# Patient Record
Sex: Female | Born: 1940 | Race: White | Hispanic: No | Smoking: Former smoker
Health system: Southern US, Community
[De-identification: ages and names within clinical notes are randomized; demographics above are authoritative.]

## PROBLEM LIST (undated history)

## (undated) DIAGNOSIS — E785 Hyperlipidemia, unspecified: Secondary | ICD-10-CM

## (undated) DIAGNOSIS — I219 Acute myocardial infarction, unspecified: Secondary | ICD-10-CM

## (undated) DIAGNOSIS — E119 Type 2 diabetes mellitus without complications: Secondary | ICD-10-CM

## (undated) DIAGNOSIS — I10 Essential (primary) hypertension: Secondary | ICD-10-CM

## (undated) HISTORY — DX: Type 2 diabetes mellitus without complications: E11.9

## (undated) HISTORY — DX: Acute myocardial infarction, unspecified: I21.9

## (undated) HISTORY — DX: Essential (primary) hypertension: I10

## (undated) HISTORY — PX: CARDIAC SURGERY: SHX584

## (undated) HISTORY — PX: EYE SURGERY: SHX253

## (undated) HISTORY — DX: Hyperlipidemia, unspecified: E78.5

---

## 2014-08-27 DIAGNOSIS — E785 Hyperlipidemia, unspecified: Secondary | ICD-10-CM | POA: Insufficient documentation

## 2014-08-27 DIAGNOSIS — E1169 Type 2 diabetes mellitus with other specified complication: Secondary | ICD-10-CM | POA: Insufficient documentation

## 2014-08-27 DIAGNOSIS — E782 Mixed hyperlipidemia: Secondary | ICD-10-CM | POA: Insufficient documentation

## 2014-08-27 DIAGNOSIS — E1122 Type 2 diabetes mellitus with diabetic chronic kidney disease: Secondary | ICD-10-CM | POA: Insufficient documentation

## 2014-09-09 DIAGNOSIS — G2581 Restless legs syndrome: Secondary | ICD-10-CM | POA: Insufficient documentation

## 2014-10-05 DIAGNOSIS — H9193 Unspecified hearing loss, bilateral: Secondary | ICD-10-CM | POA: Insufficient documentation

## 2014-10-05 DIAGNOSIS — I2510 Atherosclerotic heart disease of native coronary artery without angina pectoris: Secondary | ICD-10-CM | POA: Insufficient documentation

## 2014-10-05 DIAGNOSIS — K219 Gastro-esophageal reflux disease without esophagitis: Secondary | ICD-10-CM | POA: Insufficient documentation

## 2014-11-10 DIAGNOSIS — Z961 Presence of intraocular lens: Secondary | ICD-10-CM | POA: Insufficient documentation

## 2017-06-14 DIAGNOSIS — I739 Peripheral vascular disease, unspecified: Secondary | ICD-10-CM | POA: Insufficient documentation

## 2017-06-17 DIAGNOSIS — Z951 Presence of aortocoronary bypass graft: Secondary | ICD-10-CM | POA: Insufficient documentation

## 2017-06-19 DIAGNOSIS — I255 Ischemic cardiomyopathy: Secondary | ICD-10-CM | POA: Insufficient documentation

## 2017-06-19 DIAGNOSIS — I700 Atherosclerosis of aorta: Secondary | ICD-10-CM | POA: Insufficient documentation

## 2018-08-12 DIAGNOSIS — Z794 Long term (current) use of insulin: Secondary | ICD-10-CM | POA: Diagnosis not present

## 2018-08-12 DIAGNOSIS — I10 Essential (primary) hypertension: Secondary | ICD-10-CM | POA: Diagnosis not present

## 2018-08-12 DIAGNOSIS — H9193 Unspecified hearing loss, bilateral: Secondary | ICD-10-CM | POA: Diagnosis not present

## 2018-08-12 DIAGNOSIS — E119 Type 2 diabetes mellitus without complications: Secondary | ICD-10-CM | POA: Diagnosis not present

## 2018-10-06 DIAGNOSIS — E119 Type 2 diabetes mellitus without complications: Secondary | ICD-10-CM | POA: Diagnosis not present

## 2018-10-06 DIAGNOSIS — I739 Peripheral vascular disease, unspecified: Secondary | ICD-10-CM | POA: Diagnosis not present

## 2018-10-06 DIAGNOSIS — I2510 Atherosclerotic heart disease of native coronary artery without angina pectoris: Secondary | ICD-10-CM | POA: Diagnosis not present

## 2018-10-06 DIAGNOSIS — Z0000 Encounter for general adult medical examination without abnormal findings: Secondary | ICD-10-CM | POA: Diagnosis not present

## 2018-10-06 DIAGNOSIS — E1159 Type 2 diabetes mellitus with other circulatory complications: Secondary | ICD-10-CM | POA: Diagnosis not present

## 2018-12-30 DIAGNOSIS — Z794 Long term (current) use of insulin: Secondary | ICD-10-CM | POA: Diagnosis not present

## 2018-12-30 DIAGNOSIS — E119 Type 2 diabetes mellitus without complications: Secondary | ICD-10-CM | POA: Diagnosis not present

## 2019-01-06 DIAGNOSIS — I700 Atherosclerosis of aorta: Secondary | ICD-10-CM | POA: Diagnosis not present

## 2019-01-06 DIAGNOSIS — Z0000 Encounter for general adult medical examination without abnormal findings: Secondary | ICD-10-CM | POA: Insufficient documentation

## 2019-01-06 DIAGNOSIS — E1122 Type 2 diabetes mellitus with diabetic chronic kidney disease: Secondary | ICD-10-CM | POA: Diagnosis not present

## 2019-01-06 DIAGNOSIS — I10 Essential (primary) hypertension: Secondary | ICD-10-CM | POA: Diagnosis not present

## 2019-01-06 DIAGNOSIS — I2510 Atherosclerotic heart disease of native coronary artery without angina pectoris: Secondary | ICD-10-CM | POA: Diagnosis not present

## 2019-02-13 DIAGNOSIS — Z961 Presence of intraocular lens: Secondary | ICD-10-CM | POA: Diagnosis not present

## 2019-02-13 DIAGNOSIS — E119 Type 2 diabetes mellitus without complications: Secondary | ICD-10-CM | POA: Diagnosis not present

## 2019-04-15 DIAGNOSIS — H905 Unspecified sensorineural hearing loss: Secondary | ICD-10-CM | POA: Diagnosis not present

## 2019-07-07 ENCOUNTER — Encounter: Payer: Medicare Other | Attending: Physician Assistant | Admitting: Physician Assistant

## 2019-07-07 ENCOUNTER — Other Ambulatory Visit: Payer: Self-pay

## 2019-07-07 DIAGNOSIS — Z8249 Family history of ischemic heart disease and other diseases of the circulatory system: Secondary | ICD-10-CM | POA: Insufficient documentation

## 2019-07-07 DIAGNOSIS — Z833 Family history of diabetes mellitus: Secondary | ICD-10-CM | POA: Diagnosis not present

## 2019-07-07 DIAGNOSIS — Z887 Allergy status to serum and vaccine status: Secondary | ICD-10-CM | POA: Diagnosis not present

## 2019-07-07 DIAGNOSIS — Z87891 Personal history of nicotine dependence: Secondary | ICD-10-CM | POA: Insufficient documentation

## 2019-07-07 DIAGNOSIS — Z8349 Family history of other endocrine, nutritional and metabolic diseases: Secondary | ICD-10-CM | POA: Diagnosis not present

## 2019-07-07 DIAGNOSIS — L89312 Pressure ulcer of right buttock, stage 2: Secondary | ICD-10-CM | POA: Insufficient documentation

## 2019-07-07 DIAGNOSIS — I252 Old myocardial infarction: Secondary | ICD-10-CM | POA: Insufficient documentation

## 2019-07-07 DIAGNOSIS — Z880 Allergy status to penicillin: Secondary | ICD-10-CM | POA: Insufficient documentation

## 2019-07-07 DIAGNOSIS — Z823 Family history of stroke: Secondary | ICD-10-CM | POA: Diagnosis not present

## 2019-07-07 DIAGNOSIS — E11622 Type 2 diabetes mellitus with other skin ulcer: Secondary | ICD-10-CM | POA: Insufficient documentation

## 2019-07-07 DIAGNOSIS — I10 Essential (primary) hypertension: Secondary | ICD-10-CM | POA: Diagnosis not present

## 2019-07-07 DIAGNOSIS — M1990 Unspecified osteoarthritis, unspecified site: Secondary | ICD-10-CM | POA: Insufficient documentation

## 2019-07-07 DIAGNOSIS — E1151 Type 2 diabetes mellitus with diabetic peripheral angiopathy without gangrene: Secondary | ICD-10-CM | POA: Insufficient documentation

## 2019-07-07 DIAGNOSIS — I2510 Atherosclerotic heart disease of native coronary artery without angina pectoris: Secondary | ICD-10-CM | POA: Diagnosis not present

## 2019-07-08 NOTE — Progress Notes (Signed)
MARNAE, MADANI ([id number]) Visit Report for 07/07/2019 Allergy List Details Patient Name: Lindsey Pollard, Lindsey Pollard Date of Service: 07/07/2019 1:15 PM Medical Record Number: [id number] Patient Account Number: [id number] Date of Birth/Sex: 1940/09/15 (79 y.o. F) Treating RN: [name] Primary Care Milon Dethloff: [name] Other Clinician: Referring Maguadalupe Lata: Benjamine Sprague Treating Dandrea Medders/Extender: [name], HOYT Weeks in Treatment: 0 Allergies Active Allergies penicillin diphtheria, pertussis, tetanus vaccine Allergy Notes Electronic Signature(s) Signed: 07/08/2019 4:52:26 PM By: Gretta Cool, BSN, RN, CWS, Kim RN, BSN Entered By: Gretta Cool, BSN, RN, CWS, Kim on 07/07/2019 13:18:27 Lindsey Pollard ([id number]) -------------------------------------------------------------------------------- Arrival Information Details Patient Name: Lindsey Pollard Date of Service: 07/07/2019 1:15 PM Medical Record Number: [id number] Patient Account Number: [id number] Date of Birth/Sex: September 26, 1940 (79 y.o. F) Treating RN: Montey Hora Primary Care Jimi Giza: [name] Other Clinician: Referring Rogelio Waynick: Benjamine Sprague Treating Griffon Herberg/Extender: [name], HOYT Weeks in Treatment: 0 Visit Information Patient Arrived: Ambulatory Arrival Time: 13:08 Accompanied By: self Transfer Assistance: None Patient Identification Verified: Yes Secondary Verification Process Completed: Yes Electronic Signature(s) Signed: 07/07/2019 4:10:34 PM By: Lorine Bears RCP, RRT, CHT Entered By: Lorine Bears on 07/07/2019 13:08:25 Lindsey Pollard ([id number]) -------------------------------------------------------------------------------- Clinic Level of Care Assessment Details Patient Name: Lindsey Pollard Date of Service: 07/07/2019 1:15 PM Medical Record Number: [id number] Patient Account Number: [id number] Date of Birth/Sex: 21-Dec-1940 (79 y.o. F) Treating RN: Montey Hora Primary Care  Paislyn Domenico: [name] Other Clinician: Referring Nikolaj Geraghty: Benjamine Sprague Treating Garwood Wentzell/Extender: [name], HOYT Weeks in Treatment: 0 Clinic Level of Care Assessment Items TOOL 2 Quantity Score []  - Use when only an EandM is performed on the INITIAL visit 0 ASSESSMENTS - Nursing Assessment / Reassessment X - General Physical Exam (combine w/ comprehensive assessment (listed just below) when 1 20 performed on new pt. evals) X- 1 25 Comprehensive Assessment (HX, ROS, Risk Assessments, Wounds Hx, etc.) ASSESSMENTS - Wound and Skin Assessment / Reassessment X - Simple Wound Assessment / Reassessment - one wound 1 5 []  - 0 Complex Wound Assessment / Reassessment - multiple wounds []  - 0 Dermatologic / Skin Assessment (not related to wound area) ASSESSMENTS - Ostomy and/or Continence Assessment and Care []  - Incontinence Assessment and Management 0 []  - 0 Ostomy Care Assessment and Management (repouching, etc.) PROCESS - Coordination of Care X - Simple Patient / Family Education for ongoing care 1 15 []  - 0 Complex (extensive) Patient / Family Education for ongoing care X- 1 10 Staff obtains Programmer, systems, Records, Test Results / Process Orders []  - 0 Staff telephones HHA, Nursing Homes / Clarify orders / etc []  - 0 Routine Transfer to another Facility (non-emergent condition) []  - 0 Routine Hospital Admission (non-emergent condition) X- 1 15 New Admissions / Biomedical engineer / Ordering NPWT, Apligraf, etc. []  - 0 Emergency Hospital Admission (emergent condition) X- 1 10 Simple Discharge Coordination []  - 0 Complex (extensive) Discharge Coordination PROCESS - Special Needs []  - Pediatric / Minor Patient Management 0 []  - 0 Isolation Patient Management CICI, RODRIGES. ([id number]) []  - 0 Hearing / Language / Visual special needs []  - 0 Assessment of Community assistance (transportation, D/C planning, etc.) []  - 0 Additional assistance / Altered mentation []   - 0 Support Surface(s) Assessment (bed, cushion, seat, etc.) INTERVENTIONS - Wound Cleansing / Measurement X - Wound Imaging (photographs - any number of wounds) 1 5 []  - 0 Wound Tracing (instead of photographs) X- 1 5 Simple Wound Measurement - one wound []  - 0 Complex Wound Measurement - multiple  wounds X- 1 5 Simple Wound Cleansing - one wound []  - 0 Complex Wound Cleansing - multiple wounds INTERVENTIONS - Wound Dressings X - Small Wound Dressing one or multiple wounds 1 10 []  - 0 Medium Wound Dressing one or multiple wounds []  - 0 Large Wound Dressing one or multiple wounds []  - 0 Application of Medications - injection INTERVENTIONS - Miscellaneous []  - External ear exam 0 []  - 0 Specimen Collection (cultures, biopsies, blood, body fluids, etc.) []  - 0 Specimen(s) / Culture(s) sent or taken to Lab for analysis []  - 0 Patient Transfer (multiple staff / / Similar devices) []  - 0 Simple Staple / Suture removal (25 or less) []  - 0 Complex Staple / Suture removal (26 or more) []  - 0 Hypo / Hyperglycemic Management (close monitor of Blood Glucose) []  - 0 Ankle / Brachial Index (ABI) - do not check if billed separately Has the patient been seen at the hospital within the last three years: Yes Total Score: 125 Level Of Care: New/Established - Level 4 Electronic Signature(s) Signed: 07/07/2019 4:09:10 PM By: Entered By: on 07/07/2019 13:37:01 ( ) -------------------------------------------------------------------------------- Encounter Discharge Information Details Patient Name: Date of Service: 07/07/2019 1:15 PM Medical Record Number: Patient Account Number: Date of Birth/Sex: 1941/05/08 (79 y.o. F) Treating RN: 09/04/2019 Primary Care Zia Najera: Curtis Sites Other Clinician: Referring Miasia Crabtree: Curtis Sites Treating Whitleigh Garramone/Extender: 09/04/2019, HOYT Weeks  in Treatment: 0 Encounter Discharge Information Items Post Procedure Vitals Discharge Condition: Stable Temperature (F): 98.4 Ambulatory Status: Ambulatory Pulse (bpm): 58 Discharge Destination: Home Respiratory Rate (breaths/min): 16 Transportation: Private Auto Blood Pressure (mmHg): 180/68 Accompanied By: self Schedule Follow-up Appointment: Yes Clinical Summary of Care: Electronic Signature(s) Signed: 07/07/2019 4:09:10 PM By: [id number] Entered By: Lindsey Pollard on 07/07/2019 13:38:22 [id number] ([id number]) -------------------------------------------------------------------------------- Lower Extremity Assessment Details Patient Name: Lindsey Pollard Date of Service: 07/07/2019 1:15 PM Medical Record Number: Curtis Sites Patient Account Number: Einar Crow Date of Birth/Sex: December 23, 1940 (79 y.o. F) Treating RN: 09/04/2019 Primary Care Jodie Cavey: Curtis Sites Other Clinician: Referring Eaven Schwager: Curtis Sites Treating Carle Dargan/Extender: 09/04/2019, HOYT Weeks in Treatment: 0 Electronic Signature(s) Signed: 07/08/2019 4:52:26 PM By: [id number], BSN, RN, CWS, Kim RN, BSN Entered By: Lindsey Pollard, BSN, RN, CWS, Kim on 07/07/2019 13:17:24 [id number] ([id number]) -------------------------------------------------------------------------------- Multi Wound Chart Details Patient Name: Lindsey Pollard Date of Service: 07/07/2019 1:15 PM Medical Record Number: Huel Coventry Patient Account Number: Einar Crow Date of Birth/Sex: [birth year] (79 y.o. F) Treating RN: 07/10/2019 Primary Care Cloyd Ragas: Elliot Gurney Other Clinician: Referring Ariday Brinker: Elliot Gurney Treating Cochise Dinneen/Extender: 09/04/2019, HOYT Weeks in Treatment: 0 Vital Signs Height(in): 59 Pulse(bpm): 58 Weight(lbs): 165 Blood Pressure(mmHg): 180/68 Body Mass Index(BMI): 33 Temperature(F): 98.4 Respiratory Rate 16 (breaths/min): Photos: [N/A:N/A] Wound Location: Right Gluteal fold N/A N/A Wounding Event:  Gradually Appeared N/A N/A Primary Etiology: Pressure Ulcer N/A N/A Date Acquired: 06/05/2019 N/A N/A Weeks of Treatment: 0 N/A N/A Wound Status: Open N/A N/A Measurements L x W x D 0.4x0.3x0.1 N/A N/A (cm) Area (cm) : 0.094 N/A N/A Volume (cm) : 0.009 N/A N/A % Reduction in Area: 0.00% N/A N/A % Reduction in Volume: 0.00% N/A N/A Classification: Category/Stage II N/A N/A Exudate Amount: Medium N/A N/A Exudate Type: Serous N/A N/A Exudate Color: amber N/A N/A Wound Margin: Flat and Intact N/A N/A Granulation Amount: Large (67-100%) N/A N/A Necrotic Amount: None Present (0%) N/A N/A Exposed Structures: Fat Layer (Subcutaneous N/A  N/A Tissue) Exposed: Yes Fascia: No Tendon: No Muscle: No Joint: No Bone: No Epithelialization: None N/A N/A Treatment Notes EUSEBIA, GRULKE ([id number]) Electronic Signature(s) Signed: 07/07/2019 4:09:10 PM By: Curtis Sites Entered By: Curtis Sites on 07/07/2019 13:34:06 Lindsey Pollard ([id number]) -------------------------------------------------------------------------------- Multi-Disciplinary Care Plan Details Patient Name: Lindsey Pollard Date of Service: 07/07/2019 1:15 PM Medical Record Number: [id number] Patient Account Number: [id number] Date of Birth/Sex: 01/08/1941 (79 y.o. F) Treating RN: Curtis Sites Primary Care Katrinka Herbison: Einar Crow Other Clinician: Referring Petrice Beedy: Sung Amabile Treating Tasheika Kitzmiller/Extender: Linwood Dibbles, HOYT Weeks in Treatment: 0 Active Inactive Abuse / Safety / Falls / Self Care Management Nursing Diagnoses: Potential for falls Goals: Patient will remain injury free related to falls Date Initiated: 07/07/2019 Target Resolution Date: 10/03/2019 Goal Status: Active Interventions: Assess fall risk on admission and as needed Notes: Orientation to the Wound Care Program Nursing Diagnoses: Knowledge deficit related to the wound healing center program Goals: Patient/caregiver will verbalize  understanding of the Wound Healing Center Program Date Initiated: 07/07/2019 Target Resolution Date: 10/03/2019 Goal Status: Active Interventions: Provide education on orientation to the wound center Notes: Pressure Nursing Diagnoses: Knowledge deficit related to causes and risk factors for pressure ulcer development Goals: Patient will remain free from development of additional pressure ulcers Date Initiated: 07/07/2019 Target Resolution Date: 10/03/2019 Goal Status: Active Interventions: Assess potential for pressure ulcer upon admission and as needed ADILYNN, BESSEY. ([id number]) Provide education on pressure ulcers Notes: Wound/Skin Impairment Nursing Diagnoses: Impaired tissue integrity Goals: Ulcer/skin breakdown will heal within 14 weeks Date Initiated: 07/07/2019 Target Resolution Date: 10/03/2019 Goal Status: Active Interventions: Assess patient/caregiver ability to obtain necessary supplies Assess patient/caregiver ability to perform ulcer/skin care regimen upon admission and as needed Assess ulceration(s) every visit Notes: Electronic Signature(s) Signed: 07/07/2019 4:09:10 PM By: Curtis Sites Entered By: Curtis Sites on 07/07/2019 13:33:55 Lindsey Pollard ([id number]) -------------------------------------------------------------------------------- Pain Assessment Details Patient Name: Lindsey Pollard Date of Service: 07/07/2019 1:15 PM Medical Record Number: [id number] Patient Account Number: [id number] Date of Birth/Sex: 1941/01/18 (79 y.o. F) Treating RN: Curtis Sites Primary Care Adyan Palau: Einar Crow Other Clinician: Referring Joss Friedel: Sung Amabile Treating Taiquan Campanaro/Extender: Linwood Dibbles, HOYT Weeks in Treatment: 0 Active Problems Location of Pain Severity and Description of Pain Patient Has Paino No Site Locations Pain Management and Medication Current Pain Management: Electronic Signature(s) Signed: 07/07/2019 4:09:10 PM By: Curtis Sites Signed: 07/07/2019 4:10:34 PM By: Dayton Martes RCP, RRT, CHT Entered By: Dayton Martes on 07/07/2019 13:08:37 Lindsey Pollard ([id number]) -------------------------------------------------------------------------------- Patient/Caregiver Education Details Patient Name: Lindsey Pollard Date of Service: 07/07/2019 1:15 PM Medical Record Number: [id number] Patient Account Number: [id number] Date of Birth/Gender: Nov 09, 1940 (79 y.o. F) Treating RN: Curtis Sites Primary Care Physician: Einar Crow Other Clinician: Referring Physician: Sung Amabile Treating Physician/Extender: Skeet Simmer in Treatment: 0 Education Assessment Education Provided To: Patient Education Topics Provided Wound/Skin Impairment: Handouts: Other: wound care as ordered Methods: Demonstration, Explain/Verbal Responses: State content correctly Electronic Signature(s) Signed: 07/07/2019 4:09:10 PM By: Curtis Sites Entered By: Curtis Sites on 07/07/2019 13:37:26 Lindsey Pollard ([id number]) -------------------------------------------------------------------------------- Wound Assessment Details Patient Name: Lindsey Pollard Date of Service: 07/07/2019 1:15 PM Medical Record Number: [id number] Patient Account Number: [id number] Date of Birth/Sex: 1940-12-13 (79 y.o. F) Treating RN: Huel Coventry Primary Care Olga Seyler: Einar Crow Other Clinician: Referring Chanel Mcadams: Sung Amabile Treating Aquiles Ruffini/Extender: Linwood Dibbles, HOYT Weeks in Treatment: 0 Wound Status Wound Number: 1 Primary Etiology: Pressure Ulcer Wound Location: Right Gluteal fold Wound Status: Open  Wounding Event: Gradually Appeared Date Acquired: 06/05/2019 Weeks Of Treatment: 0 Clustered Wound: No Photos Wound Measurements Length: (cm) 0.4 % Reduction in Width: (cm) 0.3 % Reduction in Depth: (cm) 0.1 Epithelializati Area: (cm) 0.094 Tunneling: Volume: (cm) 0.009 Undermining: Area:  0% Volume: 0% on: None No No Wound Description Classification: Category/Stage II Foul Odor Afte Wound Margin: Flat and Intact Slough/Fibrino Exudate Amount: Medium Exudate Type: Serous Exudate Color: amber r Cleansing: No No Wound Bed Granulation Amount: Large (67-100%) Exposed Structure Necrotic Amount: None Present (0%) Fascia Exposed: No Fat Layer (Subcutaneous Tissue) Exposed: Yes Tendon Exposed: No Muscle Exposed: No Joint Exposed: No Bone Exposed: No Treatment Notes ALEINA, BURGIO ([id number]) Wound #1 (Right Gluteal fold) Notes prisma, BFD Electronic Signature(s) Signed: 07/08/2019 4:52:26 PM By: Elliot Gurney, BSN, RN, CWS, Kim RN, BSN Entered By: Elliot Gurney, BSN, RN, CWS, Kim on 07/07/2019 13:17:08 Lindsey Pollard ([id number]) -------------------------------------------------------------------------------- Vitals Details Patient Name: Lindsey Pollard Date of Service: 07/07/2019 1:15 PM Medical Record Number: [id number] Patient Account Number: [id number] Date of Birth/Sex: [birth year] (79 y.o. F) Treating RN: Curtis Sites Primary Care Naylea Wigington: Einar Crow Other Clinician: Referring Shasha Buchbinder: Sung Amabile Treating Amitai Delaughter/Extender: Linwood Dibbles, HOYT Weeks in Treatment: 0 Vital Signs Time Taken: 13:08 Temperature (F): 98.4 Height (in): 59 Pulse (bpm): 58 Source: Stated Respiratory Rate (breaths/min): 16 Weight (lbs): 165 Blood Pressure (mmHg): 180/68 Source: Measured Reference Range: 80 - 120 mg / dl Body Mass Index (BMI): 33.3 Electronic Signature(s) Signed: 07/07/2019 4:10:34 PM By: Dayton Martes RCP, RRT, CHT Entered By: Dayton Martes on 07/07/2019 13:12:31

## 2019-07-08 NOTE — Progress Notes (Signed)
KENEDIE, DIROCCO ([id number]) Visit Report for 07/07/2019 Abuse/Suicide Risk Screen Details Patient Name: Lindsey Pollard, Lindsey Pollard Date of Service: 07/07/2019 1:15 PM Medical Record Number: [id number] Patient Account Number: [id number] Date of Birth/Sex: 07-11-1940 (78 y.o. F) Treating RN: Huel Coventry Primary Care Kaimen Peine: Einar Crow Other Clinician: Referring Tandrea Kommer: Sung Amabile Treating Martise Waddell/Extender: Linwood Dibbles, HOYT Weeks in Treatment: 0 Abuse/Suicide Risk Screen Items Answer ABUSE RISK SCREEN: Has anyone close to you tried to hurt or harm you recentlyo No Do you feel uncomfortable with anyone in your familyo No Has anyone forced you do things that you didnot want to doo No Electronic Signature(s) Signed: 07/08/2019 4:52:26 PM By: Elliot Gurney, BSN, RN, CWS, Kim RN, BSN Entered By: Elliot Gurney, BSN, RN, CWS, Kim on 07/07/2019 13:22:32 Lindsey Pollard ([id number]) -------------------------------------------------------------------------------- Activities of Daily Living Details Patient Name: Lindsey Pollard Date of Service: 07/07/2019 1:15 PM Medical Record Number: [id number] Patient Account Number: [id number] Date of Birth/Sex: [birth year] (78 y.o. F) Treating RN: Huel Coventry Primary Care Elmond Poehlman: Einar Crow Other Clinician: Referring Malasia Torain: Sung Amabile Treating Torrance Frech/Extender: Linwood Dibbles, HOYT Weeks in Treatment: 0 Activities of Daily Living Items Answer Activities of Daily Living (Please select one for each item) Drive Automobile Not Able Take Medications Completely Able Use Telephone Completely Able Care for Appearance Completely Able Use Toilet Completely Able Bath / Shower Completely Able Dress Self Completely Able Feed Self Completely Able Walk Completely Able Get In / Out Bed Completely Able Housework Need Assistance Prepare Meals Completely Able Handle Money Completely Able Shop for Self Completely Able Electronic Signature(s) Signed: 07/08/2019 4:52:26 PM By:  Elliot Gurney, BSN, RN, CWS, Kim RN, BSN Entered By: Elliot Gurney, BSN, RN, CWS, Kim on 07/07/2019 13:22:44 Lindsey Pollard ([id number]) -------------------------------------------------------------------------------- Education Screening Details Patient Name: Lindsey Pollard Date of Service: 07/07/2019 1:15 PM Medical Record Number: [id number] Patient Account Number: [id number] Date of Birth/Sex: December 29, 1940 (78 y.o. F) Treating RN: Huel Coventry Primary Care Keysha Damewood: Einar Crow Other Clinician: Referring Selah Klang: Sung Amabile Treating Davionne Mastrangelo/Extender: Linwood Dibbles, HOYT Weeks in Treatment: 0 Primary Learner Assessed: Patient Learning Preferences/Education Level/Primary Language Learning Preference: Explanation Highest Education Level: Grade School Preferred Language: English Cognitive Barrier Language Barrier: No Translator Needed: No Memory Deficit: No Emotional Barrier: No Cultural/Religious Beliefs Affecting Medical Care: No Physical Barrier Impaired Vision: No Impaired Hearing: No Decreased Hand dexterity: No Knowledge/Comprehension Knowledge Level: High Comprehension Level: High Ability to understand written High instructions: Ability to understand verbal High instructions: Motivation Anxiety Level: Calm Cooperation: Cooperative Education Importance: Acknowledges Need Interest in Health Problems: Asks Questions Perception: Coherent Willingness to Engage in Self- High Management Activities: Readiness to Engage in Self- High Management Activities: Electronic Signature(s) Signed: 07/08/2019 4:52:26 PM By: Elliot Gurney, BSN, RN, CWS, Kim RN, BSN Entered By: Elliot Gurney, BSN, RN, CWS, Kim on 07/07/2019 13:23:01 Lindsey Pollard ([id number]) -------------------------------------------------------------------------------- Fall Risk Assessment Details Patient Name: Lindsey Pollard Date of Service: 07/07/2019 1:15 PM Medical Record Number: [id number] Patient Account Number: [id number] Date of  Birth/Sex: 27-Sep-1940 (78 y.o. F) Treating RN: Huel Coventry Primary Care Veronika Heard: Einar Crow Other Clinician: Referring Celestino Ackerman: Sung Amabile Treating Shaye Elling/Extender: Linwood Dibbles, HOYT Weeks in Treatment: 0 Fall Risk Assessment Items Have you had 2 or more falls in the last 12 monthso 0 No Have you had any fall that resulted in injury in the last 12 monthso 0 No FALLS RISK SCREEN History of falling - immediate or within 3 months 0 No Secondary diagnosis (Do you have 2 or more medical diagnoseso) 0 No Ambulatory aid None/bed  rest/wheelchair/nurse 0 No Crutches/cane/walker 0 No Furniture 0 No Intravenous therapy Access/Saline/Heparin Lock 0 No Gait/Transferring Normal/ bed rest/ wheelchair 0 No Weak (short steps with or without shuffle, stooped but able to lift head while 0 No walking, may seek support from furniture) Impaired (short steps with shuffle, may have difficulty arising from chair, head 0 No down, impaired balance) Mental Status Oriented to own ability 0 No Electronic Signature(s) Signed: 07/08/2019 4:52:26 PM By: Gretta Cool, BSN, RN, CWS, Kim RN, BSN Entered By: Gretta Cool, BSN, RN, CWS, Kim on 07/07/2019 13:23:08 Lindsey Pollard ([id number]) -------------------------------------------------------------------------------- Nutrition Risk Screening Details Patient Name: Lindsey Pollard Date of Service: 07/07/2019 1:15 PM Medical Record Number: [id number] Patient Account Number: [id number] Date of Birth/Sex: 1941-05-17 (78 y.o. F) Treating RN: [name] Primary Care Keylani Perlstein: [name] Other Clinician: Referring Caison Hearn: Benjamine Sprague Treating Talayia Hjort/Extender: [name], HOYT Weeks in Treatment: 0 Height (in): 59 Weight (lbs): 165 Body Mass Index (BMI): 33.3 Nutrition Risk Screening Items Score Screening NUTRITION RISK SCREEN: I have an illness or condition that made me change the kind and/or amount of 0 No food I eat I eat fewer than two meals per day 0  No I eat few fruits and vegetables, or milk products 0 No I have three or more drinks of beer, liquor or wine almost every day 0 No I have tooth or mouth problems that make it hard for me to eat 0 No I don't always have enough money to buy the food I need 0 No I eat alone most of the time 0 No I take three or more different prescribed or over-the-counter drugs a day 0 No Without wanting to, I have lost or gained 10 pounds in the last six months 0 No I am not always physically able to shop, cook and/or feed myself 0 No Nutrition Protocols Good Risk Protocol 0 No interventions needed Moderate Risk Protocol High Risk Proctocol Risk Level: Good Risk Score: 0 Electronic Signature(s) Signed: 07/08/2019 4:52:26 PM By: Gretta Cool, BSN, RN, CWS, Kim RN, BSN Entered By: Gretta Cool, BSN, RN, CWS, Kim on 07/07/2019 13:23:13

## 2019-07-08 NOTE — Progress Notes (Signed)
Lindsey Pollard, Lindsey M. ([id number]) Visit Report for 07/07/2019 Chief Complaint Document Details Patient Name: Lindsey Pollard, Lindsey M. Date of Service: 07/07/2019 1:15 PM Medical Record Number: [id number] Patient Account Number: [id number] Date of Birth/Sex: December 22, 1940 (78 y.o. F) Treating RN: Curtis Sitesorthy, Joanna Primary Care Provider: Einar CrowAnderson, Marshall Other Clinician: Referring Provider: Sung AmabileSAKAI, ISAMI Treating Provider/Extender: Linwood DibblesSTONE III, Kingsley Herandez Weeks in Treatment: 0 Information Obtained from: Patient Chief Complaint Right gluteal fold pressure ulcer Electronic Signature(s) Signed: 07/07/2019 1:39:34 PM By: Lenda KelpStone III, Dessiree Sze PA-C Previous Signature: 07/07/2019 1:25:55 PM Version By: Lenda KelpStone III, Lateya Dauria PA-C Entered By: Lenda KelpStone III, Acacia Latorre on 07/07/2019 13:39:34 Lindsey Pollard, Lindsey M. ([id number]) -------------------------------------------------------------------------------- Debridement Details Patient Name: Lindsey Pollard, Lindsey M. Date of Service: 07/07/2019 1:15 PM Medical Record Number: [id number] Patient Account Number: [id number] Date of Birth/Sex: December 22, 1940 (78 y.o. F) Treating RN: Curtis Sitesorthy, Joanna Primary Care Provider: Einar CrowAnderson, Marshall Other Clinician: Referring Provider: Sung AmabileSAKAI, ISAMI Treating Provider/Extender: Linwood DibblesSTONE III, Jupiter Kabir Weeks in Treatment: 0 Debridement Performed for Wound #1 Right Gluteal fold Assessment: Performed By: Physician [name], Daleigh Pollinger E., PA-C Debridement Type: Chemical/Enzymatic/Mechanical Agent Used: saline and gauze Level of Consciousness (Pre- Awake and Alert procedure): Pre-procedure Verification/Time Yes - 13:35 Out Taken: Start Time: 13:35 Pain Control: Lidocaine 4% Topical Solution Instrument: Other : saline and gauze Bleeding: None End Time: 13:36 Procedural Pain: 0 Post Procedural Pain: 0 Response to Treatment: Procedure was tolerated well Level of Consciousness Awake and Alert (Post-procedure): Post Debridement Measurements of Total Wound Length: (cm) 0.4 Stage:  Category/Stage II Width: (cm) 0.3 Depth: (cm) 0.1 Volume: (cm) 0.009 Character of Wound/Ulcer Post Improved Debridement: Post Procedure Diagnosis Same as Pre-procedure Electronic Signature(s) Signed: 07/07/2019 4:01:12 PM By: Lenda KelpStone III, Lelynd Poer PA-C Signed: 07/07/2019 4:09:10 PM By: Curtis Sitesorthy, Joanna Entered By: Curtis Sitesorthy, Joanna on 07/07/2019 13:35:36 Lindsey Pollard, Lindsey M. ([id number]) -------------------------------------------------------------------------------- HPI Details Patient Name: Lindsey Pollard, Lindsey M. Date of Service: 07/07/2019 1:15 PM Medical Record Number: [id number] Patient Account Number: [id number] Date of Birth/Sex: December 22, 1940 (78 y.o. F) Treating RN: Curtis Sitesorthy, Joanna Primary Care Provider: Einar CrowAnderson, Marshall Other Clinician: Referring Provider: Sung AmabileSAKAI, ISAMI Treating Provider/Extender: Linwood DibblesSTONE III, Geneal Huebert Weeks in Treatment: 0 History of Present Illness HPI Description: 05/06/2020 on evaluation today patient presents for initial inspection here in our clinic concerning an issue that she is having with a wound in the right gluteal fold region. She has been tolerating some dressing changes she has been performing at home mainly she been putting a little bit of ointment on this and keeping it covered. She notes that this has been present for about 2 months and she is noted some improvement compared to where it started but unfortunately not a significant amount to where she is pleased with how things have gone. Nonetheless she did come in to be seen in order to see if there is anything we can do to aid in helping the area to heal more effectively and quickly. Fortunately there is no signs of active infection at this time. No fevers, chills, nausea, vomiting, or diarrhea. She has diabetic and on 05/04/19 her most recent reading of 7.9. She also has a history of heart disease of note. She also has hypertension. Her blood pressure is elevated today as well. She follows up with her primary care provider  on a regular basis in regard to this. Electronic Signature(s) Signed: 07/07/2019 2:41:22 PM By: Lenda KelpStone III, Honesty Menta PA-C Previous Signature: 07/07/2019 1:40:48 PM Version By: Lenda KelpStone III, Bassam Dresch PA-C Entered By: Lenda KelpStone III, Parisha Beaulac on 07/07/2019 14:41:22 Lindsey Pollard, Lindsey M. ([id number]) -------------------------------------------------------------------------------- Physical Exam Details Patient Name: Lindsey Pollard, Lindsey  M. Date of Service: 07/07/2019 1:15 PM Medical Record Number: [id number] Patient Account Number: [id number] Date of Birth/Sex: 1940/12/08 (78 y.o. F) Treating RN: Curtis Sites Primary Care Provider: Einar Crow Other Clinician: Referring Provider: Sung Amabile Treating Provider/Extender: Linwood Dibbles, Lajoya Dombek Weeks in Treatment: 0 Constitutional patient is hypertensive.. pulse regular and within target range for patient.Marland Kitchen respirations regular, non-labored and within target range for patient.Marland Kitchen temperature within target range for patient.. Well-nourished and well-hydrated in no acute distress. Eyes conjunctiva clear no eyelid edema noted. pupils equal round and reactive to light and accommodation. Ears, Nose, Mouth, and Throat no gross abnormality of ear auricles or external auditory canals. normal hearing noted during conversation. mucus membranes moist. Respiratory normal breathing without difficulty. Cardiovascular 2+ dorsalis pedis/posterior tibialis pulses. no clubbing, cyanosis, significant edema, <3 sec cap refill. Gastrointestinal (GI) soft, non-tender, non-distended, +BS. no ventral hernia noted. Musculoskeletal normal gait and posture. no significant deformity or arthritic changes, no loss or range of motion, no clubbing. Psychiatric this patient is able to make decisions and demonstrates good insight into disease process. Alert and Oriented x 3. pleasant and cooperative. Notes Upon inspection today patient's wound bed actually showed signs of fairly good granulation. There does  not appear to be any evidence of active infection at this time which is good news. Unfortunately the patient's blood pressure was quite elevated at 180/68. Nonetheless I do believe that this is something that she needs to continue to follow with her primary care provider they have been working she tells me to get this under control. Nonetheless obviously they need to definitely make this a priority and focus. I do believe that is the case already but nonetheless that was reiterated to the patient as well today. Electronic Signature(s) Signed: 07/07/2019 1:41:32 PM By: Lenda Kelp PA-C Entered By: Lenda Kelp on 07/07/2019 13:41:32 Lindsey Pollard ([id number]) -------------------------------------------------------------------------------- Physician Orders Details Patient Name: Lindsey Pollard Date of Service: 07/07/2019 1:15 PM Medical Record Number: [id number] Patient Account Number: [id number] Date of Birth/Sex: 09-30-1940 (78 y.o. F) Treating RN: Curtis Sites Primary Care Provider: Einar Crow Other Clinician: Referring Provider: Sung Amabile Treating Provider/Extender: Linwood Dibbles, Phuc Kluttz Weeks in Treatment: 0 Verbal / Phone Orders: No Diagnosis Coding ICD-10 Coding Code Description L89.312 Pressure ulcer of right buttock, stage 2 E11.622 Type 2 diabetes mellitus with other skin ulcer I10 Essential (primary) hypertension I25.10 Atherosclerotic heart disease of native coronary artery without angina pectoris Wound Cleansing Wound #1 Right Gluteal fold o Clean wound with Normal Saline. o Dial antibacterial soap, wash wounds, rinse and pat dry prior to dressing wounds o May Shower, gently pat wound dry prior to applying new dressing. Skin Barriers/Peri-Wound Care Wound #1 Right Gluteal fold o Skin Prep Primary Wound Dressing Wound #1 Right Gluteal fold o Silver Collagen Secondary Dressing Wound #1 Right Gluteal fold o Boardered Foam Dressing Dressing  Change Frequency Wound #1 Right Gluteal fold o Change dressing every other day. Follow-up Appointments o Return Appointment in 1 week. Off-Loading Wound #1 Right Gluteal fold o Turn and reposition every 2 hours Electronic Signature(s) Signed: 07/07/2019 4:01:12 PM By: Lenda Kelp PA-C Signed: 07/07/2019 4:09:10 PM By: Leontine Locket ([id number]) Entered By: Curtis Sites on 07/07/2019 13:36:37 Lindsey Pollard ([id number]) -------------------------------------------------------------------------------- Problem List Details Patient Name: Lindsey Pollard Date of Service: 07/07/2019 1:15 PM Medical Record Number: [id number] Patient Account Number: [id number] Date of Birth/Sex: 14-Jul-1940 (78 y.o. F) Treating RN: Curtis Sites Primary Care Provider: Einar Crow Other Clinician: Referring  Provider: Sung AmabileSAKAI, ISAMI Treating Provider/Extender: Linwood DibblesSTONE III, Vencil Basnett Weeks in Treatment: 0 Active Problems ICD-10 Evaluated Encounter Code Description Active Date Today Diagnosis L89.312 Pressure ulcer of right buttock, stage 2 07/07/2019 No Yes E11.622 Type 2 diabetes mellitus with other skin ulcer 07/07/2019 No Yes I10 Essential (primary) hypertension 07/07/2019 No Yes I25.10 Atherosclerotic heart disease of native coronary artery 07/07/2019 No Yes without angina pectoris Inactive Problems Resolved Problems Electronic Signature(s) Signed: 07/07/2019 1:25:20 PM By: Lenda KelpStone III, Channah Godeaux PA-C Entered By: Lenda KelpStone III, Shaneta Cervenka on 07/07/2019 13:25:20 Lindsey Pollard, Libbie M. ([id number]) -------------------------------------------------------------------------------- Progress Note Details Patient Name: Lindsey Pollard, Shelbi M. Date of Service: 07/07/2019 1:15 PM Medical Record Number: [id number] Patient Account Number: [id number] Date of Birth/Sex: 1940-12-01 (78 y.o. F) Treating RN: Curtis Sitesorthy, Joanna Primary Care Provider: Einar CrowAnderson, Marshall Other Clinician: Referring Provider: Sung AmabileSAKAI, ISAMI Treating  Provider/Extender: Linwood DibblesSTONE III, Antonea Gaut Weeks in Treatment: 0 Subjective Chief Complaint Information obtained from Patient Right gluteal fold pressure ulcer History of Present Illness (HPI) 05/06/2020 on evaluation today patient presents for initial inspection here in our clinic concerning an issue that she is having with a wound in the right gluteal fold region. She has been tolerating some dressing changes she has been performing at home mainly she been putting a little bit of ointment on this and keeping it covered. She notes that this has been present for about 2 months and she is noted some improvement compared to where it started but unfortunately not a significant amount to where she is pleased with how things have gone. Nonetheless she did come in to be seen in order to see if there is anything we can do to aid in helping the area to heal more effectively and quickly. Fortunately there is no signs of active infection at this time. No fevers, chills, nausea, vomiting, or diarrhea. She has diabetic and on 05/04/19 her most recent reading of 7.9. She also has a history of heart disease of note. She also has hypertension. Her blood pressure is elevated today as well. She follows up with her primary care provider on a regular basis in regard to this. Patient History Information obtained from Patient. Allergies penicillin, diphtheria, pertussis, tetanus vaccine Family History Diabetes - Father, Heart Disease - Father,Mother, Hypertension - Father,Mother, Stroke - Father,Siblings, Thyroid Problems - Mother, No family history of Cancer, Hereditary Spherocytosis, Kidney Disease, Lung Disease, Seizures, Tuberculosis. Social History Former smoker - 20 years, Marital Status - Divorced, Alcohol Use - Never, Drug Use - No History, Caffeine Use - Never. Medical History Eyes Patient has history of Cataracts - removed Denies history of Glaucoma, Optic Neuritis Cardiovascular Patient has history of  Hypertension, Myocardial Infarction Denies history of Angina, Arrhythmia, Congestive Heart Failure, Coronary Artery Disease, Deep Vein Thrombosis, Hypotension, Peripheral Arterial Disease, Peripheral Venous Disease, Phlebitis, Vasculitis Endocrine Patient has history of Type II Diabetes Denies history of Type I Diabetes Integumentary (Skin) Denies history of History of Burn, History of pressure wounds Musculoskeletal Patient has history of Osteoarthritis Lindsey Pollard, Timya M. ([id number]) Denies history of Gout, Rheumatoid Arthritis, Osteomyelitis Patient is treated with Insulin. Review of Systems (ROS) Constitutional Symptoms (General Health) Denies complaints or symptoms of Fatigue, Fever, Chills, Marked Weight Change. Eyes Denies complaints or symptoms of Dry Eyes, Vision Changes, Glasses / Contacts. Ear/Nose/Mouth/Throat Denies complaints or symptoms of Difficult clearing ears, Sinusitis. Hematologic/Lymphatic Denies complaints or symptoms of Bleeding / Clotting Disorders, Human Immunodeficiency Virus. Respiratory Denies complaints or symptoms of Chronic or frequent coughs, Shortness of Breath. Cardiovascular Denies complaints or symptoms of Chest pain,  LE edema. Gastrointestinal Denies complaints or symptoms of Frequent diarrhea, Nausea, Vomiting. Genitourinary Denies complaints or symptoms of Kidney failure/ Dialysis, Incontinence/dribbling. Immunological Denies complaints or symptoms of Hives, Itching. Integumentary (Skin) Complains or has symptoms of Wounds. Denies complaints or symptoms of Bleeding or bruising tendency, Breakdown, Swelling. Musculoskeletal Denies complaints or symptoms of Muscle Pain, Muscle Weakness. Neurologic Denies complaints or symptoms of Numbness/parasthesias, Focal/Weakness. Psychiatric Denies complaints or symptoms of Anxiety, Claustrophobia. Objective Constitutional patient is hypertensive.. pulse regular and within target range for patient.Marland Kitchen  respirations regular, non-labored and within target range for patient.Marland Kitchen temperature within target range for patient.. Well-nourished and well-hydrated in no acute distress. Vitals Time Taken: 1:08 PM, Height: 59 in, Source: Stated, Weight: 165 lbs, Source: Measured, BMI: 33.3, Temperature: 98.4 F, Pulse: 58 bpm, Respiratory Rate: 16 breaths/min, Blood Pressure: 180/68 mmHg. Eyes conjunctiva clear no eyelid edema noted. pupils equal round and reactive to light and accommodation. Ears, Nose, Mouth, and Throat no gross abnormality of ear auricles or external auditory canals. normal hearing noted during conversation. mucus membranes moist. Respiratory Lindsey Pollard, Jazzmen M. ([id number]) normal breathing without difficulty. Cardiovascular 2+ dorsalis pedis/posterior tibialis pulses. no clubbing, cyanosis, significant edema, Gastrointestinal (GI) soft, non-tender, non-distended, +BS. no ventral hernia noted. Musculoskeletal normal gait and posture. no significant deformity or arthritic changes, no loss or range of motion, no clubbing. Psychiatric this patient is able to make decisions and demonstrates good insight into disease process. Alert and Oriented x 3. pleasant and cooperative. General Notes: Upon inspection today patient's wound bed actually showed signs of fairly good granulation. There does not appear to be any evidence of active infection at this time which is good news. Unfortunately the patient's blood pressure was quite elevated at 180/68. Nonetheless I do believe that this is something that she needs to continue to follow with her primary care provider they have been working she tells me to get this under control. Nonetheless obviously they need to definitely make this a priority and focus. I do believe that is the case already but nonetheless that was reiterated to the patient as well today. Integumentary (Hair, Skin) Wound #1 status is Open. Original cause of wound was Gradually  Appeared. The wound is located on the Right Gluteal fold. The wound measures 0.4cm length x 0.3cm width x 0.1cm depth; 0.094cm^2 area and 0.009cm^3 volume. There is Fat Layer (Subcutaneous Tissue) Exposed exposed. There is no tunneling or undermining noted. There is a medium amount of serous drainage noted. The wound margin is flat and intact. There is large (67-100%) granulation within the wound bed. There is no necrotic tissue within the wound bed. Assessment Active Problems ICD-10 Pressure ulcer of right buttock, stage 2 Type 2 diabetes mellitus with other skin ulcer Essential (primary) hypertension Atherosclerotic heart disease of native coronary artery without angina pectoris Procedures Wound #1 Pre-procedure diagnosis of Wound #1 is a Pressure Ulcer located on the Right Gluteal fold . There was a Chemical/Enzymatic/Mechanical debridement performed by [name], Alawna Graybeal E., PA-C. With the following instrument(s): saline and gauze after achieving pain control using Lidocaine 4% Topical Solution. Other agent used was saline and gauze. A time out was conducted at 13:35, prior to the start of the procedure. There was no bleeding. The procedure was tolerated well with a pain level of 0 throughout and a pain level of 0 following the procedure. Post Debridement Measurements: 0.4cm length x 0.3cm width x 0.1cm depth; 0.009cm^3 volume. Post debridement Stage noted as Category/Stage II. Character of Wound/Ulcer Post Debridement is  improved. Post procedure Diagnosis Wound #1: Same as Pre-Procedure JEYMI, HEPP. ([id number]) Plan Wound Cleansing: Wound #1 Right Gluteal fold: Clean wound with Normal Saline. Dial antibacterial soap, wash wounds, rinse and pat dry prior to dressing wounds May Shower, gently pat wound dry prior to applying new dressing. Skin Barriers/Peri-Wound Care: Wound #1 Right Gluteal fold: Skin Prep Primary Wound Dressing: Wound #1 Right Gluteal fold: Silver  Collagen Secondary Dressing: Wound #1 Right Gluteal fold: Boardered Foam Dressing Dressing Change Frequency: Wound #1 Right Gluteal fold: Change dressing every other day. Follow-up Appointments: Return Appointment in 1 week. Off-Loading: Wound #1 Right Gluteal fold: Turn and reposition every 2 hours 1. My suggestion at this time is can be that we go ahead and initiate treatment with a silver collagen dressing I think this is probably good to be the best option for her at this point and the patient is in agreement with plan. 2. I would recommend as well that we use a border foam dressing over top I think that this will help aid in padding and relieving some of the pressure and friction to the region. 3. I do recommend appropriate offloading she needs to not sit in any 1 position for too long she tells me she has not been especially in light of the fact that this causes pain. We will see patient back for reevaluation in 1 week here in the clinic. If anything worsens or changes patient will contact our office for additional recommendations. Electronic Signature(s) Signed: 07/07/2019 2:41:37 PM By: Worthy Keeler PA-C Previous Signature: 07/07/2019 1:42:08 PM Version By: Worthy Keeler PA-C Entered By: Worthy Keeler on 07/07/2019 14:41:36 Lindsey Pollard ([id number]) -------------------------------------------------------------------------------- ROS/PFSH Details Patient Name: Lindsey Pollard Date of Service: 07/07/2019 1:15 PM Medical Record Number: [id number] Patient Account Number: [id number] Date of Birth/Sex: Jun 06, 1941 (78 y.o. F) Treating RN: [name] Primary Care Provider: [name] Other Clinician: Referring Provider: Benjamine Sprague Treating Provider/Extender: [name], Aldin Drees Weeks in Treatment: 0 Information Obtained From Patient Constitutional Symptoms (General Health) Complaints and Symptoms: Negative for: Fatigue; Fever; Chills; Marked Weight  Change Eyes Complaints and Symptoms: Negative for: Dry Eyes; Vision Changes; Glasses / Contacts Medical History: Positive for: Cataracts - removed Negative for: Glaucoma; Optic Neuritis Ear/Nose/Mouth/Throat Complaints and Symptoms: Negative for: Difficult clearing ears; Sinusitis Hematologic/Lymphatic Complaints and Symptoms: Negative for: Bleeding / Clotting Disorders; Human Immunodeficiency Virus Respiratory Complaints and Symptoms: Negative for: Chronic or frequent coughs; Shortness of Breath Cardiovascular Complaints and Symptoms: Negative for: Chest pain; LE edema Medical History: Positive for: Hypertension; Myocardial Infarction Negative for: Angina; Arrhythmia; Congestive Heart Failure; Coronary Artery Disease; Deep Vein Thrombosis; Hypotension; Peripheral Arterial Disease; Peripheral Venous Disease; Phlebitis; Vasculitis Gastrointestinal Complaints and Symptoms: Negative for: Frequent diarrhea; Nausea; Vomiting Genitourinary DEYSHA, CARTIER. ([id number]) Complaints and Symptoms: Negative for: Kidney failure/ Dialysis; Incontinence/dribbling Immunological Complaints and Symptoms: Negative for: Hives; Itching Integumentary (Skin) Complaints and Symptoms: Positive for: Wounds Negative for: Bleeding or bruising tendency; Breakdown; Swelling Medical History: Negative for: History of Burn; History of pressure wounds Musculoskeletal Complaints and Symptoms: Negative for: Muscle Pain; Muscle Weakness Medical History: Positive for: Osteoarthritis Negative for: Gout; Rheumatoid Arthritis; Osteomyelitis Neurologic Complaints and Symptoms: Negative for: Numbness/parasthesias; Focal/Weakness Psychiatric Complaints and Symptoms: Negative for: Anxiety; Claustrophobia Endocrine Medical History: Positive for: Type II Diabetes Negative for: Type I Diabetes Time with diabetes: 10 Treated with: Insulin Oncologic HBO Extended History  Items Eyes: Cataracts Immunizations Pneumococcal Vaccine: Received Pneumococcal Vaccination: Yes Implantable Devices None Family and Social History  JENNILEE, DEMARCO ([id number]) Cancer: No; Diabetes: Yes - Father; Heart Disease: Yes - Father,Mother; Hereditary Spherocytosis: No; Hypertension: Yes - Father,Mother; Kidney Disease: No; Lung Disease: No; Seizures: No; Stroke: Yes - Father,Siblings; Thyroid Problems: Yes - Mother; Tuberculosis: No; Former smoker - 20 years; Marital Status - Divorced; Alcohol Use: Never; Drug Use: No History; Caffeine Use: Never; Financial Concerns: No; Food, Clothing or Shelter Needs: No; Support System Lacking: No; Transportation Concerns: No Electronic Signature(s) Signed: 07/07/2019 4:01:12 PM By: Lenda Kelp PA-C Signed: 07/08/2019 4:52:26 PM By: Elliot Gurney, BSN, RN, CWS, Kim RN, BSN Entered By: Elliot Gurney, BSN, RN, CWS, Kim on 07/07/2019 13:22:22 Lindsey Pollard ([id number]) -------------------------------------------------------------------------------- SuperBill Details Patient Name: Lindsey Pollard Date of Service: 07/07/2019 Medical Record Number: [id number] Patient Account Number: [id number] Date of Birth/Sex: 1941-01-13 (78 y.o. F) Treating RN: Curtis Sites Primary Care Provider: Einar Crow Other Clinician: Referring Provider: Sung Amabile Treating Provider/Extender: Linwood Dibbles, Yuji Walth Weeks in Treatment: 0 Diagnosis Coding ICD-10 Codes Code Description [phone] Pressure ulcer of right buttock, stage 2 E11.622 Type 2 diabetes mellitus with other skin ulcer I10 Essential (primary) hypertension I25.10 Atherosclerotic heart disease of native coronary artery without angina pectoris Facility Procedures CPT4 Code: [id number] Description: 99214 - WOUND CARE VISIT-LEV 4 EST PT Modifier: Quantity: 1 Physician Procedures CPT4 Code Description: [id number] WC PHYS LEVEL 3 o NEW PT ICD-10 Diagnosis Description L89.312 Pressure ulcer of right buttock, stage 2  E11.622 Type 2 diabetes mellitus with other skin ulcer I10 Essential (primary) hypertension I25.10 Atherosclerotic heart  disease of native coronary artery wit Modifier: hout angina pectori Quantity: 1 s Electronic Signature(s) Signed: 07/07/2019 1:42:39 PM By: Lenda Kelp PA-C Entered By: Lenda Kelp on 07/07/2019 13:42:39

## 2019-07-14 ENCOUNTER — Ambulatory Visit: Payer: Medicare Other | Admitting: Physician Assistant

## 2019-07-17 ENCOUNTER — Other Ambulatory Visit: Payer: Self-pay

## 2019-07-17 ENCOUNTER — Encounter: Payer: Medicare Other | Admitting: Physician Assistant

## 2019-07-17 DIAGNOSIS — E11622 Type 2 diabetes mellitus with other skin ulcer: Secondary | ICD-10-CM | POA: Diagnosis not present

## 2019-07-17 NOTE — Progress Notes (Addendum)
Lindsey Pollard, Lindsey Pollard ([id number]) Visit Report for 07/17/2019 Chief Complaint Document Details Patient Name: Lindsey, Pollard Date of Service: 07/17/2019 12:45 PM Medical Record Number: [id number] Patient Account Number: [id number] Date of Birth/Sex: 03-08-1941 (78 y.o. F) Treating RN: [name] Primary Care Provider: [name] Other Clinician: Referring Provider: [name] Treating Provider/Extender: [name], [name] Weeks in Treatment: 1 Information Obtained from: Patient Chief Complaint Right gluteal fold pressure ulcer Electronic Signature(s) Signed: 07/17/2019 12:59:57 PM By: Worthy Keeler PA-C Entered By: Worthy Keeler on 07/17/2019 12:59:57 Lindsey Pollard ([id number]) -------------------------------------------------------------------------------- HPI Details Patient Name: Lindsey Pollard Date of Service: 07/17/2019 12:45 PM Medical Record Number: [id number] Patient Account Number: [id number] Date of Birth/Sex: [birth year] (78 y.o. F) Treating RN: [name] Primary Care Provider: [name] Other Clinician: Referring Provider: [name] Treating Provider/Extender: [name], [name] Weeks in Treatment: 1 History of Present Illness HPI Description: 05/06/2020 on evaluation today patient presents for initial inspection here in our clinic concerning an issue that she is having with a wound in the right gluteal fold region. She has been tolerating some dressing changes she has been performing at home mainly she been putting a little bit of ointment on this and keeping it covered. She notes that this has been present for about 2 months and she is noted some improvement compared to where it started but unfortunately not a significant amount to where she is pleased with how things have gone. Nonetheless she did come in to be seen in order to see if there is anything we can do to aid in helping the area to heal more effectively and quickly. Fortunately there is  no signs of active infection at this time. No fevers, chills, nausea, vomiting, or diarrhea. She has diabetic and on 05/04/19 her most recent reading of 7.9. She also has a history of heart disease of note. She also has hypertension. Her blood pressure is elevated today as well. She follows up with her primary care provider on a regular basis in regard to this. 07/17/2019 on evaluation today patient appears to be doing well with regard to her wound at this time. She has been tolerating the dressing changes without complication. Fortunately she is able to do this herself and seems to be doing quite well. There does not appear to be any signs of active infection at this time which is good news. No fevers, chills, nausea, vomiting, or diarrhea. Electronic Signature(s) Signed: 07/17/2019 1:14:47 PM By: Worthy Keeler PA-C Entered By: Worthy Keeler on 07/17/2019 13:14:47 Lindsey Pollard ([id number]) -------------------------------------------------------------------------------- Physical Exam Details Patient Name: Lindsey Pollard Date of Service: 07/17/2019 12:45 PM Medical Record Number: [id number] Patient Account Number: [id number] Date of Birth/Sex: [birth year] (78 y.o. F) Treating RN: [name] Primary Care Provider: [name] Other Clinician: Referring Provider: [name] Treating Provider/Extender: [name], [name] Constitutional Well-nourished and well-hydrated in no acute distress. Respiratory normal breathing without difficulty. Psychiatric this patient is able to make decisions and demonstrates good insight into disease process. Alert and Oriented x 3. pleasant and cooperative. Notes Patient's wound bed showed signs of good epithelization and granulation she seems to be measuring much smaller than previous I am very pleased in this regard. Overall I think she is headed in the correct direction which is great news. Electronic  Signature(s) Signed: 07/17/2019 1:15:03 PM By: Worthy Keeler PA-C Entered By: Worthy Keeler on 07/17/2019 13:15:02 Lindsey Pollard ([id number]) -------------------------------------------------------------------------------- Physician Orders Details Patient  Name: Lindsey Pollard, Lindsey Pollard Date of Service: 07/17/2019 12:45 PM Medical Record Number: [id number] Patient Account Number: [id number] Date of Birth/Sex: 1941-01-18 (78 y.o. 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 o Dial antibacterial soap, wash wounds, rinse and pat dry prior to dressing wounds o May Shower, gently pat wound dry prior to applying new dressing. Skin Barriers/Peri-Wound Care Wound #1 Right Gluteal fold o Skin Prep Primary Wound Dressing Wound #1 Right Gluteal fold o Silver Collagen Secondary Dressing Wound #1 Right Gluteal fold o Boardered Foam Dressing Dressing Change Frequency Wound #1 Right Gluteal fold o Change dressing every other day. Follow-up Appointments o Return Appointment in 1 week. Off-Loading Wound #1 Right Gluteal fold o Turn and reposition every 2 hours Electronic Signature(s) Signed: 07/17/2019 1:21:59 PM By: Lenda Kelp PA-C Signed: 07/20/2019 12:59:59 PM By: Althia Forts ([id number]) Entered By: Rodell Perna on 07/17/2019 13:03:34 Lindsey Pollard  ([id number]) -------------------------------------------------------------------------------- Problem List Details Patient Name: Lindsey Pollard Date of Service: 07/17/2019 12:45 PM Medical Record Number: [id number] Patient Account Number: [id number] Date of Birth/Sex: Feb 22, 1941 (78 y.o. F) Treating RN: Huel Coventry Primary Care Provider: Einar Crow Other Clinician: Referring Provider: Einar Crow Treating Provider/Extender: Linwood Dibbles, Alka Falwell Weeks in Treatment: 1 Active Problems ICD-10 Evaluated Encounter Code Description Active Date Today Diagnosis L89.312 Pressure ulcer of right buttock, stage 2 07/07/2019 No Yes E11.622 Type 2 diabetes mellitus with other skin ulcer 07/07/2019 No Yes I10 Essential (primary) hypertension 07/07/2019 No Yes I25.10 Atherosclerotic heart disease of native coronary artery 07/07/2019 No Yes without angina pectoris Inactive Problems Resolved Problems Electronic Signature(s) Signed: 07/17/2019 12:59:50 PM By: Lenda Kelp PA-C Entered By: Lenda Kelp on 07/17/2019 12:59:50 Lindsey Pollard ([id number]) -------------------------------------------------------------------------------- Progress Note Details Patient Name: Lindsey Pollard Date of Service: 07/17/2019 12:45 PM Medical Record Number: [id number] Patient Account Number: [id number] Date of Birth/Sex: January 06, 1941 (78 y.o. F) Treating RN: Huel Coventry Primary Care Provider: Einar Crow Other Clinician: Referring Provider: Einar Crow Treating Provider/Extender: Linwood Dibbles, Daphnee Preiss Weeks in Treatment: 1 Subjective Chief Complaint Information obtained from Patient Right gluteal fold pressure ulcer History of Present Illness (HPI) 05/06/2020 on evaluation today patient presents for initial inspection here in our clinic concerning an issue that she is having with a wound in the right gluteal fold region. She has been tolerating some dressing changes she has been performing at home  mainly she been putting a little bit of ointment on this and keeping it covered. She notes that this has been present for about 2 months and she is noted some improvement compared to where it started but unfortunately not a significant amount to where she is pleased with how things have gone. Nonetheless she did come in to be seen in order to see if there is anything we can do to aid in helping the area to heal more effectively and quickly. Fortunately there is no signs of active infection at this time. No fevers, chills, nausea, vomiting, or diarrhea. She has diabetic and on 05/04/19 her most recent reading of 7.9. She also has  a history of heart disease of note. She also has hypertension. Her blood pressure is elevated today as well. She follows up with her primary care provider on a regular basis in regard to this. 07/17/2019 on evaluation today patient appears to be doing well with regard to her wound at this time. She has been tolerating the dressing changes without complication. Fortunately she is able to do this herself and seems to be doing quite well. There does not appear to be any signs of active infection at this time which is good news. No fevers, chills, nausea, vomiting, or diarrhea. Objective Constitutional Well-nourished and well-hydrated in no acute distress. Vitals Time Taken: 12:45 PM, Height: 59 in, Weight: 165 lbs, BMI: 33.3, Temperature: 98.3 F, Pulse: 61 bpm, Respiratory Rate: 16 breaths/min, Blood Pressure: 140/58 mmHg. Respiratory normal breathing without difficulty. Psychiatric this patient is able to make decisions and demonstrates good insight into disease process. Alert and Oriented x 3. pleasant and cooperative. General Notes: Patient's wound bed showed signs of good epithelization and granulation she seems to be measuring much Pollard, Lindsey M. ([id number]) smaller than previous I am very pleased in this regard. Overall I think she is headed in the correct  direction which is great news. Integumentary (Hair, Skin) Wound #1 status is Open. Original cause of wound was Gradually Appeared. The wound is located on the Right Gluteal fold. The wound measures 0.2cm length x 0.2cm width x 0.1cm depth; 0.031cm^2 area and 0.003cm^3 volume. There is Fat Layer (Subcutaneous Tissue) Exposed exposed. There is no tunneling or undermining noted. There is a medium amount of sanguinous drainage noted. The wound margin is flat and intact. There is no granulation within the wound bed. There is no necrotic tissue within the wound bed. Assessment Active Problems ICD-10 Pressure ulcer of right buttock, stage 2 Type 2 diabetes mellitus with other skin ulcer Essential (primary) hypertension Atherosclerotic heart disease of native coronary artery without angina pectoris Plan Wound Cleansing: Wound #1 Right Gluteal fold: Clean wound with Normal Saline. Dial antibacterial soap, wash wounds, rinse and pat dry prior to dressing wounds May Shower, gently pat wound dry prior to applying new dressing. Skin Barriers/Peri-Wound Care: Wound #1 Right Gluteal fold: Skin Prep Primary Wound Dressing: Wound #1 Right Gluteal fold: Silver Collagen Secondary Dressing: Wound #1 Right Gluteal fold: Boardered Foam Dressing Dressing Change Frequency: Wound #1 Right Gluteal fold: Change dressing every other day. Follow-up Appointments: Return Appointment in 1 week. Off-Loading: Wound #1 Right Gluteal fold: Turn and reposition every 2 hours 1. My suggestion at this time is good to be that we go ahead and continue with the silver collagen dressing followed by the border foam dressing she has been performing the dressing changes herself and seems to be doing a great job. Lindsey Pollard, Lindsey Pollard. ([id number]) 2. I recommend as well she continue with appropriate offloading to prevent pressure to the area I think that still of utmost importance. 3. I also recommend that the patient continue  with cleaning the area just with mild soap and water I think that is perfectly appropriate. We will see patient back for reevaluation in 1 week here in the clinic. If anything worsens or changes patient will contact our office for additional recommendations. Electronic Signature(s) Signed: 07/17/2019 1:15:37 PM By: Lenda Kelp PA-C Entered By: Lenda Kelp on 07/17/2019 13:15:36 Lindsey Pollard ([id number]) -------------------------------------------------------------------------------- SuperBill Details Patient Name: Lindsey Pollard Date of Service: 07/17/2019 Medical Record Number: [id number] Patient Account Number: [id number] Date of Birth/Sex:  [birth year] (79 y.o. F) Treating RN: Huel Coventry Primary Care Provider: Einar Crow Other Clinician: Referring Provider: Einar Crow Treating Provider/Extender: Linwood Dibbles, Azahel Belcastro Weeks in Treatment: 1 Diagnosis Coding ICD-10 Codes Code Description [phone] Pressure ulcer of right buttock, stage 2 E11.622 Type 2 diabetes mellitus with other skin ulcer I10 Essential (primary) hypertension I25.10 Atherosclerotic heart disease of native coronary artery without angina pectoris Facility Procedures CPT4 Code: [id number] Description: 99213 - WOUND CARE VISIT-LEV 3 EST PT Modifier: Quantity: 1 Physician Procedures CPT4 Code Description: [id number] 99213 - WC PHYS LEVEL 3 - EST PT ICD-10 Diagnosis Description L89.312 Pressure ulcer of right buttock, stage 2 E11.622 Type 2 diabetes mellitus with other skin ulcer I10 Essential (primary) hypertension I25.10  Atherosclerotic heart disease of native coronary artery witho Modifier: ut angina pectori Quantity: 1 s Electronic Signature(s) Signed: 07/17/2019 1:15:48 PM By: Lenda Kelp PA-C Entered By: Lenda Kelp on 07/17/2019 13:15:48

## 2019-07-20 NOTE — Progress Notes (Signed)
CHARNICE, ZWILLING ([id number]) Visit Report for 07/17/2019 Arrival Information Details Patient Name: Lindsey Pollard, Lindsey Pollard Date of Service: 07/17/2019 12:45 PM Medical Record Number: [id number] Patient Account Number: [id number] Date of Birth/Sex: 10/13/1940 (78 y.o. F) Treating RN: [name] Primary Care Greogry Goodwyn: [name] Other Clinician: Referring Brighten Buzzelli: [name] Treating Mahmud Keithly/Extender: [name], HOYT Weeks in Treatment: 1 Visit Information History Since Last Visit Added or deleted any medications: No Patient Arrived: Ambulatory Any new allergies or adverse reactions: No Arrival Time: 12:46 Had a fall or experienced change in No Accompanied By: self activities of daily living that may affect Transfer Assistance: None risk of falls: Patient Identification Verified: Yes Signs or symptoms of abuse/neglect since last visito No Secondary Verification Process Completed: Yes Hospitalized since last visit: No Implantable device outside of the clinic excluding No cellular tissue based products placed in the center since last visit: Has Dressing in Place as Prescribed: Yes Pain Present Now: No Electronic Signature(s) Signed: 07/17/2019 4:22:15 PM By: Lorine Bears RCP, RRT, CHT Entered By: Lorine Bears on 07/17/2019 12:47:38 [name] ([id number]) -------------------------------------------------------------------------------- Clinic Level of Care Assessment Details Patient Name: [name] Date of Service: 07/17/2019 12:45 PM Medical Record Number: [id number] Patient Account Number: [id number] Date of Birth/Sex: October 12, 1940 (78 y.o. F) Treating RN: Army Melia Primary Care Exa Bomba: [name] Other Clinician: Referring Damara Klunder: [name] Treating Ellinor Test/Extender: [name], HOYT Weeks in Treatment: 1 Clinic Level of Care Assessment Items TOOL 4 Quantity Score []  - Use when only an EandM is performed on  FOLLOW-UP visit 0 ASSESSMENTS - Nursing Assessment / Reassessment X - Reassessment of Co-morbidities (includes updates in patient status) 1 10 X- 1 5 Reassessment of Adherence to Treatment Plan ASSESSMENTS - Wound and Skin Assessment / Reassessment X - Simple Wound Assessment / Reassessment - one wound 1 5 []  - 0 Complex Wound Assessment / Reassessment - multiple wounds []  - 0 Dermatologic / Skin Assessment (not related to wound area) ASSESSMENTS - Focused Assessment []  - Circumferential Edema Measurements - multi extremities 0 []  - 0 Nutritional Assessment / Counseling / Intervention []  - 0 Lower Extremity Assessment (monofilament, tuning fork, pulses) []  - 0 Peripheral Arterial Disease Assessment (using hand held doppler) ASSESSMENTS - Ostomy and/or Continence Assessment and Care []  - Incontinence Assessment and Management 0 []  - 0 Ostomy Care Assessment and Management (repouching, etc.) PROCESS - Coordination of Care X - Simple Patient / Family Education for ongoing care 1 15 []  - 0 Complex (extensive) Patient / Family Education for ongoing care []  - 0 Staff obtains Programmer, systems, Records, Test Results / Process Orders []  - 0 Staff telephones HHA, Nursing Homes / Clarify orders / etc []  - 0 Routine Transfer to another Facility (non-emergent condition) []  - 0 Routine Hospital Admission (non-emergent condition) []  - 0 New Admissions / Biomedical engineer / Ordering NPWT, Apligraf, etc. []  - 0 Emergency Hospital Admission (emergent condition) X- 1 10 Simple Discharge Coordination RIKKI, TROSPER. ([id number]) []  - 0 Complex (extensive) Discharge Coordination PROCESS - Special Needs []  - Pediatric / Minor Patient Management 0 []  - 0 Isolation Patient Management []  - 0 Hearing / Language / Visual special needs []  - 0 Assessment of Community assistance (transportation, D/C planning, etc.) []  - 0 Additional assistance / Altered mentation []  - 0 Support Surface(s)  Assessment (bed, cushion, seat, etc.) INTERVENTIONS - Wound Cleansing / Measurement X - Simple Wound Cleansing - one wound 1 5 []  - 0 Complex Wound Cleansing - multiple wounds  X- 1 5 Wound Imaging (photographs - any number of wounds) []  - 0 Wound Tracing (instead of photographs) X- 1 5 Simple Wound Measurement - one wound []  - 0 Complex Wound Measurement - multiple wounds INTERVENTIONS - Wound Dressings []  - Small Wound Dressing one or multiple wounds 0 X- 1 15 Medium Wound Dressing one or multiple wounds []  - 0 Large Wound Dressing one or multiple wounds []  - 0 Application of Medications - topical []  - 0 Application of Medications - injection INTERVENTIONS - Miscellaneous []  - External ear exam 0 []  - 0 Specimen Collection (cultures, biopsies, blood, body fluids, etc.) []  - 0 Specimen(s) / Culture(s) sent or taken to Lab for analysis []  - 0 Patient Transfer (multiple staff / / Similar devices) []  - 0 Simple Staple / Suture removal (25 or less) []  - 0 Complex Staple / Suture removal (26 or more) []  - 0 Hypo / Hyperglycemic Management (close monitor of Blood Glucose) []  - 0 Ankle / Brachial Index (ABI) - do not check if billed separately X- 1 5 Vital Signs Dyar, Mixtli M. ( ) Has the patient been seen at the hospital within the last three years: Yes Total Score: 80 Level Of Care: New/Established - Level 3 Electronic Signature(s) Signed: 07/20/2019 12:59:59 PM By: Entered By: on 07/17/2019 13:03:54 ( ) -------------------------------------------------------------------------------- Encounter Discharge Information Details Patient Name: Date of Service: 07/17/2019 12:45 PM Medical Record Number: Nurse, adult Patient Account Number: Date of Birth/Sex: 1941/02/08 (78 y.o. F) Treating RN: Primary Care Reizel Calzada: [id number] Other Clinician: Referring Christpoher Sievers:  07/22/2019 Treating Savior Himebaugh/Extender: Rodell Perna, HOYT Weeks in Treatment: 1 Encounter Discharge Information Items Discharge Condition: Stable Ambulatory Status: Ambulatory Discharge Destination: Home Transportation: Private Auto Accompanied By: self Schedule Follow-up Appointment: Yes Clinical Summary of Care: Electronic Signature(s) Signed: 07/20/2019 12:59:59 PM By: 07/19/2019 Entered By: [name] on 07/17/2019 13:04:27 [name] (07/19/2019) -------------------------------------------------------------------------------- Lower Extremity Assessment Details Patient Name: [id number] Date of Service: 07/17/2019 12:45 PM Medical Record Number: 11/09/1940 Patient Account Number: [id number] Date of Birth/Sex: 1941-05-11 (78 y.o. F) Treating RN: Einar Crow Primary Care Iwalani Templeton: Linwood Dibbles Other Clinician: Referring Crosley Stejskal: 07/22/2019 Treating Matilyn Fehrman/Extender: Rodell Perna, HOYT Weeks in Treatment: 1 Electronic Signature(s) Signed: 07/17/2019 5:05:43 PM By: 07/19/2019, BSN, RN, CWS, Kim RN, BSN Entered By: [name], BSN, RN, CWS, Kim on 07/17/2019 12:56:08 [name] (07/19/2019) -------------------------------------------------------------------------------- Multi Wound Chart Details Patient Name: [id number] Date of Service: 07/17/2019 12:45 PM Medical Record Number: 11/09/1940 Patient Account Number: [id number] Date of Birth/Sex: 1941/06/09 (78 y.o. F) Treating RN: Einar Crow Primary Care Castulo Scarpelli: Linwood Dibbles Other Clinician: Referring Stephanie Mcglone: 07/19/2019 Treating Rajat Staver/Extender: Elliot Gurney, HOYT Weeks in Treatment: 1 Vital Signs Height(in): 59 Pulse(bpm): 61 Weight(lbs): 165 Blood Pressure(mmHg): 140/58 Body Mass Index(BMI): 33 Temperature(F): 98.3 Respiratory Rate 16 (breaths/min): Photos: [N/A:N/A] Wound Location: Right Gluteal fold N/A N/A Wounding Event: Gradually Appeared N/A N/A Primary Etiology:  Pressure Ulcer N/A N/A Comorbid History: Cataracts, Hypertension, N/A N/A Myocardial Infarction, Type II Diabetes, Osteoarthritis Date Acquired: 06/05/2019 N/A N/A Weeks of Treatment: 1 N/A N/A Wound Status: Open N/A N/A Measurements L x W x D 0.2x0.2x0.1 N/A N/A (cm) Area (cm) : 0.031 N/A N/A Volume (cm) : 0.003 N/A N/A % Reduction in Area: 67.00% N/A N/A % Reduction in Volume: 66.70% N/A N/A Classification: Category/Stage II N/A N/A Exudate Amount: Medium N/A N/A Exudate Type: Sanguinous N/A N/A Exudate Color:  red N/A N/A Wound Margin: Flat and Intact N/A N/A Granulation Amount: None Present (0%) N/A N/A Necrotic Amount: None Present (0%) N/A N/A Exposed Structures: Fat Layer (Subcutaneous N/A N/A Tissue) Exposed: Yes Fascia: No Tendon: No Muscle: No Joint: No Bone: No Whittle, Keryl M. ([id number]) Epithelialization: Large (67-100%) N/A N/A Treatment Notes Electronic Signature(s) Signed: 07/20/2019 12:59:59 PM By: Rodell Perna Entered By: Rodell Perna on 07/17/2019 13:02:29 [name] ([id number]) -------------------------------------------------------------------------------- Multi-Disciplinary Care Plan Details Patient Name: [name] Date of Service: 07/17/2019 12:45 PM Medical Record Number: [id number] Patient Account Number: [id number] Date of Birth/Sex: [birth year] (78 y.o. F) Treating RN: Rodell Perna Primary Care Virginia Curl: Einar Crow Other Clinician: Referring Bayard More: Einar Crow Treating Manjit Bufano/Extender: Linwood Dibbles, HOYT Weeks in Treatment: 1 Active Inactive Abuse / Safety / Falls / Self Care Management Nursing Diagnoses: Potential for falls Goals: Patient will remain injury free related to falls Date Initiated: 07/07/2019 Target Resolution Date: 10/03/2019 Goal Status: Active Interventions: Assess fall risk on admission and as needed Notes: Orientation to the Wound Care Program Nursing Diagnoses: Knowledge deficit related to  the wound healing center program Goals: Patient/caregiver will verbalize understanding of the Wound Healing Center Program Date Initiated: 07/07/2019 Target Resolution Date: 10/03/2019 Goal Status: Active Interventions: Provide education on orientation to the wound center Notes: Pressure Nursing Diagnoses: Knowledge deficit related to causes and risk factors for pressure ulcer development Goals: Patient will remain free from development of additional pressure ulcers Date Initiated: 07/07/2019 Target Resolution Date: 10/03/2019 Goal Status: Active Interventions: Assess potential for pressure ulcer upon admission and as needed LARISA, LANIUS. ([id number]) Provide education on pressure ulcers Notes: Wound/Skin Impairment Nursing Diagnoses: Impaired tissue integrity Goals: Ulcer/skin breakdown will heal within 14 weeks Date Initiated: 07/07/2019 Target Resolution Date: 10/03/2019 Goal Status: Active Interventions: Assess patient/caregiver ability to obtain necessary supplies Assess patient/caregiver ability to perform ulcer/skin care regimen upon admission and as needed Assess ulceration(s) every visit Notes: Electronic Signature(s) Signed: 07/20/2019 12:59:59 PM By: Rodell Perna Entered By: Rodell Perna on 07/17/2019 13:02:20 [name] ([id number]) -------------------------------------------------------------------------------- Pain Assessment Details Patient Name: [name] Date of Service: 07/17/2019 12:45 PM Medical Record Number: [id number] Patient Account Number: [id number] Date of Birth/Sex: April 12, 1941 (78 y.o. F) Treating RN: Huel Coventry Primary Care Sakari Raisanen: Einar Crow Other Clinician: Referring Kayli Beal: Einar Crow Treating Denzil Bristol/Extender: Linwood Dibbles, HOYT Weeks in Treatment: 1 Active Problems Location of Pain Severity and Description of Pain Patient Has Paino No Site Locations Pain Management and Medication Current Pain  Management: Electronic Signature(s) Signed: 07/17/2019 4:22:15 PM By: Dayton Martes RCP, RRT, CHT Signed: 07/17/2019 5:05:43 PM By: Elliot Gurney, BSN, RN, CWS, Kim RN, BSN Entered By: Dayton Martes on 07/17/2019 12:47:45 [name] ([id number]) -------------------------------------------------------------------------------- Patient/Caregiver Education Details Patient Name: [name] Date of Service: 07/17/2019 12:45 PM Medical Record Number: [id number] Patient Account Number: [id number] Date of Birth/Gender: 17-Dec-1940 (78 y.o. F) Treating RN: Rodell Perna Primary Care Physician: Einar Crow Other Clinician: Referring Physician: Einar Crow Treating Physician/Extender: Linwood Dibbles, HOYT Weeks in Treatment: 1 Education Assessment Education Provided To: Patient Education Topics Provided Wound/Skin Impairment: Handouts: Caring for Your Ulcer Methods: Demonstration, Explain/Verbal Responses: State content correctly Electronic Signature(s) Signed: 07/20/2019 12:59:59 PM By: Rodell Perna Entered By: Rodell Perna on 07/17/2019 13:04:05 [name] ([id number]) -------------------------------------------------------------------------------- Wound Assessment Details Patient Name: [name] Date of Service: 07/17/2019 12:45 PM Medical Record Number: [id number] Patient Account Number: [id number] Date of Birth/Sex: 04-23-1941 (78 y.o. F) Treating RN: Huel Coventry Primary Care  Dmetrius Ambs: Einar Crow Other Clinician: Referring Seydou Hearns: Einar Crow Treating Tommi Crepeau/Extender: Linwood Dibbles, HOYT Weeks in Treatment: 1 Wound Status Wound Number: 1 Primary Pressure Ulcer Etiology: Wound Location: Right Gluteal fold Wound Open Wounding Event: Gradually Appeared Status: Date Acquired: 06/05/2019 Comorbid Cataracts, Hypertension, Myocardial Infarction, Weeks Of Treatment: 1 History: Type II Diabetes, Osteoarthritis Clustered Wound:  No Photos Wound Measurements Length: (cm) 0.2 % Reduction in Width: (cm) 0.2 % Reduction in Depth: (cm) 0.1 Epithelializat Area: (cm) 0.031 Tunneling: Volume: (cm) 0.003 Undermining: Area: 67% Volume: 66.7% ion: Large (67-100%) No No Wound Description Classification: Category/Stage II Foul Odor Aft Wound Margin: Flat and Intact Slough/Fibrin Exudate Amount: Medium Exudate Type: Sanguinous Exudate Color: red er Cleansing: No o No Wound Bed Granulation Amount: None Present (0%) Exposed Structure Necrotic Amount: None Present (0%) Fascia Exposed: No Fat Layer (Subcutaneous Tissue) Exposed: Yes Tendon Exposed: No Muscle Exposed: No Joint Exposed: No Bone Exposed: No Treatment Notes RAYSA, BOSAK ([id number]) Wound #1 (Right Gluteal fold) Notes prisma, BFD Electronic Signature(s) Signed: 07/17/2019 5:05:43 PM By: Elliot Gurney, BSN, RN, CWS, Kim RN, BSN Entered By: Elliot Gurney, BSN, RN, CWS, Kim on 07/17/2019 12:55:55 [name] ([id number]) -------------------------------------------------------------------------------- Vitals Details Patient Name: [name] Date of Service: 07/17/2019 12:45 PM Medical Record Number: [id number] Patient Account Number: [id number] Date of Birth/Sex: January 15, 1941 (78 y.o. F) Treating RN: Huel Coventry Primary Care Kristena Wilhelmi: Einar Crow Other Clinician: Referring Normagene Harvie: Einar Crow Treating Jeriel Vivanco/Extender: Linwood Dibbles, HOYT Weeks in Treatment: 1 Vital Signs Time Taken: 12:45 Temperature (F): 98.3 Height (in): 59 Pulse (bpm): 61 Weight (lbs): 165 Respiratory Rate (breaths/min): 16 Body Mass Index (BMI): 33.3 Blood Pressure (mmHg): 140/58 Reference Range: 80 - 120 mg / dl Electronic Signature(s) Signed: 07/17/2019 5:05:43 PM By: Elliot Gurney, BSN, RN, CWS, Kim RN, BSN Entered By: Elliot Gurney, BSN, RN, CWS, Kim on 07/17/2019 12:51:20

## 2019-07-21 ENCOUNTER — Ambulatory Visit: Payer: Medicare Other | Admitting: Physician Assistant

## 2019-07-28 ENCOUNTER — Encounter: Payer: Medicare Other | Attending: Physician Assistant | Admitting: Physician Assistant

## 2019-07-28 ENCOUNTER — Other Ambulatory Visit: Payer: Self-pay

## 2019-07-28 DIAGNOSIS — E1151 Type 2 diabetes mellitus with diabetic peripheral angiopathy without gangrene: Secondary | ICD-10-CM | POA: Diagnosis not present

## 2019-07-28 DIAGNOSIS — I10 Essential (primary) hypertension: Secondary | ICD-10-CM | POA: Diagnosis not present

## 2019-07-28 DIAGNOSIS — E669 Obesity, unspecified: Secondary | ICD-10-CM | POA: Insufficient documentation

## 2019-07-28 DIAGNOSIS — L89312 Pressure ulcer of right buttock, stage 2: Secondary | ICD-10-CM | POA: Diagnosis not present

## 2019-07-28 DIAGNOSIS — E11622 Type 2 diabetes mellitus with other skin ulcer: Secondary | ICD-10-CM | POA: Insufficient documentation

## 2019-07-28 DIAGNOSIS — I252 Old myocardial infarction: Secondary | ICD-10-CM | POA: Diagnosis not present

## 2019-07-28 DIAGNOSIS — Z6833 Body mass index (BMI) 33.0-33.9, adult: Secondary | ICD-10-CM | POA: Diagnosis not present

## 2019-07-28 DIAGNOSIS — M1990 Unspecified osteoarthritis, unspecified site: Secondary | ICD-10-CM | POA: Insufficient documentation

## 2019-07-28 DIAGNOSIS — I2510 Atherosclerotic heart disease of native coronary artery without angina pectoris: Secondary | ICD-10-CM | POA: Insufficient documentation

## 2019-07-28 NOTE — Progress Notes (Addendum)
[name], [name] ([id number]) Visit Report for 07/28/2019 Chief Complaint Document Details Patient Name: Lindsey Pollard, Lindsey Pollard. Date of Service: 07/28/2019 2:00 PM Medical Record Number: [id number] Patient Account Number: [id number] Date of Birth/Sex: 1941/04/19 (79 y.o. F) Treating RN: Curtis Sites Primary Care Provider: Einar Crow Other Clinician: Referring Provider: Einar Crow Treating Provider/Extender: Linwood Dibbles, Daylen Hack Weeks in Treatment: 3 Information Obtained from: Patient Chief Complaint Right gluteal fold pressure ulcer Electronic Signature(s) Signed: 07/28/2019 2:18:32 PM By: Lenda Kelp PA-C Entered By: Lenda Kelp on 07/28/2019 14:18:32 Lindsey Pollard ([id number]) -------------------------------------------------------------------------------- HPI Details Patient Name: Lindsey Pollard Date of Service: 07/28/2019 2:00 PM Medical Record Number: [id number] Patient Account Number: [id number] Date of Birth/Sex: 01/02/1941 (79 y.o. F) Treating RN: Curtis Sites Primary Care Provider: Einar Crow Other Clinician: Referring Provider: Einar Crow Treating Provider/Extender: Linwood Dibbles, Aubriella Perezgarcia Weeks in Treatment: 3 History of Present Illness HPI Description: 05/06/2020 on evaluation today patient presents for initial inspection here in our clinic concerning an issue that she is having with a wound in the right gluteal fold region. She has been tolerating some dressing changes she has been performing at home mainly she been putting a little bit of ointment on this and keeping it covered. She notes that this has been present for about 2 months and she is noted some improvement compared to where it started but unfortunately not a significant amount to where she is pleased with how things have gone. Nonetheless she did come in to be seen in order to see if there is anything we can do to aid in helping the area to heal more effectively and quickly. Fortunately there  is no signs of active infection at this time. No fevers, chills, nausea, vomiting, or diarrhea. She has diabetic and on 05/04/19 her most recent reading of 7.9. She also has a history of heart disease of note. She also has hypertension. Her blood pressure is elevated today as well. She follows up with her primary care provider on a regular basis in regard to this. 07/17/2019 on evaluation today patient appears to be doing well with regard to her wound at this time. She has been tolerating the dressing changes without complication. Fortunately she is able to do this herself and seems to be doing quite well. There does not appear to be any signs of active infection at this time which is good news. No fevers, chills, nausea, vomiting, or diarrhea. 07/28/2019 on evaluation today patient appears to be doing a little bit worse today compared to previously noted in regard to her wound. Unfortunately she seems to be having slight hypergranulation I am not exactly sure why this is. Fortunately there is no signs of active infection at this time. No fevers, chills, nausea, vomiting, or diarrhea. With that being said it may be that this is more of a moisture issue this is somewhat in a crease in the gluteal fold region unfortunately that is making things a little bit more severe in my opinion. Electronic Signature(s) Signed: 07/28/2019 2:27:31 PM By: Lenda Kelp PA-C Entered By: Lenda Kelp on 07/28/2019 14:27:30 Lindsey Pollard ([id number]) -------------------------------------------------------------------------------- Physical Exam Details Patient Name: Lindsey Pollard Date of Service: 07/28/2019 2:00 PM Medical Record Number: [id number] Patient Account Number: [id number] Date of Birth/Sex: [birth year] (79 y.o. F) Treating RN: Curtis Sites Primary Care Provider: Einar Crow Other Clinician: Referring Provider: Einar Crow Treating Provider/Extender: Linwood Dibbles, Baltasar Twilley Weeks in Treatment:  3 Constitutional Obese and well-hydrated in no acute distress. Respiratory  normal breathing without difficulty. Psychiatric this patient is able to make decisions and demonstrates good insight into disease process. Alert and Oriented x 3. pleasant and cooperative. Notes Patient's wound bed currently showed signs of fairly good granulation although there was some hyper granular tissue. I think that we may want to switch to [facility] and see how things do over the next week. The patient is in agreement with that plan. With that being said I still think this is something she will be able to change her self as she has been doing. Electronic Signature(s) Signed: 07/28/2019 2:27:50 PM By: Lenda Kelp PA-C Entered By: Lenda Kelp on 07/28/2019 14:27:50 Lindsey Pollard ([id number]) -------------------------------------------------------------------------------- Physician Orders Details Patient Name: Lindsey Pollard Date of Service: 07/28/2019 2:00 PM Medical Record Number: [id number] Patient Account Number: [id number] Date of Birth/Sex: 02-Jun-1941 (79 y.o. F) Treating RN: Curtis Sites Primary Care Provider: Einar Crow Other Clinician: Referring Provider: Einar Crow Treating Provider/Extender: Linwood Dibbles, Peniel Biel Weeks in Treatment: 3 Verbal / Phone Orders: No Diagnosis Coding ICD-10 Coding Code Description L89.312 Pressure ulcer of right buttock, stage 2 E11.622 Type 2 diabetes mellitus with other skin ulcer I10 Essential (primary) hypertension I25.10 Atherosclerotic heart disease of native coronary artery without angina pectoris Wound Cleansing Wound #1 Right Gluteal fold o Clean wound with Normal Saline. o Dial antibacterial soap, wash wounds, rinse and pat dry prior to dressing wounds o May Shower, gently pat wound dry prior to applying new dressing. Skin Barriers/Peri-Wound Care Wound #1 Right Gluteal fold o Skin Prep Primary Wound Dressing Wound #1  Right Gluteal fold o Hydrafera Blue Ready Transfer Secondary Dressing Wound #1 Right Gluteal fold o Boardered Foam Dressing Dressing Change Frequency Wound #1 Right Gluteal fold o Change dressing every other day. Follow-up Appointments o Return Appointment in 1 week. Off-Loading Wound #1 Right Gluteal fold o Turn and reposition every 2 hours Electronic Signature(s) Signed: 07/28/2019 4:55:53 PM By: Curtis Sites Signed: 07/29/2019 1:07:50 AM By: Parthenia Ames, Capron. ([id number]) Entered By: Curtis Sites on 07/28/2019 14:26:40 Lindsey Pollard ([id number]) -------------------------------------------------------------------------------- Problem List Details Patient Name: Lindsey Pollard Date of Service: 07/28/2019 2:00 PM Medical Record Number: [id number] Patient Account Number: [id number] Date of Birth/Sex: 08/13/1940 (79 y.o. F) Treating RN: Curtis Sites Primary Care Provider: Einar Crow Other Clinician: Referring Provider: Einar Crow Treating Provider/Extender: Linwood Dibbles, Pegeen Stiger Weeks in Treatment: 3 Active Problems ICD-10 Evaluated Encounter Code Description Active Date Today Diagnosis L89.312 Pressure ulcer of right buttock, stage 2 07/07/2019 No Yes E11.622 Type 2 diabetes mellitus with other skin ulcer 07/07/2019 No Yes I10 Essential (primary) hypertension 07/07/2019 No Yes I25.10 Atherosclerotic heart disease of native coronary artery 07/07/2019 No Yes without angina pectoris Inactive Problems Resolved Problems Electronic Signature(s) Signed: 07/28/2019 2:17:54 PM By: Lenda Kelp PA-C Entered By: Lenda Kelp on 07/28/2019 14:17:53 Lindsey Pollard ([id number]) -------------------------------------------------------------------------------- Progress Note Details Patient Name: Lindsey Pollard Date of Service: 07/28/2019 2:00 PM Medical Record Number: [id number] Patient Account Number: [id number] Date of Birth/Sex: April 30, 1941 (78  y.o. F) Treating RN: Curtis Sites Primary Care Provider: Einar Crow Other Clinician: Referring Provider: Einar Crow Treating Provider/Extender: Linwood Dibbles, Terra Aveni Weeks in Treatment: 3 Subjective Chief Complaint Information obtained from Patient Right gluteal fold pressure ulcer History of Present Illness (HPI) 05/06/2020 on evaluation today patient presents for initial inspection here in our clinic concerning an issue that she is having with a wound in the right gluteal fold region. She has been tolerating  some dressing changes she has been performing at home mainly she been putting a little bit of ointment on this and keeping it covered. She notes that this has been present for about 2 months and she is noted some improvement compared to where it started but unfortunately not a significant amount to where she is pleased with how things have gone. Nonetheless she did come in to be seen in order to see if there is anything we can do to aid in helping the area to heal more effectively and quickly. Fortunately there is no signs of active infection at this time. No fevers, chills, nausea, vomiting, or diarrhea. She has diabetic and on 05/04/19 her most recent reading of 7.9. She also has a history of heart disease of note. She also has hypertension. Her blood pressure is elevated today as well. She follows up with her primary care provider on a regular basis in regard to this. 07/17/2019 on evaluation today patient appears to be doing well with regard to her wound at this time. She has been tolerating the dressing changes without complication. Fortunately she is able to do this herself and seems to be doing quite well. There does not appear to be any signs of active infection at this time which is good news. No fevers, chills, nausea, vomiting, or diarrhea. 07/28/2019 on evaluation today patient appears to be doing a little bit worse today compared to previously noted in regard to  her wound. Unfortunately she seems to be having slight hypergranulation I am not exactly sure why this is. Fortunately there is no signs of active infection at this time. No fevers, chills, nausea, vomiting, or diarrhea. With that being said it may be that this is more of a moisture issue this is somewhat in a crease in the gluteal fold region unfortunately that is making things a little bit more severe in my opinion. Objective Constitutional Obese and well-hydrated in no acute distress. Vitals Time Taken: 2:05 PM, Height: 59 in, Weight: 165 lbs, BMI: 33.3, Temperature: 98.0 F, Pulse: 90 bpm, Respiratory Rate: 16 breaths/min, Blood Pressure: 152/58 mmHg. Respiratory normal breathing without difficulty. SARIT, SPARANO ([id number]) Psychiatric this patient is able to make decisions and demonstrates good insight into disease process. Alert and Oriented x 3. pleasant and cooperative. General Notes: Patient's wound bed currently showed signs of fairly good granulation although there was some hyper granular tissue. I think that we may want to switch to [facility] and see how things do over the next week. The patient is in agreement with that plan. With that being said I still think this is something she will be able to change her self as she has been doing. Integumentary (Hair, Skin) Wound #1 status is Open. Original cause of wound was Gradually Appeared. The wound is located on the Right Gluteal fold. The wound measures 0.5cm length x 1cm width x 0.1cm depth; 0.393cm^2 area and 0.039cm^3 volume. There is Fat Layer (Subcutaneous Tissue) Exposed exposed. There is no tunneling or undermining noted. There is a medium amount of sanguinous drainage noted. The wound margin is flat and intact. There is no granulation within the wound bed. There is no necrotic tissue within the wound bed. Assessment Active Problems ICD-10 Pressure ulcer of right buttock, stage 2 Type 2 diabetes mellitus with  other skin ulcer Essential (primary) hypertension Atherosclerotic heart disease of native coronary artery without angina pectoris Plan Wound Cleansing: Wound #1 Right Gluteal fold: Clean wound with Normal Saline. Dial antibacterial soap,  wash wounds, rinse and pat dry prior to dressing wounds May Shower, gently pat wound dry prior to applying new dressing. Skin Barriers/Peri-Wound Care: Wound #1 Right Gluteal fold: Skin Prep Primary Wound Dressing: Wound #1 Right Gluteal fold: Hydrafera Blue Ready Transfer Secondary Dressing: Wound #1 Right Gluteal fold: Boardered Foam Dressing Dressing Change Frequency: Wound #1 Right Gluteal fold: Change dressing every other day. Follow-up Appointments: Return Appointment in 1 week. Off-Loading: CHARMAN, BLASCO M. ([id number]) Wound #1 Right Gluteal fold: Turn and reposition every 2 hours 1. My suggestion is good to be that we switch to the [facility] dressing and discontinue the silver collagen at this point. 2. I am also going to suggest she continue with the bordered foam dressing I think that is probably still her best option as far as coverage dressings are concerned with the foam from the [facility] that should aid in some additional protection as far as the cushioning is concerned. 3. I am also going to suggest that she continue with appropriate offloading I would not recommend that she sit for too long a period of time. She needs to be changing positions at least every 2 hours getting up and moving around. We will see patient back for reevaluation in 1 week here in the clinic. If anything worsens or changes patient will contact our office for additional recommendations. Electronic Signature(s) Signed: 07/28/2019 2:28:34 PM By: Lenda Kelp PA-C Entered By: Lenda Kelp on 07/28/2019 14:28:33 Lindsey Pollard ([id number]) -------------------------------------------------------------------------------- SuperBill Details Patient  Name: Lindsey Pollard Date of Service: 07/28/2019 Medical Record Number: [id number] Patient Account Number: [id number] Date of Birth/Sex: 11-05-1940 (79 y.o. F) Treating RN: Curtis Sites Primary Care Provider: Einar Crow Other Clinician: Referring Provider: Einar Crow Treating Provider/Extender: Linwood Dibbles, Sho Salguero Weeks in Treatment: 3 Diagnosis Coding ICD-10 Codes Code Description [phone] Pressure ulcer of right buttock, stage 2 E11.622 Type 2 diabetes mellitus with other skin ulcer I10 Essential (primary) hypertension I25.10 Atherosclerotic heart disease of native coronary artery without angina pectoris Facility Procedures CPT4 Code: [id number] Description: 99213 - WOUND CARE VISIT-LEV 3 EST PT Modifier: Quantity: 1 Physician Procedures CPT4 Code Description: [id number] 99213 - WC PHYS LEVEL 3 - EST PT ICD-10 Diagnosis Description L89.312 Pressure ulcer of right buttock, stage 2 E11.622 Type 2 diabetes mellitus with other skin ulcer I10 Essential (primary) hypertension I25.10  Atherosclerotic heart disease of native coronary artery witho Modifier: ut angina pectori Quantity: 1 s Electronic Signature(s) Signed: 07/28/2019 2:29:11 PM By: Lenda Kelp PA-C Entered By: Lenda Kelp on 07/28/2019 14:29:11

## 2019-07-29 NOTE — Progress Notes (Signed)
Lindsey Pollard ([id number]) Visit Report for 07/28/2019 Arrival Information Details Patient Name: Lindsey Pollard, Lindsey Pollard Date of Service: 07/28/2019 2:00 PM Medical Record Number: [id number] Patient Account Number: [id number] Date of Birth/Sex: 1940/07/30 (79 y.o. F) Treating RN: Curtis Sites Primary Care Khalani Novoa: Einar Crow Other Clinician: Referring Quirino Kakos: Einar Crow Treating Unnamed Hino/Extender: Linwood Dibbles, HOYT Weeks in Treatment: 3 Visit Information History Since Last Visit Added or deleted any medications: No Patient Arrived: Ambulatory Any new allergies or adverse reactions: No Arrival Time: 14:05 Had a fall or experienced change in No Accompanied By: self activities of daily living that may affect Transfer Assistance: None risk of falls: Patient Identification Verified: Yes Signs or symptoms of abuse/neglect since last visito No Secondary Verification Process Completed: Yes Hospitalized since last visit: No Implantable device outside of the clinic excluding No cellular tissue based products placed in the center since last visit: Has Dressing in Place as Prescribed: Yes Pain Present Now: No Electronic Signature(s) Signed: 07/28/2019 4:23:53 PM By: Dayton Martes RCP, RRT, CHT Entered By: Dayton Martes on 07/28/2019 14:09:01 Lindsey Pollard ([id number]) -------------------------------------------------------------------------------- Clinic Level of Care Assessment Details Patient Name: Lindsey Pollard Date of Service: 07/28/2019 2:00 PM Medical Record Number: [id number] Patient Account Number: [id number] Date of Birth/Sex: [birth year] (79 y.o. F) Treating RN: Curtis Sites Primary Care Romi Rathel: Einar Crow Other Clinician: Referring [place] Latulippe: Einar Crow Treating Sherill Mangen/Extender: Linwood Dibbles, HOYT Weeks in Treatment: 3 Clinic Level of Care Assessment Items TOOL 4 Quantity Score []  - Use when only an EandM is performed on  FOLLOW-UP visit 0 ASSESSMENTS - Nursing Assessment / Reassessment X - Reassessment of Co-morbidities (includes updates in patient status) 1 10 X- 1 5 Reassessment of Adherence to Treatment Plan ASSESSMENTS - Wound and Skin Assessment / Reassessment X - Simple Wound Assessment / Reassessment - one wound 1 5 []  - 0 Complex Wound Assessment / Reassessment - multiple wounds []  - 0 Dermatologic / Skin Assessment (not related to wound area) ASSESSMENTS - Focused Assessment []  - Circumferential Edema Measurements - multi extremities 0 []  - 0 Nutritional Assessment / Counseling / Intervention []  - 0 Lower Extremity Assessment (monofilament, tuning fork, pulses) []  - 0 Peripheral Arterial Disease Assessment (using hand held doppler) ASSESSMENTS - Ostomy and/or Continence Assessment and Care []  - Incontinence Assessment and Management 0 []  - 0 Ostomy Care Assessment and Management (repouching, etc.) PROCESS - Coordination of Care X - Simple Patient / Family Education for ongoing care 1 15 []  - 0 Complex (extensive) Patient / Family Education for ongoing care X- 1 10 Staff obtains , Records, Test Results / Process Orders []  - 0 Staff telephones HHA, Nursing Homes / Clarify orders / etc []  - 0 Routine Transfer to another Facility (non-emergent condition) []  - 0 Routine Hospital Admission (non-emergent condition) []  - 0 New Admissions / / Ordering NPWT, Apligraf, etc. []  - 0 Emergency Hospital Admission (emergent condition) X- 1 10 Simple Discharge Coordination ABIGAL, CHOUNG. ( ) []  - 0 Complex (extensive) Discharge Coordination PROCESS - Special Needs []  - Pediatric / Minor Patient Management 0 []  - 0 Isolation Patient Management []  - 0 Hearing / Language / Visual special needs []  - 0 Assessment of Community assistance (transportation, D/C planning, etc.) []  - 0 Additional assistance / Altered mentation []  - 0 Support Surface(s)  Assessment (bed, cushion, seat, etc.) INTERVENTIONS - Wound Cleansing / Measurement X - Simple Wound Cleansing - one wound 1 5 []  - 0 Complex Wound Cleansing - multiple wounds  X- 1 5 Wound Imaging (photographs - any number of wounds) []  - 0 Wound Tracing (instead of photographs) X- 1 5 Simple Wound Measurement - one wound []  - 0 Complex Wound Measurement - multiple wounds INTERVENTIONS - Wound Dressings X - Small Wound Dressing one or multiple wounds 1 10 []  - 0 Medium Wound Dressing one or multiple wounds []  - 0 Large Wound Dressing one or multiple wounds []  - 0 Application of Medications - topical []  - 0 Application of Medications - injection INTERVENTIONS - Miscellaneous []  - External ear exam 0 []  - 0 Specimen Collection (cultures, biopsies, blood, body fluids, etc.) []  - 0 Specimen(s) / Culture(s) sent or taken to Lab for analysis []  - 0 Patient Transfer (multiple staff / / Similar devices) []  - 0 Simple Staple / Suture removal (25 or less) []  - 0 Complex Staple / Suture removal (26 or more) []  - 0 Hypo / Hyperglycemic Management (close monitor of Blood Glucose) []  - 0 Ankle / Brachial Index (ABI) - do not check if billed separately X- 1 5 Vital Signs Armato, Kippy M. ( ) Has the patient been seen at the hospital within the last three years: Yes Total Score: 85 Level Of Care: New/Established - Level 3 Electronic Signature(s) Signed: 07/28/2019 4:55:53 PM By: Entered By: on 07/28/2019 14:26:59 ( ) -------------------------------------------------------------------------------- Encounter Discharge Information Details Patient Name: Date of Service: 07/28/2019 2:00 PM Medical Record Number: Nurse, adult Patient Account Number: Date of Birth/Sex: 25-Apr-1941 (79 y.o. F) Treating RN: Primary Care Lenox Bink: [phone] Other Clinician: Referring  Dannia Snook: 09/25/2019 Treating Augusto Deckman/Extender: Curtis Sites, HOYT Weeks in Treatment: 3 Encounter Discharge Information Items Discharge Condition: Stable Ambulatory Status: Ambulatory Discharge Destination: Home Transportation: Private Auto Accompanied By: self Schedule Follow-up Appointment: Yes Clinical Summary of Care: Electronic Signature(s) Signed: 07/28/2019 4:55:53 PM By: 09/25/2019 Entered By: Lindsey Pollard on 07/28/2019 14:27:50 Lindsey Pollard (09/25/2019) -------------------------------------------------------------------------------- Lower Extremity Assessment Details Patient Name: [id number] Date of Service: 07/28/2019 2:00 PM Medical Record Number: 11/09/1940 Patient Account Number: ([id number] Date of Birth/Sex: June 15, 1941 (79 y.o. F) Treating RN: Einar Crow Primary Care Adryan Druckenmiller: Linwood Dibbles Other Clinician: Referring Helena Sardo: 09/25/2019 Treating Kenosha Doster/Extender: Curtis Sites, HOYT Weeks in Treatment: 3 Electronic Signature(s) Signed: 07/29/2019 9:28:55 AM By: 09/25/2019 Entered By: Lindsey Pollard on 07/28/2019 14:16:09 Lindsey Pollard (09/25/2019) -------------------------------------------------------------------------------- Multi Wound Chart Details Patient Name: [id number] Date of Service: 07/28/2019 2:00 PM Medical Record Number: 11/09/1940 Patient Account Number: [id number] Date of Birth/Sex: May 10, 1941 (79 y.o. F) Treating RN: Einar Crow Primary Care Pavneet Markwood: Linwood Dibbles Other Clinician: Referring Tyrece Vanterpool: 09/26/2019 Treating Everlynn Sagun/Extender: Rodell Perna, HOYT Weeks in Treatment: 3 Vital Signs Height(in): 59 Pulse(bpm): 90 Weight(lbs): 165 Blood Pressure(mmHg): 152/58 Body Mass Index(BMI): 33 Temperature(F): 98.0 Respiratory Rate 16 (breaths/min): Photos: [N/A:N/A] Wound Location: Right Gluteal fold N/A N/A Wounding Event: Gradually Appeared N/A N/A Primary Etiology: Pressure Ulcer N/A  N/A Comorbid History: Cataracts, Hypertension, N/A N/A Myocardial Infarction, Type II Diabetes, Osteoarthritis Date Acquired: 06/05/2019 N/A N/A Weeks of Treatment: 3 N/A N/A Wound Status: Open N/A N/A Measurements L x W x D 0.5x1x0.1 N/A N/A (cm) Area (cm) : 0.393 N/A N/A Volume (cm) : 0.039 N/A N/A % Reduction in Area: -318.10% N/A N/A % Reduction in Volume: -333.30% N/A N/A Classification: Category/Stage II N/A N/A Exudate Amount: Medium N/A N/A Exudate Type: Sanguinous N/A N/A Exudate Color: red N/A N/A Wound Margin: Flat and  Intact N/A N/A Granulation Amount: None Present (0%) N/A N/A Necrotic Amount: None Present (0%) N/A N/A Exposed Structures: Fat Layer (Subcutaneous N/A N/A Tissue) Exposed: Yes Fascia: No Tendon: No Muscle: No Joint: No Bone: No Bamba, Brayden M. ([id number]) Epithelialization: Large (67-100%) N/A N/A Treatment Notes Electronic Signature(s) Signed: 07/28/2019 4:55:53 PM By: Curtis Sites Entered By: Curtis Sites on 07/28/2019 14:26:01 Lindsey Pollard ([id number]) -------------------------------------------------------------------------------- Multi-Disciplinary Care Plan Details Patient Name: Lindsey Pollard Date of Service: 07/28/2019 2:00 PM Medical Record Number: [id number] Patient Account Number: [id number] Date of Birth/Sex: 28-Dec-1940 (79 y.o. F) Treating RN: Curtis Sites Primary Care Albertia Carvin: Einar Crow Other Clinician: Referring Ziyad Dyar: Einar Crow Treating Rahul Malinak/Extender: Linwood Dibbles, HOYT Weeks in Treatment: 3 Active Inactive Abuse / Safety / Falls / Self Care Management Nursing Diagnoses: Potential for falls Goals: Patient will remain injury free related to falls Date Initiated: 07/07/2019 Target Resolution Date: 10/03/2019 Goal Status: Active Interventions: Assess fall risk on admission and as needed Notes: Orientation to the Wound Care Program Nursing Diagnoses: Knowledge deficit related to the wound  healing center program Goals: Patient/caregiver will verbalize understanding of the Wound Healing Center Program Date Initiated: 07/07/2019 Target Resolution Date: 10/03/2019 Goal Status: Active Interventions: Provide education on orientation to the wound center Notes: Pressure Nursing Diagnoses: Knowledge deficit related to causes and risk factors for pressure ulcer development Goals: Patient will remain free from development of additional pressure ulcers Date Initiated: 07/07/2019 Target Resolution Date: 10/03/2019 Goal Status: Active Interventions: Assess potential for pressure ulcer upon admission and as needed ANNALIESE, SAEZ. ([id number]) Provide education on pressure ulcers Notes: Wound/Skin Impairment Nursing Diagnoses: Impaired tissue integrity Goals: Ulcer/skin breakdown will heal within 14 weeks Date Initiated: 07/07/2019 Target Resolution Date: 10/03/2019 Goal Status: Active Interventions: Assess patient/caregiver ability to obtain necessary supplies Assess patient/caregiver ability to perform ulcer/skin care regimen upon admission and as needed Assess ulceration(s) every visit Notes: Electronic Signature(s) Signed: 07/28/2019 4:55:53 PM By: Curtis Sites Entered By: Curtis Sites on 07/28/2019 14:25:11 Lindsey Pollard ([id number]) -------------------------------------------------------------------------------- Pain Assessment Details Patient Name: Lindsey Pollard Date of Service: 07/28/2019 2:00 PM Medical Record Number: [id number] Patient Account Number: [id number] Date of Birth/Sex: 1941-05-02 (79 y.o. F) Treating RN: Curtis Sites Primary Care Joas Motton: Einar Crow Other Clinician: Referring Jireh Elmore: Einar Crow Treating Siboney Requejo/Extender: Linwood Dibbles, HOYT Weeks in Treatment: 3 Active Problems Location of Pain Severity and Description of Pain Patient Has Paino No Site Locations Pain Management and Medication Current Pain Management: Electronic  Signature(s) Signed: 07/28/2019 4:23:53 PM By: Sallee Provencal, RRT, CHT Signed: 07/28/2019 4:55:53 PM By: Curtis Sites Entered By: Dayton Martes on 07/28/2019 14:09:07 Lindsey Pollard ([id number]) -------------------------------------------------------------------------------- Patient/Caregiver Education Details Patient Name: Lindsey Pollard Date of Service: 07/28/2019 2:00 PM Medical Record Number: [id number] Patient Account Number: [id number] Date of Birth/Gender: 01/28/1941 (79 y.o. F) Treating RN: Curtis Sites Primary Care Physician: Einar Crow Other Clinician: Referring Physician: Einar Crow Treating Physician/Extender: Linwood Dibbles, HOYT Weeks in Treatment: 3 Education Assessment Education Provided To: Patient Education Topics Provided Wound/Skin Impairment: Handouts: Other: wound care as ordered Methods: Demonstration, Explain/Verbal Responses: State content correctly Electronic Signature(s) Signed: 07/28/2019 4:55:53 PM By: Curtis Sites Entered By: Curtis Sites on 07/28/2019 14:27:19 Lindsey Pollard ([id number]) -------------------------------------------------------------------------------- Wound Assessment Details Patient Name: Lindsey Pollard Date of Service: 07/28/2019 2:00 PM Medical Record Number: [id number] Patient Account Number: [id number] Date of Birth/Sex: 12-18-1940 (79 y.o. F) Treating RN: Rodell Perna Primary Care Sebrena Engh: Einar Crow Other Clinician: Referring Joniqua Sidle: Einar Crow Treating Wanetta Funderburke/Extender:  [name], HOYT Weeks in Treatment: 3 Wound Status Wound Number: 1 Primary Pressure Ulcer Etiology: Wound Location: Right Gluteal fold Wound Open Wounding Event: Gradually Appeared Status: Date Acquired: 06/05/2019 Comorbid Cataracts, Hypertension, Myocardial Infarction, Weeks Of Treatment: 3 History: Type II Diabetes, Osteoarthritis Clustered Wound: No Photos Wound  Measurements Length: (cm) 0.5 % Reduction in Width: (cm) 1 % Reduction in Depth: (cm) 0.1 Epithelializati Area: (cm) 0.393 Tunneling: Volume: (cm) 0.039 Undermining: Area: -318.1% Volume: -333.3% on: Large (67-100%) No No Wound Description Classification: Category/Stage II Foul Odor After Wound Margin: Flat and Intact Slough/Fibrino Exudate Amount: Medium Exudate Type: Sanguinous Exudate Color: red Cleansing: No No Wound Bed Granulation Amount: None Present (0%) Exposed Structure Necrotic Amount: None Present (0%) Fascia Exposed: No Fat Layer (Subcutaneous Tissue) Exposed: Yes Tendon Exposed: No Muscle Exposed: No Joint Exposed: No Bone Exposed: No Treatment Notes Riches, Avree M. ([id number]) Wound #1 (Right Gluteal fold) Notes hydrofera blue, BFD Electronic Signature(s) Signed: 07/29/2019 9:28:55 AM By: Army Melia Entered By: Army Melia on 07/28/2019 14:15:56 [name] ([id number]) -------------------------------------------------------------------------------- Vitals Details Patient Name: [name] Date of Service: 07/28/2019 2:00 PM Medical Record Number: [id number] Patient Account Number: [id number] Date of Birth/Sex: 04-06-1941 (79 y.o. F) Treating RN: Montey Hora Primary Care Lorayne Getchell: [name] Other Clinician: Referring Bexleigh Theriault: [name] Treating Vale Peraza/Extender: [name], HOYT Weeks in Treatment: 3 Vital Signs Time Taken: 14:05 Temperature (F): 98.0 Height (in): 59 Pulse (bpm): 90 Weight (lbs): 165 Respiratory Rate (breaths/min): 16 Body Mass Index (BMI): 33.3 Blood Pressure (mmHg): 152/58 Reference Range: 80 - 120 mg / dl Electronic Signature(s) Signed: 07/28/2019 4:23:53 PM By: Lorine Bears RCP, RRT, CHT Entered By: Lorine Bears on 07/28/2019 14:09:37

## 2019-08-04 ENCOUNTER — Encounter: Payer: Medicare Other | Admitting: Physician Assistant

## 2019-08-04 ENCOUNTER — Other Ambulatory Visit: Payer: Self-pay

## 2019-08-04 DIAGNOSIS — E11622 Type 2 diabetes mellitus with other skin ulcer: Secondary | ICD-10-CM | POA: Diagnosis not present

## 2019-08-04 NOTE — Progress Notes (Addendum)
SINEAD, HOCKMAN ([id number]) Visit Report for 08/04/2019 Chief Complaint Document Details Patient Name: Lindsey Pollard, Lindsey Pollard Date of Service: 08/04/2019 12:30 PM Medical Record Number: [id number] Patient Account Number: [id number] Date of Birth/Sex: 22-Jun-1941 (78 y.o. F) Treating RN: Curtis Sites Primary Care Provider: Einar Crow Other Clinician: Referring Provider: Einar Crow Treating Provider/Extender: Linwood Dibbles, Haeleigh Streiff Weeks in Treatment: 4 Information Obtained from: Patient Chief Complaint Right gluteal fold pressure ulcer Electronic Signature(s) Signed: 08/04/2019 12:49:30 PM By: Lenda Kelp PA-C Entered By: Lenda Kelp on 08/04/2019 12:49:29 Lindsey Pollard ([id number]) -------------------------------------------------------------------------------- HPI Details Patient Name: Lindsey Pollard Date of Service: 08/04/2019 12:30 PM Medical Record Number: [id number] Patient Account Number: [id number] Date of Birth/Sex: February 24, 1941 (78 y.o. F) Treating RN: Curtis Sites Primary Care Provider: Einar Crow Other Clinician: Referring Provider: Einar Crow Treating Provider/Extender: Linwood Dibbles, Nashly Olsson Weeks in Treatment: 4 History of Present Illness HPI Description: 05/06/2020 on evaluation today patient presents for initial inspection here in our clinic concerning an issue that she is having with a wound in the right gluteal fold region. She has been tolerating some dressing changes she has been performing at home mainly she been putting a little bit of ointment on this and keeping it covered. She notes that this has been present for about 2 months and she is noted some improvement compared to where it started but unfortunately not a significant amount to where she is pleased with how things have gone. Nonetheless she did come in to be seen in order to see if there is anything we can do to aid in helping the area to heal more effectively and quickly. Fortunately  there is no signs of active infection at this time. No fevers, chills, nausea, vomiting, or diarrhea. She has diabetic and on 05/04/19 her most recent reading of 7.9. She also has a history of heart disease of note. She also has hypertension. Her blood pressure is elevated today as well. She follows up with her primary care provider on a regular basis in regard to this. 07/17/2019 on evaluation today patient appears to be doing well with regard to her wound at this time. She has been tolerating the dressing changes without complication. Fortunately she is able to do this herself and seems to be doing quite well. There does not appear to be any signs of active infection at this time which is good news. No fevers, chills, nausea, vomiting, or diarrhea. 07/28/2019 on evaluation today patient appears to be doing a little bit worse today compared to previously noted in regard to her wound. Unfortunately she seems to be having slight hypergranulation I am not exactly sure why this is. Fortunately there is no signs of active infection at this time. No fevers, chills, nausea, vomiting, or diarrhea. With that being said it may be that this is more of a moisture issue this is somewhat in a crease in the gluteal fold region unfortunately that is making things a little bit more severe in my opinion. 08/04/2019 upon evaluation today patient appears to be doing excellent in regard to her wound. In fact this is almost completely healed and seems to have made great progress since last week. Fortunately there is no signs of active infection at this time. Electronic Signature(s) Signed: 08/04/2019 12:55:25 PM By: Lenda Kelp PA-C Entered By: Lenda Kelp on 08/04/2019 12:55:25 Lindsey Pollard ([id number]) -------------------------------------------------------------------------------- Physical Exam Details Patient Name: Lindsey Pollard Date of Service: 08/04/2019 12:30 PM Medical Record Number: [id number] Patient  Account Number: [id number] Date of Birth/Sex: [birth year] (79 y.o. F) Treating RN: Curtis Sites Primary Care Provider: Einar Crow Other Clinician: Referring Provider: Einar Crow Treating Provider/Extender: [name], Shirleen Mcfaul Weeks in Treatment: 4 Constitutional Well-nourished and well-hydrated in no acute distress. Respiratory normal breathing without difficulty. Psychiatric this patient is able to make decisions and demonstrates good insight into disease process. Alert and Oriented x 3. pleasant and cooperative. Notes Patient's wound bed currently again has made excellent progress since last week's evaluation and seems to be doing quite well. I am very pleased with how things appear at this point. Fortunately there is no evidence of active infection. Electronic Signature(s) Signed: 08/04/2019 12:55:40 PM By: Lenda Kelp PA-C Entered By: Lenda Kelp on 08/04/2019 12:55:39 Lindsey Pollard ([id number]) -------------------------------------------------------------------------------- Physician Orders Details Patient Name: Lindsey Pollard Date of Service: 08/04/2019 12:30 PM Medical Record Number: [id number] Patient Account Number: [id number] Date of Birth/Sex: [birth year] (78 y.o. F) Treating RN: Curtis Sites Primary Care Provider: Einar Crow Other Clinician: Referring Provider: Einar Crow Treating Provider/Extender: Linwood Dibbles, Kashaun Bebo Weeks in Treatment: 4 Verbal / Phone Orders: No Diagnosis Coding ICD-10 Coding Code Description L89.312 Pressure ulcer of right buttock, stage 2 E11.622 Type 2 diabetes mellitus with other skin ulcer I10 Essential (primary) hypertension I25.10 Atherosclerotic heart disease of native coronary artery without angina pectoris Wound Cleansing Wound #1 Right Gluteal fold o Clean wound with Normal Saline. o Dial antibacterial soap, wash wounds, rinse and pat dry prior to dressing wounds o May Shower, gently pat wound  dry prior to applying new dressing. Skin Barriers/Peri-Wound Care Wound #1 Right Gluteal fold o Skin Prep Primary Wound Dressing Wound #1 Right Gluteal fold o Hydrafera Blue Ready Transfer Secondary Dressing Wound #1 Right Gluteal fold o Boardered Foam Dressing Dressing Change Frequency Wound #1 Right Gluteal fold o Change dressing every other day. Follow-up Appointments o Return Appointment in 1 week. Off-Loading Wound #1 Right Gluteal fold o Turn and reposition every 2 hours Electronic Signature(s) Signed: 08/04/2019 4:04:09 PM By: Lenda Kelp PA-C Signed: 08/04/2019 4:13:47 PM By: Leontine Locket ([id number]) Entered By: Curtis Sites on 08/04/2019 12:52:40 Lindsey Pollard ([id number]) -------------------------------------------------------------------------------- Problem List Details Patient Name: Lindsey Pollard Date of Service: 08/04/2019 12:30 PM Medical Record Number: [id number] Patient Account Number: [id number] Date of Birth/Sex: [birth year] (78 y.o. F) Treating RN: Curtis Sites Primary Care Provider: Einar Crow Other Clinician: Referring Provider: Einar Crow Treating Provider/Extender: Linwood Dibbles, Adryan Druckenmiller Weeks in Treatment: 4 Active Problems ICD-10 Evaluated Encounter Code Description Active Date Today Diagnosis L89.312 Pressure ulcer of right buttock, stage 2 07/07/2019 No Yes E11.622 Type 2 diabetes mellitus with other skin ulcer 07/07/2019 No Yes I10 Essential (primary) hypertension 07/07/2019 No Yes I25.10 Atherosclerotic heart disease of native coronary artery 07/07/2019 No Yes without angina pectoris Inactive Problems Resolved Problems Electronic Signature(s) Signed: 08/04/2019 12:49:22 PM By: Lenda Kelp PA-C Entered By: Lenda Kelp on 08/04/2019 12:49:21 Lindsey Pollard ([id number]) -------------------------------------------------------------------------------- Progress Note Details Patient Name: Lindsey Pollard Date of Service: 08/04/2019 12:30 PM Medical Record Number: [id number] Patient Account Number: [id number] Date of Birth/Sex: 21-May-1941 (78 y.o. F) Treating RN: Curtis Sites Primary Care Provider: Einar Crow Other Clinician: Referring Provider: Einar Crow Treating Provider/Extender: Linwood Dibbles, Qusay Villada Weeks in Treatment: 4 Subjective Chief Complaint Information obtained from Patient Right gluteal fold pressure ulcer History of Present Illness (HPI) 05/06/2020 on evaluation today patient presents for initial inspection here in our clinic concerning an issue that she is  having with a wound in the right gluteal fold region. She has been tolerating some dressing changes she has been performing at home mainly she been putting a little bit of ointment on this and keeping it covered. She notes that this has been present for about 2 months and she is noted some improvement compared to where it started but unfortunately not a significant amount to where she is pleased with how things have gone. Nonetheless she did come in to be seen in order to see if there is anything we can do to aid in helping the area to heal more effectively and quickly. Fortunately there is no signs of active infection at this time. No fevers, chills, nausea, vomiting, or diarrhea. She has diabetic and on 05/04/19 her most recent reading of 7.9. She also has a history of heart disease of note. She also has hypertension. Her blood pressure is elevated today as well. She follows up with her primary care provider on a regular basis in regard to this. 07/17/2019 on evaluation today patient appears to be doing well with regard to her wound at this time. She has been tolerating the dressing changes without complication. Fortunately she is able to do this herself and seems to be doing quite well. There does not appear to be any signs of active infection at this time which is good news. No fevers, chills, nausea,  vomiting, or diarrhea. 07/28/2019 on evaluation today patient appears to be doing a little bit worse today compared to previously noted in regard to her wound. Unfortunately she seems to be having slight hypergranulation I am not exactly sure why this is. Fortunately there is no signs of active infection at this time. No fevers, chills, nausea, vomiting, or diarrhea. With that being said it may be that this is more of a moisture issue this is somewhat in a crease in the gluteal fold region unfortunately that is making things a little bit more severe in my opinion. 08/04/2019 upon evaluation today patient appears to be doing excellent in regard to her wound. In fact this is almost completely healed and seems to have made great progress since last week. Fortunately there is no signs of active infection at this time. Objective Constitutional Well-nourished and well-hydrated in no acute distress. Vitals Time Taken: 12:41 PM, Height: 59 in, Weight: 165 lbs, BMI: 33.3, Temperature: 97.6 F, Pulse: 72 bpm, Respiratory Rate: 16 breaths/min, Blood Pressure: 168/42 mmHg. TURNER, KUNZMAN M. ([id number]) Respiratory normal breathing without difficulty. Psychiatric this patient is able to make decisions and demonstrates good insight into disease process. Alert and Oriented x 3. pleasant and cooperative. General Notes: Patient's wound bed currently again has made excellent progress since last week's evaluation and seems to be doing quite well. I am very pleased with how things appear at this point. Fortunately there is no evidence of active infection. Integumentary (Hair, Skin) Wound #1 status is Open. Original cause of wound was Gradually Appeared. The wound is located on the Right Gluteal fold. The wound measures 0.2cm length x 0.3cm width x 0.1cm depth; 0.047cm^2 area and 0.005cm^3 volume. There is Fat Layer (Subcutaneous Tissue) Exposed exposed. There is a medium amount of sanguinous drainage noted. The  wound margin is flat and intact. There is no granulation within the wound bed. There is no necrotic tissue within the wound bed. Assessment Active Problems ICD-10 Pressure ulcer of right buttock, stage 2 Type 2 diabetes mellitus with other skin ulcer Essential (primary) hypertension Atherosclerotic heart disease of native  coronary artery without angina pectoris Plan Wound Cleansing: Wound #1 Right Gluteal fold: Clean wound with Normal Saline. Dial antibacterial soap, wash wounds, rinse and pat dry prior to dressing wounds May Shower, gently pat wound dry prior to applying new dressing. Skin Barriers/Peri-Wound Care: Wound #1 Right Gluteal fold: Skin Prep Primary Wound Dressing: Wound #1 Right Gluteal fold: Hydrafera Blue Ready Transfer Secondary Dressing: Wound #1 Right Gluteal fold: Boardered Foam Dressing Dressing Change Frequency: Wound #1 Right Gluteal fold: Change dressing every other day. Follow-up Appointments: Return Appointment in 1 week. Off-Loading: WELTHA, CATHY M. ([id number]) Wound #1 Right Gluteal fold: Turn and reposition every 2 hours 1. I would recommend we continue with the [facility] dressing I think this is done well and she has done excellent with this. 2. I am also going to suggest we continue with the cover border foam dressing which does seem to have been very good for I think between the 2 this is providing some extra cushioning which has helped the area to heal quite nicely. 3. I am also going to suggest that she continue to offload and reposition stating no 1 position for longer than 2 hours. We will see patient back for reevaluation in 1 week here in the clinic. If anything worsens or changes patient will contact our office for additional recommendations. Electronic Signature(s) Signed: 08/04/2019 12:56:16 PM By: Worthy Keeler PA-C Entered By: Worthy Keeler on 08/04/2019 12:56:16 [name]  ([id number]) -------------------------------------------------------------------------------- SuperBill Details Patient Name: [name] Date of Service: 08/04/2019 Medical Record Number: [id number] Patient Account Number: [id number] Date of Birth/Sex: 1940/09/13 (78 y.o. F) Treating RN: Montey Hora Primary Care Provider: [name] Other Clinician: Referring Provider: [name] Treating Provider/Extender: [name], Analynn Daum Weeks in Treatment: 4 Diagnosis Coding ICD-10 Codes Code Description [phone] Pressure ulcer of right buttock, stage 2 E11.622 Type 2 diabetes mellitus with other skin ulcer I10 Essential (primary) hypertension I25.10 Atherosclerotic heart disease of native coronary artery without angina pectoris Facility Procedures CPT4 Code: [id number] Description: 99213 - WOUND CARE VISIT-LEV 3 EST PT Modifier: Quantity: 1 Physician Procedures CPT4 Code Description: [id number] - WC PHYS LEVEL 3 - EST PT ICD-10 Diagnosis Description L89.312 Pressure ulcer of right buttock, stage 2 E11.622 Type 2 diabetes mellitus with other skin ulcer I10 Essential (primary) hypertension I25.10  Atherosclerotic heart disease of native coronary artery witho Modifier: ut angina pectori Quantity: 1 s Electronic Signature(s) Signed: 08/04/2019 12:56:29 PM By: Worthy Keeler PA-C Entered By: Worthy Keeler on 08/04/2019 12:56:29

## 2019-08-05 NOTE — Progress Notes (Signed)
TAMAYA, PUN ([id number]) Visit Report for 08/04/2019 Arrival Information Details Patient Name: Lindsey Pollard, Lindsey Pollard Date of Service: 08/04/2019 12:30 PM Medical Record Number: [id number] Patient Account Number: [id number] Date of Birth/Sex: [birth year] (79 y.o. F) Treating RN: Army Melia Primary Care Lenah Messenger: [name] Other Clinician: Referring Cayetano Mikita: [name] Treating Kellsie Grindle/Extender: [name], HOYT Weeks in Treatment: 4 Visit Information History Since Last Visit Added or deleted any medications: No Patient Arrived: Ambulatory Any new allergies or adverse reactions: No Arrival Time: 12:41 Had a fall or experienced change in No Accompanied By: self activities of daily living that may affect Transfer Assistance: None risk of falls: Patient Identification Verified: Yes Signs or symptoms of abuse/neglect since last visito No Hospitalized since last visit: No Has Dressing in Place as Prescribed: Yes Pain Present Now: No Electronic Signature(s) Signed: 08/05/2019 11:18:27 AM By: Army Melia Entered By: Army Melia on 08/04/2019 12:41:32 Lindsey Pollard ([id number]) -------------------------------------------------------------------------------- Clinic Level of Care Assessment Details Patient Name: Lindsey Pollard Date of Service: 08/04/2019 12:30 PM Medical Record Number: [id number] Patient Account Number: [id number] Date of Birth/Sex: 1941-05-10 (79 y.o. F) Treating RN: Montey Hora Primary Care Orest Dygert: [name] Other Clinician: Referring Sanyah Molnar: [name] Treating Takima Encina/Extender: [name], HOYT Weeks in Treatment: 4 Clinic Level of Care Assessment Items TOOL 4 Quantity Score []  - Use when only an EandM is performed on FOLLOW-UP visit 0 ASSESSMENTS - Nursing Assessment / Reassessment X - Reassessment of Co-morbidities (includes updates in patient status) 1 10 X- 1 5 Reassessment of Adherence to Treatment Plan ASSESSMENTS -  Wound and Skin Assessment / Reassessment X - Simple Wound Assessment / Reassessment - one wound 1 5 []  - 0 Complex Wound Assessment / Reassessment - multiple wounds []  - 0 Dermatologic / Skin Assessment (not related to wound area) ASSESSMENTS - Focused Assessment []  - Circumferential Edema Measurements - multi extremities 0 []  - 0 Nutritional Assessment / Counseling / Intervention []  - 0 Lower Extremity Assessment (monofilament, tuning fork, pulses) []  - 0 Peripheral Arterial Disease Assessment (using hand held doppler) ASSESSMENTS - Ostomy and/or Continence Assessment and Care []  - Incontinence Assessment and Management 0 []  - 0 Ostomy Care Assessment and Management (repouching, etc.) PROCESS - Coordination of Care X - Simple Patient / Family Education for ongoing care 1 15 []  - 0 Complex (extensive) Patient / Family Education for ongoing care X- 1 10 Staff obtains Programmer, systems, Records, Test Results / Process Orders []  - 0 Staff telephones HHA, Nursing Homes / Clarify orders / etc []  - 0 Routine Transfer to another Facility (non-emergent condition) []  - 0 Routine Hospital Admission (non-emergent condition) []  - 0 New Admissions / Biomedical engineer / Ordering NPWT, Apligraf, etc. []  - 0 Emergency Hospital Admission (emergent condition) X- 1 10 Simple Discharge Coordination Lindsey Pollard, ZHAO. ([id number]) []  - 0 Complex (extensive) Discharge Coordination PROCESS - Special Needs []  - Pediatric / Minor Patient Management 0 []  - 0 Isolation Patient Management []  - 0 Hearing / Language / Visual special needs []  - 0 Assessment of Community assistance (transportation, D/C planning, etc.) []  - 0 Additional assistance / Altered mentation []  - 0 Support Surface(s) Assessment (bed, cushion, seat, etc.) INTERVENTIONS - Wound Cleansing / Measurement X - Simple Wound Cleansing - one wound 1 5 []  - 0 Complex Wound Cleansing - multiple wounds X- 1 5 Wound Imaging (photographs  - any number of wounds) []  - 0 Wound Tracing (instead of photographs) X- 1 5 Simple Wound Measurement - one wound []  -  0 Complex Wound Measurement - multiple wounds INTERVENTIONS - Wound Dressings X - Small Wound Dressing one or multiple wounds 1 10 []  - 0 Medium Wound Dressing one or multiple wounds []  - 0 Large Wound Dressing one or multiple wounds []  - 0 Application of Medications - topical []  - 0 Application of Medications - injection INTERVENTIONS - Miscellaneous []  - External ear exam 0 []  - 0 Specimen Collection (cultures, biopsies, blood, body fluids, etc.) []  - 0 Specimen(s) / Culture(s) sent or taken to Lab for analysis []  - 0 Patient Transfer (multiple staff / / Similar devices) []  - 0 Simple Staple / Suture removal (25 or less) []  - 0 Complex Staple / Suture removal (26 or more) []  - 0 Hypo / Hyperglycemic Management (close monitor of Blood Glucose) []  - 0 Ankle / Brachial Index (ABI) - do not check if billed separately X- 1 5 Vital Signs Lindsey Pollard, Lindsey M. ( ) Has the patient been seen at the hospital within the last three years: Yes Total Score: 85 Level Of Care: New/Established - Level 3 Electronic Signature(s) Signed: 08/04/2019 4:13:47 PM By: Entered By: on 08/04/2019 12:56:04 ( ) -------------------------------------------------------------------------------- Encounter Discharge Information Details Patient Name: Nurse, adult Date of Service: 08/04/2019 12:30 PM Medical Record Number: Patient Account Number: Date of Birth/Sex: Nov 16, 1940 (79 y.o. F) Treating RN: 10/02/2019 Primary Care Kaedyn Belardo: Curtis Sites Other Clinician: Referring Elisha Mcgruder: Curtis Sites Treating Naoki Migliaccio/Extender: 10/02/2019, HOYT Weeks in Treatment: 4 Encounter Discharge Information Items Discharge Condition: Stable Ambulatory Status: Ambulatory Discharge Destination:  Home Transportation: Private Auto Accompanied By: self Schedule Follow-up Appointment: Yes Clinical Summary of Care: Electronic Signature(s) Signed: 08/04/2019 4:13:47 PM By: [id number] Entered By: Lindsey Pollard on 08/04/2019 12:57:01 [id number] ([id number]) -------------------------------------------------------------------------------- Lower Extremity Assessment Details Patient Name: Lindsey Pollard Date of Service: 08/04/2019 12:30 PM Medical Record Number: Curtis Sites Patient Account Number: Einar Crow Date of Birth/Sex: 10/17/1940 (78 y.o. F) Treating RN: 10/02/2019 Primary Care Barton Want: Curtis Sites Other Clinician: Referring Arron Tetrault: Curtis Sites Treating Lesha Jager/Extender: 10/02/2019, HOYT Weeks in Treatment: 4 Electronic Signature(s) Signed: 08/05/2019 11:18:27 AM By: [id number] Entered By: Lindsey Pollard on 08/04/2019 12:45:23 [id number] ([id number]) -------------------------------------------------------------------------------- Multi Wound Chart Details Patient Name: Lindsey Pollard Date of Service: 08/04/2019 12:30 PM Medical Record Number: Lindsey Pollard Patient Account Number: Einar Crow Date of Birth/Sex: Jan 31, 1941 (78 y.o. F) Treating RN: 10/03/2019 Primary Care Jarl Sellitto: Lindsey Pollard Other Clinician: Referring Kalinda Romaniello: Lindsey Pollard Treating Rufus Beske/Extender: 10/02/2019, HOYT Weeks in Treatment: 4 Vital Signs Height(in): 59 Pulse(bpm): 72 Weight(lbs): 165 Blood Pressure(mmHg): 168/42 Body Mass Index(BMI): 33 Temperature(F): 97.6 Respiratory Rate 16 (breaths/min): Photos: [N/A:N/A] Wound Location: Right Gluteal fold N/A N/A Wounding Event: Gradually Appeared N/A N/A Primary Etiology: Pressure Ulcer N/A N/A Comorbid History: Cataracts, Hypertension, N/A N/A Myocardial Infarction, Type II Diabetes, Osteoarthritis Date Acquired: 06/05/2019 N/A N/A Weeks of Treatment: 4 N/A N/A Wound Status: Open N/A N/A Measurements L  x W x D 0.2x0.3x0.1 N/A N/A (cm) Area (cm) : 0.047 N/A N/A Volume (cm) : 0.005 N/A N/A % Reduction in Area: 50.00% N/A N/A % Reduction in Volume: 44.40% N/A N/A Classification: Category/Stage II N/A N/A Exudate Amount: Medium N/A N/A Exudate Type: Sanguinous N/A N/A Exudate Color: red N/A N/A Wound Margin: Flat and Intact N/A N/A Granulation Amount: None Present (0%) N/A N/A Necrotic Amount: None Present (0%) N/A N/A Exposed Structures: Fat Layer (Subcutaneous N/A N/A Tissue) Exposed: Yes Fascia: No Tendon:  No Muscle: No Joint: No Bone: No Lindsey Pollard, KARGES. ([id number]) Epithelialization: Large (67-100%) N/A N/A Treatment Notes Electronic Signature(s) Signed: 08/04/2019 4:13:47 PM By: Curtis Sites Entered By: Curtis Sites on 08/04/2019 12:52:21 Lindsey Pollard ([id number]) -------------------------------------------------------------------------------- Multi-Disciplinary Care Plan Details Patient Name: Lindsey Pollard Date of Service: 08/04/2019 12:30 PM Medical Record Number: [id number] Patient Account Number: [id number] Date of Birth/Sex: 1940/11/14 (78 y.o. F) Treating RN: Curtis Sites Primary Care Morganne Haile: Einar Crow Other Clinician: Referring Johnesha Acheampong: Einar Crow Treating [place] Mckeough/Extender: Linwood Dibbles, HOYT Weeks in Treatment: 4 Active Inactive Abuse / Safety / Falls / Self Care Management Nursing Diagnoses: Potential for falls Goals: Patient will remain injury free related to falls Date Initiated: 07/07/2019 Target Resolution Date: 10/03/2019 Goal Status: Active Interventions: Assess fall risk on admission and as needed Notes: Orientation to the Wound Care Program Nursing Diagnoses: Knowledge deficit related to the wound healing center program Goals: Patient/caregiver will verbalize understanding of the Wound Healing Center Program Date Initiated: 07/07/2019 Target Resolution Date: 10/03/2019 Goal Status: Active Interventions: Provide  education on orientation to the wound center Notes: Pressure Nursing Diagnoses: Knowledge deficit related to causes and risk factors for pressure ulcer development Goals: Patient will remain free from development of additional pressure ulcers Date Initiated: 07/07/2019 Target Resolution Date: 10/03/2019 Goal Status: Active Interventions: Assess potential for pressure ulcer upon admission and as needed Lindsey Pollard, FLEAGLE. ([id number]) Provide education on pressure ulcers Notes: Wound/Skin Impairment Nursing Diagnoses: Impaired tissue integrity Goals: Ulcer/skin breakdown will heal within 14 weeks Date Initiated: 07/07/2019 Target Resolution Date: 10/03/2019 Goal Status: Active Interventions: Assess patient/caregiver ability to obtain necessary supplies Assess patient/caregiver ability to perform ulcer/skin care regimen upon admission and as needed Assess ulceration(s) every visit Notes: Electronic Signature(s) Signed: 08/04/2019 4:13:47 PM By: Curtis Sites Entered By: Curtis Sites on 08/04/2019 12:52:13 Lindsey Pollard ([id number]) -------------------------------------------------------------------------------- Pain Assessment Details Patient Name: Lindsey Pollard Date of Service: 08/04/2019 12:30 PM Medical Record Number: [id number] Patient Account Number: [id number] Date of Birth/Sex: Feb 24, 1941 (78 y.o. F) Treating RN: Lindsey Pollard Primary Care Kashlynn Kundert: Einar Crow Other Clinician: Referring Kari Montero: Einar Crow Treating Rui Wordell/Extender: Linwood Dibbles, HOYT Weeks in Treatment: 4 Active Problems Location of Pain Severity and Description of Pain Patient Has Paino No Site Locations Pain Management and Medication Current Pain Management: Electronic Signature(s) Signed: 08/05/2019 11:18:27 AM By: Lindsey Pollard Entered By: Lindsey Pollard on 08/04/2019 12:41:38 Lindsey Pollard  ([id number]) -------------------------------------------------------------------------------- Patient/Caregiver Education Details Patient Name: Lindsey Pollard Date of Service: 08/04/2019 12:30 PM Medical Record Number: [id number] Patient Account Number: [id number] Date of Birth/Gender: [birth year] (78 y.o. F) Treating RN: Curtis Sites Primary Care Physician: Einar Crow Other Clinician: Referring Physician: Einar Crow Treating Physician/Extender: Linwood Dibbles, HOYT Weeks in Treatment: 4 Education Assessment Education Provided To: Patient Education Topics Provided Wound/Skin Impairment: Handouts: Other: wound care as ordered Methods: Demonstration, Explain/Verbal Responses: State content correctly Electronic Signature(s) Signed: 08/04/2019 4:13:47 PM By: Curtis Sites Entered By: Curtis Sites on 08/04/2019 12:56:34 Lindsey Pollard ([id number]) -------------------------------------------------------------------------------- Wound Assessment Details Patient Name: Lindsey Pollard Date of Service: 08/04/2019 12:30 PM Medical Record Number: [id number] Patient Account Number: [id number] Date of Birth/Sex: 1941-04-17 (78 y.o. F) Treating RN: Lindsey Pollard Primary Care Keondrick Dilks: Einar Crow Other Clinician: Referring Lynwood Kubisiak: Einar Crow Treating Ingram Onnen/Extender: Linwood Dibbles, HOYT Weeks in Treatment: 4 Wound Status Wound Number: 1 Primary Pressure Ulcer Etiology: Wound Location: Right Gluteal fold Wound Open Wounding Event: Gradually Appeared Status: Date Acquired: 06/05/2019 Comorbid Cataracts, Hypertension, Myocardial Infarction, Weeks Of Treatment: 4 History: Type  II Diabetes, Osteoarthritis Clustered Wound: No Photos Wound Measurements Length: (cm) 0.2 % Reduction in Width: (cm) 0.3 % Reduction in Depth: (cm) 0.1 Epithelializati Area: (cm) 0.047 Volume: (cm) 0.005 Area: 50% Volume: 44.4% on: Large (67-100%) Wound Description Classification:  Category/Stage II Foul Odor Afte Wound Margin: Flat and Intact Slough/Fibrino Exudate Amount: Medium Exudate Type: Sanguinous Exudate Color: red r Cleansing: No No Wound Bed Granulation Amount: None Present (0%) Exposed Structure Necrotic Amount: None Present (0%) Fascia Exposed: No Fat Layer (Subcutaneous Tissue) Exposed: Yes Tendon Exposed: No Muscle Exposed: No Joint Exposed: No Bone Exposed: No Treatment Notes Lindsey Pollard, YETTER. ([id number]) Wound #1 (Right Gluteal fold) Notes hydrofera blue, BFD Electronic Signature(s) Signed: 08/05/2019 11:18:27 AM By: Lindsey Pollard Entered By: Lindsey Pollard on 08/04/2019 12:45:08 Lindsey Pollard ([id number]) -------------------------------------------------------------------------------- Vitals Details Patient Name: Lindsey Pollard Date of Service: 08/04/2019 12:30 PM Medical Record Number: [id number] Patient Account Number: [id number] Date of Birth/Sex: [birth year] (79 y.o. F) Treating RN: Lindsey Pollard Primary Care Tiahna Cure: Einar Crow Other Clinician: Referring Kadiatou Oplinger: Einar Crow Treating Tyrik Stetzer/Extender: Linwood Dibbles, HOYT Weeks in Treatment: 4 Vital Signs Time Taken: 12:41 Temperature (F): 97.6 Height (in): 59 Pulse (bpm): 72 Weight (lbs): 165 Respiratory Rate (breaths/min): 16 Body Mass Index (BMI): 33.3 Blood Pressure (mmHg): 168/42 Reference Range: 80 - 120 mg / dl Electronic Signature(s) Signed: 08/05/2019 11:18:27 AM By: Lindsey Pollard Entered By: Lindsey Pollard on 08/04/2019 12:42:45

## 2019-08-11 ENCOUNTER — Encounter: Payer: Medicare Other | Admitting: Physician Assistant

## 2019-08-11 ENCOUNTER — Other Ambulatory Visit: Payer: Self-pay

## 2019-08-11 DIAGNOSIS — E11622 Type 2 diabetes mellitus with other skin ulcer: Secondary | ICD-10-CM | POA: Diagnosis not present

## 2019-08-11 NOTE — Progress Notes (Addendum)
Lindsey Pollard ([id number]) Visit Report for 08/11/2019 Chief Complaint Document Details Patient Name: Lindsey Pollard Date of Service: 08/11/2019 3:15 PM Medical Record Number: [id number] Patient Account Number: [id number] Date of Birth/Sex: 1940-07-01 (78 y.o. F) Treating RN: Curtis Sites Primary Care Provider: Einar Crow Other Clinician: Referring Provider: Einar Crow Treating Provider/Extender: Linwood Dibbles, Doreatha Offer Weeks in Treatment: 5 Information Obtained from: Patient Chief Complaint Right gluteal fold pressure ulcer Electronic Signature(s) Signed: 08/11/2019 3:15:49 PM By: Lenda Kelp PA-C Entered By: Lenda Kelp on 08/11/2019 15:15:48 Lindsey Pollard ([id number]) -------------------------------------------------------------------------------- HPI Details Patient Name: Lindsey Pollard Date of Service: 08/11/2019 3:15 PM Medical Record Number: [id number] Patient Account Number: [id number] Date of Birth/Sex: Nov 17, 1940 (78 y.o. F) Treating RN: Curtis Sites Primary Care Provider: Einar Crow Other Clinician: Referring Provider: Einar Crow Treating Provider/Extender: Linwood Dibbles, Linzy Laury Weeks in Treatment: 5 History of Present Illness HPI Description: 05/06/2020 on evaluation today patient presents for initial inspection here in our clinic concerning an issue that she is having with a wound in the right gluteal fold region. She has been tolerating some dressing changes she has been performing at home mainly she been putting a little bit of ointment on this and keeping it covered. She notes that this has been present for about 2 months and she is noted some improvement compared to where it started but unfortunately not a significant amount to where she is pleased with how things have gone. Nonetheless she did come in to be seen in order to see if there is anything we can do to aid in helping the area to heal more effectively and quickly. Fortunately  there is no signs of active infection at this time. No fevers, chills, nausea, vomiting, or diarrhea. She has diabetic and on 05/04/19 her most recent reading of 7.9. She also has a history of heart disease of note. She also has hypertension. Her blood pressure is elevated today as well. She follows up with her primary care provider on a regular basis in regard to this. 07/17/2019 on evaluation today patient appears to be doing well with regard to her wound at this time. She has been tolerating the dressing changes without complication. Fortunately she is able to do this herself and seems to be doing quite well. There does not appear to be any signs of active infection at this time which is good news. No fevers, chills, nausea, vomiting, or diarrhea. 07/28/2019 on evaluation today patient appears to be doing a little bit worse today compared to previously noted in regard to her wound. Unfortunately she seems to be having slight hypergranulation I am not exactly sure why this is. Fortunately there is no signs of active infection at this time. No fevers, chills, nausea, vomiting, or diarrhea. With that being said it may be that this is more of a moisture issue this is somewhat in a crease in the gluteal fold region unfortunately that is making things a little bit more severe in my opinion. 08/04/2019 upon evaluation today patient appears to be doing excellent in regard to her wound. In fact this is almost completely healed and seems to have made great progress since last week. Fortunately there is no signs of active infection at this time. 08/11/2019 upon evaluation today patient actually appears to be completely healed she is doing excellent at this point. Fortunately there is no signs of active infection which is also great news. I am very pleased with how things are progressing. Electronic Signature(s) Signed: 08/11/2019  3:19:54 PM By: Lenda Kelp PA-C Entered By: Lenda Kelp on 08/11/2019  15:19:54 Lindsey Pollard ([id number]) -------------------------------------------------------------------------------- Physical Exam Details Patient Name: Lindsey Pollard Date of Service: 08/11/2019 3:15 PM Medical Record Number: [id number] Patient Account Number: [id number] Date of Birth/Sex: [birth year] (78 y.o. F) Treating RN: Curtis Sites Primary Care Provider: Einar Crow Other Clinician: Referring Provider: Einar Crow Treating Provider/Extender: [name], Kanita Delage Weeks in Treatment: 5 Constitutional Well-nourished and well-hydrated in no acute distress. Respiratory normal breathing without difficulty. Psychiatric this patient is able to make decisions and demonstrates good insight into disease process. Alert and Oriented x 3. pleasant and cooperative. Notes Patient's wound area actually appears to show signs of complete epithelialization and closure there is no evidence of anything draining or open we will likely use a protective dressing just to keep anything from rubbing for at least the next week or so but other than that she is doing quite well Electronic Signature(s) Signed: 08/11/2019 3:20:11 PM By: Lenda Kelp PA-C Entered By: Lenda Kelp on 08/11/2019 15:20:10 Lindsey Pollard ([id number]) -------------------------------------------------------------------------------- Physician Orders Details Patient Name: Lindsey Pollard Date of Service: 08/11/2019 3:15 PM Medical Record Number: [id number] Patient Account Number: [id number] Date of Birth/Sex: 1940-09-13 (78 y.o. F) Treating RN: Curtis Sites Primary Care Provider: Einar Crow Other Clinician: Referring Provider: Einar Crow Treating Provider/Extender: Linwood Dibbles, Gerrianne Aydelott Weeks in Treatment: 5 Verbal / Phone Orders: No Diagnosis Coding ICD-10 Coding Code Description L89.312 Pressure ulcer of right buttock, stage 2 E11.622 Type 2 diabetes mellitus with other skin ulcer I10 Essential  (primary) hypertension I25.10 Atherosclerotic heart disease of native coronary artery without angina pectoris Discharge From [facility] Services o Discharge from Wound Care Center Electronic Signature(s) Signed: 08/11/2019 4:37:42 PM By: Curtis Sites Signed: 08/11/2019 5:42:48 PM By: Lenda Kelp PA-C Entered By: Curtis Sites on 08/11/2019 15:18:34 Lindsey Pollard ([id number]) -------------------------------------------------------------------------------- Problem List Details Patient Name: Lindsey Pollard Date of Service: 08/11/2019 3:15 PM Medical Record Number: [id number] Patient Account Number: [id number] Date of Birth/Sex: 1941/03/06 (78 y.o. F) Treating RN: Curtis Sites Primary Care Provider: Einar Crow Other Clinician: Referring Provider: Einar Crow Treating Provider/Extender: Linwood Dibbles, Valor [place] Weeks in Treatment: 5 Active Problems ICD-10 Evaluated Encounter Code Description Active Date Today Diagnosis L89.312 Pressure ulcer of right buttock, stage 2 07/07/2019 No Yes E11.622 Type 2 diabetes mellitus with other skin ulcer 07/07/2019 No Yes I10 Essential (primary) hypertension 07/07/2019 No Yes I25.10 Atherosclerotic heart disease of native coronary artery 07/07/2019 No Yes without angina pectoris Inactive Problems Resolved Problems Electronic Signature(s) Signed: 08/11/2019 3:15:43 PM By: Lenda Kelp PA-C Entered By: Lenda Kelp on 08/11/2019 15:15:42 Lindsey Pollard ([id number]) -------------------------------------------------------------------------------- Progress Note Details Patient Name: Lindsey Pollard Date of Service: 08/11/2019 3:15 PM Medical Record Number: [id number] Patient Account Number: [id number] Date of Birth/Sex: 22-Aug-1940 (78 y.o. F) Treating RN: Curtis Sites Primary Care Provider: Einar Crow Other Clinician: Referring Provider: Einar Crow Treating Provider/Extender: Linwood Dibbles, Adyn Serna Weeks in Treatment:  5 Subjective Chief Complaint Information obtained from Patient Right gluteal fold pressure ulcer History of Present Illness (HPI) 05/06/2020 on evaluation today patient presents for initial inspection here in our clinic concerning an issue that she is having with a wound in the right gluteal fold region. She has been tolerating some dressing changes she has been performing at home mainly she been putting a little bit of ointment on this and keeping it covered. She notes that this has been present for about 2 months and  she is noted some improvement compared to where it started but unfortunately not a significant amount to where she is pleased with how things have gone. Nonetheless she did come in to be seen in order to see if there is anything we can do to aid in helping the area to heal more effectively and quickly. Fortunately there is no signs of active infection at this time. No fevers, chills, nausea, vomiting, or diarrhea. She has diabetic and on 05/04/19 her most recent reading of 7.9. She also has a history of heart disease of note. She also has hypertension. Her blood pressure is elevated today as well. She follows up with her primary care provider on a regular basis in regard to this. 07/17/2019 on evaluation today patient appears to be doing well with regard to her wound at this time. She has been tolerating the dressing changes without complication. Fortunately she is able to do this herself and seems to be doing quite well. There does not appear to be any signs of active infection at this time which is good news. No fevers, chills, nausea, vomiting, or diarrhea. 07/28/2019 on evaluation today patient appears to be doing a little bit worse today compared to previously noted in regard to her wound. Unfortunately she seems to be having slight hypergranulation I am not exactly sure why this is. Fortunately there is no signs of active infection at this time. No fevers, chills, nausea,  vomiting, or diarrhea. With that being said it may be that this is more of a moisture issue this is somewhat in a crease in the gluteal fold region unfortunately that is making things a little bit more severe in my opinion. 08/04/2019 upon evaluation today patient appears to be doing excellent in regard to her wound. In fact this is almost completely healed and seems to have made great progress since last week. Fortunately there is no signs of active infection at this time. 08/11/2019 upon evaluation today patient actually appears to be completely healed she is doing excellent at this point. Fortunately there is no signs of active infection which is also great news. I am very pleased with how things are progressing. Objective Constitutional Well-nourished and well-hydrated in no acute distress. Vitals Time Taken: 3:05 PM, Height: 59 in, Weight: 165 lbs, BMI: 33.3, Temperature: 97.9 F, Pulse: 59 bpm, Respiratory Deskin, Aileana M. ([id number]) Rate: 16 breaths/min, Blood Pressure: 185/47 mmHg. Respiratory normal breathing without difficulty. Psychiatric this patient is able to make decisions and demonstrates good insight into disease process. Alert and Oriented x 3. pleasant and cooperative. General Notes: Patient's wound area actually appears to show signs of complete epithelialization and closure there is no evidence of anything draining or open we will likely use a protective dressing just to keep anything from rubbing for at least the next week or so but other than that she is doing quite well Integumentary (Hair, Skin) Wound #1 status is Healed - Epithelialized. Original cause of wound was Gradually Appeared. The wound is located on the Right Gluteal fold. The wound measures 0cm length x 0cm width x 0cm depth; 0cm^2 area and 0cm^3 volume. There is a none present amount of drainage noted. The wound margin is flat and intact. There is no granulation within the wound bed. There is no necrotic  tissue within the wound bed. Assessment Active Problems ICD-10 Pressure ulcer of right buttock, stage 2 Type 2 diabetes mellitus with other skin ulcer Essential (primary) hypertension Atherosclerotic heart disease of native coronary artery without  angina pectoris Plan Discharge From [facility] Services: Discharge from Wound Care Center 1. I would recommend that we discontinue wound care services as the patient appears to be completely healed she is in agreement with that plan. 2. I am also going to suggest that we continue with appropriate offloading measures to prevent any additional breakdown to the wound location and the patient is in agreement in that regard as well. We will see her back for follow-up visit as needed. Electronic Signature(s) Signed: 08/11/2019 3:20:34 PM By: Parthenia Ames, [place] ([id number]) Entered By: Lenda Kelp on 08/11/2019 15:20:34 Lindsey Pollard ([id number]) -------------------------------------------------------------------------------- SuperBill Details Patient Name: Lindsey Pollard Date of Service: 08/11/2019 Medical Record Number: [id number] Patient Account Number: [id number] Date of Birth/Sex: Jul 29, 1940 (78 y.o. F) Treating RN: Curtis Sites Primary Care Provider: Einar Crow Other Clinician: Referring Provider: Einar Crow Treating Provider/Extender: Linwood Dibbles, Josslynn Mentzer Weeks in Treatment: 5 Diagnosis Coding ICD-10 Codes Code Description [phone] Pressure ulcer of right buttock, stage 2 E11.622 Type 2 diabetes mellitus with other skin ulcer I10 Essential (primary) hypertension I25.10 Atherosclerotic heart disease of native coronary artery without angina pectoris Facility Procedures CPT4 Code: [id number] Description: [phone] - WOUND CARE VISIT-LEV 2 EST PT Modifier: Quantity: 1 Physician Procedures CPT4 Code: [id number] Description: 99213 - WC PHYS LEVEL 3 - EST PT ICD-10 Diagnosis Description L89.312 Pressure ulcer of right  buttock, stage 2 E11.622 Type 2 diabetes mellitus with other skin ulcer I10 Essential (primary) hypertension Modifier: Quantity: 1 Electronic Signature(s) Signed: 08/11/2019 4:37:42 PM By: Curtis Sites Signed: 08/11/2019 5:42:48 PM By: Lenda Kelp PA-C Previous Signature: 08/11/2019 3:20:44 PM Version By: Lenda Kelp PA-C Entered By: Curtis Sites on 08/11/2019 15:22:44

## 2019-08-12 NOTE — Progress Notes (Signed)
Lindsey, Pollard ([id number]) Visit Report for 08/11/2019 Arrival Information Details Patient Name: Lindsey Pollard, Lindsey Pollard Date of Service: 08/11/2019 3:15 PM Medical Record Number: [id number] Patient Account Number: [id number] Date of Birth/Sex: 01-Feb-1941 (78 y.o. F) Treating RN: Huel Coventry Primary Care Lindsey Pollard: Lindsey Pollard Other Clinician: Referring Laquashia Mergenthaler: Lindsey Pollard Treating Nevada Kirchner/Extender: Linwood Dibbles, HOYT Weeks in Treatment: 5 Visit Information History Since Last Visit Added or deleted any medications: No Patient Arrived: Ambulatory Has Dressing in Place as Prescribed: Yes Arrival Time: 15:04 Pain Present Now: No Accompanied By: self Transfer Assistance: None Patient Identification Verified: Yes Secondary Verification Process Completed: Yes Electronic Signature(s) Signed: 08/12/2019 5:27:48 PM By: Elliot Gurney, BSN, RN, CWS, Kim RN, BSN Entered By: Elliot Gurney, BSN, RN, CWS, Kim on 08/11/2019 15:05:16 Lindsey Pollard ([id number]) -------------------------------------------------------------------------------- Clinic Level of Care Assessment Details Patient Name: Lindsey Pollard Date of Service: 08/11/2019 3:15 PM Medical Record Number: [id number] Patient Account Number: [id number] Date of Birth/Sex: Feb 07, 1941 (78 y.o. F) Treating RN: Curtis Sites Primary Care Lindsey Pollard: Lindsey Pollard Other Clinician: Referring Lindsey Pollard: Lindsey Pollard Treating Lindsey Pollard/Extender: Linwood Dibbles, HOYT Weeks in Treatment: 5 Clinic Level of Care Assessment Items TOOL 4 Quantity Score []  - Use when only an EandM is performed on FOLLOW-UP visit 0 ASSESSMENTS - Nursing Assessment / Reassessment X - Reassessment of Co-morbidities (includes updates in patient status) 1 10 X- 1 5 Reassessment of Adherence to Treatment Plan ASSESSMENTS - Wound and Skin Assessment / Reassessment X - Simple Wound Assessment / Reassessment - one wound 1 5 []  - 0 Complex Wound Assessment / Reassessment - multiple  wounds []  - 0 Dermatologic / Skin Assessment (not related to wound area) ASSESSMENTS - Focused Assessment []  - Circumferential Edema Measurements - multi extremities 0 []  - 0 Nutritional Assessment / Counseling / Intervention []  - 0 Lower Extremity Assessment (monofilament, tuning fork, pulses) []  - 0 Peripheral Arterial Disease Assessment (using hand held doppler) ASSESSMENTS - Ostomy and/or Continence Assessment and Care []  - Incontinence Assessment and Management 0 []  - 0 Ostomy Care Assessment and Management (repouching, etc.) PROCESS - Coordination of Care X - Simple Patient / Family Education for ongoing care 1 15 []  - 0 Complex (extensive) Patient / Family Education for ongoing care X- 1 10 Staff obtains , Records, Test Results / Process Orders []  - 0 Staff telephones HHA, Nursing Homes / Clarify orders / etc []  - 0 Routine Transfer to another Facility (non-emergent condition) []  - 0 Routine Hospital Admission (non-emergent condition) []  - 0 New Admissions / / Ordering NPWT, Apligraf, etc. []  - 0 Emergency Hospital Admission (emergent condition) X- 1 10 Simple Discharge Coordination ISA, HITZ. ( ) []  - 0 Complex (extensive) Discharge Coordination PROCESS - Special Needs []  - Pediatric / Minor Patient Management 0 []  - 0 Isolation Patient Management []  - 0 Hearing / Language / Visual special needs []  - 0 Assessment of Community assistance (transportation, D/C planning, etc.) []  - 0 Additional assistance / Altered mentation []  - 0 Support Surface(s) Assessment (bed, cushion, seat, etc.) INTERVENTIONS - Wound Cleansing / Measurement X - Simple Wound Cleansing - one wound 1 5 []  - 0 Complex Wound Cleansing - multiple wounds X- 1 5 Wound Imaging (photographs - any number of wounds) []  - 0 Wound Tracing (instead of photographs) X- 1 5 Simple Wound Measurement - one wound []  - 0 Complex Wound Measurement -  multiple wounds INTERVENTIONS - Wound Dressings []  - Small Wound Dressing one or multiple wounds 0 []  - 0 Medium  Wound Dressing one or multiple wounds []  - 0 Large Wound Dressing one or multiple wounds []  - 0 Application of Medications - topical []  - 0 Application of Medications - injection INTERVENTIONS - Miscellaneous []  - External ear exam 0 []  - 0 Specimen Collection (cultures, biopsies, blood, body fluids, etc.) []  - 0 Specimen(s) / Culture(s) sent or taken to Lab for analysis []  - 0 Patient Transfer (multiple staff / / Similar devices) []  - 0 Simple Staple / Suture removal (25 or less) []  - 0 Complex Staple / Suture removal (26 or more) []  - 0 Hypo / Hyperglycemic Management (close monitor of Blood Glucose) []  - 0 Ankle / Brachial Index (ABI) - do not check if billed separately X- 1 5 Vital Signs Apollo, Dorinne M. ( ) Has the patient been seen at the hospital within the last three years: Yes Total Score: 75 Level Of Care: New/Established - Level 2 Electronic Signature(s) Signed: 08/11/2019 4:37:42 PM By: Entered By: on 08/11/2019 15:22:31 (Nurse, adult) -------------------------------------------------------------------------------- Encounter Discharge Information Details Patient Name: Date of Service: 08/11/2019 3:15 PM Medical Record Number: Patient Account Number: Date of Birth/Sex: Aug 05, 1940 (78 y.o. F) Treating RN: Curtis Sites Primary Care Lindsey Pollard: Curtis Sites Other Clinician: Referring Lindsey Pollard: 08/13/2019 Treating Lindsey Pollard/Extender: Lindsey Pollard, HOYT Weeks in Treatment: 5 Encounter Discharge Information Items Discharge Condition: Stable Ambulatory Status: Ambulatory Discharge Destination: Home Transportation: Private Auto Accompanied By: self Schedule Follow-up Appointment: No Clinical Summary of Care: Electronic Signature(s) Signed:  08/11/2019 3:23:43 PM By: Lindsey Pollard Entered By: 08/13/2019 on 08/11/2019 15:23:43 [id number] (11/09/1940) -------------------------------------------------------------------------------- Lower Extremity Assessment Details Patient Name: 03-02-1992 Date of Service: 08/11/2019 3:15 PM Medical Record Number: Lindsey Pollard Patient Account Number: Lindsey Pollard Date of Birth/Sex: 1941/04/12 (78 y.o. F) Treating RN: Curtis Sites Primary Care Jaber Dunlow: Curtis Sites Other Clinician: Referring Gamal Todisco: 08/13/2019 Treating Maliha Outten/Extender: Lindsey Pollard, HOYT Weeks in Treatment: 5 Electronic Signature(s) Signed: 08/12/2019 5:27:48 PM By: Lindsey Pollard, BSN, RN, CWS, Kim RN, BSN Entered By: 08/13/2019, BSN, RN, CWS, Kim on 08/11/2019 15:09:55 [id number] (11/09/1940) -------------------------------------------------------------------------------- Multi Wound Chart Details Patient Name: 03-02-1992 Date of Service: 08/11/2019 3:15 PM Medical Record Number: Lindsey Pollard Patient Account Number: Lindsey Pollard Date of Birth/Sex: 1941-05-05 (78 y.o. F) Treating RN: Elliot Gurney Primary Care Jess Toney: Elliot Gurney Other Clinician: Referring Keiaira Donlan: 08/13/2019 Treating Ebubechukwu Jedlicka/Extender: Lindsey Pollard, HOYT Weeks in Treatment: 5 Vital Signs Height(in): 59 Pulse(bpm): 59 Weight(lbs): 165 Blood Pressure(mmHg): 185/47 Body Mass Index(BMI): 33 Temperature(F): 97.9 Respiratory Rate 16 (breaths/min): Photos: [N/A:N/A] Wound Location: Right Gluteal fold N/A N/A Wounding Event: Gradually Appeared N/A N/A Primary Etiology: Pressure Ulcer N/A N/A Comorbid History: Cataracts, Hypertension, N/A N/A Myocardial Infarction, Type II Diabetes, Osteoarthritis Date Acquired: 06/05/2019 N/A N/A Weeks of Treatment: 5 N/A N/A Wound Status: Healed - Epithelialized N/A N/A Measurements L x W x D 0x0x0 N/A N/A (cm) Area (cm) : 0 N/A N/A Volume (cm) : 0 N/A N/A % Reduction in Area:  100.00% N/A N/A % Reduction in Volume: 100.00% N/A N/A Classification: Category/Stage II N/A N/A Exudate Amount: None Present N/A N/A Wound Margin: Flat and Intact N/A N/A Granulation Amount: None Present (0%) N/A N/A Necrotic Amount: None Present (0%) N/A N/A Exposed Structures: Fascia: No N/A N/A Fat Layer (Subcutaneous Tissue) Exposed: No Tendon: No Muscle: No Joint: No Bone: No Epithelialization: Large (67-100%) N/A N/A SHAENA, PARKERSON (08/13/2019) Treatment Notes Electronic Signature(s) Signed: 08/11/2019 4:37:42 PM By:  Dorthy, Mardene Celeste Entered By: Curtis Sites on 08/11/2019 15:18:22 Lindsey Pollard ([id number]) -------------------------------------------------------------------------------- Multi-Disciplinary Care Plan Details Patient Name: Lindsey Pollard Date of Service: 08/11/2019 3:15 PM Medical Record Number: [id number] Patient Account Number: [id number] Date of Birth/Sex: 01-Feb-1941 (78 y.o. F) Treating RN: Curtis Sites Primary Care Selah Klang: Lindsey Pollard Other Clinician: Referring Etherine Mackowiak: Lindsey Pollard Treating Mayda Shippee/Extender: Linwood Dibbles, HOYT Weeks in Treatment: 5 Active Inactive Electronic Signature(s) Signed: 08/11/2019 4:37:42 PM By: Curtis Sites Entered By: Curtis Sites on 08/11/2019 15:18:14 Lindsey Pollard ([id number]) -------------------------------------------------------------------------------- Pain Assessment Details Patient Name: Lindsey Pollard Date of Service: 08/11/2019 3:15 PM Medical Record Number: [id number] Patient Account Number: [id number] Date of Birth/Sex: 10/27/1940 (78 y.o. F) Treating RN: Huel Coventry Primary Care Franchot Pollitt: Lindsey Pollard Other Clinician: Referring Kenny Stern: Lindsey Pollard Treating Zivah Mayr/Extender: Linwood Dibbles, HOYT Weeks in Treatment: 5 Active Problems Location of Pain Severity and Description of Pain Patient Has Paino No Site Locations Pain Management and Medication Current Pain  Management: Notes Patient denies pain at this time. Electronic Signature(s) Signed: 08/12/2019 5:27:48 PM By: Elliot Gurney, BSN, RN, CWS, Kim RN, BSN Entered By: Elliot Gurney, BSN, RN, CWS, Kim on 08/11/2019 15:05:33 Lindsey Pollard ([id number]) -------------------------------------------------------------------------------- Patient/Caregiver Education Details Patient Name: Lindsey Pollard Date of Service: 08/11/2019 3:15 PM Medical Record Number: [id number] Patient Account Number: [id number] Date of Birth/Gender: 25-Sep-1940 (78 y.o. F) Treating RN: Curtis Sites Primary Care Physician: Lindsey Pollard Other Clinician: Referring Physician: Einar Pollard Treating Physician/Extender: Linwood Dibbles, HOYT Weeks in Treatment: 5 Education Assessment Education Provided To: Patient Education Topics Provided Basic Hygiene: Handouts: Other: care of newly healed wound site Methods: Explain/Verbal Responses: State content correctly Electronic Signature(s) Signed: 08/11/2019 4:37:42 PM By: Curtis Sites Entered By: Curtis Sites on 08/11/2019 15:23:00 Lindsey Pollard ([id number]) -------------------------------------------------------------------------------- Wound Assessment Details Patient Name: Lindsey Pollard Date of Service: 08/11/2019 3:15 PM Medical Record Number: [id number] Patient Account Number: [id number] Date of Birth/Sex: 09/24/1940 (78 y.o. F) Treating RN: Curtis Sites Primary Care Faith Patricelli: Lindsey Pollard Other Clinician: Referring Wynetta Seith: Lindsey Pollard Treating Batina Dougan/Extender: Linwood Dibbles, HOYT Weeks in Treatment: 5 Wound Status Wound Number: 1 Primary Pressure Ulcer Etiology: Wound Location: Right Gluteal fold Wound Healed - Epithelialized Wounding Event: Gradually Appeared Status: Date Acquired: 06/05/2019 Comorbid Cataracts, Hypertension, Myocardial Infarction, Weeks Of Treatment: 5 History: Type II Diabetes, Osteoarthritis Clustered Wound: No Photos Wound  Measurements Length: (cm) 0 Width: (cm) 0 Depth: (cm) 0 Area: (cm) 0 Volume: (cm) 0 % Reduction in Area: 100% % Reduction in Volume: 100% Epithelialization: Large (67-100%) Wound Description Classification: Category/Stage II Foul O Wound Margin: Flat and Intact Slough Exudate Amount: None Present dor After Cleansing: No /Fibrino No Wound Bed Granulation Amount: None Present (0%) Exposed Structure Necrotic Amount: None Present (0%) Fascia Exposed: No Fat Layer (Subcutaneous Tissue) Exposed: No Tendon Exposed: No Muscle Exposed: No Joint Exposed: No Bone Exposed: No Electronic Signature(s) Signed: 08/11/2019 4:37:42 PM By: Curtis Sites Entered By: Curtis Sites on 08/11/2019 15:18:01 Lindsey Pollard ([id number] Muster, Durene Romans ([id number]) -------------------------------------------------------------------------------- Vitals Details Patient Name: Lindsey Pollard Date of Service: 08/11/2019 3:15 PM Medical Record Number: [id number] Patient Account Number: [id number] Date of Birth/Sex: [birth year] (79 y.o. F) Treating RN: Huel Coventry Primary Care Zakkary Thibault: Lindsey Pollard Other Clinician: Referring Trista Ciocca: Lindsey Pollard Treating Sirron Francesconi/Extender: Linwood Dibbles, HOYT Weeks in Treatment: 5 Vital Signs Time Taken: 15:05 Temperature (F): 97.9 Height (in): 59 Pulse (bpm): 59 Weight (lbs): 165 Respiratory Rate (breaths/min): 16 Body Mass Index (BMI): 33.3 Blood Pressure (mmHg): 185/47 Reference Range: 80 -  120 mg / dl Electronic Signature(s) Signed: 08/12/2019 5:27:48 PM By: Gretta Cool, BSN, RN, CWS, Kim RN, BSN Entered By: Gretta Cool, BSN, RN, CWS, Kim on 08/11/2019 15:09:14

## 2020-01-11 ENCOUNTER — Other Ambulatory Visit: Payer: Self-pay | Admitting: Internal Medicine

## 2020-01-11 DIAGNOSIS — Z1231 Encounter for screening mammogram for malignant neoplasm of breast: Secondary | ICD-10-CM

## 2020-02-03 ENCOUNTER — Other Ambulatory Visit: Payer: Self-pay

## 2020-02-03 ENCOUNTER — Ambulatory Visit
Admission: RE | Admit: 2020-02-03 | Discharge: 2020-02-03 | Disposition: A | Discharge status: Home / Self Care | Payer: Medicare Other | Source: Ambulatory Visit | Attending: Internal Medicine | Admitting: Internal Medicine

## 2020-02-03 DIAGNOSIS — Z1231 Encounter for screening mammogram for malignant neoplasm of breast: Secondary | ICD-10-CM | POA: Insufficient documentation

## 2020-02-03 IMAGING — MG DIGITAL SCREENING BILAT W/ TOMO W/ CAD
8 series · 8 of 24 positions shown · non-contrast
Comparison: None.

CLINICAL DATA: Screening.

EXAM:
DIGITAL SCREENING BILATERAL MAMMOGRAM WITH TOMO AND CAD

[L CC synth-2D]
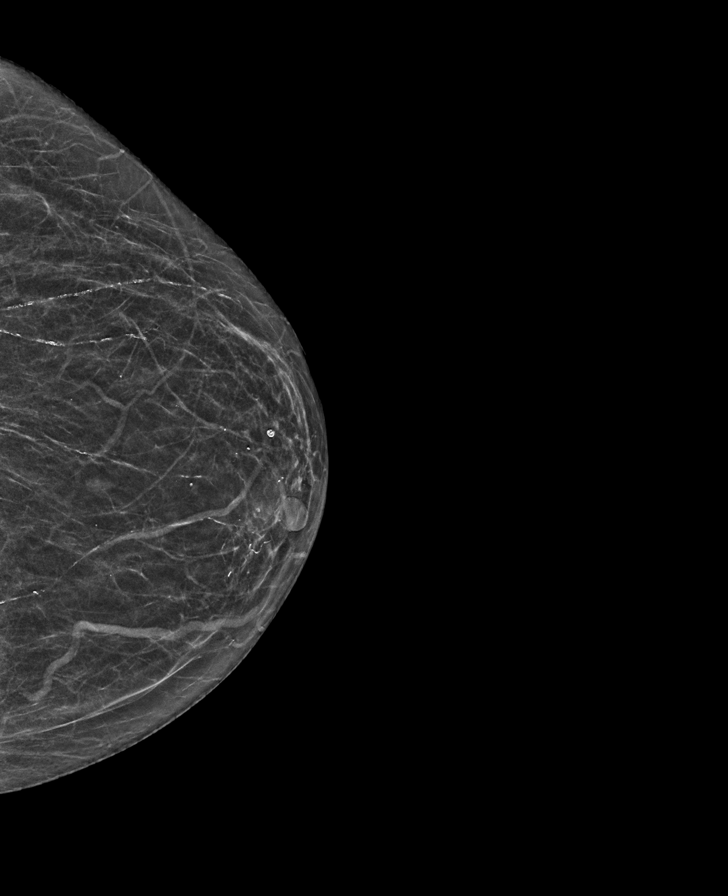

[R CC synth-2D]
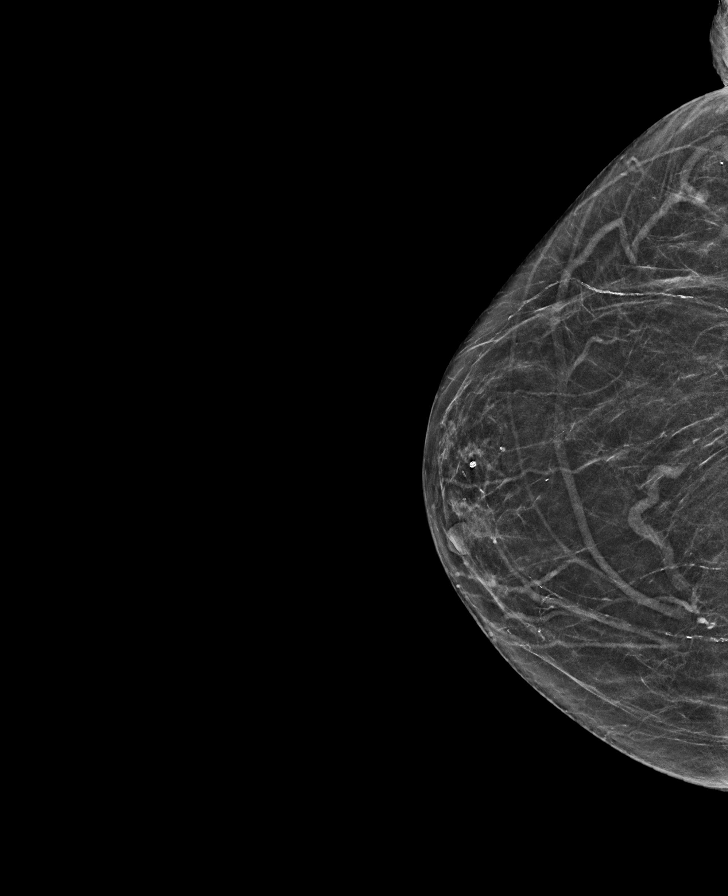

[L MLO synth-2D]
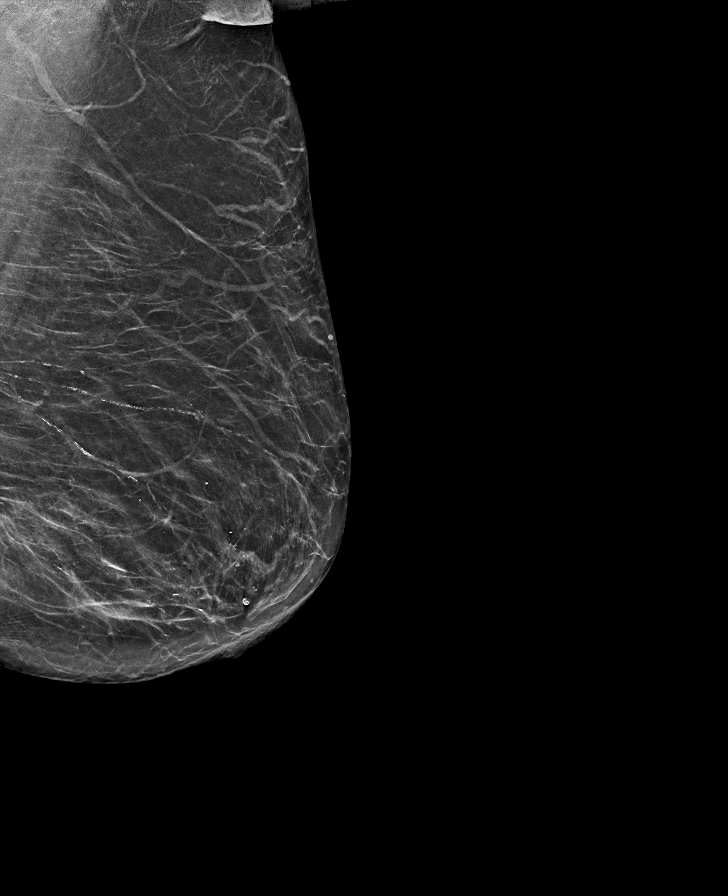

[R MLO synth-2D]
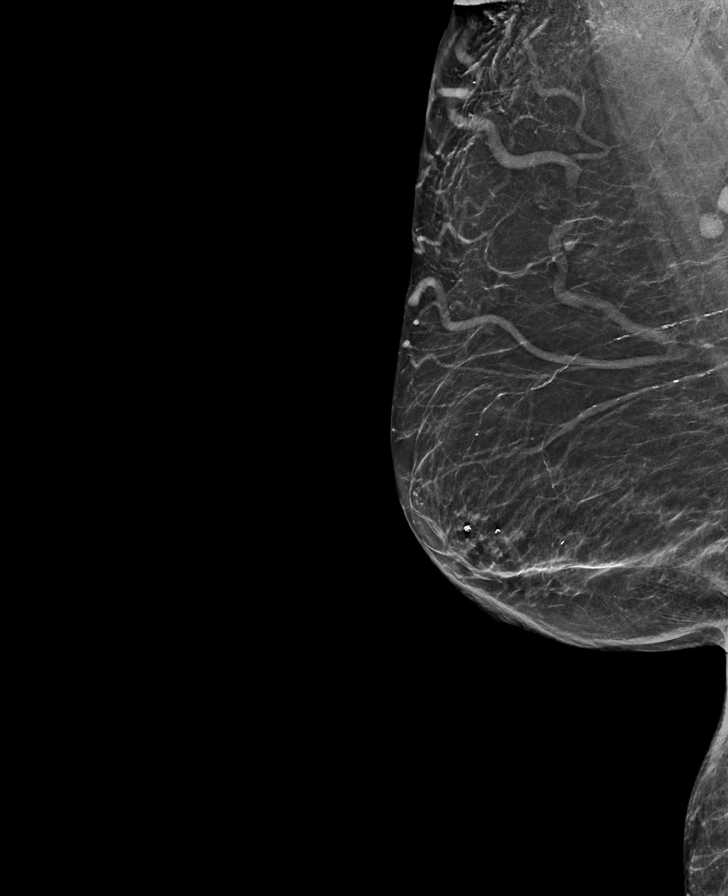

[R MLO tomo · tomo slice 27/53.0]
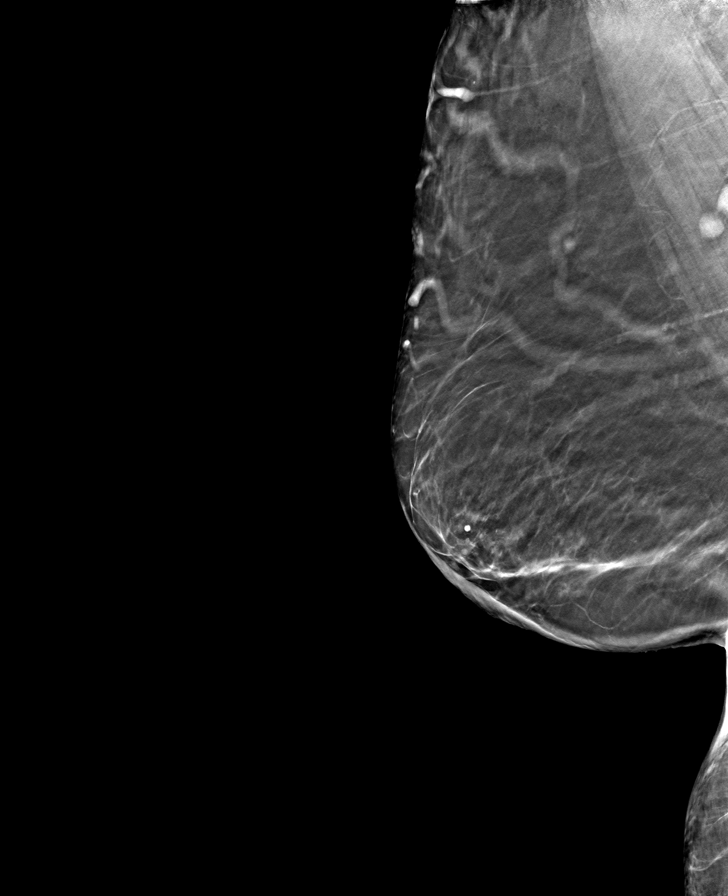

[R CC tomo · tomo slice 27/52.0]
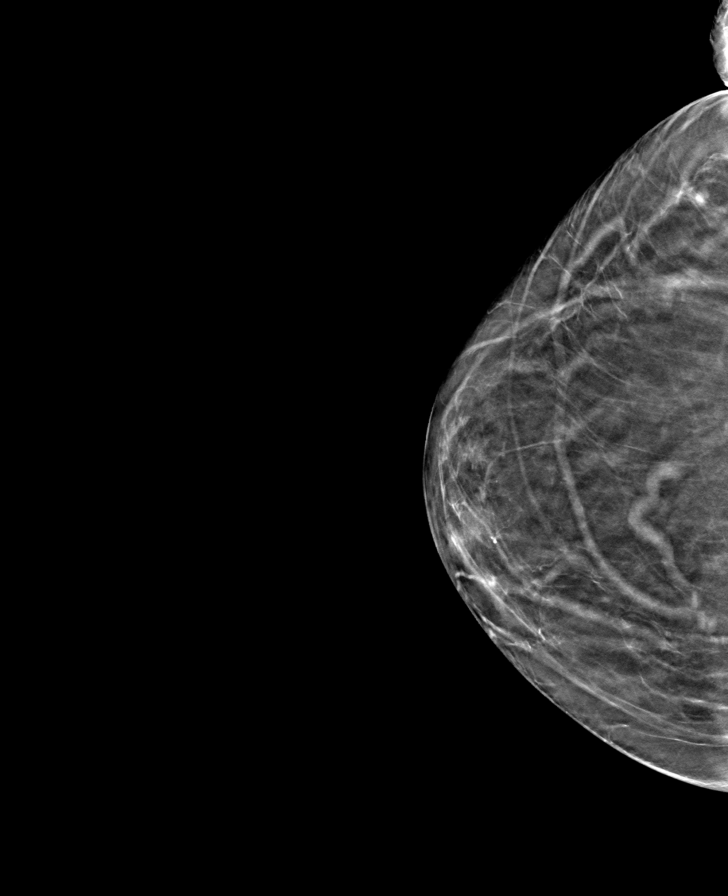

[L MLO tomo · tomo slice 31/62.0]
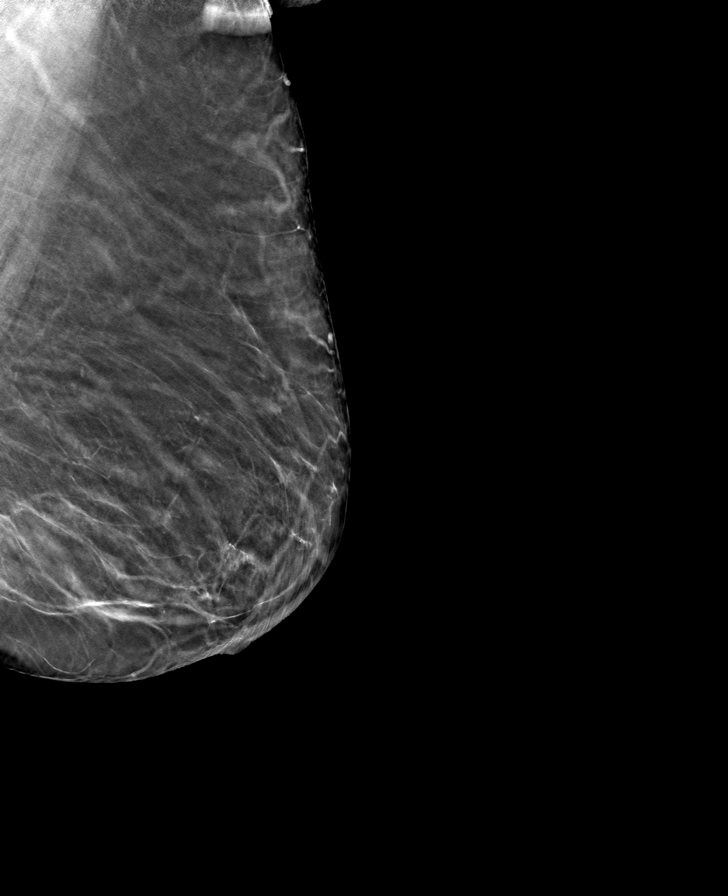

[L CC tomo · tomo slice 24/47.0]
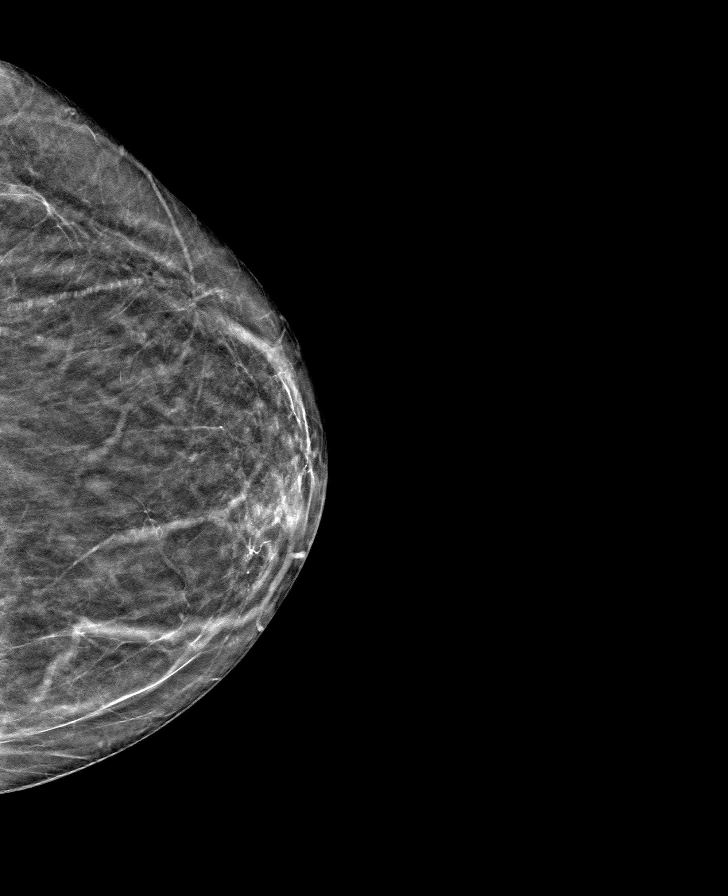

[8 of 24 positions shown; findings below may reference images not displayed]

ACR Breast Density Category b: There are scattered areas of
fibroglandular density.
FINDINGS: There are no findings suspicious for malignancy. Images were
processed with CAD.
IMPRESSION: No mammographic evidence of malignancy. A result letter of this
screening mammogram will be mailed directly to the patient.

RECOMMENDATION:
Screening mammogram in one year. (Code:[1T])

BI-RADS CATEGORY  1: Negative.

## 2020-06-10 ENCOUNTER — Ambulatory Visit: Payer: Medicare Other | Admitting: Physician Assistant

## 2020-06-28 ENCOUNTER — Other Ambulatory Visit (INDEPENDENT_AMBULATORY_CARE_PROVIDER_SITE_OTHER): Payer: Self-pay | Admitting: Vascular Surgery

## 2020-06-28 DIAGNOSIS — I739 Peripheral vascular disease, unspecified: Secondary | ICD-10-CM

## 2020-06-30 ENCOUNTER — Other Ambulatory Visit: Payer: Self-pay

## 2020-06-30 ENCOUNTER — Encounter: Payer: Medicare Other | Attending: Physician Assistant | Admitting: Physician Assistant

## 2020-06-30 DIAGNOSIS — I2510 Atherosclerotic heart disease of native coronary artery without angina pectoris: Secondary | ICD-10-CM | POA: Insufficient documentation

## 2020-06-30 DIAGNOSIS — L98492 Non-pressure chronic ulcer of skin of other sites with fat layer exposed: Secondary | ICD-10-CM | POA: Insufficient documentation

## 2020-06-30 DIAGNOSIS — E11622 Type 2 diabetes mellitus with other skin ulcer: Secondary | ICD-10-CM | POA: Insufficient documentation

## 2020-06-30 DIAGNOSIS — I10 Essential (primary) hypertension: Secondary | ICD-10-CM | POA: Insufficient documentation

## 2020-06-30 DIAGNOSIS — L98412 Non-pressure chronic ulcer of buttock with fat layer exposed: Secondary | ICD-10-CM | POA: Insufficient documentation

## 2020-07-01 NOTE — Progress Notes (Signed)
Lindsey Pollard, Lindsey Pollard ([id number]) Visit Report for 06/30/2020 Allergy List Details Patient Name: Lindsey Pollard, Lindsey Pollard. Date of Service: 06/30/2020 1:00 PM Medical Record Number: [id number] Patient Account Number: [id number] Date of Birth/Sex: 08-28-1940 (80 y.o. F) Treating RN: Huel Coventry Primary Care Lacreasha Hinds: Einar Crow Other Clinician: Referring Debroh Sieloff: Einar Crow Treating Annsley Akkerman/Extender: Rowan Blase in Treatment: 0 Allergies Active Allergies penicillin Type: Food diphtheria, pertussis, tetanus vaccine Type: Food Allergy Notes Electronic Signature(s) Signed: 07/01/2020 5:26:41 PM By: Elliot Gurney, BSN, RN, CWS, Kim RN, BSN Entered By: Elliot Gurney, BSN, RN, CWS, Kim on 06/30/2020 13:43:56 Lindsey Pollard ([id number]) -------------------------------------------------------------------------------- Arrival Information Details Patient Name: Lindsey Pollard Date of Service: 06/30/2020 1:00 PM Medical Record Number: [id number] Patient Account Number: [id number] Date of Birth/Sex: 12-18-1940 (80 y.o. F) Treating RN: Huel Coventry Primary Care Zamir Staples: Einar Crow Other Clinician: Referring Usama Harkless: Einar Crow Treating Demaurion Dicioccio/Extender: Rowan Blase in Treatment: 0 Visit Information Patient Arrived: Ambulatory Arrival Time: 13:17 Accompanied By: self Transfer Assistance: None Patient Identification Verified: Yes Secondary Verification Process Completed: Yes History Since Last Visit Added or deleted any medications: No Any new allergies or adverse reactions: No Had a fall or experienced change in activities of daily living that may affect risk of falls: No Signs or symptoms of abuse/neglect since last visito No Hospitalized since last visit: No Implantable device outside of the clinic excluding cellular tissue based products placed in the center since last visit: No Electronic Signature(s) Signed: 06/30/2020 4:51:21 PM By: Dayton Martes RCP, RRT,  CHT Entered By: Dayton Martes on 06/30/2020 13:18:13 Lindsey Pollard ([id number]) -------------------------------------------------------------------------------- Clinic Level of Care Assessment Details Patient Name: Lindsey Pollard Date of Service: 06/30/2020 1:00 PM Medical Record Number: [id number] Patient Account Number: [id number] Date of Birth/Sex: 1941-03-08 (80 y.o. F) Treating RN: Huel Coventry Primary Care Elizabeht Suto: Einar Crow Other Clinician: Referring Najeh Credit: Einar Crow Treating Jandy Brackens/Extender: Rowan Blase in Treatment: 0 Clinic Level of Care Assessment Items TOOL 4 Quantity Score []  - Use when only an EandM is performed on FOLLOW-UP visit 0 ASSESSMENTS - Nursing Assessment / Reassessment X - Reassessment of Co-morbidities (includes updates in patient status) 1 10 X- 1 5 Reassessment of Adherence to Treatment Plan ASSESSMENTS - Wound and Skin Assessment / Reassessment []  - Simple Wound Assessment / Reassessment - one wound 0 X- 1 5 Complex Wound Assessment / Reassessment - multiple wounds []  - 0 Dermatologic / Skin Assessment (not related to wound area) ASSESSMENTS - Focused Assessment []  - Circumferential Edema Measurements - multi extremities 0 []  - 0 Nutritional Assessment / Counseling / Intervention []  - 0 Lower Extremity Assessment (monofilament, tuning fork, pulses) []  - 0 Peripheral Arterial Disease Assessment (using hand held doppler) ASSESSMENTS - Ostomy and/or Continence Assessment and Care []  - Incontinence Assessment and Management 0 []  - 0 Ostomy Care Assessment and Management (repouching, etc.) PROCESS - Coordination of Care X - Simple Patient / Family Education for ongoing care 1 15 []  - 0 Complex (extensive) Patient / Family Education for ongoing care X- 1 10 Staff obtains , Records, Test Results / Process Orders []  - 0 Staff telephones HHA, Nursing Homes / Clarify orders / etc []  - 0 Routine  Transfer to another Facility (non-emergent condition) []  - 0 Routine Hospital Admission (non-emergent condition) X- 1 15 New Admissions / / Ordering NPWT, Apligraf, etc. []  - 0 Emergency Hospital Admission (emergent condition) X- 1 10 Simple Discharge Coordination []  - 0 Complex (extensive) Discharge Coordination PROCESS - Special Needs []  -  Pediatric / Minor Patient Management 0 []  - 0 Isolation Patient Management []  - 0 Hearing / Language / Visual special needs []  - 0 Assessment of Community assistance (transportation, D/C planning, etc.) []  - 0 Additional assistance / Altered mentation []  - 0 Support Surface(s) Assessment (bed, cushion, seat, etc.) INTERVENTIONS - Wound Cleansing / Measurement Lindsey Pollard, Lindsey Pollard. ([id number]) []  - 0 Simple Wound Cleansing - one wound X- 2 5 Complex Wound Cleansing - multiple wounds X- 1 5 Wound Imaging (photographs - any number of wounds) []  - 0 Wound Tracing (instead of photographs) []  - 0 Simple Wound Measurement - one wound X- 2 5 Complex Wound Measurement - multiple wounds INTERVENTIONS - Wound Dressings []  - Small Wound Dressing one or multiple wounds 0 X- 2 15 Medium Wound Dressing one or multiple wounds []  - 0 Large Wound Dressing one or multiple wounds []  - 0 Application of Medications - topical []  - 0 Application of Medications - injection INTERVENTIONS - Miscellaneous []  - External ear exam 0 []  - 0 Specimen Collection (cultures, biopsies, blood, body fluids, etc.) []  - 0 Specimen(s) / Culture(s) sent or taken to Lab for analysis []  - 0 Patient Transfer (multiple staff / Civil Service fast streamer / Similar devices) []  - 0 Simple Staple / Suture removal (25 or less) []  - 0 Complex Staple / Suture removal (26 or more) []  - 0 Hypo / Hyperglycemic Management (close monitor of Blood Glucose) []  - 0 Ankle / Brachial Index (ABI) - do not check if billed separately X- 1 5 Vital Signs Has the patient been seen  at the hospital within the last three years: Yes Total Score: 130 Level Of Care: New/Established - Level 4 Electronic Signature(s) Signed: 07/01/2020 5:26:41 PM By: Gretta Cool, BSN, RN, CWS, Kim RN, BSN Entered By: Gretta Cool, BSN, RN, CWS, Kim on 06/30/2020 17:34:55 Lindsey Pollard ([id number]) -------------------------------------------------------------------------------- Encounter Discharge Information Details Patient Name: Lindsey Pollard Date of Service: 06/30/2020 1:00 PM Medical Record Number: [id number] Patient Account Number: [id number] Date of Birth/Sex: 1940/08/21 (80 y.o. F) Treating RN: [name] Primary Care Eiko Mcgowen: [name] Other Clinician: Referring Fredi Hurtado: [name] Treating Maisie Hauser/Extender: Skipper Cliche in Treatment: 0 Encounter Discharge Information Items Discharge Condition: Stable Ambulatory Status: Ambulatory Discharge Destination: Home Transportation: Private Auto Accompanied By: self Schedule Follow-up Appointment: Yes Clinical Summary of Care: Electronic Signature(s) Signed: 06/30/2020 5:37:04 PM By: Gretta Cool, BSN, RN, CWS, Kim RN, BSN Entered By: Gretta Cool, BSN, RN, CWS, Kim on 06/30/2020 17:37:04 Lindsey Pollard ([id number]) -------------------------------------------------------------------------------- Lower Extremity Assessment Details Patient Name: Lindsey Pollard Date of Service: 06/30/2020 1:00 PM Medical Record Number: [id number] Patient Account Number: [id number] Date of Birth/Sex: November 29, 1940 (80 y.o. F) Treating RN: [name] Primary Care Lucretia Pendley: [name] Other Clinician: Referring Aubryana Vittorio: [name] Treating Jamaul Heist/Extender: Skipper Cliche in Treatment: 0 Electronic Signature(s) Signed: 07/01/2020 5:26:41 PM By: Gretta Cool, BSN, RN, CWS, Kim RN, BSN Entered By: Gretta Cool, BSN, RN, CWS, Kim on 06/30/2020 13:47:30 Lindsey Pollard  ([id number]) -------------------------------------------------------------------------------- Multi Wound Chart Details Patient Name: Lindsey Pollard Date of Service: 06/30/2020 1:00 PM Medical Record Number: [id number] Patient Account Number: [id number] Date of Birth/Sex: Feb 18, 1941 (80 y.o. F) Treating RN: [name] Primary Care Azarias Chiou: [name] Other Clinician: Referring Jerricka Carvey: [name] Treating Sofiah Lyne/Extender: Skipper Cliche in Treatment: 0 Vital Signs Height(in): 59 Pulse(bpm): 69 Weight(lbs): 153 Blood Pressure(mmHg): 165/66 Body Mass Index(BMI): 31 Temperature(F): 97.8 Respiratory Rate(breaths/min): 16 Photos: [N/A:N/A] Wound Location: Right Gluteal fold Left Labia N/A Wounding Event: Gradually Appeared Gradually  Appeared N/A Primary Etiology: To be determined To be determined N/A Comorbid History: Cataracts, Hypertension, Myocardial Cataracts, Hypertension, Myocardial N/A Infarction, Type II Diabetes, History Infarction, Type II Diabetes, History of pressure wounds, Osteoarthritis of pressure wounds, Osteoarthritis Date Acquired: 05/30/2020 05/30/2020 N/A Weeks of Treatment: 0 0 N/A Wound Status: Open Open N/A Measurements L x W x D (cm) 1x1.5x0.2 1.5x1.2x0.2 N/A Area (cm) : 1.178 1.414 N/A Volume (cm) : 0.236 0.283 N/A % Reduction in Area: 0.00% N/A N/A % Reduction in Volume: 0.00% N/A N/A Classification: Full Thickness Without Exposed Full Thickness Without Exposed N/A Support Structures Support Structures Exudate Amount: Medium Medium N/A Exudate Type: Serous Serous N/A Exudate Color: amber amber N/A Wound Margin: Flat and Intact Flat and Intact N/A Granulation Amount: Medium (34-66%) Medium (34-66%) N/A Granulation Quality: Pink N/A N/A Necrotic Amount: Medium (34-66%) Medium (34-66%) N/A Exposed Structures: Fat Layer (Subcutaneous Tissue): Fat Layer (Subcutaneous Tissue): N/A Yes Yes Fascia: No Fascia: No Tendon: No Tendon:  No Muscle: No Muscle: No Joint: No Joint: No Bone: No Bone: No Epithelialization: None None N/A Treatment Notes Electronic Signature(s) Signed: 06/30/2020 5:31:31 PM By: Elliot Gurney, BSN, RN, CWS, Kim RN, BSN Entered By: Elliot Gurney, BSN, RN, CWS, Kim on 06/30/2020 17:31:31 Lindsey Pollard ([id number]) -------------------------------------------------------------------------------- Multi-Disciplinary Care Plan Details Patient Name: Lindsey Pollard Date of Service: 06/30/2020 1:00 PM Medical Record Number: [id number] Patient Account Number: [id number] Date of Birth/Sex: [birth year] (80 y.o. F) Treating RN: Huel Coventry Primary Care Demari Kropp: Einar Crow Other Clinician: Referring Ellagrace Yoshida: Einar Crow Treating Margretta Zamorano/Extender: Rowan Blase in Treatment: 0 Active Inactive Orientation to the Wound Care Program Nursing Diagnoses: Knowledge deficit related to the wound healing center program Goals: Patient/caregiver will verbalize understanding of the Wound Healing Center Program Date Initiated: 06/30/2020 Target Resolution Date: 06/30/2020 Goal Status: Active Interventions: Provide education on orientation to the wound center Notes: Wound/Skin Impairment Nursing Diagnoses: Impaired tissue integrity Goals: Patient/caregiver will verbalize understanding of skin care regimen Date Initiated: 06/30/2020 Target Resolution Date: 06/30/2020 Goal Status: Active Ulcer/skin breakdown will have a volume reduction of 30% by week 4 Date Initiated: 06/30/2020 Target Resolution Date: 07/29/2020 Goal Status: Active Ulcer/skin breakdown will have a volume reduction of 50% by week 8 Date Initiated: 06/30/2020 Target Resolution Date: 08/26/2020 Goal Status: Active Ulcer/skin breakdown will have a volume reduction of 80% by week 12 Date Initiated: 06/30/2020 Target Resolution Date: 09/26/2020 Goal Status: Active Ulcer/skin breakdown will heal within 14 weeks Date Initiated: 06/30/2020 Target Resolution  Date: 10/10/2020 Goal Status: Active Interventions: Assess ulceration(s) every visit Treatment Activities: Skin care regimen initiated : 06/30/2020 Notes: Electronic Signature(s) Signed: 06/30/2020 5:31:04 PM By: Elliot Gurney, BSN, RN, CWS, Kim RN, BSN Entered By: Elliot Gurney, BSN, RN, CWS, Kim on 06/30/2020 17:31:03 Lindsey Pollard ([id number]) -------------------------------------------------------------------------------- Pain Assessment Details Patient Name: Lindsey Pollard Date of Service: 06/30/2020 1:00 PM Medical Record Number: [id number] Patient Account Number: [id number] Date of Birth/Sex: 1940/10/02 (80 y.o. F) Treating RN: Huel Coventry Primary Care Teralyn Mullins: Einar Crow Other Clinician: Referring Danicia Terhaar: Einar Crow Treating Julie Paolini/Extender: Rowan Blase in Treatment: 0 Active Problems Location of Pain Severity and Description of Pain Patient Has Paino No Site Locations Pain Management and Medication Current Pain Management: Electronic Signature(s) Signed: 06/30/2020 4:51:21 PM By: Dayton Martes RCP, RRT, CHT Signed: 07/01/2020 5:26:41 PM By: Elliot Gurney, BSN, RN, CWS, Kim RN, BSN Entered By: Dayton Martes on 06/30/2020 13:18:22 Lindsey Pollard ([id number]) -------------------------------------------------------------------------------- Patient/Caregiver Education Details Patient Name: Lindsey Pollard Date of Service: 06/30/2020 1:00 PM Medical  Record Number: [id number] Patient Account Number: [id number] Date of Birth/Gender: 05/02/1941 (80 y.o. F) Treating RN: Huel Coventry Primary Care Physician: Einar Crow Other Clinician: Referring Physician: Einar Crow Treating Physician/Extender: Rowan Blase in Treatment: 0 Education Assessment Education Provided To: Patient Education Topics Provided Wound/Skin Impairment: Handouts: Caring for Your Ulcer, Other: wound care as prescribed Methods: Demonstration,  Explain/Verbal Responses: State content correctly Electronic Signature(s) Signed: 07/01/2020 5:26:41 PM By: Elliot Gurney, BSN, RN, CWS, Kim RN, BSN Entered By: Elliot Gurney, BSN, RN, CWS, Kim on 06/30/2020 17:35:50 Lindsey Pollard ([id number]) -------------------------------------------------------------------------------- Wound Assessment Details Patient Name: Lindsey Pollard Date of Service: 06/30/2020 1:00 PM Medical Record Number: [id number] Patient Account Number: [id number] Date of Birth/Sex: 1940/08/14 (80 y.o. F) Treating RN: Huel Coventry Primary Care Zacaria Pousson: Einar Crow Other Clinician: Referring Kenichi Cassada: Einar Crow Treating Lundy Cozart/Extender: Rowan Blase in Treatment: 0 Wound Status Wound Number: 2 Primary To be determined Etiology: Wound Location: Right Gluteal fold Wound Open Wounding Event: Gradually Appeared Status: Date Acquired: 05/30/2020 Comorbid Cataracts, Hypertension, Myocardial Infarction, Type II Weeks Of Treatment: 0 History: Diabetes, History of pressure wounds, Osteoarthritis Clustered Wound: No Photos Photo Uploaded By: Elliot Gurney, BSN, RN, CWS, Kim on 06/30/2020 13:58:02 Wound Measurements Length: (cm) 1 Width: (cm) 1.5 Depth: (cm) 0.2 Area: (cm) 1.178 Volume: (cm) 0.236 % Reduction in Area: 0% % Reduction in Volume: 0% Epithelialization: None Tunneling: No Undermining: No Wound Description Classification: Full Thickness Without Exposed Support Structu Wound Margin: Flat and Intact Exudate Amount: Medium Exudate Type: Serous Exudate Color: amber res Foul Odor After Cleansing: No Slough/Fibrino Yes Wound Bed Granulation Amount: Medium (34-66%) Exposed Structure Granulation Quality: Pink Fascia Exposed: No Necrotic Amount: Medium (34-66%) Fat Layer (Subcutaneous Tissue) Exposed: Yes Necrotic Quality: Adherent Slough Tendon Exposed: No Muscle Exposed: No Joint Exposed: No Bone Exposed: No Electronic Signature(s) Signed: 07/01/2020  5:26:41 PM By: Elliot Gurney, BSN, RN, CWS, Kim RN, BSN Entered By: Elliot Gurney, BSN, RN, CWS, Kim on 06/30/2020 13:55:30 Lindsey Pollard ([id number]) -------------------------------------------------------------------------------- Wound Assessment Details Patient Name: Lindsey Pollard Date of Service: 06/30/2020 1:00 PM Medical Record Number: [id number] Patient Account Number: [id number] Date of Birth/Sex: 1941/03/06 (80 y.o. F) Treating RN: Huel Coventry Primary Care Zayda Angell: Einar Crow Other Clinician: Referring Mashell Sieben: Einar Crow Treating Taeshaun Rames/Extender: Rowan Blase in Treatment: 0 Wound Status Wound Number: 3 Primary To be determined Etiology: Wound Location: Left Labia Wound Open Wounding Event: Gradually Appeared Status: Date Acquired: 05/30/2020 Comorbid Cataracts, Hypertension, Myocardial Infarction, Type II Weeks Of Treatment: 0 History: Diabetes, History of pressure wounds, Osteoarthritis Clustered Wound: No Photos Photo Uploaded By: Elliot Gurney, BSN, RN, CWS, Kim on 06/30/2020 13:58:04 Wound Measurements Length: (cm) 1.5 Width: (cm) 1.2 Depth: (cm) 0.2 Area: (cm) 1.414 Volume: (cm) 0.283 % Reduction in Area: % Reduction in Volume: Epithelialization: None Tunneling: No Undermining: No Wound Description Classification: Full Thickness Without Exposed Support Structu Wound Margin: Flat and Intact Exudate Amount: Medium Exudate Type: Serous Exudate Color: amber res Foul Odor After Cleansing: No Slough/Fibrino Yes Wound Bed Granulation Amount: Medium (34-66%) Exposed Structure Necrotic Amount: Medium (34-66%) Fascia Exposed: No Necrotic Quality: Adherent Slough Fat Layer (Subcutaneous Tissue) Exposed: Yes Tendon Exposed: No Muscle Exposed: No Joint Exposed: No Bone Exposed: No Treatment Notes Wound #3 (Labia) Wound Laterality: Left Cleanser Peri-Wound Care Topical Stern, Evani M. ([id number]) Primary Dressing Prisma 4.34 (in) Discharge  Instruction: Moisten w/normal saline or sterile water; Cover wound as directed. Do not remove from wound bed. Secondary Dressing Secured With Compression Wrap Compression Stockings Add-Ons Coverlet Latex-Free Fabric Adhesive  Dressings Discharge Instruction: Elastic Knuckle Bandage Electronic Signature(s) Signed: 07/01/2020 5:26:41 PM By: Elliot Gurney, BSN, RN, CWS, Kim RN, BSN Entered By: Elliot Gurney, BSN, RN, CWS, Kim on 06/30/2020 13:56:56 Lindsey Pollard ([id number]) -------------------------------------------------------------------------------- Vitals Details Patient Name: Lindsey Pollard Date of Service: 06/30/2020 1:00 PM Medical Record Number: [id number] Patient Account Number: [id number] Date of Birth/Sex: May 30, 1941 (80 y.o. F) Treating RN: Huel Coventry Primary Care Moua Rasmusson: Einar Crow Other Clinician: Referring Ellijah Leffel: Einar Crow Treating Koran Seabrook/Extender: Rowan Blase in Treatment: 0 Vital Signs Time Taken: 13:18 Temperature (F): 97.8 Height (in): 59 Pulse (bpm): 59 Source: Stated Respiratory Rate (breaths/min): 16 Weight (lbs): 153 Blood Pressure (mmHg): 165/66 Source: Measured Reference Range: 80 - 120 mg / dl Body Mass Index (BMI): 30.9 Electronic Signature(s) Signed: 06/30/2020 4:51:21 PM By: Dayton Martes RCP, RRT, CHT Entered By: Dayton Martes on 06/30/2020 13:21:46

## 2020-07-01 NOTE — Progress Notes (Signed)
RAYOLA, EVERHART ([id number]) Visit Report for 06/30/2020 Chief Complaint Document Details Patient Name: Lindsey Pollard, Lindsey Pollard. Date of Service: 06/30/2020 1:00 PM Medical Record Number: [id number] Patient Account Number: [id number] Date of Birth/Sex: 03-17-1941 (80 y.o. F) Treating RN: [name] Primary Care Provider: [name] Other Clinician: Referring Provider: [name] Treating Provider/Extender: Skipper Cliche in Treatment: 0 Information Obtained from: Patient Chief Complaint Right gluteal fold pressure ulcer Electronic Signature(s) Signed: 06/30/2020 2:10:28 PM By: Worthy Keeler PA-C Entered By: Worthy Keeler on 06/30/2020 14:10:27 Lindsey Pollard ([id number]) -------------------------------------------------------------------------------- HPI Details Patient Name: Lindsey Pollard Date of Service: 06/30/2020 1:00 PM Medical Record Number: [id number] Patient Account Number: [id number] Date of Birth/Sex: May 02, 1941 (80 y.o. F) Treating RN: [name] Primary Care Provider: [name] Other Clinician: Referring Provider: [name] Treating Provider/Extender: Skipper Cliche in Treatment: 0 History of Present Illness HPI Description: 05/06/2020 on evaluation today patient presents for initial inspection here in our clinic concerning an issue that she is having with a wound in the right gluteal fold region. She has been tolerating some dressing changes she has been performing at home mainly she been putting a little bit of ointment on this and keeping it covered. She notes that this has been present for about 2 months and she is noted some improvement compared to where it started but unfortunately not a significant amount to where she is pleased with how things have gone. Nonetheless she did come in to be seen in order to see if there is anything we can do to aid in helping the area to heal more effectively and quickly. Fortunately there is no signs of  active infection at this time. No fevers, chills, nausea, vomiting, or diarrhea. She has diabetic and on 05/04/19 her most recent reading of 7.9. She also has a history of heart disease of note. She also has hypertension. Her blood pressure is elevated today as well. She follows up with her primary care provider on a regular basis in regard to this. 07/17/2019 on evaluation today patient appears to be doing well with regard to her wound at this time. She has been tolerating the dressing changes without complication. Fortunately she is able to do this herself and seems to be doing quite well. There does not appear to be any signs of active infection at this time which is good news. No fevers, chills, nausea, vomiting, or diarrhea. 07/28/2019 on evaluation today patient appears to be doing a little bit worse today compared to previously noted in regard to her wound. Unfortunately she seems to be having slight hypergranulation I am not exactly sure why this is. Fortunately there is no signs of active infection at this time. No fevers, chills, nausea, vomiting, or diarrhea. With that being said it may be that this is more of a moisture issue this is somewhat in a crease in the gluteal fold region unfortunately that is making things a little bit more severe in my opinion. 08/04/2019 upon evaluation today patient appears to be doing excellent in regard to her wound. In fact this is almost completely healed and seems to have made great progress since last week. Fortunately there is no signs of active infection at this time. 08/11/2019 upon evaluation today patient actually appears to be completely healed she is doing excellent at this point. Fortunately there is no signs of active infection which is also great news. I am very pleased with how things are progressing. 06/30/2020 upon evalu somewhat poorly in  regard to the 2 wounds that apparently appeared about a month ago. These appear to likely be abscesses in nature  although to be honest I cannot be for sure where this came from. It definitely does not appear to be pressure to me and other than abscess I really do not see an obvious other etiology at this point. She does have a rash in the inguinal area I think this is probably fungal in nature and I think some nystatin powder could be beneficial for with that being said I do not think that this is the cause of the wounds themselves. Overall today patient appears to be doing Quite well. Electronic Signature(s) Signed: 06/30/2020 3:27:55 PM By: Lenda Kelp PA-C Entered By: Lenda Kelp on 06/30/2020 15:27:53 Lindsey Pollard ([id number]) -------------------------------------------------------------------------------- Physical Exam Details Patient Name: Lindsey Pollard Date of Service: 06/30/2020 1:00 PM Medical Record Number: [id number] Patient Account Number: [id number] Date of Birth/Sex: [birth year] (80 y.o. F) Treating RN: Huel Coventry Primary Care Provider: Einar Crow Other Clinician: Referring Provider: Einar Crow Treating Provider/Extender: Rowan Blase in Treatment: 0 Constitutional patient is hypertensive.. pulse regular and within target range for patient.Marland Kitchen respirations regular, non-labored and within target range for patient.Marland Kitchen temperature within target range for patient.. Well-nourished and well-hydrated in no acute distress. Eyes conjunctiva clear no eyelid edema noted. pupils equal round and reactive to light and accommodation. Ears, Nose, Mouth, and Throat no gross abnormality of ear auricles or external auditory canals. normal hearing noted during conversation. mucus membranes moist. Respiratory normal breathing without difficulty. Musculoskeletal normal gait and posture. no significant deformity or arthritic changes, no loss or range of motion, no clubbing. Psychiatric this patient is able to make decisions and demonstrates good insight into disease process. Alert  and Oriented x 3. pleasant and cooperative. Notes Patient's wounds currently showed signs of fairly good granulation which is at least good news. With that being said she unfortunately is having issues with wounds over the labia area as well as the gluteal region and this is somewhat unusual as she does not sit in extravagant amount of time she ambulate well and to be honest she is not really sure how these came out. Electronic Signature(s) Signed: 06/30/2020 3:29:41 PM By: Lenda Kelp PA-C Entered By: Lenda Kelp on 06/30/2020 15:29:40 Lindsey Pollard ([id number]) -------------------------------------------------------------------------------- Physician Orders Details Patient Name: Lindsey Pollard Date of Service: 06/30/2020 1:00 PM Medical Record Number: [id number] Patient Account Number: [id number] Date of Birth/Sex: 07-Dec-1940 (79 y.o. 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 Off-Loading Wound #2 Right Gluteal fold o Turn and reposition every 2 hours o Other: - Keep pressure off of wounded areas. Wound #3 Left Labia o Turn and reposition every 2 hours o Other: - Keep pressure off of wounded areas. Wound Treatment Wound #2 - Gluteal fold Wound Laterality: Right Primary Dressing: Prisma 4.34 (in) 3 x Per Week/15 Days Discharge Instructions: Moisten w/normal saline or  sterile water; Cover wound as directed. Do not remove from wound  bed. Add-Ons: Coverlet Latex-Free Fabric Adhesive Dressings 3 x Per Week/15 Days Discharge Instructions: Elastic Knuckle Bandage Wound #3 - Labia Wound Laterality: Left Primary Dressing: Prisma 4.34 (in) 3 x Per Week/15 Days Discharge Instructions: Moisten w/normal saline or sterile water; Cover wound as directed. Do not remove from wound bed. Add-Ons: Coverlet Latex-Free Fabric Adhesive Dressings 3 x Per Week/15 Days Discharge Instructions: Elastic Knuckle Bandage Patient Medications Allergies: penicillin, diphtheria, pertussis, tetanus vaccine Notifications Medication Indication Start End nystatin 07/01/2020 DOSE topical 100,000 unit/gram powder - powder topical apply in the groin region in the area of rash 2 times per day x 1 month or until healed Electronic Signature(s) Signed: 07/01/2020 9:24:01 AM By: Lenda KelpStone III, Hoyt PA-C Previous Signature: 06/30/2020 5:34:06 PM Version By: Elliot GurneyWoody, BSN, RN, CWS, Kim RN, BSN Entered By: Lenda KelpStone III, Hoyt on 07/01/2020 09:24:00 Lindsey Pollard, Lindsey M. ([id number] Pollard) Civil, Kynzley M. ([id number]) -------------------------------------------------------------------------------- Problem List Details Patient Name: Lindsey Pollard, Lindsey M. Date of Service: 06/30/2020 1:00 PM Medical Record Number: [id number] Patient Account Number: [id number] Date of Birth/Sex: 1940/08/01 (79 y.o. F) Treating RN: Huel CoventryWoody, Kim Primary Care Provider: Einar CrowAnderson, Marshall Other Clinician: Referring Provider: Einar CrowAnderson, Marshall Treating Provider/Extender: Allen DerryStone, Hoyt Weeks in Treatment: 0 Active Problems ICD-10 Encounter Code Description Active Date MDM Diagnosis [phone] Non-pressure chronic ulcer of buttock with fat layer exposed 06/30/2020 No Yes L98.492 Non-pressure chronic ulcer of skin of other sites with fat layer exposed 06/30/2020 No Yes E11.622 Type 2 diabetes mellitus with other skin ulcer 06/30/2020 No Yes I10 Essential (primary)  hypertension 06/30/2020 No Yes I25.10 Atherosclerotic heart disease of native coronary artery without angina 06/30/2020 No Yes pectoris Inactive Problems Resolved Problems Electronic Signature(s) Signed: 06/30/2020 2:10:12 PM By: Lenda KelpStone III, Hoyt PA-C Entered By: Lenda KelpStone III, Hoyt on 06/30/2020 14:10:11 Lindsey Pollard, Lindsey M. ([id number]) -------------------------------------------------------------------------------- Progress Note Details Patient Name: Lindsey Pollard, Trinitey M. Date of Service: 06/30/2020 1:00 PM Medical Record Number: [id number] Patient Account Number: [id number] Date of Birth/Sex: 1940/08/01 (79 y.o. F) Treating RN: Huel CoventryWoody, Kim Primary Care Provider: Einar CrowAnderson, Marshall Other Clinician: Referring Provider: Einar CrowAnderson, Marshall Treating Provider/Extender: Rowan BlaseStone, Hoyt Weeks in Treatment: 0 Subjective Chief Complaint Information obtained from Patient Right gluteal fold pressure ulcer History of Present Illness (HPI) 05/06/2020 on evaluation today patient presents for initial inspection here in our clinic concerning an issue that she is having with a wound in the right gluteal fold region. She has been tolerating some dressing changes she has been performing at home mainly she been putting a little bit of ointment on this and keeping it covered. She notes that this has been present for about 2 months and she is noted some improvement compared to where it started but unfortunately not a significant amount to where she is pleased with how things have gone. Nonetheless she did come in to be seen in order to see if there is anything we can do to aid in helping the area to heal more effectively and quickly. Fortunately there is no signs of active infection at this time. No fevers, chills, nausea, vomiting, or diarrhea. She has diabetic and on 05/04/19 her most recent reading of 7.9. She also has a history of heart disease of note. She also has hypertension. Her blood pressure is elevated today as well. She  follows up with her primary care provider on a regular basis in regard to this. 07/17/2019 on evaluation today patient appears to be doing well with regard to her wound at this time. She has been tolerating the dressing changes without complication. Fortunately she is  able to do this herself and seems to be doing quite well. There does not appear to be any signs of active infection at this time which is good news. No fevers, chills, nausea, vomiting, or diarrhea. 07/28/2019 on evaluation today patient appears to be doing a little bit worse today compared to previously noted in regard to her wound. Unfortunately she seems to be having slight hypergranulation I am not exactly sure why this is. Fortunately there is no signs of active infection at this time. No fevers, chills, nausea, vomiting, or diarrhea. With that being said it may be that this is more of a moisture issue this is somewhat in a crease in the gluteal fold region unfortunately that is making things a little bit more severe in my opinion. 08/04/2019 upon evaluation today patient appears to be doing excellent in regard to her wound. In fact this is almost completely healed and seems to have made great progress since last week. Fortunately there is no signs of active infection at this time. 08/11/2019 upon evaluation today patient actually appears to be completely healed she is doing excellent at this point. Fortunately there is no signs of active infection which is also great news. I am very pleased with how things are progressing. 06/30/2020 upon evalu somewhat poorly in regard to the 2 wounds that apparently appeared about a month ago. These appear to likely be abscesses in nature although to be honest I cannot be for sure where this came from. It definitely does not appear to be pressure to me and other than abscess I really do not see an obvious other etiology at this point. She does have a rash in the inguinal area I think this is probably  fungal in nature and I think some nystatin powder could be beneficial for with that being said I do not think that this is the cause of the wounds themselves. Overall today patient appears to be doing Quite well. Patient History Information obtained from Patient. Allergies penicillin, diphtheria, pertussis, tetanus vaccine Family History Diabetes - Father, Heart Disease - Father,Mother, Hypertension - Father,Mother, Stroke - Father,Siblings, Thyroid Problems - Mother, No family history of Cancer, Hereditary Spherocytosis, Kidney Disease, Lung Disease, Seizures, Tuberculosis. Social History Former smoker - 20 years, Marital Status - Divorced, Alcohol Use - Never, Drug Use - No History, Caffeine Use - Never. Medical History Eyes Patient has history of Cataracts - removed Denies history of Glaucoma, Optic Neuritis Cardiovascular Patient has history of Hypertension, Myocardial Infarction Denies history of Angina, Arrhythmia, Congestive Heart Failure, Coronary Artery Disease, Deep Vein Thrombosis, Hypotension, Peripheral Arterial Disease, Peripheral Venous Disease, Phlebitis, Vasculitis Endocrine Patient has history of Type II Diabetes Denies history of Type I Diabetes Integumentary (Skin) Patient has history of History of pressure wounds Denies history of History of Burn Musculoskeletal Lindsey Pollard, Lindsey Pollard ([id number]) Patient has history of Osteoarthritis Denies history of Gout, Rheumatoid Arthritis, Osteomyelitis Review of Systems (ROS) Integumentary (Skin) Complains or has symptoms of Wounds. Objective Constitutional patient is hypertensive.. pulse regular and within target range for patient.Marland Kitchen respirations regular, non-labored and within target range for patient.Marland Kitchen temperature within target range for patient.. Well-nourished and well-hydrated in no acute distress. Vitals Time Taken: 1:18 PM, Height: 59 in, Source: Stated, Weight: 153 lbs, Source: Measured, BMI: 30.9, Temperature: 97.8  F, Pulse: 59 bpm, Respiratory Rate: 16 breaths/min, Blood Pressure: 165/66 mmHg. Eyes conjunctiva clear no eyelid edema noted. pupils equal round and reactive to light and accommodation. Ears, Nose, Mouth, and Throat no gross abnormality  of ear auricles or external auditory canals. normal hearing noted during conversation. mucus membranes moist. Respiratory normal breathing without difficulty. Musculoskeletal normal gait and posture. no significant deformity or arthritic changes, no loss or range of motion, no clubbing. Psychiatric this patient is able to make decisions and demonstrates good insight into disease process. Alert and Oriented x 3. pleasant and cooperative. General Notes: Patient's wounds currently showed signs of fairly good granulation which is at least good news. With that being said she unfortunately is having issues with wounds over the labia area as well as the gluteal region and this is somewhat unusual as she does not sit in extravagant amount of time she ambulate well and to be honest she is not really sure how these came out. Integumentary (Hair, Skin) Wound #2 status is Open. Original cause of wound was Gradually Appeared. The wound is located on the Right Gluteal fold. The wound measures 1cm length x 1.5cm width x 0.2cm depth; 1.178cm^2 area and 0.236cm^3 volume. There is Fat Layer (Subcutaneous Tissue) exposed. There is no tunneling or undermining noted. There is a medium amount of serous drainage noted. The wound margin is flat and intact. There is medium (34-66%) pink granulation within the wound bed. There is a medium (34-66%) amount of necrotic tissue within the wound bed including Adherent Slough. Wound #3 status is Open. Original cause of wound was Gradually Appeared. The wound is located on the Left Labia. The wound measures 1.5cm length x 1.2cm width x 0.2cm depth; 1.414cm^2 area and 0.283cm^3 volume. There is Fat Layer (Subcutaneous Tissue) exposed. There is  no tunneling or undermining noted. There is a medium amount of serous drainage noted. The wound margin is flat and intact. There is medium (34- 66%) granulation within the wound bed. There is a medium (34-66%) amount of necrotic tissue within the wound bed including Adherent Slough. 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 ([id number]) Plan Follow-up Appointments: Return Appointment in 1 week. Off-Loading: Wound #2 Right Gluteal fold: Turn and reposition every 2 hours Other: - Keep pressure off of wounded areas. Wound #3 Left Labia: Turn and reposition every 2 hours Other: - Keep pressure off of wounded areas. The following medication(s) was prescribed: nystatin topical 100,000 unit/gram powder powder topical apply in the groin region in the area of rash 2 times per day x 1 month or until healed starting 07/01/2020 WOUND #2: - Gluteal fold Wound Laterality: Right Primary Dressing: Prisma 4.34 (in) 3 x Per Week/15 Days Discharge Instructions: Moisten w/normal saline or sterile water; Cover wound as directed. Do not remove from wound bed. Add-Ons: Coverlet Latex-Free Fabric Adhesive Dressings 3 x Per Week/15 Days Discharge Instructions: Elastic Knuckle Bandage WOUND #3: - Labia Wound Laterality: Left Primary Dressing: Prisma 4.34 (in) 3 x Per Week/15 Days Discharge Instructions: Moisten w/normal saline or sterile water; Cover wound as directed. Do not remove from wound bed. Add-Ons: Coverlet Latex-Free Fabric Adhesive Dressings 3 x Per Week/15 Days Discharge Instructions: Elastic Knuckle Bandage 1. Would recommend currently that we go ahead and initiate treatment with collagen for the  wounds I think this will probably be the best way to go. I think she can put this on considering she has been able to put a Band-Aid on I think  you are probably be one of the better things to use. She does tell me that she was even able to get a Band-Aid on the labia region. 2. I am also can recommend that the patient continue to as much as possible keep pressure off the area obvious at all think pressure is the main issue but nonetheless that can still aid in helping it to heal. 3. I would also recommend we continue to monitor for infection I do not see any evidence of that right now but again that could change. 4. I am also can recommend nystatin powder to the groin area I will send this into the pharmacy for her as well. She is in agreement with that plan. We will see patient back for reevaluation in 1 week here in the clinic. If anything worsens or changes patient will contact our office for additional recommendations. Electronic Signature(s) Signed: 07/01/2020 9:24:26 AM By: Lenda Kelp PA-C Previous Signature: 06/30/2020 3:30:30 PM Version By: Lenda Kelp PA-C Entered By: Lenda Kelp on 07/01/2020 09:24:26 Lindsey Pollard ([id number]) -------------------------------------------------------------------------------- ROS/PFSH Details Patient Name: Lindsey Pollard Date of Service: 06/30/2020 1:00 PM Medical Record Number: [id number] Patient Account Number: [id number] Date of Birth/Sex: 06/07/1941 (79 y.o. F) Treating RN: Huel Coventry Primary Care Provider: Einar Crow Other Clinician: Referring Provider: Einar Crow Treating Provider/Extender: Rowan Blase in Treatment: 0 Information Obtained From Patient Integumentary (Skin) Complaints and Symptoms: Positive for: Wounds Medical History: Positive for: History of pressure wounds Negative for: History of Burn Eyes Medical History: Positive for: Cataracts - removed Negative for: Glaucoma; Optic  Neuritis Cardiovascular Medical History: Positive for: Hypertension; Myocardial Infarction Negative for: Angina; Arrhythmia; Congestive Heart Failure; Coronary Artery Disease; Deep Vein Thrombosis; Hypotension; Peripheral Arterial Disease; Peripheral Venous Disease; Phlebitis; Vasculitis Endocrine Medical History: Positive for: Type II Diabetes Negative for: Type I Diabetes Time with diabetes: 10 Treated with: Insulin Musculoskeletal Medical History: Positive for: Osteoarthritis Negative for: Gout; Rheumatoid Arthritis; Osteomyelitis HBO Extended History Items Eyes: Cataracts Immunizations Pneumococcal Vaccine: Received Pneumococcal Vaccination: Yes Implantable Devices None Family and Social History Cancer: No; Diabetes: Yes - Father; Heart Disease: Yes - Father,Mother; Hereditary Spherocytosis: No; Hypertension: Yes - Father,Mother; Kidney Disease: No; Lung Disease: No; Seizures: No; Stroke: Yes - Father,Siblings; Thyroid Problems: Yes - Mother; Tuberculosis: No; Former smoker - 20 years; Marital Status - Divorced; Alcohol Use: Never; Drug Use: No History; Caffeine Use: Never; Financial Concerns: No; Food, Clothing or Shelter Needs: No; Support System Lacking: No; Transportation Concerns: No NISHA, DHAMI ([id number]) Electronic Signature(s) Signed: 07/01/2020 4:17:09 PM By: Lenda Kelp PA-C Signed: 07/01/2020 5:26:41 PM By: Elliot Gurney, BSN, RN, CWS, Kim RN, BSN Entered By: Elliot Gurney, BSN, RN, CWS, Kim on 06/30/2020 13:45:02 Lindsey Pollard ([id number]) -------------------------------------------------------------------------------- SuperBill Details Patient Name: Lindsey Pollard Date of Service: 06/30/2020 Medical Record Number: [id number] Patient Account Number: [id number] Date of Birth/Sex: Nov 19, 1940 (79 y.o. 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 E11.622 Type 2 diabetes mellitus with other skin ulcer I10 Essential (primary) hypertension I25.10 Atherosclerotic heart disease of native coronary artery without angina pectoris Physician Procedures CPT4 Code: [id number] Description: 99214 - WC PHYS LEVEL 4 - EST PT Modifier: Quantity: 1 CPT4 Code: Description: ICD-10 Diagnosis Description L98.412  Non-pressure chronic ulcer of buttock with fat layer exposed 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

## 2020-07-01 NOTE — Progress Notes (Signed)
OLLA, DELANCEY ([id number]) Visit Report for 06/30/2020 Abuse/Suicide Risk Screen Details [name]: Lindsey Pollard, Lindsey Pollard Date of Service: 06/30/2020 1:00 PM Medical Record Number: [id number] Patient Account Number: [id number] Date of Birth/Sex: 03/05/1941 (79 y.o. F) Treating RN: Huel Coventry Primary Care Emmylou Bieker: Einar Crow Other Clinician: Referring Neymar Dowe: Einar Crow Treating Elwanda Moger/Extender: Rowan Blase in Treatment: 0 Abuse/Suicide Risk Screen Items Answer ABUSE RISK SCREEN: Has anyone close to you tried to hurt or harm you recentlyo No Do you feel uncomfortable with anyone in your familyo No Has anyone forced you do things that you didnot want to doo No Electronic Signature(s) Signed: 07/01/2020 5:26:41 PM By: Elliot Gurney, BSN, RN, CWS, Kim RN, BSN Entered By: Elliot Gurney, BSN, RN, CWS, Kim on 06/30/2020 13:45:30 Lindsey Pollard ([id number]) -------------------------------------------------------------------------------- Activities of Daily Living Details [name]: Lindsey Pollard Date of Service: 06/30/2020 1:00 PM Medical Record Number: [id number] Patient Account Number: [id number] Date of Birth/Sex: 11-Mar-1941 (79 y.o. F) Treating RN: Huel Coventry Primary Care Gabbriella Presswood: Einar Crow Other Clinician: Referring Heyward Douthit: Einar Crow Treating Doris Gruhn/Extender: Rowan Blase in Treatment: 0 Activities of Daily Living Items Answer Activities of Daily Living (Please select one for each item) Drive Automobile Not Able Take Medications Completely Able Use Telephone Completely Able Care for Appearance Completely Able Use Toilet Completely Able Bath / Shower Completely Able Dress Self Completely Able Feed Self Completely Able Walk Completely Able Get In / Out Bed Completely Able Housework Completely Able Prepare Meals Completely Able Handle Money Completely Able Shop for Self Completely Able Electronic Signature(s) Signed: 07/01/2020 5:26:41 PM By:  Elliot Gurney, BSN, RN, CWS, Kim RN, BSN Entered By: Elliot Gurney, BSN, RN, CWS, Kim on 06/30/2020 13:45:54 Lindsey Pollard ([id number]) -------------------------------------------------------------------------------- Education Screening Details [name]: Lindsey Pollard Date of Service: 06/30/2020 1:00 PM Medical Record Number: [id number] Patient Account Number: [id number] Date of Birth/Sex: 1940-06-26 (79 y.o. F) Treating RN: Huel Coventry Primary Care Aubrey Blackard: Einar Crow Other Clinician: Referring Calem Cocozza: Einar Crow Treating Jaquita Bessire/Extender: Rowan Blase in Treatment: 0 Learning Preferences/Education Level/Primary Language Learning Preference: Explanation, Demonstration Highest Education Level: High School Preferred Language: English Cognitive Barrier Language Barrier: No Translator Needed: No Memory Deficit: No Emotional Barrier: No Physical Barrier Impaired Vision: No Impaired Hearing: Yes Decreased Hand dexterity: No Knowledge/Comprehension Knowledge Level: Medium Comprehension Level: Medium Ability to understand written instructions: Medium Ability to understand verbal instructions: Medium Motivation Anxiety Level: Calm Cooperation: Cooperative Education Importance: Acknowledges Need Interest in Health Problems: Asks Questions Perception: Coherent Willingness to Engage in Self-Management High Activities: Readiness to Engage in Self-Management High Activities: Psychologist, prison and probation services) Signed: 07/01/2020 5:26:41 PM By: Elliot Gurney, BSN, RN, CWS, Kim RN, BSN Entered By: Elliot Gurney, BSN, RN, CWS, Kim on 06/30/2020 13:46:30 Lindsey Pollard ([id number]) -------------------------------------------------------------------------------- Fall Risk Assessment Details [name]: Lindsey Pollard Date of Service: 06/30/2020 1:00 PM Medical Record Number: [id number] Patient Account Number: [id number] Date of Birth/Sex: Jan 28, 1941 (79 y.o. F) Treating RN: Huel Coventry Primary Care  Rashanda Magloire: Einar Crow Other Clinician: Referring Dessiree Sze: Einar Crow Treating Kerra Guilfoil/Extender: Rowan Blase in Treatment: 0 Fall Risk Assessment Items Have you had 2 or more falls in the last 12 monthso 0 No Have you had any fall that resulted in injury in the last 12 monthso 0 No FALLS RISK SCREEN History of falling - immediate or within 3 months 25 Yes Secondary diagnosis (Do you have 2 or more medical diagnoseso) 0 No Ambulatory aid None/bed rest/wheelchair/nurse 0 No Crutches/cane/walker 0 No Furniture 0 No Intravenous therapy Access/Saline/Heparin Lock 0 No  Gait/Transferring Normal/ bed rest/ wheelchair 0 No Weak (short steps with or without shuffle, stooped but able to lift head while walking, may 0 No seek support from furniture) Impaired (short steps with shuffle, may have difficulty arising from chair, head down, impaired 0 No balance) Mental Status Oriented to own ability 0 No Electronic Signature(s) Signed: 07/01/2020 5:26:41 PM By: Elliot Gurney, BSN, RN, CWS, Kim RN, BSN Entered By: Elliot Gurney, BSN, RN, CWS, Kim on 06/30/2020 13:46:55 Lindsey Pollard ([id number]) -------------------------------------------------------------------------------- Foot Assessment Details [name]: Lindsey Pollard Date of Service: 06/30/2020 1:00 PM Medical Record Number: [id number] Patient Account Number: [id number] Date of Birth/Sex: April 20, 1941 (79 y.o. F) Treating RN: Huel Coventry Primary Care Leonilda Cozby: Einar Crow Other Clinician: Referring Mckala Pantaleon: Einar Crow Treating Kataleena Holsapple/Extender: Rowan Blase in Treatment: 0 Foot Assessment Items Site Locations + = Sensation present, - = Sensation absent, C = Callus, U = Ulcer R = Redness, W = Warmth, M = Maceration, PU = Pre-ulcerative lesion F = Fissure, S = Swelling, D = Dryness Assessment Right: Left: Other Deformity: No No Prior Foot Ulcer: No No Prior Amputation: No No Charcot Joint: No  No Ambulatory Status: Ambulatory Without Help Gait: Steady Electronic Signature(s) Signed: 07/01/2020 5:26:41 PM By: Elliot Gurney, BSN, RN, CWS, Kim RN, BSN Entered By: Elliot Gurney, BSN, RN, CWS, Kim on 06/30/2020 13:47:17 Lindsey Pollard ([id number]) -------------------------------------------------------------------------------- Nutrition Risk Screening Details [name]: Lindsey Pollard Date of Service: 06/30/2020 1:00 PM Medical Record Number: [id number] Patient Account Number: [id number] Date of Birth/Sex: [birth year] (79 y.o. F) Treating RN: Huel Coventry Primary Care Kalani Sthilaire: Einar Crow Other Clinician: Referring Zailee Vallely: Einar Crow Treating Castor Gittleman/Extender: Rowan Blase in Treatment: 0 Height (in): 59 Weight (lbs): 153 Body Mass Index (BMI): 30.9 Nutrition Risk Screening Items Score Screening NUTRITION RISK SCREEN: I have an illness or condition that made me change the kind and/or amount of food I eat 0 No I eat fewer than two meals per day 0 No I eat few fruits and vegetables, or milk products 0 No I have three or more drinks of beer, liquor or wine almost every day 0 No I have tooth or mouth problems that make it hard for me to eat 0 No I don't always have enough money to buy the food I need 0 No I eat alone most of the time 0 No I take three or more different prescribed or over-the-counter drugs a day 0 No Without wanting to, I have lost or gained 10 pounds in the last six months 0 No I am not always physically able to shop, cook and/or feed myself 0 No Nutrition Protocols Good Risk Protocol 0 No interventions needed Moderate Risk Protocol High Risk Proctocol Risk Level: Good Risk Score: 0 Electronic Signature(s) Signed: 07/01/2020 5:26:41 PM By: Elliot Gurney, BSN, RN, CWS, Kim RN, BSN Entered By: Elliot Gurney, BSN, RN, CWS, Kim on 06/30/2020 13:47:07

## 2020-07-04 ENCOUNTER — Encounter (INDEPENDENT_AMBULATORY_CARE_PROVIDER_SITE_OTHER): Payer: Medicare Other | Admitting: Vascular Surgery

## 2020-07-04 ENCOUNTER — Encounter (INDEPENDENT_AMBULATORY_CARE_PROVIDER_SITE_OTHER): Payer: Medicare Other

## 2020-07-04 ENCOUNTER — Encounter (INDEPENDENT_AMBULATORY_CARE_PROVIDER_SITE_OTHER): Payer: Self-pay

## 2020-07-07 ENCOUNTER — Ambulatory Visit: Payer: Medicare Other | Admitting: Physician Assistant

## 2020-07-14 ENCOUNTER — Ambulatory Visit: Payer: Medicare Other | Admitting: Physician Assistant

## 2020-07-18 ENCOUNTER — Other Ambulatory Visit: Payer: Self-pay

## 2020-07-18 ENCOUNTER — Encounter: Payer: Medicare Other | Admitting: Physician Assistant

## 2020-07-18 DIAGNOSIS — L98492 Non-pressure chronic ulcer of skin of other sites with fat layer exposed: Secondary | ICD-10-CM | POA: Diagnosis not present

## 2020-07-18 DIAGNOSIS — E11622 Type 2 diabetes mellitus with other skin ulcer: Secondary | ICD-10-CM | POA: Diagnosis present

## 2020-07-18 DIAGNOSIS — I10 Essential (primary) hypertension: Secondary | ICD-10-CM | POA: Diagnosis not present

## 2020-07-18 DIAGNOSIS — L98412 Non-pressure chronic ulcer of buttock with fat layer exposed: Secondary | ICD-10-CM | POA: Diagnosis not present

## 2020-07-18 DIAGNOSIS — I2510 Atherosclerotic heart disease of native coronary artery without angina pectoris: Secondary | ICD-10-CM | POA: Diagnosis not present

## 2020-07-18 NOTE — Progress Notes (Signed)
Lindsey Pollard, Lindsey Pollard ([id number]) Visit Report for 07/18/2020 Arrival Information Details Patient Name: Lindsey Pollard, Lindsey Pollard Date of Service: 07/18/2020 1:45 PM Medical Record Number: [id number] Patient Account Number: [id number] Date of Birth/Sex: 1940-10-10 (79 y.o. F) Treating RN: Rogers Blocker Primary Care Christia Domke: Einar Crow Other Clinician: Referring Abby Tucholski: Einar Crow Treating Dallis Czaja/Extender: Rowan Blase in Treatment: 2 Visit Information History Since Last Visit Had a fall or experienced change in Yes Patient Arrived: Lindsey Pollard activities of daily living that may affect Arrival Time: 14:15 risk of falls: Accompanied By: self Hospitalized since last visit: No Transfer Assistance: None Pain Present Now: No Electronic Signature(s) Signed: 07/18/2020 4:33:18 PM By: Phillis Haggis, Dondra Prader RN Entered By: Phillis Haggis, Kenia on 07/18/2020 14:16:08 Lindsey Pollard ([id number]) -------------------------------------------------------------------------------- Clinic Level of Care Assessment Details Patient Name: Lindsey Pollard Date of Service: 07/18/2020 1:45 PM Medical Record Number: [id number] Patient Account Number: [id number] Date of Birth/Sex: 08/22/1940 (79 y.o. F) Treating RN: Rogers Blocker Primary Care Nazeer Romney: Einar Crow Other Clinician: Referring Nelson Noone: Einar Crow Treating Delray Reza/Extender: Rowan Blase in Treatment: 2 Clinic Level of Care Assessment Items TOOL 4 Quantity Score X - Use when only an EandM is performed on FOLLOW-UP visit 1 0 ASSESSMENTS - Nursing Assessment / Reassessment X - Reassessment of Co-morbidities (includes updates in patient status) 1 10 X- 1 5 Reassessment of Adherence to Treatment Plan ASSESSMENTS - Wound and Skin Assessment / Reassessment []  - Simple Wound Assessment / Reassessment - one wound 0 X- 1 5 Complex Wound Assessment / Reassessment - multiple wounds []  - 0 Dermatologic / Skin Assessment  (not related to wound area) ASSESSMENTS - Focused Assessment []  - Circumferential Edema Measurements - multi extremities 0 []  - 0 Nutritional Assessment / Counseling / Intervention []  - 0 Lower Extremity Assessment (monofilament, tuning fork, pulses) []  - 0 Peripheral Arterial Disease Assessment (using hand held doppler) ASSESSMENTS - Ostomy and/or Continence Assessment and Care []  - Incontinence Assessment and Management 0 []  - 0 Ostomy Care Assessment and Management (repouching, etc.) PROCESS - Coordination of Care X - Simple Patient / Family Education for ongoing care 1 15 []  - 0 Complex (extensive) Patient / Family Education for ongoing care []  - 0 Staff obtains , Records, Test Results / Process Orders []  - 0 Staff telephones HHA, Nursing Homes / Clarify orders / etc []  - 0 Routine Transfer to another Facility (non-emergent condition) []  - 0 Routine Hospital Admission (non-emergent condition) []  - 0 New Admissions / / Ordering NPWT, Apligraf, etc. []  - 0 Emergency Hospital Admission (emergent condition) X- 1 10 Simple Discharge Coordination []  - 0 Complex (extensive) Discharge Coordination PROCESS - Special Needs []  - Pediatric / Minor Patient Management 0 []  - 0 Isolation Patient Management []  - 0 Hearing / Language / Visual special needs []  - 0 Assessment of Community assistance (transportation, D/C planning, etc.) []  - 0 Additional assistance / Altered mentation []  - 0 Support Surface(s) Assessment (bed, cushion, seat, etc.) INTERVENTIONS - Wound Cleansing / Measurement Lindsey Pollard, Lindsey Pollard. ( ) []  - 0 Simple Wound Cleansing - one wound X- 1 5 Complex Wound Cleansing - multiple wounds X- 1 5 Wound Imaging (photographs - any number of wounds) []  - 0 Wound Tracing (instead of photographs) []  - 0 Simple Wound Measurement - one wound X- 1 5 Complex Wound Measurement - multiple wounds INTERVENTIONS - Wound Dressings []  -  Small Wound Dressing one or multiple wounds 0 X- 1 15 Medium Wound Dressing one or multiple wounds []  -  0 Large Wound Dressing one or multiple wounds []  - 0 Application of Medications - topical []  - 0 Application of Medications - injection INTERVENTIONS - Miscellaneous []  - External ear exam 0 []  - 0 Specimen Collection (cultures, biopsies, blood, body fluids, etc.) []  - 0 Specimen(s) / Culture(s) sent or taken to Lab for analysis []  - 0 Patient Transfer (multiple staff / / Similar devices) []  - 0 Simple Staple / Suture removal (25 or less) []  - 0 Complex Staple / Suture removal (26 or more) []  - 0 Hypo / Hyperglycemic Management (close monitor of Blood Glucose) []  - 0 Ankle / Brachial Index (ABI) - do not check if billed separately X- 1 5 Vital Signs Has the patient been seen at the hospital within the last three years: Yes Total Score: 80 Level Of Care: New/Established - Level 3 Electronic Signature(s) Signed: 07/18/2020 4:33:18 PM By: , RN Entered By: , Kenia on 07/18/2020 14:46:56 Nurse, adult ( ) -------------------------------------------------------------------------------- Encounter Discharge Information Details Patient Name: Date of Service: 07/18/2020 1:45 PM Medical Record Number: Patient Account Number: 07/20/2020 Date of Birth/Sex: 10/13/1940 (79 y.o. F) Treating RN: Phillis Haggis Primary Care Jerilee Space: 07/20/2020 Other Clinician: Referring Ardella Chhim: Lindsey Pollard Treating Elianys Conry/Extender: [id number] in Treatment: 2 Encounter Discharge Information Items Discharge Condition: Stable Ambulatory Status: Cane Discharge Destination: Home Transportation: Private Auto Accompanied By: self Schedule Follow-up Appointment: Yes Clinical Summary of Care: Electronic Signature(s) Signed: 07/18/2020 4:33:18 PM By: 07/20/2020, [id number] RN Entered By: [id number], 11/09/1940 on 07/18/2020 14:47:42 Rogers Blocker (Einar Crow) -------------------------------------------------------------------------------- Lower Extremity Assessment Details Patient Name: Einar Crow Date of Service: 07/18/2020 1:45 PM Medical Record Number: 07/20/2020 Patient Account Number: Phillis Haggis Date of Birth/Sex: 1941/01/20 (79 y.o. F) Treating RN: Dondra Prader Primary Care Hansini Clodfelter: 07/20/2020 Other Clinician: Referring Cliffie Gingras: Lindsey Pollard Treating Rhiley Solem/Extender: [id number] Weeks in Treatment: 2 Electronic Signature(s) Signed: 07/18/2020 4:33:18 PM By: 07/20/2020, [id number] RN Entered By: [id number], 11/09/1940 on 07/18/2020 14:27:56 Rogers Blocker (Einar Crow) -------------------------------------------------------------------------------- Multi Wound Chart Details Patient Name: Einar Crow Date of Service: 07/18/2020 1:45 PM Medical Record Number: 07/20/2020 Patient Account Number: Phillis Haggis Date of Birth/Sex: 1940-07-21 (79 y.o. F) Treating RN: Dondra Prader Primary Care Chiquita Heckert: 07/20/2020 Other Clinician: Referring Braylyn Kalter: Lindsey Pollard Treating Clorine Swing/Extender: [id number] in Treatment: 2 Vital Signs Height(in): 59 Pulse(bpm): 67 Weight(lbs): 153 Blood Pressure(mmHg): 160/65 Body Mass Index(BMI): 31 Temperature(F): 98.5 Respiratory Rate(breaths/min): 18 Photos: [N/A:N/A] Wound Location: Right Gluteal fold Left Labia N/A Wounding Event: Gradually Appeared Gradually Appeared N/A Primary Etiology: Abscess To be determined N/A Comorbid History: Cataracts, Hypertension, Myocardial Cataracts, Hypertension, Myocardial N/A Infarction, Type II Diabetes, History Infarction, Type II Diabetes, History of pressure wounds, Osteoarthritis of pressure wounds, Osteoarthritis Date Acquired: 05/30/2020 05/30/2020 N/A Weeks of Treatment: 2 2 N/A Wound Status: Open Open N/A Measurements L x W x D (cm) 0.6x0.6x0.1  0.3x0.4x0.1 N/A Area (cm) : 0.283 0.094 N/A Volume (cm) : 0.028 0.009 N/A % Reduction in Area: 76.00% 93.40% N/A % Reduction in Volume: 88.10% 96.80% N/A Classification: Full Thickness Without Exposed Full Thickness Without Exposed N/A Support Structures Support Structures Exudate Amount: Medium Medium N/A Exudate Type: Serous Serous N/A Exudate Color: amber amber N/A Wound Margin: Flat and Intact Flat and Intact N/A Granulation Amount: Large (67-100%) Large (67-100%) N/A Granulation Quality: Red Pink, Pale N/A Necrotic Amount: None Present (0%) None Present (0%) N/A Exposed Structures: Fat Layer (Subcutaneous Tissue): Fat Layer (Subcutaneous  Tissue): N/A Yes Yes Fascia: No Fascia: No Tendon: No Tendon: No Muscle: No Muscle: No Joint: No Joint: No Bone: No Bone: No Epithelialization: None None N/A Treatment Notes Electronic Signature(s) Signed: 07/18/2020 4:33:18 PM By: Phillis Haggis, Dondra Prader RN Entered By: Phillis Haggis, Dondra Prader on 07/18/2020 14:44:38 Lindsey Pollard ([id number]) -------------------------------------------------------------------------------- Multi-Disciplinary Care Plan Details Patient Name: Lindsey Pollard Date of Service: 07/18/2020 1:45 PM Medical Record Number: [id number] Patient Account Number: [id number] Date of Birth/Sex: 10/08/1940 (79 y.o. F) Treating RN: Rogers Blocker Primary Care Rumeal Cullipher: Einar Crow Other Clinician: Referring Oshen Wlodarczyk: Einar Crow Treating Christinamarie Tall/Extender: Rowan Blase in Treatment: 2 Active Inactive Abuse / Safety / Falls / Self Care Management Nursing Diagnoses: History of Falls Goals: Patient will remain injury free related to falls Date Initiated: 07/18/2020 Target Resolution Date: 08/08/2020 Goal Status: Active Interventions: Provide education on fall prevention Provide education on personal and home safety Notes: Orientation to the Wound Care Program Nursing Diagnoses: Knowledge deficit  related to the wound healing center program Goals: Patient/caregiver will verbalize understanding of the Wound Healing Center Program Date Initiated: 06/30/2020 Target Resolution Date: 06/30/2020 Goal Status: Active Interventions: Provide education on orientation to the wound center Notes: Wound/Skin Impairment Nursing Diagnoses: Impaired tissue integrity Goals: Patient/caregiver will verbalize understanding of skin care regimen Date Initiated: 06/30/2020 Target Resolution Date: 06/30/2020 Goal Status: Active Ulcer/skin breakdown will have a volume reduction of 30% by week 4 Date Initiated: 06/30/2020 Target Resolution Date: 07/29/2020 Goal Status: Active Ulcer/skin breakdown will have a volume reduction of 50% by week 8 Date Initiated: 06/30/2020 Target Resolution Date: 08/26/2020 Goal Status: Active Ulcer/skin breakdown will have a volume reduction of 80% by week 12 Date Initiated: 06/30/2020 Target Resolution Date: 09/26/2020 Goal Status: Active Ulcer/skin breakdown will heal within 14 weeks Date Initiated: 06/30/2020 Target Resolution Date: 10/10/2020 Goal Status: Active Interventions: Lindsey Pollard, Lindsey Pollard ([id number]) Assess ulceration(s) every visit Treatment Activities: Skin care regimen initiated : 06/30/2020 Notes: Electronic Signature(s) Signed: 07/18/2020 4:33:18 PM By: Phillis Haggis, Dondra Prader RN Entered By: Phillis Haggis, Kenia on 07/18/2020 14:44:29 Lindsey Pollard ([id number]) -------------------------------------------------------------------------------- Pain Assessment Details Patient Name: Lindsey Pollard Date of Service: 07/18/2020 1:45 PM Medical Record Number: [id number] Patient Account Number: [id number] Date of Birth/Sex: [birth year] (79 y.o. F) Treating RN: Rogers Blocker Primary Care Kaelin Bonelli: Einar Crow Other Clinician: Referring Hani Campusano: Einar Crow Treating Crystalee Ventress/Extender: Rowan Blase in Treatment: 2 Active Problems Location of Pain Severity and  Description of Pain Patient Has Paino No Site Locations Rate the pain. Current Pain Level: 0 Pain Management and Medication Current Pain Management: Electronic Signature(s) Signed: 07/18/2020 4:33:18 PM By: Phillis Haggis, Dondra Prader RN Entered By: Phillis Haggis, Kenia on 07/18/2020 14:18:30 Lindsey Pollard ([id number]) -------------------------------------------------------------------------------- Patient/Caregiver Education Details Patient Name: Lindsey Pollard Date of Service: 07/18/2020 1:45 PM Medical Record Number: [id number] Patient Account Number: [id number] Date of Birth/Gender: 1940/12/16 (79 y.o. F) Treating RN: Rogers Blocker Primary Care Physician: Einar Crow Other Clinician: Referring Physician: Einar Crow Treating Physician/Extender: Rowan Blase in Treatment: 2 Education Assessment Education Provided To: Patient Education Topics Provided Pressure: Methods: Explain/Verbal Responses: State content correctly Electronic Signature(s) Signed: 07/18/2020 4:33:18 PM By: Phillis Haggis, Dondra Prader RN Entered By: Phillis Haggis, Kenia on 07/18/2020 14:47:12 Lindsey Pollard ([id number]) -------------------------------------------------------------------------------- Wound Assessment Details Patient Name: Lindsey Pollard Date of Service: 07/18/2020 1:45 PM Medical Record Number: [id number] Patient Account Number: [id number] Date of Birth/Sex: [birth year] (79 y.o. F) Treating RN: Rogers Blocker Primary Care Fionn Stracke: Einar Crow Other Clinician: Referring Adrionna Delcid: Einar Crow Treating  Aesha Agrawal/Extender: Allen DerryStone, Hoyt Weeks in Treatment: 2 Wound Status Wound Number: 2 Primary Abscess Etiology: Wound Location: Right Gluteal fold Wound Open Wounding Event: Gradually Appeared Status: Date Acquired: 05/30/2020 Comorbid Cataracts, Hypertension, Myocardial Infarction, Type II Weeks Of Treatment: 2 History: Diabetes, History of pressure wounds,  Osteoarthritis Clustered Wound: No Photos Wound Measurements Length: (cm) 0.6 Width: (cm) 0.6 Depth: (cm) 0.1 Area: (cm) 0.283 Volume: (cm) 0.028 % Reduction in Area: 76% % Reduction in Volume: 88.1% Epithelialization: None Tunneling: No Undermining: No Wound Description Classification: Full Thickness Without Exposed Support Structures Wound Margin: Flat and Intact Exudate Amount: Medium Exudate Type: Serous Exudate Color: amber Foul Odor After Cleansing: No Slough/Fibrino No Wound Bed Granulation Amount: Large (67-100%) Exposed Structure Granulation Quality: Red Fascia Exposed: No Necrotic Amount: None Present (0%) Fat Layer (Subcutaneous Tissue) Exposed: Yes Tendon Exposed: No Muscle Exposed: No Joint Exposed: No Bone Exposed: No Treatment Notes Wound #2 (Gluteal fold) Wound Laterality: Right Cleanser Normal Saline Discharge Instruction: Wash your hands with soap and water. Remove old dressing, discard into plastic bag and place into trash. Cleanse the wound with Normal Saline prior to applying a clean dressing using gauze sponges, not tissues or cotton balls. Do not scrub or use excessive force. Pat dry using gauze sponges, not tissue or cotton balls. Lindsey Pollard, Lindsey Pollard. ([id number]) Peri-Wound Care Topical Primary Dressing Prisma 4.34 (in) Discharge Instruction: Moisten w/normal saline or sterile water; Cover wound as directed. Do not remove from wound bed. Secondary Dressing Secured With Compression Wrap Compression Stockings Add-Ons Coverlet Latex-Free Fabric Adhesive Dressings Discharge Instruction: Nurse, learning disabilitylastic Knuckle Bandage Electronic Signature(s) Signed: 07/18/2020 4:33:18 PM By: Phillis HaggisSanchez Pereyda, Dondra PraderKenia RN Entered By: Phillis HaggisSanchez Pereyda, Kenia on 07/18/2020 14:26:27 Lindsey Pollard, Lindsey Pollard. ([id number]) -------------------------------------------------------------------------------- Wound Assessment Details Patient Name: Lindsey Pollard, Lindsey Pollard. Date of Service: 07/18/2020 1:45  PM Medical Record Number: [id number] Patient Account Number: [id number] Date of Birth/Sex: [birth year] (79 y.o. F) Treating RN: Rogers BlockerSanchez, Kenia Primary Care Alicea Wente: Einar CrowAnderson, Marshall Other Clinician: Referring Quinci Gavidia: Einar CrowAnderson, Marshall Treating Joslyn Ramos/Extender: Rowan BlaseStone, Hoyt Weeks in Treatment: 2 Wound Status Wound Number: 3 Primary To be determined Etiology: Wound Location: Left Labia Wound Open Wounding Event: Gradually Appeared Status: Date Acquired: 05/30/2020 Comorbid Cataracts, Hypertension, Myocardial Infarction, Type II Weeks Of Treatment: 2 History: Diabetes, History of pressure wounds, Osteoarthritis Clustered Wound: No Photos Wound Measurements Length: (cm) 0.3 Width: (cm) 0.4 Depth: (cm) 0.1 Area: (cm) 0.094 Volume: (cm) 0.009 % Reduction in Area: 93.4% % Reduction in Volume: 96.8% Epithelialization: None Tunneling: No Undermining: No Wound Description Classification: Full Thickness Without Exposed Support Structu Wound Margin: Flat and Intact Exudate Amount: Medium Exudate Type: Serous Exudate Color: amber res Foul Odor After Cleansing: No Slough/Fibrino Yes Wound Bed Granulation Amount: Large (67-100%) Exposed Structure Granulation Quality: Pink, Pale Fascia Exposed: No Necrotic Amount: None Present (0%) Fat Layer (Subcutaneous Tissue) Exposed: Yes Tendon Exposed: No Muscle Exposed: No Joint Exposed: No Bone Exposed: No Treatment Notes Wound #3 (Labia) Wound Laterality: Left Cleanser Peri-Wound Care Topical Lindsey Pollard, Lindsey Pollard. ([id number]) Primary Dressing Prisma 4.34 (in) Discharge Instruction: Moisten w/normal saline or sterile water; Cover wound as directed. Do not remove from wound bed. Secondary Dressing Secured With Compression Wrap Compression Stockings Add-Ons Coverlet Latex-Free Fabric Adhesive Dressings Discharge Instruction: Nurse, learning disabilitylastic Knuckle Bandage Electronic Signature(s) Signed: 07/18/2020 4:33:18 PM By: Phillis HaggisSanchez Pereyda, Dondra PraderKenia  RN Entered By: Phillis HaggisSanchez Pereyda, Kenia on 07/18/2020 14:27:02 Lindsey Pollard, Lindsey Pollard. ([id number]) -------------------------------------------------------------------------------- Vitals Details Patient Name: Lindsey Pollard, [place] Pollard. Date of Service: 07/18/2020 1:45 PM Medical Record Number: [id number] Patient Account Number: [id number]  Date of Birth/Sex: Jan 11, 1941 [age] y.o. F) Treating RN: Rogers BlockerSanchez, Kenia Primary Care Maleiyah Releford: Einar CrowAnderson, Marshall Other Clinician: Referring Irby Fails: Einar CrowAnderson, Marshall Treating Zahirah Cheslock/Extender: Rowan BlaseStone, Hoyt Weeks in Treatment: 2 Vital Signs Time Taken: 14:16 Temperature (F): 98.5 Height (in): 59 Pulse (bpm): 67 Weight (lbs): 153 Respiratory Rate (breaths/min): 18 Body Mass Index (BMI): 30.9 Blood Pressure (mmHg): 160/65 Reference Range: 80 - 120 mg / dl Electronic Signature(s) Signed: 07/18/2020 4:33:18 PM By: Phillis HaggisSanchez Pereyda, Dondra PraderKenia RN Entered By: Phillis HaggisSanchez Pereyda, Dondra PraderKenia on 07/18/2020 14:18:03

## 2020-07-18 NOTE — Progress Notes (Signed)
[name], [name] ([id number]) Visit Report for 07/18/2020 Fall Risk Assessment Details Patient Name: Lindsey Pollard, Lindsey Pollard Date of Service: 07/18/2020 1:45 PM Medical Record Number: [id number] Patient Account Number: [id number] Date of Birth/Sex: Dec 06, 1940 (79 y.o. F) Treating RN: Rogers Blocker Primary Care Cadon Raczka: Einar Crow Other Clinician: Referring Hiral Lukasiewicz: Einar Crow Treating Darcel Zick/Extender: Rowan Blase in Treatment: 2 Fall Risk Assessment Items Have you had 2 or more falls in the last 12 monthso 0 Yes Have you had any fall that resulted in injury in the last 12 monthso 0 No FALLS RISK SCREEN History of falling - immediate or within 3 months 25 Yes Secondary diagnosis (Do you have 2 or more medical diagnoseso) 15 Yes Ambulatory aid None/bed rest/wheelchair/nurse 0 No Crutches/cane/walker 15 Yes Furniture 0 No Intravenous therapy Access/Saline/Heparin Lock 0 No Gait/Transferring Normal/ bed rest/ wheelchair 0 Yes Weak (short steps with or without shuffle, stooped but able to lift head while walking, may 0 No seek support from furniture) Impaired (short steps with shuffle, may have difficulty arising from chair, head down, impaired 0 No balance) Mental Status Oriented to own ability 0 Yes Electronic Signature(s) Signed: 07/18/2020 4:33:18 PM By: Phillis Haggis, Dondra Prader RN Entered By: Phillis Haggis, Dondra Prader on 07/18/2020 14:29:07

## 2020-07-18 NOTE — Progress Notes (Addendum)
GENA, LASKI ([id number]) Visit Report for 07/18/2020 Chief Complaint Document Details Patient Name: Lindsey Pollard, Lindsey Pollard Date of Service: 07/18/2020 1:45 PM Medical Record Number: [id number] Patient Account Number: [id number] Date of Birth/Sex: 05/19/1941 (80 y.o. F) Treating RN: Huel Coventry Primary Care Provider: Einar Crow Other Clinician: Referring Provider: Einar Crow Treating Provider/Extender: Rowan Blase in Treatment: 2 Information Obtained from: Patient Chief Complaint Right gluteal fold pressure ulcer Electronic Signature(s) Signed: 07/18/2020 2:00:51 PM By: Lenda Kelp PA-C Entered By: Lenda Kelp on 07/18/2020 14:00:50 Lindsey Pollard ([id number]) -------------------------------------------------------------------------------- HPI Details Patient Name: Lindsey Pollard Date of Service: 07/18/2020 1:45 PM Medical Record Number: [id number] Patient Account Number: [id number] Date of Birth/Sex: March 26, 1941 (80 y.o. F) Treating RN: Huel Coventry Primary Care Provider: Einar Crow Other Clinician: Referring Provider: Einar Crow Treating Provider/Extender: Rowan Blase in Treatment: 2 History of Present Illness HPI Description: 05/06/2020 on evaluation today patient presents for initial inspection here in our clinic concerning an issue that she is having with a wound in the right gluteal fold region. She has been tolerating some dressing changes she has been performing at home mainly she been putting a little bit of ointment on this and keeping it covered. She notes that this has been present for about 2 months and she is noted some improvement compared to where it started but unfortunately not a significant amount to where she is pleased with how things have gone. Nonetheless she did come in to be seen in order to see if there is anything we can do to aid in helping the area to heal more effectively and quickly. Fortunately there is no signs  of active infection at this time. No fevers, chills, nausea, vomiting, or diarrhea. She has diabetic and on 05/04/19 her most recent reading of 7.9. She also has a history of heart disease of note. She also has hypertension. Her blood pressure is elevated today as well. She follows up with her primary care provider on a regular basis in regard to this. 07/17/2019 on evaluation today patient appears to be doing well with regard to her wound at this time. She has been tolerating the dressing changes without complication. Fortunately she is able to do this herself and seems to be doing quite well. There does not appear to be any signs of active infection at this time which is good news. No fevers, chills, nausea, vomiting, or diarrhea. 07/28/2019 on evaluation today patient appears to be doing a little bit worse today compared to previously noted in regard to her wound. Unfortunately she seems to be having slight hypergranulation I am not exactly sure why this is. Fortunately there is no signs of active infection at this time. No fevers, chills, nausea, vomiting, or diarrhea. With that being said it may be that this is more of a moisture issue this is somewhat in a crease in the gluteal fold region unfortunately that is making things a little bit more severe in my opinion. 08/04/2019 upon evaluation today patient appears to be doing excellent in regard to her wound. In fact this is almost completely healed and seems to have made great progress since last week. Fortunately there is no signs of active infection at this time. 08/11/2019 upon evaluation today patient actually appears to be completely healed she is doing excellent at this point. Fortunately there is no signs of active infection which is also great news. I am very pleased with how things are progressing. 06/30/2020 upon evalu somewhat poorly in  regard to the 2 wounds that apparently appeared about a month ago. These appear to likely be abscesses in  nature although to be honest I cannot be for sure where this came from. It definitely does not appear to be pressure to me and other than abscess I really do not see an obvious other etiology at this point. She does have a rash in the inguinal area I think this is probably fungal in nature and I think some nystatin powder could be beneficial for with that being said I do not think that this is the cause of the wounds themselves. Overall today patient appears to be doing Quite well. 07/18/2020 upon evaluation today patient actually appears to be doing excellent in regard to her wounds. In fact both are showing signs of excellent improvement and seem to be doing quite well. I am extremely pleased with where we stand today. Electronic Signature(s) Signed: 07/18/2020 3:47:59 PM By: Lenda KelpStone III, Muranda Coye PA-C Entered By: Lenda KelpStone III, Dacia Capers on 07/18/2020 15:47:59 Lindsey Pollard, Pascha M. ([id number]) -------------------------------------------------------------------------------- Physical Exam Details Patient Name: Lindsey Pollard, [name] Date of Service: 07/18/2020 1:45 PM Medical Record Number: [id number] Patient Account Number: [id number] Date of Birth/Sex: 10/31/1940 (80 y.o. F) Treating RN: Huel CoventryWoody, Kim Primary Care Provider: Einar CrowAnderson, Marshall Other Clinician: Referring Provider: Einar CrowAnderson, Marshall Treating Provider/Extender: Rowan BlaseStone, Jaselyn Nahm Weeks in Treatment: 2 Constitutional Well-nourished and well-hydrated in no acute distress. Respiratory normal breathing without difficulty. Psychiatric this patient is able to make decisions and demonstrates good insight into disease process. Alert and Oriented x 3. pleasant and cooperative. Notes On inspection patient's wound bed again showed signs of excellent granulation and epithelization she is measuring smaller at both wound locations and I do think she is taking great care of this. Electronic Signature(s) Signed: 07/18/2020 3:48:24 PM By: Lenda KelpStone III, Farron Lafond PA-C Entered By: Lenda KelpStone  III, Jeyren Danowski on 07/18/2020 15:48:24 Lindsey Pollard, Noreene M. ([id number]) -------------------------------------------------------------------------------- Physician Orders Details Patient Name: Lindsey Pollard, Kewanna M. Date of Service: 07/18/2020 1:45 PM Medical Record Number: [id number] Patient Account Number: [id number] Date of Birth/Sex: 10/31/1940 (80 y.o. 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 Bathing/ Shower/ Hygiene o May shower; gently cleanse wound with antibacterial soap, rinse and pat dry prior to dressing wounds Off-Loading Wound #2 Right Gluteal fold o Turn and reposition every 2 hours o Other: - Keep pressure off of wounded areas. Wound #3 Left Labia o Turn and reposition every 2 hours o Other: - Keep pressure off of wounded areas. Wound Treatment Wound #2 - Gluteal fold Wound Laterality: Right Cleanser: Normal Saline (Generic) 3 x Per Week/15 Days Discharge Instructions: Wash your hands with soap and water. Remove old dressing, discard into plastic bag and place into trash. Cleanse the wound with Normal Saline prior to applying a clean dressing using gauze sponges, not tissues or cotton balls. Do not scrub or use excessive force. Pat dry using gauze sponges, not tissue or cotton balls. Primary Dressing: Prisma 4.34 (in) 3 x Per Week/15 Days Discharge Instructions: Moisten  w/normal saline or sterile water; Cover  wound as directed. Do not remove from wound bed. Add-Ons: Coverlet Latex-Free Fabric Adhesive Dressings 3 x Per Week/15 Days Discharge Instructions: Elastic Knuckle Bandage Wound #3 - Labia Wound Laterality: Left Primary Dressing: Prisma 4.34 (in) 3 x Per Week/15 Days Discharge Instructions: Moisten w/normal saline or sterile water; Cover wound as directed. Do not remove from wound bed. Add-Ons: Coverlet Latex-Free Fabric Adhesive Dressings 3 x Per Week/15 Days Discharge Instructions: Elastic Knuckle Bandage Electronic Signature(s) Signed: 07/18/2020 4:33:18 PM By: Phillis Haggis, Dondra Prader RN Signed: 07/18/2020 5:09:34 PM By: Lenda Kelp PA-C Entered By: Phillis Haggis, Dondra Prader on 07/18/2020 14:46:25 Lindsey Pollard ([id number]) -------------------------------------------------------------------------------- Problem List Details Patient Name: Lindsey Pollard Date of Service: 07/18/2020 1:45 PM Medical Record Number: [id number] Patient Account Number: [id number] Date of Birth/Sex: 1940/12/18 (80 y.o. F) Treating RN: Huel Coventry Primary Care Provider: Einar Crow Other Clinician: Referring Provider: Einar Crow Treating Provider/Extender: Rowan Blase in Treatment: 2 Active Problems ICD-10 Encounter Code Description Active Date MDM Diagnosis [phone] Non-pressure chronic ulcer of buttock with fat layer exposed 06/30/2020 No Yes L98.492 Non-pressure chronic ulcer of skin of other sites with fat layer exposed 06/30/2020 No Yes E11.622 Type 2 diabetes mellitus with other skin ulcer 06/30/2020 No Yes I10 Essential (primary) hypertension 06/30/2020 No Yes I25.10 Atherosclerotic heart disease of native coronary artery without angina 06/30/2020 No Yes pectoris Inactive Problems Resolved Problems Electronic Signature(s) Signed: 07/18/2020 2:00:43 PM By: Lenda Kelp PA-C Entered By: Lenda Kelp on 07/18/2020 14:00:42 Lindsey Pollard  ([id number]) -------------------------------------------------------------------------------- Progress Note Details Patient Name: Lindsey Pollard Date of Service: 07/18/2020 1:45 PM Medical Record Number: [id number] Patient Account Number: [id number] Date of Birth/Sex: September 19, 1940 (80 y.o. F) Treating RN: Huel Coventry Primary Care Provider: Einar Crow Other Clinician: Referring Provider: Einar Crow Treating Provider/Extender: Rowan Blase in Treatment: 2 Subjective Chief Complaint Information obtained from Patient Right gluteal fold pressure ulcer History of Present Illness (HPI) 05/06/2020 on evaluation today patient presents for initial inspection here in our clinic concerning an issue that she is having with a wound in the right gluteal fold region. She has been tolerating some dressing changes she has been performing at home mainly she been putting a little bit of ointment on this and keeping it covered. She notes that this has been present for about 2 months and she is noted some improvement compared to where it started but unfortunately not a significant amount to where she is pleased with how things have gone. Nonetheless she did come in to be seen in order to see if there is anything we can do to aid in helping the area to heal more effectively and quickly. Fortunately there is no signs of active infection at this time. No fevers, chills, nausea, vomiting, or diarrhea. She has diabetic and on 05/04/19 her most recent reading of 7.9. She also has a history of heart disease of note. She also has hypertension. Her blood pressure is elevated today as well. She follows up with her primary care provider on a regular basis in regard to this. 07/17/2019 on evaluation today patient appears to be doing well with regard to her wound at this time. She has been tolerating the dressing changes without complication. Fortunately she is able to do this herself and seems to be doing quite  well. There does not appear to be any signs of active infection at this time which is good news. No fevers, chills, nausea, vomiting, or diarrhea. 07/28/2019 on evaluation today patient appears to  be doing a little bit worse today compared to previously noted in regard to her wound. Unfortunately she seems to be having slight hypergranulation I am not exactly sure why this is. Fortunately there is no signs of active infection at this time. No fevers, chills, nausea, vomiting, or diarrhea. With that being said it may be that this is more of a moisture issue this is somewhat in a crease in the gluteal fold region unfortunately that is making things a little bit more severe in my opinion. 08/04/2019 upon evaluation today patient appears to be doing excellent in regard to her wound. In fact this is almost completely healed and seems to have made great progress since last week. Fortunately there is no signs of active infection at this time. 08/11/2019 upon evaluation today patient actually appears to be completely healed she is doing excellent at this point. Fortunately there is no signs of active infection which is also great news. I am very pleased with how things are progressing. 06/30/2020 upon evalu somewhat poorly in regard to the 2 wounds that apparently appeared about a month ago. These appear to likely be abscesses in nature although to be honest I cannot be for sure where this came from. It definitely does not appear to be pressure to me and other than abscess I really do not see an obvious other etiology at this point. She does have a rash in the inguinal area I think this is probably fungal in nature and I think some nystatin powder could be beneficial for with that being said I do not think that this is the cause of the wounds themselves. Overall today patient appears to be doing Quite well. 07/18/2020 upon evaluation today patient actually appears to be doing excellent in regard to her wounds. In  fact both are showing signs of excellent improvement and seem to be doing quite well. I am extremely pleased with where we stand today. Objective Constitutional Well-nourished and well-hydrated in no acute distress. Vitals Time Taken: 2:16 PM, Height: 59 in, Weight: 153 lbs, BMI: 30.9, Temperature: 98.5 F, Pulse: 67 bpm, Respiratory Rate: 18 breaths/min, Blood Pressure: 160/65 mmHg. Respiratory normal breathing without difficulty. Psychiatric this patient is able to make decisions and demonstrates good insight into disease process. Alert and Oriented x 3. pleasant and cooperative. General Notes: On inspection patient's wound bed again showed signs of excellent granulation and epithelization she is measuring smaller at both [facility]. ([id number]) wound locations and I do think she is taking great care of this. Integumentary (Hair, Skin) Wound #2 status is Open. Original cause of wound was Gradually Appeared. The wound is located on the Right Gluteal fold. The wound measures 0.6cm length x 0.6cm width x 0.1cm depth; 0.283cm^2 area and 0.028cm^3 volume. There is Fat Layer (Subcutaneous Tissue) exposed. There is no tunneling or undermining noted. There is a medium amount of serous drainage noted. The wound margin is flat and intact. There is large (67-100%) red granulation within the wound bed. There is no necrotic tissue within the wound bed. Wound #3 status is Open. Original cause of wound was Gradually Appeared. The wound is located on the Left Labia. The wound measures 0.3cm length x 0.4cm width x 0.1cm depth; 0.094cm^2 area and 0.009cm^3 volume. There is Fat Layer (Subcutaneous Tissue) exposed. There is no tunneling or undermining noted. There is a medium amount of serous drainage noted. The wound margin is flat and intact. There is large (67- 100%) pink, pale granulation within the wound  bed. There is no necrotic tissue within the wound bed. 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 Bathing/ Shower/ Hygiene: May shower; gently cleanse wound with antibacterial soap, rinse and pat dry prior to dressing wounds Off-Loading: Wound #2 Right Gluteal fold: Turn and reposition every 2 hours Other: - Keep pressure off of wounded areas. Wound #3 Left Labia: Turn and reposition every 2 hours Other: - Keep pressure off of wounded areas. WOUND #2: - Gluteal fold Wound Laterality: Right Cleanser: Normal Saline (Generic) 3 x Per Week/15 Days Discharge Instructions: Wash your hands with soap and water. Remove old dressing, discard into plastic bag and place into trash. Cleanse the wound with Normal Saline prior to applying a clean dressing using gauze sponges, not tissues or cotton balls. Do not scrub or use excessive force. Pat dry using gauze sponges, not tissue or cotton balls. Primary Dressing: Prisma 4.34 (in) 3 x Per Week/15 Days Discharge Instructions: Moisten w/normal saline or sterile water; Cover wound as directed. Do not remove from wound bed. Add-Ons: Coverlet Latex-Free Fabric Adhesive Dressings 3 x Per Week/15 Days Discharge Instructions: Elastic Knuckle Bandage WOUND #3: - Labia Wound Laterality: Left Primary Dressing: Prisma 4.34 (in) 3 x Per Week/15 Days Discharge Instructions: Moisten w/normal saline or sterile water; Cover wound as directed. Do not remove from wound bed. Add-Ons: Coverlet Latex-Free Fabric Adhesive Dressings 3 x Per Week/15 Days Discharge Instructions: Elastic Knuckle Bandage 1. Would recommend currently that we go ahead and continue with the silver collagen I think that is done  well for her. 2. I am also can recommend at this time that we have the patient go ahead and continue to monitor for any signs of infection. Obviously right now nothing appears to be infected if that changes she will let me know. 3. I am also going to suggest that she continue to offload and prevent any pressure excessively to the regions obviously I think that is a good way to handle things here. We will see patient back for reevaluation in 1 week here in the clinic. If anything worsens or changes patient will contact our office for additional recommendations. Electronic Signature(s) Signed: 07/18/2020 3:48:51 PM By: Parthenia Ames, [place] ([id number]) Entered By: Lenda Kelp on 07/18/2020 15:48:51 Lindsey Pollard ([id number]) -------------------------------------------------------------------------------- SuperBill Details Patient Name: Lindsey Pollard Date of Service: 07/18/2020 Medical Record Number: [id number] Patient Account Number: [id number] Date of Birth/Sex: 01-03-1941 (80 y.o. 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 E11.622 Type 2 diabetes mellitus with other skin ulcer I10 Essential (primary) hypertension I25.10 Atherosclerotic heart disease of native coronary artery without angina pectoris Facility Procedures CPT4 Code: [id number] Description: 99213 - WOUND CARE VISIT-LEV 3 EST PT Modifier: Quantity: 1 Physician Procedures CPT4 Code: 9381017 Description: 99213 - WC PHYS LEVEL 3 - EST PT Modifier: Quantity: 1 CPT4 Code: Description: ICD-10 Diagnosis Description L98.492 Non-pressure chronic ulcer of skin of other  sites with fat layer expos L98.412  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

## 2020-07-25 ENCOUNTER — Other Ambulatory Visit: Payer: Self-pay

## 2020-07-25 ENCOUNTER — Encounter: Payer: Medicare Other | Admitting: Physician Assistant

## 2020-07-25 DIAGNOSIS — E11622 Type 2 diabetes mellitus with other skin ulcer: Secondary | ICD-10-CM | POA: Diagnosis not present

## 2020-07-25 NOTE — Progress Notes (Addendum)
Lindsey Pollard, Videl M. ([id number]) Visit Report for 07/25/2020 Chief Complaint Document Details Patient Name: Lindsey Pollard, Lindsey M. Date of Service: 07/25/2020 1:45 PM Medical Record Number: [id number] Patient Account Number: [id number] Date of Birth/Sex: [birth year] (80 y.o. F) Treating RN: Yevonne PaxEpps, Carrie Primary Care Provider: Einar CrowAnderson, Marshall Other Clinician: Referring Provider: Einar CrowAnderson, Marshall Treating Provider/Extender: Rowan BlaseStone, Hennie Gosa Weeks in Treatment: 3 Information Obtained from: Patient Chief Complaint Right gluteal fold pressure ulcer Electronic Signature(s) Signed: 07/25/2020 1:53:54 PM By: Lenda KelpStone III, Tyqwan Pink PA-C Entered By: Lenda KelpStone III, Santos Hardwick on 07/25/2020 13:53:54 Lindsey Pollard, Elverna M. ([id number]) -------------------------------------------------------------------------------- HPI Details Patient Name: Lindsey Pollard, Lindsey M. Date of Service: 07/25/2020 1:45 PM Medical Record Number: [id number] Patient Account Number: [id number] Date of Birth/Sex: [birth year] (80 y.o. F) Treating RN: Yevonne PaxEpps, Carrie Primary Care Provider: Einar CrowAnderson, Marshall Other Clinician: Referring Provider: Einar CrowAnderson, Marshall Treating Provider/Extender: Rowan BlaseStone, Rigby Swamy Weeks in Treatment: 3 History of Present Illness HPI Description: 05/06/2020 on evaluation today patient presents for initial inspection here in our clinic concerning an issue that she is having with a wound in the right gluteal fold region. She has been tolerating some dressing changes she has been performing at home mainly she been putting a little bit of ointment on this and keeping it covered. She notes that this has been present for about 2 months and she is noted some improvement compared to where it started but unfortunately not a significant amount to where she is pleased with how things have gone. Nonetheless she did come in to be seen in order to see if there is anything we can do to aid in helping the area to heal more effectively and quickly. Fortunately there is no  signs of active infection at this time. No fevers, chills, nausea, vomiting, or diarrhea. She has diabetic and on 05/04/19 her most recent reading of 7.9. She also has a history of heart disease of note. She also has hypertension. Her blood pressure is elevated today as well. She follows up with her primary care provider on a regular basis in regard to this. 07/17/2019 on evaluation today patient appears to be doing well with regard to her wound at this time. She has been tolerating the dressing changes without complication. Fortunately she is able to do this herself and seems to be doing quite well. There does not appear to be any signs of active infection at this time which is good news. No fevers, chills, nausea, vomiting, or diarrhea. 07/28/2019 on evaluation today patient appears to be doing a little bit worse today compared to previously noted in regard to her wound. Unfortunately she seems to be having slight hypergranulation I am not exactly sure why this is. Fortunately there is no signs of active infection at this time. No fevers, chills, nausea, vomiting, or diarrhea. With that being said it may be that this is more of a moisture issue this is somewhat in a crease in the gluteal fold region unfortunately that is making things a little bit more severe in my opinion. 08/04/2019 upon evaluation today patient appears to be doing excellent in regard to her wound. In fact this is almost completely healed and seems to have made great progress since last week. Fortunately there is no signs of active infection at this time. 08/11/2019 upon evaluation today patient actually appears to be completely healed she is doing excellent at this point. Fortunately there is no signs of active infection which is also great news. I am very pleased with how things are progressing. 06/30/2020 upon evalu somewhat poorly in  regard to the 2 wounds that apparently appeared about a month ago. These appear to likely be abscesses  in nature although to be honest I cannot be for sure where this came from. It definitely does not appear to be pressure to me and other than abscess I really do not see an obvious other etiology at this point. She does have a rash in the inguinal area I think this is probably fungal in nature and I think some nystatin powder could be beneficial for with that being said I do not think that this is the cause of the wounds themselves. Overall today patient appears to be doing Quite well. 07/18/2020 upon evaluation today patient actually appears to be doing excellent in regard to her wounds. In fact both are showing signs of excellent improvement and seem to be doing quite well. I am extremely pleased with where we stand today. 07/25/2020 upon evaluation today patient appears to be doing well with regard to her wounds. In fact one is healed and the other is very small. I do believe that she is making good progress. She is actually going be leaving to go out of town for 2 weeks this coming Saturday. Electronic Signature(s) Signed: 07/25/2020 2:39:05 PM By: Lenda Kelp PA-C Entered By: Lenda Kelp on 07/25/2020 14:39:05 Lindsey Pollard ([id number]) -------------------------------------------------------------------------------- Physical Exam Details Patient Name: Lindsey Pollard Date of Service: 07/25/2020 1:45 PM Medical Record Number: [id number] Patient Account Number: [id number] Date of Birth/Sex: 1940/06/28 (80 y.o. F) Treating RN: Yevonne Pax Primary Care Provider: Einar Crow Other Clinician: Referring Provider: Einar Crow Treating Provider/Extender: Rowan Blase in Treatment: 3 Constitutional Well-nourished and well-hydrated in no acute distress. Respiratory normal breathing without difficulty. Psychiatric this patient is able to make decisions and demonstrates good insight into disease process. Alert and Oriented x 3. pleasant and cooperative. Notes Upon  inspection patient appears to be doing quite well with regard to her wounds I feel like the collagen is doing a great job and other than some of the irritation from the Band-Aid everything else seems to be going quite well. She is leaving to go out of town on Saturday she will be gone for 15 days therefore we can see her roughly in about 3 weeks. Electronic Signature(s) Signed: 07/25/2020 2:39:25 PM By: Lenda Kelp PA-C Entered By: Lenda Kelp on 07/25/2020 14:39:25 Lindsey Pollard ([id number]) -------------------------------------------------------------------------------- Physician Orders Details Patient Name: Lindsey Pollard Date of Service: 07/25/2020 1:45 PM Medical Record Number: [id number] Patient Account Number: [id number] Date of Birth/Sex: 10-12-1940 (80 y.o. 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 Bathing/ Shower/ Hygiene o May shower; gently cleanse wound with antibacterial soap, rinse and pat dry prior to dressing wounds Off-Loading Wound #2 Right Gluteal fold o Turn and reposition every 2 hours o Other: - Keep pressure off of wounded areas. Wound Treatment Wound #2 - Gluteal fold Wound Laterality: Right Cleanser: Normal Saline (Generic) 3 x Per Week/15 Days Discharge Instructions: Wash your hands with soap and  water. Remove old dressing, discard into plastic  bag and place into trash. Cleanse the wound with Normal Saline prior to applying a clean dressing using gauze sponges, not tissues or cotton balls. Do not scrub or use excessive force. Pat dry using gauze sponges, not tissue or cotton balls. Primary Dressing: Prisma 4.34 (in) 3 x Per Week/15 Days Discharge Instructions: Moisten w/normal saline or sterile water; Cover wound as directed. Do not remove from wound bed. Add-Ons: Coverlet Latex-Free Fabric Adhesive Dressings 3 x Per Week/15 Days Discharge Instructions: Elastic Knuckle Bandage Electronic Signature(s) Signed: 07/25/2020 4:15:06 PM By: Lenda Kelp PA-C Signed: 07/27/2020 9:59:20 AM By: Yevonne Pax RN Entered By: Yevonne Pax on 07/25/2020 14:11:04 Lindsey Pollard ([id number]) -------------------------------------------------------------------------------- Problem List Details Patient Name: Lindsey Pollard Date of Service: 07/25/2020 1:45 PM Medical Record Number: [id number] Patient Account Number: [id number] Date of Birth/Sex: 1941-04-19 (80 y.o. F) Treating RN: Yevonne Pax Primary Care Provider: Einar Crow Other Clinician: Referring Provider: Einar Crow Treating Provider/Extender: Rowan Blase in Treatment: 3 Active Problems ICD-10 Encounter Code Description Active Date MDM Diagnosis [phone] Non-pressure chronic ulcer of buttock with fat layer exposed 06/30/2020 No Yes L98.492 Non-pressure chronic ulcer of skin of other sites with fat layer exposed 06/30/2020 No Yes E11.622 Type 2 diabetes mellitus with other skin ulcer 06/30/2020 No Yes I10 Essential (primary) hypertension 06/30/2020 No Yes I25.10 Atherosclerotic heart disease of native coronary artery without angina 06/30/2020 No Yes pectoris Inactive Problems Resolved Problems Electronic Signature(s) Signed: 07/25/2020 1:53:47 PM By: Lenda Kelp PA-C Entered By: Lenda Kelp on 07/25/2020 13:53:47 Lindsey Pollard  ([id number]) -------------------------------------------------------------------------------- Progress Note Details Patient Name: Lindsey Pollard Date of Service: 07/25/2020 1:45 PM Medical Record Number: [id number] Patient Account Number: [id number] Date of Birth/Sex: 10/21/1940 (80 y.o. F) Treating RN: Yevonne Pax Primary Care Provider: Einar Crow Other Clinician: Referring Provider: Einar Crow Treating Provider/Extender: Rowan Blase in Treatment: 3 Subjective Chief Complaint Information obtained from Patient Right gluteal fold pressure ulcer History of Present Illness (HPI) 05/06/2020 on evaluation today patient presents for initial inspection here in our clinic concerning an issue that she is having with a wound in the right gluteal fold region. She has been tolerating some dressing changes she has been performing at home mainly she been putting a little bit of ointment on this and keeping it covered. She notes that this has been present for about 2 months and she is noted some improvement compared to where it started but unfortunately not a significant amount to where she is pleased with how things have gone. Nonetheless she did come in to be seen in order to see if there is anything we can do to aid in helping the area to heal more effectively and quickly. Fortunately there is no signs of active infection at this time. No fevers, chills, nausea, vomiting, or diarrhea. She has diabetic and on 05/04/19 her most recent reading of 7.9. She also has a history of heart disease of note. She also has hypertension. Her blood pressure is elevated today as well. She follows up with her primary care provider on a regular basis in regard to this. 07/17/2019 on evaluation today patient appears to be doing well with regard to her wound at this time. She has been tolerating the dressing changes without complication. Fortunately she is able to do this herself and seems to be doing  quite well. There does not appear to be any signs of active infection at this time which is  good news. No fevers, chills, nausea, vomiting, or diarrhea. 07/28/2019 on evaluation today patient appears to be doing a little bit worse today compared to previously noted in regard to her wound. Unfortunately she seems to be having slight hypergranulation I am not exactly sure why this is. Fortunately there is no signs of active infection at this time. No fevers, chills, nausea, vomiting, or diarrhea. With that being said it may be that this is more of a moisture issue this is somewhat in a crease in the gluteal fold region unfortunately that is making things a little bit more severe in my opinion. 08/04/2019 upon evaluation today patient appears to be doing excellent in regard to her wound. In fact this is almost completely healed and seems to have made great progress since last week. Fortunately there is no signs of active infection at this time. 08/11/2019 upon evaluation today patient actually appears to be completely healed she is doing excellent at this point. Fortunately there is no signs of active infection which is also great news. I am very pleased with how things are progressing. 06/30/2020 upon evalu somewhat poorly in regard to the 2 wounds that apparently appeared about a month ago. These appear to likely be abscesses in nature although to be honest I cannot be for sure where this came from. It definitely does not appear to be pressure to me and other than abscess I really do not see an obvious other etiology at this point. She does have a rash in the inguinal area I think this is probably fungal in nature and I think some nystatin powder could be beneficial for with that being said I do not think that this is the cause of the wounds themselves. Overall today patient appears to be doing Quite well. 07/18/2020 upon evaluation today patient actually appears to be doing excellent in regard to her wounds.  In fact both are showing signs of excellent improvement and seem to be doing quite well. I am extremely pleased with where we stand today. 07/25/2020 upon evaluation today patient appears to be doing well with regard to her wounds. In fact one is healed and the other is very small. I do believe that she is making good progress. She is actually going be leaving to go out of town for 2 weeks this coming Saturday. Objective Constitutional Well-nourished and well-hydrated in no acute distress. Vitals Time Taken: 1:45 PM, Height: 59 in, Weight: 153 lbs, BMI: 30.9, Temperature: 98.0 F, Pulse: 57 bpm, Respiratory Rate: 18 breaths/min, Blood Pressure: 151/62 mmHg. Respiratory normal breathing without difficulty. Psychiatric this patient is able to make decisions and demonstrates good insight into disease process. Alert and Oriented x 3. pleasant and cooperative. LINDORA, ALVIAR ([id number]) General Notes: Upon inspection patient appears to be doing quite well with regard to her wounds I feel like the collagen is doing a great job and other than some of the irritation from the Band-Aid everything else seems to be going quite well. She is leaving to go out of town on Saturday she will be gone for 15 days therefore we can see her roughly in about 3 weeks. Integumentary (Hair, Skin) Wound #2 status is Open. Original cause of wound was Gradually Appeared. The wound is located on the Right Gluteal fold. The wound measures 0.5cm length x 0.4cm width x 0.1cm depth; 0.157cm^2 area and 0.016cm^3 volume. There is Fat Layer (Subcutaneous Tissue) exposed. There is a medium amount of serous drainage noted. The wound margin is flat  and intact. There is large (67-100%) red granulation within the wound bed. There is no necrotic tissue within the wound bed. Wound #3 status is Healed - Epithelialized. Original cause of wound was Gradually Appeared. The wound is located on the Left Labia. The wound measures 0cm length x  0cm width x 0cm depth; 0cm^2 area and 0cm^3 volume. 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 Bathing/ Shower/ Hygiene: May shower; gently cleanse wound with antibacterial soap, rinse and pat dry prior to dressing wounds Off-Loading: Wound #2 Right Gluteal fold: Turn and reposition every 2 hours Other: - Keep pressure off of wounded areas. WOUND #2: - Gluteal fold Wound Laterality: Right Cleanser: Normal Saline (Generic) 3 x Per Week/15 Days Discharge Instructions: Wash your hands with soap and water. Remove old dressing, discard into plastic bag and place into trash. Cleanse the wound with Normal Saline prior to applying a clean dressing using gauze sponges, not tissues or cotton balls. Do not scrub or use excessive force. Pat dry using gauze sponges, not tissue or cotton balls. Primary Dressing: Prisma 4.34 (in) 3 x Per Week/15 Days Discharge Instructions: Moisten w/normal saline or sterile water; Cover wound as directed. Do not remove from wound bed. Add-Ons: Coverlet Latex-Free Fabric Adhesive Dressings 3 x Per Week/15 Days Discharge Instructions: Elastic Knuckle Bandage 1. Would recommend that we continue with the wound care measures as before using silver collagen I think that still the best way to go. 2. I am also can recommend currently that we have the patient go ahead and turn and reposition on a regular basis to offload I think that still of utmost importance. 3. She will use a cover Band-Aid to hold everything in place other than the irritation this is causing she seems to be doing okay hopefully we can get her out of this soon so  she does not have to continue using the Band-Aids which will resolve any of the irritation/inflammation. We will see patient back for reevaluation in 3 weeks here in the clinic. If anything worsens or changes patient will contact our office for additional recommendations. Electronic Signature(s) Signed: 07/25/2020 2:40:00 PM By: Lenda Kelp PA-C Entered By: Lenda Kelp on 07/25/2020 14:40:00 Lindsey Pollard ([id number]) -------------------------------------------------------------------------------- SuperBill Details Patient Name: Lindsey Pollard Date of Service: 07/25/2020 Medical Record Number: [id number] Patient Account Number: [id number] Date of Birth/Sex: [birth year] (80 y.o. 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 E11.622 Type 2 diabetes mellitus with other skin ulcer I10 Essential (primary) hypertension I25.10 Atherosclerotic heart disease of native coronary artery without angina pectoris Facility Procedures CPT4 Code: [id number] Description: [phone] - WOUND CARE VISIT-LEV 2 EST PT Modifier: Quantity: 1 Physician Procedures CPT4 Code: 4492010 Description: 99213 - WC PHYS LEVEL 3 - EST PT Modifier: Quantity: 1 CPT4 Code: Description: ICD-10 Diagnosis Description L98.412 Non-pressure chronic ulcer of buttock with fat layer exposed L98.492 Non-pressure chronic ulcer of skin of other sites with fat layer expos E11.622 Type 2 diabetes  mellitus with other skin ulcer I10  Essential (primary) hypertension Modifier: ed Quantity: Electronic Signature(s) Signed: 07/25/2020 2:40:13 PM By: Lenda Kelp PA-C Entered By: Lenda Kelp on  07/25/2020 14:40:13

## 2020-07-27 NOTE — Progress Notes (Signed)
RILEI, KRAVITZ ([id number]) Visit Report for 07/25/2020 Arrival Information Details Patient Name: Lindsey, Pollard Date of Service: 07/25/2020 1:45 PM Medical Record Number: [id number] Patient Account Number: [id number] Date of Birth/Sex: 08/10/1940 (79 y.o. F) Treating RN: Yevonne Pax Primary Care Lindsey Pollard: Einar Crow Other Clinician: Referring Hondo Nanda: Einar Crow Treating Candia Kingsbury/Extender: Rowan Blase in Treatment: 3 Visit Information History Since Last Visit Added or deleted any medications: No Patient Arrived: Ambulatory Any new allergies or adverse reactions: No Arrival Time: 13:40 Had a fall or experienced change in No Accompanied By: self activities of daily living that may affect Transfer Assistance: None risk of falls: Patient Identification Verified: Yes Signs or symptoms of abuse/neglect since last visito No Secondary Verification Process Completed: Yes Hospitalized since last visit: No Implantable device outside of the clinic excluding No cellular tissue based products placed in the center since last visit: Has Dressing in Place as Prescribed: Yes Pain Present Now: No Electronic Signature(s) Signed: 07/25/2020 4:19:36 PM By: Dayton Martes RCP, RRT, CHT Entered By: Dayton Martes on 07/25/2020 13:43:33 Lindsey Pollard ([id number]) -------------------------------------------------------------------------------- Clinic Level of Care Assessment Details Patient Name: Lindsey Pollard Date of Service: 07/25/2020 1:45 PM Medical Record Number: [id number] Patient Account Number: [id number] Date of Birth/Sex: 10-29-1940 (79 y.o. F) Treating RN: Yevonne Pax Primary Care Queena Monrreal: Einar Crow Other Clinician: Referring Jonathan Corpus: Einar Crow Treating Alzina Golda/Extender: Rowan Blase in Treatment: 3 Clinic Level of Care Assessment Items TOOL 4 Quantity Score X - Use when only an EandM is performed on  FOLLOW-UP visit 1 0 ASSESSMENTS - Nursing Assessment / Reassessment X - Reassessment of Co-morbidities (includes updates in patient status) 1 10 X- 1 5 Reassessment of Adherence to Treatment Plan ASSESSMENTS - Wound and Skin Assessment / Reassessment X - Simple Wound Assessment / Reassessment - one wound 1 5 []  - 0 Complex Wound Assessment / Reassessment - multiple wounds []  - 0 Dermatologic / Skin Assessment (not related to wound area) ASSESSMENTS - Focused Assessment []  - Circumferential Edema Measurements - multi extremities 0 []  - 0 Nutritional Assessment / Counseling / Intervention []  - 0 Lower Extremity Assessment (monofilament, tuning fork, pulses) []  - 0 Peripheral Arterial Disease Assessment (using hand held doppler) ASSESSMENTS - Ostomy and/or Continence Assessment and Care []  - Incontinence Assessment and Management 0 []  - 0 Ostomy Care Assessment and Management (repouching, etc.) PROCESS - Coordination of Care X - Simple Patient / Family Education for ongoing care 1 15 []  - 0 Complex (extensive) Patient / Family Education for ongoing care []  - 0 Staff obtains , Records, Test Results / Process Orders []  - 0 Staff telephones HHA, Nursing Homes / Clarify orders / etc []  - 0 Routine Transfer to another Facility (non-emergent condition) []  - 0 Routine Hospital Admission (non-emergent condition) []  - 0 New Admissions / / Ordering NPWT, Apligraf, etc. []  - 0 Emergency Hospital Admission (emergent condition) []  - 0 Simple Discharge Coordination []  - 0 Complex (extensive) Discharge Coordination PROCESS - Special Needs []  - Pediatric / Minor Patient Management 0 []  - 0 Isolation Patient Management []  - 0 Hearing / Language / Visual special needs []  - 0 Assessment of Community assistance (transportation, D/C planning, etc.) []  - 0 Additional assistance / Altered mentation []  - 0 Support Surface(s) Assessment (bed, cushion, seat,  etc.) INTERVENTIONS - Wound Cleansing / Measurement [name], [name] ( ) X- 1 5 Simple Wound Cleansing - one wound []  - 0 Complex Wound Cleansing - multiple wounds X- 1  5 Wound Imaging (photographs - any number of wounds) []  - 0 Wound Tracing (instead of photographs) X- 1 5 Simple Wound Measurement - one wound []  - 0 Complex Wound Measurement - multiple wounds INTERVENTIONS - Wound Dressings X - Small Wound Dressing one or multiple wounds 1 10 []  - 0 Medium Wound Dressing one or multiple wounds []  - 0 Large Wound Dressing one or multiple wounds X- 1 5 Application of Medications - topical []  - 0 Application of Medications - injection INTERVENTIONS - Miscellaneous []  - External ear exam 0 []  - 0 Specimen Collection (cultures, biopsies, blood, body fluids, etc.) []  - 0 Specimen(s) / Culture(s) sent or taken to Lab for analysis []  - 0 Patient Transfer (multiple staff / / Similar devices) []  - 0 Simple Staple / Suture removal (25 or less) []  - 0 Complex Staple / Suture removal (26 or more) []  - 0 Hypo / Hyperglycemic Management (close monitor of Blood Glucose) []  - 0 Ankle / Brachial Index (ABI) - do not check if billed separately X- 1 5 Vital Signs Has the patient been seen at the hospital within the last three years: Yes Total Score: 70 Level Of Care: New/Established - Level 2 Electronic Signature(s) Signed: 07/27/2020 9:59:20 AM By: RN Entered By: on 07/25/2020 14:11:39 ( ) -------------------------------------------------------------------------------- Encounter Discharge Information Details Patient Name: Date of Service: 07/25/2020 1:45 PM Medical Record Number: Nurse, adult Patient Account Number: Date of Birth/Sex: 29-Jan-1941 (79 y.o. F) Treating RN: Primary Care Cricket Goodlin: 09/24/2020 Other Clinician: Referring Rebbecca Osuna: Yevonne Pax Treating  Arath Kaigler/Extender: Yevonne Pax in Treatment: 3 Encounter Discharge Information Items Discharge Condition: Stable Ambulatory Status: Cane Discharge Destination: Home Transportation: Private Auto Accompanied By: self Schedule Follow-up Appointment: Yes Clinical Summary of Care: Electronic Signature(s) Signed: 07/26/2020 6:03:28 PM By: Lindsey Pollard, BSN, RN, CWS, Kim RN, BSN Entered By: [id number], BSN, RN, CWS, Kim on 07/25/2020 14:42:56 07/27/2020 ([id number]) -------------------------------------------------------------------------------- Lower Extremity Assessment Details Patient Name: [id number] Date of Service: 07/25/2020 1:45 PM Medical Record Number: [id number] Patient Account Number: Huel Coventry Date of Birth/Sex: 28-Aug-1940 (79 y.o. F) Treating RN: Rowan Blase Primary Care Griselda Bramblett: 09/23/2020 Other Clinician: Referring Erynne Kealey: Elliot Gurney Treating Zayveon Raschke/Extender: Elliot Gurney in Treatment: 3 Electronic Signature(s) Signed: 07/26/2020 6:03:28 PM By: Lindsey Pollard, BSN, RN, CWS, Kim RN, BSN Entered By: [id number], BSN, RN, CWS, Kim on 07/25/2020 14:01:14 07/27/2020 ([id number]) -------------------------------------------------------------------------------- Multi Wound Chart Details Patient Name: [id number] Date of Service: 07/25/2020 1:45 PM Medical Record Number: [id number] Patient Account Number: Huel Coventry Date of Birth/Sex: 1940/09/29 (79 y.o. F) Treating RN: Rowan Blase Primary Care Yerlin Gasparyan: 09/23/2020 Other Clinician: Referring Randen Kauth: Elliot Gurney Treating Vendela Troung/Extender: Elliot Gurney in Treatment: 3 Vital Signs Height(in): 59 Pulse(bpm): 57 Weight(lbs): 153 Blood Pressure(mmHg): 151/62 Body Mass Index(BMI): 31 Temperature(F): 98.0 Respiratory Rate(breaths/min): 18 Photos: [N/A:N/A] Wound Location: Right Gluteal fold Left Labia N/A Wounding Event: Gradually Appeared Gradually Appeared N/A Primary Etiology:  Abscess MASD N/A Comorbid History: Cataracts, Hypertension, Myocardial N/A N/A Infarction, Type II Diabetes, History of pressure wounds, Osteoarthritis Date Acquired: 05/30/2020 05/30/2020 N/A Pollard of Treatment: 3 3 N/A Wound Status: Open Healed - Epithelialized N/A Measurements L x W x D (cm) 0.5x0.4x0.1 0x0x0 N/A Area (cm) : 0.157 0 N/A Volume (cm) : 0.016 0 N/A % Reduction in Area: 86.70% 100.00% N/A % Reduction in Volume: 93.20% 100.00% N/A Classification: Full Thickness Without Exposed Full Thickness Without Exposed N/A  Support Structures Support Structures Exudate Amount: Medium N/A N/A Exudate Type: Serous N/A N/A Exudate Color: amber N/A N/A Wound Margin: Flat and Intact N/A N/A Granulation Amount: Large (67-100%) N/A N/A Granulation Quality: Red N/A N/A Necrotic Amount: None Present (0%) N/A N/A Exposed Structures: Fat Layer (Subcutaneous Tissue): N/A N/A Yes Fascia: No Tendon: No Muscle: No Joint: No Bone: No Epithelialization: None N/A N/A Treatment Notes Electronic Signature(s) Signed: 07/27/2020 9:59:20 AM By: Yevonne Pax RN Entered By: Yevonne Pax on 07/25/2020 14:10:21 Lindsey Pollard ([id number]) -------------------------------------------------------------------------------- Multi-Disciplinary Care Plan Details Patient Name: Lindsey Pollard Date of Service: 07/25/2020 1:45 PM Medical Record Number: [id number] Patient Account Number: [id number] Date of Birth/Sex: 09-19-1940 (79 y.o. F) Treating RN: Yevonne Pax Primary Care Crystalina Stodghill: Einar Crow Other Clinician: Referring Marcell Pfeifer: Einar Crow Treating Kiosha Buchan/Extender: Rowan Blase in Treatment: 3 Active Inactive Abuse / Safety / Falls / Self Care Management Nursing Diagnoses: History of Falls Goals: Patient will remain injury free related to falls Date Initiated: 07/18/2020 Target Resolution Date: 08/08/2020 Goal Status: Active Interventions: Provide education on fall  prevention Provide education on personal and home safety Notes: Orientation to the Wound Care Program Nursing Diagnoses: Knowledge deficit related to the wound healing center program Goals: Patient/caregiver will verbalize understanding of the Wound Healing Center Program Date Initiated: 06/30/2020 Target Resolution Date: 06/30/2020 Goal Status: Active Interventions: Provide education on orientation to the wound center Notes: Wound/Skin Impairment Nursing Diagnoses: Impaired tissue integrity Goals: Patient/caregiver will verbalize understanding of skin care regimen Date Initiated: 06/30/2020 Target Resolution Date: 06/30/2020 Goal Status: Active Ulcer/skin breakdown will have a volume reduction of 30% by week 4 Date Initiated: 06/30/2020 Target Resolution Date: 07/29/2020 Goal Status: Active Ulcer/skin breakdown will have a volume reduction of 50% by week 8 Date Initiated: 06/30/2020 Target Resolution Date: 08/26/2020 Goal Status: Active Ulcer/skin breakdown will have a volume reduction of 80% by week 12 Date Initiated: 06/30/2020 Target Resolution Date: 09/26/2020 Goal Status: Active Ulcer/skin breakdown will heal within 14 Pollard Date Initiated: 06/30/2020 Target Resolution Date: 10/10/2020 Goal Status: Active Interventions: Lindsey, Pollard ([id number]) Assess ulceration(s) every visit Treatment Activities: Skin care regimen initiated : 06/30/2020 Notes: Electronic Signature(s) Signed: 07/27/2020 9:59:20 AM By: Yevonne Pax RN Entered By: Yevonne Pax on 07/25/2020 14:10:07 Lindsey Pollard ([id number]) -------------------------------------------------------------------------------- Pain Assessment Details Patient Name: Lindsey Pollard Date of Service: 07/25/2020 1:45 PM Medical Record Number: [id number] Patient Account Number: [id number] Date of Birth/Sex: 11-17-1940 (79 y.o. F) Treating RN: Huel Coventry Primary Care Jamont Mellin: Einar Crow Other Clinician: Referring Samit Sylve: Einar Crow Treating Jamie Hafford/Extender: Rowan Blase in Treatment: 3 Active Problems Location of Pain Severity and Description of Pain Patient Has Paino No Site Locations Pain Management and Medication Current Pain Management: Notes PAtient denies pain at this time. Electronic Signature(s) Signed: 07/26/2020 6:03:28 PM By: Elliot Gurney, BSN, RN, CWS, Kim RN, BSN Entered By: Elliot Gurney, BSN, RN, CWS, Kim on 07/25/2020 13:54:38 Lindsey Pollard ([id number]) -------------------------------------------------------------------------------- Patient/Caregiver Education Details Patient Name: Lindsey Pollard Date of Service: 07/25/2020 1:45 PM Medical Record Number: [id number] Patient Account Number: [id number] Date of Birth/Gender: [birth year] (79 y.o. F) Treating RN: Yevonne Pax Primary Care Physician: Einar Crow Other Clinician: Referring Physician: Einar Crow Treating Physician/Extender: Rowan Blase in Treatment: 3 Education Assessment Education Provided To: Patient Education Topics Provided Safety: Methods: Explain/Verbal Responses: State content correctly Electronic Signature(s) Signed: 07/27/2020 9:59:20 AM By: Yevonne Pax RN Entered By: Yevonne Pax on 07/25/2020 14:11:58 Lindsey Pollard ([id number]) -------------------------------------------------------------------------------- Wound Assessment Details Patient Name: Lindsey Kiel  M. Date of Service: 07/25/2020 1:45 PM Medical Record Number: [id number] Patient Account Number: [id number] Date of Birth/Sex: 11/08/1940 (79 y.o. F) Treating RN: Huel CoventryWoody, Kim Primary Care Tyreanna Bisesi: Einar CrowAnderson, Marshall Other Clinician: Referring Cathyrn Deas: Einar CrowAnderson, Marshall Treating Cyris Maalouf/Extender: Rowan BlaseStone, Lindsey Pollard in Treatment: 3 Wound Status Wound Number: 2 Primary Abscess Etiology: Wound Location: Right Gluteal fold Wound Open Wounding Event: Gradually Appeared Status: Date Acquired: 05/30/2020 Comorbid Cataracts, Hypertension,  Myocardial Infarction, Type II Pollard Of Treatment: 3 History: Diabetes, History of pressure wounds, Osteoarthritis Clustered Wound: No Photos Wound Measurements Length: (cm) 0.5 Width: (cm) 0.4 Depth: (cm) 0.1 Area: (cm) 0.157 Volume: (cm) 0.016 % Reduction in Area: 86.7% % Reduction in Volume: 93.2% Epithelialization: None Wound Description Classification: Full Thickness Without Exposed Support Structures Wound Margin: Flat and Intact Exudate Amount: Medium Exudate Type: Serous Exudate Color: amber Foul Odor After Cleansing: No Slough/Fibrino No Wound Bed Granulation Amount: Large (67-100%) Exposed Structure Granulation Quality: Red Fascia Exposed: No Necrotic Amount: None Present (0%) Fat Layer (Subcutaneous Tissue) Exposed: Yes Tendon Exposed: No Muscle Exposed: No Joint Exposed: No Bone Exposed: No Treatment Notes Wound #2 (Gluteal fold) Wound Laterality: Right Cleanser Normal Saline Discharge Instruction: Wash your hands with soap and water. Remove old dressing, discard into plastic bag and place into trash. Cleanse the wound with Normal Saline prior to applying a clean dressing using gauze sponges, not tissues or cotton balls. Do not scrub or use excessive force. Pat dry using gauze sponges, not tissue or cotton balls. Lindsey GrieveFLYNN, Lindsey M. ([id number]) Peri-Wound Care Topical Primary Dressing Prisma 4.34 (in) Discharge Instruction: Moisten w/normal saline or sterile water; Cover wound as directed. Do not remove from wound bed. Secondary Dressing Secured With Compression Wrap Compression Stockings Add-Ons Coverlet Latex-Free Fabric Adhesive Dressings Discharge Instruction: Nurse, learning disabilitylastic Knuckle Bandage Electronic Signature(s) Signed: 07/26/2020 6:03:28 PM By: Elliot GurneyWoody, BSN, RN, CWS, Kim RN, BSN Entered By: Elliot GurneyWoody, BSN, RN, CWS, Kim on 07/25/2020 14:00:30 Lindsey GrieveFLYNN, Lindsey M. ([id number]) -------------------------------------------------------------------------------- Wound  Assessment Details Patient Name: Lindsey GrieveFLYNN, Lindsey M. Date of Service: 07/25/2020 1:45 PM Medical Record Number: [id number] Patient Account Number: [id number] Date of Birth/Sex: 11/08/1940 (79 y.o. F) Treating RN: Huel CoventryWoody, Kim Primary Care Henery Betzold: Einar CrowAnderson, Marshall Other Clinician: Referring Makaylynn Bonillas: Einar CrowAnderson, Marshall Treating Dannah Ryles/Extender: Allen DerryStone, Lindsey Pollard in Treatment: 3 Wound Status Wound Number: 3 Primary Etiology: MASD Wound Location: Left Labia Wound Status: Healed - Epithelialized Wounding Event: Gradually Appeared Date Acquired: 05/30/2020 Pollard Of Treatment: 3 Clustered Wound: No Photos Photo Uploaded By: Elliot GurneyWoody, BSN, RN, CWS, Kim on 07/25/2020 14:02:45 Wound Measurements Length: (cm) Width: (cm) Depth: (cm) Area: (cm) Volume: (cm) 0 % Reduction in Area: 100% 0 % Reduction in Volume: 100% 0 0 0 Wound Description Classification: Full Thickness Without Exposed Support Structu res Treatment Notes Wound #3 (Labia) Wound Laterality: Left Cleanser Peri-Wound Care Topical Primary Dressing Secondary Dressing Secured With Compression Wrap Compression Stockings Add-Ons Electronic Signature(s) Signed: 07/26/2020 6:03:28 PM By: Elliot GurneyWoody, BSN, RN, CWS, Kim RN, BSN Rampersad, Lindsey CrucisNANCY M. ([id number]) Entered By: Elliot GurneyWoody, BSN, RN, CWS, Kim on 07/25/2020 14:01:05 Lindsey GrieveFLYNN, Lindsey M. ([id number]) -------------------------------------------------------------------------------- Vitals Details Patient Name: Lindsey GrieveFLYNN, Lindsey M. Date of Service: 07/25/2020 1:45 PM Medical Record Number: [id number] Patient Account Number: [id number] Date of Birth/Sex: 11/08/1940 (79 y.o. F) Treating RN: Yevonne PaxEpps, Carrie Primary Care Marshawn Normoyle: Einar CrowAnderson, Marshall Other Clinician: Referring Prachi Oftedahl: Einar CrowAnderson, Marshall Treating Janeth Terry/Extender: Rowan BlaseStone, Lindsey Pollard in Treatment: 3 Vital Signs Time Taken: 13:45 Temperature (F): 98.0 Height (in): 59 Pulse (bpm): 57 Weight (lbs): 153 Respiratory Rate  (breaths/min): 18 Body Mass Index (BMI):  30.9 Blood Pressure (mmHg): 151/62 Reference Range: 80 - 120 mg / dl Electronic Signature(s) Signed: 07/25/2020 4:19:36 PM By: Dayton Martes RCP, RRT, CHT Entered By: Dayton Martes on 07/25/2020 13:46:26

## 2020-08-15 ENCOUNTER — Encounter (INDEPENDENT_AMBULATORY_CARE_PROVIDER_SITE_OTHER): Payer: Medicare Other | Admitting: Vascular Surgery

## 2020-08-15 ENCOUNTER — Encounter (INDEPENDENT_AMBULATORY_CARE_PROVIDER_SITE_OTHER): Payer: Self-pay

## 2020-08-15 ENCOUNTER — Encounter (INDEPENDENT_AMBULATORY_CARE_PROVIDER_SITE_OTHER): Payer: Medicare Other

## 2020-08-16 ENCOUNTER — Encounter: Payer: Medicare Other | Attending: Physician Assistant | Admitting: Physician Assistant

## 2020-08-16 ENCOUNTER — Other Ambulatory Visit: Payer: Self-pay

## 2020-08-16 DIAGNOSIS — L98492 Non-pressure chronic ulcer of skin of other sites with fat layer exposed: Secondary | ICD-10-CM | POA: Diagnosis not present

## 2020-08-16 DIAGNOSIS — L98412 Non-pressure chronic ulcer of buttock with fat layer exposed: Secondary | ICD-10-CM | POA: Diagnosis not present

## 2020-08-16 DIAGNOSIS — E11622 Type 2 diabetes mellitus with other skin ulcer: Secondary | ICD-10-CM | POA: Diagnosis not present

## 2020-08-16 DIAGNOSIS — E1151 Type 2 diabetes mellitus with diabetic peripheral angiopathy without gangrene: Secondary | ICD-10-CM | POA: Insufficient documentation

## 2020-08-16 NOTE — Progress Notes (Addendum)
Lindsey Pollard ([id number]) Visit Report for 08/16/2020 Chief Complaint Document Details Patient Name: Lindsey Pollard, Lindsey Pollard Date of Service: 08/16/2020 12:45 PM Medical Record Number: [id number] Patient Account Number: [id number] Date of Birth/Sex: [birth year] (80 y.o. F) Treating RN: Rogers Blocker Primary Care Provider: Einar Crow Other Clinician: Referring Provider: Einar Crow Treating Provider/Extender: Rowan Blase in Treatment: 6 Information Obtained from: Patient Chief Complaint Right gluteal fold pressure ulcer Electronic Signature(s) Signed: 08/16/2020 12:58:22 PM By: Lenda Kelp PA-C Entered By: Lenda Kelp on 08/16/2020 12:58:22 Lindsey Pollard ([id number]) -------------------------------------------------------------------------------- HPI Details Patient Name: Lindsey Pollard Date of Service: 08/16/2020 12:45 PM Medical Record Number: [id number] Patient Account Number: [id number] Date of Birth/Sex: 24-Apr-1941 (79 y.o. F) Treating RN: Rogers Blocker Primary Care Provider: Einar Crow Other Clinician: Referring Provider: Einar Crow Treating Provider/Extender: Rowan Blase in Treatment: 6 History of Present Illness HPI Description: 05/06/2020 on evaluation today patient presents for initial inspection here in our clinic concerning an issue that she is having with a wound in the right gluteal fold region. She has been tolerating some dressing changes she has been performing at home mainly she been putting a little bit of ointment on this and keeping it covered. She notes that this has been present for about 2 months and she is noted some improvement compared to where it started but unfortunately not a significant amount to where she is pleased with how things have gone. Nonetheless she did come in to be seen in order to see if there is anything we can do to aid in helping the area to heal more effectively and quickly. Fortunately there is  no signs of active infection at this time. No fevers, chills, nausea, vomiting, or diarrhea. She has diabetic and on 05/04/19 her most recent reading of 7.9. She also has a history of heart disease of note. She also has hypertension. Her blood pressure is elevated today as well. She follows up with her primary care provider on a regular basis in regard to this. 07/17/2019 on evaluation today patient appears to be doing well with regard to her wound at this time. She has been tolerating the dressing changes without complication. Fortunately she is able to do this herself and seems to be doing quite well. There does not appear to be any signs of active infection at this time which is good news. No fevers, chills, nausea, vomiting, or diarrhea. 07/28/2019 on evaluation today patient appears to be doing a little bit worse today compared to previously noted in regard to her wound. Unfortunately she seems to be having slight hypergranulation I am not exactly sure why this is. Fortunately there is no signs of active infection at this time. No fevers, chills, nausea, vomiting, or diarrhea. With that being said it may be that this is more of a moisture issue this is somewhat in a crease in the gluteal fold region unfortunately that is making things a little bit more severe in my opinion. 08/04/2019 upon evaluation today patient appears to be doing excellent in regard to her wound. In fact this is almost completely healed and seems to have made great progress since last week. Fortunately there is no signs of active infection at this time. 08/11/2019 upon evaluation today patient actually appears to be completely healed she is doing excellent at this point. Fortunately there is no signs of active infection which is also great news. I am very pleased with how things are progressing. 06/30/2020 upon evalu somewhat poorly in  regard to the 2 wounds that apparently appeared about a month ago. These appear to likely  be abscesses in nature although to be honest I cannot be for sure where this came from. It definitely does not appear to be pressure to me and other than abscess I really do not see an obvious other etiology at this point. She does have a rash in the inguinal area I think this is probably fungal in nature and I think some nystatin powder could be beneficial for with that being said I do not think that this is the cause of the wounds themselves. Overall today patient appears to be doing Quite well. 07/18/2020 upon evaluation today patient actually appears to be doing excellent in regard to her wounds. In fact both are showing signs of excellent improvement and seem to be doing quite well. I am extremely pleased with where we stand today. 07/25/2020 upon evaluation today patient appears to be doing well with regard to her wounds. In fact one is healed and the other is very small. I do believe that she is making good progress. She is actually going be leaving to go out of town for 2 weeks this coming Saturday. 08/16/2020 upon evaluation today patient appears to be doing well with regard to her wound currently. She has been tolerating the dressing changes and in fact this appears to be completely healed the only thing I see is some irritation from the bandaging other than that overall very pleased. Electronic Signature(s) Signed: 08/16/2020 1:02:21 PM By: Lenda Kelp PA-C Entered By: Lenda Kelp on 08/16/2020 13:02:21 Lindsey Pollard, Lindsey Pollard ([id number]) -------------------------------------------------------------------------------- Physical Exam Details Patient Name: Lindsey Pollard Date of Service: 08/16/2020 12:45 PM Medical Record Number: [id number] Patient Account Number: [id number] Date of Birth/Sex: 1940-09-19 (79 y.o. F) Treating RN: Rogers Blocker Primary Care Provider: Einar Crow Other Clinician: Referring Provider: Einar Crow Treating Provider/Extender: Rowan Blase in  Treatment: 6 Constitutional Well-nourished and well-hydrated in no acute distress. Respiratory normal breathing without difficulty. Psychiatric this patient is able to make decisions and demonstrates good insight into disease process. Alert and Oriented x 3. pleasant and cooperative. Notes Patient's wound bed actually showed signs of good granulation epithelization at this point. There does not appear to be any evidence of active infection which is great news and overall very pleased with where things stand today. Electronic Signature(s) Signed: 08/16/2020 1:02:36 PM By: Lenda Kelp PA-C Entered By: Lenda Kelp on 08/16/2020 13:02:35 Lindsey Pollard ([id number]) -------------------------------------------------------------------------------- Physician Orders Details Patient Name: Lindsey Pollard Date of Service: 08/16/2020 12:45 PM Medical Record Number: [id number] Patient Account Number: [id number] Date of Birth/Sex: [birth year] (79 y.o. 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 E11.622 Type 2 diabetes mellitus with other skin ulcer I10 Essential (primary) hypertension I25.10 Atherosclerotic heart disease of native coronary artery without angina pectoris Discharge From [facility] Services o Discharge from Wound Care Center Treatment Complete Electronic Signature(s) Signed: 08/16/2020 5:02:46 PM By: Lajean Manes RN Signed: 08/18/2020 5:47:45 PM By: Lenda Kelp PA-C Entered By: Phillis Haggis, Dondra Prader on 08/16/2020 13:02:25 Lindsey Pollard  ([id number]) -------------------------------------------------------------------------------- Problem List Details Patient Name: Lindsey Pollard Date of Service: 08/16/2020 12:45 PM  Medical Record Number: [id number] Patient Account Number: [id number] Date of Birth/Sex: 1941-04-07 14(79 y.o. F) Treating RN: Rogers BlockerSanchez, Kenia Primary Care Provider: Einar CrowAnderson, Marshall Other Clinician: Referring Provider: Einar CrowAnderson, Marshall Treating Provider/Extender: Rowan BlaseStone, Brinley Treanor Weeks in Treatment: 6 Active Problems ICD-10 Encounter Code Description Active Date MDM Diagnosis [id number] Non-pressure chronic ulcer of buttock with fat layer exposed 06/30/2020 No Yes L98.492 Non-pressure chronic ulcer of skin of other sites with fat layer exposed 06/30/2020 No Yes E11.622 Type 2 diabetes mellitus with other skin ulcer 06/30/2020 No Yes I10 Essential (primary) hypertension 06/30/2020 No Yes I25.10 Atherosclerotic heart disease of native coronary artery without angina 06/30/2020 No Yes pectoris Inactive Problems Resolved Problems Electronic Signature(s) Signed: 08/16/2020 12:58:13 PM By: Lenda KelpStone III, Maebell Lyvers PA-C Entered By: Lenda KelpStone III, Nakyla Bracco on 08/16/2020 12:58:13 Lindsey Pollard, Lindsey M. ([id number]) -------------------------------------------------------------------------------- Progress Note Details Patient Name: Lindsey Pollard, Cai M. Date of Service: 08/16/2020 12:45 PM Medical Record Number: [id number] Patient Account Number: [id number] Date of Birth/Sex: 1941-04-07 (79 y.o. F) Treating RN: Rogers BlockerSanchez, Kenia Primary Care Provider: Einar CrowAnderson, Marshall Other Clinician: Referring Provider: Einar CrowAnderson, Marshall Treating Provider/Extender: Rowan BlaseStone, Mal Asher Weeks in Treatment: 6 Subjective Chief Complaint Information obtained from Patient Right gluteal fold pressure ulcer History of Present Illness (HPI) 05/06/2020 on evaluation today patient presents for initial inspection here in our clinic concerning an issue that she is having with a wound in  the right gluteal fold region. She has been tolerating some dressing changes she has been performing at home mainly she been putting a little bit of ointment on this and keeping it covered. She notes that this has been present for about 2 months and she is noted some improvement compared to where it started but unfortunately not a significant amount to where she is pleased with how things have gone. Nonetheless she did come in to be seen in order to see if there is anything we can do to aid in helping the area to heal more effectively and quickly. Fortunately there is no signs of active infection at this time. No fevers, chills, nausea, vomiting, or diarrhea. She has diabetic and on 05/04/19 her most recent reading of 7.9. She also has a history of heart disease of note. She also has hypertension. Her blood pressure is elevated today as well. She follows up with her primary care provider on a regular basis in regard to this. 07/17/2019 on evaluation today patient appears to be doing well with regard to her wound at this time. She has been tolerating the dressing changes without complication. Fortunately she is able to do this herself and seems to be doing quite well. There does not appear to be any signs of active infection at this time which is good news. No fevers, chills, nausea, vomiting, or diarrhea. 07/28/2019 on evaluation today patient appears to be doing a little bit worse today compared to previously noted in regard to her wound. Unfortunately she seems to be having slight hypergranulation I am not exactly sure why this is. Fortunately there is no signs of active infection at this time. No fevers, chills, nausea, vomiting, or diarrhea. With that being said it may be that this is more of a moisture issue this is somewhat in a crease in the gluteal fold region unfortunately that is making things a little bit more severe in my opinion. 08/04/2019 upon evaluation today patient appears to be doing  excellent in regard to her wound. In fact this is almost completely healed and seems to have made great progress since last week. Fortunately there  is no signs of active infection at this time. 08/11/2019 upon evaluation today patient actually appears to be completely healed she is doing excellent at this point. Fortunately there is no signs of active infection which is also great news. I am very pleased with how things are progressing. 06/30/2020 upon evalu somewhat poorly in regard to the 2 wounds that apparently appeared about a month ago. These appear to likely be abscesses in nature although to be honest I cannot be for sure where this came from. It definitely does not appear to be pressure to me and other than abscess I really do not see an obvious other etiology at this point. She does have a rash in the inguinal area I think this is probably fungal in nature and I think some nystatin powder could be beneficial for with that being said I do not think that this is the cause of the wounds themselves. Overall today patient appears to be doing Quite well. 07/18/2020 upon evaluation today patient actually appears to be doing excellent in regard to her wounds. In fact both are showing signs of excellent improvement and seem to be doing quite well. I am extremely pleased with where we stand today. 07/25/2020 upon evaluation today patient appears to be doing well with regard to her wounds. In fact one is healed and the other is very small. I do believe that she is making good progress. She is actually going be leaving to go out of town for 2 weeks this coming Saturday. 08/16/2020 upon evaluation today patient appears to be doing well with regard to her wound currently. She has been tolerating the dressing changes and in fact this appears to be completely healed the only thing I see is some irritation from the bandaging other than that overall very pleased. Objective Constitutional Well-nourished and  well-hydrated in no acute distress. Vitals Time Taken: 12:56 PM, Height: 59 in, Weight: 153 lbs, BMI: 30.9, Temperature: 97.6 F, Pulse: 52 bpm, Respiratory Rate: 18 breaths/min, Blood Pressure: 167/65 mmHg. Respiratory Lindsey Pollard, Lindsey Pollard. ([id number]) normal breathing without difficulty. Psychiatric this patient is able to make decisions and demonstrates good insight into disease process. Alert and Oriented x 3. pleasant and cooperative. General Notes: Patient's wound bed actually showed signs of good granulation epithelization at this point. There does not appear to be any evidence of active infection which is great news and overall very pleased with where things stand today. Integumentary (Hair, Skin) Wound #2 status is Open. Original cause of wound was Gradually Appeared. The date acquired was: 05/30/2020. The wound has been in treatment 6 weeks. The wound is located on the Right Gluteal fold. The wound measures 0cm length x 0cm width x 0cm depth; 0cm^2 area and 0cm^3 volume. There is no tunneling or undermining noted. There is a none present amount of drainage noted. The wound margin is flat and intact. There is no granulation within the wound bed. There is no necrotic tissue within the wound bed. 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 Would recommend currently that the patient go ahead and continue to monitor for any signs of opening or worsening but right now everything appears to be doing quite well. I think  she is ready for discharge. 2. I would recommend some AandE ointment just to protect the skin and again help  with some of the irritation from the bandaging and she can apply this 1-2 times a day. We will see her back for follow-up visit as needed if anything changes or worsens. Electronic Signature(s) Signed: 08/16/2020 1:03:05 PM By: Lenda Kelp PA-C Entered By: Lenda Kelp on 08/16/2020 13:03:05 Lindsey Pollard ([id number]) -------------------------------------------------------------------------------- SuperBill Details Patient Name: Lindsey Pollard Date of Service: 08/16/2020 Medical Record Number: [id number] Patient Account Number: [id number] Date of Birth/Sex: [birth year] (79 y.o. 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 E11.622 Type 2 diabetes mellitus with other skin ulcer I10 Essential (primary) hypertension I25.10 Atherosclerotic heart disease of native coronary artery without angina pectoris Facility Procedures CPT4 Code: [id number] Description: [phone] - WOUND CARE VISIT-LEV 2 EST PT Modifier: Quantity: 1 Physician Procedures CPT4 Code: [id number] Description: 99213 - WC PHYS LEVEL 3 - EST PT Modifier: Quantity: 1 CPT4 Code: Description: ICD-10 Diagnosis Description L98.412 Non-pressure chronic ulcer of buttock with fat layer exposed L98.492 Non-pressure chronic ulcer of skin of other sites with fat layer expos E11.622 Type 2 diabetes mellitus with other skin ulcer I10  Essential (primary) hypertension Modifier: ed Quantity: Electronic Signature(s) Signed: 08/16/2020 1:03:31 PM By: Lenda Kelp PA-C Entered By: Lenda Kelp on 08/16/2020 13:03:30

## 2020-08-18 NOTE — Progress Notes (Signed)
DOLOROS, KWOLEK ([id number]) Visit Report for 08/16/2020 Arrival Information Details Patient Name: Lindsey Pollard, Lindsey Pollard Date of Service: 08/16/2020 12:45 PM Medical Record Number: [id number] Patient Account Number: [id number] Date of Birth/Sex: December 24, 1940 (80 y.o. F) Treating RN: Yevonne Pax Primary Care Provider: Einar Crow Other Clinician: Referring Provider: Einar Crow Treating Provider/Extender: Rowan Blase in Treatment: 6 Visit Information History Since Last Visit All ordered tests and consults were completed: No Patient Arrived: Ambulatory Added or deleted any medications: No Arrival Time: 12:47 Any new allergies or adverse reactions: No Accompanied By: self Had a fall or experienced change in No Transfer Assistance: None activities of daily living that may affect Patient Identification Verified: Yes risk of falls: Secondary Verification Process Completed: Yes Signs or symptoms of abuse/neglect since last visito No Patient Requires Transmission-Based Precautions: No Hospitalized since last visit: No Patient Has Alerts: No Implantable device outside of the clinic excluding No cellular tissue based products placed in the center since last visit: Has Dressing in Place as Prescribed: Yes Pain Present Now: No Electronic Signature(s) Signed: 08/18/2020 11:27:08 AM By: Yevonne Pax RN Entered By: Yevonne Pax on 08/16/2020 12:48:07 Lindsey Pollard ([id number]) -------------------------------------------------------------------------------- Clinic Level of Care Assessment Details Patient Name: Lindsey Pollard Date of Service: 08/16/2020 12:45 PM Medical Record Number: [id number] Patient Account Number: [id number] Date of Birth/Sex: Oct 10, 1940 (80 y.o. F) Treating RN: Rogers Blocker Primary Care Provider: Einar Crow Other Clinician: Referring Provider: Einar Crow Treating Provider/Extender: Rowan Blase in Treatment: 6 Clinic Level of Care  Assessment Items TOOL 4 Quantity Score X - Use when only an EandM is performed on FOLLOW-UP visit 1 0 ASSESSMENTS - Nursing Assessment / Reassessment X - Reassessment of Co-morbidities (includes updates in patient status) 1 10 X- 1 5 Reassessment of Adherence to Treatment Plan ASSESSMENTS - Wound and Skin Assessment / Reassessment []  - Simple Wound Assessment / Reassessment - one wound 0 []  - 0 Complex Wound Assessment / Reassessment - multiple wounds []  - 0 Dermatologic / Skin Assessment (not related to wound area) ASSESSMENTS - Focused Assessment []  - Circumferential Edema Measurements - multi extremities 0 []  - 0 Nutritional Assessment / Counseling / Intervention []  - 0 Lower Extremity Assessment (monofilament, tuning fork, pulses) []  - 0 Peripheral Arterial Disease Assessment (using hand held doppler) ASSESSMENTS - Ostomy and/or Continence Assessment and Care []  - Incontinence Assessment and Management 0 []  - 0 Ostomy Care Assessment and Management (repouching, etc.) PROCESS - Coordination of Care X - Simple Patient / Family Education for ongoing care 1 15 []  - 0 Complex (extensive) Patient / Family Education for ongoing care []  - 0 Staff obtains , Records, Test Results / Process Orders []  - 0 Staff telephones HHA, Nursing Homes / Clarify orders / etc []  - 0 Routine Transfer to another Facility (non-emergent condition) []  - 0 Routine Hospital Admission (non-emergent condition) []  - 0 New Admissions / / Ordering NPWT, Apligraf, etc. []  - 0 Emergency Hospital Admission (emergent condition) X- 1 10 Simple Discharge Coordination []  - 0 Complex (extensive) Discharge Coordination PROCESS - Special Needs []  - Pediatric / Minor Patient Management 0 []  - 0 Isolation Patient Management []  - 0 Hearing / Language / Visual special needs []  - 0 Assessment of Community assistance (transportation, D/C planning, etc.) []  - 0 Additional  assistance / Altered mentation []  - 0 Support Surface(s) Assessment (bed, cushion, seat, etc.) INTERVENTIONS - Wound Cleansing / Measurement Mergenthaler, Brylei M. ( ) []  - 0 Simple Wound Cleansing - one  wound []  - 0 Complex Wound Cleansing - multiple wounds []  - 0 Wound Imaging (photographs - any number of wounds) []  - 0 Wound Tracing (instead of photographs) []  - 0 Simple Wound Measurement - one wound []  - 0 Complex Wound Measurement - multiple wounds INTERVENTIONS - Wound Dressings []  - Small Wound Dressing one or multiple wounds 0 []  - 0 Medium Wound Dressing one or multiple wounds []  - 0 Large Wound Dressing one or multiple wounds []  - 0 Application of Medications - topical []  - 0 Application of Medications - injection INTERVENTIONS - Miscellaneous []  - External ear exam 0 []  - 0 Specimen Collection (cultures, biopsies, blood, body fluids, etc.) []  - 0 Specimen(s) / Culture(s) sent or taken to Lab for analysis []  - 0 Patient Transfer (multiple staff / / Similar devices) []  - 0 Simple Staple / Suture removal (25 or less) []  - 0 Complex Staple / Suture removal (26 or more) []  - 0 Hypo / Hyperglycemic Management (close monitor of Blood Glucose) []  - 0 Ankle / Brachial Index (ABI) - do not check if billed separately X- 1 5 Vital Signs Has the patient been seen at the hospital within the last three years: Yes Total Score: 45 Level Of Care: New/Established - Level 2 Electronic Signature(s) Signed: 08/16/2020 5:02:46 PM By: , RN Entered By: , Kenia on 08/16/2020 13:03:03 ( ) -------------------------------------------------------------------------------- Encounter Discharge Information Details Patient Name: Date of Service: 08/16/2020 12:45 PM Medical Record Number: Patient Account Number: Date of Birth/Sex: 01-Mar-1941 (80 y.o. F) Treating RN: Nurse, adult Primary Care Provider: Other Clinician: Referring Provider: Treating Provider/Extender: in Treatment: 6 Encounter Discharge Information Items Discharge Condition: Stable Ambulatory Status: Cane Discharge Destination: Home Transportation: Private Auto Accompanied By: self Schedule Follow-up Appointment: No Clinical Summary of Care: Electronic Signature(s) Signed: 08/16/2020 5:02:46 PM By: 08/18/2020, Phillis Haggis RN Entered By: Dondra Prader, Phillis Haggis on 08/16/2020 13:04:09 Lindsey Pollard ([id number]) -------------------------------------------------------------------------------- Lower Extremity Assessment Details Patient Name: Lindsey Pollard Date of Service: 08/16/2020 12:45 PM Medical Record Number: [id number] Patient Account Number: [id number] Date of Birth/Sex: July 19, 1940 (79 y.o. F) Treating RN: Rogers Blocker Primary Care Provider: Einar Crow Other Clinician: Referring Provider: Einar Crow Treating Provider/Extender: Rowan Blase Weeks in Treatment: 6 Electronic Signature(s) Signed: 08/18/2020 11:27:08 AM By: Phillis Haggis RN Entered By: Dondra Prader on 08/16/2020 12:53:20 Dondra Prader (08/18/2020) -------------------------------------------------------------------------------- Multi Wound Chart Details Patient Name: Lindsey Pollard Date of Service: 08/16/2020 12:45 PM Medical Record Number: Lindsey Pollard Patient Account Number: 08/18/2020 Date of Birth/Sex: 07/03/1940 (79 y.o. F) Treating RN: 11/09/1940 Primary Care Provider: 06-01-1986 Other Clinician: Referring Provider: Yevonne Pax Treating Provider/Extender: Einar Crow in Treatment: 6 Vital Signs Height(in): 59 Pulse(bpm): 52 Weight(lbs): 153 Blood Pressure(mmHg): 167/65 Body Mass Index(BMI): 31 Temperature(F): 97.6 Respiratory Rate(breaths/min): 18 Photos: [N/A:N/A] Wound Location: Right Gluteal fold N/A  N/A Wounding Event: Gradually Appeared N/A N/A Primary Etiology: Abscess N/A N/A Comorbid History: Cataracts, Hypertension, Myocardial N/A N/A Infarction, Type II Diabetes, History of pressure wounds, Osteoarthritis Date Acquired: 05/30/2020 N/A N/A Weeks of Treatment: 6 N/A N/A Wound Status: Open N/A N/A Measurements L x W x D (cm) 0x0x0 N/A N/A Area (cm) : 0 N/A N/A Volume (cm) : 0 N/A N/A % Reduction in Area: 100.00% N/A N/A % Reduction in Volume: 100.00% N/A N/A Classification: Full Thickness Without Exposed N/A N/A Support Structures Exudate Amount: None Present  N/A N/A Wound Margin: Flat and Intact N/A N/A Granulation Amount: None Present (0%) N/A N/A Necrotic Amount: None Present (0%) N/A N/A Exposed Structures: Fascia: No N/A N/A Fat Layer (Subcutaneous Tissue): No Tendon: No Muscle: No Joint: No Bone: No Epithelialization: None N/A N/A Treatment Notes Electronic Signature(s) Signed: 08/16/2020 5:02:46 PM By: Phillis Haggis, Dondra Prader RN Entered By: Phillis Haggis, Dondra Prader on 08/16/2020 13:00:34 Lindsey Pollard ([id number]) -------------------------------------------------------------------------------- Multi-Disciplinary Care Plan Details Patient Name: Lindsey Pollard Date of Service: 08/16/2020 12:45 PM Medical Record Number: [id number] Patient Account Number: [id number] Date of Birth/Sex: 09/25/1940 (79 y.o. F) Treating RN: Rogers Blocker Primary Care Provider: Einar Crow Other Clinician: Referring Provider: Einar Crow Treating Provider/Extender: Rowan Blase in Treatment: 6 Active Inactive Electronic Signature(s) Signed: 08/16/2020 5:02:46 PM By: Phillis Haggis, Dondra Prader RN Entered By: Phillis Haggis, Dondra Prader on 08/16/2020 13:00:26 Lindsey Pollard ([id number]) -------------------------------------------------------------------------------- Pain Assessment Details Patient Name: Lindsey Pollard Date of Service: 08/16/2020 12:45 PM Medical Record  Number: [id number] Patient Account Number: [id number] Date of Birth/Sex: 10/16/1940 (79 y.o. F) Treating RN: Yevonne Pax Primary Care Provider: Einar Crow Other Clinician: Referring Provider: Einar Crow Treating Provider/Extender: Rowan Blase in Treatment: 6 Active Problems Location of Pain Severity and Description of Pain Patient Has Paino No Site Locations Pain Management and Medication Current Pain Management: Electronic Signature(s) Signed: 08/18/2020 11:27:08 AM By: Yevonne Pax RN Entered By: Yevonne Pax on 08/16/2020 12:48:25 Lindsey Pollard ([id number]) -------------------------------------------------------------------------------- Patient/Caregiver Education Details Patient Name: Lindsey Pollard Date of Service: 08/16/2020 12:45 PM Medical Record Number: [id number] Patient Account Number: [id number] Date of Birth/Gender: 10-16-1940 (79 y.o. F) Treating RN: Rogers Blocker Primary Care Physician: Einar Crow Other Clinician: Referring Physician: Einar Crow Treating Physician/Extender: Rowan Blase in Treatment: 6 Education Assessment Education Provided To: Patient Education Topics Provided Notes apply aandd ointment to irritated area Electronic Signature(s) Signed: 08/16/2020 5:02:46 PM By: Phillis Haggis, Dondra Prader RN Entered By: Phillis Haggis, Kenia on 08/16/2020 13:03:30 Lindsey Pollard ([id number]) -------------------------------------------------------------------------------- Wound Assessment Details Patient Name: Lindsey Pollard Date of Service: 08/16/2020 12:45 PM Medical Record Number: [id number] Patient Account Number: [id number] Date of Birth/Sex: 1940-12-21 (79 y.o. F) Treating RN: Yevonne Pax Primary Care Provider: Einar Crow Other Clinician: Referring Provider: Einar Crow Treating Provider/Extender: Rowan Blase in Treatment: 6 Wound Status Wound Number: 2 Primary Abscess Etiology: Wound  Location: Right Gluteal fold Wound Open Wounding Event: Gradually Appeared Status: Date Acquired: 05/30/2020 Comorbid Cataracts, Hypertension, Myocardial Infarction, Type II Weeks Of Treatment: 6 History: Diabetes, History of pressure wounds, Osteoarthritis Clustered Wound: No Photos Wound Measurements Length: (cm) 0 Width: (cm) 0 Depth: (cm) 0 Area: (cm) 0 Volume: (cm) 0 % Reduction in Area: 100% % Reduction in Volume: 100% Epithelialization: None Tunneling: No Undermining: No Wound Description Classification: Full Thickness Without Exposed Support Structures Wound Margin: Flat and Intact Exudate Amount: None Present Foul Odor After Cleansing: No Slough/Fibrino No Wound Bed Granulation Amount: None Present (0%) Exposed Structure Necrotic Amount: None Present (0%) Fascia Exposed: No Fat Layer (Subcutaneous Tissue) Exposed: No Tendon Exposed: No Muscle Exposed: No Joint Exposed: No Bone Exposed: No Electronic Signature(s) Signed: 08/18/2020 11:27:08 AM By: Yevonne Pax RN Entered By: Yevonne Pax on 08/16/2020 12:53:06 Lindsey Pollard ([id number]) -------------------------------------------------------------------------------- Vitals Details Patient Name: Lindsey Pollard Date of Service: 08/16/2020 12:45 PM Medical Record Number: [id number] Patient Account Number: [id number] Date of Birth/Sex: 1941/03/19 (79 y.o. F) Treating RN: Yevonne Pax Primary Care Provider: Einar Crow Other Clinician: Referring Provider: Einar Crow Treating Provider/Extender: Larina Bras,  Hoyt Weeks in Treatment: 6 Vital Signs Time Taken: 12:56 Temperature (F): 97.6 Height (in): 59 Pulse (bpm): 52 Weight (lbs): 153 Respiratory Rate (breaths/min): 18 Body Mass Index (BMI): 30.9 Blood Pressure (mmHg): 167/65 Reference Range: 80 - 120 mg / dl Electronic Signature(s) Signed: 08/18/2020 11:27:08 AM By: Yevonne PaxEpps, Carrie RN Entered By: Yevonne PaxEpps, Carrie on 08/16/2020 12:57:05

## 2020-08-18 NOTE — Progress Notes (Signed)
Lindsey Pollard, Lindsey Pollard ([id number]) Visit Report for 08/16/2020 Fall Risk Assessment Details Patient Name: Lindsey Pollard, Lindsey Pollard Date of Service: 08/16/2020 12:45 PM Medical Record Number: [id number] Patient Account Number: [id number] Date of Birth/Sex: [birth year] (79 y.o. F) Treating RN: Yevonne Pax Primary Care Provider: Einar Crow Other Clinician: Referring Provider: Einar Crow Treating Provider/Extender: Rowan Blase in Treatment: 6 Fall Risk Assessment Items Have you had 2 or more falls in the last 12 monthso 0 No Have you had any fall that resulted in injury in the last 12 monthso 0 No FALLS RISK SCREEN History of falling - immediate or within 3 months 25 Yes Secondary diagnosis (Do you have 2 or more medical diagnoseso) 0 No Ambulatory aid None/bed rest/wheelchair/nurse 0 No Crutches/cane/walker 0 No Furniture 0 No Intravenous therapy Access/Saline/Heparin Lock 0 No Gait/Transferring Normal/ bed rest/ wheelchair 0 No Weak (short steps with or without shuffle, stooped but able to lift head while walking, may 0 No seek support from furniture) Impaired (short steps with shuffle, may have difficulty arising from chair, head down, impaired 0 No balance) Mental Status Oriented to own ability 0 No Electronic Signature(s) Signed: 08/18/2020 11:27:08 AM By: Yevonne Pax RN Entered By: Yevonne Pax on 08/16/2020 12:47:44

## 2020-09-12 ENCOUNTER — Ambulatory Visit (INDEPENDENT_AMBULATORY_CARE_PROVIDER_SITE_OTHER): Payer: Medicare Other | Admitting: Vascular Surgery

## 2020-09-12 ENCOUNTER — Encounter (INDEPENDENT_AMBULATORY_CARE_PROVIDER_SITE_OTHER): Payer: Medicare Other | Admitting: Vascular Surgery

## 2020-09-12 ENCOUNTER — Other Ambulatory Visit: Payer: Self-pay

## 2020-09-12 ENCOUNTER — Ambulatory Visit (INDEPENDENT_AMBULATORY_CARE_PROVIDER_SITE_OTHER): Payer: Medicare Other

## 2020-09-12 ENCOUNTER — Encounter (INDEPENDENT_AMBULATORY_CARE_PROVIDER_SITE_OTHER): Payer: Medicare Other

## 2020-09-12 ENCOUNTER — Encounter (INDEPENDENT_AMBULATORY_CARE_PROVIDER_SITE_OTHER): Payer: Self-pay | Admitting: Vascular Surgery

## 2020-09-12 VITALS — BP 172/56 | HR 68 | Resp 16 | Ht 59.0 in | Wt <= 1120 oz

## 2020-09-12 DIAGNOSIS — I70213 Atherosclerosis of native arteries of extremities with intermittent claudication, bilateral legs: Secondary | ICD-10-CM

## 2020-09-12 DIAGNOSIS — I739 Peripheral vascular disease, unspecified: Secondary | ICD-10-CM

## 2020-09-12 DIAGNOSIS — N183 Chronic kidney disease, stage 3 unspecified: Secondary | ICD-10-CM

## 2020-09-12 DIAGNOSIS — R0989 Other specified symptoms and signs involving the circulatory and respiratory systems: Secondary | ICD-10-CM | POA: Diagnosis not present

## 2020-09-12 DIAGNOSIS — I25118 Atherosclerotic heart disease of native coronary artery with other forms of angina pectoris: Secondary | ICD-10-CM

## 2020-09-12 DIAGNOSIS — E1122 Type 2 diabetes mellitus with diabetic chronic kidney disease: Secondary | ICD-10-CM | POA: Diagnosis not present

## 2020-09-12 DIAGNOSIS — E782 Mixed hyperlipidemia: Secondary | ICD-10-CM

## 2020-09-12 NOTE — Progress Notes (Signed)
MRN : [id number]  Lindsey Pollard is a 80 y.o. (01-18-1941) female who presents with chief complaint of  Chief Complaint  Patient presents with   New Patient (Initial Visit)    Ref Excell Seltzer PVD  .  History of Present Illness:    The patient is seen for evaluation of painful lower extremities and diminished pulses. Patient notes the pain is always associated with activity and is very consistent day today. Typically, the pain occurs at less than one block, progress is as activity continues to the point that the patient must stop walking. Resting including standing still for several minutes allowed resumption of the activity and the ability to walk a similar distance before stopping again. Uneven terrain and inclined shorten the distance. The pain has been progressive over the past several years. The patient states the inability to walk is now having a profound negative impact on quality of life and daily activities.  The patient denies rest pain or dangling of an extremity off the side of the bed during the night for relief. No open wounds or sores at this time. No prior interventions or surgeries.  No history of back problems or DJD of the lumbar sacral spine.   The patient denies changes in claudication symptoms or new rest pain symptoms.  No new ulcers or wounds of the foot.  The patient's blood pressure has been stable and relatively well controlled. The patient denies amaurosis fugax or recent TIA symptoms. There are no recent neurological changes noted. The patient denies history of DVT, PE or superficial thrombophlebitis. The patient denies recent episodes of angina or shortness of breath.   Current Meds  Medication Sig   aspirin 81 MG EC tablet Take by mouth.   atorvastatin (LIPITOR) 80 MG tablet TAKE ONE TABLET BY MOUTH ONCE NIGHTLY   ergocalciferol (VITAMIN D2) 1.25 MG (50000 UT) capsule Take by mouth.   furosemide (LASIX) 40 MG tablet Take 40 mg by mouth daily.   insulin  glargine (LANTUS) 100 UNIT/ML injection Inject into the skin.   insulin lispro (HUMALOG) 100 UNIT/ML KwikPen USE PER SLIDING SCALE UP TO 10 UNITS 3 TIMES DAILY (  MAXIMUM DAILY DOSE: 30  UNITS)   Insulin Pen Needle (B-D UF III MINI PEN NEEDLES) 31G X 5 MM MISC See admin instructions.   lisinopril-hydrochlorothiazide (ZESTORETIC) 20-12.5 MG tablet Take 1 tablet by mouth daily.   metoprolol succinate (TOPROL-XL) 100 MG 24 hr tablet Take 1 tablet by mouth daily.   nitroGLYCERIN (NITROSTAT) 0.4 MG SL tablet DISSOLVE 1 TABLET UNDER THE TONGUE EVERY 5 MINUTES AS  NEEDED FOR CHEST PAIN. MAX  OF 3 TABLETS IN 15 MINUTES. CALL 911 IF PAIN PERSISTS.   pramipexole (MIRAPEX) 0.25 MG tablet Take 1 tablet by mouth 3 (three) times daily.   senna (SENOKOT) 8.6 MG tablet Take 2 tablets by mouth at bedtime.    Past Medical History:  Diagnosis Date   Diabetes mellitus without complication (HCC)    Hyperlipidemia    Hypertension    Myocardial infarction [facility])     Past Surgical History:  Procedure Laterality Date   CARDIAC SURGERY     bypass   EYE SURGERY     cataract extraction    Social History Social History   Tobacco Use   Smoking status: Former Smoker    Types: Cigarettes   Smokeless tobacco: Never Used  Substance Use Topics   Alcohol use: Not Currently   Drug use: Not Currently  Family History Family History  Problem Relation Age of Onset   Congestive Heart Failure Mother    Diabetes Father    Breast cancer Neg Hx   No family history of bleeding/clotting disorders, porphyria or autoimmune disease   Allergies  Allergen Reactions   Other Swelling   Tetanus Toxoids Swelling    Tetanus and diphtheria toxids   Penicillin G Rash     REVIEW OF SYSTEMS (Negative unless checked)  Constitutional: [] Weight loss  [] Fever  [] Chills Cardiac: [] Chest pain   [] Chest pressure   [] Palpitations   [] Shortness of breath when laying flat   [] Shortness of breath with  exertion. Vascular:  [x] Pain in legs with walking   [] Pain in legs at rest  [] History of DVT   [] Phlebitis   [x] Swelling in legs   [] Varicose veins   [] Non-healing ulcers Pulmonary:   [] Uses home oxygen   [] Productive cough   [] Hemoptysis   [] Wheeze  [] COPD   [] Asthma Neurologic:  [] Dizziness   [] Seizures   [] History of stroke   [] History of TIA  [] Aphasia   [] Vissual changes   [] Weakness or numbness in arm   [] Weakness or numbness in leg Musculoskeletal:   [] Joint swelling   [x] Joint pain   [] Low back pain Hematologic:  [] Easy bruising  [] Easy bleeding   [] Hypercoagulable state   [] Anemic Gastrointestinal:  [] Diarrhea   [] Vomiting  [] Gastroesophageal reflux/heartburn   [] Difficulty swallowing. Genitourinary:  [x] Chronic kidney disease   [] Difficult urination  [] Frequent urination   [] Blood in urine Skin:  [] Rashes   [] Ulcers  Psychological:  [] History of anxiety   []  History of major depression.  Physical Examination  Vitals:   09/12/20 1550  BP: (!) 172/56  Pulse: 68  Resp: 16  Weight: 14 lb 9.6 oz (6.623 kg)  Height: 4\' 11"  (1.499 m)   Body mass index is 2.95 kg/m. Gen: WD/WN, NAD Head: [place]/AT, No temporalis wasting.  Ear/Nose/Throat: Hearing grossly intact, nares w/o erythema or drainage, poor dentition Eyes: PER, EOMI, sclera nonicteric.  Neck: Supple, no masses.  No bruit or JVD.  Pulmonary:  Good air movement, clear to auscultation bilaterally, no use of accessory muscles.  Cardiac: RRR, normal S1, S2, no Murmurs. Vascular:  Carotid bruits bilaterally; scattered varicosities present bilaterally.  Mild venous stasis changes to the legs bilaterally.  2+ soft pitting edema. Vessel Right Left  Radial Palpable Palpable  Brachial Palpable Palpable  Carotid Palpable Palpable  PT Not Palpable Not Palpable  DP Not Palpable Not Palpable  Gastrointestinal: soft, non-distended. No guarding/no peritoneal signs.  Musculoskeletal: M/S 5/5 throughout.  No deformity or atrophy.   Neurologic: CN 2-12 intact. Pain and light touch intact in extremities.  Symmetrical.  Speech is fluent. Motor exam as listed above. Psychiatric: Judgment intact, Mood & affect appropriate for pt's clinical situation. Dermatologic: Mild venous rashes No ulcers noted.  No changes consistent with cellulitis. Lymph : Mild lichenification or skin changes of chronic lymphedema.  CBC No results found for: WBC, HGB, HCT, MCV, PLT  BMET No results found for: NA, K, CL, CO2, GLUCOSE, BUN, CREATININE, CALCIUM, GFRNONAA, GFRAA CrCl cannot be calculated (No successful lab value found.).  COAG No results found for: INR, PROTIME  Radiology VAS ABI WITH/WO TBI  Result Date: 09/12/2020 LOWER EXTREMITY DOPPLER STUDY Indications: Peripheral artery disease.  Performing Technologist: RT, RDMS, RVT  Examination Guidelines: A complete evaluation includes at minimum, Doppler waveform signals and systolic blood pressure reading at the level of bilateral brachial, anterior tibial,  and posterior tibial arteries, when vessel segments are accessible. Bilateral testing is considered an integral part of a complete examination. Photoelectric Plethysmograph (PPG) waveforms and toe systolic pressure readings are included as required and additional duplex testing as needed. Limited examinations for reoccurring indications may be performed as noted.  ABI Findings: +---------+------------------+-----+-----------+------------------+  Right     Rt Pressure (mmHg) Index Waveform    Comment             +---------+------------------+-----+-----------+------------------+  Brachial  153                                                      +---------+------------------+-----+-----------+------------------+  ATA       80                 0.48  monophasic                      +---------+------------------+-----+-----------+------------------+  PTA                                no detected collateral present   +---------+------------------+-----+-----------+------------------+  PERO      78                 0.47  monophasic                      +---------+------------------+-----+-----------+------------------+  Great Toe 42                 0.25  Abnormal                        +---------+------------------+-----+-----------+------------------+ +---------+------------------+-----+------------+------------------+  Left      Lt Pressure (mmHg) Index Waveform     Comment             +---------+------------------+-----+------------+------------------+  Brachial  166                                                       +---------+------------------+-----+------------+------------------+  ATA       76                 0.46  monophasic                       +---------+------------------+-----+------------+------------------+  PTA                                not detected collateral present  +---------+------------------+-----+------------+------------------+  PERO      70                 0.42  monophasic                       +---------+------------------+-----+------------+------------------+  Great Toe 36                 0.22  Abnormal                         +---------+------------------+-----+------------+------------------+  Summary: Bilateral: Resting bilateral ankle-brachial index indicates severe lower extremity arterial disease. Bilateral toe-brachial indexes are abnormal.  *See table(s) above for measurements and observations.  Electronically signed by Levora Dredge MD on 09/12/2020 at 5:14:42 PM.   Final    VAS [place] LOWER EXTREMITY ARTERIAL DUPLEX  Result Date: 09/12/2020 LOWER EXTREMITY ARTERIAL DUPLEX STUDY Indications: Peripheral artery disease.  Current ABI: Right=0.48 & Left=0.46 Performing Technologist: Jamse Mead RT, RDMS, RVT  Examination Guidelines: A complete evaluation includes B-mode imaging, spectral Doppler, color Doppler, and power Doppler as needed of all accessible portions of each vessel.  Bilateral testing is considered an integral part of a complete examination. Limited examinations for reoccurring indications may be performed as noted.  +-----------+--------+-----+---------------+------------+--------+  RIGHT       PSV cm/s Ratio Stenosis        Waveform     Comments  +-----------+--------+-----+---------------+------------+--------+  EIA Distal  146                            monophasic             +-----------+--------+-----+---------------+------------+--------+  CFA Distal  327            50-74% stenosis monophasic             +-----------+--------+-----+---------------+------------+--------+  DFA         279                            monophasic             +-----------+--------+-----+---------------+------------+--------+  SFA Prox                                   not detected           +-----------+--------+-----+---------------+------------+--------+  SFA Mid                                    not detected           +-----------+--------+-----+---------------+------------+--------+  SFA Distal  19                             monophasic             +-----------+--------+-----+---------------+------------+--------+  POP Prox    200            50-74% stenosis monophasic             +-----------+--------+-----+---------------+------------+--------+  POP Distal  48                             monophasic             +-----------+--------+-----+---------------+------------+--------+  ATA Distal  40                             monophasic             +-----------+--------+-----+---------------+------------+--------+  PTA Distal                                 not detected           +-----------+--------+-----+---------------+------------+--------+  PERO Distal 35                             monophasic             +-----------+--------+-----+---------------+------------+--------+  +-----------+--------+-----+---------------+------------+--------+  LEFT        PSV cm/s Ratio Stenosis        Waveform      Comments  +-----------+--------+-----+---------------+------------+--------+  EIA Distal  148                            monophasic             +-----------+--------+-----+---------------+------------+--------+  CFA Distal  304            50-74% stenosis monophasic             +-----------+--------+-----+---------------+------------+--------+  DFA         185                            monophasic             +-----------+--------+-----+---------------+------------+--------+  SFA Prox    231                            monophasic             +-----------+--------+-----+---------------+------------+--------+  SFA Mid     50                             monophasic             +-----------+--------+-----+---------------+------------+--------+  SFA Distal  76                             monophasic             +-----------+--------+-----+---------------+------------+--------+  POP Prox    43                             monophasic             +-----------+--------+-----+---------------+------------+--------+  POP Distal  51                             monophasic             +-----------+--------+-----+---------------+------------+--------+  ATA Distal  42                             monophasic             +-----------+--------+-----+---------------+------------+--------+  PTA Distal                                 not detected           +-----------+--------+-----+---------------+------------+--------+  PERO Distal 34                             monophasic             +-----------+--------+-----+---------------+------------+--------+  Summary: Right: Doppler waveforms suggest significant aortoiliac level arterial disease. 50-74%  stenosis noted in the common femoral artery. Total occlusion noted in the superficial femoral artery. 50-74% stenosis noted in the popliteal artery. Left: Doppler waveforms suggest significant aortoiliac level arterial disease. 50-74% stenosis noted in the common femoral artery.  See table(s)  above for measurements and observations. Electronically signed by Levora DredgeGregory Poetry Cerro MD on 09/12/2020 at 5:14:45 PM.    Final      Assessment/Plan 1. Atherosclerosis of native artery of both lower extremities with intermittent claudication (HCC)  Recommend:  The patient has evidence of atherosclerosis of the lower extremities with claudication.  The patient does not voice lifestyle limiting changes at this point in time.  Noninvasive studies do not suggest clinically significant change.  No invasive studies, angiography or surgery at this time The patient should continue walking and begin a more formal exercise program.  The patient should continue antiplatelet therapy and aggressive treatment of the lipid abnormalities  No changes in the patient's medications at this time  The patient should continue wearing graduated compression socks 10-15 mmHg strength to control the mild edema.    2. Bilateral carotid bruits Recommend:  Given the patient's asymptomatic bruits no invasive testing or surgery at this time.  Duplex ultrasound will be ordered.  Continue antiplatelet therapy as prescribed Continue management of CAD, HTN and Hyperlipidemia.  Healthy heart diet,  encouraged exercise at least 4 times per week.  Follow up in 2 months with duplex ultrasound and physical exam   3. Coronary artery disease of native artery of native heart with stable angina pectoris (HCC) Continue cardiac and antihypertensive medications as already ordered and reviewed, no changes at this time.  Continue statin as ordered and reviewed, no changes at this time  Nitrates PRN for chest pain   4. Type 2 diabetes mellitus with stage 3 chronic kidney disease, unspecified whether long term insulin use, unspecified whether stage 3a or 3b CKD (HCC) Continue hypoglycemic medications as already ordered, these medications have been reviewed and there are no changes at this time.  Hgb A1C to be monitored as  already arranged by primary service   5. Mixed hyperlipidemia Continue statin as ordered and reviewed, no changes at this time     Levora DredgeGregory Crystalmarie Yasin, MD  09/12/2020 8:49 PM

## 2020-09-16 ENCOUNTER — Encounter (INDEPENDENT_AMBULATORY_CARE_PROVIDER_SITE_OTHER): Payer: Self-pay | Admitting: Vascular Surgery

## 2020-09-16 DIAGNOSIS — R0989 Other specified symptoms and signs involving the circulatory and respiratory systems: Secondary | ICD-10-CM | POA: Insufficient documentation

## 2020-09-16 DIAGNOSIS — I70219 Atherosclerosis of native arteries of extremities with intermittent claudication, unspecified extremity: Secondary | ICD-10-CM | POA: Insufficient documentation

## 2020-10-03 ENCOUNTER — Ambulatory Visit (INDEPENDENT_AMBULATORY_CARE_PROVIDER_SITE_OTHER): Payer: Medicare Other | Admitting: Vascular Surgery

## 2020-10-03 ENCOUNTER — Encounter (INDEPENDENT_AMBULATORY_CARE_PROVIDER_SITE_OTHER): Payer: Medicare Other

## 2020-10-19 ENCOUNTER — Other Ambulatory Visit (INDEPENDENT_AMBULATORY_CARE_PROVIDER_SITE_OTHER): Payer: Self-pay | Admitting: Vascular Surgery

## 2020-10-19 DIAGNOSIS — R0989 Other specified symptoms and signs involving the circulatory and respiratory systems: Secondary | ICD-10-CM

## 2020-10-20 ENCOUNTER — Ambulatory Visit (INDEPENDENT_AMBULATORY_CARE_PROVIDER_SITE_OTHER): Payer: Medicare Other

## 2020-10-20 ENCOUNTER — Ambulatory Visit (INDEPENDENT_AMBULATORY_CARE_PROVIDER_SITE_OTHER): Payer: Medicare Other | Admitting: Vascular Surgery

## 2020-10-20 ENCOUNTER — Other Ambulatory Visit: Payer: Self-pay

## 2020-10-20 ENCOUNTER — Encounter (INDEPENDENT_AMBULATORY_CARE_PROVIDER_SITE_OTHER): Payer: Self-pay | Admitting: Vascular Surgery

## 2020-10-20 VITALS — BP 148/90 | HR 57 | Resp 16 | Wt 148.0 lb

## 2020-10-20 DIAGNOSIS — R0989 Other specified symptoms and signs involving the circulatory and respiratory systems: Secondary | ICD-10-CM

## 2020-10-20 DIAGNOSIS — E782 Mixed hyperlipidemia: Secondary | ICD-10-CM

## 2020-10-20 DIAGNOSIS — N183 Chronic kidney disease, stage 3 unspecified: Secondary | ICD-10-CM

## 2020-10-20 DIAGNOSIS — I6523 Occlusion and stenosis of bilateral carotid arteries: Secondary | ICD-10-CM | POA: Diagnosis not present

## 2020-10-20 DIAGNOSIS — I25118 Atherosclerotic heart disease of native coronary artery with other forms of angina pectoris: Secondary | ICD-10-CM | POA: Diagnosis not present

## 2020-10-20 DIAGNOSIS — I70213 Atherosclerosis of native arteries of extremities with intermittent claudication, bilateral legs: Secondary | ICD-10-CM

## 2020-10-20 DIAGNOSIS — E1122 Type 2 diabetes mellitus with diabetic chronic kidney disease: Secondary | ICD-10-CM | POA: Diagnosis not present

## 2020-10-26 ENCOUNTER — Encounter (INDEPENDENT_AMBULATORY_CARE_PROVIDER_SITE_OTHER): Payer: Self-pay | Admitting: Vascular Surgery

## 2020-10-26 DIAGNOSIS — I6529 Occlusion and stenosis of unspecified carotid artery: Secondary | ICD-10-CM | POA: Insufficient documentation

## 2020-10-26 NOTE — Progress Notes (Signed)
MRN : [id number]  Lindsey Pollard is a 80 y.o. ([birth year]) female who presents with chief complaint of  Chief Complaint  Patient presents with  . Carotid    Ultrasound follow up  .  History of Present Illness:   The patient is seen for follow up evaluation of carotid stenosis. The carotid stenosis followed by ultrasound.   The patient denies amaurosis fugax. There is no recent history of TIA symptoms or focal motor deficits. There is no prior documented CVA.  The patient is taking enteric-coated aspirin 81 mg daily.  There is no history of migraine headaches. There is no history of seizures.  The patient has a history of coronary artery disease, no recent episodes of angina or shortness of breath. The patient denies PAD or claudication symptoms. There is a history of hyperlipidemia which is being treated with a statin.    Carotid Duplex done today shows [place] 40 to 59% stenosis and [place] 1 to 39% stenosis.    Current Meds  Medication Sig  . aspirin 81 MG EC tablet Take by mouth.  Marland Kitchen atorvastatin (LIPITOR) 80 MG tablet TAKE ONE TABLET BY MOUTH ONCE NIGHTLY  . baclofen (LIORESAL) 10 MG tablet Take 10 mg by mouth 3 (three) times daily.  . ergocalciferol (VITAMIN D2) 1.25 MG (50000 UT) capsule Take by mouth.  . insulin glargine (LANTUS) 100 UNIT/ML injection Inject 8 Units into the skin at bedtime. Per sliding scale  . insulin lispro (HUMALOG) 100 UNIT/ML KwikPen USE PER SLIDING SCALE UP TO 10 UNITS 3 TIMES DAILY (  MAXIMUM DAILY DOSE: 30  UNITS)  . Insulin Pen Needle (B-D UF III MINI PEN NEEDLES) 31G X 5 MM MISC See admin instructions.  Marland Kitchen lisinopril-hydrochlorothiazide (ZESTORETIC) 20-12.5 MG tablet Take 1 tablet by mouth daily.  . metoprolol succinate (TOPROL-XL) 100 MG 24 hr tablet Take 1 tablet by mouth daily.  . nitroGLYCERIN (NITROSTAT) 0.4 MG SL tablet DISSOLVE 1 TABLET UNDER THE TONGUE EVERY 5 MINUTES AS  NEEDED FOR CHEST PAIN. MAX  OF 3 TABLETS IN 15 MINUTES. CALL 911 IF PAIN  PERSISTS.  Marland Kitchen pramipexole (MIRAPEX) 0.25 MG tablet Take 1 tablet by mouth 3 (three) times daily.  Marland Kitchen senna (SENOKOT) 8.6 MG tablet Take 2 tablets by mouth at bedtime.  . torsemide (DEMADEX) 10 MG tablet Take 10 mg by mouth daily.    Past Medical History:  Diagnosis Date  . Diabetes mellitus without complication (HCC)   . Hyperlipidemia   . Hypertension   . Myocardial infarction [facility])     Past Surgical History:  Procedure Laterality Date  . CARDIAC SURGERY     bypass  . EYE SURGERY     cataract extraction    Social History Social History   Tobacco Use  . Smoking status: Former Smoker    Types: Cigarettes  . Smokeless tobacco: Never Used  Substance Use Topics  . Alcohol use: Not Currently  . Drug use: Not Currently    Family History Family History  Problem Relation Age of Onset  . Congestive Heart Failure Mother   . Diabetes Father   . Breast cancer Neg Hx     Allergies  Allergen Reactions  . Other Swelling  . Tetanus Toxoids Swelling    Tetanus and diphtheria toxids  . Penicillin G Rash     REVIEW OF SYSTEMS (Negative unless checked)  Constitutional: [] Weight loss  [] Fever  [] Chills Cardiac: [] Chest pain   [] Chest pressure   [] Palpitations   [] Shortness  of breath when laying flat   [] Shortness of breath with exertion. Vascular:  [] Pain in legs with walking   [] Pain in legs at rest  [] History of DVT   [] Phlebitis   [] Swelling in legs   [] Varicose veins   [] Non-healing ulcers Pulmonary:   [] Uses home oxygen   [] Productive cough   [] Hemoptysis   [] Wheeze  [] COPD   [] Asthma Neurologic:  [] Dizziness   [] Seizures   [] History of stroke   [] History of TIA  [] Aphasia   [] Vissual changes   [] Weakness or numbness in arm   [] Weakness or numbness in leg Musculoskeletal:   [] Joint swelling   [] Joint pain   [] Low back pain Hematologic:  [] Easy bruising  [] Easy bleeding   [] Hypercoagulable state   [] Anemic Gastrointestinal:  [] Diarrhea   [] Vomiting  [] Gastroesophageal  reflux/heartburn   [] Difficulty swallowing. Genitourinary:  [] Chronic kidney disease   [] Difficult urination  [] Frequent urination   [] Blood in urine Skin:  [] Rashes   [] Ulcers  Psychological:  [] History of anxiety   []  History of major depression.  Physical Examination  Vitals:   10/20/20 1550  BP: (!) 148/90  Pulse: (!) 57  Resp: 16  Weight: 148 lb (67.1 kg)   Body mass index is 29.89 kg/m. Gen: WD/WN, NAD Head: [place]/AT, No temporalis wasting.  Ear/Nose/Throat: Hearing grossly intact, nares w/o erythema or drainage Eyes: PER, EOMI, sclera nonicteric.  Neck: Supple, no large masses.   Pulmonary:  Good air movement, no audible wheezing bilaterally, no use of accessory muscles.  Cardiac: RRR, no JVD Vascular: Right carotid bruit Vessel Right Left  Radial Palpable Palpable  Brachial Palpable Palpable  Carotid Palpable Palpable  Gastrointestinal: Non-distended. No guarding/no peritoneal signs.  Musculoskeletal: M/S 5/5 throughout.  No deformity or atrophy.  Neurologic: CN 2-12 intact. Symmetrical.  Speech is fluent. Motor exam as listed above. Psychiatric: Judgment intact, Mood & affect appropriate for pt's clinical situation. Dermatologic: No rashes or ulcers noted.  No changes consistent with cellulitis.   CBC No results found for: WBC, HGB, HCT, MCV, PLT  BMET No results found for: NA, K, CL, CO2, GLUCOSE, BUN, CREATININE, CALCIUM, GFRNONAA, GFRAA CrCl cannot be calculated (No successful lab value found.).  COAG No results found for: INR, PROTIME  Radiology VAS CAROTID  Result Date: 10/20/2020 Carotid Arterial Duplex Study Patient Name:  Lindsey Pollard [facility]  Date of Exam:   10/20/2020 Medical Rec #:      Accession #:    Date of Birth: Dec 04, 1940      Patient Gender: F Patient Age:   71Y Exam Location:  [facility] Procedure:      VAS CAROTID Referring Phys:  Roxine Whittinghill  --------------------------------------------------------------------------------  Indications: Bilateral bruits and Carotid artery disease. Performing Technologist: RVT  Examination Guidelines: A complete evaluation includes B-mode imaging, spectral Doppler, color Doppler, and power Doppler as needed of all accessible portions of each vessel. Bilateral testing is considered an integral part of a complete examination. Limited examinations for reoccurring indications may be performed as noted.  Right Carotid Findings: +----------+--------+--------+--------+----------------------+--------+           PSV cm/sEDV cm/sStenosisPlaque Description    Comments +----------+--------+--------+--------+----------------------+--------+ CCA Prox  90      8                                              +----------+--------+--------+--------+----------------------+--------+  CCA Mid   67      9                                              +----------+--------+--------+--------+----------------------+--------+ CCA Distal74      11                                             +----------+--------+--------+--------+----------------------+--------+ ICA Prox  116     21                                             +----------+--------+--------+--------+----------------------+--------+ ICA Mid   160     29      40-59%  irregular and calcific         +----------+--------+--------+--------+----------------------+--------+ ICA Distal163     31                                             +----------+--------+--------+--------+----------------------+--------+ ECA       93      4                                              +----------+--------+--------+--------+----------------------+--------+ +----------+--------+-------+----------------+-------------------+           PSV cm/sEDV cmsDescribe        Arm Pressure (mmHG)  +----------+--------+-------+----------------+-------------------+ [id number]            Multiphasic, WNL                    +----------+--------+-------+----------------+-------------------+ +---------+--------+--+--------+---------+ VertebralPSV cm/s81EDV cm/sAntegrade +---------+--------+--+--------+---------+  Left Carotid Findings: +----------+--------+--------+--------+----------------------+--------+           PSV cm/sEDV cm/sStenosisPlaque Description    Comments +----------+--------+--------+--------+----------------------+--------+ CCA Prox  77      12                                             +----------+--------+--------+--------+----------------------+--------+ CCA Mid   65      11                                             +----------+--------+--------+--------+----------------------+--------+ CCA Distal64      14                                             +----------+--------+--------+--------+----------------------+--------+ ICA Prox  124     27                                             +----------+--------+--------+--------+----------------------+--------+  ICA Mid   128     17      1-39%   calcific and irregular         +----------+--------+--------+--------+----------------------+--------+ ICA Distal89      12                                             +----------+--------+--------+--------+----------------------+--------+ ECA       111     4                                              +----------+--------+--------+--------+----------------------+--------+ +----------+--------+--------+--------+-------------------+           PSV cm/sEDV cm/sDescribeArm Pressure (mmHG) +----------+--------+--------+--------+-------------------+ [id number]             Stenotic                    +----------+--------+--------+--------+-------------------+ +---------+--------+--+--------+ VertebralPSV cm/s65EDV cm/s  +---------+--------+--+--------+   Summary: Right Carotid: Velocities in the right ICA are consistent with a 40-59%                stenosis. Moderate calcified irregular plaque in the bulb/ICA. Left Carotid: Velocities in the left ICA are consistent with a 1-39% stenosis.               Moderate calcified irregular plaque in the bulb/ICA. Vertebrals:  Bilateral vertebral arteries demonstrate antegrade flow. Subclavians: Normal flow hemodynamics were seen in bilateral subclavian              arteries. *See table(s) above for measurements and observations.  Electronically signed by Levora DredgeGregory Jacier Gladu MD on 10/20/2020 at 5:49:52 PM.    Final      Assessment/Plan 1. Bilateral carotid artery stenosis Recommend:  Given the patient's asymptomatic subcritical stenosis no further invasive testing or surgery at this time.  Carotid Duplex done today shows [place] 40 to 59% stenosis and [place] 1 to 39% stenosis.    Continue antiplatelet therapy as prescribed Continue management of CAD, HTN and Hyperlipidemia Healthy heart diet,  encouraged exercise at least 4 times per week Follow up in 12 months with duplex ultrasound and physical exam  - VAS US CAROTID; Future  2. Atherosclerosis of native artery of both lower extremities with intermittent claudication (HCC)  Recommend:  The patient has evidence of atherosclerosis of the lower extremities with claudication.  The patient does not voice lifestyle limiting changes at this point in time.  Noninvasive studies do not suggest clinically significant change.  No invasive studies, angiography or surgery at this time The patient should continue walking and begin a more formal exercise program.  The patient should continue antiplatelet therapy and aggressive treatment of the lipid abnormalities  No changes in the patient's medications at this time  The patient should continue wearing graduated compression socks 10-15 mmHg strength to control the mild edema.    3.  Coronary artery disease of native artery of native heart with stable angina pectoris (HCC) Continue cardiac and antihypertensive medications as already ordered and reviewed, no changes at this time.  Continue statin as ordered and reviewed, no changes at this time  Nitrates PRN for chest pain   4. Type 2 diabetes mellitus with stage 3 chronic kidney disease, unspecified whether long term insulin use,  unspecified whether stage 3a or 3b CKD (HCC) Continue hypoglycemic medications as already ordered, these medications have been reviewed and there are no changes at this time.  Hgb A1C to be monitored as already arranged by primary service   5. Mixed hyperlipidemia Continue statin as ordered and reviewed, no changes at this time    Levora Dredge, MD  10/26/2020 4:54 PM

## 2021-01-11 ENCOUNTER — Emergency Department
Admission: EM | Admit: 2021-01-11 | Discharge: 2021-01-11 | Discharge status: Against Medical Advice | Payer: Medicare Other | Attending: Emergency Medicine | Admitting: Emergency Medicine

## 2021-01-11 ENCOUNTER — Other Ambulatory Visit: Payer: Self-pay

## 2021-01-11 DIAGNOSIS — E876 Hypokalemia: Secondary | ICD-10-CM | POA: Diagnosis not present

## 2021-01-11 DIAGNOSIS — Z951 Presence of aortocoronary bypass graft: Secondary | ICD-10-CM | POA: Insufficient documentation

## 2021-01-11 DIAGNOSIS — Z794 Long term (current) use of insulin: Secondary | ICD-10-CM | POA: Insufficient documentation

## 2021-01-11 DIAGNOSIS — I2510 Atherosclerotic heart disease of native coronary artery without angina pectoris: Secondary | ICD-10-CM | POA: Diagnosis not present

## 2021-01-11 DIAGNOSIS — E1122 Type 2 diabetes mellitus with diabetic chronic kidney disease: Secondary | ICD-10-CM | POA: Insufficient documentation

## 2021-01-11 DIAGNOSIS — R778 Other specified abnormalities of plasma proteins: Secondary | ICD-10-CM

## 2021-01-11 DIAGNOSIS — I10 Essential (primary) hypertension: Secondary | ICD-10-CM

## 2021-01-11 DIAGNOSIS — I129 Hypertensive chronic kidney disease with stage 1 through stage 4 chronic kidney disease, or unspecified chronic kidney disease: Secondary | ICD-10-CM | POA: Diagnosis present

## 2021-01-11 DIAGNOSIS — Z7982 Long term (current) use of aspirin: Secondary | ICD-10-CM | POA: Insufficient documentation

## 2021-01-11 DIAGNOSIS — N183 Chronic kidney disease, stage 3 unspecified: Secondary | ICD-10-CM | POA: Insufficient documentation

## 2021-01-11 DIAGNOSIS — Z79899 Other long term (current) drug therapy: Secondary | ICD-10-CM | POA: Insufficient documentation

## 2021-01-11 DIAGNOSIS — Z87891 Personal history of nicotine dependence: Secondary | ICD-10-CM | POA: Insufficient documentation

## 2021-01-11 LAB — COMPREHENSIVE METABOLIC PANEL
ALT: 17 U/L (ref 0–44)
AST: 21 U/L (ref 15–41)
Albumin: 3.9 g/dL (ref 3.5–5.0)
Alkaline Phosphatase: 142 U/L — ABNORMAL HIGH (ref 38–126)
Anion gap: 12 (ref 5–15)
BUN: 13 mg/dL (ref 8–23)
CO2: 26 mmol/L (ref 22–32)
Calcium: 8.9 mg/dL (ref 8.9–10.3)
Chloride: 98 mmol/L (ref 98–111)
Creatinine, Ser: 0.7 mg/dL (ref 0.44–1.00)
GFR, Estimated: >60 mL/min (ref >60)
Glucose, Bld: 91 mg/dL (ref 70–99)
Potassium: 2.8 mmol/L — ABNORMAL LOW (ref 3.5–5.1)
Sodium: 136 mmol/L (ref 135–145)
Total Bilirubin: 0.9 mg/dL (ref 0.3–1.2)
Total Protein: 6.6 g/dL (ref 6.5–8.1)

## 2021-01-11 LAB — CBC
HCT: 33.6 % — ABNORMAL LOW (ref 36.0–46.0)
Hemoglobin: 11.1 g/dL — ABNORMAL LOW (ref 12.0–15.0)
MCH: 30.3 pg (ref 26.0–34.0)
MCHC: 33 g/dL (ref 30.0–36.0)
MCV: 91.8 fL (ref 80.0–100.0)
Platelets: 267 10*3/uL (ref 150–400)
RBC: 3.66 MIL/uL — ABNORMAL LOW (ref 3.87–5.11)
RDW: 13.2 % (ref 11.5–15.5)
WBC: 7.1 10*3/uL (ref 4.0–10.5)
nRBC: 0 % (ref 0.0–0.2)

## 2021-01-11 LAB — TROPONIN I (HIGH SENSITIVITY)
Troponin I (High Sensitivity): 27 ng/L — ABNORMAL HIGH (ref <18)
Troponin I (High Sensitivity): 27 ng/L — ABNORMAL HIGH (ref <18)

## 2021-01-11 LAB — MAGNESIUM: Magnesium: 1.9 mg/dL (ref 1.7–2.4)

## 2021-01-11 MED ORDER — LISINOPRIL 5 MG PO TABS
5.0000 mg | ORAL_TABLET | Freq: Once | ORAL | Status: AC
  Administered 2021-01-11: 5 mg via ORAL
  Filled 2021-01-11: qty 1

## 2021-01-11 MED ORDER — LISINOPRIL-HYDROCHLOROTHIAZIDE 20-12.5 MG PO TABS: 1.0000 | ORAL_TABLET | Freq: Every day | ORAL | 0 refills | Status: DC

## 2021-01-11 MED ORDER — POTASSIUM CHLORIDE CRYS ER 20 MEQ PO TBCR
40.0000 meq | EXTENDED_RELEASE_TABLET | Freq: Once | ORAL | Status: AC
  Administered 2021-01-11: 40 meq via ORAL
  Filled 2021-01-11: qty 2

## 2021-01-11 NOTE — ED Notes (Signed)
255/83 right arm, 216/59 left arm

## 2021-01-11 NOTE — ED Provider Notes (Signed)
[facility] Emergency Department Provider Note  ____________________________________________   Event Date/Time   First MD Initiated Contact with Patient 01/11/21 1455     (approximate)  I have reviewed the triage vital signs and the nursing notes.   HISTORY  Chief Complaint Hypertension   HPI Lindsey Pollard is a 80 y.o. female patient presents with a history of DM, HTN, HDL, CAD, carotid stenosis, PVD, and CKD for assessment of EMS after referred to the ED by PCP due to persistent elevated blood pressures.  Patient states he was taken off her blood pressure medicines a couple weeks ago due to some dizziness.  She states she does not currently have any symptoms including headache, vertigo, vision changes, chest pain, cough, shortness of breath, back pain, dizziness, numbness, tingling, weakness or any recent fevers, nausea, vomiting, [place] dysuria, recent falls or injuries or any other complaints.  She denies any other changes to her medicines.  Denies any other acute concerns at this time.         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   PVD (peripheral vascular disease) (HCC) 06/14/2017   Morbid obesity due to excess calories (HCC) 06/07/2015   Pseudophakia of right eye 11/10/2014   Bilateral hearing loss 10/05/2014   CAD (coronary artery disease) 10/05/2014   GERD (gastroesophageal reflux disease) 10/05/2014   RLS (restless legs syndrome) 09/09/2014   Mixed hyperlipidemia 08/27/2014    Type 2 diabetes mellitus with stage 3 chronic kidney disease (HCC) 08/27/2014    Past Surgical History:  Procedure Laterality Date   CARDIAC SURGERY     bypass   EYE SURGERY     cataract extraction    Prior to Admission medications   Medication Sig Start Date End Date Taking? Authorizing Provider  aspirin 81 MG EC tablet Take by mouth.    [provider]  atorvastatin (LIPITOR) 80 MG tablet TAKE ONE TABLET BY MOUTH ONCE NIGHTLY 07/06/20   [provider]  baclofen (LIORESAL) 10 MG tablet Take 10 mg by mouth 3 (three) times daily. 10/01/20   [provider]  ergocalciferol (VITAMIN D2) 1.25 MG (50000 UT) capsule Take by mouth. 05/11/20   [provider]  furosemide (LASIX) 40 MG tablet Take 40 mg by mouth daily. Patient not taking: Reported on 10/20/2020 07/09/20   [provider]  insulin glargine (LANTUS) 100 UNIT/ML injection Inject 8 Units into the skin at bedtime. Per sliding scale 05/11/20   [provider]  insulin lispro (HUMALOG) 100 UNIT/ML KwikPen USE PER SLIDING SCALE UP TO 10 UNITS 3 TIMES DAILY (  MAXIMUM DAILY DOSE: 30  UNITS) 07/21/20   [provider]  Insulin Pen Needle (B-D UF III MINI PEN NEEDLES) 31G X 5 MM MISC See admin instructions. 05/11/20   [provider]  lisinopril-hydrochlorothiazide (ZESTORETIC) 20-12.5 MG tablet Take 1 tablet by mouth daily. 01/11/21 02/10/21  Gilles Chiquito, MD  metoprolol succinate (TOPROL-XL) 100 MG 24 hr tablet Take 1 tablet by mouth daily. 07/29/20  [provider]  nitroGLYCERIN (NITROSTAT) 0.4 MG SL tablet DISSOLVE 1 TABLET UNDER THE TONGUE EVERY 5 MINUTES AS  NEEDED FOR CHEST PAIN. MAX  OF 3 TABLETS IN 15 MINUTES. CALL 911 IF PAIN PERSISTS. 09/01/20   [provider]  pramipexole (MIRAPEX) 0.25 MG tablet Take 1 tablet by mouth 3 (three) times daily. 07/20/20   [provider]  senna (SENOKOT) 8.6 MG tablet Take 2 tablets by mouth at bedtime.     [provider]  torsemide (DEMADEX) 10 MG tablet Take 10 mg by mouth daily. 09/20/20   [provider]    Allergies Other, Tetanus toxoids, and Penicillin g  Family History  Problem Relation Age of Onset   Congestive Heart Failure Mother    Diabetes Father    Breast cancer Neg Hx     Social History Social History   Tobacco Use   Smoking status: Former    Types: Cigarettes   Smokeless tobacco: Never  Substance Use Topics   Alcohol use: Not Currently   Drug use: Not Currently    Review of Systems  Review of Systems  Constitutional:  Negative for chills and fever.  HENT:  Negative for sore throat.   Eyes:  Negative for pain.  Respiratory:  Negative for cough and stridor.   Cardiovascular:  Negative for chest pain.  Gastrointestinal:  Negative for vomiting.  Genitourinary:  Negative for dysuria.  Musculoskeletal:  Negative for myalgias.  Skin:  Negative for rash.  Neurological:  Negative for seizures, loss of consciousness and headaches.  Psychiatric/Behavioral:  Negative for suicidal ideas.   All other systems reviewed and are negative.    ____________________________________________   PHYSICAL EXAM:  VITAL SIGNS: ED Triage Vitals  Enc Vitals Group     BP 01/11/21 1211 (!) 255/83     Pulse Rate 01/11/21 1211 74     Resp 01/11/21 1211 18     Temp 01/11/21 1211 98.9 F (37.2 C)     Temp Source 01/11/21 1211 Oral     SpO2 01/11/21 1211 100 %     Weight 01/11/21 1212 130 lb (59 kg)     Height 01/11/21 1212 4\' 11"  (1.499 m)     Head Circumference --      Peak Flow --      Pain Score 01/11/21 1211 0     Pain Loc --      Pain Edu? --      Excl. in GC? --    Vitals:   01/11/21 1730 01/11/21 1800  BP: (!) 229/75 (!) 220/62  Pulse: 84 85  Resp:  16  Temp:    SpO2: 97% 98%   Physical Exam Vitals and nursing note reviewed.  Constitutional:      General: She is not in acute distress.    Appearance: She is well-developed.  HENT:      Head: Normocephalic and atraumatic.     Right Ear: External ear normal.     Left Ear: External ear normal.     Nose: Nose normal.  Eyes:     Conjunctiva/sclera: Conjunctivae normal.  Cardiovascular:     Rate and Rhythm: Normal rate and regular rhythm.     Heart sounds: No murmur heard. Pulmonary:     Effort: Pulmonary effort is normal. No respiratory distress.     Breath sounds: Normal breath sounds.  Abdominal:     Palpations: Abdomen is soft.     Tenderness: There is no abdominal tenderness.  Musculoskeletal:  Cervical back: Neck supple.  Skin:    General: Skin is warm and dry.  Neurological:     Mental Status: She is alert.    Cranial nerves II through XII grossly intact.  No pronator drift.  No finger dysmetria.  Symmetric 5/5 strength of all extremities.  Sensation intact to light touch in all extremities.  Unremarkable unassisted gait.  ____________________________________________   LABS (all labs ordered are listed, but only abnormal results are displayed)  Labs Reviewed  CBC - Abnormal; Notable for the following components:      Result Value   RBC 3.66 (*)    Hemoglobin 11.1 (*)    HCT 33.6 (*)    All other components within normal limits  COMPREHENSIVE METABOLIC PANEL - Abnormal; Notable for the following components:   Potassium 2.8 (*)    Alkaline Phosphatase 142 (*)    All other components within normal limits  TROPONIN I (HIGH SENSITIVITY) - Abnormal; Notable for the following components:   Troponin I (High Sensitivity) 27 (*)    All other components within normal limits  TROPONIN I (HIGH SENSITIVITY) - Abnormal; Notable for the following components:   Troponin I (High Sensitivity) 27 (*)    All other components within normal limits  MAGNESIUM   ____________________________________________  EKG  Sinus rhythm with incomplete right bundle branch block and left anterior fascicle block with evidence of Q waves in lead III and some nonspecific changes in  anterior leads. ____________________________________________  RADIOLOGY  ED MD interpretation:    Official radiology report(s): No results found.  ____________________________________________   PROCEDURES  Procedure(s) performed (including Critical Care):  .1-3 Lead EKG Interpretation  Date/Time: 01/11/2021 7:43 PM Performed by: Gilles Chiquito, MD Authorized by: Gilles Chiquito, MD     Interpretation: non-specific     ECG rate assessment: normal     Rhythm: sinus rhythm     Ectopy: none     Conduction: abnormal     ____________________________________________   INITIAL IMPRESSION / ASSESSMENT AND PLAN / ED COURSE        Patient presents with above to history exam for assessment of elevated blood pressures seen at routine home visit referred by PCP.  Patient denies any symptoms on arrival.  On arrival she is hypertensive with a BP of 155/83 with otherwise stable vital signs on room air.  She has no focal deficits or headache to suggest CVA or SAH.  She denies any chest pain or shortness of breath is symmetric upper extremity pulses and I have a low suspicion for dissection.  CBC shows no leukocytosis and hemoglobin is 11.1.  CMP remarkable for K of 2.8 without any other significant electrode or metabolic derangements.  Given nonspecific findings on ECG with initial troponin of 27 advised patient to recommend she stay for observation to at least trend repeat troponin.  However patient states she was to leave before this resulted as she did not have any symptoms and was to follow-up with her PCP.  Advised that I cannot rule out hypertensive emergency causing severe damage to her heart potentially requiring additional antihypertensives but she states she understood this and still wished to leave.  I think she had capacity make decision and was discharged against my advice prior to repeat troponin resulted.  I did give her a refill of her previously prescribed Restoril and 1 dose  of emergency room with repeat blood pressure of 220/62.        ____________________________________________   FINAL CLINICAL  IMPRESSION(S) / ED DIAGNOSES  Final diagnoses:  Hypertension, unspecified type  Hypokalemia    Medications  lisinopril (ZESTRIL) tablet 5 mg (5 mg Oral Given 01/11/21 1552)  potassium chloride SA (KLOR-CON) CR tablet 40 mEq (40 mEq Oral Given 01/11/21 1552)     ED Discharge Orders          Ordered    lisinopril-hydrochlorothiazide (ZESTORETIC) 20-12.5 MG tablet  Daily        01/11/21 1810             Note:  This document was prepared using Dragon voice recognition software and may include unintentional dictation errors.    Gilles Chiquito, MD 01/11/21 [phone]

## 2021-01-11 NOTE — ED Triage Notes (Signed)
First Nurse Note:  BP taken during a routine home visit -- 260/108.  Arrives via EMS, BP:  282/54 in right arm.  Patient has no complaint.

## 2021-01-11 NOTE — ED Triage Notes (Signed)
Pt comes EMS with HTN. Denies any symptoms. States taking meds like normal. Aox4. HOH.

## 2021-06-01 ENCOUNTER — Inpatient Hospital Stay
Admission: EM | Admit: 2021-06-01 | Discharge: 2021-06-03 | DRG: 280 | Disposition: A | Discharge status: Home / Self Care | Payer: Medicare Other | Attending: Internal Medicine | Admitting: Internal Medicine

## 2021-06-01 ENCOUNTER — Other Ambulatory Visit: Payer: Self-pay

## 2021-06-01 ENCOUNTER — Emergency Department: Payer: Medicare Other

## 2021-06-01 ENCOUNTER — Inpatient Hospital Stay
Admit: 2021-06-01 | Discharge: 2021-06-01 | Disposition: A | Discharge status: Home / Self Care | Payer: Medicare Other | Attending: Internal Medicine | Admitting: Internal Medicine

## 2021-06-01 ENCOUNTER — Encounter
Admission: EM | Disposition: A | Discharge status: Home / Self Care | Payer: Self-pay | Source: Home / Self Care | Attending: Internal Medicine

## 2021-06-01 DIAGNOSIS — N179 Acute kidney failure, unspecified: Secondary | ICD-10-CM

## 2021-06-01 DIAGNOSIS — Z20822 Contact with and (suspected) exposure to covid-19: Secondary | ICD-10-CM | POA: Diagnosis present

## 2021-06-01 DIAGNOSIS — I10 Essential (primary) hypertension: Secondary | ICD-10-CM | POA: Diagnosis present

## 2021-06-01 DIAGNOSIS — J9601 Acute respiratory failure with hypoxia: Secondary | ICD-10-CM | POA: Diagnosis present

## 2021-06-01 DIAGNOSIS — E669 Obesity, unspecified: Secondary | ICD-10-CM | POA: Diagnosis present

## 2021-06-01 DIAGNOSIS — E1169 Type 2 diabetes mellitus with other specified complication: Secondary | ICD-10-CM

## 2021-06-01 DIAGNOSIS — I214 Non-ST elevation (NSTEMI) myocardial infarction: Secondary | ICD-10-CM | POA: Diagnosis present

## 2021-06-01 DIAGNOSIS — E1151 Type 2 diabetes mellitus with diabetic peripheral angiopathy without gangrene: Secondary | ICD-10-CM | POA: Diagnosis present

## 2021-06-01 DIAGNOSIS — H9193 Unspecified hearing loss, bilateral: Secondary | ICD-10-CM | POA: Diagnosis present

## 2021-06-01 DIAGNOSIS — Z794 Long term (current) use of insulin: Secondary | ICD-10-CM

## 2021-06-01 DIAGNOSIS — I213 ST elevation (STEMI) myocardial infarction of unspecified site: Secondary | ICD-10-CM

## 2021-06-01 DIAGNOSIS — I130 Hypertensive heart and chronic kidney disease with heart failure and stage 1 through stage 4 chronic kidney disease, or unspecified chronic kidney disease: Secondary | ICD-10-CM | POA: Diagnosis present

## 2021-06-01 DIAGNOSIS — Z7982 Long term (current) use of aspirin: Secondary | ICD-10-CM

## 2021-06-01 DIAGNOSIS — D649 Anemia, unspecified: Secondary | ICD-10-CM

## 2021-06-01 DIAGNOSIS — Z87891 Personal history of nicotine dependence: Secondary | ICD-10-CM

## 2021-06-01 DIAGNOSIS — J810 Acute pulmonary edema: Secondary | ICD-10-CM

## 2021-06-01 DIAGNOSIS — B369 Superficial mycosis, unspecified: Secondary | ICD-10-CM

## 2021-06-01 DIAGNOSIS — E785 Hyperlipidemia, unspecified: Secondary | ICD-10-CM | POA: Diagnosis present

## 2021-06-01 DIAGNOSIS — I255 Ischemic cardiomyopathy: Secondary | ICD-10-CM | POA: Diagnosis present

## 2021-06-01 DIAGNOSIS — I252 Old myocardial infarction: Secondary | ICD-10-CM | POA: Diagnosis not present

## 2021-06-01 DIAGNOSIS — I2510 Atherosclerotic heart disease of native coronary artery without angina pectoris: Secondary | ICD-10-CM | POA: Diagnosis present

## 2021-06-01 DIAGNOSIS — Z66 Do not resuscitate: Secondary | ICD-10-CM | POA: Diagnosis present

## 2021-06-01 DIAGNOSIS — Z888 Allergy status to other drugs, medicaments and biological substances status: Secondary | ICD-10-CM

## 2021-06-01 DIAGNOSIS — B356 Tinea cruris: Secondary | ICD-10-CM

## 2021-06-01 DIAGNOSIS — J9621 Acute and chronic respiratory failure with hypoxia: Secondary | ICD-10-CM

## 2021-06-01 DIAGNOSIS — I5032 Chronic diastolic (congestive) heart failure: Secondary | ICD-10-CM

## 2021-06-01 DIAGNOSIS — N183 Chronic kidney disease, stage 3 unspecified: Secondary | ICD-10-CM | POA: Diagnosis present

## 2021-06-01 DIAGNOSIS — Z6829 Body mass index (BMI) 29.0-29.9, adult: Secondary | ICD-10-CM

## 2021-06-01 DIAGNOSIS — Z23 Encounter for immunization: Secondary | ICD-10-CM | POA: Diagnosis present

## 2021-06-01 DIAGNOSIS — E1122 Type 2 diabetes mellitus with diabetic chronic kidney disease: Secondary | ICD-10-CM | POA: Diagnosis present

## 2021-06-01 DIAGNOSIS — Z79899 Other long term (current) drug therapy: Secondary | ICD-10-CM

## 2021-06-01 DIAGNOSIS — Z880 Allergy status to penicillin: Secondary | ICD-10-CM

## 2021-06-01 DIAGNOSIS — I700 Atherosclerosis of aorta: Secondary | ICD-10-CM | POA: Diagnosis present

## 2021-06-01 DIAGNOSIS — I739 Peripheral vascular disease, unspecified: Secondary | ICD-10-CM | POA: Diagnosis present

## 2021-06-01 DIAGNOSIS — D631 Anemia in chronic kidney disease: Secondary | ICD-10-CM | POA: Diagnosis present

## 2021-06-01 DIAGNOSIS — K219 Gastro-esophageal reflux disease without esophagitis: Secondary | ICD-10-CM | POA: Diagnosis present

## 2021-06-01 DIAGNOSIS — Z8249 Family history of ischemic heart disease and other diseases of the circulatory system: Secondary | ICD-10-CM

## 2021-06-01 DIAGNOSIS — Z951 Presence of aortocoronary bypass graft: Secondary | ICD-10-CM

## 2021-06-01 HISTORY — PX: LEFT HEART CATH AND CORONARY ANGIOGRAPHY: CATH118249

## 2021-06-01 HISTORY — PX: CORONARY/GRAFT ACUTE MI REVASCULARIZATION: CATH118305

## 2021-06-01 LAB — PROTIME-INR
INR: 1 (ref 0.8–1.2)
Prothrombin Time: 12.8 seconds (ref 11.4–15.2)

## 2021-06-01 LAB — GLUCOSE, CAPILLARY
Glucose-Capillary: 180 mg/dL — ABNORMAL HIGH (ref 70–99)
Glucose-Capillary: 192 mg/dL — ABNORMAL HIGH (ref 70–99)
Glucose-Capillary: 83 mg/dL (ref 70–99)
Glucose-Capillary: 91 mg/dL (ref 70–99)

## 2021-06-01 LAB — COMPREHENSIVE METABOLIC PANEL
ALT: 13 U/L (ref 0–44)
AST: 18 U/L (ref 15–41)
Albumin: 4 g/dL (ref 3.5–5.0)
Alkaline Phosphatase: 135 U/L — ABNORMAL HIGH (ref 38–126)
Anion gap: 9 (ref 5–15)
BUN: 29 mg/dL — ABNORMAL HIGH (ref 8–23)
CO2: 22 mmol/L (ref 22–32)
Calcium: 9.2 mg/dL (ref 8.9–10.3)
Chloride: 105 mmol/L (ref 98–111)
Creatinine, Ser: 0.88 mg/dL (ref 0.44–1.00)
GFR, Estimated: >60 mL/min (ref >60)
Glucose, Bld: 173 mg/dL — ABNORMAL HIGH (ref 70–99)
Potassium: 5.2 mmol/L — ABNORMAL HIGH (ref 3.5–5.1)
Sodium: 136 mmol/L (ref 135–145)
Total Bilirubin: 1.1 mg/dL (ref 0.3–1.2)
Total Protein: 7.1 g/dL (ref 6.5–8.1)

## 2021-06-01 LAB — CBC WITH DIFFERENTIAL/PLATELET
Abs Immature Granulocytes: 0.02 10*3/uL (ref 0.00–0.07)
Basophils Absolute: 0.1 10*3/uL (ref 0.0–0.1)
Basophils Relative: 1 %
Eosinophils Absolute: 0.3 10*3/uL (ref 0.0–0.5)
Eosinophils Relative: 2 %
HCT: 36.3 % (ref 36.0–46.0)
Hemoglobin: 11.7 g/dL — ABNORMAL LOW (ref 12.0–15.0)
Immature Granulocytes: 0 %
Lymphocytes Relative: 33 %
Lymphs Abs: 3.7 10*3/uL (ref 0.7–4.0)
MCH: 31.5 pg (ref 26.0–34.0)
MCHC: 32.2 g/dL (ref 30.0–36.0)
MCV: 97.6 fL (ref 80.0–100.0)
Monocytes Absolute: 1.2 10*3/uL — ABNORMAL HIGH (ref 0.1–1.0)
Monocytes Relative: 10 %
Neutro Abs: 6 10*3/uL (ref 1.7–7.7)
Neutrophils Relative %: 54 %
Platelets: 262 10*3/uL (ref 150–400)
RBC: 3.72 MIL/uL — ABNORMAL LOW (ref 3.87–5.11)
RDW: 13.7 % (ref 11.5–15.5)
WBC: 11.2 10*3/uL — ABNORMAL HIGH (ref 4.0–10.5)
nRBC: 0 % (ref 0.0–0.2)

## 2021-06-01 LAB — ECHOCARDIOGRAM COMPLETE
AR max vel: 1.29 cm2
AV Area VTI: 1.21 cm2
AV Area mean vel: 1.05 cm2
AV Mean grad: 9.8 mmHg
AV Peak grad: 16.8 mmHg
Ao pk vel: 2.05 m/s
Area-P 1/2: 2.42 cm2
Height: 59 in
MV VTI: 2.23 cm2
P 1/2 time: 394 msec
S' Lateral: 2.9 cm
Weight: 2336 oz

## 2021-06-01 LAB — TROPONIN I (HIGH SENSITIVITY)
Troponin I (High Sensitivity): 15 ng/L (ref <18)
Troponin I (High Sensitivity): 2670 ng/L (ref <18)

## 2021-06-01 LAB — LIPID PANEL
Cholesterol: 127 mg/dL (ref 0–200)
HDL: 47 mg/dL (ref >40)
LDL Cholesterol: 49 mg/dL (ref 0–99)
Total CHOL/HDL Ratio: 2.7 RATIO
Triglycerides: 154 mg/dL — ABNORMAL HIGH (ref <150)
VLDL: 31 mg/dL (ref 0–40)

## 2021-06-01 LAB — MAGNESIUM: Magnesium: 2.8 mg/dL — ABNORMAL HIGH (ref 1.7–2.4)

## 2021-06-01 LAB — HEPARIN LEVEL (UNFRACTIONATED): Heparin Unfractionated: 0.27 IU/mL — ABNORMAL LOW (ref 0.30–0.70)

## 2021-06-01 LAB — RESP PANEL BY RT-PCR (FLU A&B, COVID) ARPGX2
Influenza A by PCR: NEGATIVE
Influenza B by PCR: NEGATIVE
SARS Coronavirus 2 by RT PCR: NEGATIVE

## 2021-06-01 LAB — MRSA NEXT GEN BY PCR, NASAL: MRSA by PCR Next Gen: NOT DETECTED

## 2021-06-01 LAB — APTT: aPTT: 32 seconds (ref 24–36)

## 2021-06-01 LAB — POCT ACTIVATED CLOTTING TIME: Activated Clotting Time: 197 seconds

## 2021-06-01 LAB — BRAIN NATRIURETIC PEPTIDE: B Natriuretic Peptide: 409.6 pg/mL — ABNORMAL HIGH (ref 0.0–100.0)

## 2021-06-01 IMAGING — DX DG CHEST 1V PORT
1 series · 1 of 1 positions shown · non-contrast
Comparison: No priors.

CLINICAL DATA: 80-year-old female with history of shortness of
breath. Severe chest pain.

EXAM:
PORTABLE CHEST 1 VIEW

[chest ap]
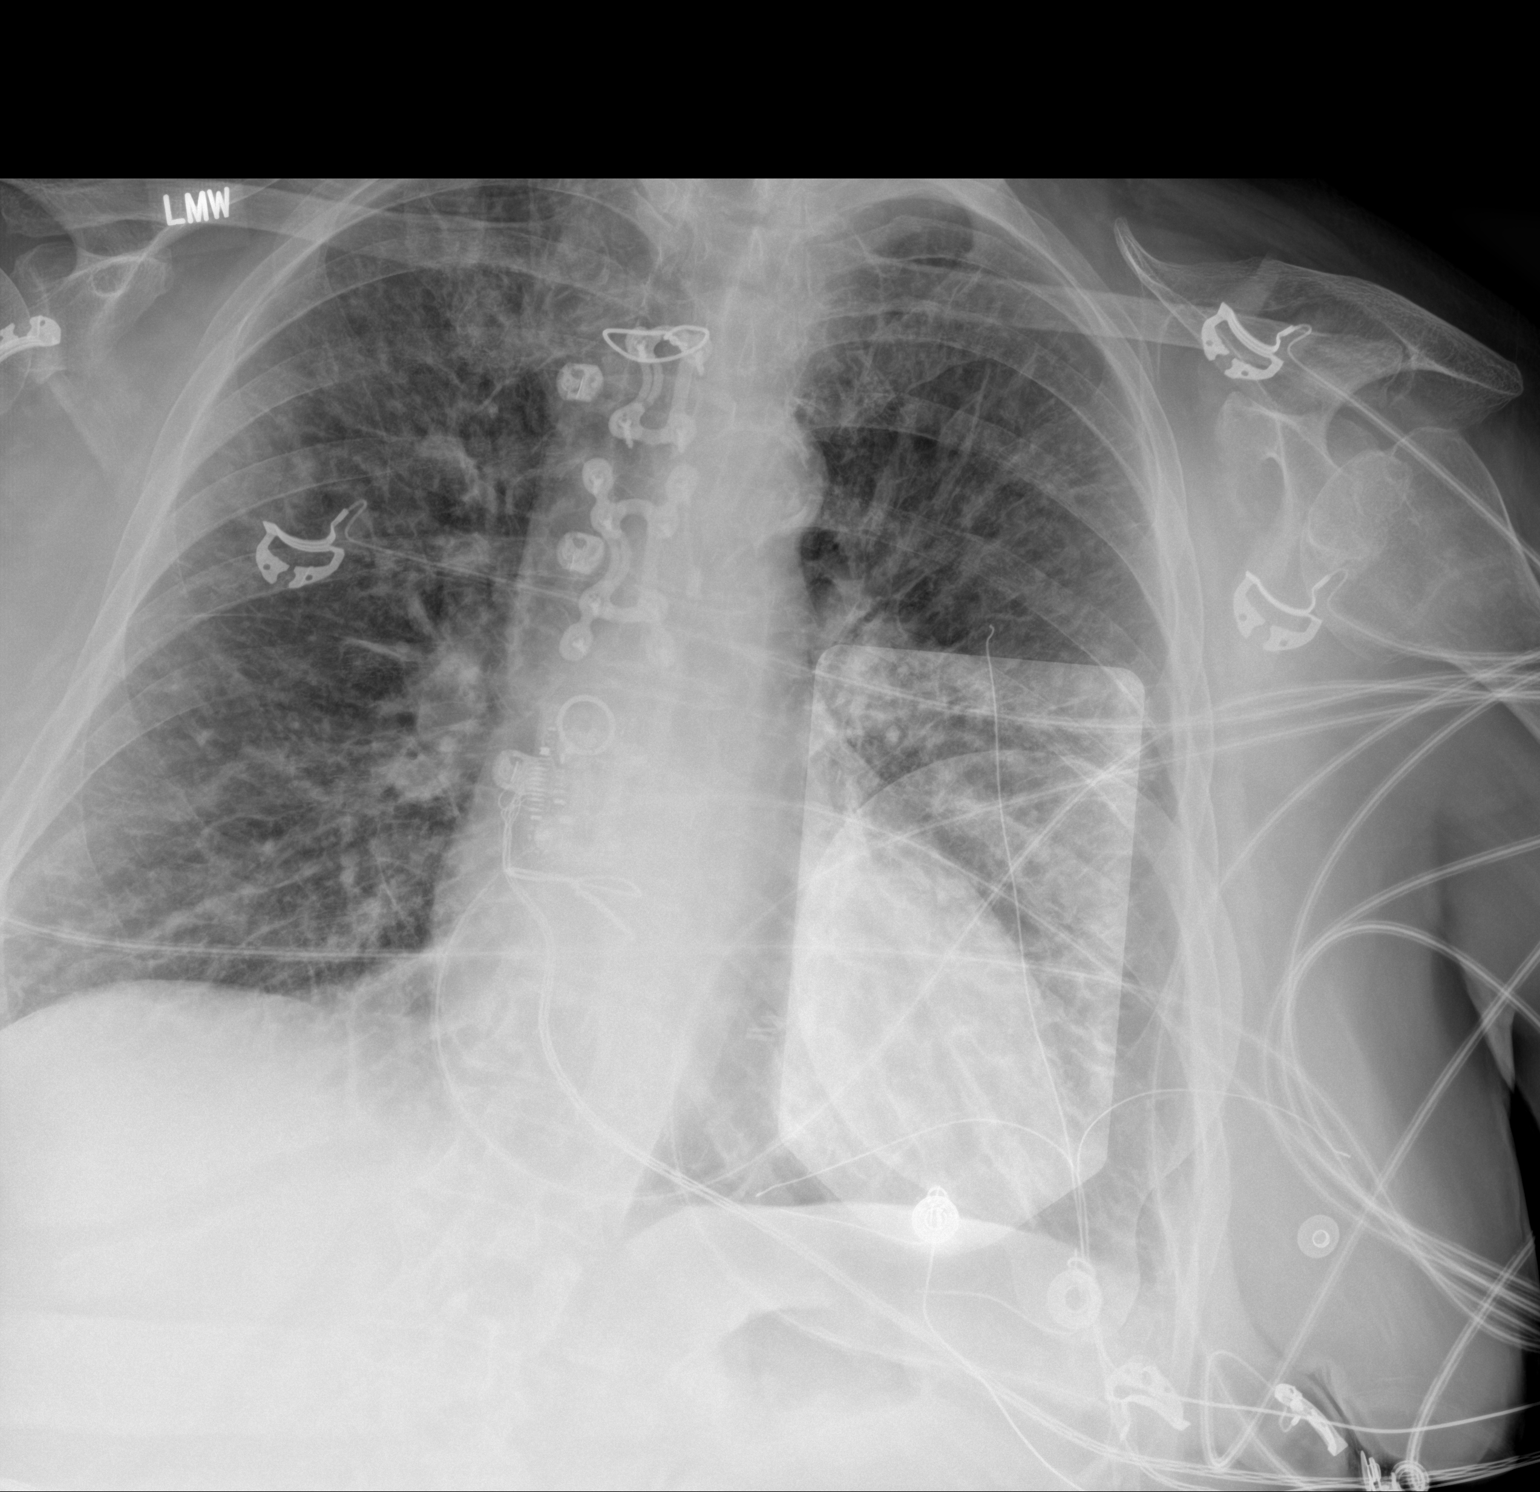

[1 of 1 positions shown; findings below may reference images not displayed]

FINDINGS: Transcutaneous defibrillator pads projecting over the left
hemithorax. There is cephalization of the pulmonary vasculature and
slight indistinctness of the interstitial markings suggestive of
mild pulmonary edema. No definite pleural effusions. No
pneumothorax. Heart size appears borderline enlarged. Upper
mediastinal contours are within normal limits. Atherosclerotic
calcifications are noted in the thoracic aorta. Orthopedic fixation
hardware projecting over the sternum. Upper mediastinal scratch the
upper median sternotomy wire. Vascular marker from prior CABG.
IMPRESSION: 1. The appearance of the chest may suggest developing congestive
heart failure, as above.
2. Aortic atherosclerosis.

## 2021-06-01 SURGERY — CORONARY/GRAFT ACUTE MI REVASCULARIZATION
Anesthesia: Moderate Sedation

## 2021-06-01 MED ORDER — HEPARIN SODIUM (PORCINE) 5000 UNIT/ML IJ SOLN: 60.0000 [IU]/kg | Freq: Once | INTRAMUSCULAR | Status: AC

## 2021-06-01 MED ORDER — IOHEXOL 350 MG/ML SOLN
INTRAVENOUS | Status: DC | PRN
  Administered 2021-06-01: 150 mL

## 2021-06-01 MED ORDER — HEPARIN (PORCINE) 25000 UT/250ML-% IV SOLN
INTRAVENOUS | Status: AC | PRN
  Administered 2021-06-01: 900 [IU]/h via INTRAVENOUS

## 2021-06-01 MED ORDER — LIDOCAINE HCL 1 % IJ SOLN
INTRAMUSCULAR | Status: AC
  Filled 2021-06-01: qty 20

## 2021-06-01 MED ORDER — HEPARIN (PORCINE) 25000 UT/250ML-% IV SOLN
950.0000 [IU]/h | INTRAVENOUS | Status: DC
  Administered 2021-06-02: 950 [IU]/h via INTRAVENOUS
  Filled 2021-06-01: qty 250

## 2021-06-01 MED ORDER — LIDOCAINE HCL (PF) 1 % IJ SOLN
INTRAMUSCULAR | Status: DC | PRN
  Administered 2021-06-01: 20 mL

## 2021-06-01 MED ORDER — HEPARIN (PORCINE) 25000 UT/250ML-% IV SOLN
700.0000 [IU]/h | INTRAVENOUS | Status: DC
Start: 2021-06-01 — End: 2021-06-01

## 2021-06-01 MED ORDER — METOPROLOL SUCCINATE ER 50 MG PO TB24
50.0000 mg | ORAL_TABLET | Freq: Every day | ORAL | Status: DC
  Administered 2021-06-02 – 2021-06-03 (×2): 50 mg via ORAL
  Filled 2021-06-01 (×2): qty 1

## 2021-06-01 MED ORDER — ONDANSETRON HCL 4 MG/2ML IJ SOLN: 4.0000 mg | Freq: Four times a day (QID) | INTRAMUSCULAR | Status: DC | PRN

## 2021-06-01 MED ORDER — FUROSEMIDE 10 MG/ML IJ SOLN
INTRAMUSCULAR | Status: DC | PRN
  Administered 2021-06-01: 40 mg via INTRAVENOUS

## 2021-06-01 MED ORDER — ASPIRIN EC 81 MG PO TBEC
81.0000 mg | DELAYED_RELEASE_TABLET | Freq: Every day | ORAL | Status: DC
  Administered 2021-06-02 – 2021-06-03 (×2): 81 mg via ORAL
  Filled 2021-06-01 (×2): qty 1

## 2021-06-01 MED ORDER — HYDRALAZINE HCL 20 MG/ML IJ SOLN: 10.0000 mg | INTRAMUSCULAR | Status: AC | PRN

## 2021-06-01 MED ORDER — CHLORHEXIDINE GLUCONATE CLOTH 2 % EX PADS
6.0000 | MEDICATED_PAD | Freq: Every day | CUTANEOUS | Status: DC
  Administered 2021-06-02 – 2021-06-03 (×2): 6 via TOPICAL

## 2021-06-01 MED ORDER — ACETAMINOPHEN 325 MG PO TABS: 650.0000 mg | ORAL_TABLET | ORAL | Status: DC | PRN

## 2021-06-01 MED ORDER — INSULIN ASPART 100 UNIT/ML IJ SOLN
0.0000 [IU] | Freq: Three times a day (TID) | INTRAMUSCULAR | Status: DC
  Administered 2021-06-01 – 2021-06-02 (×3): 3 [IU] via SUBCUTANEOUS
  Filled 2021-06-01: qty 1

## 2021-06-01 MED ORDER — HEPARIN (PORCINE) IN NACL 1000-0.9 UT/500ML-% IV SOLN
INTRAVENOUS | Status: DC | PRN
  Administered 2021-06-01 (×2): 500 mL

## 2021-06-01 MED ORDER — PNEUMOCOCCAL VAC POLYVALENT 25 MCG/0.5ML IJ INJ
0.5000 mL | INJECTION | INTRAMUSCULAR | Status: DC
  Filled 2021-06-01: qty 0.5

## 2021-06-01 MED ORDER — NITROGLYCERIN IN D5W 200-5 MCG/ML-% IV SOLN
INTRAVENOUS | Status: AC | PRN
  Administered 2021-06-01: 10 ug/min via INTRAVENOUS

## 2021-06-01 MED ORDER — NITROGLYCERIN IN D5W 200-5 MCG/ML-% IV SOLN: 0.0000 ug/min | INTRAVENOUS | Status: DC

## 2021-06-01 MED ORDER — INSULIN ASPART 100 UNIT/ML IJ SOLN: 0.0000 [IU] | Freq: Every day | INTRAMUSCULAR | Status: DC

## 2021-06-01 MED ORDER — ONDANSETRON HCL 4 MG/2ML IJ SOLN
4.0000 mg | Freq: Once | INTRAMUSCULAR | Status: AC
  Administered 2021-06-01: 4 mg via INTRAVENOUS
  Filled 2021-06-01: qty 2

## 2021-06-01 MED ORDER — NITROGLYCERIN IN D5W 200-5 MCG/ML-% IV SOLN
INTRAVENOUS | Status: AC
  Filled 2021-06-01: qty 250

## 2021-06-01 MED ORDER — ASPIRIN 81 MG PO CHEW: 81.0000 mg | CHEWABLE_TABLET | Freq: Every day | ORAL | Status: DC

## 2021-06-01 MED ORDER — MORPHINE SULFATE (PF) 4 MG/ML IV SOLN
4.0000 mg | Freq: Once | INTRAVENOUS | Status: AC
  Administered 2021-06-01: 4 mg via INTRAVENOUS
  Filled 2021-06-01: qty 1

## 2021-06-01 MED ORDER — SODIUM CHLORIDE 0.9% FLUSH
3.0000 mL | Freq: Two times a day (BID) | INTRAVENOUS | Status: DC
  Administered 2021-06-01 – 2021-06-03 (×4): 3 mL via INTRAVENOUS

## 2021-06-01 MED ORDER — METOPROLOL TARTRATE 5 MG/5ML IV SOLN
INTRAVENOUS | Status: AC
  Filled 2021-06-01: qty 5

## 2021-06-01 MED ORDER — LABETALOL HCL 5 MG/ML IV SOLN: 10.0000 mg | INTRAVENOUS | Status: AC | PRN

## 2021-06-01 MED ORDER — HEPARIN BOLUS VIA INFUSION
800.0000 [IU] | Freq: Once | INTRAVENOUS | Status: AC
  Administered 2021-06-01: 800 [IU] via INTRAVENOUS
  Filled 2021-06-01: qty 800

## 2021-06-01 MED ORDER — ATORVASTATIN CALCIUM 20 MG PO TABS
80.0000 mg | ORAL_TABLET | Freq: Every day | ORAL | Status: DC
  Administered 2021-06-01 – 2021-06-03 (×3): 80 mg via ORAL
  Filled 2021-06-01 (×3): qty 4

## 2021-06-01 MED ORDER — TORSEMIDE 20 MG PO TABS
20.0000 mg | ORAL_TABLET | Freq: Every day | ORAL | Status: DC
  Administered 2021-06-02: 20 mg via ORAL
  Filled 2021-06-01: qty 1

## 2021-06-01 MED ORDER — FUROSEMIDE 10 MG/ML IJ SOLN
INTRAMUSCULAR | Status: AC
  Filled 2021-06-01: qty 4

## 2021-06-01 MED ORDER — HEPARIN (PORCINE) 25000 UT/250ML-% IV SOLN
INTRAVENOUS | Status: AC
  Filled 2021-06-01: qty 250

## 2021-06-01 MED ORDER — SODIUM CHLORIDE 0.9 % IV SOLN: INTRAVENOUS | Status: AC

## 2021-06-01 MED ORDER — SODIUM CHLORIDE 0.9% FLUSH: 3.0000 mL | INTRAVENOUS | Status: DC | PRN

## 2021-06-01 MED ORDER — METOPROLOL TARTRATE 5 MG/5ML IV SOLN
INTRAVENOUS | Status: DC | PRN
  Administered 2021-06-01: 5 mg via INTRAVENOUS

## 2021-06-01 MED ORDER — FUROSEMIDE 10 MG/ML IJ SOLN
40.0000 mg | Freq: Once | INTRAMUSCULAR | Status: AC
  Administered 2021-06-01: 40 mg via INTRAVENOUS
  Filled 2021-06-01: qty 4

## 2021-06-01 MED ORDER — SODIUM CHLORIDE 0.9 % IV SOLN: 250.0000 mL | INTRAVENOUS | Status: DC | PRN

## 2021-06-01 SURGICAL SUPPLY — 13 items
CATH INFINITI 5 FR IM (CATHETERS) ×2 IMPLANT
CATH INFINITI 5FR MULTPACK ANG (CATHETERS) ×2 IMPLANT
DRAPE BRACHIAL (DRAPES) ×2 IMPLANT
KIT SYRINGE INJ CVI SPIKEX1 (MISCELLANEOUS) ×2 IMPLANT
NEEDLE PERC 18GX7CM (NEEDLE) ×2 IMPLANT
PACK CARDIAC CATH (CUSTOM PROCEDURE TRAY) ×2 IMPLANT
PANNUS RETENTION SYSTEM 2 PAD (MISCELLANEOUS) ×2 IMPLANT
PROTECTION STATION PRESSURIZED (MISCELLANEOUS) ×2
SET ATX SIMPLICITY (MISCELLANEOUS) ×2 IMPLANT
SHEATH AVANTI 6FR X 11CM (SHEATH) ×2 IMPLANT
STATION PROTECTION PRESSURIZED (MISCELLANEOUS) ×1 IMPLANT
TUBING CIL FLEX 10 FLL-RA (TUBING) ×2 IMPLANT
WIRE GUIDERIGHT .035X150 (WIRE) ×2 IMPLANT

## 2021-06-01 NOTE — Consult Note (Addendum)
ANTICOAGULATION CONSULT NOTE  Pharmacy Consult for IV Heparin Indication: chest pain/ACS  Patient Measurements: Height: 4\' 11"  (149.9 cm) Weight: 66.2 kg (146 lb) IBW/kg (Calculated) : 43.2 Heparin Dosing Weight: 57.7 kg  Labs: Recent Labs    06/01/21 0551  HGB 11.7*  HCT 36.3  PLT 262  APTT 32  LABPROT 12.8  INR 1.0  CREATININE 0.88  TROPONINIHS 15    Estimated Creatinine Clearance: 42.9 mL/min (by C-G formula based on SCr of 0.88 mg/dL).   Medications:  No anticoagulation prior to admission per my chart review  Assessment: Patient is a 80 y/o F with medical history including HTN, diabetes, CAD who presented to the ED 12/8 early AM with acute chest pain refractory to NTG that started around 2 AM. Patient taken emergently for cardiac catheterization. Pharmacy consulted to re-initiate heparin post cardiac catheterization.  Baseline CBC acceptable. Baseline aPTT and PT-INR within normal limits.  Goal of Therapy:  Heparin level 0.3-0.7 units/ml Monitor platelets by anticoagulation protocol: Yes   Plan:  --Start heparin at 700 units/hr with no bolus at 1200 today --Heparin level 8 hours after initiation of infusion --Daily CBC per protocol while on IV heparin  14/8 06/01/2021,8:21 AM

## 2021-06-01 NOTE — Consult Note (Signed)
ANTICOAGULATION CONSULT NOTE  Pharmacy Consult for IV Heparin Indication: chest pain/ACS  Patient Measurements: Height: 4\' 11"  (149.9 cm) Weight: 66.2 kg (145 lb 15.1 oz) IBW/kg (Calculated) : 43.2 Heparin Dosing Weight: 57.7 kg  Labs: Recent Labs    06/01/21 0551 06/01/21 0930 06/01/21 2037  HGB 11.7*  --   --   HCT 36.3  --   --   PLT 262  --   --   APTT 32  --   --   LABPROT 12.8  --   --   INR 1.0  --   --   HEPARINUNFRC  --   --  0.27*  CREATININE 0.88  --   --   TROPONINIHS 15 2,670*  --      Estimated Creatinine Clearance: 42.9 mL/min (by C-G formula based on SCr of 0.88 mg/dL).   Medications:  No anticoagulation prior to admission per my chart review  Assessment: Patient is a 80 y/o F with medical history including HTN, diabetes, CAD who presented to the ED 12/8 early AM with acute chest pain refractory to NTG that started around 2 AM. Patient taken emergently for cardiac catheterization. Pharmacy consulted to re-initiate heparin post cardiac catheterization.  Baseline CBC acceptable. Baseline aPTT and PT-INR within normal limits.  Goal of Therapy:  Heparin level 0.3-0.7 units/ml Monitor platelets by anticoagulation protocol: Yes   Plan:  --12/8@2055 : HL 0.27, subtherapeutic --Bolus 800 units and increase heparin infusion to 850 units/hr  --Recheck HL in 8 hours after rate change. --Daily CBC per protocol while on IV heparin  Cyree Chuong A Rhyker Silversmith 06/01/2021,9:41 PM

## 2021-06-01 NOTE — H&P (Addendum)
History and Physical    GENESHA Pollard [id number] DOB: 1940/06/26 DOA: 06/01/2021  PCP: [name], MD   Patient coming from: Home  I have personally briefly reviewed patient's old medical records in [place]  Chief Complaint: Chest pain  HPI: Lindsey Pollard is a 80 y.o. female with medical history significant for coronary artery disease status post CABG, diabetes mellitus, hypertension and dyslipidemia who presents to the ER by EMS as a code STEMI. Patient states that she woke up at about 2 AM with severe midsternal chest pain which she rated a 10 x 10 in intensity at its worst.  She states the pain felt similar to when she had her heart attack 2 years ago.  She took 2 sublingual nitroglycerin with some improvement in her pain and she was able to go back to sleep. At about 4 AM her pain recurred and she did not get any relief after she took 2 more sublingual nitroglycerin so she called EMS. When EMS arrived she was noted to have pulse oximetry of 86 to 87% on room air at rest and had increased work of breathing and so she was placed on a nonrebreather mask.  EMS gave 1 spray of nitroglycerin and transported her to the ER. She was placed on BiPAP transiently due to increased work of breathing but has been weaned down to nasal cannula. Code STEMI was activated and patient was taken emergently to the Cath Lab. ABG 7.3 2/41/103/21/90 7.4 Sodium 136, potassium 5.2, chloride 105, bicarb 22, glucose 173, BUN 29, creatinine 0.8, calcium 9.2, total protein 7.1, albumin 4.0, AST 18, ALT 13, alkaline phosphatase 135, total bilirubin 1.1, BNP 419.6, white count 11.2, hemoglobin 11.7, hematocrit 36.3, MCV 97.6, RDW 13.7 Respiratory viral panel is negative Chest x-ray reviewed by me shows increased pulmonary vascular markings.  Aortic atherosclerosis. Twelve-lead EKG reviewed by me shows sinus tachycardia with ST elevation in aVR and diffuse ST depression.   ED Course: Patient is a  80 year old female with a history of coronary artery disease status post CABG who presents to the ER for evaluation of chest pain that woke her up out of sleep associated with shortness of breath.  She had initial relief with sublingual nitroglycerin but the pain recurred and did not respond to nitroglycerin so she was brought into the ER by EMS as a code STEMI. She was taken emergently to the Cath Lab.    Review of Systems: As per HPI otherwise all other systems reviewed and negative.    Past Medical History:  Diagnosis Date   Diabetes mellitus without complication ([place])    Hyperlipidemia    Hypertension    Myocardial infarction [facility])     Past Surgical History:  Procedure Laterality Date   CARDIAC SURGERY     bypass   EYE SURGERY     cataract extraction     reports that she has quit smoking. Her smoking use included cigarettes. She has never used smokeless tobacco. She reports that she does not currently use alcohol. She reports that she does not currently use drugs.  Allergies  Allergen Reactions   Other Swelling   Tetanus Toxoids Swelling    Tetanus and diphtheria toxids   Penicillin G Rash    Family History  Problem Relation Age of Onset   Congestive Heart Failure Mother    Diabetes Father    Breast cancer Neg Hx       Prior to Admission medications   Medication Sig  Start Date End Date Taking? Authorizing Provider  amLODipine (NORVASC) 5 MG tablet Take 5 mg by mouth daily. 05/13/21   [provider]  aspirin 81 MG EC tablet Take by mouth.    [provider]  atorvastatin (LIPITOR) 80 MG tablet TAKE ONE TABLET BY MOUTH ONCE NIGHTLY 07/06/20   [provider]  baclofen (LIORESAL) 10 MG tablet Take 10 mg by mouth 3 (three) times daily. 10/01/20   [provider]  Cholecalciferol 25 MCG (1000 UT) capsule Take by mouth.    [provider]  insulin glargine (LANTUS) 100 UNIT/ML injection Inject 8 Units into the skin at bedtime.  Per sliding scale 05/11/20   [provider]  insulin lispro (HUMALOG) 100 UNIT/ML KwikPen USE PER SLIDING SCALE UP TO 10 UNITS 3 TIMES DAILY (  MAXIMUM DAILY DOSE: 30  UNITS) 07/21/20   [provider]  Insulin Pen Needle (B-D UF III MINI PEN NEEDLES) 31G X 5 MM MISC See admin instructions. 05/11/20   [provider]  lisinopril (ZESTRIL) 5 MG tablet Take 5 mg by mouth daily. 05/04/21   [provider]  metoprolol succinate (TOPROL-XL) 50 MG 24 hr tablet Take 50 mg by mouth daily. 05/13/21   [provider]  nitroGLYCERIN (NITROSTAT) 0.4 MG SL tablet DISSOLVE 1 TABLET UNDER THE TONGUE EVERY 5 MINUTES AS  NEEDED FOR CHEST PAIN. MAX  OF 3 TABLETS IN 15 MINUTES. CALL 911 IF PAIN PERSISTS. 09/01/20   [provider]  pramipexole (MIRAPEX) 0.25 MG tablet Take 1 tablet by mouth 3 (three) times daily. 07/20/20   [provider]  senna (SENOKOT) 8.6 MG tablet Take 2 tablets by mouth at bedtime.    [provider]  torsemide (DEMADEX) 20 MG tablet Take 20 mg by mouth daily. 04/14/21   [provider]    Physical Exam: Vitals:   06/01/21 0601 06/01/21 0643 06/01/21 0818 06/01/21 0830  BP:   (!) 138/54   Pulse:   84 88  Resp:   15 18  Temp:   (!) 97.5 F (36.4 C)   TempSrc:   Axillary   SpO2:  100% 100% 98%  Weight: 66.2 kg     Height: 4\' 11"  (1.499 m)        Vitals:   06/01/21 0601 06/01/21 0643 06/01/21 0818 06/01/21 0830  BP:   (!) 138/54   Pulse:   84 88  Resp:   15 18  Temp:   (!) 97.5 F (36.4 C)   TempSrc:   Axillary   SpO2:  100% 100% 98%  Weight: 66.2 kg     Height: 4\' 11"  (1.499 m)         Constitutional: Alert and oriented x 3 . Not in any apparent distress HEENT:      Head: Normocephalic and atraumatic.         Eyes: PERLA, EOMI, Conjunctivae are normal. Sclera is non-icteric.       Mouth/Throat: Mucous membranes are moist.       Neck: Supple with no signs of meningismus. Cardiovascular:  Regular rate and rhythm. No murmurs, gallops, or rubs. 2+ symmetrical distal pulses are present . No JVD. No LE edema Respiratory: Respiratory effort normal .Lungs sounds clear bilaterally. No wheezes, crackles, or rhonchi.  Gastrointestinal: Soft, non tender, and non distended with positive bowel sounds.  Genitourinary: No CVA tenderness. Musculoskeletal: Nontender with normal range of motion in all extremities. No cyanosis, or erythema of extremities. Neurologic:  Face  is symmetric. Moving all extremities. No gross focal neurologic deficits . Skin: Skin is warm, dry.  No rash or ulcers Psychiatric: Mood and affect are normal    Labs on Admission: I have personally reviewed following labs and imaging studies  CBC: Recent Labs  Lab 06/01/21 0551  WBC 11.2*  NEUTROABS 6.0  HGB 11.7*  HCT 36.3  MCV 97.6  PLT 99991111   Basic Metabolic Panel: Recent Labs  Lab 06/01/21 0551  NA 136  K 5.2*  CL 105  CO2 22  GLUCOSE 173*  BUN 29*  CREATININE 0.88  CALCIUM 9.2  MG 2.8*   GFR: Estimated Creatinine Clearance: 42.9 mL/min (by C-G formula based on SCr of 0.88 mg/dL). Liver Function Tests: Recent Labs  Lab 06/01/21 0551  AST 18  ALT 13  ALKPHOS 135*  BILITOT 1.1  PROT 7.1  ALBUMIN 4.0   No results for input(s): LIPASE, AMYLASE in the last 168 hours. No results for input(s): AMMONIA in the last 168 hours. Coagulation Profile: Recent Labs  Lab 06/01/21 0551  INR 1.0   Cardiac Enzymes: No results for input(s): CKTOTAL, CKMB, CKMBINDEX, TROPONINI in the last 168 hours. BNP (last 3 results) No results for input(s): PROBNP in the last 8760 hours. HbA1C: No results for input(s): HGBA1C in the last 72 hours. CBG: Recent Labs  Lab 06/01/21 0816  GLUCAP 180*   Lipid Profile: Recent Labs    06/01/21 0551  CHOL 127  HDL 47  LDLCALC 49  TRIG 154*  CHOLHDL 2.7   Thyroid Function Tests: No results for input(s): TSH, T4TOTAL, FREET4, T3FREE, THYROIDAB in the last 72  hours. Anemia Panel: No results for input(s): VITAMINB12, FOLATE, FERRITIN, TIBC, IRON, RETICCTPCT in the last 72 hours. Urine analysis: No results found for: COLORURINE, APPEARANCEUR, LABSPEC, PHURINE, GLUCOSEU, HGBUR, BILIRUBINUR, KETONESUR, PROTEINUR, UROBILINOGEN, NITRITE, LEUKOCYTESUR  Radiological Exams on Admission: DG Chest Port 1 View  Result Date: 06/01/2021 CLINICAL DATA:  79 year old female with history of shortness of breath. Severe chest pain. EXAM: PORTABLE CHEST 1 VIEW COMPARISON:  No priors. FINDINGS: Transcutaneous defibrillator pads projecting over the left hemithorax. There is cephalization of the pulmonary vasculature and slight indistinctness of the interstitial markings suggestive of mild pulmonary edema. No definite pleural effusions. No pneumothorax. Heart size appears borderline enlarged. Upper mediastinal contours are within normal limits. Atherosclerotic calcifications are noted in the thoracic aorta. Orthopedic fixation hardware projecting over the sternum. Upper mediastinal scratch the upper median sternotomy wire. Vascular marker from prior CABG. IMPRESSION: 1. The appearance of the chest may suggest developing congestive heart failure, as above. 2. Aortic atherosclerosis. Electronically Signed   By: Vinnie Langton M.D.   On: 06/01/2021 06:24     Assessment/Plan Principal Problem:   Non-STEMI (non-ST elevated myocardial infarction) ([place]) Active Problems:   Bilateral hearing loss   CAD (coronary artery disease)   PVD (peripheral vascular disease) ([facility])   GERD (gastroesophageal reflux disease)   Ischemic cardiomyopathy   Type 2 diabetes mellitus with stage 3 chronic kidney disease ([place])   Essential hypertension     Patient is a 80 year old female admitted to the hospital for non-ST elevation MI.   Acute non-ST elevation MI Patient with a history of coronary artery disease status post CABG who presents to the ER for evaluation of chest pain. Patient was  taken emergently to the Cath Lab and had a coronary angiogram done which showed occlusion of SVG to OM. Aggressive medical therapy has been recommended and intervention was deferred because of  complex left main disease with significant calcification in circumflex area was small to moderate in size. Continue heparin drip, aspirin, nitrates, beta-blockers, statin and ACE inhibitor.     Chronic diastolic dysfunction heart failure Continue Demadex, metoprolol and lisinopril    Type 2 diabetes mellitus with complications of stage III chronic kidney disease Maintain consistent carbohydrate diet Glycemic control with sliding scale insulin    DVT prophylaxis: Heparin  Code Status: DNR  Family Communication: Greater than 50% of time was spent discussing patient's condition and plan of care with her at the bedside.  All questions and concerns have been addressed.  She verbalizes understanding and agrees with the plan.  She lists her friend Erick Blinks as her healthcare power of attorney. Disposition Plan: Back to previous home environment Consults called: Cardiology  Status:At the time of admission, it appears that the appropriate admission status for this patient is inpatient. This is judged to be reasonable and necessary to provide the required intensity of service to ensure the patient's safety given the presenting symptoms, physical exam findings, and initial radiographic and laboratory data in the context of their comorbid conditions. Patient requires inpatient status due to high intensity of service, high risk for further deterioration and high frequency of surveillance required.     Lucile Shutters MD [facility]     06/01/2021, 9:25 AM

## 2021-06-01 NOTE — ED Triage Notes (Signed)
Pt presents to ER via ems from home as a code STEMI.  Pt states she woke up around 2am with severe chest pain.  Pt c/o associated sob with her chest pain.  Pt has hx of previous MI 2 years ago and hx CHF.  Pt A&O x4 at this time.

## 2021-06-01 NOTE — Progress Notes (Signed)
*  PRELIMINARY RESULTS* Echocardiogram 2D Echocardiogram has been performed.  Lindsey Pollard 06/01/2021, 11:15 AM

## 2021-06-01 NOTE — ED Notes (Signed)
Pt tp cath lab at this time.

## 2021-06-01 NOTE — ED Provider Notes (Signed)
[facility] Emergency Department Provider Note  ____________________________________________   Event Date/Time   First MD Initiated Contact with Patient 06/01/21 [phone]     (approximate)  I have reviewed the triage vital signs and the nursing notes.   HISTORY  Chief Complaint Chest Pain    HPI Lindsey Pollard is a 80 y.o. female hypertension, type 2 diabetes, coronary artery disease (RCA Promus DES 2012; RCA ISR with PCI, 12/2014, CABG x2, 05/2017) followed by [facility] Cardiology who presents to ED with chest pain that started at 2 AM while at rest.  Describes it as central, pressure-like pain without radiation.  States she took 2 nitroglycerin and a full dose aspirin at home without any relief.  She states this feels like her previous anginal equivalent when she has had a heart attack.  She states normally nitroglycerin will help with her chest pain but it did not today.  She called 911 and she states when the ambulance arrived she started to feel short of breath.  She was placed on a nonrebreather due to shortness of breath and increased work of breathing but was not hypoxic with EMS but has sats of 86 to 87% here on room air at rest.  She has increased work of breathing.  She denies any history of COPD, CHF, PE or DVT.  No lower extremity swelling or pain.  She states she has not on any diuretic.  States she previously lived in [place] but now lives here in [place].  EMS gave 1 spray of nitroglycerin.  She has not had any improvement of her pain.  Describes it as 10/10.        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   PVD (peripheral vascular disease) ([place]) 06/14/2017   Morbid obesity due to excess calories ([place]) 06/07/2015   Pseudophakia of right eye 11/10/2014   Bilateral hearing loss 10/05/2014   CAD (coronary artery disease) 10/05/2014   GERD (gastroesophageal reflux disease) 10/05/2014   RLS (restless legs syndrome) 09/09/2014   Mixed hyperlipidemia 08/27/2014   Type 2 diabetes mellitus with stage 3 chronic kidney disease ([place]) 08/27/2014    Past Surgical History:  Procedure Laterality Date   CARDIAC SURGERY     bypass   EYE SURGERY     cataract extraction    Prior to Admission medications   Medication Sig Start Date End Date Taking? Authorizing Provider  amLODipine (NORVASC) 5 MG tablet Take 5 mg by mouth daily. 05/13/21   [provider]  aspirin 81 MG EC tablet Take by mouth.    [provider]  atorvastatin (LIPITOR) 80 MG tablet TAKE ONE TABLET BY MOUTH ONCE NIGHTLY 07/06/20   [provider]  baclofen (LIORESAL) 10 MG tablet Take 10 mg by mouth 3 (three) times daily. 10/01/20   [provider]  Cholecalciferol 25 MCG (1000 UT) capsule Take by mouth.    [provider]  insulin glargine (LANTUS) 100 UNIT/ML injection Inject 8 Units into the skin at bedtime. Per sliding  scale 05/11/20   [provider]  insulin lispro (HUMALOG) 100 UNIT/ML KwikPen USE PER SLIDING SCALE UP TO 10 UNITS 3 TIMES DAILY (  MAXIMUM DAILY DOSE: 30  UNITS) 07/21/20   [provider]  Insulin Pen Needle (B-D UF III MINI PEN NEEDLES) 31G X 5 MM MISC See admin instructions. 05/11/20   [provider]  lisinopril (ZESTRIL) 5 MG tablet Take 5 mg by mouth daily. 05/04/21   [provider]  metoprolol succinate (TOPROL-XL) 50 MG 24 hr tablet Take 50 mg by mouth daily. 05/13/21   [provider]  nitroGLYCERIN  (NITROSTAT) 0.4 MG SL tablet DISSOLVE 1 TABLET UNDER THE TONGUE EVERY 5 MINUTES AS  NEEDED FOR CHEST PAIN. MAX  OF 3 TABLETS IN 15 MINUTES. CALL 911 IF PAIN PERSISTS. 09/01/20   [provider]  pramipexole (MIRAPEX) 0.25 MG tablet Take 1 tablet by mouth 3 (three) times daily. 07/20/20   [provider]  senna (SENOKOT) 8.6 MG tablet Take 2 tablets by mouth at bedtime.    [provider]  torsemide (DEMADEX) 20 MG tablet Take 20 mg by mouth daily. 04/14/21   [provider]    Allergies Other, Tetanus toxoids, and Penicillin g  Family History  Problem Relation Age of Onset   Congestive Heart Failure Mother    Diabetes Father    Breast cancer Neg Hx     Social History Social History   Tobacco Use   Smoking status: Former    Types: Cigarettes   Smokeless tobacco: Never  Substance Use Topics   Alcohol use: Not Currently   Drug use: Not Currently    Review of Systems Level 5 caveat secondary to acuity  ____________________________________________   PHYSICAL EXAM:  VITAL SIGNS: Today's Vitals   06/01/21 0558 06/01/21 0601 06/01/21 0642 06/01/21 0643  BP:      Pulse:      Resp:      Temp:      TempSrc:      SpO2:    100%  Weight:  66.2 kg    Height:  4\' 11"  (1.499 m)    PainSc: 10-Worst pain ever  3     Body mass index is 29.49 kg/m.  CONSTITUTIONAL: Alert and oriented, patient appears uncomfortable and has increased work of breathing, elderly, obese, extremely hard of hearing HEAD: Normocephalic EYES: Conjunctivae clear, pupils appear equal, EOM appear intact ENT: normal nose; moist mucous membranes NECK: Supple, normal ROM, no JVD appreciated CARD: Regular and tachycardic, S1 and S2 appreciated; no murmurs, no clicks, no rubs, no gallops RESP: Patient is tachypneic and hypoxic.  She has increased work of breathing and is speaking in truncated sentences.  Hypoxic to 86% on room air at rest.  She has bibasilar Rales and expiratory  wheezing.  No rhonchi. ABD/GI: Normal bowel sounds; non-distended; soft, non-tender, no rebound, no guarding, no peritoneal signs, no hepatosplenomegaly BACK: The back appears normal EXT: Normal ROM in all joints; no deformity noted, no edema; no cyanosis, no calf tenderness or calf swelling, extremities warm and well-perfused SKIN: Normal color for age and race; warm; no rash on exposed skin NEURO: Moves all extremities equally PSYCH: The patient's mood and manner are appropriate.  ____________________________________________   LABS (all labs ordered are listed, but only abnormal results are displayed)  Labs Reviewed  CBC WITH DIFFERENTIAL/PLATELET - Abnormal; Notable for the following components:      Result Value   WBC 11.2 (*)    RBC  3.72 (*)    Hemoglobin 11.7 (*)    Monocytes Absolute 1.2 (*)    All other components within normal limits  COMPREHENSIVE METABOLIC PANEL - Abnormal; Notable for the following components:   Potassium 5.2 (*)    Glucose, Bld 173 (*)    BUN 29 (*)    Alkaline Phosphatase 135 (*)    All other components within normal limits  LIPID PANEL - Abnormal; Notable for the following components:   Triglycerides 154 (*)    All other components within normal limits  MAGNESIUM - Abnormal; Notable for the following components:   Magnesium 2.8 (*)    All other components within normal limits  RESP PANEL BY RT-PCR (FLU A&B, COVID) ARPGX2  PROTIME-INR  APTT  HEMOGLOBIN A1C  BLOOD GAS, ARTERIAL  BRAIN NATRIURETIC PEPTIDE  TROPONIN I (HIGH SENSITIVITY)   ____________________________________________  EKG     EKG Interpretation  Date/Time:  Thursday June 01 2021 06:00:18 EST Ventricular Rate:  111 PR Interval:  216 QRS Duration: 98 QT Interval:  312 QTC Calculation: 424 R Axis:   -39 Text Interpretation: Sinus tachycardia with 1st degree A-V block Left axis deviation Septal infarct , age undetermined ST & T wave abnormality, consider anterolateral  ischemia Abnormal ECG Confirmed by Pryor Curia [phone]) on 06/01/2021 6:05:20 AM        ____________________________________________  RADIOLOGY Jessie Foot Makennah Omura, personally viewed and evaluated these images (plain radiographs) as part of my medical decision making, as well as reviewing the written report by the radiologist.  ED MD interpretation: Chest x-ray shows diffuse pulmonary edema.  Official radiology report(s): DG Chest Port 1 View  Result Date: 06/01/2021 CLINICAL DATA:  80 year old female with history of shortness of breath. Severe chest pain. EXAM: PORTABLE CHEST 1 VIEW COMPARISON:  No priors. FINDINGS: Transcutaneous defibrillator pads projecting over the left hemithorax. There is cephalization of the pulmonary vasculature and slight indistinctness of the interstitial markings suggestive of mild pulmonary edema. No definite pleural effusions. No pneumothorax. Heart size appears borderline enlarged. Upper mediastinal contours are within normal limits. Atherosclerotic calcifications are noted in the thoracic aorta. Orthopedic fixation hardware projecting over the sternum. Upper mediastinal scratch the upper median sternotomy wire. Vascular marker from prior CABG. IMPRESSION: 1. The appearance of the chest may suggest developing congestive heart failure, as above. 2. Aortic atherosclerosis. Electronically Signed   By: Vinnie Langton M.D.   On: 06/01/2021 06:24    ____________________________________________   PROCEDURES  Procedure(s) performed (including Critical Care):  Procedures  CRITICAL CARE Performed by: Cyril Mourning Allysson Rinehimer   Total critical care time: 60 minutes  Critical care time was exclusive of separately billable procedures and treating other patients.  Critical care was necessary to treat or prevent imminent or life-threatening deterioration.  Critical care was time spent personally by me on the following activities: development of treatment plan with patient  and/or surrogate as well as nursing, discussions with consultants, evaluation of patient's response to treatment, examination of patient, obtaining history from patient or surrogate, ordering and performing treatments and interventions, ordering and review of laboratory studies, ordering and review of radiographic studies, pulse oximetry and re-evaluation of patient's condition.  ____________________________________________   INITIAL IMPRESSION / ASSESSMENT AND PLAN / ED COURSE  As part of my medical decision making, I reviewed the following data within the [place] notes reviewed and incorporated, Labs reviewed , EKG interpreted , Old EKG reviewed, Old chart reviewed, Radiograph reviewed , A consult was requested and obtained from  this/these consultant(s) Cardiology, and Notes from prior ED visits         Patient here with chest pain that started at 2 AM that feels like her anginal equivalent.  Subsequently developed shortness of breath several hours later when EMS arrived.  Initial EKG with EMS shows ST elevation in aVR with diffuse ST depression in other leads.  Code STEMI activated in route.  Patient has already received full dose aspirin and 3 nitroglycerin without relief.  She does look like she is likely in a CHF exacerbation now.  Will obtain cardiac labs and placed on BiPAP for increased work of breathing.  Will obtain ABG.  Chest x-ray concerning for diffuse pulmonary edema.  Cardiology in route for evaluation.  ED PROGRESS  Patient continues to have chest pain despite 4 mg of morphine.  Her work of breathing is improving on BiPAP however she states she does not feel symptomatically she is getting any better.  Dr. Juliann Pares at bedside to take patient emergently to the Cath Lab.  She has received 4000 units of IV heparin.   I reviewed all nursing notes and pertinent previous records as available.  I have reviewed and interpreted any EKGs, lab and urine results,  imaging (as available).  ____________________________________________   FINAL CLINICAL IMPRESSION(S) / ED DIAGNOSES  Final diagnoses:  ST elevation myocardial infarction (STEMI), unspecified artery (HCC)  Acute respiratory failure with hypoxia (HCC)  Acute pulmonary edema [facility])     ED Discharge Orders     None       *Please note:  Lindsey Pollard was evaluated in Emergency Department on 06/01/2021 for the symptoms described in the history of present illness. She was evaluated in the context of the global COVID-19 pandemic, which necessitated consideration that the patient might be at risk for infection with the SARS-CoV-2 virus that causes COVID-19. Institutional protocols and algorithms that pertain to the evaluation of patients at risk for COVID-19 are in a state of rapid change based on information released by regulatory bodies including the CDC and federal and state organizations. These policies and algorithms were followed during the patient's care in the ED.  Some ED evaluations and interventions may be delayed as a result of limited staffing during and the pandemic.*   Note:  This document was prepared using Dragon voice recognition software and may include unintentional dictation errors.    Keath Matera, Layla Maw, DO 06/01/21 [phone]

## 2021-06-01 NOTE — Progress Notes (Signed)
Amber/red cloudy urine noted. MD notified and aware. No new orders at this time.

## 2021-06-01 NOTE — Consult Note (Signed)
CARDIOLOGY CONSULT NOTE               Patient ID: Lindsey Pollard MRN: [id number] DOB/AGE: 1941/05/12 80 y.o.  Admit date: 06/01/2021 Referring Physician Dr. Elesa Massed ER Primary Physician Dr. Einar Crow primary Primary Cardiologist  Reason for Consultation acute coronary syndrome non-STEMI congestive heart failure  HPI: Patient is a 80 year old female with known multivessel coronary disease status post coronary bypass in 2018 at [facility] with Dr. Kennon Rounds LIMA to the LAD SVG to OM patient had previous multiple stents to RCA.  Patient has history of diabetes hypertension hyperlipidemia reportedly been doing reasonably well with known peripheral vascular disease developed significant chest pain about 2 AM finally came to the emergency room via rescue and arrived at around 5:30 AM EKG had diffuse ST depression elevation in aVR but did not strictly meet criteria for STEMI the patient had persistent high level chest discomfort shortness of breath heart failure placed on BiPAP because of the diffuse nature presentation consistent with acute coronary syndrome and persistent anginal symptoms we decided to bring her to the cardiac Cath Lab for further evaluation.  Patient been treated with nitrates morphine heparin aspirin in the emergency room prior to coming to the Cath Lab.  Review of systems complete and found to be negative unless listed above     Past Medical History:  Diagnosis Date   Diabetes mellitus without complication (HCC)    Hyperlipidemia    Hypertension    Myocardial infarction [facility])     Past Surgical History:  Procedure Laterality Date   CARDIAC SURGERY     bypass   EYE SURGERY     cataract extraction    Medications Prior to Admission  Medication Sig Dispense Refill Last Dose   amLODipine (NORVASC) 5 MG tablet Take 5 mg by mouth daily.      aspirin 81 MG EC tablet Take by mouth.      atorvastatin (LIPITOR) 80 MG tablet TAKE ONE TABLET BY MOUTH ONCE NIGHTLY      baclofen  (LIORESAL) 10 MG tablet Take 10 mg by mouth 3 (three) times daily.      Cholecalciferol 25 MCG (1000 UT) capsule Take by mouth.      insulin glargine (LANTUS) 100 UNIT/ML injection Inject 8 Units into the skin at bedtime. Per sliding scale      insulin lispro (HUMALOG) 100 UNIT/ML KwikPen USE PER SLIDING SCALE UP TO 10 UNITS 3 TIMES DAILY (  MAXIMUM DAILY DOSE: 30  UNITS)      Insulin Pen Needle (B-D UF III MINI PEN NEEDLES) 31G X 5 MM MISC See admin instructions.      lisinopril (ZESTRIL) 5 MG tablet Take 5 mg by mouth daily.      metoprolol succinate (TOPROL-XL) 50 MG 24 hr tablet Take 50 mg by mouth daily.      nitroGLYCERIN (NITROSTAT) 0.4 MG SL tablet DISSOLVE 1 TABLET UNDER THE TONGUE EVERY 5 MINUTES AS  NEEDED FOR CHEST PAIN. MAX  OF 3 TABLETS IN 15 MINUTES. CALL 911 IF PAIN PERSISTS.      pramipexole (MIRAPEX) 0.25 MG tablet Take 1 tablet by mouth 3 (three) times daily.      senna (SENOKOT) 8.6 MG tablet Take 2 tablets by mouth at bedtime.      torsemide (DEMADEX) 20 MG tablet Take 20 mg by mouth daily.      Social History   Socioeconomic History   Marital status: Divorced    Spouse name: Not on  file   Number of children: Not on file   Years of education: Not on file   Highest education level: Not on file  Occupational History   Not on file  Tobacco Use   Smoking status: Former    Types: Cigarettes   Smokeless tobacco: Never  Substance and Sexual Activity   Alcohol use: Not Currently   Drug use: Not Currently   Sexual activity: Not on file  Other Topics Concern   Not on file  Social History Narrative   Not on file   Social Determinants of Health   Financial Resource Strain: Not on file  Food Insecurity: Not on file  Transportation Needs: Not on file  Physical Activity: Not on file  Stress: Not on file  Social Connections: Not on file  Intimate Partner Violence: Not on file    Family History  Problem Relation Age of Onset   Congestive Heart Failure Mother     Diabetes Father    Breast cancer Neg Hx       Review of systems complete and found to be negative unless listed above      PHYSICAL EXAM  General: Well developed, well nourished, in respiratory acute distress on BiPAP HEENT:  Normocephalic and atramatic Neck:  No JVD.  Lungs: Diffuse basilar rales bilaterally to auscultation and percussion. Heart: HRRR . Normal S1 and S2 without gallops or murmurs.  Abdomen: Bowel sounds are positive, abdomen soft and non-tender  Msk:  Back normal, normal gait. Normal strength and tone for age. Extremities: No clubbing, cyanosis or edema.   Neuro: Alert and oriented X 3. Psych:  Good affect, responds appropriately  Labs:   Lab Results  Component Value Date   WBC 11.2 (H) 06/01/2021   HGB 11.7 (L) 06/01/2021   HCT 36.3 06/01/2021   MCV 97.6 06/01/2021   PLT 262 06/01/2021    Recent Labs  Lab 06/01/21 0551  NA 136  K 5.2*  CL 105  CO2 22  BUN 29*  CREATININE 0.88  CALCIUM 9.2  PROT 7.1  BILITOT 1.1  ALKPHOS 135*  ALT 13  AST 18  GLUCOSE 173*   No results found for: CKTOTAL, CKMB, CKMBINDEX, TROPONINI  Lab Results  Component Value Date   CHOL 127 06/01/2021   Lab Results  Component Value Date   HDL 47 06/01/2021   Lab Results  Component Value Date   LDLCALC 49 06/01/2021   Lab Results  Component Value Date   TRIG 154 (H) 06/01/2021   Lab Results  Component Value Date   CHOLHDL 2.7 06/01/2021   No results found for: LDLDIRECT    Radiology: [facility] Chest Port 1 View  Result Date: 06/01/2021 CLINICAL DATA:  80 year old female with history of shortness of breath. Severe chest pain. EXAM: PORTABLE CHEST 1 VIEW COMPARISON:  No priors. FINDINGS: Transcutaneous defibrillator pads projecting over the left hemithorax. There is cephalization of the pulmonary vasculature and slight indistinctness of the interstitial markings suggestive of mild pulmonary edema. No definite pleural effusions. No pneumothorax. Heart size appears  borderline enlarged. Upper mediastinal contours are within normal limits. Atherosclerotic calcifications are noted in the thoracic aorta. Orthopedic fixation hardware projecting over the sternum. Upper mediastinal scratch the upper median sternotomy wire. Vascular marker from prior CABG. IMPRESSION: 1. The appearance of the chest may suggest developing congestive heart failure, as above. 2. Aortic atherosclerosis. Electronically Signed   By: Trudie Reed M.D.   On: 06/01/2021 06:24    EKG: Normal sinus rhythm  diffuse ST depression with ST elevation in aVR rate of 80  ASSESSMENT AND PLAN:  NSTEMI ACS CABG x 2 DM HTN CHF CAD Obesity Hyperlipidemia . Plan Agreed admit and take directly to cardiac Cath Lab for evaluation of coronary anatomy Recommend intention to treat for acute coronary syndrome Patient did not meet STEMI criteria but appeared to be non-STEMI Cardiac cath showed occlusion of SVG to OM would recommend aggressive medical therapy Intervention was deferred because of complex left main disease with significant calcification and circumflex area was small to moderate in size.  Patient had patient had patent LIMA and RCA also was widely patent with stent Recommend continued heparin aspirin beta-blockers ACE inhibitor statin Continue diabetes management and care Echocardiogram for evaluation of left ventricular function and valvular structures Transfer the patient to ICU for aggressive medical therapy Case reviewed with other interventionalists Dr. Darrold Junker and Dr End who concur with the plan Continue heart failure therapy with IV diuretic therapy Case discussed with ICU attending as well as hospitalist Cardiology management will be supervised by Dr. Darrold Junker who is on-call for cardiology   Signed: Alwyn Pea MD 06/01/2021, 7:56 AM

## 2021-06-02 ENCOUNTER — Encounter: Payer: Self-pay | Admitting: Internal Medicine

## 2021-06-02 DIAGNOSIS — E1169 Type 2 diabetes mellitus with other specified complication: Secondary | ICD-10-CM

## 2021-06-02 DIAGNOSIS — B369 Superficial mycosis, unspecified: Secondary | ICD-10-CM

## 2021-06-02 DIAGNOSIS — I5032 Chronic diastolic (congestive) heart failure: Secondary | ICD-10-CM

## 2021-06-02 DIAGNOSIS — B356 Tinea cruris: Secondary | ICD-10-CM

## 2021-06-02 DIAGNOSIS — E785 Hyperlipidemia, unspecified: Secondary | ICD-10-CM

## 2021-06-02 LAB — GLUCOSE, CAPILLARY
Glucose-Capillary: 102 mg/dL — ABNORMAL HIGH (ref 70–99)
Glucose-Capillary: 163 mg/dL — ABNORMAL HIGH (ref 70–99)
Glucose-Capillary: 165 mg/dL — ABNORMAL HIGH (ref 70–99)
Glucose-Capillary: 114 mg/dL — ABNORMAL HIGH (ref 70–99)

## 2021-06-02 LAB — BASIC METABOLIC PANEL
Anion gap: 6 (ref 5–15)
BUN: 24 mg/dL — ABNORMAL HIGH (ref 8–23)
CO2: 27 mmol/L (ref 22–32)
Calcium: 8.5 mg/dL — ABNORMAL LOW (ref 8.9–10.3)
Chloride: 100 mmol/L (ref 98–111)
Creatinine, Ser: 1.04 mg/dL — ABNORMAL HIGH (ref 0.44–1.00)
GFR, Estimated: 55 mL/min — ABNORMAL LOW (ref >60)
Glucose, Bld: 105 mg/dL — ABNORMAL HIGH (ref 70–99)
Potassium: 4.4 mmol/L (ref 3.5–5.1)
Sodium: 133 mmol/L — ABNORMAL LOW (ref 135–145)

## 2021-06-02 LAB — HEPARIN LEVEL (UNFRACTIONATED)
Heparin Unfractionated: 0.47 IU/mL (ref 0.30–0.70)
Heparin Unfractionated: 0.27 IU/mL — ABNORMAL LOW (ref 0.30–0.70)

## 2021-06-02 LAB — HEMOGLOBIN A1C
Hgb A1c MFr Bld: 6.7 % — ABNORMAL HIGH (ref 4.8–5.6)
Mean Plasma Glucose: 146 mg/dL

## 2021-06-02 LAB — CBC
HCT: 30.5 % — ABNORMAL LOW (ref 36.0–46.0)
Hemoglobin: 10 g/dL — ABNORMAL LOW (ref 12.0–15.0)
MCH: 31.1 pg (ref 26.0–34.0)
MCHC: 32.8 g/dL (ref 30.0–36.0)
MCV: 94.7 fL (ref 80.0–100.0)
Platelets: 199 10*3/uL (ref 150–400)
RBC: 3.22 MIL/uL — ABNORMAL LOW (ref 3.87–5.11)
RDW: 13.6 % (ref 11.5–15.5)
WBC: 6.7 10*3/uL (ref 4.0–10.5)
nRBC: 0 % (ref 0.0–0.2)

## 2021-06-02 MED ORDER — TERBINAFINE HCL 1 % EX CREA
TOPICAL_CREAM | Freq: Two times a day (BID) | CUTANEOUS | Status: DC
  Filled 2021-06-02: qty 12

## 2021-06-02 MED ORDER — CLOPIDOGREL BISULFATE 75 MG PO TABS
75.0000 mg | ORAL_TABLET | Freq: Every day | ORAL | Status: DC
  Administered 2021-06-02 – 2021-06-03 (×2): 75 mg via ORAL
  Filled 2021-06-02 (×2): qty 1

## 2021-06-02 MED ORDER — NYSTATIN 100000 UNIT/GM EX POWD
Freq: Three times a day (TID) | CUTANEOUS | Status: DC
  Filled 2021-06-02: qty 15

## 2021-06-02 MED ORDER — PNEUMOCOCCAL VAC POLYVALENT 25 MCG/0.5ML IJ INJ
0.5000 mL | INJECTION | INTRAMUSCULAR | Status: AC
  Administered 2021-06-03: 0.5 mL via INTRAMUSCULAR
  Filled 2021-06-02: qty 0.5

## 2021-06-02 MED ORDER — ISOSORBIDE MONONITRATE ER 30 MG PO TB24
60.0000 mg | ORAL_TABLET | Freq: Every day | ORAL | Status: DC
  Administered 2021-06-02 – 2021-06-03 (×2): 60 mg via ORAL
  Filled 2021-06-02 (×2): qty 2

## 2021-06-02 MED ORDER — HEPARIN BOLUS VIA INFUSION
850.0000 [IU] | Freq: Once | INTRAVENOUS | Status: AC
  Administered 2021-06-02: 850 [IU] via INTRAVENOUS
  Filled 2021-06-02: qty 850

## 2021-06-02 NOTE — Progress Notes (Signed)
[facility]  SUBJECTIVE: Patient laying flat in bed, reports doing well, denies chest pain or shortness of breath   Vitals:   06/02/21 0400 06/02/21 0500 06/02/21 0600 06/02/21 0700  BP: (!) 143/42 (!) 160/50 (!) 171/47 (!) 161/39  Pulse: (!) 56 65 70 65  Resp: 14 11 12 13   Temp:  98.8 F (37.1 C)    TempSrc:  Oral    SpO2: 100% 100% 99% 100%  Weight:      Height:         Intake/Output Summary (Last 24 hours) at 06/02/2021 [id number] Last data filed at 06/02/2021 0740 Gross per 24 hour  Intake 1045.71 ml  Output 3200 ml  Net -2154.29 ml      PHYSICAL EXAM  General: Well developed, well nourished, in no acute distress HEENT:  Normocephalic and atramatic Neck:  No JVD.  Lungs: Clear bilaterally to auscultation and percussion. Heart: HRRR . Normal S1 and S2 without gallops or murmurs.  Abdomen: Bowel sounds are positive, abdomen soft and non-tender  Msk:  Back normal, normal gait. Normal strength and tone for age. Extremities: No clubbing, cyanosis or edema.   Neuro: Alert and oriented X 3. Psych:  Good affect, responds appropriately   LABS: Basic Metabolic Panel: Recent Labs    06/01/21 0551 06/02/21 0547  NA 136 133*  K 5.2* 4.4  CL 105 100  CO2 22 27  GLUCOSE 173* 105*  BUN 29* 24*  CREATININE 0.88 1.04*  CALCIUM 9.2 8.5*  MG 2.8*  --    Liver Function Tests: Recent Labs    06/01/21 0551  AST 18  ALT 13  ALKPHOS 135*  BILITOT 1.1  PROT 7.1  ALBUMIN 4.0   No results for input(s): LIPASE, AMYLASE in the last 72 hours. CBC: Recent Labs    06/01/21 0551 06/02/21 0547  WBC 11.2* 6.7  NEUTROABS 6.0  --   HGB 11.7* 10.0*  HCT 36.3 30.5*  MCV 97.6 94.7  PLT 262 199   Cardiac Enzymes: No results for input(s): CKTOTAL, CKMB, CKMBINDEX, TROPONINI in the last 72 hours. BNP: Invalid input(s): POCBNP D-Dimer: No results for input(s): DDIMER in the last 72 hours. Hemoglobin A1C: Recent Labs    06/01/21 0930  HGBA1C 6.7*   Fasting Lipid  Panel: Recent Labs    06/01/21 0551  CHOL 127  HDL 47  LDLCALC 49  TRIG 154*  CHOLHDL 2.7   Thyroid Function Tests: No results for input(s): TSH, T4TOTAL, T3FREE, THYROIDAB in the last 72 hours.  Invalid input(s): FREET3 Anemia Panel: No results for input(s): VITAMINB12, FOLATE, FERRITIN, TIBC, IRON, RETICCTPCT in the last 72 hours.  CARDIAC CATHETERIZATION  Result Date: 06/01/2021   Mid RCA lesion is 50% stenosed.   Ost RCA lesion is 50% stenosed.   Ost LM to Ost LAD lesion is 95% stenosed.   Prox LAD to Mid LAD lesion is 100% stenosed.   Mid LAD lesion is 100% stenosed.   Origin lesion is 100% stenosed.   The left ventricular systolic function is normal.   LV end diastolic pressure is normal.   The left ventricular ejection fraction is 55-65% by visual estimate. Conclusion Non-STEMI presentation with unstable features diffuse ST depression persistent anginal chest pain.  EKG did not meet STEMI criteria but clinically the patient presented with what appeared to be an acute coronary syndrome and cardiac cath was recommended emergently. Left ventricular function appeared to be relatively normal greater than 55% with mild inferior hypo- Coronaries showed diffuse  left main disease 95% Mid LAD 100% CTO grafted Circumflex moderate size with minor irregularities RCA large very large long ostial to distal stent widely patent except for one focal mid area of 50% in-stent restenosis TIMI-3 flow. Patient found to have severe peripheral vascular disease and right femoral artery and tortuous aorta Catheters and sheath were removed Intervention was deferred Patient was treated aggressively medically in ICU   DG Chest Port 1 View  Result Date: 06/01/2021 CLINICAL DATA:  80 year old female with history of shortness of breath. Severe chest pain. EXAM: PORTABLE CHEST 1 VIEW COMPARISON:  No priors. FINDINGS: Transcutaneous defibrillator pads projecting over the left hemithorax. There is cephalization of the  pulmonary vasculature and slight indistinctness of the interstitial markings suggestive of mild pulmonary edema. No definite pleural effusions. No pneumothorax. Heart size appears borderline enlarged. Upper mediastinal contours are within normal limits. Atherosclerotic calcifications are noted in the thoracic aorta. Orthopedic fixation hardware projecting over the sternum. Upper mediastinal scratch the upper median sternotomy wire. Vascular marker from prior CABG. IMPRESSION: 1. The appearance of the chest may suggest developing congestive heart failure, as above. 2. Aortic atherosclerosis. Electronically Signed   By: Vinnie Langton M.D.   On: 06/01/2021 06:24   ECHOCARDIOGRAM COMPLETE  Result Date: 06/01/2021    ECHOCARDIOGRAM REPORT   Patient Name:   Lindsey Pollard [facility] Date of Exam: 06/01/2021 Medical Rec #:  [id number]     Height:       59.0 in Accession #:    [id number]    Weight:       146.0 lb Date of Birth:  18-Apr-1941     BSA:          1.613 m Patient Age:    80 years      BP:           138/54 mmHg Patient Gender: F             HR:           88 bpm. Exam Location:  [facility] Procedure: 2D Echo, Cardiac Doppler and Color Doppler Indications:     I21.9 Acute myocardial infarction I21.9  History:         Patient has no prior history of Echocardiogram examinations.                  Previous Myocardial Infarction; Risk Factors:Hypertension,                  Diabetes and Dyslipidemia.  Sonographer:     Sherrie Sport Referring Phys:  [id number] DWAYNE D CALLWOOD Diagnosing Phys: Isaias Cowman MD  Sonographer Comments: Suboptimal apical window. IMPRESSIONS  1. Left ventricular ejection fraction, by estimation, is 45 to 50%. The left ventricle has mildly decreased function. The left ventricle has no regional wall motion abnormalities. The left ventricular internal cavity size was mildly dilated. Left ventricular diastolic parameters are consistent with Grade I diastolic dysfunction (impaired relaxation).  2. Right  ventricular systolic function is normal. The right ventricular size is normal.  3. The mitral valve is normal in structure. Mild mitral valve regurgitation. No evidence of mitral stenosis.  4. The aortic valve is normal in structure. Aortic valve regurgitation is mild to moderate. Mild aortic valve stenosis.  5. The inferior vena cava is normal in size with greater than 50% respiratory variability, suggesting right atrial pressure of 3 mmHg. FINDINGS  Left Ventricle: Left ventricular ejection fraction, by estimation, is 45 to 50%. The left ventricle has mildly decreased  function. The left ventricle has no regional wall motion abnormalities. The left ventricular internal cavity size was mildly dilated. There is no left ventricular hypertrophy. Left ventricular diastolic parameters are consistent with Grade I diastolic dysfunction (impaired relaxation). Right Ventricle: The right ventricular size is normal. No increase in right ventricular wall thickness. Right ventricular systolic function is normal. Left Atrium: Left atrial size was normal in size. Right Atrium: Right atrial size was normal in size. Pericardium: There is no evidence of pericardial effusion. Mitral Valve: The mitral valve is normal in structure. Mild mitral valve regurgitation. No evidence of mitral valve stenosis. MV peak gradient, 4.4 mmHg. The mean mitral valve gradient is 2.0 mmHg. Tricuspid Valve: The tricuspid valve is normal in structure. Tricuspid valve regurgitation is not demonstrated. No evidence of tricuspid stenosis. Aortic Valve: The aortic valve is normal in structure. Aortic valve regurgitation is mild to moderate. Aortic regurgitation PHT measures 394 msec. Mild aortic stenosis is present. Aortic valve mean gradient measures 9.8 mmHg. Aortic valve peak gradient measures 16.8 mmHg. Aortic valve area, by VTI measures 1.21 cm. Pulmonic Valve: The pulmonic valve was normal in structure. Pulmonic valve regurgitation is not visualized. No  evidence of pulmonic stenosis. Aorta: The aortic root is normal in size and structure. Venous: The inferior vena cava is normal in size with greater than 50% respiratory variability, suggesting right atrial pressure of 3 mmHg. IAS/Shunts: No atrial level shunt detected by color flow Doppler.  LEFT VENTRICLE PLAX 2D LVIDd:         4.70 cm   Diastology LVIDs:         2.90 cm   LV e' medial:    4.90 cm/s LV PW:         1.20 cm   LV E/e' medial:  14.6 LV IVS:        0.90 cm   LV e' lateral:   5.98 cm/s LVOT diam:     2.00 cm   LV E/e' lateral: 12.0 LV SV:         60 LV SV Index:   37 LVOT Area:     3.14 cm  LEFT ATRIUM             Index        RIGHT ATRIUM           Index LA diam:        3.60 cm 2.23 cm/m   RA Area:     11.70 cm LA Vol (A2C):   70.7 ml 43.82 ml/m  RA Volume:   25.50 ml  15.80 ml/m LA Vol (A4C):   52.6 ml 32.60 ml/m LA Biplane Vol: 61.7 ml 38.24 ml/m  AORTIC VALVE                     PULMONIC VALVE AV Area (Vmax):    1.29 cm      PV Vmax:        0.88 m/s AV Area (Vmean):   1.05 cm      PV Vmean:       63.900 cm/s AV Area (VTI):     1.21 cm      PV VTI:         0.172 m AV Vmax:           204.75 cm/s   PV Peak grad:   3.1 mmHg AV Vmean:          145.250 cm/s  PV Mean grad:  2.0 mmHg AV VTI:            0.492 m       RVOT Peak grad: 4 mmHg AV Peak Grad:      16.8 mmHg AV Mean Grad:      9.8 mmHg LVOT Vmax:         84.20 cm/s LVOT Vmean:        48.700 cm/s LVOT VTI:          0.190 m LVOT/AV VTI ratio: 0.39 AI PHT:            394 msec  AORTA Ao Root diam: 2.70 cm MITRAL VALVE                TRICUSPID VALVE MV Area (PHT): 2.42 cm     TR Peak grad:   29.2 mmHg MV Area VTI:   2.23 cm     TR Vmax:        270.00 cm/s MV Peak grad:  4.4 mmHg MV Mean grad:  2.0 mmHg     SHUNTS MV Vmax:       1.05 m/s     Systemic VTI:  0.19 m MV Vmean:      59.8 cm/s    Systemic Diam: 2.00 cm MV Decel Time: 314 msec     Pulmonic VTI:  0.207 m MV E velocity: 71.60 cm/s MV A velocity: 102.00 cm/s MV E/A ratio:  0.70  Isaias Cowman MD Electronically signed by Isaias Cowman MD Signature Date/Time: 06/01/2021/3:54:17 PM    Final      Echo LVEF 45 to 50%  TELEMETRY: Sinus rhythm:  ASSESSMENT AND PLAN:  Principal Problem:   Non-STEMI (non-ST elevated myocardial infarction) (HCC) Active Problems:   Bilateral hearing loss   CAD (coronary artery disease)   PVD (peripheral vascular disease) (HCC)   GERD (gastroesophageal reflux disease)   Ischemic cardiomyopathy   Type 2 diabetes mellitus with stage 3 chronic kidney disease (HCC)   Essential hypertension    1.  Non-ST elevation myocardial infarction, emergent cardiac catheterization 06/01/2021 revealing 95% calcified gnosis left main, occluded mid LAD, patent stent RCA, patent LIMA to LAD, occluded SVG to OM1, best felt to be treated medically, observed overnight on heparin drip, without recurrent chest pain, high-sensitivity troponin 15 and 2670 2.  Chronic diastolic congestive heart failure, on torsemide 3.  Essential hypertension, systolic blood pressure mildly elevated 4.  Type 2 diabetes 5.  Chronic kidney disease, stage III  Recommendations  1.  Agree with current therapy 2.  Continue heparin drip, likely DC in a.m. 3.  Continue metoprolol succinate 4.  Continue torsemide 5.  Add isosorbide mononitrate 60 mg daily 6.  Add clopidogrel 75 mg daily 7.  May transfer to PCU   Isaias Cowman, MD, PhD, [facility] 06/02/2021 8:28 AM

## 2021-06-02 NOTE — Consult Note (Signed)
ANTICOAGULATION CONSULT NOTE  Pharmacy Consult for IV Heparin Indication: chest pain/ACS  Patient Measurements: Height: 4\' 11"  (149.9 cm) Weight: 66.2 kg (145 lb 15.1 oz) IBW/kg (Calculated) : 43.2 Heparin Dosing Weight: 57.7 kg  Labs: Recent Labs    06/01/21 0551 06/01/21 0930 06/01/21 2037 06/02/21 0547  HGB 11.7*  --   --  10.0*  HCT 36.3  --   --  30.5*  PLT 262  --   --  199  APTT 32  --   --   --   LABPROT 12.8  --   --   --   INR 1.0  --   --   --   HEPARINUNFRC  --   --  0.27* 0.47  CREATININE 0.88  --   --   --   TROPONINIHS 15 2,670*  --   --      Estimated Creatinine Clearance: 42.9 mL/min (by C-G formula based on SCr of 0.88 mg/dL).   Medications:  No anticoagulation prior to admission per my chart review  Assessment: Patient is a 80 y/o F with medical history including HTN, diabetes, CAD who presented to the ED 12/8 early AM with acute chest pain refractory to NTG that started around 2 AM. Patient taken emergently for cardiac catheterization. Pharmacy consulted to re-initiate heparin post cardiac catheterization.  Baseline CBC acceptable. Baseline aPTT and PT-INR within normal limits.  Goal of Therapy:  Heparin level 0.3-0.7 units/ml Monitor platelets by anticoagulation protocol: Yes   Plan:  --Heparin level is therapeutic x 1 --Maintain heparin infusion at 850 units/hr --Recheck confirmatory HL in 8 hours --Daily CBC per protocol while on IV heparin  14/8 06/02/2021,8:03 AM

## 2021-06-02 NOTE — Progress Notes (Signed)
Patient ID: Lindsey Pollard, female   DOB: [birth year], 80 y.o.   MRN: [id number] [facility] PROGRESS NOTE  Lindsey Pollard [id number] DOB: [birth year] DOA: 06/01/2021 PCP: Lauro Regulus, MD  HPI/Subjective: Patient seen this morning and offers no complaints.  Admitted with NSTEMI.  No complaints of chest pain or shortness of breath.  Objective: Vitals:   06/02/21 1300 06/02/21 1400  BP: (!) 116/36 (!) 99/40  Pulse: 91 81  Resp: 17 15  Temp:    SpO2: 94% 95%    Intake/Output Summary (Last 24 hours) at 06/02/2021 1522 Last data filed at 06/02/2021 1400 Gross per 24 hour  Intake 679.57 ml  Output 950 ml  Net -270.43 ml   Filed Weights   06/01/21 0601 06/01/21 0830  Weight: 66.2 kg 66.2 kg    ROS: Review of Systems  Respiratory:  Negative for shortness of breath.   Cardiovascular:  Negative for chest pain.  Gastrointestinal:  Negative for abdominal pain, nausea and vomiting.  Exam: Physical Exam HENT:     Head: Normocephalic.     Mouth/Throat:     Pharynx: No oropharyngeal exudate.  Eyes:     General: Lids are normal.     Conjunctiva/sclera: Conjunctivae normal.  Cardiovascular:     Rate and Rhythm: Normal rate and regular rhythm.     Heart sounds: Normal heart sounds, S1 normal and S2 normal.  Pulmonary:     Breath sounds: No decreased breath sounds, wheezing, rhonchi or rales.  Abdominal:     Palpations: Abdomen is soft.     Tenderness: There is no abdominal tenderness.  Musculoskeletal:     Right lower leg: No swelling.     Left lower leg: No swelling.  Skin:    General: Skin is warm.     Comments: Fungal rash bilateral groin right worse than left.  Fungal rash bilateral heels left worse than right.  Neurological:     Mental Status: She is alert and oriented to person, place, and time.      Scheduled Meds:  aspirin EC  81 mg Oral Daily   atorvastatin  80 mg Oral Daily   Chlorhexidine Gluconate Cloth  6 each Topical Daily   clopidogrel  75 mg  Oral Daily   insulin aspart  0-15 Units Subcutaneous TID WC   insulin aspart  0-5 Units Subcutaneous QHS   isosorbide mononitrate  60 mg Oral Daily   metoprolol succinate  50 mg Oral Daily   nystatin   Topical TID   [START ON 06/03/2021] pneumococcal 23 valent vaccine  0.5 mL Intramuscular Tomorrow-1000   sodium chloride flush  3 mL Intravenous Q12H   terbinafine   Topical BID   torsemide  20 mg Oral Daily   Continuous Infusions:  sodium chloride     heparin 850 Units/hr (06/02/21 0700)    Assessment/Plan:  NSTEMI.  Patient was brought to the cardiac Cath Lab.  Mid RCA 50% stenosis, ostial RCA 50% stenosis, ostial LM to ostial LAD 95% stenosis, proximal LAD to mid LAD 100% stenosis, mid LAD 100% stenosis.  EF normal.  Cardiology recommended medical management with heparin drip for 48 hours.  On aspirin, Plavix, atorvastatin and metoprolol.  LDL 49.  Watch creatinine again tomorrow morning. Chronic diastolic heart failure on Demadex, metoprolol and lisinopril Type 2 diabetes mellitus with hyperlipidemia.  Hemoglobin A1c 6.7.  Patient on sliding scale insulin. Fungal infection in groin.  Nystatin powder Fungal infection heels terbinafine cream. Acute hypoxic respiratory failure.  Pulse ox 87% on room air.  Asked nursing staff to take her off the oxygen        Code Status:     Code Status Orders  (From admission, onward)           Start     Ordered   06/01/21 0918  Do not attempt resuscitation (DNR)  Continuous       Question Answer Comment  In the event of cardiac or respiratory ARREST Do not call a "code blue"   In the event of cardiac or respiratory ARREST Do not perform Intubation, CPR, defibrillation or ACLS   In the event of cardiac or respiratory ARREST Use medication by any route, position, wound care, and other measures to relive pain and suffering. May use oxygen, suction and manual treatment of airway obstruction as needed for comfort.   Comments CODE STATUS was  discussed with patient and she is a DO NOT RESUSCITATE.      06/01/21 9528           Code Status History     Date Active Date Inactive Code Status Order ID Comments User Context   06/01/2021 0831 06/01/2021 0917 Full Code [id number]  Lindsey Pea, MD Inpatient      Family Communication: Left message for nephew Disposition Plan: Status is: Inpatient  Consultants: Cardiology  Procedures: Cardiac cath  Time spent: 28 minutes  Lindsey Pollard [employer]

## 2021-06-02 NOTE — Consult Note (Signed)
ANTICOAGULATION CONSULT NOTE  Pharmacy Consult for IV Heparin Indication: chest pain/ACS  Patient Measurements: Height: 4\' 11"  (149.9 cm) Weight: 66.2 kg (145 lb 15.1 oz) IBW/kg (Calculated) : 43.2 Heparin Dosing Weight: 57.7 kg  Labs: Recent Labs    06/01/21 0551 06/01/21 0930 06/01/21 2037 06/02/21 0547 06/02/21 1407  HGB 11.7*  --   --  10.0*  --   HCT 36.3  --   --  30.5*  --   PLT 262  --   --  199  --   APTT 32  --   --   --   --   LABPROT 12.8  --   --   --   --   INR 1.0  --   --   --   --   HEPARINUNFRC  --   --  0.27* 0.47 0.27*  CREATININE 0.88  --   --  1.04*  --   TROPONINIHS 15 2,670*  --   --   --      Estimated Creatinine Clearance: 36.3 mL/min (A) (by C-G formula based on SCr of 1.04 mg/dL (H)).   Medications:  No anticoagulation prior to admission per my chart review  Assessment: Patient is a 80 y/o F with medical history including HTN, diabetes, CAD who presented to the ED 12/8 early AM with acute chest pain refractory to NTG that started around 2 AM. Patient taken emergently for cardiac catheterization. Pharmacy consulted to re-initiate heparin post cardiac catheterization.  Baseline CBC acceptable. Baseline aPTT and PT-INR within normal limits.  Goal of Therapy:  Heparin level 0.3-0.7 units/ml Monitor platelets by anticoagulation protocol: Yes   Plan:  --06/02/21 @ 1407 HL 0.27 -- Heparin bolus 850 units x 1  --Increase heparin infusion to 950 units/hr --Recheck HL in 8 hours following rate change --Daily CBC per protocol while on IV heparin  14/9/22, PharmD, BCPS Clinical Pharmacist   06/02/2021,3:36 PM

## 2021-06-03 DIAGNOSIS — D649 Anemia, unspecified: Secondary | ICD-10-CM

## 2021-06-03 DIAGNOSIS — J9621 Acute and chronic respiratory failure with hypoxia: Secondary | ICD-10-CM

## 2021-06-03 DIAGNOSIS — J9601 Acute respiratory failure with hypoxia: Secondary | ICD-10-CM

## 2021-06-03 DIAGNOSIS — N179 Acute kidney failure, unspecified: Secondary | ICD-10-CM

## 2021-06-03 LAB — BASIC METABOLIC PANEL
Anion gap: 10 (ref 5–15)
Anion gap: 8 (ref 5–15)
BUN: 33 mg/dL — ABNORMAL HIGH (ref 8–23)
BUN: 33 mg/dL — ABNORMAL HIGH (ref 8–23)
CO2: 25 mmol/L (ref 22–32)
CO2: 24 mmol/L (ref 22–32)
Calcium: 8.6 mg/dL — ABNORMAL LOW (ref 8.9–10.3)
Calcium: 8.3 mg/dL — ABNORMAL LOW (ref 8.9–10.3)
Chloride: 101 mmol/L (ref 98–111)
Chloride: 98 mmol/L (ref 98–111)
Creatinine, Ser: 1.57 mg/dL — ABNORMAL HIGH (ref 0.44–1.00)
Creatinine, Ser: 1.37 mg/dL — ABNORMAL HIGH (ref 0.44–1.00)
GFR, Estimated: 33 mL/min — ABNORMAL LOW (ref >60)
GFR, Estimated: 39 mL/min — ABNORMAL LOW (ref >60)
Glucose, Bld: 99 mg/dL (ref 70–99)
Glucose, Bld: 112 mg/dL — ABNORMAL HIGH (ref 70–99)
Potassium: 4.1 mmol/L (ref 3.5–5.1)
Potassium: 4.3 mmol/L (ref 3.5–5.1)
Sodium: 136 mmol/L (ref 135–145)
Sodium: 130 mmol/L — ABNORMAL LOW (ref 135–145)

## 2021-06-03 LAB — CBC
HCT: 27.1 % — ABNORMAL LOW (ref 36.0–46.0)
Hemoglobin: 9 g/dL — ABNORMAL LOW (ref 12.0–15.0)
MCH: 31.3 pg (ref 26.0–34.0)
MCHC: 33.2 g/dL (ref 30.0–36.0)
MCV: 94.1 fL (ref 80.0–100.0)
Platelets: 188 10*3/uL (ref 150–400)
RBC: 2.88 MIL/uL — ABNORMAL LOW (ref 3.87–5.11)
RDW: 13.4 % (ref 11.5–15.5)
WBC: 6.6 10*3/uL (ref 4.0–10.5)
nRBC: 0 % (ref 0.0–0.2)

## 2021-06-03 LAB — HEPARIN LEVEL (UNFRACTIONATED)
Heparin Unfractionated: 0.49 IU/mL (ref 0.30–0.70)
Heparin Unfractionated: 0.49 IU/mL (ref 0.30–0.70)

## 2021-06-03 LAB — GLUCOSE, CAPILLARY
Glucose-Capillary: 91 mg/dL (ref 70–99)
Glucose-Capillary: 103 mg/dL — ABNORMAL HIGH (ref 70–99)

## 2021-06-03 LAB — TROPONIN I (HIGH SENSITIVITY): Troponin I (High Sensitivity): 3231 ng/L (ref <18)

## 2021-06-03 MED ORDER — ISOSORBIDE MONONITRATE ER 60 MG PO TB24: 60.0000 mg | ORAL_TABLET | Freq: Every day | ORAL | 0 refills | Status: DC

## 2021-06-03 MED ORDER — NYSTATIN 100000 UNIT/GM EX POWD: CUTANEOUS | 0 refills | Status: DC

## 2021-06-03 MED ORDER — CLOPIDOGREL BISULFATE 75 MG PO TABS: 75.0000 mg | ORAL_TABLET | Freq: Every day | ORAL | 0 refills | Status: AC

## 2021-06-03 MED ORDER — TERBINAFINE HCL 1 % EX CREA: TOPICAL_CREAM | CUTANEOUS | 0 refills | Status: DC

## 2021-06-03 MED ORDER — SODIUM CHLORIDE 0.9 % IV BOLUS
500.0000 mL | Freq: Once | INTRAVENOUS | Status: AC
Start: 2021-06-03 — End: 2021-06-03
  Administered 2021-06-03: 500 mL via INTRAVENOUS

## 2021-06-03 NOTE — Discharge Summary (Signed)
[facility] [place] [facility]   PATIENT NAME: Lindsey Pollard    MR#:  [id number]  DATE OF BIRTH:  12/10/1940  DATE OF ADMISSION:  06/01/2021 ADMITTING PHYSICIAN: Alwyn Pea, MD  DATE OF DISCHARGE: 06/03/2021  3:10 PM  PRIMARY CARE PHYSICIAN: Lauro Regulus, MD    ADMISSION DIAGNOSIS:  Acute pulmonary edema (HCC) [J81.0] Acute respiratory failure with hypoxia (HCC) [J96.01] ST elevation myocardial infarction (STEMI), unspecified artery (HCC) [I21.3] Non-STEMI (non-ST elevated myocardial infarction) (HCC) [I21.4]  DISCHARGE DIAGNOSIS:  Principal Problem:   Non-STEMI (non-ST elevated myocardial infarction) (HCC) Active Problems:   Bilateral hearing loss   CAD (coronary artery disease)   PVD (peripheral vascular disease) (HCC)   GERD (gastroesophageal reflux disease)   Ischemic cardiomyopathy   Type 2 diabetes mellitus with hyperlipidemia (HCC)   Essential hypertension   Chronic diastolic CHF (congestive heart failure) (HCC)   Fungal infection of the groin   Fungal infection of skin   SECONDARY DIAGNOSIS:   Past Medical History:  Diagnosis Date  . Diabetes mellitus without complication (HCC)   . Hyperlipidemia   . Hypertension   . Myocardial infarction [facility])     HOSPITAL COURSE:   NSTEMI.  On day of admission patient was brought to the cardiac Cath Lab.  Please see cath report by Dr. Juliann Pares.  Ejection fraction normal range.  Cardiology recommended medical management.  Heparin drip was on for 48 hours then discontinued.  Patient is on aspirin, atorvastatin and metoprolol.  Imdur and Plavix added to the patient's outpatient regimen and prescriptions provided.  Patient's LDL was 49.  Patient ambulated without any chest pain or shortness of breath and wanted to go home.  Inpatient cardiac rehab Acute kidney injury postcardiac cath.  Creatinine 0.8 on presentation and peaked at 1.57.  I gave a fluid bolus and held torsemide.  Repeat creatinine  1.37.  Patient did not want to stay in the hospital any further.  We will hold torsemide for 3 days upon going home.  Hold lisinopril.  Recommend following up with Dr. Dareen Piano for repeat BMP. Chronic diastolic heart failure.  Hold Demadex for a few days with acute kidney injury.  Continue metoprolol.  Hold lisinopril until repeat BMP. Type 2 diabetes mellitus with hyperlipidemia.  Hemoglobin A1c 6.7.  Patient was on sliding scale insulin here but all sugars have been in the low 100s.  Can hold off on insulin upon disposition. Fungal infection in groin.  Will prescribe nystatin powder Fungal infection heels.  Terbinafine cream Acute hypoxic respiratory failure on presentation with a pulse ox of 87% on room air.  Patient improved during the hospital course and is off oxygen completely. Anemia unspecified.  Recommend follow-up as outpatient.  Hemoglobin 9.0 upon discharge  DISCHARGE CONDITIONS:   Satisfactory  CONSULTS OBTAINED:  Treatment Team:  Marcina Millard, MD  DRUG ALLERGIES:   Allergies  Allergen Reactions  . Other Swelling  . Tetanus Toxoids Swelling    Tetanus and diphtheria toxids  . Penicillin G Rash    DISCHARGE MEDICATIONS:   Allergies as of 06/03/2021       Reactions   Other Swelling   Tetanus Toxoids Swelling   Tetanus and diphtheria toxids   Penicillin G Rash        Medication List     STOP taking these medications    amLODipine 5 MG tablet Commonly known as: NORVASC   B-D UF III MINI PEN NEEDLES 31G X 5 MM Misc Generic drug:  Insulin Pen Needle   insulin glargine 100 UNIT/ML injection Commonly known as: LANTUS   insulin lispro 100 UNIT/ML KwikPen Commonly known as: HUMALOG   lisinopril 5 MG tablet Commonly known as: ZESTRIL   torsemide 20 MG tablet Commonly known as: DEMADEX       TAKE these medications    aspirin 81 MG EC tablet Take by mouth.   atorvastatin 80 MG tablet Commonly known as: LIPITOR Take 80 mg by mouth every  evening.   baclofen 10 MG tablet Commonly known as: LIORESAL Take 10 mg by mouth 3 (three) times daily.   Cholecalciferol 25 MCG (1000 UT) capsule Take 1,000 Units by mouth 2 (two) times daily.   clopidogrel 75 MG tablet Commonly known as: PLAVIX Take 1 tablet (75 mg total) by mouth daily. Start taking on: June 04, 2021   isosorbide mononitrate 60 MG 24 hr tablet Commonly known as: IMDUR Take 1 tablet (60 mg total) by mouth daily. Start taking on: June 04, 2021   metoprolol succinate 50 MG 24 hr tablet Commonly known as: TOPROL-XL Take 50 mg by mouth daily.   nitroGLYCERIN 0.4 MG SL tablet Commonly known as: NITROSTAT DISSOLVE 1 TABLET UNDER THE TONGUE EVERY 5 MINUTES AS  NEEDED FOR CHEST PAIN. MAX  OF 3 TABLETS IN 15 MINUTES. CALL 911 IF PAIN PERSISTS.   nystatin powder Commonly known as: MYCOSTATIN/NYSTOP Apply three times a day under skin folds in groin   pramipexole 0.25 MG tablet Commonly known as: MIRAPEX Take 1 tablet by mouth 3 (three) times daily.   senna 8.6 MG tablet Commonly known as: SENOKOT Take 2 tablets by mouth at bedtime.   terbinafine 1 % cream Commonly known as: LAMISIL Apply twice a day to heels         DISCHARGE INSTRUCTIONS:   Follow-up PMD 5 days Follow-up cardiology 1 week  If you experience worsening of your admission symptoms, develop shortness of breath, life threatening emergency, suicidal or homicidal thoughts you must seek medical attention immediately by calling 911 or calling your MD immediately  if symptoms less severe.  You Must read complete instructions/literature along with all the possible adverse reactions/side effects for all the Medicines you take and that have been prescribed to you. Take any new Medicines after you have completely understood and accept all the possible adverse reactions/side effects.   Please note  You were cared for by a hospitalist during your hospital stay. If you have any questions  about your discharge medications or the care you received while you were in the hospital after you are discharged, you can call the unit and asked to speak with the hospitalist on call if the hospitalist that took care of you is not available. Once you are discharged, your primary care physician will handle any further medical issues. Please note that NO REFILLS for any discharge medications will be authorized once you are discharged, as it is imperative that you return to your primary care physician (or establish a relationship with a primary care physician if you do not have one) for your aftercare needs so that they can reassess your need for medications and monitor your lab values.    Today   CHIEF COMPLAINT:   Chief Complaint  Patient presents with  . Chest Pain    HISTORY OF PRESENT ILLNESS:  Lindsey Pollard  is a 80 y.o. female came in with chest pain and found to have NSTEMI   VITAL SIGNS:  Blood pressure (!) 133/45, pulse  60, temperature 98.1 F (36.7 C), temperature source Oral, resp. rate 13, height  (1.499 m), weight 66.2 kg, SpO2 96 %.  I/O:   Intake/Output Summary (Last 24 hours) at 06/03/2021 1739 Last data filed at 06/03/2021 1400 Gross per 24 hour  Intake 900.67 ml  Output 450 ml  Net 450.67 ml    PHYSICAL EXAMINATION:  GENERAL:  80 y.o.-year-old patient lying in the bed with no acute distress.  EYES: Pupils equal, round, reactive to light and accommodation. No scleral icterus.  HEENT: Head atraumatic, normocephalic. Oropharynx and nasopharynx clear.  LUNGS: Normal breath sounds bilaterally, no wheezing, rales,rhonchi or crepitation. No use of accessory muscles of respiration.  CARDIOVASCULAR: S1, S2 normal. No murmurs, rubs, or gallops.  ABDOMEN: Soft, non-tender, non-distended.  EXTREMITIES: No pedal edema.  NEUROLOGIC: Cranial nerves II through XII are intact. Muscle strength 5/5 in all extremities. Sensation intact. Gait not checked.  PSYCHIATRIC: The  patient is alert and oriented x 3.  SKIN: No obvious rash, lesion, or ulcer.   DATA REVIEW:   CBC Recent Labs  Lab 06/03/21 0527  WBC 6.6  HGB 9.0*  HCT 27.1*  PLT 188    Chemistries  Recent Labs  Lab 06/01/21 0551 06/02/21 0547 06/03/21 1210  NA 136   < > 130*  K 5.2*   < > 4.3  CL 105   < > 98  CO2 22   < > 24  GLUCOSE 173*   < > 112*  BUN 29*   < > 33*  CREATININE 0.88   < > 1.37*  CALCIUM 9.2   < > 8.3*  MG 2.8*  --   --   AST 18  --   --   ALT 13  --   --   ALKPHOS 135*  --   --   BILITOT 1.1  --   --    < > = values in this interval not displayed.     Microbiology Results  Results for orders placed or performed during the hospital encounter of 06/01/21  Resp Panel by RT-PCR (Flu A&B, Covid) Nasopharyngeal Swab     Status: None   Collection Time: 06/01/21  5:50 AM   Specimen: Nasopharyngeal Swab; Nasopharyngeal(NP) swabs in vial transport medium  Result Value Ref Range Status   SARS Coronavirus 2 by RT PCR NEGATIVE NEGATIVE Final    Comment: (NOTE) SARS-CoV-2 target nucleic acids are NOT DETECTED.  The SARS-CoV-2 RNA is generally detectable in upper respiratory specimens during the acute phase of infection. The lowest concentration of SARS-CoV-2 viral copies this assay can detect is 138 copies/mL. A negative result does not preclude SARS-Cov-2 infection and should not be used as the sole basis for treatment or other patient management decisions. A negative result may occur with  improper specimen collection/handling, submission of specimen other than nasopharyngeal swab, presence of viral mutation(s) within the areas targeted by this assay, and inadequate number of viral copies(<138 copies/mL). A negative result must be combined with clinical observations, patient history, and epidemiological information. The expected result is Negative.  Fact Sheet for Patients:  BloggerCourse.com  Fact Sheet for Healthcare Providers:   SeriousBroker.it  This test is no t yet approved or cleared by the [place] FDA and  has been authorized for detection and/or diagnosis of SARS-CoV-2 by FDA under an Emergency Use Authorization (EUA). This EUA will remain  in effect (meaning this test can be used) for the duration of the COVID-19 declaration under Section 564(b)(1)  of the Act, 21 U.S.C.section 360bbb-3(b)(1), unless the authorization is terminated  or revoked sooner.       Influenza A by PCR NEGATIVE NEGATIVE Final   Influenza B by PCR NEGATIVE NEGATIVE Final    Comment: (NOTE) The Xpert Xpress SARS-CoV-2/FLU/RSV plus assay is intended as an aid in the diagnosis of influenza from Nasopharyngeal swab specimens and should not be used as a sole basis for treatment. Nasal washings and aspirates are unacceptable for Xpert Xpress SARS-CoV-2/FLU/RSV testing.  Fact Sheet for Patients: BloggerCourse.com  Fact Sheet for Healthcare Providers: SeriousBroker.it  This test is not yet approved or cleared by the [place] FDA and has been authorized for detection and/or diagnosis of SARS-CoV-2 by FDA under an Emergency Use Authorization (EUA). This EUA will remain in effect (meaning this test can be used) for the duration of the COVID-19 declaration under Section 564(b)(1) of the Act, 21 U.S.C. section 360bbb-3(b)(1), unless the authorization is terminated or revoked.  Performed at [facility], [address] [place], [place] [address]   MRSA Next Gen by PCR, Nasal     Status: None   Collection Time: 06/01/21  8:40 AM   Specimen: Nasal Mucosa; Nasal Swab  Result Value Ref Range Status   MRSA by PCR Next Gen NOT DETECTED NOT DETECTED Final    Comment: (NOTE) The GeneXpert MRSA Assay (FDA approved for NASAL specimens only), is one component of a comprehensive MRSA colonization surveillance program. It is not intended to  diagnose MRSA infection nor to guide or monitor treatment for MRSA infections. Test performance is not FDA approved in patients less than 37 years old. Performed at [facility], [address], [place], [place] [address]       Management plans discussed with the patient, family and they are in agreement.  CODE STATUS:     Code Status Orders  (From admission, onward)           Start     Ordered   06/01/21 0918  Do not attempt resuscitation (DNR)  Continuous       Question Answer Comment  In the event of cardiac or respiratory ARREST Do not call a "code blue"   In the event of cardiac or respiratory ARREST Do not perform Intubation, CPR, defibrillation or ACLS   In the event of cardiac or respiratory ARREST Use medication by any route, position, wound care, and other measures to relive pain and suffering. May use oxygen, suction and manual treatment of airway obstruction as needed for comfort.   Comments CODE STATUS was discussed with patient and she is a DO NOT RESUSCITATE.      06/01/21 0917           Code Status History     Date Active Date Inactive Code Status Order ID Comments User Context   06/01/2021 0831 06/01/2021 0917 Full Code [id number]  Alwyn Pea, MD Inpatient       TOTAL TIME TAKING CARE OF THIS PATIENT: 35 minutes.    Alford Highland M.D on 06/03/2021 at 5:39 PM  [facility]  CC: Primary care physician; Lauro Regulus, MD

## 2021-06-03 NOTE — Discharge Instructions (Signed)
Hold torsemide for three days then can resume Hold lisinopril until repeat labs done  Rec checking BMP with follow up appointment

## 2021-06-03 NOTE — Consult Note (Signed)
ANTICOAGULATION CONSULT NOTE  Pharmacy Consult for IV Heparin Indication: chest pain/ACS  Patient Measurements: Height: 4\' 11"  (149.9 cm) Weight: 66.2 kg (145 lb 15.1 oz) IBW/kg (Calculated) : 43.2 Heparin Dosing Weight: 57.7 kg  Labs: Recent Labs    06/01/21 0551 06/01/21 0930 06/01/21 2037 06/02/21 0547 06/02/21 1407 06/03/21 0018  HGB 11.7*  --   --  10.0*  --   --   HCT 36.3  --   --  30.5*  --   --   PLT 262  --   --  199  --   --   APTT 32  --   --   --   --   --   LABPROT 12.8  --   --   --   --   --   INR 1.0  --   --   --   --   --   HEPARINUNFRC  --   --    < > 0.47 0.27* 0.49  CREATININE 0.88  --   --  1.04*  --   --   TROPONINIHS 15 2,670*  --   --   --   --    < > = values in this interval not displayed.     Estimated Creatinine Clearance: 36.3 mL/min (A) (by C-G formula based on SCr of 1.04 mg/dL (H)).   Medications:  No anticoagulation prior to admission per my chart review  Assessment: Patient is a 80 y/o F with medical history including HTN, diabetes, CAD who presented to the ED 12/8 early AM with acute chest pain refractory to NTG that started around 2 AM. Patient taken emergently for cardiac catheterization. Pharmacy consulted to re-initiate heparin post cardiac catheterization.  Baseline CBC acceptable. Baseline aPTT and PT-INR within normal limits.  Goal of Therapy:  Heparin level 0.3-0.7 units/ml Monitor platelets by anticoagulation protocol: Yes   Plan:  12/10:  HL @ 0018 = 0.49, therapeutic X 1  Will continue this pt on current rate and draw confirmation level in 8 hrs.  --Daily CBC per protocol while on IV heparin  Ishana Blades D, PharmD, BCPS Clinical Pharmacist   06/03/2021,1:08 AM

## 2021-06-03 NOTE — Progress Notes (Signed)
[facility]  SUBJECTIVE: Patient laying in bed, denies chest pain or shortness of breath   Vitals:   06/02/21 2000 06/02/21 2300 06/03/21 0300 06/03/21 0700  BP: (!) 124/40 (!) 137/42 (!) 135/40 (!) 129/41  Pulse: 62 65 65 70  Resp: 15 15 15 13   Temp: 98.9 F (37.2 C) 98.6 F (37 C)    TempSrc: Oral Oral    SpO2: 94% 93% 91% 94%  Weight:      Height:         Intake/Output Summary (Last 24 hours) at 06/03/2021 0943 Last data filed at 06/03/2021 [id number] Gross per 24 hour  Intake 481.11 ml  Output 100 ml  Net 381.11 ml      PHYSICAL EXAM  General: Well developed, well nourished, in no acute distress HEENT:  Normocephalic and atramatic Neck:  No JVD.  Lungs: Clear bilaterally to auscultation and percussion. Heart: HRRR . Normal S1 and S2 without gallops or murmurs.  Abdomen: Bowel sounds are positive, abdomen soft and non-tender  Msk:  Back normal, normal gait. Normal strength and tone for age. Extremities: No clubbing, cyanosis or edema.   Neuro: Alert and oriented X 3. Psych:  Good affect, responds appropriately   LABS: Basic Metabolic Panel: Recent Labs    06/01/21 0551 06/02/21 0547 06/03/21 0527  NA 136 133* 136  K 5.2* 4.4 4.1  CL 105 100 101  CO2 22 27 25   GLUCOSE 173* 105* 99  BUN 29* 24* 33*  CREATININE 0.88 1.04* 1.57*  CALCIUM 9.2 8.5* 8.6*  MG 2.8*  --   --    Liver Function Tests: Recent Labs    06/01/21 0551  AST 18  ALT 13  ALKPHOS 135*  BILITOT 1.1  PROT 7.1  ALBUMIN 4.0   No results for input(s): LIPASE, AMYLASE in the last 72 hours. CBC: Recent Labs    06/01/21 0551 06/02/21 0547 06/03/21 0527  WBC 11.2* 6.7 6.6  NEUTROABS 6.0  --   --   HGB 11.7* 10.0* 9.0*  HCT 36.3 30.5* 27.1*  MCV 97.6 94.7 94.1  PLT 262 199 188   Cardiac Enzymes: No results for input(s): CKTOTAL, CKMB, CKMBINDEX, TROPONINI in the last 72 hours. BNP: Invalid input(s): POCBNP D-Dimer: No results for input(s): DDIMER in the last 72  hours. Hemoglobin A1C: Recent Labs    06/01/21 0930  HGBA1C 6.7*   Fasting Lipid Panel: Recent Labs    06/01/21 0551  CHOL 127  HDL 47  LDLCALC 49  TRIG 154*  CHOLHDL 2.7   Thyroid Function Tests: No results for input(s): TSH, T4TOTAL, T3FREE, THYROIDAB in the last 72 hours.  Invalid input(s): FREET3 Anemia Panel: No results for input(s): VITAMINB12, FOLATE, FERRITIN, TIBC, IRON, RETICCTPCT in the last 72 hours.  ECHOCARDIOGRAM COMPLETE  Result Date: 06/01/2021    ECHOCARDIOGRAM REPORT   Patient Name:   Lindsey Pollard [facility] Date of Exam: 06/01/2021 Medical Rec #:  [id number]     Height:       59.0 in Accession #:    [id number]    Weight:       146.0 lb Date of Birth:  06/20/1941     BSA:          1.613 m Patient Age:    89 years      BP:           138/54 mmHg Patient Gender: F             HR:  88 bpm. Exam Location:  [facility] Procedure: 2D Echo, Cardiac Doppler and Color Doppler Indications:     I21.9 Acute myocardial infarction I21.9  History:         Patient has no prior history of Echocardiogram examinations.                  Previous Myocardial Infarction; Risk Factors:Hypertension,                  Diabetes and Dyslipidemia.  Sonographer:     Cristela Blue Referring Phys:  [id number] DWAYNE D CALLWOOD Diagnosing Phys: Marcina Millard MD  Sonographer Comments: Suboptimal apical window. IMPRESSIONS  1. Left ventricular ejection fraction, by estimation, is 45 to 50%. The left ventricle has mildly decreased function. The left ventricle has no regional wall motion abnormalities. The left ventricular internal cavity size was mildly dilated. Left ventricular diastolic parameters are consistent with Grade I diastolic dysfunction (impaired relaxation).  2. Right ventricular systolic function is normal. The right ventricular size is normal.  3. The mitral valve is normal in structure. Mild mitral valve regurgitation. No evidence of mitral stenosis.  4. The aortic valve is normal in structure. Aortic  valve regurgitation is mild to moderate. Mild aortic valve stenosis.  5. The inferior vena cava is normal in size with greater than 50% respiratory variability, suggesting right atrial pressure of 3 mmHg. FINDINGS  Left Ventricle: Left ventricular ejection fraction, by estimation, is 45 to 50%. The left ventricle has mildly decreased function. The left ventricle has no regional wall motion abnormalities. The left ventricular internal cavity size was mildly dilated. There is no left ventricular hypertrophy. Left ventricular diastolic parameters are consistent with Grade I diastolic dysfunction (impaired relaxation). Right Ventricle: The right ventricular size is normal. No increase in right ventricular wall thickness. Right ventricular systolic function is normal. Left Atrium: Left atrial size was normal in size. Right Atrium: Right atrial size was normal in size. Pericardium: There is no evidence of pericardial effusion. Mitral Valve: The mitral valve is normal in structure. Mild mitral valve regurgitation. No evidence of mitral valve stenosis. MV peak gradient, 4.4 mmHg. The mean mitral valve gradient is 2.0 mmHg. Tricuspid Valve: The tricuspid valve is normal in structure. Tricuspid valve regurgitation is not demonstrated. No evidence of tricuspid stenosis. Aortic Valve: The aortic valve is normal in structure. Aortic valve regurgitation is mild to moderate. Aortic regurgitation PHT measures 394 msec. Mild aortic stenosis is present. Aortic valve mean gradient measures 9.8 mmHg. Aortic valve peak gradient measures 16.8 mmHg. Aortic valve area, by VTI measures 1.21 cm. Pulmonic Valve: The pulmonic valve was normal in structure. Pulmonic valve regurgitation is not visualized. No evidence of pulmonic stenosis. Aorta: The aortic root is normal in size and structure. Venous: The inferior vena cava is normal in size with greater than 50% respiratory variability, suggesting right atrial pressure of 3 mmHg. IAS/Shunts:  No atrial level shunt detected by color flow Doppler.  LEFT VENTRICLE PLAX 2D LVIDd:         4.70 cm   Diastology LVIDs:         2.90 cm   LV e' medial:    4.90 cm/s LV PW:         1.20 cm   LV E/e' medial:  14.6 LV IVS:        0.90 cm   LV e' lateral:   5.98 cm/s LVOT diam:     2.00 cm   LV E/e' lateral: 12.0 LV  SV:         60 LV SV Index:   37 LVOT Area:     3.14 cm  LEFT ATRIUM             Index        RIGHT ATRIUM           Index LA diam:        3.60 cm 2.23 cm/m   RA Area:     11.70 cm LA Vol (A2C):   70.7 ml 43.82 ml/m  RA Volume:   25.50 ml  15.80 ml/m LA Vol (A4C):   52.6 ml 32.60 ml/m LA Biplane Vol: 61.7 ml 38.24 ml/m  AORTIC VALVE                     PULMONIC VALVE AV Area (Vmax):    1.29 cm      PV Vmax:        0.88 m/s AV Area (Vmean):   1.05 cm      PV Vmean:       63.900 cm/s AV Area (VTI):     1.21 cm      PV VTI:         0.172 m AV Vmax:           204.75 cm/s   PV Peak grad:   3.1 mmHg AV Vmean:          145.250 cm/s  PV Mean grad:   2.0 mmHg AV VTI:            0.492 m       RVOT Peak grad: 4 mmHg AV Peak Grad:      16.8 mmHg AV Mean Grad:      9.8 mmHg LVOT Vmax:         84.20 cm/s LVOT Vmean:        48.700 cm/s LVOT VTI:          0.190 m LVOT/AV VTI ratio: 0.39 AI PHT:            394 msec  AORTA Ao Root diam: 2.70 cm MITRAL VALVE                TRICUSPID VALVE MV Area (PHT): 2.42 cm     TR Peak grad:   29.2 mmHg MV Area VTI:   2.23 cm     TR Vmax:        270.00 cm/s MV Peak grad:  4.4 mmHg MV Mean grad:  2.0 mmHg     SHUNTS MV Vmax:       1.05 m/s     Systemic VTI:  0.19 m MV Vmean:      59.8 cm/s    Systemic Diam: 2.00 cm MV Decel Time: 314 msec     Pulmonic VTI:  0.207 m MV E velocity: 71.60 cm/s MV A velocity: 102.00 cm/s MV E/A ratio:  0.70 Isaias Cowman MD Electronically signed by Isaias Cowman MD Signature Date/Time: 06/01/2021/3:54:17 PM    Final      Echo LVEF 45 to 50%  TELEMETRY: Sinus rhythm:  ASSESSMENT AND PLAN:  Principal Problem:   Non-STEMI (non-ST  elevated myocardial infarction) (HCC) Active Problems:   Bilateral hearing loss   CAD (coronary artery disease)   PVD (peripheral vascular disease) (HCC)   GERD (gastroesophageal reflux disease)   Ischemic cardiomyopathy   Type 2 diabetes mellitus with hyperlipidemia (HCC)   Essential hypertension   Chronic diastolic CHF (  congestive heart failure) (HCC)   Fungal infection of the groin   Fungal infection of skin    1. Non-ST elevation myocardial infarction, emergent cardiac catheterization 06/01/2021 revealing 95% calcified stenosis left main, occluded mid LAD, patent stent RCA, patent LIMA to LAD, occluded SVG to OM1, best felt to be treated medically, observed 48-hour on heparin drip, without recurrent chest pain, high-sensitivity troponin 15, 2670, 3231 2.  Chronic diastolic congestive heart failure, on torsemide 3.  Essential hypertension, blood pressure well controlled on metoprolol succinate 4.  Type 2 diabetes 5.  Chronic kidney disease, stage III, BUN/creatinine 33 and 1.57, respectively   Recommendations   1.  Agree with current therapy 2.  DC heparin 3.  Continue metoprolol succinate 4.  Continue torsemide 5.  Continue isosorbide mononitrate 60 mg daily 6.  Continue clopidogrel 75 mg daily 7.  Ambulate patient today 8.  May consider discharge home later today or in a.m. 9.  Follow-up as outpatient in 1 to 2 weeks   Isaias Cowman, MD, PhD, [facility] 06/03/2021 9:43 AM

## 2021-06-03 NOTE — Plan of Care (Signed)

## 2021-06-03 NOTE — Consult Note (Signed)
ANTICOAGULATION CONSULT NOTE  Pharmacy Consult for IV Heparin Indication: chest pain/ACS  Patient Measurements: Height: 4\' 11"  (149.9 cm) Weight: 66.2 kg (145 lb 15.1 oz) IBW/kg (Calculated) : 43.2 Heparin Dosing Weight: 57.7 kg  Labs: Recent Labs    06/01/21 0551 06/01/21 0930 06/01/21 2037 06/02/21 0547 06/02/21 1407 06/03/21 0018 06/03/21 0527 06/03/21 0747  HGB 11.7*  --   --  10.0*  --   --  9.0*  --   HCT 36.3  --   --  30.5*  --   --  27.1*  --   PLT 262  --   --  199  --   --  188  --   APTT 32  --   --   --   --   --   --   --   LABPROT 12.8  --   --   --   --   --   --   --   INR 1.0  --   --   --   --   --   --   --   HEPARINUNFRC  --   --    < > 0.47 0.27* 0.49  --  0.49  CREATININE 0.88  --   --  1.04*  --   --  1.57*  --   TROPONINIHS 15 2,670*  --   --   --   --  3,231*  --    < > = values in this interval not displayed.     Estimated Creatinine Clearance: 24 mL/min (A) (by C-G formula based on SCr of 1.57 mg/dL (H)).   Medications:  No anticoagulation prior to admission per my chart review  Assessment: Patient is a 80 y/o F with medical history including HTN, diabetes, CAD who presented to the ED 12/8 early AM with acute chest pain refractory to NTG that started around 2 AM. Patient taken emergently for cardiac catheterization. Pharmacy consulted to re-initiate heparin post cardiac catheterization.  Baseline CBC acceptable. Baseline aPTT and PT-INR within normal limits.  Goal of Therapy:  Heparin level 0.3-0.7 units/ml Monitor platelets by anticoagulation protocol: Yes   Plan:  12/10:  HL @ 0747 = 0.49, therapeutic X 2  Will continue this pt on current rate Recheck HL with AM labs  --Daily CBC per protocol while on IV heparin  14/10, PharmD Clinical Pharmacist   06/03/2021,9:21 AM

## 2021-06-20 LAB — BLOOD GAS, ARTERIAL
Acid-base deficit: 4.7 mmol/L — ABNORMAL HIGH (ref 0.0–2.0)
Bicarbonate: 21.1 mmol/L (ref 20.0–28.0)
Delivery systems: POSITIVE
Expiratory PAP: 5
FIO2: 35
Inspiratory PAP: 10
O2 Saturation: 97.4 %
Patient temperature: 37
pCO2 arterial: 41 mmHg (ref 32.0–48.0)
pH, Arterial: 7.32 — ABNORMAL LOW (ref 7.350–7.450)
pO2, Arterial: 103 mmHg (ref 83.0–108.0)

## 2021-09-20 ENCOUNTER — Other Ambulatory Visit: Payer: Self-pay | Admitting: Orthopedic Surgery

## 2021-09-20 DIAGNOSIS — M5136 Other intervertebral disc degeneration, lumbar region: Secondary | ICD-10-CM

## 2021-09-20 DIAGNOSIS — S32040A Wedge compression fracture of fourth lumbar vertebra, initial encounter for closed fracture: Secondary | ICD-10-CM

## 2021-09-20 DIAGNOSIS — M47816 Spondylosis without myelopathy or radiculopathy, lumbar region: Secondary | ICD-10-CM

## 2021-09-25 ENCOUNTER — Ambulatory Visit: Payer: Medicare HMO

## 2021-10-03 ENCOUNTER — Other Ambulatory Visit: Payer: Self-pay

## 2021-10-03 ENCOUNTER — Emergency Department
Admission: EM | Admit: 2021-10-03 | Discharge: 2021-10-03 | Disposition: A | Discharge status: Home / Self Care | Payer: Medicare Other | Attending: Student in an Organized Health Care Education/Training Program | Admitting: Student in an Organized Health Care Education/Training Program

## 2021-10-03 ENCOUNTER — Emergency Department: Payer: Medicare Other

## 2021-10-03 ENCOUNTER — Ambulatory Visit: Admission: RE | Admit: 2021-10-03 | Payer: Medicare Other | Source: Ambulatory Visit

## 2021-10-03 DIAGNOSIS — E119 Type 2 diabetes mellitus without complications: Secondary | ICD-10-CM | POA: Diagnosis not present

## 2021-10-03 DIAGNOSIS — R778 Other specified abnormalities of plasma proteins: Secondary | ICD-10-CM | POA: Diagnosis not present

## 2021-10-03 DIAGNOSIS — I10 Essential (primary) hypertension: Secondary | ICD-10-CM | POA: Diagnosis not present

## 2021-10-03 DIAGNOSIS — R200 Anesthesia of skin: Secondary | ICD-10-CM | POA: Diagnosis present

## 2021-10-03 DIAGNOSIS — R202 Paresthesia of skin: Secondary | ICD-10-CM | POA: Insufficient documentation

## 2021-10-03 LAB — CBC
HCT: 31.1 % — ABNORMAL LOW (ref 36.0–46.0)
Hemoglobin: 10.3 g/dL — ABNORMAL LOW (ref 12.0–15.0)
MCH: 29.8 pg (ref 26.0–34.0)
MCHC: 33.1 g/dL (ref 30.0–36.0)
MCV: 89.9 fL (ref 80.0–100.0)
Platelets: 226 10*3/uL (ref 150–400)
RBC: 3.46 MIL/uL — ABNORMAL LOW (ref 3.87–5.11)
RDW: 13.5 % (ref 11.5–15.5)
WBC: 5.7 10*3/uL (ref 4.0–10.5)
nRBC: 0 % (ref 0.0–0.2)

## 2021-10-03 LAB — URINALYSIS, ROUTINE W REFLEX MICROSCOPIC
Bilirubin Urine: NEGATIVE
Glucose, UA: NEGATIVE mg/dL
Hgb urine dipstick: NEGATIVE
Ketones, ur: NEGATIVE mg/dL
Leukocytes,Ua: NEGATIVE
Nitrite: NEGATIVE
Protein, ur: NEGATIVE mg/dL
Specific Gravity, Urine: 1.006 (ref 1.005–1.030)
pH: 6 (ref 5.0–8.0)

## 2021-10-03 LAB — BASIC METABOLIC PANEL
Anion gap: 12 (ref 5–15)
BUN: 49 mg/dL — ABNORMAL HIGH (ref 8–23)
CO2: 27 mmol/L (ref 22–32)
Calcium: 9.1 mg/dL (ref 8.9–10.3)
Chloride: 96 mmol/L — ABNORMAL LOW (ref 98–111)
Creatinine, Ser: 1.42 mg/dL — ABNORMAL HIGH (ref 0.44–1.00)
GFR, Estimated: 38 mL/min — ABNORMAL LOW (ref >60)
Glucose, Bld: 114 mg/dL — ABNORMAL HIGH (ref 70–99)
Potassium: 3.7 mmol/L (ref 3.5–5.1)
Sodium: 135 mmol/L (ref 135–145)

## 2021-10-03 LAB — TROPONIN I (HIGH SENSITIVITY)
Troponin I (High Sensitivity): 16 ng/L (ref <18)
Troponin I (High Sensitivity): 26 ng/L — ABNORMAL HIGH (ref <18)
Troponin I (High Sensitivity): 28 ng/L — ABNORMAL HIGH (ref <18)

## 2021-10-03 IMAGING — MR MR HEAD W/O CM
12 series · 48 of 48 positions shown · non-contrast
Comparison: CT head [DATE]

CLINICAL DATA: Acute neuro deficit.  Right-sided numbness

EXAM:
MRI HEAD WITHOUT CONTRAST
TECHNIQUE: Multiplanar, multiecho pulse sequences of the brain and surrounding
structures were obtained without intravenous contrast.

[Series 5: ax dwi_tracew · axial · 3.0mm · 0.65mm/px · z∈[-131,+23]mm · 3 of 48 slices shown]
[im 1/48]
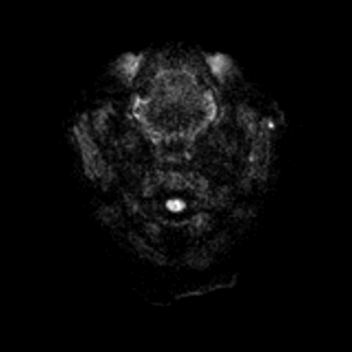
[im 24/48]
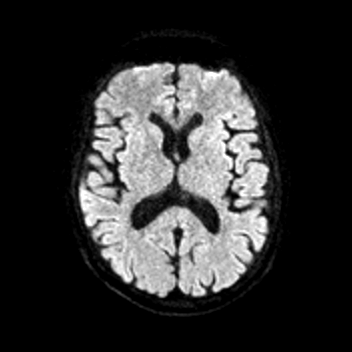
[im 48/48]
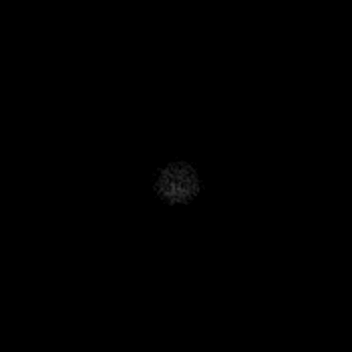

[Series 6: ax dwi_adc · axial · 3.0mm · 0.65mm/px · z∈[-131,+23]mm · 4 of 48 slices shown]
[im 1/48]
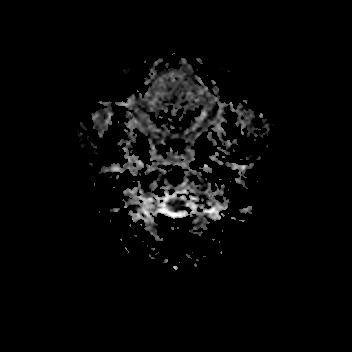
[im 16/48]
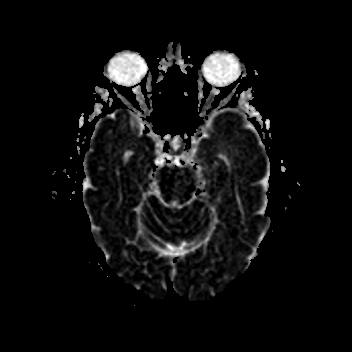
[im 32/48]
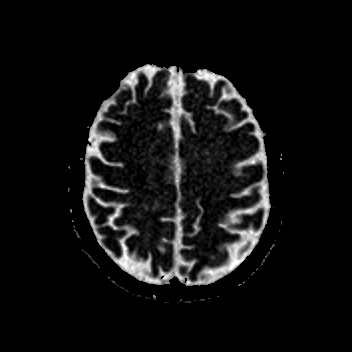
[im 48/48]
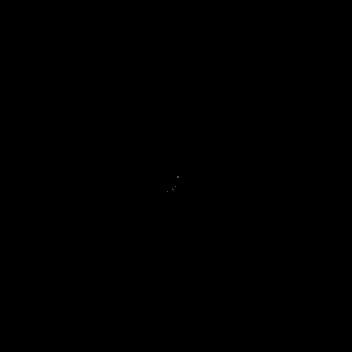

[Series 7: cor dwi_tracew · coronal · 5.0mm · 0.65mm/px · 3 of 38 slices shown]
[im 1/38]
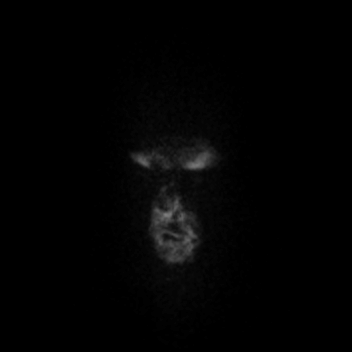
[im 19/38]
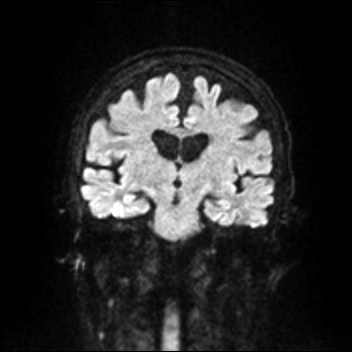
[im 38/38]
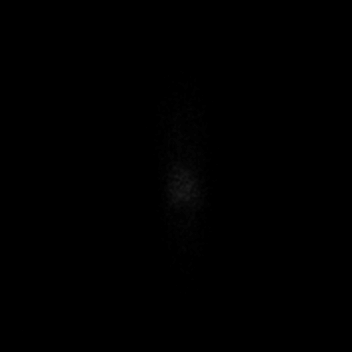

[Series 8: cor dwi_adc · coronal · 5.0mm · 0.65mm/px · 3 of 36 slices shown]
[im 1/36]
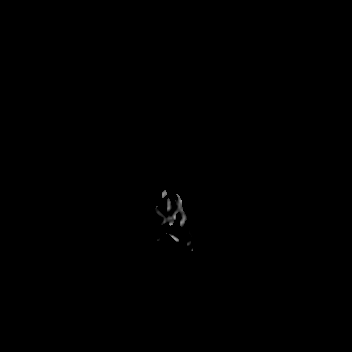
[im 18/36]
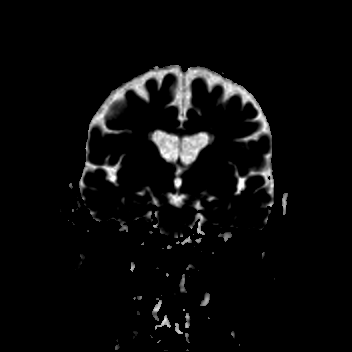
[im 36/36]
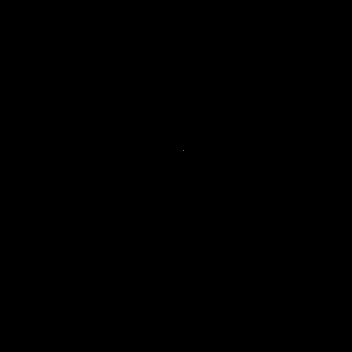

[Series 9: T1 · sagittal · 5.0mm · 0.62mm/px · 2 of 23 slices shown (1 of 2)]
[im 1/23]
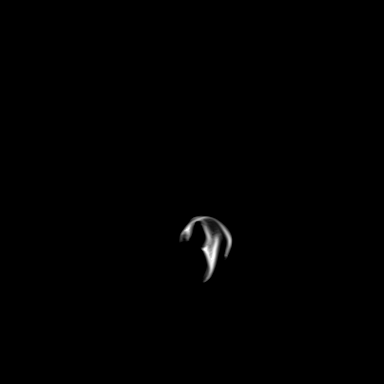
[im 23/23]
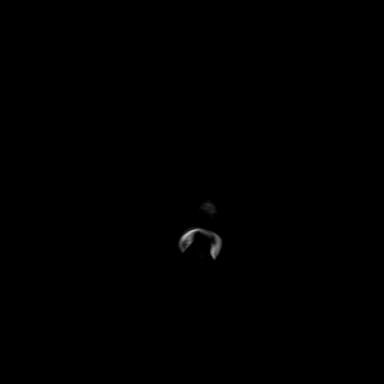

[Series 10: T2 · axial · 5.0mm · 0.53mm/px · z∈[-122,+21]mm · 2 of 25 slices shown (1 of 2)]
[im 1/25]
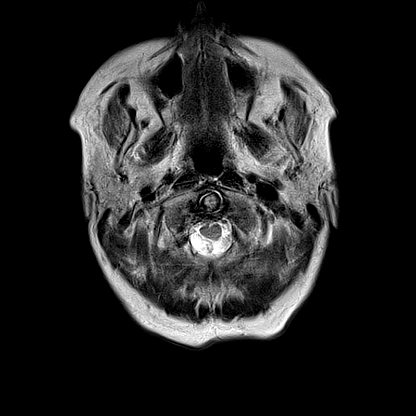
[im 25/25]
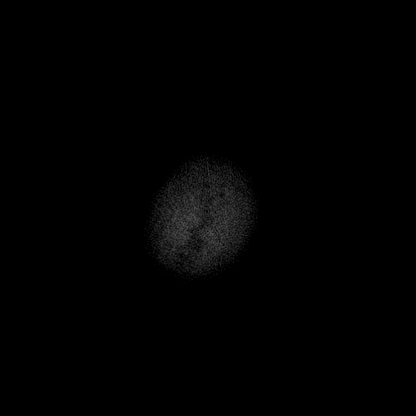

[Series 11: mag_images · axial · 3.0mm · 0.90mm/px · z∈[-139,+36]mm · 4 of 60 slices shown]
[im 1/60]
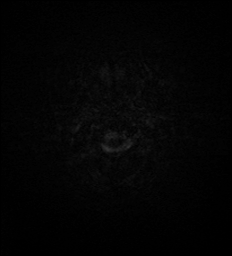
[im 20/60]
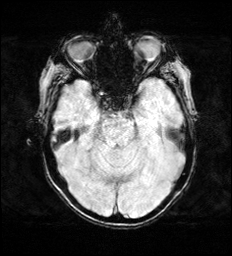
[im 40/60]
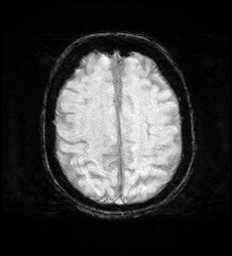
[im 60/60]
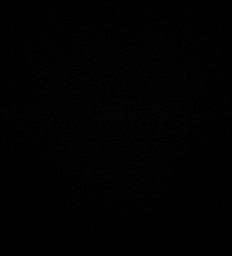

[Series 12: pha_images · axial · 3.0mm · 0.90mm/px · z∈[-139,+36]mm · 4 of 56 slices shown]
[im 1/56]
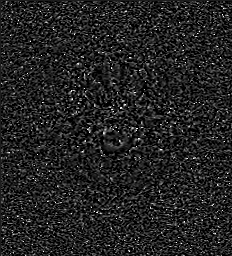
[im 19/56]
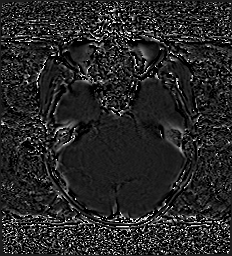
[im 37/56]
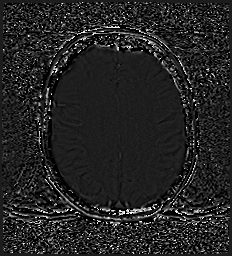
[im 56/56]
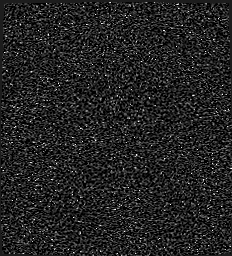

[Series 13: swi_images · axial · 3.0mm · 0.90mm/px · z∈[-139,+36]mm · 4 of 60 slices shown]
[im 1/60]
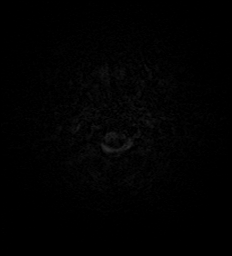
[im 20/60]
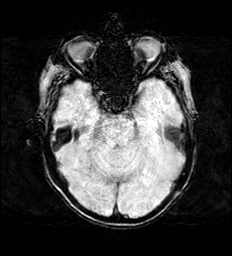
[im 40/60]
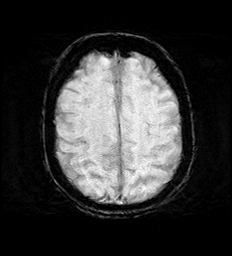
[im 60/60]
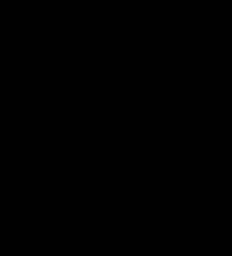

[Series 15: FLAIR · axial · 3.0mm · 0.53mm/px · z∈[-131,+30]mm · 4 of 55 slices shown]
[im 1/55]
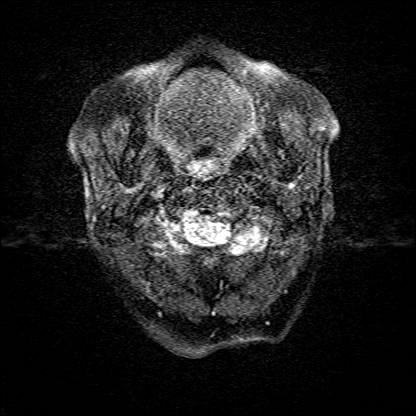
[im 19/55]
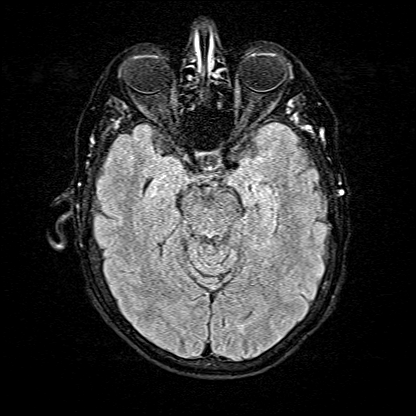
[im 37/55]
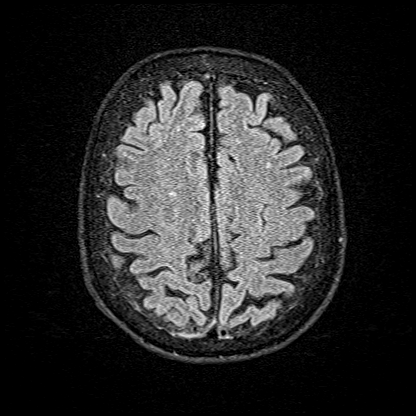
[im 55/55]
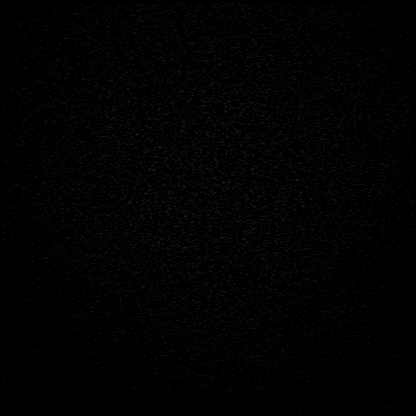

[Series 16: T1 · axial · 1.0mm · 0.98mm/px · z∈[-142,+32]mm · 13 of 176 slices shown (2 of 2)]
[im 1/176]
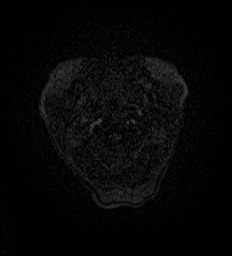
[im 15/176]
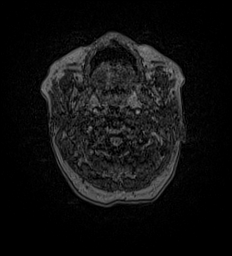
[im 30/176]
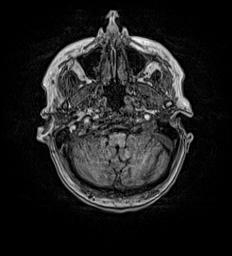
[im 44/176]
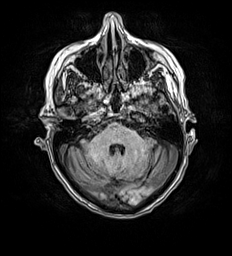
[im 59/176]
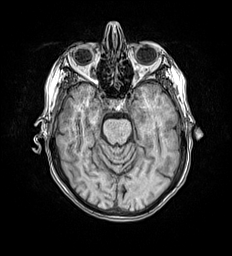
[im 73/176]
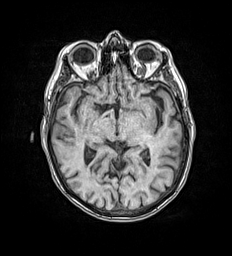
[im 88/176]
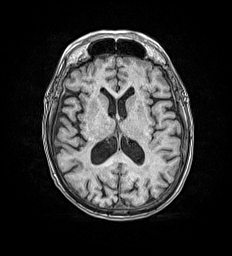
[im 103/176]
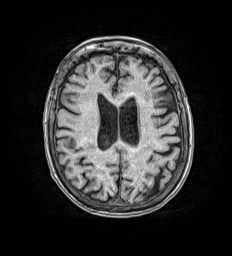
[im 117/176]
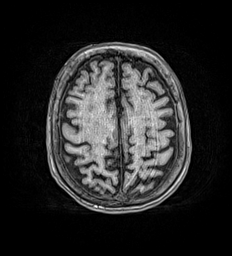
[im 132/176]
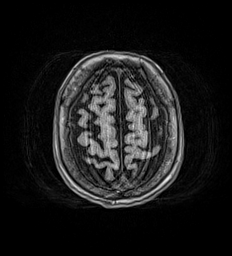
[im 146/176]
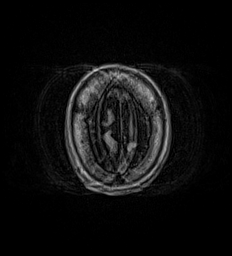
[im 161/176]
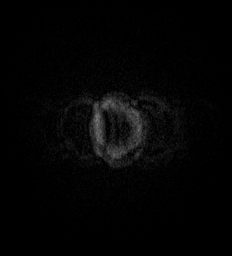
[im 176/176]
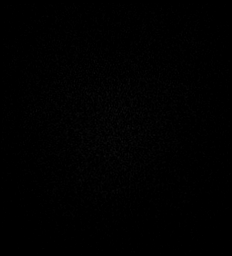

[Series 17: T2 · coronal · 5.0mm · 0.57mm/px · 2 of 29 slices shown (2 of 2)]
[im 1/29]
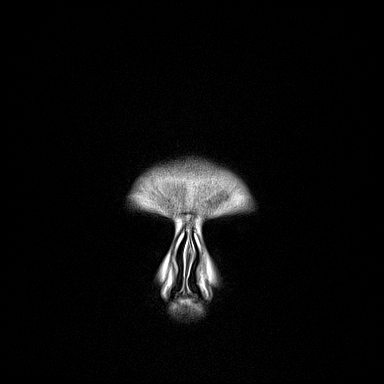
[im 29/29]
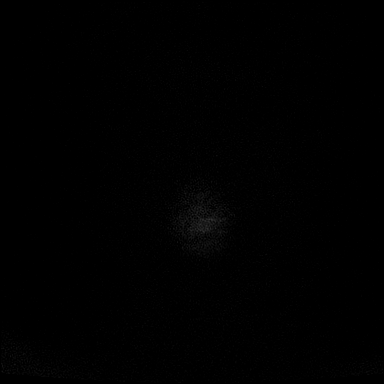

[48 of 48 positions shown; findings below may reference images not displayed]

FINDINGS: Brain: No acute infarction, hemorrhage, hydrocephalus, extra-axial
collection or mass lesion.

Cerebral volume normal for age. Mild atrophy. Mild white matter
changes with scattered small white matter hyperintensities
bilaterally. Mild hyperintensity in the pons bilaterally.

Mild motion degrades some of the sequences.

Vascular: Negative for hyperdense vessel

Skull and upper cervical spine: No focal lesion

Sinuses/Orbits: Mild mucosal edema paranasal sinuses. Bilateral
cataract extraction

Other: None
IMPRESSION: No acute abnormality

Mild chronic microvascular ischemic change in the white matter and
pons.

## 2021-10-03 IMAGING — CT CT HEAD W/O CM
4 series · 17 of 47 positions shown, 19 images · non-contrast
Comparison: None.

CLINICAL DATA: Numbness or tingling, paresthesia (Ped 0-17y) a



[Series 2: head wo · axial · 0.41mm/px · z∈[-200,-80]mm · 7 of 32 slices shown, 9 images]
[im 4/32  brain]
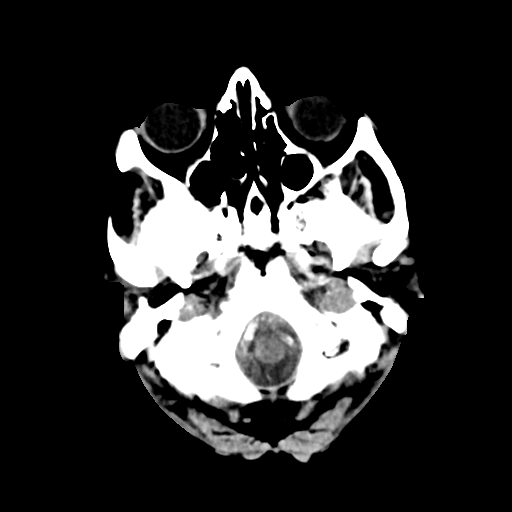
[im 4/32  bone]
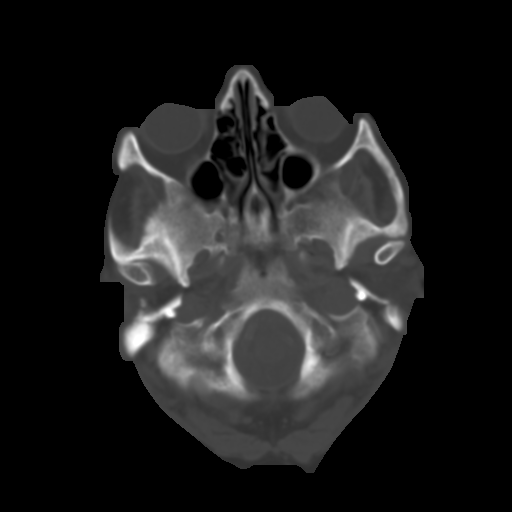
[im 8/32  brain]
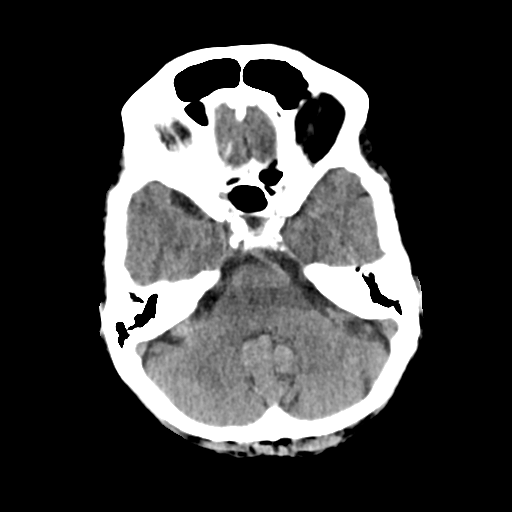
[im 12/32  brain]
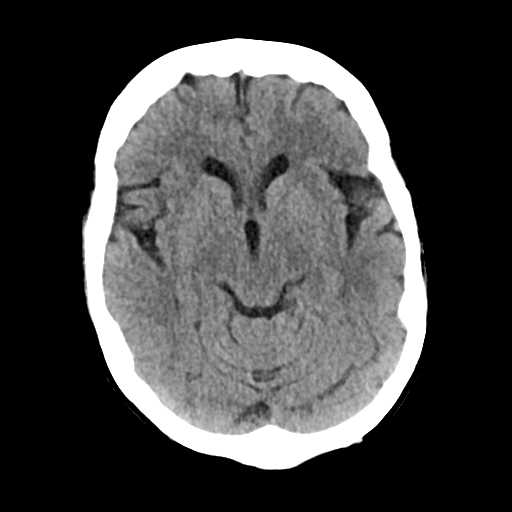
[im 16/32  brain]
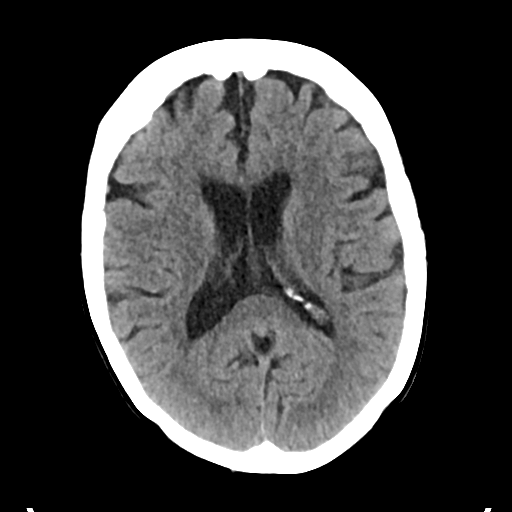
[im 20/32  brain]
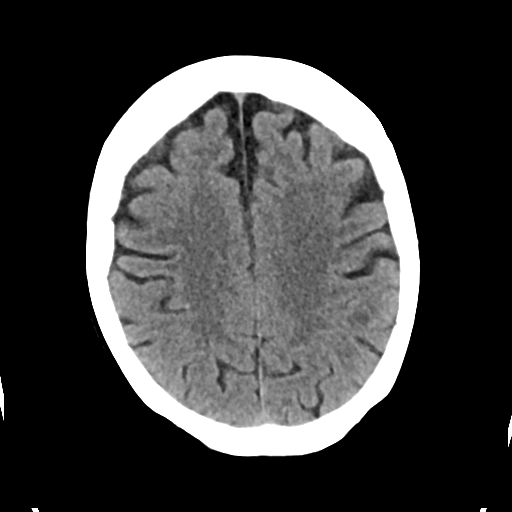
[im 20/32  bone]
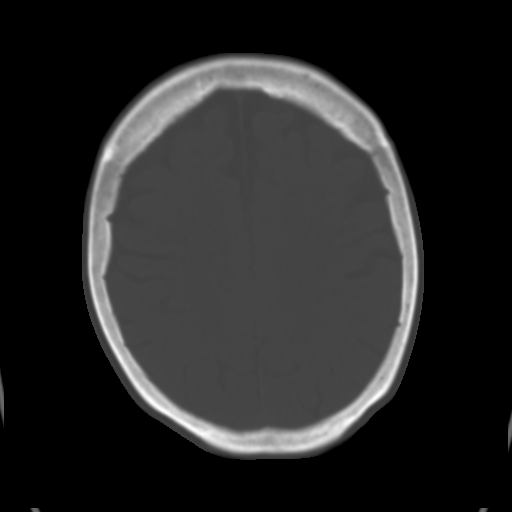
[im 24/32  brain]
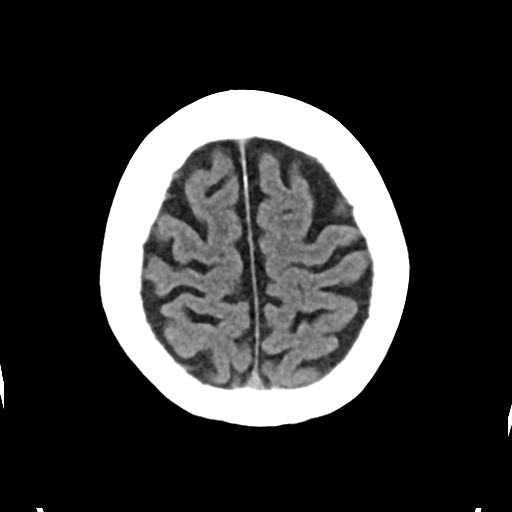
[im 28/32  brain]
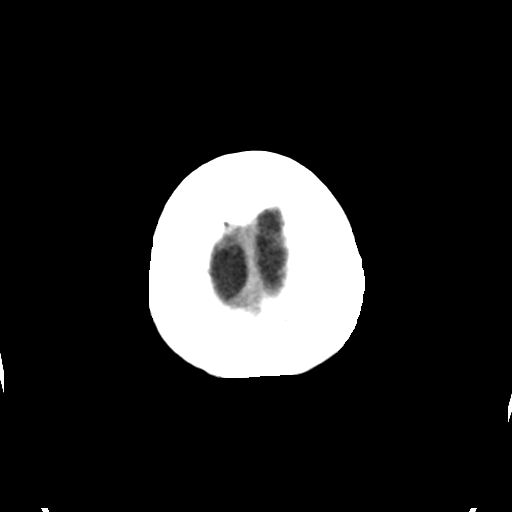

[Series 3: head bone · axial · 0.41mm/px · z∈[-201,-145]mm · 4 of 79 slices shown]
[im 8/79  bone]
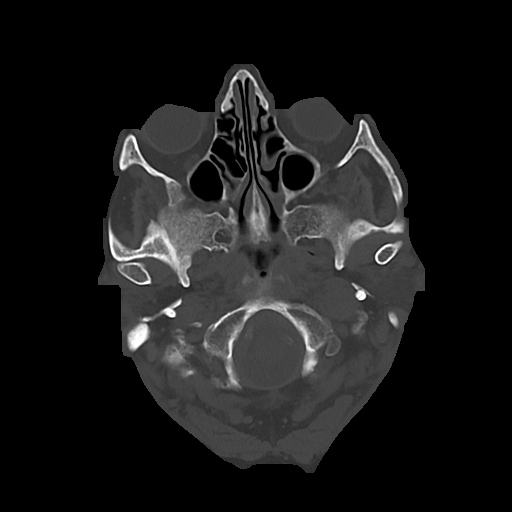
[im 16/79  bone]
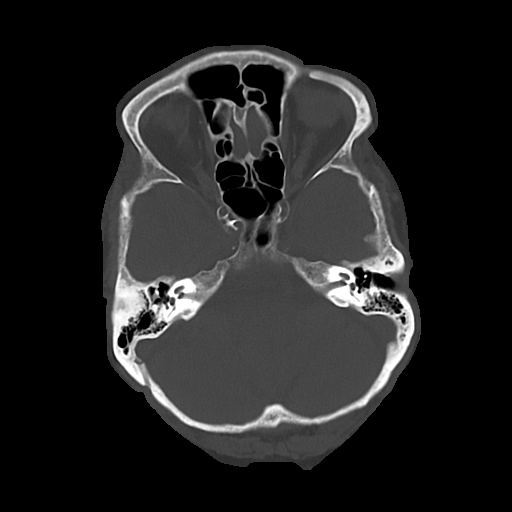
[im 24/79  bone]
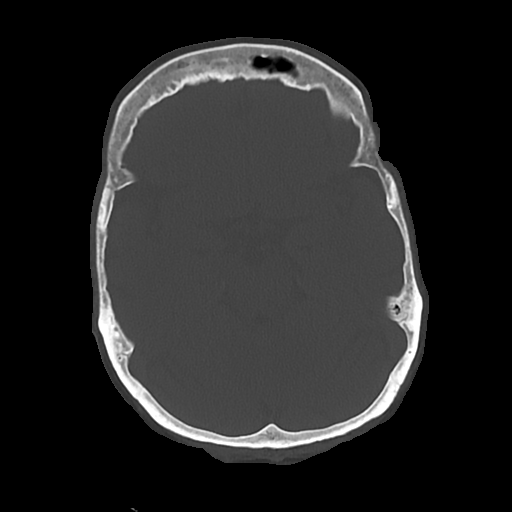
[im 36/79  bone]
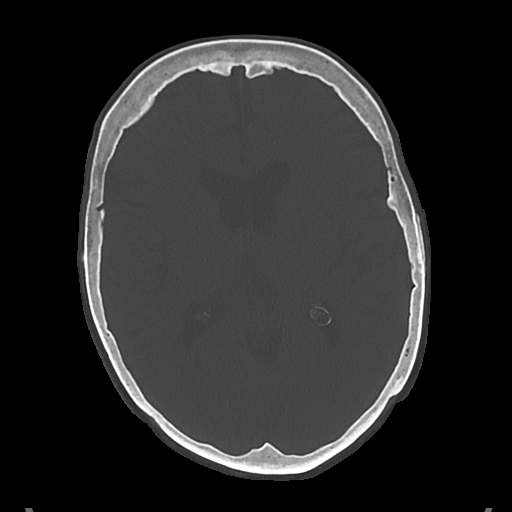

[Series 4: cor soft · coronal · 0.33mm/px · 3 of 64 slices shown]
[im 22/64  brain]
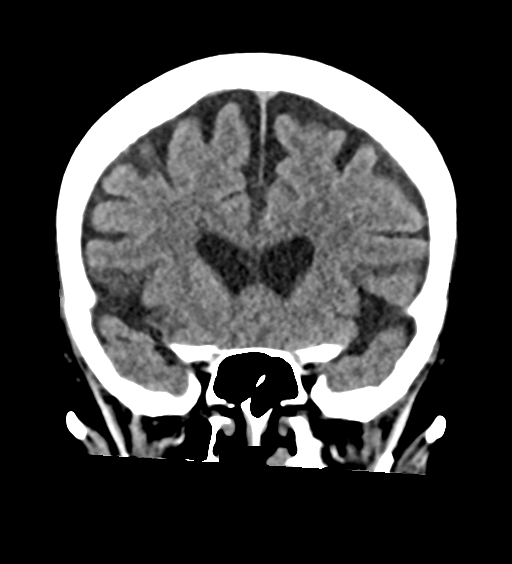
[im 29/64  brain]
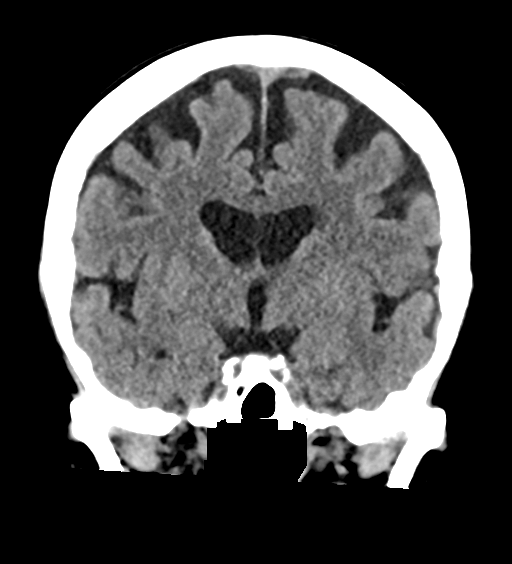
[im 36/64  brain]
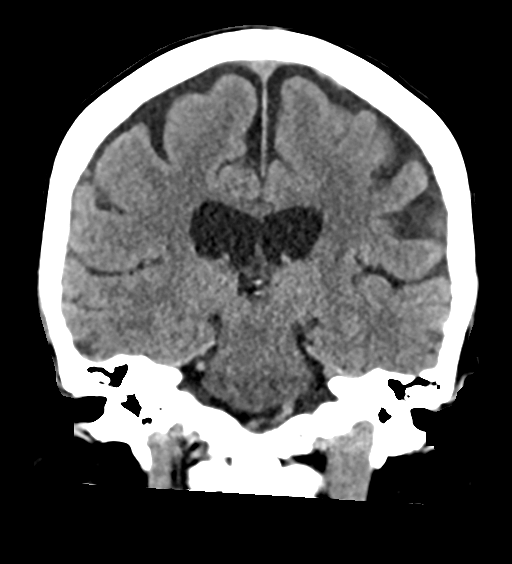

[Series 5: sag soft · sagittal · 0.35mm/px · 3 of 54 slices shown]
[im 18/54  brain]
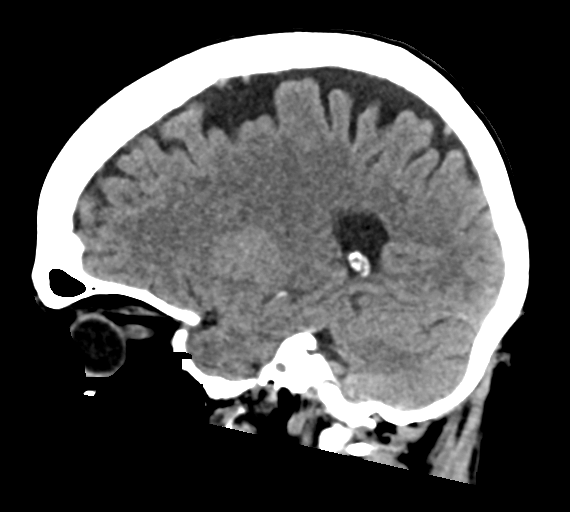
[im 27/54  brain]
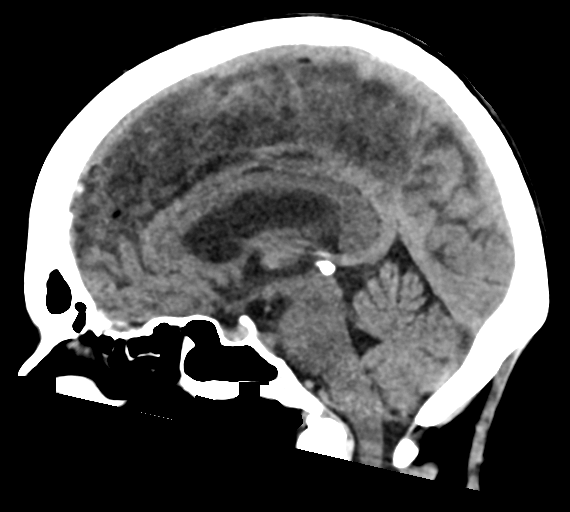
[im 36/54  brain]
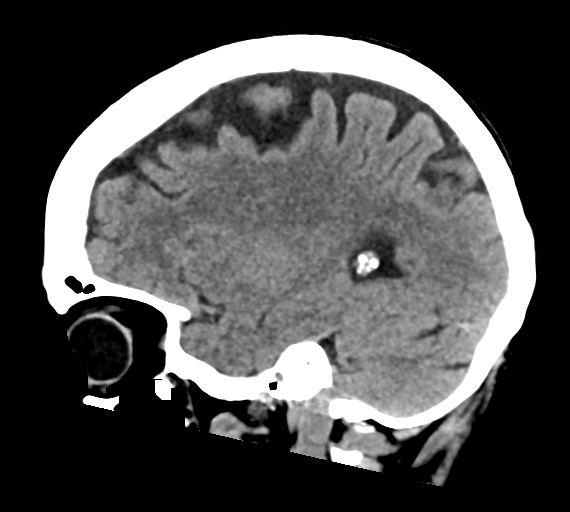

[17 of 47 positions shown; findings below may reference images not displayed]

FINDINGS: Brain: No evidence of acute large vascular territory infarction,
hemorrhage, hydrocephalus, extra-axial collection or mass
lesion/mass effect.

Vascular: No hyperdense vessel identified. Calcific intracranial
atherosclerosis.

Skull: No acute fracture.

Sinuses/Orbits: Clear sinuses.  No acute orbital findings.

Other: No mastoid effusions.
IMPRESSION: No evidence of acute intracranial abnormality.

## 2021-10-03 IMAGING — MR MR CERVICAL SPINE W/O CM
5 series · 40 of 48 positions shown · non-contrast
Comparison: None.

CLINICAL DATA: Ataxia, cervical pathology suspected. Right-sided
numbness

EXAM:
MRI CERVICAL SPINE WITHOUT CONTRAST
TECHNIQUE: Multiplanar, multisequence MR imaging of the cervical spine was
performed. No intravenous contrast was administered.

[Series 5: T2 · sagittal · 3.0mm · 0.62mm/px · 7 of 15 slices shown (1 of 2)]
[im 1/15]
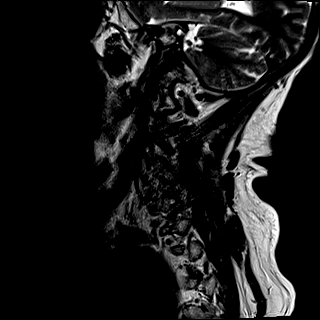
[im 3/15]
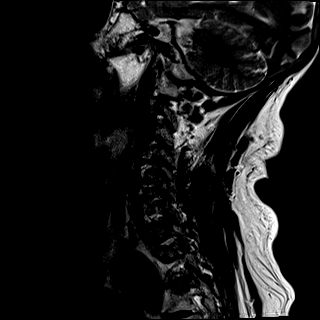
[im 5/15]
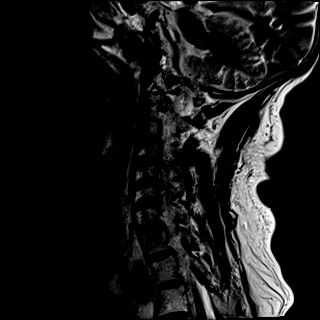
[im 8/15]
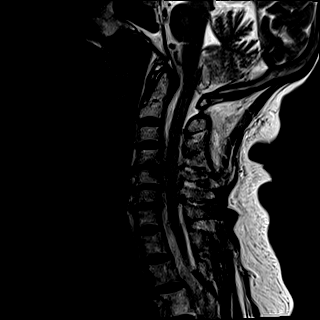
[im 10/15]
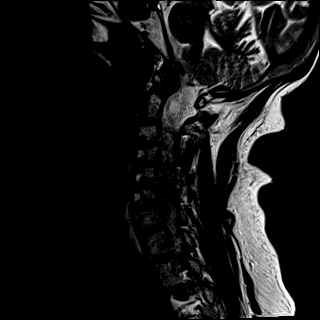
[im 12/15]
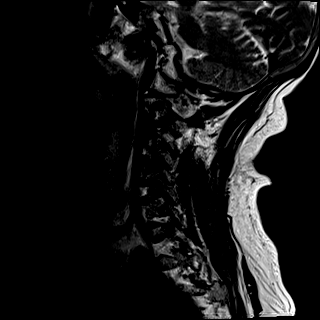
[im 15/15]
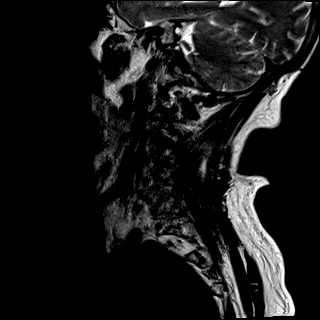

[Series 6: FLAIR · sagittal · 3.0mm · 0.78mm/px · 7 of 15 slices shown]
[im 1/15]
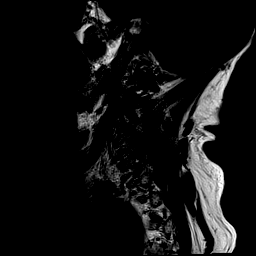
[im 3/15]
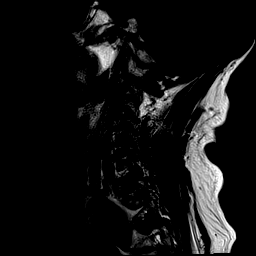
[im 5/15]
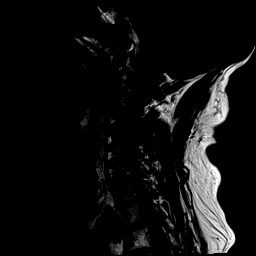
[im 8/15]
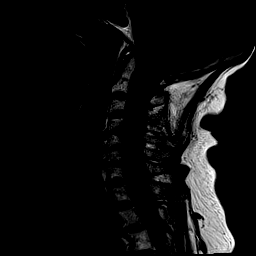
[im 10/15]
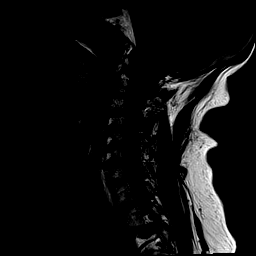
[im 12/15]
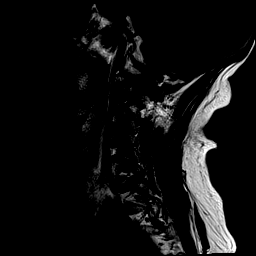
[im 15/15]
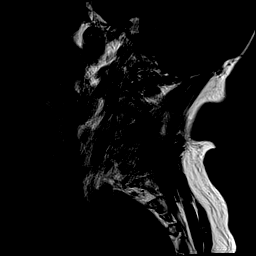

[Series 7: STIR · sagittal · 3.0mm · 0.62mm/px · 7 of 15 slices shown]
[im 1/15]
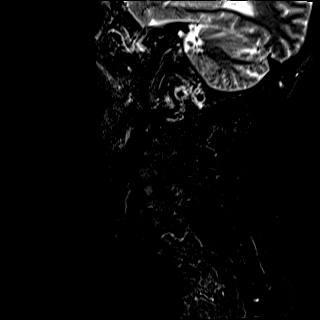
[im 3/15]
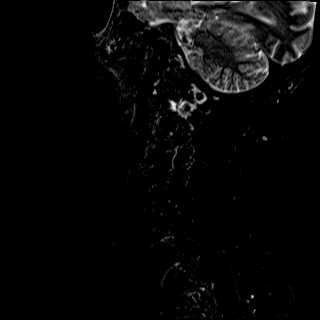
[im 5/15]
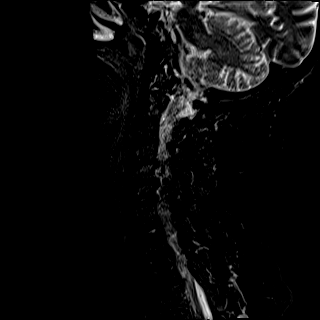
[im 8/15]
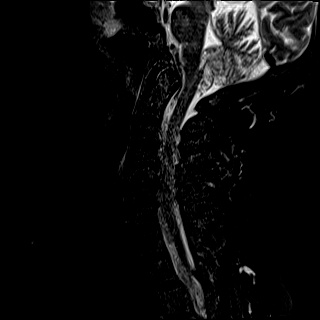
[im 10/15]
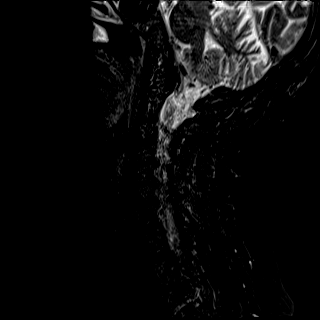
[im 12/15]
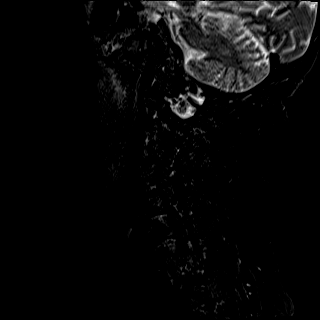
[im 15/15]
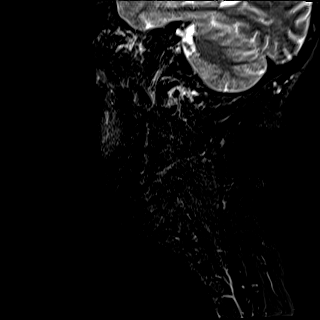

[Series 8: T2 · axial · 3.0mm · 0.70mm/px · z∈[-254,-162]mm · 11 of 28 slices shown (2 of 2)]
[im 1/28]
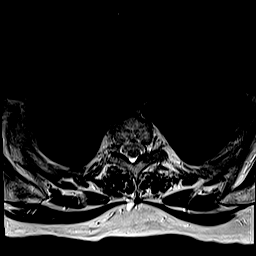
[im 3/28]
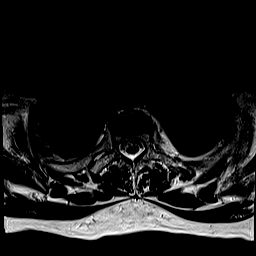
[im 5/28]
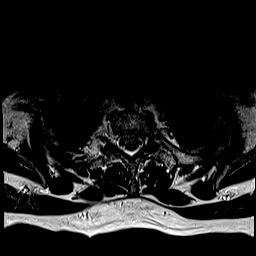
[im 7/28]
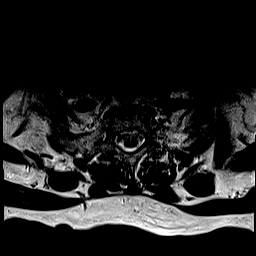
[im 10/28]
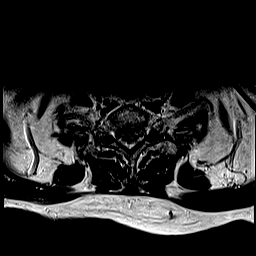
[im 12/28]
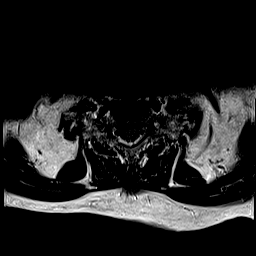
[im 14/28]
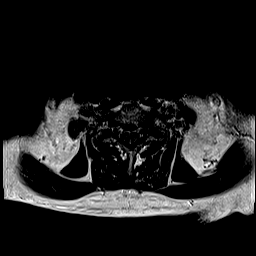
[im 16/28]
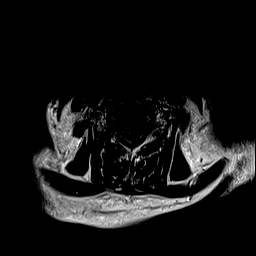
[im 19/28]
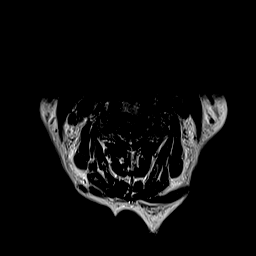
[im 23/28]
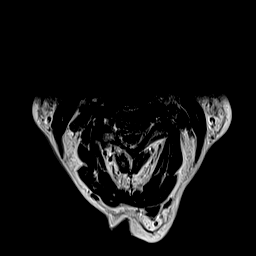
[im 28/28]
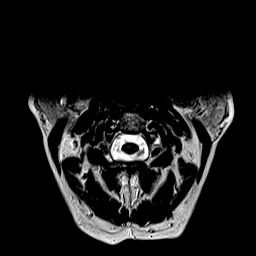

[Series 10: ax mpgr · axial · 3.0mm · 0.35mm/px · z∈[-254,-162]mm · 8 of 29 slices shown]
[im 1/29]
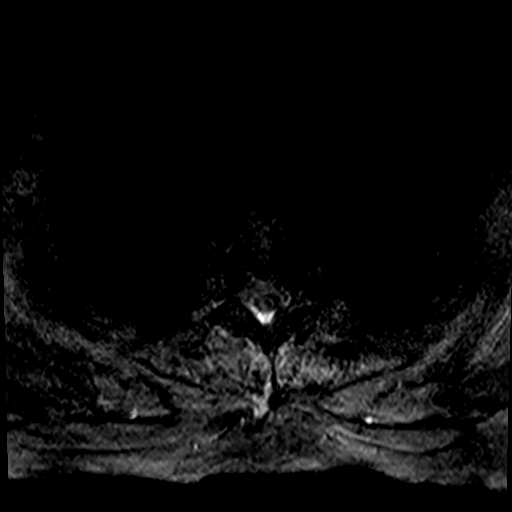
[im 5/29]
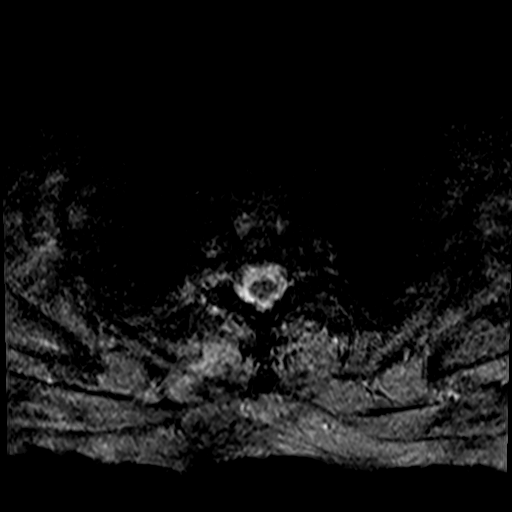
[im 9/29]
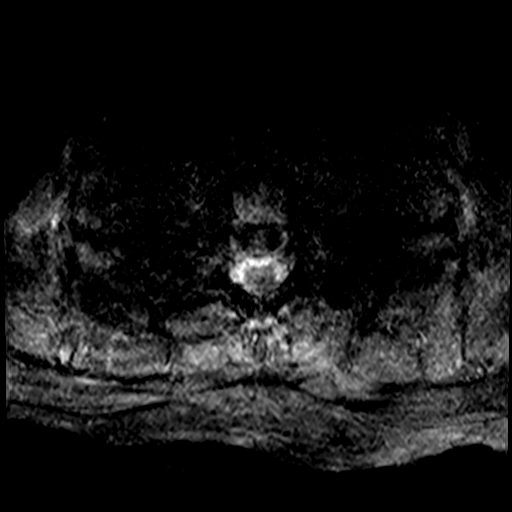
[im 13/29]
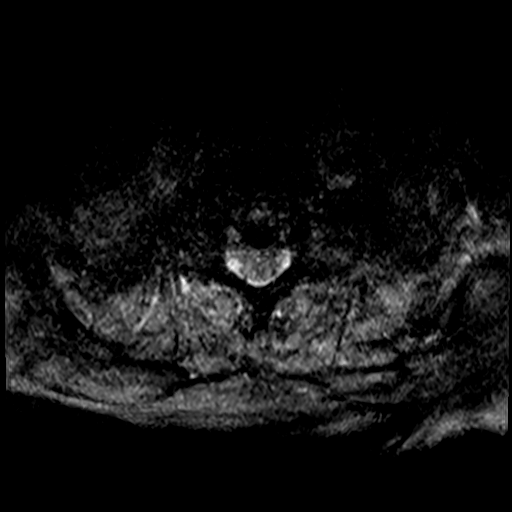
[im 16/29]
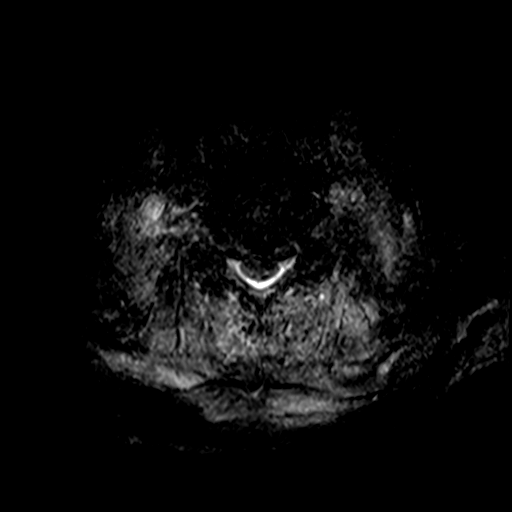
[im 20/29]
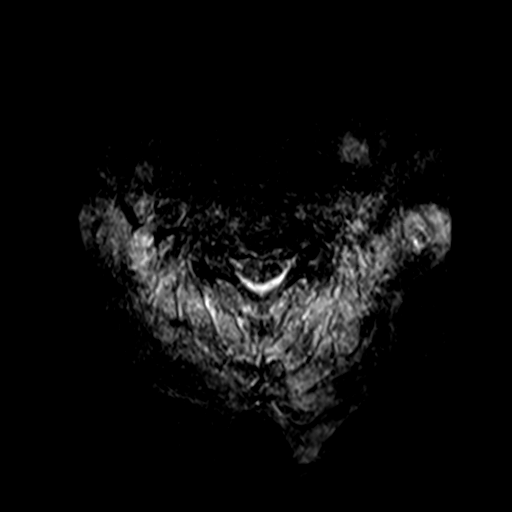
[im 24/29]
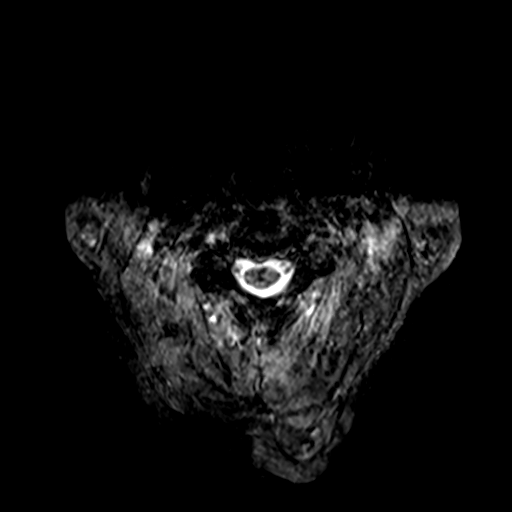
[im 29/29]
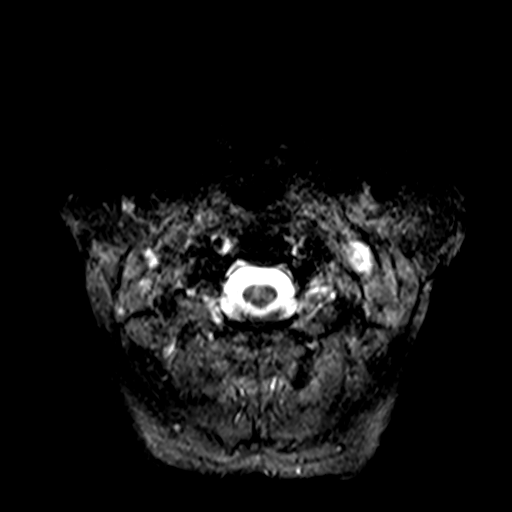

[40 of 48 positions shown; findings below may reference images not displayed]

FINDINGS: Alignment: Mild retrolisthesis C4-5 and C5-6

Vertebrae: Normal bone marrow.  Negative for fracture or mass.

Cord: Cord evaluation limited. Moderate motion degrades image
quality.

Posterior Fossa, vertebral arteries, paraspinal tissues: Negative

Disc levels:

C2-3: Mild right foraminal narrowing due to facet hypertrophy

C3-4: Mild disc and facet degeneration and spurring. No significant
stenosis

C4-5: Disc degeneration and diffuse uncinate spurring. Mild
foraminal narrowing bilaterally.

C5-6: Disc degeneration with diffuse uncinate spurring. Mild
foraminal narrowing bilaterally

C6-7: Disc degeneration and diffuse uncinate spurring. Mild
foraminal narrowing bilaterally

C7-T1: Negative for stenosis
IMPRESSION: Image quality degraded by moderate motion. Limited evaluation of the
cord without definite abnormality

Multilevel spondylosis with spurring and bilateral foraminal
encroachment.

## 2021-10-03 NOTE — ED Provider Notes (Signed)
Initial troponin negative. 2nd troponin minimally elevated. Given minimal elevation did discuss with the patient. 3rd troponin was sent and was essentially unchanged from 2nd. Per chart review she had been seen over the summer last year with roughly the same elevation. Patient continues to deny chest pain. At this time have low suspicion for ACS. Will plan on discharging. Discussed importance of primary care follow up. ?  ?Nance Pear, MD ?10/03/21 2033 ? ?

## 2021-10-03 NOTE — ED Notes (Signed)
Pt taken to MRI  

## 2021-10-03 NOTE — Discharge Instructions (Signed)
Please seek medical attention for any high fevers, chest pain, shortness of breath, change in behavior, persistent vomiting, bloody stool or any other new or concerning symptoms.  

## 2021-10-03 NOTE — ED Provider Notes (Signed)
? ?[facility] ?Provider Note ? ? ? Event Date/Time  ? First MD Initiated Contact with Patient 10/03/21 1210   ?  (approximate) ? ? ?History  ? ?Numbness ? ? ?HPI ? ?Lindsey Pollard is a 81 y.o. female   with a history of diabetes hyperlipidemia hypertension MI PVD presents to the ER due to feeling numbness and tingling on the right side of her body.  Not have any weakness.  States that just feels heavy.  States that she woke up this morning with the symptoms.  Went to sleep last night feeling normal.  Denies any blurry vision.  No headache.  No trouble finding words. ? ?  ? ? ?Physical Exam  ? ?Triage Vital Signs: ?ED Triage Vitals [10/03/21 1115]  ?Enc Vitals Group  ?   BP (!) 168/45  ?   Pulse Rate (!) 53  ?   Resp 16  ?   Temp 97.8 ?F (36.6 ?C)  ?   Temp Source Oral  ?   SpO2 100 %  ?   Weight 134 lb (60.8 kg)  ?   Height 4\' 11"  (1.499 m)  ?   Head Circumference   ?   Peak Flow   ?   Pain Score 0  ?   Pain Loc   ?   Pain Edu?   ?   Excl. in GC?   ? ? ?Most recent vital signs: ?Vitals:  ? 10/03/21 1347 10/03/21 1500  ?BP: (!) 179/47 (!) 176/51  ?Pulse: 63 60  ?Resp: 16 16  ?Temp:    ?SpO2: 98% 95%  ? ? ? ?Constitutional: Alert  ?Eyes: Conjunctivae are normal.  ?Head: Atraumatic. ?Nose: No congestion/rhinnorhea. ?Mouth/Throat: Mucous membranes are moist.   ?Neck: Painless ROM.  ?Cardiovascular:   Good peripheral circulation. ?Respiratory: Normal respiratory effort.  No retractions.  ?Gastrointestinal: Soft and nontender.  ?Musculoskeletal:  no deformity ?Neurologic:  MAE spontaneously. No gross focal neurologic deficits are appreciated.  ?Skin:  Skin is warm, dry and intact. No rash noted. ?Psychiatric: Mood and affect are normal. Speech and behavior are normal. ? ? ? ?ED Results / Procedures / Treatments  ? ?Labs ?(all labs ordered are listed, but only abnormal results are displayed) ?Labs Reviewed  ?BASIC METABOLIC PANEL - Abnormal; Notable for the following components:  ?    Result Value  ?  Chloride 96 (*)   ? Glucose, Bld 114 (*)   ? BUN 49 (*)   ? Creatinine, Ser 1.42 (*)   ? GFR, Estimated 38 (*)   ? All other components within normal limits  ?CBC - Abnormal; Notable for the following components:  ? RBC 3.46 (*)   ? Hemoglobin 10.3 (*)   ? HCT 31.1 (*)   ? All other components within normal limits  ?URINALYSIS, ROUTINE W REFLEX MICROSCOPIC - Abnormal; Notable for the following components:  ? Color, Urine STRAW (*)   ? APPearance CLEAR (*)   ? All other components within normal limits  ?TROPONIN I (HIGH SENSITIVITY)  ? ? ? ?EKG ? ?ED ECG REPORT ?I, 12/03/21, the attending physician, personally viewed and interpreted this ECG. ? ? Date: 10/03/2021 ? EKG Time: 11:21 ? Rate: 50 ? Rhythm: sinus ? Axis: normal ? Intervals: normal ? ST&T Change: nonspecific st abn, no stemi ? ? ? ?RADIOLOGY ?Please see ED Course for my review and interpretation. ? ?I personally reviewed all radiographic images ordered to evaluate for the above acute complaints and  reviewed radiology reports and findings.  These findings were personally discussed with the patient.  Please see medical record for radiology report. ? ? ? ?PROCEDURES: ? ?Critical Care performed: No ? ?Procedures ? ? ?MEDICATIONS ORDERED IN ED: ?Medications - No data to display ? ? ?IMPRESSION / MDM / ASSESSMENT AND PLAN / ED COURSE  ?I reviewed the triage vital signs and the nursing notes. ?             ?               ? ?Differential diagnosis includes, but is not limited to, cva, mass, sdh, acs, electrolyte abn, radiculopathy ? ?Patient p/w right sided tingling and heaviness in right hand.  Symptoms starting last night.  No focal deficits.  Does have history of MI but denying any chest pain or pressure at this time.  CT imaging ordered at triage is negative.  Does not meet criteria for code stroke given onset of time and no focal deficits at this time.  Given her age and risk factors however CT imaging my review of the patient it does not show any  evidence of acute abnormality will order MRI and cervical spine. ? ? ?Clinical Course as of 10/03/21 1519  ?Tue Oct 03, 2021  ?1514 Patient reassessed.  Remains with nonfocal neuro exam.  MRI negative. [PR]  ?  ?Clinical Course User Index ?[PR] Willy Eddy, MD  ? ?Given cardiac history troponins are pending.  Patient be signed out to oncoming physician pending follow-up troponin. ? ? ?FINAL CLINICAL IMPRESSION(S) / ED DIAGNOSES  ? ?Final diagnoses:  ?Numbness  ? ? ? ?Rx / DC Orders  ? ?ED Discharge Orders   ? ? None  ? ?  ? ? ? ?Note:  This document was prepared using Dragon voice recognition software and may include unintentional dictation errors. ? ?  ?Willy Eddy, MD ?10/03/21 1519 ? ?

## 2021-10-03 NOTE — ED Triage Notes (Signed)
Pt here via [facility] with numbness since 1 am this morning. Pt states her entire right side is numb. Pt denies pain but states nausea this morning.  ?

## 2021-10-03 NOTE — ED Notes (Signed)
Pt back from MRI 

## 2021-10-03 NOTE — ED Notes (Signed)
On phone with MRI for screening. ?

## 2021-10-03 NOTE — ED Triage Notes (Signed)
Pt comes ems from home with right sided numbness since about 1am. Pt still endorses the numbness but no weakness present per ems. CBG 143. BP 183/56, HR 62.  ?

## 2021-10-18 NOTE — Progress Notes (Signed)
? ? ? ? ?MRN : [id number] ? ?LUCILL ABRAHAMSEN is a 81 y.o. ([birth year]) female who presents with chief complaint of check carotid arteries. ? ?History of Present Illness:  ? ?The patient is seen for follow up evaluation of carotid stenosis. The carotid stenosis followed by ultrasound.  ?  ?The patient denies amaurosis fugax. There is no recent history of TIA symptoms or focal motor deficits. There is no prior documented CVA. ?  ?The patient is taking enteric-coated aspirin 81 mg daily. ?  ?There is no history of migraine headaches. There is no history of seizures. ?  ?The patient has a history of coronary artery disease, no recent episodes of angina or shortness of breath. ?The patient denies PAD or claudication symptoms. ?There is a history of hyperlipidemia which is being treated with a statin.   ?  ?Carotid Duplex done today shows [place] 123456 stenosis and LICA 123456 stenosis.   ? ?No outpatient medications have been marked as taking for the 10/19/21 encounter (Appointment) with Delana Meyer, Dolores Lory, MD.  ? ? ?Past Medical History:  ?Diagnosis Date  ? Diabetes mellitus without complication ([place])   ? Hyperlipidemia   ? Hypertension   ? Myocardial infarction [facility])   ? ? ?Past Surgical History:  ?Procedure Laterality Date  ? CARDIAC SURGERY    ? bypass  ? CORONARY/GRAFT ACUTE MI REVASCULARIZATION N/A 06/01/2021  ? Procedure: Coronary/Graft Acute MI Revascularization;  Surgeon: Yolonda Kida, MD;  Location: [place] [facility];  Service: Cardiovascular;  Laterality: N/A;  ? EYE SURGERY    ? cataract extraction  ? LEFT HEART CATH AND CORONARY ANGIOGRAPHY N/A 06/01/2021  ? Procedure: LEFT HEART CATH AND CORONARY ANGIOGRAPHY;  Surgeon: Yolonda Kida, MD;  Location: [place] [facility];  Service: Cardiovascular;  Laterality: N/A;  ? ? ?Social History ?Social History  ? ?Tobacco Use  ? Smoking status: Former  ?  Types: Cigarettes  ? Smokeless tobacco: Never  ?Substance Use Topics  ? Alcohol use: Not Currently  ? Drug  use: Not Currently  ? ? ?Family History ?Family History  ?Problem Relation Age of Onset  ? Congestive Heart Failure Mother   ? Diabetes Father   ? Breast cancer Neg Hx   ? ? ?Allergies  ?Allergen Reactions  ? Tetanus Toxoids Swelling  ?  Tetanus and diphtheria toxids  ? Penicillin G Rash  ? ? ? ?REVIEW OF SYSTEMS (Negative unless checked) ? ?Constitutional: [] Weight loss  [] Fever  [] Chills ?Cardiac: [] Chest pain   [] Chest pressure   [] Palpitations   [] Shortness of breath when laying flat   [] Shortness of breath with exertion. ?Vascular:  [x] Pain in legs with walking   [] Pain in legs at rest  [] History of DVT   [] Phlebitis   [] Swelling in legs   [] Varicose veins   [] Non-healing ulcers ?Pulmonary:   [] Uses home oxygen   [] Productive cough   [] Hemoptysis   [] Wheeze  [] COPD   [] Asthma ?Neurologic:  [] Dizziness   [] Seizures   [] History of stroke   [] History of TIA  [] Aphasia   [] Vissual changes   [] Weakness or numbness in arm   [] Weakness or numbness in leg ?Musculoskeletal:   [] Joint swelling   [] Joint pain   [] Low back pain ?Hematologic:  [] Easy bruising  [] Easy bleeding   [] Hypercoagulable state   [] Anemic ?Gastrointestinal:  [] Diarrhea   [] Vomiting  [x] Gastroesophageal reflux/heartburn   [] Difficulty swallowing. ?Genitourinary:  [] Chronic kidney disease   [] Difficult urination  [] Frequent urination   [] Blood in urine ?  Skin:  [] Rashes   [] Ulcers  ?Psychological:  [] History of anxiety   []  History of major depression. ? ?Physical Examination ? ?There were no vitals filed for this visit. ?There is no height or weight on file to calculate BMI. ?Gen: WD/WN, NAD ?Head: [place]/AT, No temporalis wasting.  ?Ear/Nose/Throat: Hearing grossly intact, nares w/o erythema or drainage ?Eyes: PER, EOMI, sclera nonicteric.  ?Neck: Supple, no masses.  No bruit or JVD.  ?Pulmonary:  Good air movement, no audible wheezing, no use of accessory muscles.  ?Cardiac: RRR, normal S1, S2, no Murmurs. ?Vascular:  carotid bruit noted ?Vessel Right  Left  ?Radial Palpable Palpable  ?Carotid  Palpable  Palpable  ?Gastrointestinal: soft, non-distended. No guarding/no peritoneal signs.  ?Musculoskeletal: M/S 5/5 throughout.  No visible deformity.  ?Neurologic: CN 2-12 intact. Pain and light touch intact in extremities.  Symmetrical.  Speech is fluent. Motor exam as listed above. ?Psychiatric: Judgment intact, Mood & affect appropriate for pt's clinical situation. ?Dermatologic: No rashes or ulcers noted.  No changes consistent with cellulitis. ? ? ?CBC ?Lab Results  ?Component Value Date  ? WBC 5.7 10/03/2021  ? HGB 10.3 (L) 10/03/2021  ? HCT 31.1 (L) 10/03/2021  ? MCV 89.9 10/03/2021  ? PLT 226 10/03/2021  ? ? ?BMET ?   ?Component Value Date/Time  ? NA 135 10/03/2021 1118  ? K 3.7 10/03/2021 1118  ? CL 96 (L) 10/03/2021 1118  ? CO2 27 10/03/2021 1118  ? GLUCOSE 114 (H) 10/03/2021 1118  ? BUN 49 (H) 10/03/2021 1118  ? CREATININE 1.42 (H) 10/03/2021 1118  ? CALCIUM 9.1 10/03/2021 1118  ? GFRNONAA 38 (L) 10/03/2021 1118  ? ?CrCl cannot be calculated (Unknown ideal weight.). ? ?COAG ?Lab Results  ?Component Value Date  ? INR 1.0 06/01/2021  ? ? ?Radiology ?CT HEAD WO CONTRAST ? ?Result Date: 10/03/2021 ?CLINICAL DATA:  Numbness or tingling, paresthesia (Ped 0-17y) a EXAM: CT HEAD WITHOUT CONTRAST TECHNIQUE: Contiguous axial images were obtained from the base of the skull through the vertex without intravenous contrast. RADIATION DOSE REDUCTION: This exam was performed according to the departmental dose-optimization program which includes automated exposure control, adjustment of the mA and/or kV according to patient size and/or use of iterative reconstruction technique. COMPARISON:  None. FINDINGS: Brain: No evidence of acute large vascular territory infarction, hemorrhage, hydrocephalus, extra-axial collection or mass lesion/mass effect. Vascular: No hyperdense vessel identified. Calcific intracranial atherosclerosis. Skull: No acute fracture. Sinuses/Orbits: Clear  sinuses.  No acute orbital findings. Other: No mastoid effusions. IMPRESSION: No evidence of acute intracranial abnormality. Electronically Signed   By: Margaretha Sheffield M.D.   On: 10/03/2021 11:44  ? ?MR BRAIN WO CONTRAST ? ?Result Date: 10/03/2021 ?CLINICAL DATA:  Acute neuro deficit.  Right-sided numbness EXAM: MRI HEAD WITHOUT CONTRAST TECHNIQUE: Multiplanar, multiecho pulse sequences of the brain and surrounding structures were obtained without intravenous contrast. COMPARISON:  CT head 10/03/2021 FINDINGS: Brain: No acute infarction, hemorrhage, hydrocephalus, extra-axial collection or mass lesion. Cerebral volume normal for age. Mild atrophy. Mild white matter changes with scattered small white matter hyperintensities bilaterally. Mild hyperintensity in the pons bilaterally. Mild motion degrades some of the sequences. Vascular: Negative for hyperdense vessel Skull and upper cervical spine: No focal lesion Sinuses/Orbits: Mild mucosal edema paranasal sinuses. Bilateral cataract extraction Other: None IMPRESSION: No acute abnormality Mild chronic microvascular ischemic change in the white matter and pons. Electronically Signed   By: Franchot Gallo M.D.   On: 10/03/2021 14:59  ? ?MR Cervical Spine Wo Contrast ? ?  Result Date: 10/03/2021 ?CLINICAL DATA:  Ataxia, cervical pathology suspected. Right-sided numbness EXAM: MRI CERVICAL SPINE WITHOUT CONTRAST TECHNIQUE: Multiplanar, multisequence MR imaging of the cervical spine was performed. No intravenous contrast was administered. COMPARISON:  None. FINDINGS: Alignment: Mild retrolisthesis C4-5 and C5-6 Vertebrae: Normal bone marrow.  Negative for fracture or mass. Cord: Cord evaluation limited. Moderate motion degrades image quality. Posterior Fossa, vertebral arteries, paraspinal tissues: Negative Disc levels: C2-3: Mild right foraminal narrowing due to facet hypertrophy C3-4: Mild disc and facet degeneration and spurring. No significant stenosis C4-5: Disc  degeneration and diffuse uncinate spurring. Mild foraminal narrowing bilaterally. C5-6: Disc degeneration with diffuse uncinate spurring. Mild foraminal narrowing bilaterally C6-7: Disc degeneration and diffuse

## 2021-10-19 ENCOUNTER — Encounter (INDEPENDENT_AMBULATORY_CARE_PROVIDER_SITE_OTHER): Payer: Self-pay | Admitting: Vascular Surgery

## 2021-10-19 ENCOUNTER — Ambulatory Visit (INDEPENDENT_AMBULATORY_CARE_PROVIDER_SITE_OTHER): Payer: Medicare Other

## 2021-10-19 ENCOUNTER — Ambulatory Visit (INDEPENDENT_AMBULATORY_CARE_PROVIDER_SITE_OTHER): Payer: Medicare Other | Admitting: Vascular Surgery

## 2021-10-19 VITALS — BP 136/43 | HR 56 | Resp 17 | Ht 59.0 in | Wt 128.0 lb

## 2021-10-19 DIAGNOSIS — I10 Essential (primary) hypertension: Secondary | ICD-10-CM | POA: Diagnosis not present

## 2021-10-19 DIAGNOSIS — E785 Hyperlipidemia, unspecified: Secondary | ICD-10-CM

## 2021-10-19 DIAGNOSIS — I6523 Occlusion and stenosis of bilateral carotid arteries: Secondary | ICD-10-CM

## 2021-10-19 DIAGNOSIS — I25118 Atherosclerotic heart disease of native coronary artery with other forms of angina pectoris: Secondary | ICD-10-CM | POA: Diagnosis not present

## 2021-10-19 DIAGNOSIS — I70213 Atherosclerosis of native arteries of extremities with intermittent claudication, bilateral legs: Secondary | ICD-10-CM

## 2021-10-19 DIAGNOSIS — E1169 Type 2 diabetes mellitus with other specified complication: Secondary | ICD-10-CM

## 2021-10-31 ENCOUNTER — Ambulatory Visit
Admission: RE | Admit: 2021-10-31 | Discharge: 2021-10-31 | Disposition: A | Discharge status: Home / Self Care | Payer: Medicare Other | Source: Ambulatory Visit | Attending: Orthopedic Surgery | Admitting: Orthopedic Surgery

## 2021-10-31 DIAGNOSIS — M5136 Other intervertebral disc degeneration, lumbar region: Secondary | ICD-10-CM | POA: Insufficient documentation

## 2021-10-31 DIAGNOSIS — M51369 Other intervertebral disc degeneration, lumbar region without mention of lumbar back pain or lower extremity pain: Secondary | ICD-10-CM

## 2021-10-31 DIAGNOSIS — S32040A Wedge compression fracture of fourth lumbar vertebra, initial encounter for closed fracture: Secondary | ICD-10-CM

## 2021-10-31 DIAGNOSIS — M47816 Spondylosis without myelopathy or radiculopathy, lumbar region: Secondary | ICD-10-CM

## 2021-10-31 IMAGING — MR MR LUMBAR SPINE W/O CM
5 series · 31 of 48 positions shown · non-contrast
Comparison: None Available.

CLINICAL DATA: Low back pain with bilateral leg numbness for 1-2
months

EXAM:
MRI LUMBAR SPINE WITHOUT CONTRAST
TECHNIQUE: Multiplanar, multisequence MR imaging of the lumbar spine was
performed. No intravenous contrast was administered.

[Series 5: T2 · sagittal · 4.0mm · 0.81mm/px · 6 of 18 slices shown (1 of 2)]
[im 1/18]
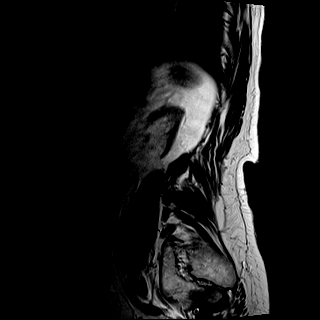
[im 4/18]
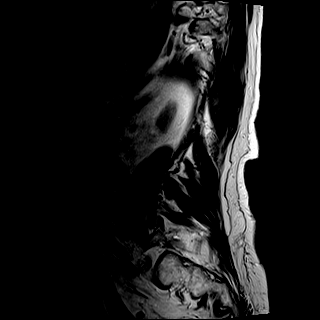
[im 7/18]
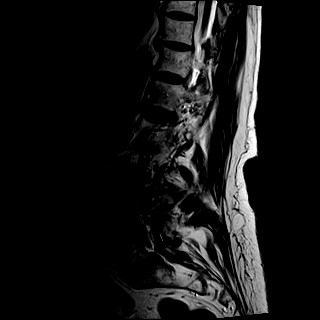
[im 11/18]
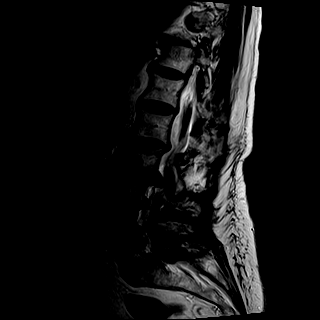
[im 14/18]
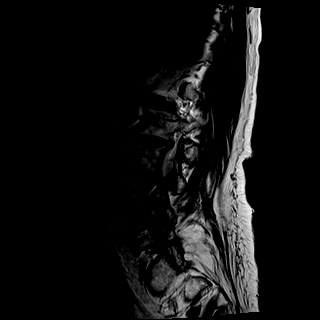
[im 18/18]
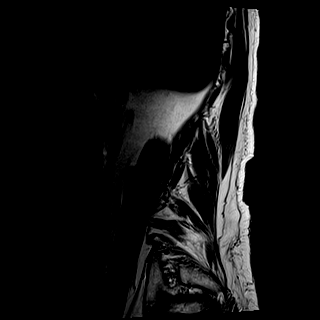

[Series 6: T1 · sagittal · 4.0mm · 0.81mm/px · 6 of 18 slices shown (1 of 2)]
[im 1/18]
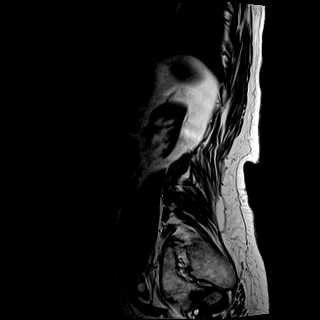
[im 4/18]
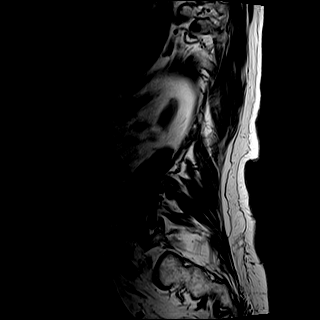
[im 7/18]
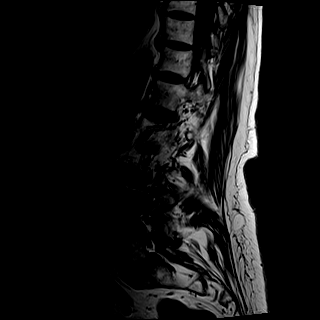
[im 11/18]
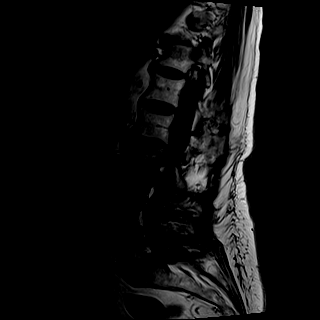
[im 14/18]
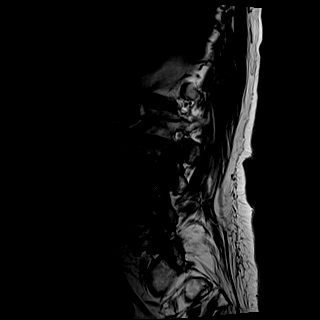
[im 18/18]
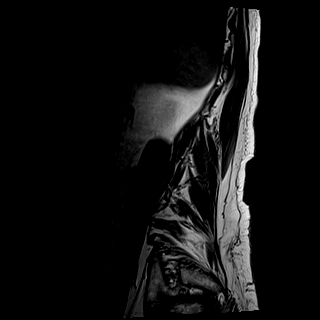

[Series 7: STIR · sagittal · 4.0mm · 0.41mm/px · 1 of 18 slices shown]
[im 1/18]
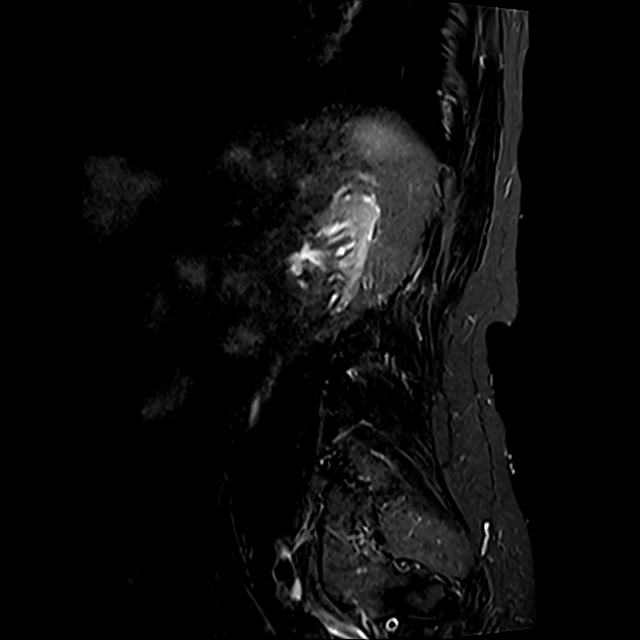

[Series 10: T2 · axial · 4.0mm · 0.78mm/px · z∈[-150,+103]mm · 9 of 42 slices shown (2 of 2)]
[im 1/42]
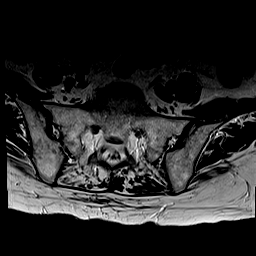
[im 6/42]
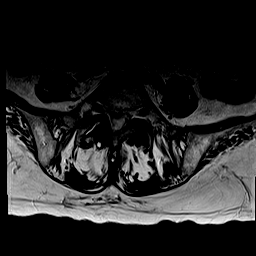
[im 12/42]
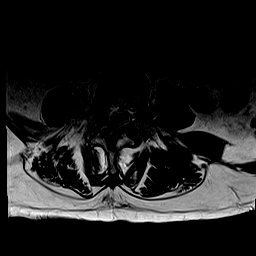
[im 18/42]
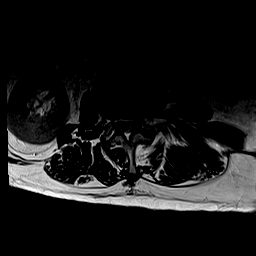
[im 21/42]
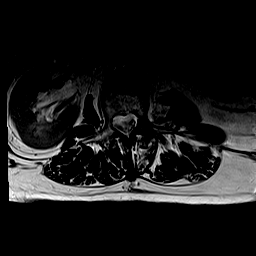
[im 24/42]
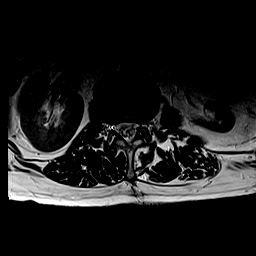
[im 30/42]
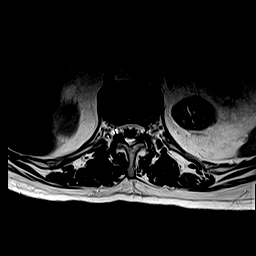
[im 36/42]
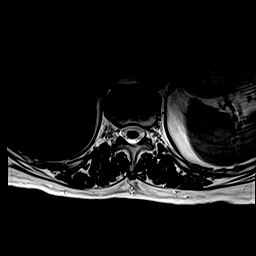
[im 42/42]
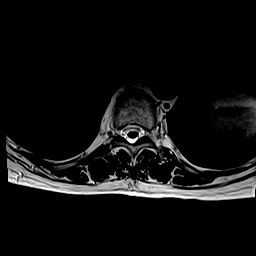

[Series 13: T1 · axial · 4.0mm · 0.39mm/px · z∈[-150,+103]mm · 9 of 42 slices shown (2 of 2)]
[im 1/42]
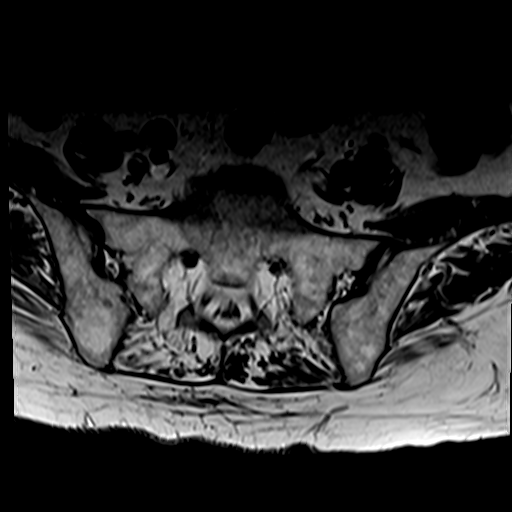
[im 6/42]
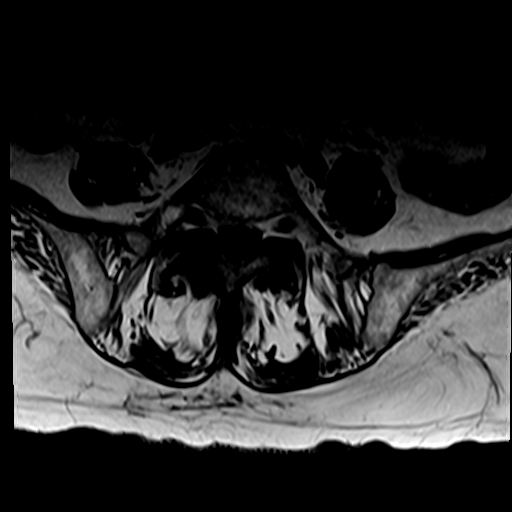
[im 12/42]
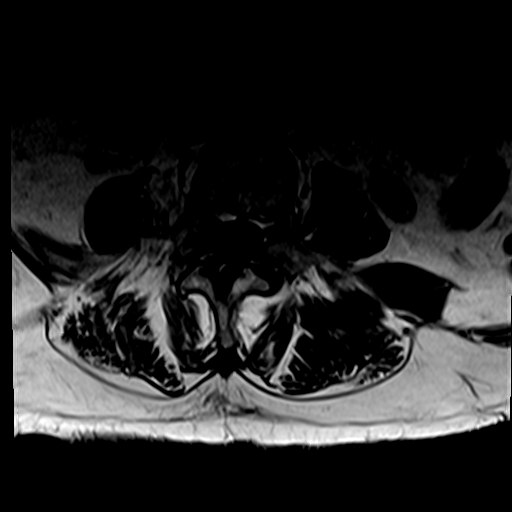
[im 18/42]
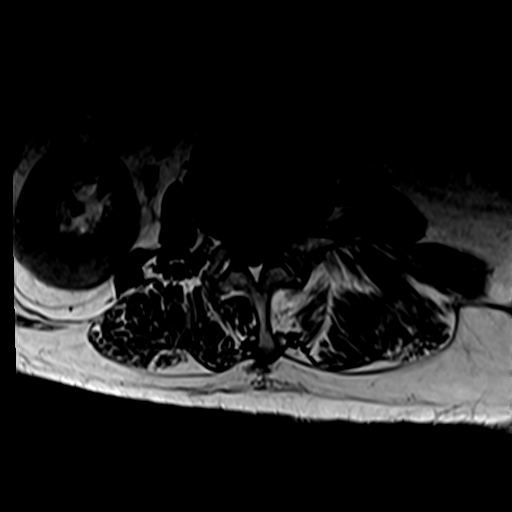
[im 21/42]
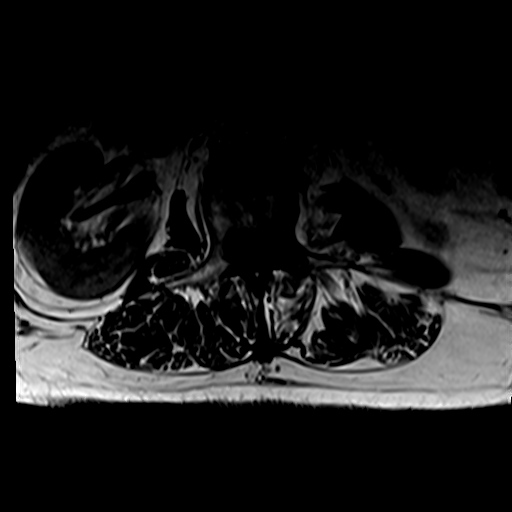
[im 24/42]
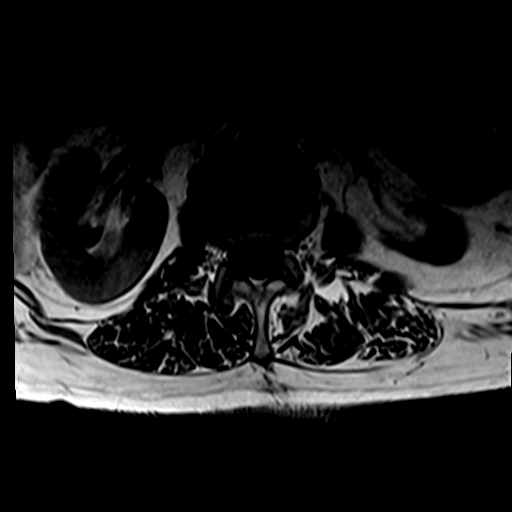
[im 30/42]
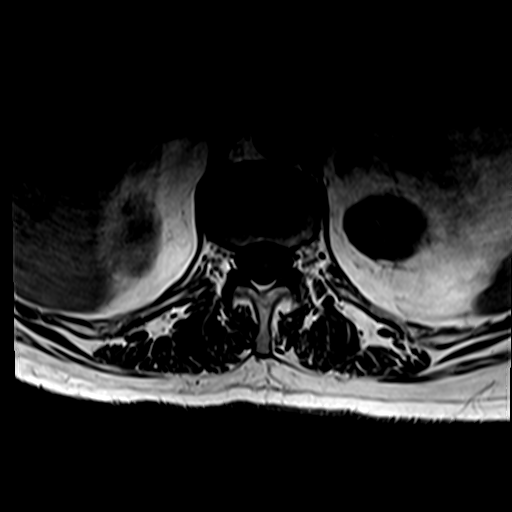
[im 36/42]
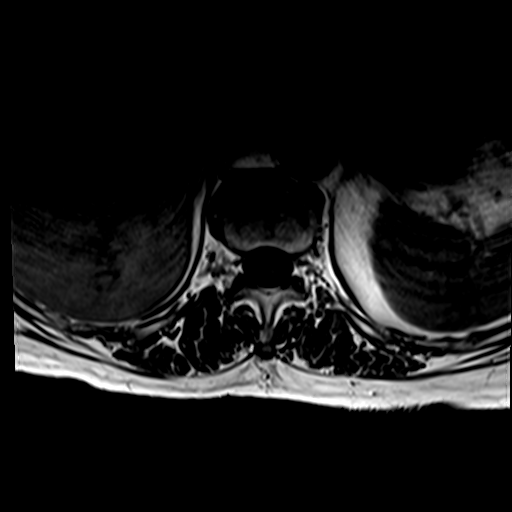
[im 42/42]
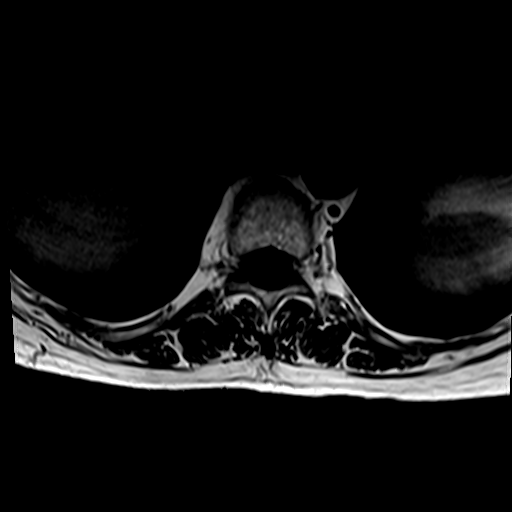

[31 of 48 positions shown; findings below may reference images not displayed]

FINDINGS: Segmentation: 5 lumbar type vertebrae based on the available
coverage

Alignment: Dextroscoliosis and L5-S1 anterolisthesis. Chronic pars
defect is seen on the right at L5-S1.

Vertebrae: Mild discogenic endplate edema at L2-3. No fracture or
aggressive bone lesion.

Conus medullaris and cauda equina: Conus extends to the L1-2 level.
Conus and cauda equina appear normal.

Paraspinal and other soft tissues: Cholelithiasis and left renal
atrophy.

Disc levels:

T12- L1: Unremarkable.

L1-L2: Disc narrowing and bulging.  Negative facets.

L2-L3: Disc collapse with bulging and endplate spurring eccentric to
the left. Asymmetric left facet spurring. Advanced spinal and left
foraminal stenosis

L3-L4: Disc collapse and endplate degeneration with endplate and
facet spurring. Advanced right and moderate left foraminal stenosis.
Advanced spinal stenosis

L4-L5: Advanced facet osteoarthritis with right more than left
spurring and mild anterolisthesis. Circumferential disc bulging with
noncompressive right foraminal stenosis. High-grade spinal stenosis
with cauda equina compression.

L5-S1:Advanced facet osteoarthritis with bilateral spurring. The
disc is narrowed and bulging. Mild bilateral subarticular recess
stenosis.
IMPRESSION: 1. Advanced lumbar spine degeneration with scoliosis and L5-S1
anterolisthesis. Chronic right L5 pars defect.
2. Compressive spinal stenosis at L2-3 to L4-5, worst at L2-3.
3. Right foraminal impingement at L3-4 and left foraminal
impingement at L2-3.
4. Cholelithiasis and left renal atrophy.

## 2021-12-06 ENCOUNTER — Emergency Department: Payer: Medicare Other

## 2021-12-06 ENCOUNTER — Other Ambulatory Visit: Payer: Self-pay

## 2021-12-06 ENCOUNTER — Inpatient Hospital Stay
Admission: EM | Admit: 2021-12-06 | Discharge: 2021-12-08 | DRG: 281 | Disposition: A | Discharge status: Home / Self Care | Payer: Medicare Other | Attending: Internal Medicine | Admitting: Internal Medicine

## 2021-12-06 DIAGNOSIS — I25110 Atherosclerotic heart disease of native coronary artery with unstable angina pectoris: Secondary | ICD-10-CM | POA: Diagnosis present

## 2021-12-06 DIAGNOSIS — R079 Chest pain, unspecified: Secondary | ICD-10-CM | POA: Diagnosis not present

## 2021-12-06 DIAGNOSIS — E1129 Type 2 diabetes mellitus with other diabetic kidney complication: Secondary | ICD-10-CM | POA: Diagnosis present

## 2021-12-06 DIAGNOSIS — I200 Unstable angina: Secondary | ICD-10-CM | POA: Diagnosis not present

## 2021-12-06 DIAGNOSIS — E119 Type 2 diabetes mellitus without complications: Secondary | ICD-10-CM | POA: Diagnosis present

## 2021-12-06 DIAGNOSIS — Z7982 Long term (current) use of aspirin: Secondary | ICD-10-CM

## 2021-12-06 DIAGNOSIS — Z880 Allergy status to penicillin: Secondary | ICD-10-CM

## 2021-12-06 DIAGNOSIS — K219 Gastro-esophageal reflux disease without esophagitis: Secondary | ICD-10-CM | POA: Diagnosis present

## 2021-12-06 DIAGNOSIS — Z87891 Personal history of nicotine dependence: Secondary | ICD-10-CM

## 2021-12-06 DIAGNOSIS — I255 Ischemic cardiomyopathy: Secondary | ICD-10-CM | POA: Diagnosis present

## 2021-12-06 DIAGNOSIS — I10 Essential (primary) hypertension: Secondary | ICD-10-CM | POA: Diagnosis present

## 2021-12-06 DIAGNOSIS — E1151 Type 2 diabetes mellitus with diabetic peripheral angiopathy without gangrene: Secondary | ICD-10-CM | POA: Diagnosis present

## 2021-12-06 DIAGNOSIS — Z833 Family history of diabetes mellitus: Secondary | ICD-10-CM

## 2021-12-06 DIAGNOSIS — Z951 Presence of aortocoronary bypass graft: Secondary | ICD-10-CM

## 2021-12-06 DIAGNOSIS — Z794 Long term (current) use of insulin: Secondary | ICD-10-CM

## 2021-12-06 DIAGNOSIS — I130 Hypertensive heart and chronic kidney disease with heart failure and stage 1 through stage 4 chronic kidney disease, or unspecified chronic kidney disease: Secondary | ICD-10-CM | POA: Diagnosis present

## 2021-12-06 DIAGNOSIS — I214 Non-ST elevation (NSTEMI) myocardial infarction: Principal | ICD-10-CM | POA: Diagnosis present

## 2021-12-06 DIAGNOSIS — I252 Old myocardial infarction: Secondary | ICD-10-CM

## 2021-12-06 DIAGNOSIS — N1831 Chronic kidney disease, stage 3a: Secondary | ICD-10-CM | POA: Diagnosis present

## 2021-12-06 DIAGNOSIS — R778 Other specified abnormalities of plasma proteins: Secondary | ICD-10-CM

## 2021-12-06 DIAGNOSIS — Z79899 Other long term (current) drug therapy: Secondary | ICD-10-CM

## 2021-12-06 DIAGNOSIS — R911 Solitary pulmonary nodule: Secondary | ICD-10-CM | POA: Diagnosis present

## 2021-12-06 DIAGNOSIS — Z888 Allergy status to other drugs, medicaments and biological substances status: Secondary | ICD-10-CM

## 2021-12-06 DIAGNOSIS — Z8249 Family history of ischemic heart disease and other diseases of the circulatory system: Secondary | ICD-10-CM

## 2021-12-06 DIAGNOSIS — E1122 Type 2 diabetes mellitus with diabetic chronic kidney disease: Secondary | ICD-10-CM | POA: Diagnosis present

## 2021-12-06 DIAGNOSIS — G2581 Restless legs syndrome: Secondary | ICD-10-CM | POA: Diagnosis present

## 2021-12-06 DIAGNOSIS — I5042 Chronic combined systolic (congestive) and diastolic (congestive) heart failure: Secondary | ICD-10-CM | POA: Diagnosis present

## 2021-12-06 DIAGNOSIS — E785 Hyperlipidemia, unspecified: Secondary | ICD-10-CM | POA: Diagnosis present

## 2021-12-06 DIAGNOSIS — I2510 Atherosclerotic heart disease of native coronary artery without angina pectoris: Secondary | ICD-10-CM | POA: Diagnosis present

## 2021-12-06 DIAGNOSIS — Z7902 Long term (current) use of antithrombotics/antiplatelets: Secondary | ICD-10-CM

## 2021-12-06 HISTORY — DX: Chest pain, unspecified: R07.9

## 2021-12-06 LAB — PROTIME-INR
INR: 1 (ref 0.8–1.2)
Prothrombin Time: 13.1 seconds (ref 11.4–15.2)

## 2021-12-06 LAB — TROPONIN I (HIGH SENSITIVITY)
Troponin I (High Sensitivity): 55 ng/L — ABNORMAL HIGH (ref <18)
Troponin I (High Sensitivity): 108 ng/L (ref <18)
Troponin I (High Sensitivity): 788 ng/L (ref <18)

## 2021-12-06 LAB — HEMOGLOBIN A1C
Hgb A1c MFr Bld: 6.8 % — ABNORMAL HIGH (ref 4.8–5.6)
Mean Plasma Glucose: 148.46 mg/dL

## 2021-12-06 LAB — CBC WITH DIFFERENTIAL/PLATELET
Abs Immature Granulocytes: 0.03 10*3/uL (ref 0.00–0.07)
Basophils Absolute: 0 10*3/uL (ref 0.0–0.1)
Basophils Relative: 1 %
Eosinophils Absolute: 0.2 10*3/uL (ref 0.0–0.5)
Eosinophils Relative: 2 %
HCT: 32.6 % — ABNORMAL LOW (ref 36.0–46.0)
Hemoglobin: 10.4 g/dL — ABNORMAL LOW (ref 12.0–15.0)
Immature Granulocytes: 0 %
Lymphocytes Relative: 20 %
Lymphs Abs: 1.7 10*3/uL (ref 0.7–4.0)
MCH: 30.8 pg (ref 26.0–34.0)
MCHC: 31.9 g/dL (ref 30.0–36.0)
MCV: 96.4 fL (ref 80.0–100.0)
Monocytes Absolute: 0.7 10*3/uL (ref 0.1–1.0)
Monocytes Relative: 8 %
Neutro Abs: 5.9 10*3/uL (ref 1.7–7.7)
Neutrophils Relative %: 69 %
Platelets: 237 10*3/uL (ref 150–400)
RBC: 3.38 MIL/uL — ABNORMAL LOW (ref 3.87–5.11)
RDW: 14.6 % (ref 11.5–15.5)
WBC: 8.6 10*3/uL (ref 4.0–10.5)
nRBC: 0 % (ref 0.0–0.2)

## 2021-12-06 LAB — BASIC METABOLIC PANEL
Anion gap: 8 (ref 5–15)
BUN: 37 mg/dL — ABNORMAL HIGH (ref 8–23)
CO2: 24 mmol/L (ref 22–32)
Calcium: 8.7 mg/dL — ABNORMAL LOW (ref 8.9–10.3)
Chloride: 103 mmol/L (ref 98–111)
Creatinine, Ser: 1.46 mg/dL — ABNORMAL HIGH (ref 0.44–1.00)
GFR, Estimated: 36 mL/min — ABNORMAL LOW (ref >60)
Glucose, Bld: 185 mg/dL — ABNORMAL HIGH (ref 70–99)
Potassium: 4.5 mmol/L (ref 3.5–5.1)
Sodium: 135 mmol/L (ref 135–145)

## 2021-12-06 LAB — CBG MONITORING, ED
Glucose-Capillary: 76 mg/dL (ref 70–99)
Glucose-Capillary: 92 mg/dL (ref 70–99)
Glucose-Capillary: 119 mg/dL — ABNORMAL HIGH (ref 70–99)

## 2021-12-06 LAB — APTT: aPTT: 22 seconds — ABNORMAL LOW (ref 24–36)

## 2021-12-06 LAB — BRAIN NATRIURETIC PEPTIDE: B Natriuretic Peptide: 384 pg/mL — ABNORMAL HIGH (ref 0.0–100.0)

## 2021-12-06 IMAGING — CT CT ANGIO CHEST
2 of 7 series · 18 of 46 positions shown · IV contrast (APPLIED)
Comparison: None Available.

CLINICAL DATA: Pulmonary embolism (PE) suspected, high prob

EXAM:
CT ANGIOGRAPHY CHEST WITH CONTRAST
TECHNIQUE: Multidetector CT imaging of the chest was performed using the
standard protocol during bolus administration of intravenous
contrast. Multiplanar CT image reconstructions and MIPs were
obtained to evaluate the vascular anatomy.

[Series 6: thins · axial · 0.71mm/px · z∈[+1463,+1653]mm · 15 of 306 slices shown]
[im 17/306  lung]
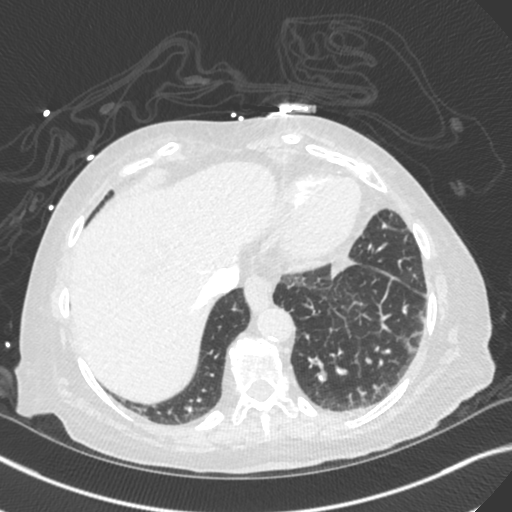
[im 34/306  soft-tissue]
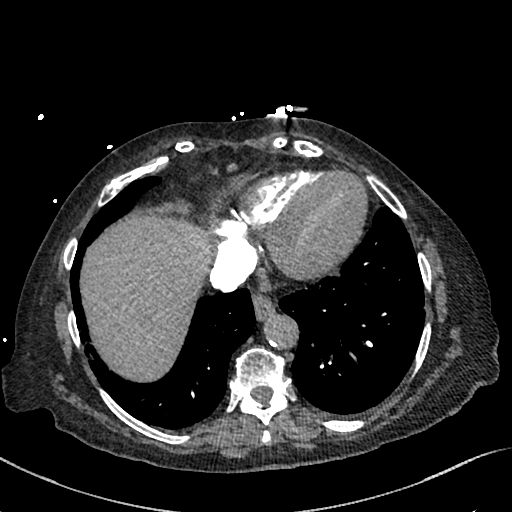
[im 51/306  lung]
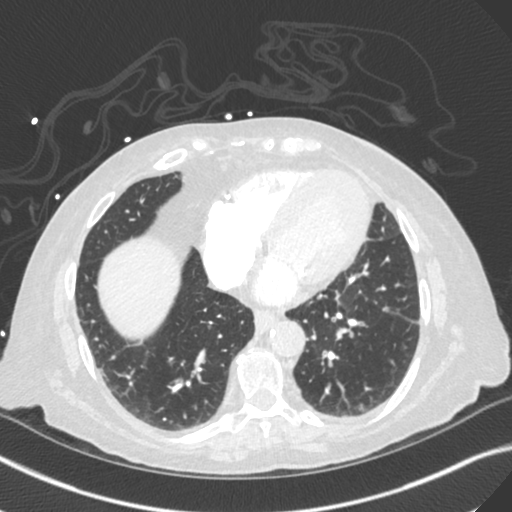
[im 68/306  soft-tissue]
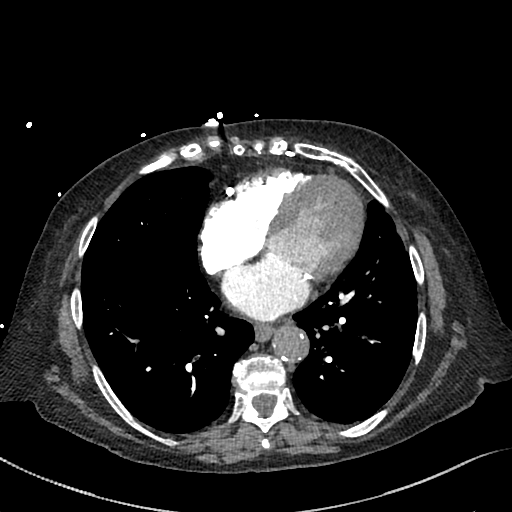
[im 102/306  lung]
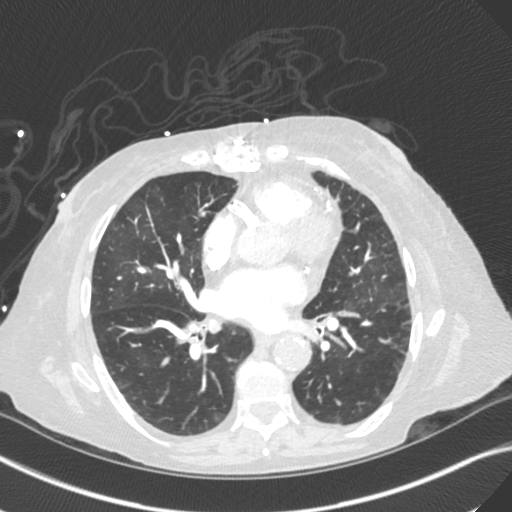
[im 119/306  soft-tissue]
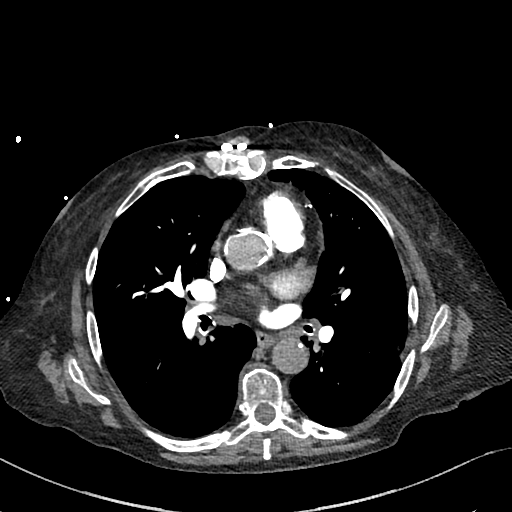
[im 136/306  lung]
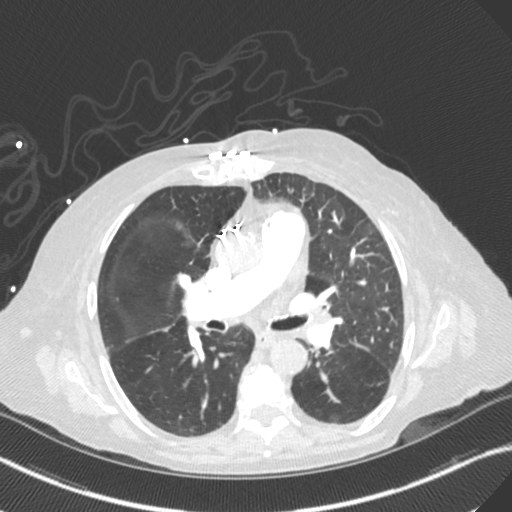
[im 153/306  soft-tissue]
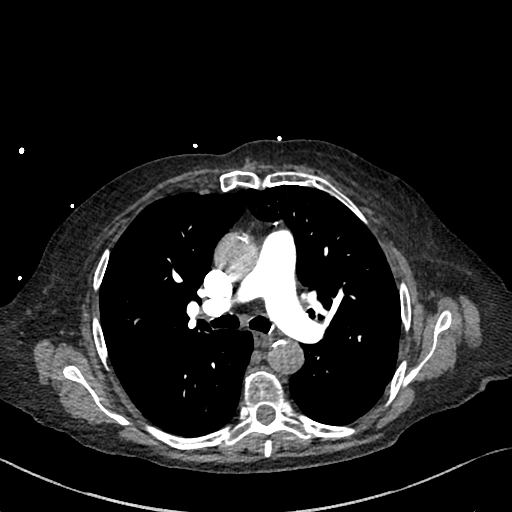
[im 170/306  lung]
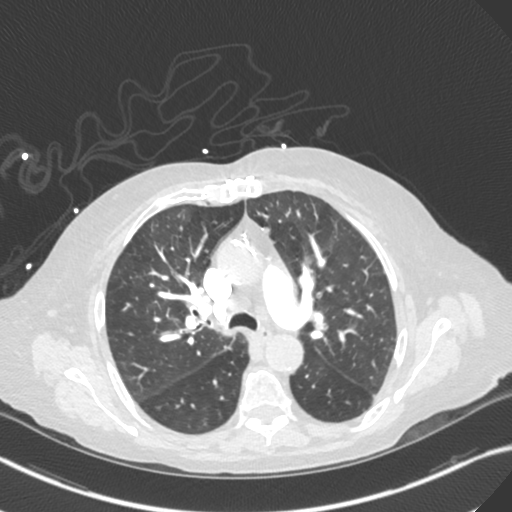
[im 187/306  soft-tissue]
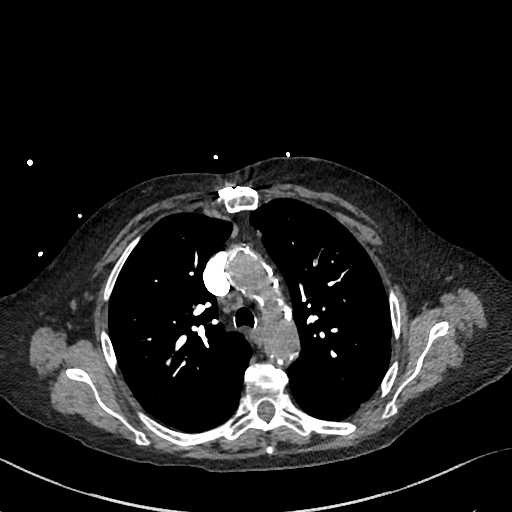
[im 204/306  lung]
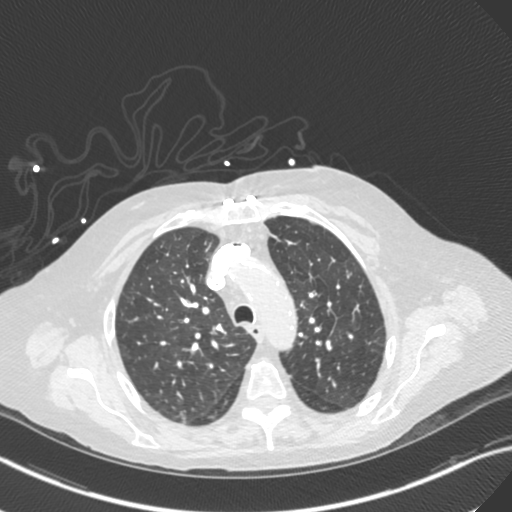
[im 238/306  soft-tissue]
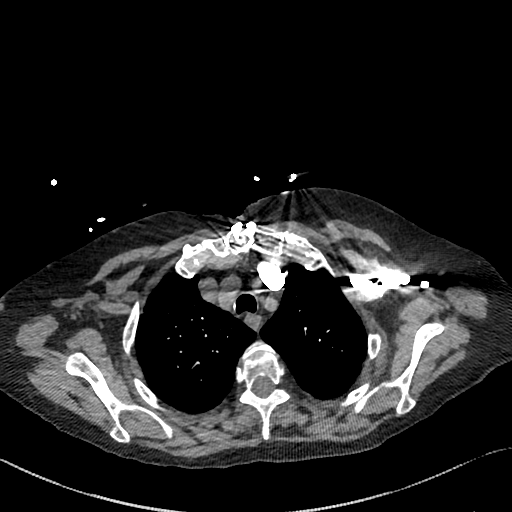
[im 255/306  lung]
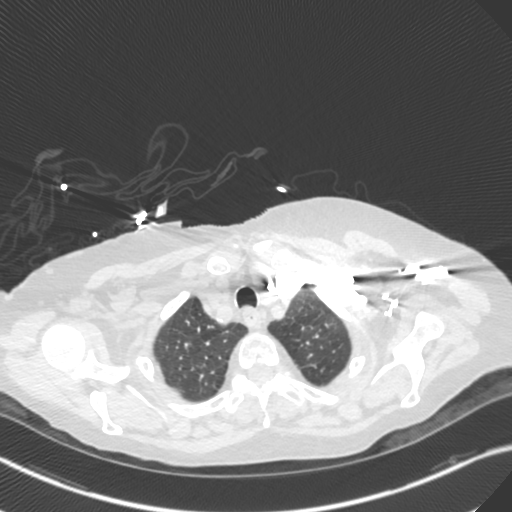
[im 272/306  soft-tissue]
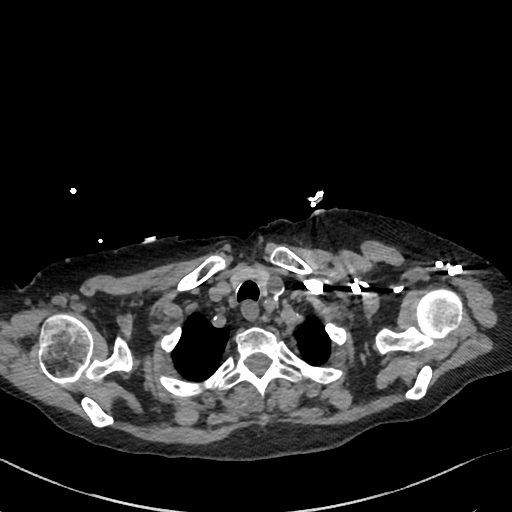
[im 289/306  lung]
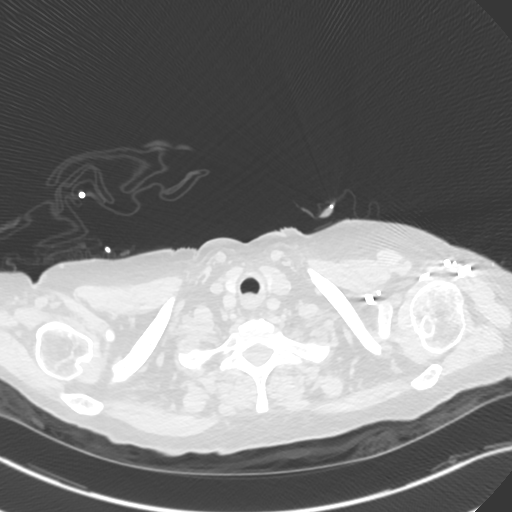

[Series 7: cor · coronal · 0.45mm/px · 3 of 129 slices shown]
[im 33/129  soft-tissue]
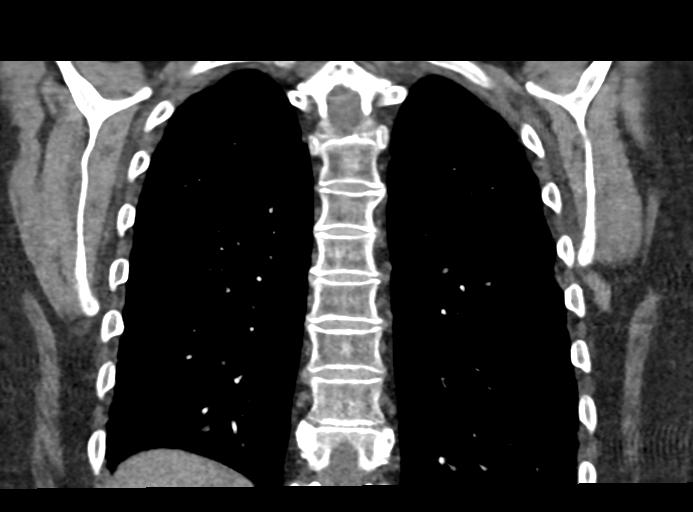
[im 65/129  soft-tissue]
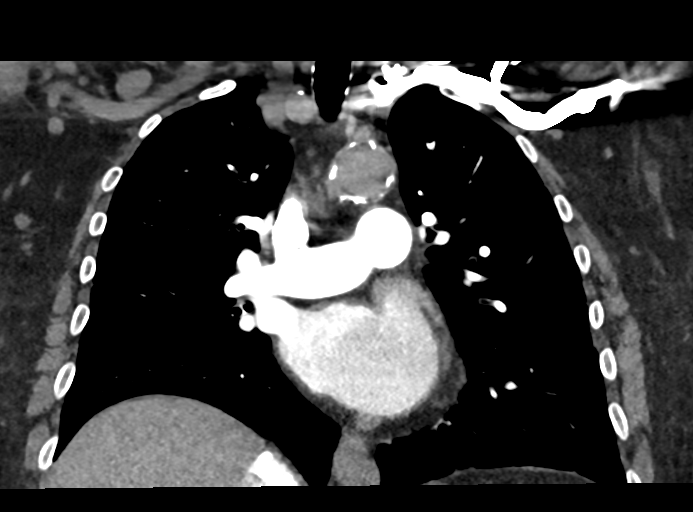
[im 97/129  soft-tissue]
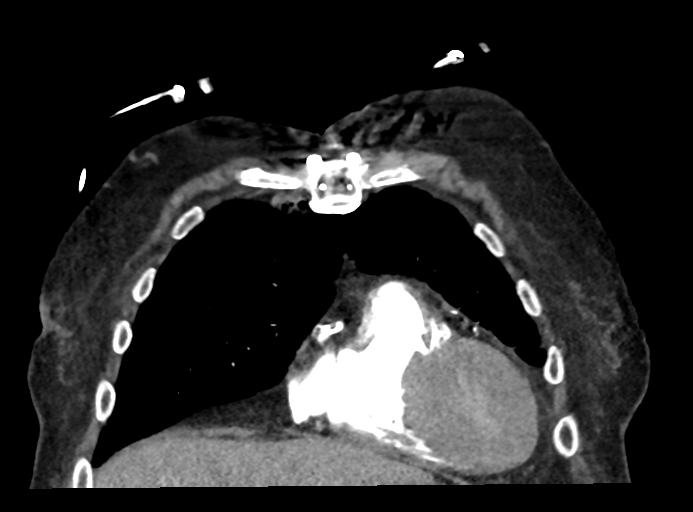

[18 of 46 positions shown; findings below may reference images not displayed]

RADIATION DOSE REDUCTION: This exam was performed according to the
departmental dose-optimization program which includes automated
exposure control, adjustment of the mA and/or kV according to
patient size and/or use of iterative reconstruction technique.

CONTRAST:  75mL OMNIPAQUE IOHEXOL 350 MG/ML SOLN
FINDINGS: Cardiovascular: Satisfactory opacification of the pulmonary arteries
to the segmental level. No evidence of pulmonary embolism. Mild
cardiomegaly. No pericardial disease. Prior CABG. Extensive native
coronary artery calcification. Reflux of contrast into the IVC and
hepatic veins. Moderate atherosclerosis of the thoracic aorta.

Mediastinum/Nodes: Mildly enlarged precarinal lymph node measuring
1.1 cm, likely reactive. No axillary adenopathy. No hilar
adenopathy. The thyroid is unremarkable. Small hiatal hernia.

Lungs/Pleura: There is interlobular septal thickening and
ground-glass opacities bilaterally. No airspace consolidation.

There is a 4 mm pulmonary nodule in the right lung base (series 5,
image 94).

Upper Abdomen: No acute abnormality.

Musculoskeletal: No chest wall abnormality. No acute or significant
osseous findings.

Review of the MIP images confirms the above findings.
IMPRESSION: No evidence of pulmonary embolism.  Mild pulmonary edema.

4 mm pulmonary nodule in the right lung base (series 5, image 94).
No routine follow-up imaging is recommended per Fleischner
guidelines. These guidelines do not apply to immunocompromised
patients and patients with cancer. Follow up in patients with
significant comorbidities as clinically warranted. Reference:

## 2021-12-06 IMAGING — CR DG CHEST 2V
1 series · 2 of 2 positions shown · non-contrast
Comparison: Chest x-ray [DATE].

CLINICAL DATA: 81-year-old female with history of chest pain.

EXAM:
CHEST - 2 VIEW

[Series 1: dg chest 2 view · 0.14mm/px · 2 of 2 slices shown]
[im 1/2]
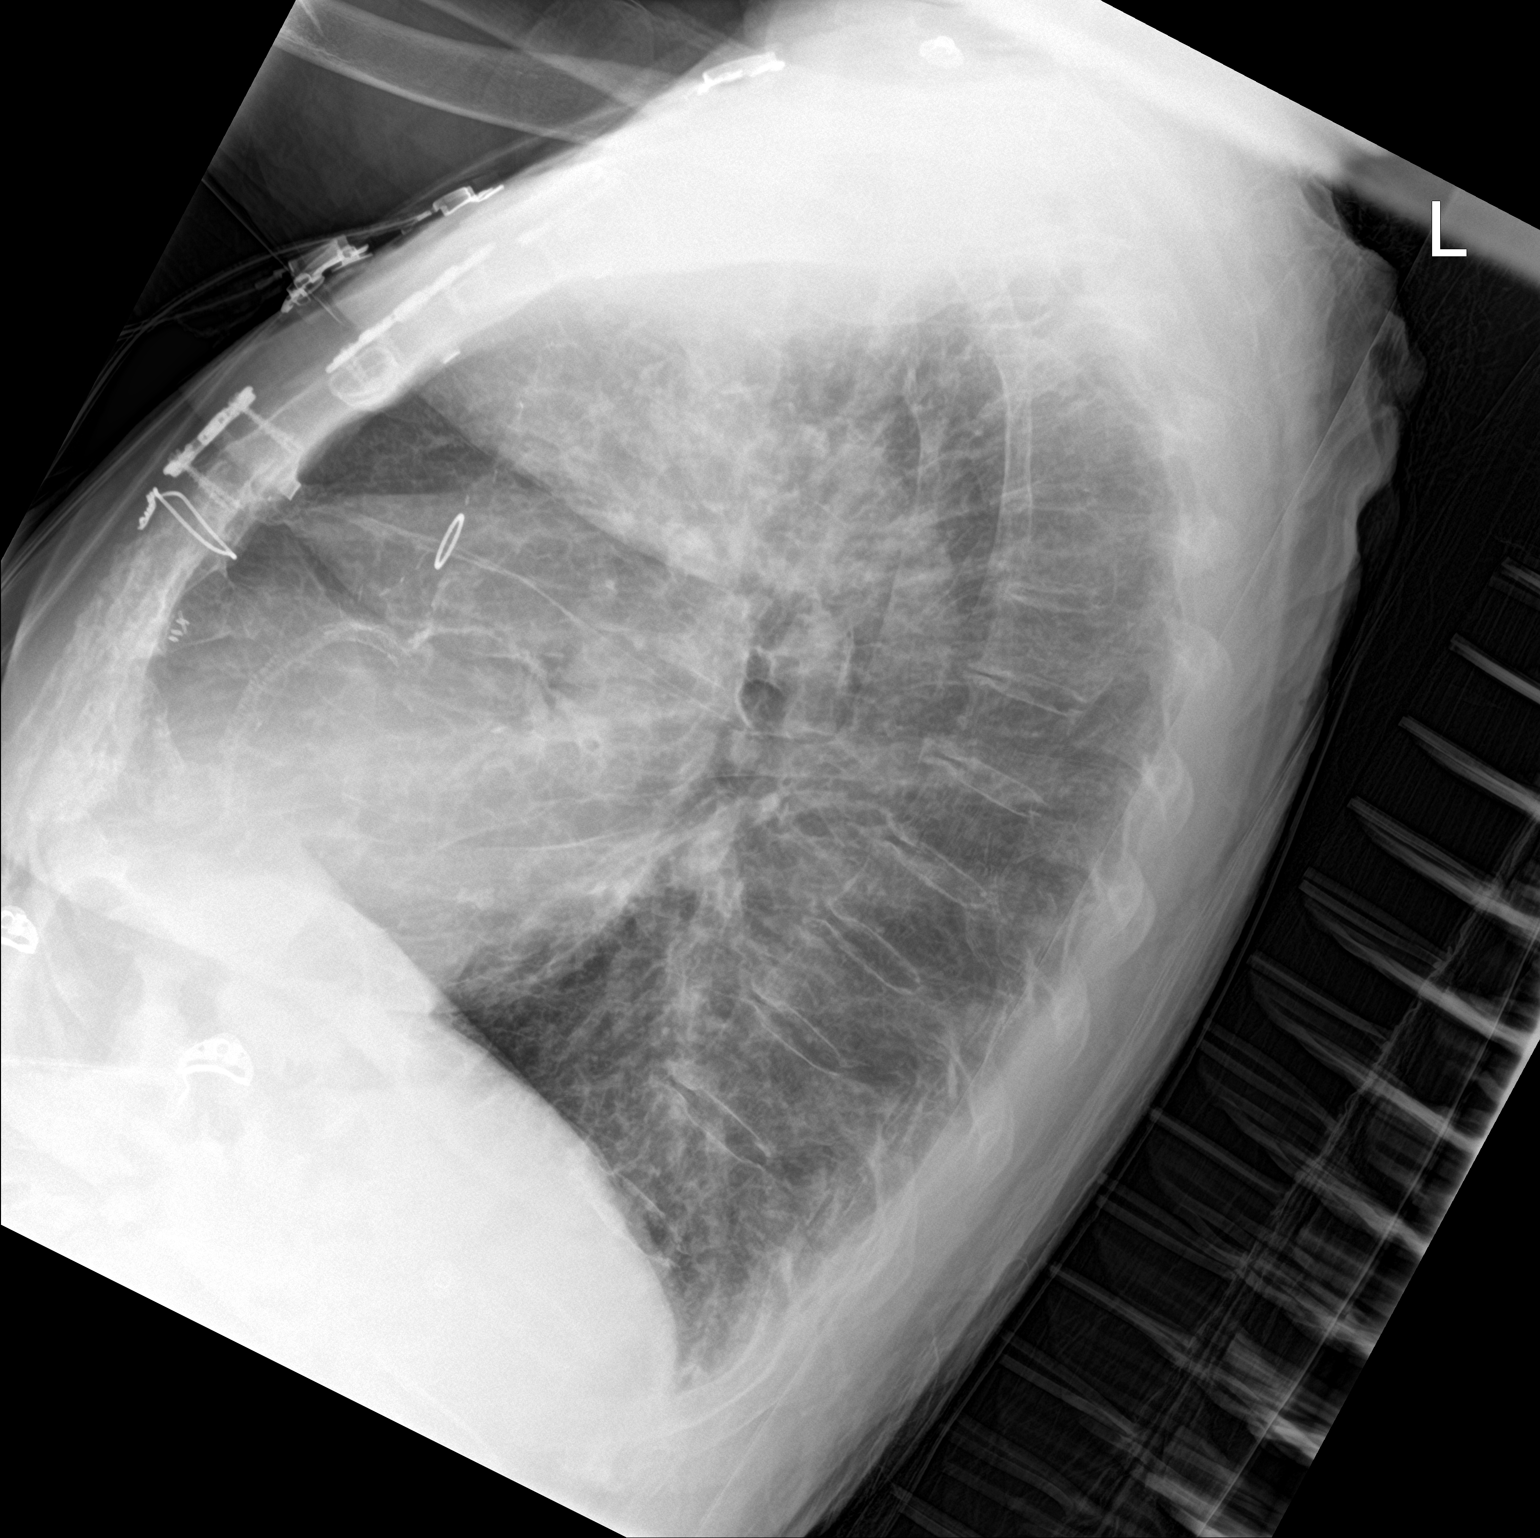
[im 2/2]
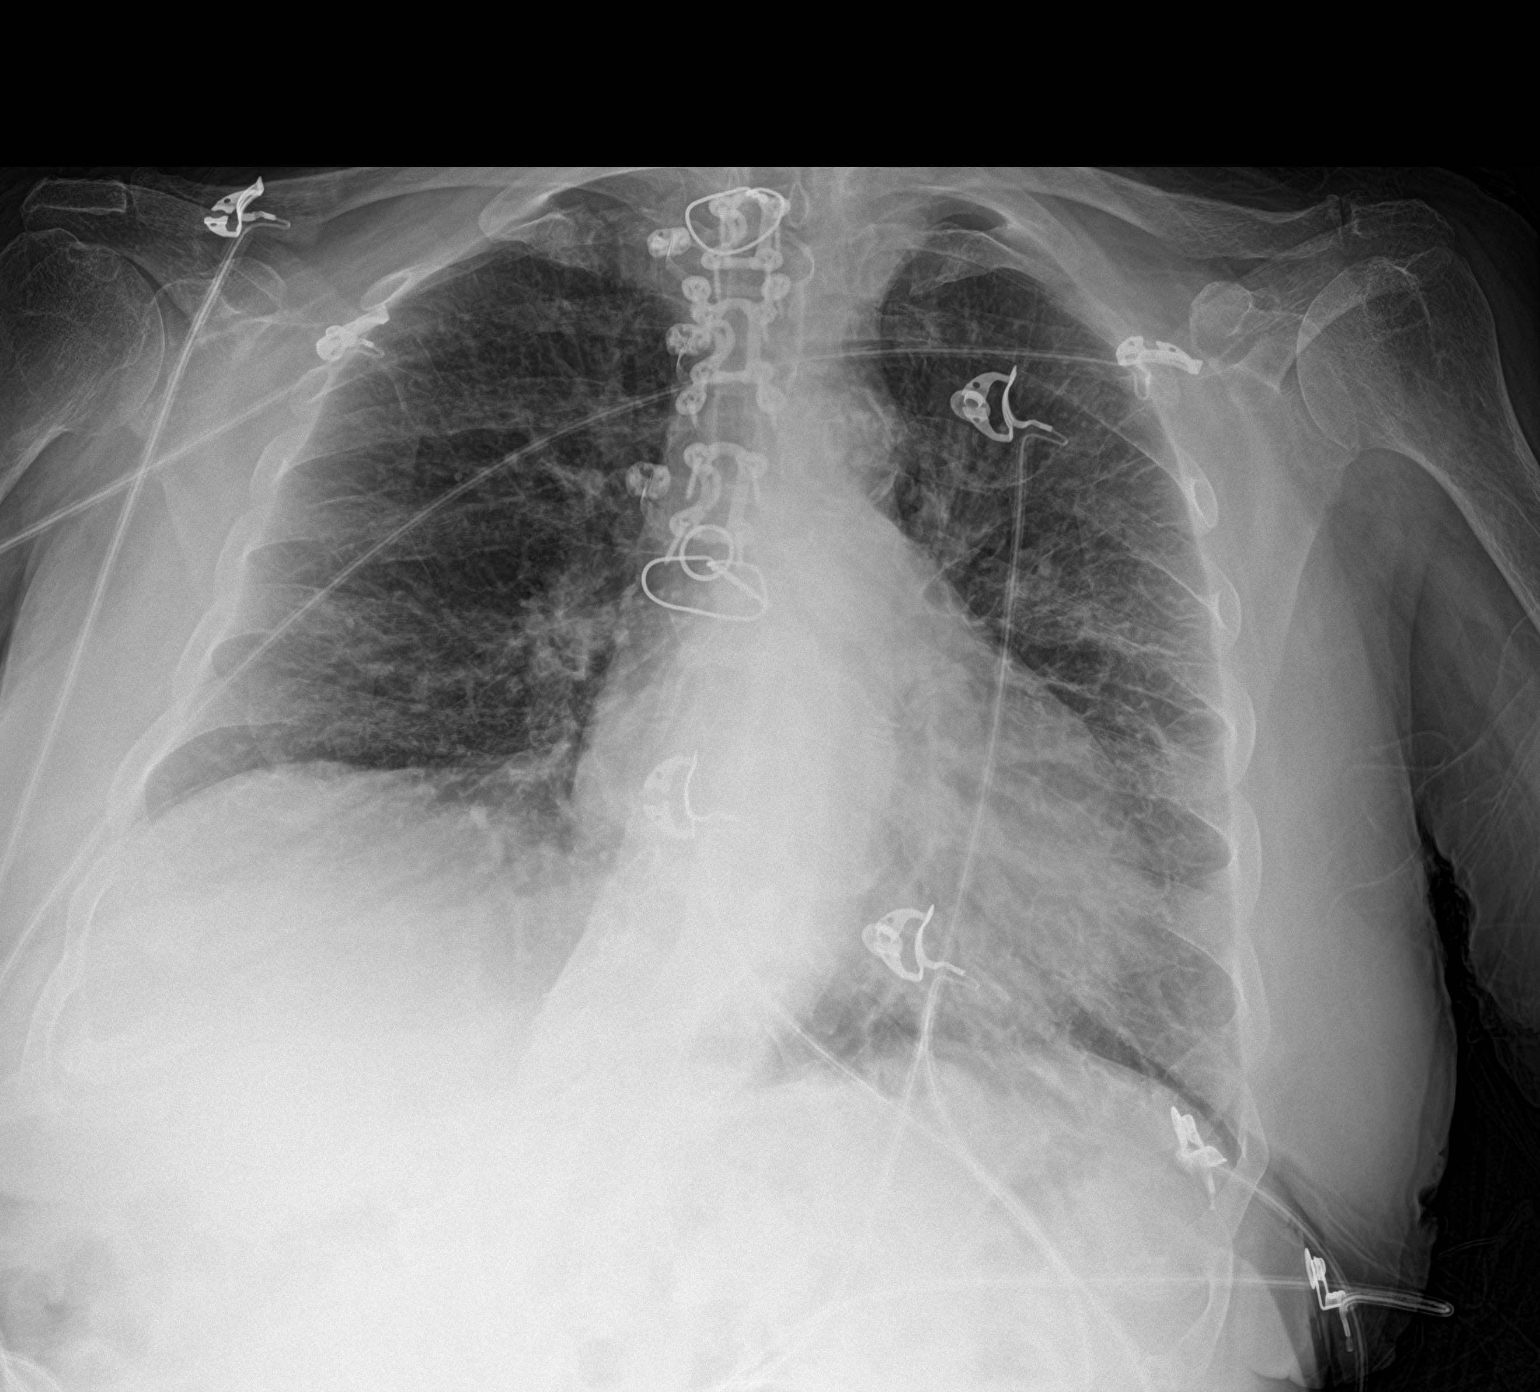

[2 of 2 positions shown; findings below may reference images not displayed]

FINDINGS: There is cephalization of the pulmonary vasculature and slight
indistinctness of the interstitial markings suggestive of mild
pulmonary edema. No pleural effusions. Mild cardiomegaly. Upper
mediastinal contours are within normal limits. Atherosclerotic
calcifications are noted in the thoracic aorta. Status post median
sternotomy.
IMPRESSION: 1. The appearance the chest suggests mild congestive heart failure,
as above.
2. Aortic atherosclerosis.

## 2021-12-06 MED ORDER — INSULIN GLARGINE-YFGN 100 UNIT/ML ~~LOC~~ SOLN
6.0000 [IU] | Freq: Every day | SUBCUTANEOUS | Status: DC
  Administered 2021-12-06 – 2021-12-07 (×2): 6 [IU] via SUBCUTANEOUS
  Filled 2021-12-06 (×3): qty 0.06

## 2021-12-06 MED ORDER — IOHEXOL 350 MG/ML SOLN
60.0000 mL | Freq: Once | INTRAVENOUS | Status: AC | PRN
  Administered 2021-12-06: 75 mL via INTRAVENOUS

## 2021-12-06 MED ORDER — ADULT MULTIVITAMIN W/MINERALS CH
1.0000 | ORAL_TABLET | Freq: Every day | ORAL | Status: DC
  Administered 2021-12-07 – 2021-12-08 (×2): 1 via ORAL
  Filled 2021-12-06 (×2): qty 1

## 2021-12-06 MED ORDER — LISINOPRIL-HYDROCHLOROTHIAZIDE 20-12.5 MG PO TABS: 1.0000 | ORAL_TABLET | Freq: Every day | ORAL | Status: DC

## 2021-12-06 MED ORDER — ISOSORBIDE MONONITRATE ER 30 MG PO TB24
30.0000 mg | ORAL_TABLET | Freq: Every day | ORAL | Status: DC
Start: 2021-12-06 — End: 2021-12-08
  Administered 2021-12-06 – 2021-12-08 (×2): 30 mg via ORAL
  Filled 2021-12-06 (×3): qty 1

## 2021-12-06 MED ORDER — HYDROCHLOROTHIAZIDE 12.5 MG PO TABS
12.5000 mg | ORAL_TABLET | Freq: Every day | ORAL | Status: DC
  Administered 2021-12-06: 12.5 mg via ORAL
  Filled 2021-12-06: qty 1

## 2021-12-06 MED ORDER — ONDANSETRON HCL 4 MG/2ML IJ SOLN: 4.0000 mg | Freq: Three times a day (TID) | INTRAMUSCULAR | Status: DC | PRN

## 2021-12-06 MED ORDER — TIOTROPIUM BROMIDE MONOHYDRATE 18 MCG IN CAPS
1.0000 | ORAL_CAPSULE | Freq: Every day | RESPIRATORY_TRACT | Status: DC
  Filled 2021-12-06: qty 5

## 2021-12-06 MED ORDER — ACETAMINOPHEN 325 MG PO TABS: 650.0000 mg | ORAL_TABLET | Freq: Four times a day (QID) | ORAL | Status: DC | PRN

## 2021-12-06 MED ORDER — METOPROLOL SUCCINATE ER 50 MG PO TB24
50.0000 mg | ORAL_TABLET | Freq: Every day | ORAL | Status: DC
  Administered 2021-12-06 – 2021-12-08 (×2): 50 mg via ORAL
  Filled 2021-12-06 (×2): qty 1

## 2021-12-06 MED ORDER — NYSTATIN 100000 UNIT/GM EX POWD
1.0000 | Freq: Three times a day (TID) | CUTANEOUS | Status: DC | PRN
Start: 2021-12-06 — End: 2021-12-08

## 2021-12-06 MED ORDER — BACLOFEN 10 MG PO TABS
5.0000 mg | ORAL_TABLET | Freq: Three times a day (TID) | ORAL | Status: DC
  Administered 2021-12-06 – 2021-12-08 (×5): 5 mg via ORAL
  Filled 2021-12-06 (×6): qty 0.5

## 2021-12-06 MED ORDER — INSULIN ASPART 100 UNIT/ML IJ SOLN: 0.0000 [IU] | Freq: Every day | INTRAMUSCULAR | Status: DC

## 2021-12-06 MED ORDER — SPIRONOLACTONE 25 MG PO TABS
12.5000 mg | ORAL_TABLET | Freq: Every day | ORAL | Status: DC
  Administered 2021-12-06 – 2021-12-08 (×2): 12.5 mg via ORAL
  Filled 2021-12-06: qty 1
  Filled 2021-12-06 (×2): qty 0.5
  Filled 2021-12-06: qty 1
  Filled 2021-12-06: qty 0.5

## 2021-12-06 MED ORDER — NITROGLYCERIN 0.4 MG SL SUBL
0.4000 mg | SUBLINGUAL_TABLET | SUBLINGUAL | Status: DC | PRN
  Administered 2021-12-08 (×2): 0.4 mg via SUBLINGUAL
  Filled 2021-12-06: qty 1

## 2021-12-06 MED ORDER — SENNA 8.6 MG PO TABS
2.0000 | ORAL_TABLET | Freq: Every day | ORAL | Status: DC
  Administered 2021-12-06 – 2021-12-07 (×2): 17.2 mg via ORAL
  Filled 2021-12-06 (×2): qty 2

## 2021-12-06 MED ORDER — VITAMIN D 25 MCG (1000 UNIT) PO TABS
1000.0000 [IU] | ORAL_TABLET | Freq: Two times a day (BID) | ORAL | Status: DC
  Administered 2021-12-06 – 2021-12-08 (×4): 1000 [IU] via ORAL
  Filled 2021-12-06 (×4): qty 1

## 2021-12-06 MED ORDER — TORSEMIDE 20 MG PO TABS
20.0000 mg | ORAL_TABLET | Freq: Every day | ORAL | Status: DC
  Administered 2021-12-06: 20 mg via ORAL
  Filled 2021-12-06: qty 1

## 2021-12-06 MED ORDER — ATORVASTATIN CALCIUM 80 MG PO TABS
80.0000 mg | ORAL_TABLET | Freq: Every evening | ORAL | Status: DC
  Administered 2021-12-06 – 2021-12-07 (×2): 80 mg via ORAL
  Filled 2021-12-06: qty 1
  Filled 2021-12-06: qty 4

## 2021-12-06 MED ORDER — MORPHINE SULFATE (PF) 2 MG/ML IV SOLN: 1.0000 mg | INTRAVENOUS | Status: DC | PRN

## 2021-12-06 MED ORDER — HYDRALAZINE HCL 20 MG/ML IJ SOLN: 5.0000 mg | INTRAMUSCULAR | Status: DC | PRN

## 2021-12-06 MED ORDER — LISINOPRIL 20 MG PO TABS
20.0000 mg | ORAL_TABLET | Freq: Every day | ORAL | Status: DC
  Administered 2021-12-06 – 2021-12-08 (×2): 20 mg via ORAL
  Filled 2021-12-06: qty 2
  Filled 2021-12-06: qty 1

## 2021-12-06 MED ORDER — MORPHINE SULFATE (PF) 4 MG/ML IV SOLN
4.0000 mg | Freq: Once | INTRAVENOUS | Status: AC
  Administered 2021-12-06: 4 mg via INTRAVENOUS
  Filled 2021-12-06: qty 1

## 2021-12-06 MED ORDER — INSULIN ASPART 100 UNIT/ML IJ SOLN
0.0000 [IU] | Freq: Three times a day (TID) | INTRAMUSCULAR | Status: DC
  Administered 2021-12-07 – 2021-12-08 (×2): 2 [IU] via SUBCUTANEOUS
  Administered 2021-12-08: 1 [IU] via SUBCUTANEOUS
  Filled 2021-12-06 (×3): qty 1

## 2021-12-06 MED ORDER — ALBUTEROL SULFATE HFA 108 (90 BASE) MCG/ACT IN AERS: 2.0000 | INHALATION_SPRAY | RESPIRATORY_TRACT | Status: DC | PRN

## 2021-12-06 MED ORDER — DM-GUAIFENESIN ER 30-600 MG PO TB12: 1.0000 | ORAL_TABLET | Freq: Two times a day (BID) | ORAL | Status: DC | PRN

## 2021-12-06 MED ORDER — ASPIRIN 81 MG PO TBEC
81.0000 mg | DELAYED_RELEASE_TABLET | Freq: Every day | ORAL | Status: DC
  Administered 2021-12-07 – 2021-12-08 (×2): 81 mg via ORAL
  Filled 2021-12-06 (×2): qty 1

## 2021-12-06 MED ORDER — HEPARIN BOLUS VIA INFUSION
3400.0000 [IU] | Freq: Once | INTRAVENOUS | Status: AC
Start: 2021-12-06 — End: 2021-12-06
  Administered 2021-12-06: 3400 [IU] via INTRAVENOUS
  Filled 2021-12-06: qty 3400

## 2021-12-06 MED ORDER — ENOXAPARIN SODIUM 30 MG/0.3ML IJ SOSY: 30.0000 mg | PREFILLED_SYRINGE | INTRAMUSCULAR | Status: DC

## 2021-12-06 MED ORDER — HEPARIN (PORCINE) 25000 UT/250ML-% IV SOLN
900.0000 [IU]/h | INTRAVENOUS | Status: DC
  Administered 2021-12-06: 700 [IU]/h via INTRAVENOUS
  Administered 2021-12-07: 900 [IU]/h via INTRAVENOUS
  Filled 2021-12-06 (×2): qty 250

## 2021-12-06 MED ORDER — IPRATROPIUM-ALBUTEROL 0.5-2.5 (3) MG/3ML IN SOLN
3.0000 mL | Freq: Four times a day (QID) | RESPIRATORY_TRACT | Status: DC | PRN
Start: 2021-12-06 — End: 2021-12-08

## 2021-12-06 NOTE — Assessment & Plan Note (Signed)
This is an incidental findings on CT angiogram -Follow-up with PCP

## 2021-12-06 NOTE — Assessment & Plan Note (Signed)
Stable -f/u with BMP 

## 2021-12-06 NOTE — ED Notes (Signed)
Next trop will be due at 1800.   3rd trop just drawn.

## 2021-12-06 NOTE — Assessment & Plan Note (Addendum)
2D echo on 06/01/2021 showed EF 45-50% with grade 1 diastolic dysfunction.  Patient does not have leg edema or JVD.  CHF is appears compensated.  BNP 384 -Continue home spironolactone, torsemide

## 2021-12-06 NOTE — Assessment & Plan Note (Signed)
Recent A1c 6.7, well controlled.  Patient is taking Humalog and Lantus 8 units daily -Sliding scale insulin -Glargine insulin 6 units daily

## 2021-12-06 NOTE — Progress Notes (Signed)
       CROSS COVER NOTE  NAME: Lindsey Pollard MRN: [id number] DOB : 1941/06/17    Date of Service   12/06/2021  HPI/Events of Note   Discussed code status with patient at bedside at her request. She states she would like to be coded in the event of a cardiac arrest but does not want to be intubated. She would like compressions, ACLS medications, shocking, and NIPPV if needed.  She also states she would like her Lindsey Pollard listed in the chart to be her surrogate decision maker in the event she is unable to make decisions for herself.  I called Lindsey Pollard and discussed my conversation with Lindsey Pollard with him. He told me that they had discussed code status today and that she'd told him she would like everything done except intubation. Lindsey Pollard states this is in line with an advanced directive they previously filled out.  Interventions   Plan: Partial code- NO INTUBATION       Bishop Limbo DNP, MHA, FNP-BC Nurse Practitioner [facility] Pager [phone]

## 2021-12-06 NOTE — ED Triage Notes (Signed)
Pt reports being woken up this morning with a sharp chest pain. Pt took 4 aspriin at home and was given 3nitro en route with EMS. PT was nauseated with EMS but not complaining of nausea now. Pt cbg was 227 with EMS. Pt PMH heart attack 2 years ago and stating she had a bypass surgery.

## 2021-12-06 NOTE — Assessment & Plan Note (Signed)
Lipitor 

## 2021-12-06 NOTE — Assessment & Plan Note (Addendum)
Chest pain due to Unstable Angina History of CAD: S/p of CABG.   Troponin level 55, 108, 788 >> peaked at 1593. --Mgmt per Cardiology --Heparin x 48 hrs as outlined --Mgmt as outlined

## 2021-12-06 NOTE — Progress Notes (Signed)
Admission profile updated. ?

## 2021-12-06 NOTE — Consult Note (Signed)
ANTICOAGULATION CONSULT NOTE - Initial Consult  Pharmacy Consult for heparin infusion Indication: chest pain/ACS  Allergies  Allergen Reactions   Tetanus Toxoids Swelling    Tetanus and diphtheria toxids   Penicillin G Rash    Patient Measurements: Weight: 57.4 kg (126 lb 8.7 oz) Heparin Dosing Weight: 57.4 kg   Vital Signs: Temp: 98.5 F (36.9 C) (06/14 0838) Temp Source: Oral (06/14 0838) BP: 154/45 (06/14 1600) Pulse Rate: 57 (06/14 1600)  Labs: Recent Labs    12/06/21 0837 12/06/21 1024 12/06/21 1142  HGB 10.4*  --   --   HCT 32.6*  --   --   PLT 237  --   --   APTT  --   --  22*  LABPROT  --   --  13.1  INR  --   --  1.0  CREATININE 1.46*  --   --   TROPONINIHS 55* 108*  --     Estimated Creatinine Clearance: 23.7 mL/min (A) (by C-G formula based on SCr of 1.46 mg/dL (H)).   Medical History: Past Medical History:  Diagnosis Date   Diabetes mellitus without complication ([place])    Hyperlipidemia    Hypertension    Myocardial infarction ([place])     Medications:  No prior AC noted   Assessment: 81yoF with a PMH of CAD s/p CABG x2 , presented to ED with chest pain. High-sensitivity troponin elevated and uptrending 55>108> 788. Pharmacy has been consulted for initiation and management of heparin infusion  Goal of Therapy:  Heparin level 0.3-0.7 units/ml Monitor platelets by anticoagulation protocol: Yes   Plan:  Give 3400 units bolus x 1 Start heparin infusion at 700 units/hr Check anti-Xa level in 8 hours and daily while on heparin Continue to monitor H&H and platelets  Dorothe Pea, PharmD, BCPS Clinical Pharmacist   12/06/2021,4:46 PM

## 2021-12-06 NOTE — Consult Note (Signed)
[place] NOTE       Patient ID: DYEMOND WORMAN MRN: [id number] DOB/AGE: Dec 07, 1940 81 y.o.  Admit date: 12/06/2021 Referring Physician Dr. Ivor Costa Primary Physician Dr. [name] Primary Cardiologist Dr. 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  Cardiology is consulted for further assistance.  Patient states she woke up out of her sleep this morning at 6 AM with right shoulder pain that radiated to her right chest, worse with walking to the bathroom, and unrelieved by her home nitroglycerin and associated with feeling sweaty and cold.  This is her typical anginal symptom, similar to when she presented in December 2022 at her heart cath.  She says she has had to take 2 nitroglycerin every day due to recurrent exertional angina, usually brought on by walking to the mailbox or doing house chores.  She reports compliance with her aspirin and Plavix and all of her other medications.  She denies any palpitations, shortness of breath.  She has occasional lower extremity edema that usually resolves with elevation of her lower legs.  She took 4 baby aspirin at home and was given nitroglycerin spray without relief, on presentation she rated the pain a 9 out of 10.  Her discomfort eventually resolved with morphine x1 , and at my time of evaluation she is comfortable, denying recurrence of her anginal symptoms.  Recent vitals are notable for a blood pressure of 154/45, heart rate in sinus bradycardia at 57 on telemetry, she is saturating well on room air.  Labs are notable for a potassium of 4.5, BUN/creatinine 37/1.46, GFR 36 which is around her  baseline.  BNP slightly elevated at 384, high-sensitivity troponin elevated and uptrending 55-1 08 with repeats pending.  This is notably higher than it was 2 months ago in April at 26-28 but much lower than December 2022 with her non-STEMI with peak at 3200.)  Chest x-ray with cephalization of pulmonary vasculature suggesting pulmonary edema.  CTA chest without evidence of pulmonary embolism.   Review of systems complete and found to be negative unless listed above     Past Medical History:  Diagnosis Date   Diabetes mellitus without complication ([place])    Hyperlipidemia    Hypertension    Myocardial infarction [facility])     Past Surgical History:  Procedure Laterality Date   CARDIAC SURGERY     bypass   CORONARY/GRAFT ACUTE MI REVASCULARIZATION N/A 06/01/2021   Procedure: Coronary/Graft Acute MI Revascularization;  Surgeon: Yolonda Kida, MD;  Location: [place] CV LAB;  Service: Cardiovascular;  Laterality: N/A;   EYE SURGERY     cataract extraction   LEFT HEART CATH AND CORONARY ANGIOGRAPHY N/A 06/01/2021   Procedure: LEFT HEART CATH AND CORONARY ANGIOGRAPHY;  Surgeon: Yolonda Kida, MD;  Location: [place] CV LAB;  Service: Cardiovascular;  Laterality: N/A;    (Not in a hospital admission)  Social History   Socioeconomic History   Marital status: Divorced    Spouse name: Not on file   Number of children: Not on file   Years of education: Not on file   Highest education  level: Not on file  Occupational History   Not on file  Tobacco Use   Smoking status: Former    Types: Cigarettes   Smokeless tobacco: Never  Substance and Sexual Activity   Alcohol use: Not Currently   Drug use: Not Currently   Sexual activity: Not on file  Other Topics Concern   Not on file  Social History Narrative   Not on file   Social Determinants of Health   Financial Resource Strain: Not on file  Food Insecurity: Not on file  Transportation Needs: Not on file  Physical  Activity: Not on file  Stress: Not on file  Social Connections: Not on file  Intimate Partner Violence: Not on file    Family History  Problem Relation Age of Onset   Congestive Heart Failure Mother    Diabetes Father    Breast cancer Neg Hx       PHYSICAL EXAM General: Elderly and frail Caucasian female, well nourished, in no acute distress.  Sitting upright in ED stretcher HEENT:  Normocephalic and atraumatic.  Somewhat hard of hearing but reads lips well. Neck:  No JVD.  Lungs: Normal respiratory effort on room air.  Trace crackles bilaterally  Heart: Bradycardic but regular . Normal S1 and S2 without gallops, 2/6 systolic murmur best heard at the RUSB. DP pulses 2+ bilaterally. Abdomen: Obese appearing.  Msk: Normal strength and tone for age. Extremities: Warm and well perfused. No clubbing, cyanosis.  No peripheral edema.  Neuro: Alert and oriented X 3. Psych:  Answers questions appropriately.   Labs:   Lab Results  Component Value Date   WBC 8.6 12/06/2021   HGB 10.4 (L) 12/06/2021   HCT 32.6 (L) 12/06/2021   MCV 96.4 12/06/2021   PLT 237 12/06/2021    Recent Labs  Lab 12/06/21 0837  NA 135  K 4.5  CL 103  CO2 24  BUN 37*  CREATININE 1.46*  CALCIUM 8.7*  GLUCOSE 185*   No results found for: "CKTOTAL", "CKMB", "CKMBINDEX", "TROPONINI"  Lab Results  Component Value Date   CHOL 127 06/01/2021   Lab Results  Component Value Date   HDL 47 06/01/2021   Lab Results  Component Value Date   LDLCALC 49 06/01/2021   Lab Results  Component Value Date   TRIG 154 (H) 06/01/2021   Lab Results  Component Value Date   CHOLHDL 2.7 06/01/2021   No results found for: "LDLDIRECT"    Radiology: CT Angio Chest PE W/Cm &/Or Wo Cm  Result Date: 12/06/2021 CLINICAL DATA:  Pulmonary embolism (PE) suspected, high prob EXAM: CT ANGIOGRAPHY CHEST WITH CONTRAST TECHNIQUE: Multidetector CT imaging of the chest was performed using the standard protocol during bolus  administration of intravenous contrast. Multiplanar CT image reconstructions and MIPs were obtained to evaluate the vascular anatomy. RADIATION DOSE REDUCTION: This exam was performed according to the departmental dose-optimization program which includes automated exposure control, adjustment of the mA and/or kV according to patient size and/or use of iterative reconstruction technique. CONTRAST:  33mL OMNIPAQUE IOHEXOL 350 MG/ML SOLN COMPARISON:  None Available. FINDINGS: Cardiovascular: Satisfactory opacification of the pulmonary arteries to the segmental level. No evidence of pulmonary embolism. Mild cardiomegaly. No pericardial disease. Prior CABG. Extensive native coronary artery calcification. Reflux of contrast into the IVC and hepatic veins. Moderate atherosclerosis of the thoracic aorta. Mediastinum/Nodes: Mildly enlarged precarinal lymph node measuring 1.1 cm, likely reactive. No axillary adenopathy. No hilar adenopathy. The thyroid is unremarkable. Small hiatal hernia. Lungs/Pleura: There  is interlobular septal thickening and ground-glass opacities bilaterally. No airspace consolidation. There is a 4 mm pulmonary nodule in the right lung base (series 5, image 94). Upper Abdomen: No acute abnormality. Musculoskeletal: No chest wall abnormality. No acute or significant osseous findings. Review of the MIP images confirms the above findings. IMPRESSION: No evidence of pulmonary embolism.  Mild pulmonary edema. 4 mm pulmonary nodule in the right lung base (series 5, image 94). No routine follow-up imaging is recommended per Fleischner guidelines. These guidelines do not apply to immunocompromised patients and patients with cancer. Follow up in patients with significant comorbidities as clinically warranted. Reference: Radiology. 2017; 284(1):228-43. Electronically Signed   By: Maurine Simmering M.D.   On: 12/06/2021 09:54   DG Chest 2 View  Result Date: 12/06/2021 CLINICAL DATA:  81 year old female with history  of chest pain. EXAM: CHEST - 2 VIEW COMPARISON:  Chest x-ray 06/01/2021. FINDINGS: There is cephalization of the pulmonary vasculature and slight indistinctness of the interstitial markings suggestive of mild pulmonary edema. No pleural effusions. Mild cardiomegaly. Upper mediastinal contours are within normal limits. Atherosclerotic calcifications are noted in the thoracic aorta. Status post median sternotomy. IMPRESSION: 1. The appearance the chest suggests mild congestive heart failure, as above. 2. Aortic atherosclerosis. Electronically Signed   By: Vinnie Langton M.D.   On: 12/06/2021 09:04    LHC and coronary angiography 06/01/2021    Mid RCA lesion is 50% stenosed.   Ost RCA lesion is 50% stenosed.   Ost LM to Ost LAD lesion is 95% stenosed.   Prox LAD to Mid LAD lesion is 100% stenosed.   Mid LAD lesion is 100% stenosed.   Origin lesion is 100% stenosed.   The left ventricular systolic function is normal.   LV end diastolic pressure is normal.   The left ventricular ejection fraction is 55-65% by visual estimate.   Conclusion Non-STEMI presentation with unstable features diffuse ST depression persistent anginal chest pain.  EKG did not meet STEMI criteria but clinically the patient presented with what appeared to be an acute coronary syndrome and cardiac cath was recommended emergently. Left ventricular function appeared to be relatively normal greater than 55% with mild inferior hypo- Coronaries showed diffuse left main disease 95% Mid LAD 100% CTO grafted Circumflex moderate size with minor irregularities RCA large very large long ostial to distal stent widely patent except for one focal mid area of 50% in-stent restenosis TIMI-3 flow. Patient found to have severe peripheral vascular disease and right femoral artery and tortuous aorta Catheters and sheath were removed Intervention was deferred Patient was treated aggressively medically in ICU  ECHO 06/01/2021  1. Left ventricular  ejection fraction, by estimation, is 45 to 50%. The  left ventricle has mildly decreased function. The left ventricle has no  regional wall motion abnormalities. The left ventricular internal cavity  size was mildly dilated. Left  ventricular diastolic parameters are consistent with Grade I diastolic  dysfunction (impaired relaxation).   2. Right ventricular systolic function is normal. The right ventricular  size is normal.   3. The mitral valve is normal in structure. Mild mitral valve  regurgitation. No evidence of mitral stenosis.   4. The aortic valve is normal in structure. Aortic valve regurgitation is  mild to moderate. Mild aortic valve stenosis.   5. The inferior vena cava is normal in size with greater than 50%  respiratory variability, suggesting right atrial pressure of 3 mmHg.   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  Cardiology is consulted for further assistance.  #Unstable angina #CAD s/p CABG x2 The patient reports her typical anginal symptoms right shoulder pain that radiates to the right side of her chest, associated with feeling sweaty and cold, and worse with exertion (walking to the bathroom or mailbox).  She reports taking 2 nitroglycerin every day until she is recently run out of the pill bottle due to this exertional angina.  She has extensive CAD with recent non-STEMI with peak troponin of 3200 in December where left heart cath revealed diffuse disease but patent LIMA to LAD and a small area of 50% ISR in  her RCA stent.  She was treated medically with aspirin and Plavix and has been compliant with them since.  Her EKG on presentation is without acute ischemic changes, but her troponin is uptrending 55-100 with repeats pending. 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 -Continue GDMT with metoprolol XL, lisinopril 20 mg, torsemide 20 mg daily -Strict I's/O, daily weights  #CKD 3 Renal function currently around baseline with creatinine of 1.46, GFR 36,   This patient's plan of care was discussed and created with Dr. Clayborn Bigness and he is in agreement.  Signed: Tristan Schroeder , PA-C 12/06/2021, 3:51 PM [facility]

## 2021-12-06 NOTE — Assessment & Plan Note (Addendum)
Presented with chest pain consistent with unstable angina. Pt has extensive CAD with recent non-STEMI with peak troponin of 3200 in December where left heart cath revealed diffuse disease but patent LIMA to LAD and a small area of 50% ISR in her RCA stent.  She was treated medically with aspirin and Plavix and has been compliant with them since. --See cardiology recommendations --Currently on heparin --Plavix on hold, resume after heparin --On ASA 81 mg, Imdur, Lipitor, Toprol-XL with hold parameters for BP and HR

## 2021-12-06 NOTE — ED Notes (Addendum)
Pts blood pressure appears to be soft with low diastolic readings. Pt asymptomatic at this time.  Katy Foust floor coverage notified.

## 2021-12-06 NOTE — Assessment & Plan Note (Signed)
See above

## 2021-12-06 NOTE — Assessment & Plan Note (Signed)
-   IV hydralazine as needed -Patient is on torsemide, spironolactone -Metoprolol and Zestoretic

## 2021-12-06 NOTE — H&P (Signed)
History and Physical    Lindsey Pollard [id number] DOB: 1940-12-09 DOA: 12/06/2021  Referring MD/NP/PA:   PCP: [name], MD   Patient coming from:  The patient is coming from home.    Chief Complaint: Chest pain  HPI: Lindsey Pollard is a 81 y.o. female with medical history significant of CAD, s/p of CABG, HTN, HLD, DM, PAD, CHF with EF 45-50%, CKD-3a, RLS, who presents with chest pain.   Patient states that her chest pain started in the early morning, which is located in the center chest, dull, initially 9 out of 10 in severity, currently chest pain-free, nonradiating.  Denies shortness breath, cough, fever or chills.  No nausea, vomiting, diarrhea or abdominal pain.  No symptoms of UTI. Pt took 324 mg of ASA and NTG. Her chest pain improved.   Data Reviewed and ED Course: pt was found to have troponin 55, 108, INR 1.0, PTT 22, BNP 384, WBC 8.6, stable renal function, temperature normal, blood pressure 133/40, heart rate 59, RR 14, oxygen saturation 96% on room air.  CT angiograms negative for PE, but showed 4 mm of right basilar pulmonary nodule.  Patient is placed on telemetry bed for patient, Dr. Clayborn Bigness of cardiology is consulted  EKG: I have personally reviewed.  Sinus rhythm, QTc 484, borderline LAD, poor IV progression   Review of Systems:   General: no fevers, chills, no body weight gain, has fatigue HEENT: no blurry vision, hearing changes or sore throat Respiratory: no dyspnea, coughing, wheezing CV: has chest pain, no palpitations GI: no nausea, vomiting, abdominal pain, diarrhea, constipation GU: no dysuria, burning on urination, increased urinary frequency, hematuria  Ext: no leg edema Neuro: no unilateral weakness, numbness, or tingling, no vision change or hearing loss Skin: no rash, no skin tear. MSK: No muscle spasm, no deformity, no limitation of range of movement in spin Heme: No easy bruising.  Travel history: No recent long distant  travel.   Allergy:  Allergies  Allergen Reactions   Tetanus Toxoids Swelling    Tetanus and diphtheria toxids   Penicillin G Rash    Past Medical History:  Diagnosis Date   Diabetes mellitus without complication ([place])    Hyperlipidemia    Hypertension    Myocardial infarction [facility])     Past Surgical History:  Procedure Laterality Date   CARDIAC SURGERY     bypass   CORONARY/GRAFT ACUTE MI REVASCULARIZATION N/A 06/01/2021   Procedure: Coronary/Graft Acute MI Revascularization;  Surgeon: Yolonda Kida, MD;  Location: [place] [facility];  Service: Cardiovascular;  Laterality: N/A;   EYE SURGERY     cataract extraction   LEFT HEART CATH AND CORONARY ANGIOGRAPHY N/A 06/01/2021   Procedure: LEFT HEART CATH AND CORONARY ANGIOGRAPHY;  Surgeon: Yolonda Kida, MD;  Location: [place] [facility];  Service: Cardiovascular;  Laterality: N/A;    Social History:  reports that she has quit smoking. Her smoking use included cigarettes. She has never used smokeless tobacco. She reports that she does not currently use alcohol. She reports that she does not currently use drugs.  Family History:  Family History  Problem Relation Age of Onset   Congestive Heart Failure Mother    Diabetes Father    Breast cancer Neg Hx      Prior to Admission medications   Medication Sig Start Date End Date Taking? Authorizing Provider  acetaminophen (TYLENOL) 325 MG tablet Take 650 mg by mouth at bedtime.    [provider]  albuterol (VENTOLIN HFA) 108 (90 Base) MCG/ACT inhaler Inhale 1-2 puffs into the lungs every 4 (four) hours as needed for shortness of breath. 08/09/21   [provider]  aspirin 81 MG EC tablet Take by mouth.    [provider]  atorvastatin (LIPITOR) 80 MG tablet Take 80 mg by mouth every evening. 07/06/20   [provider]  baclofen (LIORESAL) 10 MG tablet Take 10 mg by mouth 3 (three) times daily. 10/01/20   [provider]   Cholecalciferol 25 MCG (1000 UT) capsule Take 1,000 Units by mouth 2 (two) times daily.    [provider]  clopidogrel (PLAVIX) 75 MG tablet Take 1 tablet (75 mg total) by mouth daily. Patient not taking: Reported on 10/03/2021 06/04/21   Loletha Grayer, MD  insulin glargine (LANTUS) 100 UNIT/ML injection Inject 8 Units into the skin at bedtime. 07/24/21   [provider]  insulin lispro (HUMALOG) 100 UNIT/ML KwikPen Inject 0-2 Units into the skin 3 (three) times daily. 07/24/21   [provider]  ipratropium-albuterol (DUONEB) 0.5-2.5 (3) MG/3ML SOLN Take 3 mLs by nebulization every 6 (six) hours as needed (shortness of breath). 08/29/21   [provider]  isosorbide mononitrate (IMDUR) 60 MG 24 hr tablet Take 1 tablet (60 mg total) by mouth daily. 06/04/21   Loletha Grayer, MD  lisinopril-hydrochlorothiazide (ZESTORETIC) 20-12.5 MG tablet Take 1 tablet by mouth daily. 08/09/21   [provider]  metoprolol succinate (TOPROL-XL) 50 MG 24 hr tablet Take 50 mg by mouth daily. 05/13/21   [provider]  Multiple Vitamin (MULTIVITAMIN WITH MINERALS) TABS tablet Take 1 tablet by mouth daily.    [provider]  nitroGLYCERIN (NITROSTAT) 0.4 MG SL tablet Place 0.4 mg under the tongue every 5 (five) minutes as needed for chest pain. 09/01/20   [provider]  nystatin (MYCOSTATIN/NYSTOP) powder Apply three times a day under skin folds in groin Patient taking differently: Apply 1 application. topically 3 (three) times daily as needed (under skin folds in groin). 06/03/21   Loletha Grayer, MD  senna (SENOKOT) 8.6 MG tablet Take 2 tablets by mouth at bedtime.    [provider]  SPIRIVA HANDIHALER 18 MCG inhalation capsule Place 1 capsule into inhaler and inhale daily as needed for shortness of breath. 08/09/21   [provider]  spironolactone (ALDACTONE) 25 MG tablet Take 12.5 mg by mouth daily. 09/05/21   [provider]  terbinafine (LAMISIL) 1 % cream Apply twice a day to heels 06/03/21   Loletha Grayer, MD  torsemide (DEMADEX) 20 MG tablet Take 20 mg by mouth daily. 08/31/21   [provider]    Physical Exam: Vitals:   12/06/21 1300 12/06/21 1330 12/06/21 1600 12/06/21 1730  BP: (!) 125/42 (!) 117/38 (!) 154/45 (!) 137/40  Pulse: (!) 53 (!) 53 (!) 57 (!) 59  Resp: (!) 8 10 13 13   Temp:      TempSrc:      SpO2: 95% 97% 94% 96%  Weight:       General: Not in acute distress HEENT:       Eyes: PERRL, EOMI, no scleral icterus.       ENT: No discharge from the ears and nose, no pharynx injection, no tonsillar enlargement.        Neck: No JVD, no bruit, no mass felt. Heme: No neck lymph node enlargement. Cardiac: S1/S2, RRR, No murmurs, No gallops or rubs. Respiratory: No rales, wheezing, rhonchi  or rubs. GI: Soft, nondistended, nontender, no rebound pain, no organomegaly, BS present. GU: No hematuria Ext: No pitting leg edema bilaterally. 1+DP/PT pulse bilaterally. Musculoskeletal: No joint deformities, No joint redness or warmth, no limitation of ROM in spin. Skin: No rashes.  Neuro: Alert, oriented X3, cranial nerves II-XII grossly intact, moves all extremities normally.  Psych: Patient is not psychotic, no suicidal or hemocidal ideation.  Labs on Admission: I have personally reviewed following labs and imaging studies  CBC: Recent Labs  Lab 12/06/21 0837  WBC 8.6  NEUTROABS 5.9  HGB 10.4*  HCT 32.6*  MCV 96.4  PLT 123XX123   Basic Metabolic Panel: Recent Labs  Lab 12/06/21 0837  NA 135  K 4.5  CL 103  CO2 24  GLUCOSE 185*  BUN 37*  CREATININE 1.46*  CALCIUM 8.7*   GFR: Estimated Creatinine Clearance: 23.7 mL/min (A) (by C-G formula based on SCr of 1.46 mg/dL (H)). Liver Function Tests: No results for input(s): "AST", "ALT", "ALKPHOS", "BILITOT", "PROT", "ALBUMIN" in the last 168 hours. No results for input(s): "LIPASE", "AMYLASE" in the last 168  hours. No results for input(s): "AMMONIA" in the last 168 hours. Coagulation Profile: Recent Labs  Lab 12/06/21 1142  INR 1.0   Cardiac Enzymes: No results for input(s): "CKTOTAL", "CKMB", "CKMBINDEX", "TROPONINI" in the last 168 hours. BNP (last 3 results) No results for input(s): "PROBNP" in the last 8760 hours. HbA1C: Recent Labs    12/06/21 1142  HGBA1C 6.8*   CBG: Recent Labs  Lab 12/06/21 1524 12/06/21 1729  GLUCAP 76 92   Lipid Profile: No results for input(s): "CHOL", "HDL", "LDLCALC", "TRIG", "CHOLHDL", "LDLDIRECT" in the last 72 hours. Thyroid Function Tests: No results for input(s): "TSH", "T4TOTAL", "FREET4", "T3FREE", "THYROIDAB" in the last 72 hours. Anemia Panel: No results for input(s): "VITAMINB12", "FOLATE", "FERRITIN", "TIBC", "IRON", "RETICCTPCT" in the last 72 hours. Urine analysis:    Component Value Date/Time   COLORURINE STRAW (A) 10/03/2021 1215   APPEARANCEUR CLEAR (A) 10/03/2021 1215   LABSPEC 1.006 10/03/2021 1215   PHURINE 6.0 10/03/2021 1215   GLUCOSEU NEGATIVE 10/03/2021 1215   HGBUR NEGATIVE 10/03/2021 1215   BILIRUBINUR NEGATIVE 10/03/2021 1215   KETONESUR NEGATIVE 10/03/2021 1215   PROTEINUR NEGATIVE 10/03/2021 1215   NITRITE NEGATIVE 10/03/2021 1215   LEUKOCYTESUR NEGATIVE 10/03/2021 1215   Sepsis Labs: @LABRCNTIP (procalcitonin:4,lacticidven:4) )No results found for this or any previous visit (from the past 240 hour(s)).   Radiological Exams on Admission: CT Angio Chest PE W/Cm &/Or Wo Cm  Result Date: 12/06/2021 CLINICAL DATA:  Pulmonary embolism (PE) suspected, high prob EXAM: CT ANGIOGRAPHY CHEST WITH CONTRAST TECHNIQUE: Multidetector CT imaging of the chest was performed using the standard protocol during bolus administration of intravenous contrast. Multiplanar CT image reconstructions and MIPs were obtained to evaluate the vascular anatomy. RADIATION DOSE REDUCTION: This exam was performed according to the departmental  dose-optimization program which includes automated exposure control, adjustment of the mA and/or kV according to patient size and/or use of iterative reconstruction technique. CONTRAST:  24mL OMNIPAQUE IOHEXOL 350 MG/ML SOLN COMPARISON:  None Available. FINDINGS: Cardiovascular: Satisfactory opacification of the pulmonary arteries to the segmental level. No evidence of pulmonary embolism. Mild cardiomegaly. No pericardial disease. Prior CABG. Extensive native coronary artery calcification. Reflux of contrast into the IVC and hepatic veins. Moderate atherosclerosis of the thoracic aorta. Mediastinum/Nodes: Mildly enlarged precarinal lymph node measuring 1.1 cm, likely reactive. No axillary adenopathy. No hilar adenopathy. The thyroid is unremarkable. Small hiatal hernia. Lungs/Pleura: There is  interlobular septal thickening and ground-glass opacities bilaterally. No airspace consolidation. There is a 4 mm pulmonary nodule in the right lung base (series 5, image 94). Upper Abdomen: No acute abnormality. Musculoskeletal: No chest wall abnormality. No acute or significant osseous findings. Review of the MIP images confirms the above findings. IMPRESSION: No evidence of pulmonary embolism.  Mild pulmonary edema. 4 mm pulmonary nodule in the right lung base (series 5, image 94). No routine follow-up imaging is recommended per Fleischner guidelines. These guidelines do not apply to immunocompromised patients and patients with cancer. Follow up in patients with significant comorbidities as clinically warranted. Reference: Radiology. 2017; 284(1):228-43. Electronically Signed   By: Maurine Simmering M.D.   On: 12/06/2021 09:54   DG Chest 2 View  Result Date: 12/06/2021 CLINICAL DATA:  81 year old female with history of chest pain. EXAM: CHEST - 2 VIEW COMPARISON:  Chest x-ray 06/01/2021. FINDINGS: There is cephalization of the pulmonary vasculature and slight indistinctness of the interstitial markings suggestive of mild  pulmonary edema. No pleural effusions. Mild cardiomegaly. Upper mediastinal contours are within normal limits. Atherosclerotic calcifications are noted in the thoracic aorta. Status post median sternotomy. IMPRESSION: 1. The appearance the chest suggests mild congestive heart failure, as above. 2. Aortic atherosclerosis. Electronically Signed   By: Vinnie Langton M.D.   On: 12/06/2021 09:04      Assessment/Plan Principal Problem:   Chest pain Active Problems:   NSTEMI (non-ST elevated myocardial infarction) (HCC)   CAD (coronary artery disease)   Chronic kidney disease, stage 3a (HCC)   Type II diabetes mellitus with renal manifestations (HCC)   Chronic combined systolic and diastolic CHF (congestive heart failure) (HCC)   HTN (hypertension)   HLD (hyperlipidemia)   Lung nodule   Principal Problem:   Chest pain Active Problems:   NSTEMI (non-ST elevated myocardial infarction) (HCC)   CAD (coronary artery disease)   Chronic kidney disease, stage 3a (HCC)   Type II diabetes mellitus with renal manifestations (HCC)   Chronic combined systolic and diastolic CHF (congestive heart failure) (HCC)   HTN (hypertension)   HLD (hyperlipidemia)   Lung nodule   Assessment and Plan: * Chest pain Chest pain, non-STEMI, history of CAD: S/p of CABG.  Troponin level 55, 108, 788.  Consulted Dr. Clayborn Bigness of cardiology - place to tele bed for observation - IV heparin  - Trend Trop - prn Nitroglycerin, Morphine, and aspirin, lipitor  - Risk factor stratification: will check FLP and A1C  - 2d echo   NSTEMI (non-ST elevated myocardial infarction) ([place]) See above  CAD (coronary artery disease) -see above  Chronic kidney disease, stage 3a ([place]) Stable -f/u with BMP  Type II diabetes mellitus with renal manifestations ([place]) Recent A1c 6.7, well controlled.  Patient is taking Humalog and Lantus 8 units daily -Sliding scale insulin -Glargine insulin 6 units daily  Chronic combined  systolic and diastolic CHF (congestive heart failure) ([place]) 2D echo on 06/01/2021 showed EF 45-50% with grade 1 diastolic dysfunction.  Patient does not have leg edema or JVD.  CHF is seem to be compensated.  BNP 384 -Continue home spironolactone, torsemide  HTN (hypertension) - IV hydralazine as needed -Patient is on torsemide, spironolactone -Metoprolol and Zestoretic  HLD (hyperlipidemia) - Lipitor  Lung nodule This is an incidental findings on CT angiogram -Follow-up with PCP             DVT ppx:  SQ Lovenox  Code Status: Full code per pt (her nephew is aware this decision)  Family Communication:  Yes, patient's  nephew  at bed side.       Disposition Plan:  Anticipate discharge back to previous environment  Consults called:  Dr. Clayborn Bigness of card  Admission status and Level of care: Telemetry Cardiac:    for obs   Severity of Illness:  The appropriate patient status for this patient is OBSERVATION. Observation status is judged to be reasonable and necessary in order to provide the required intensity of service to ensure the patient's safety. The patient's presenting symptoms, physical exam findings, and initial radiographic and laboratory data in the context of their medical condition is felt to place them at decreased risk for further clinical deterioration. Furthermore, it is anticipated that the patient will be medically stable for discharge from the hospital within 2 midnights of admission.        Date of Service 12/06/2021    Ivor Costa [facility]   If 7PM-7AM, please contact night-coverage [email or web address] 12/06/2021, 5:58 PM

## 2021-12-06 NOTE — ED Provider Notes (Signed)
[facility] Provider Note    Event Date/Time   First MD Initiated Contact with Patient 12/06/21 [phone]     (approximate)   History   Chief Complaint Chest Pain   HPI  Lindsey Pollard is a 81 y.o. female with past medical history of hypertension, hyperlipidemia, diabetes, CAD status post CABG, CHF, ischemic cardiomyopathy, and CKD who presents to the ED complaining of chest pain.  Patient reports that she was woken from sleep around 6:00 this morning with sharp pain in the center of her chest.  Pain has been constant and not exacerbated or alleviated by anything.  She denies any associated fevers, cough, shortness of breath, nausea, vomiting, abdominal pain, pain or swelling in her legs.  She describes symptoms as similar to her prior heart attack, states she had CABG performed a couple of years ago.  She took 4 baby aspirin at home without relief, was given 3 additional nitroglycerin by EMS without relief.  She currently describes pain as a 9 out of 10.     Physical Exam   Triage Vital Signs: ED Triage Vitals [12/06/21 0832]  Enc Vitals Group     BP      Pulse      Resp      Temp      Temp src      SpO2      Weight 126 lb 8.7 oz (57.4 kg)     Height      Head Circumference      Peak Flow      Pain Score 9     Pain Loc      Pain Edu?      Excl. in GC?     Most recent vital signs: Vitals:   12/06/21 0838 12/06/21 0900  BP:  (!) 133/40  Pulse:  (!) 50  Resp:  14  Temp: 98.5 F (36.9 C)   SpO2:  96%    Constitutional: Alert and oriented. Eyes: Conjunctivae are normal. Head: Atraumatic. Nose: No congestion/rhinnorhea. Mouth/Throat: Mucous membranes are moist.  Cardiovascular: Normal rate, regular rhythm. Grossly normal heart sounds.  2+ radial pulses bilaterally. Respiratory: Normal respiratory effort.  No retractions. Lungs CTAB.  No chest wall tenderness to palpation. Gastrointestinal: Soft and nontender. No distention. Musculoskeletal:  No lower extremity tenderness nor edema.  Neurologic:  Normal speech and language. No gross focal neurologic deficits are appreciated.    ED Results / Procedures / Treatments   Labs (all labs ordered are listed, but only abnormal results are displayed) Labs Reviewed  CBC WITH DIFFERENTIAL/PLATELET - Abnormal; Notable for the following components:      Result Value   RBC 3.38 (*)    Hemoglobin 10.4 (*)    HCT 32.6 (*)    All other components within normal limits  BASIC METABOLIC PANEL - Abnormal; Notable for the following components:   Glucose, Bld 185 (*)    BUN 37 (*)    Creatinine, Ser 1.46 (*)    Calcium 8.7 (*)    GFR, Estimated 36 (*)    All other components within normal limits  TROPONIN I (HIGH SENSITIVITY) - Abnormal; Notable for the following components:   Troponin I (High Sensitivity) 55 (*)    All other components within normal limits     EKG  ED ECG REPORT I, Chesley Noon, the attending physician, personally viewed and interpreted this ECG.   Date: 12/06/2021  EKG Time: 8:35  Rate: 61  Rhythm: normal  sinus rhythm  Axis: Normal  Intervals:none  ST&T Change: None  RADIOLOGY Chest x-ray reviewed and interpreted by me with no infiltrate, edema, or effusion.  PROCEDURES:  Critical Care performed: No  Procedures   MEDICATIONS ORDERED IN ED: Medications  morphine (PF) 4 MG/ML injection 4 mg (4 mg Intravenous Given 12/06/21 0840)  iohexol (OMNIPAQUE) 350 MG/ML injection 60 mL (75 mLs Intravenous Contrast Given 12/06/21 0933)     IMPRESSION / MDM / ASSESSMENT AND PLAN / ED COURSE  I reviewed the triage vital signs and the nursing notes.                              81 y.o. female with past medical history of hypertension, hyperlipidemia, diabetes, CAD status post CABG, ischemic cardiomyopathy, CHF, and CKD who presents to the ED complaining of sharp pain in the center of her chest that woke her from sleep this morning.  Patient's presentation is  most consistent with acute presentation with potential threat to life or bodily function.  Differential diagnosis includes, but is not limited to, ACS, PE, dissection, pneumonia, pneumothorax, musculoskeletal pain, GERD, anxiety.  Patient well-appearing and in no acute distress, vital signs are unremarkable, EKG shows no evidence of arrhythmia or ischemia.  Symptoms are atypical for ACS however patient describes them as similar to her prior MI.  Given she has not had relief with aspirin or nitroglycerin, we will give IV morphine for pain, chest x-ray and labs are pending at this time.  Chest x-ray is unremarkable and remainder of labs with stable chronic kidney disease, no significant anemia or leukocytosis noted.  Troponin is mildly elevated at 55, which is increased from her prior baseline in the 20s.  CTA was performed and negative for PE or other acute process, however patient would benefit from admission for high risk chest pain.  She does state that pain has resolved on reevaluation, case discussed with hospitalist for admission.      FINAL CLINICAL IMPRESSION(S) / ED DIAGNOSES   Final diagnoses:  Nonspecific chest pain  Hx of CABG     Rx / DC Orders   ED Discharge Orders     None        Note:  This document was prepared using Dragon voice recognition software and may include unintentional dictation errors.   Chesley Noon, MD 12/06/21 1026

## 2021-12-07 ENCOUNTER — Encounter: Payer: Self-pay | Admitting: Internal Medicine

## 2021-12-07 ENCOUNTER — Observation Stay
Admit: 2021-12-07 | Discharge: 2021-12-07 | Disposition: A | Discharge status: Home / Self Care | Payer: Medicare Other | Attending: Cardiology | Admitting: Cardiology

## 2021-12-07 DIAGNOSIS — G2581 Restless legs syndrome: Secondary | ICD-10-CM | POA: Diagnosis present

## 2021-12-07 DIAGNOSIS — I130 Hypertensive heart and chronic kidney disease with heart failure and stage 1 through stage 4 chronic kidney disease, or unspecified chronic kidney disease: Secondary | ICD-10-CM | POA: Diagnosis present

## 2021-12-07 DIAGNOSIS — Z7982 Long term (current) use of aspirin: Secondary | ICD-10-CM | POA: Diagnosis not present

## 2021-12-07 DIAGNOSIS — E1122 Type 2 diabetes mellitus with diabetic chronic kidney disease: Secondary | ICD-10-CM | POA: Diagnosis present

## 2021-12-07 DIAGNOSIS — Z79899 Other long term (current) drug therapy: Secondary | ICD-10-CM | POA: Diagnosis not present

## 2021-12-07 DIAGNOSIS — R911 Solitary pulmonary nodule: Secondary | ICD-10-CM | POA: Diagnosis present

## 2021-12-07 DIAGNOSIS — K219 Gastro-esophageal reflux disease without esophagitis: Secondary | ICD-10-CM | POA: Diagnosis present

## 2021-12-07 DIAGNOSIS — I5042 Chronic combined systolic (congestive) and diastolic (congestive) heart failure: Secondary | ICD-10-CM | POA: Diagnosis present

## 2021-12-07 DIAGNOSIS — Z794 Long term (current) use of insulin: Secondary | ICD-10-CM | POA: Diagnosis not present

## 2021-12-07 DIAGNOSIS — I200 Unstable angina: Secondary | ICD-10-CM

## 2021-12-07 DIAGNOSIS — Z8249 Family history of ischemic heart disease and other diseases of the circulatory system: Secondary | ICD-10-CM | POA: Diagnosis not present

## 2021-12-07 DIAGNOSIS — Z87891 Personal history of nicotine dependence: Secondary | ICD-10-CM | POA: Diagnosis not present

## 2021-12-07 DIAGNOSIS — Z7902 Long term (current) use of antithrombotics/antiplatelets: Secondary | ICD-10-CM | POA: Diagnosis not present

## 2021-12-07 DIAGNOSIS — E1151 Type 2 diabetes mellitus with diabetic peripheral angiopathy without gangrene: Secondary | ICD-10-CM | POA: Diagnosis present

## 2021-12-07 DIAGNOSIS — Z880 Allergy status to penicillin: Secondary | ICD-10-CM | POA: Diagnosis not present

## 2021-12-07 DIAGNOSIS — I25110 Atherosclerotic heart disease of native coronary artery with unstable angina pectoris: Secondary | ICD-10-CM | POA: Diagnosis present

## 2021-12-07 DIAGNOSIS — E785 Hyperlipidemia, unspecified: Secondary | ICD-10-CM | POA: Diagnosis present

## 2021-12-07 DIAGNOSIS — Z951 Presence of aortocoronary bypass graft: Secondary | ICD-10-CM | POA: Diagnosis not present

## 2021-12-07 DIAGNOSIS — I255 Ischemic cardiomyopathy: Secondary | ICD-10-CM | POA: Diagnosis present

## 2021-12-07 DIAGNOSIS — Z888 Allergy status to other drugs, medicaments and biological substances status: Secondary | ICD-10-CM | POA: Diagnosis not present

## 2021-12-07 DIAGNOSIS — I25118 Atherosclerotic heart disease of native coronary artery with other forms of angina pectoris: Secondary | ICD-10-CM | POA: Diagnosis not present

## 2021-12-07 DIAGNOSIS — R079 Chest pain, unspecified: Secondary | ICD-10-CM | POA: Diagnosis not present

## 2021-12-07 DIAGNOSIS — N1831 Chronic kidney disease, stage 3a: Secondary | ICD-10-CM | POA: Diagnosis present

## 2021-12-07 DIAGNOSIS — I252 Old myocardial infarction: Secondary | ICD-10-CM | POA: Diagnosis not present

## 2021-12-07 DIAGNOSIS — Z833 Family history of diabetes mellitus: Secondary | ICD-10-CM | POA: Diagnosis not present

## 2021-12-07 DIAGNOSIS — I214 Non-ST elevation (NSTEMI) myocardial infarction: Secondary | ICD-10-CM | POA: Diagnosis present

## 2021-12-07 LAB — CBC
HCT: 29.5 % — ABNORMAL LOW (ref 36.0–46.0)
Hemoglobin: 9.3 g/dL — ABNORMAL LOW (ref 12.0–15.0)
MCH: 29.9 pg (ref 26.0–34.0)
MCHC: 31.5 g/dL (ref 30.0–36.0)
MCV: 94.9 fL (ref 80.0–100.0)
Platelets: 216 10*3/uL (ref 150–400)
RBC: 3.11 MIL/uL — ABNORMAL LOW (ref 3.87–5.11)
RDW: 14.5 % (ref 11.5–15.5)
WBC: 8.6 10*3/uL (ref 4.0–10.5)
nRBC: 0 % (ref 0.0–0.2)

## 2021-12-07 LAB — BASIC METABOLIC PANEL
Anion gap: 5 (ref 5–15)
BUN: 36 mg/dL — ABNORMAL HIGH (ref 8–23)
CO2: 29 mmol/L (ref 22–32)
Calcium: 8.6 mg/dL — ABNORMAL LOW (ref 8.9–10.3)
Chloride: 102 mmol/L (ref 98–111)
Creatinine, Ser: 1.51 mg/dL — ABNORMAL HIGH (ref 0.44–1.00)
GFR, Estimated: 35 mL/min — ABNORMAL LOW (ref >60)
Glucose, Bld: 97 mg/dL (ref 70–99)
Potassium: 4.4 mmol/L (ref 3.5–5.1)
Sodium: 136 mmol/L (ref 135–145)

## 2021-12-07 LAB — ECHOCARDIOGRAM COMPLETE
AR max vel: 1.15 cm2
AV Area VTI: 1.12 cm2
AV Area mean vel: 0.99 cm2
AV Mean grad: 10 mmHg
AV Peak grad: 16.9 mmHg
Ao pk vel: 2.05 m/s
Area-P 1/2: 2.26 cm2
MV VTI: 1.74 cm2
P 1/2 time: 567 msec
S' Lateral: 3.1 cm
Weight: 2024.7 oz

## 2021-12-07 LAB — GLUCOSE, CAPILLARY
Glucose-Capillary: 171 mg/dL — ABNORMAL HIGH (ref 70–99)
Glucose-Capillary: 120 mg/dL — ABNORMAL HIGH (ref 70–99)
Glucose-Capillary: 128 mg/dL — ABNORMAL HIGH (ref 70–99)

## 2021-12-07 LAB — LIPID PANEL
Cholesterol: 102 mg/dL (ref 0–200)
HDL: 31 mg/dL — ABNORMAL LOW (ref >40)
LDL Cholesterol: 45 mg/dL (ref 0–99)
Total CHOL/HDL Ratio: 3.3 RATIO
Triglycerides: 128 mg/dL (ref <150)
VLDL: 26 mg/dL (ref 0–40)

## 2021-12-07 LAB — CBG MONITORING, ED: Glucose-Capillary: 81 mg/dL (ref 70–99)

## 2021-12-07 LAB — TROPONIN I (HIGH SENSITIVITY)
Troponin I (High Sensitivity): 1593 ng/L (ref <18)
Troponin I (High Sensitivity): 1471 ng/L (ref <18)

## 2021-12-07 LAB — HEPARIN LEVEL (UNFRACTIONATED)
Heparin Unfractionated: 0.34 IU/mL (ref 0.30–0.70)
Heparin Unfractionated: 0.17 IU/mL — ABNORMAL LOW (ref 0.30–0.70)
Heparin Unfractionated: 0.46 IU/mL (ref 0.30–0.70)

## 2021-12-07 MED ORDER — TIOTROPIUM BROMIDE MONOHYDRATE 18 MCG IN CAPS
1.0000 | ORAL_CAPSULE | Freq: Every day | RESPIRATORY_TRACT | Status: DC
Start: 2021-12-08 — End: 2021-12-08
  Administered 2021-12-08: 18 ug via RESPIRATORY_TRACT
  Filled 2021-12-07: qty 5

## 2021-12-07 MED ORDER — HEPARIN BOLUS VIA INFUSION
1700.0000 [IU] | Freq: Once | INTRAVENOUS | Status: AC
Start: 2021-12-07 — End: 2021-12-07
  Administered 2021-12-07: 1700 [IU] via INTRAVENOUS
  Filled 2021-12-07: qty 1700

## 2021-12-07 NOTE — TOC Initial Note (Signed)
Transition of Care [facility]) - Initial/Assessment Note    Patient Details  Name: Lindsey Pollard MRN: [id number] Date of Birth: 1940/11/26  Transition of Care [facility]) CM/SW Contact:    Truddie Hidden, RN Phone Number: 12/07/2021, 4:01 PM  Clinical Narrative:                  Transition of Care [facility]) Screening Note   Patient Details  Name: Lindsey Pollard Date of Birth: [birth year]   Transition of Care [facility]) CM/SW Contact:    Truddie Hidden, RN Phone Number: 12/07/2021, 4:01 PM    Transition of Care Department [facility]) has reviewed patient and no TOC needs have been identified at this time. We will continue to monitor patient advancement through interdisciplinary progression rounds. If new patient transition needs arise, please place a TOC consult.          Patient Goals and CMS Choice        Expected Discharge Plan and Services                                                Prior Living Arrangements/Services                       Activities of Daily Living Home Assistive Devices/Equipment: Cane (specify quad or straight) ADL Screening (condition at time of admission) Patient's cognitive ability adequate to safely complete daily activities?: Yes Is the patient deaf or have difficulty hearing?: No Does the patient have difficulty seeing, even when wearing glasses/contacts?: No Does the patient have difficulty concentrating, remembering, or making decisions?: No Patient able to express need for assistance with ADLs?: Yes Does the patient have difficulty dressing or bathing?: No Independently performs ADLs?: Yes (appropriate for developmental age) Does the patient have difficulty walking or climbing stairs?: No Weakness of Legs: None Weakness of Arms/Hands: None  Permission Sought/Granted                  Emotional Assessment              Admission diagnosis:  Unstable angina (HCC) [I20.0] Hx of CABG [Z95.1] Chest pain  [R07.9] Nonspecific chest pain [R07.9] Patient Active Problem List   Diagnosis Date Noted   Unstable angina (HCC) 12/07/2021   Chest pain 12/06/2021   NSTEMI (non-ST elevated myocardial infarction) (HCC) 12/06/2021   Chronic kidney disease, stage 3a (HCC) 12/06/2021   Lung nodule 12/06/2021   Type II diabetes mellitus with renal manifestations (HCC) 12/06/2021   Chronic combined systolic and diastolic CHF (congestive heart failure) (HCC) 12/06/2021   HTN (hypertension) 12/06/2021   HLD (hyperlipidemia) 12/06/2021   AKI (acute kidney injury) (HCC)    Anemia    Acute respiratory failure with hypoxia (HCC)    Chronic diastolic CHF (congestive heart failure) (HCC)    Fungal infection of the groin    Fungal infection of skin    Non-STEMI (non-ST elevated myocardial infarction) (HCC) 06/01/2021   Essential hypertension 06/01/2021   Carotid stenosis 10/26/2020   Atherosclerosis of native arteries of extremity with intermittent claudication (HCC) 09/16/2020   Carotid bruit present 09/16/2020   Healthcare maintenance 01/06/2019   Aortic calcification (HCC) 06/19/2017   Ischemic cardiomyopathy 06/19/2017   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/17/2017   PVD (peripheral vascular disease) (HCC) 06/14/2017   Morbid 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 ([facility] ) - [place], [place] - [address] [place] [address] Phone: [phone] Fax:  [phone]     Social Determinants of Health (SDOH) Interventions    Readmission Risk Interventions     No data to display

## 2021-12-07 NOTE — Consult Note (Signed)
ANTICOAGULATION CONSULT NOTE   Pharmacy Consult for heparin infusion Indication: chest pain/ACS  Allergies  Allergen Reactions   Tetanus Toxoids Swelling    Tetanus and diphtheria toxids   Penicillin G Rash    Patient Measurements: Weight: 57.4 kg (126 lb 8.7 oz) Heparin Dosing Weight: 57.4 kg   Vital Signs: BP: 121/39 (06/15 0230) Pulse Rate: 50 (06/15 0230)  Labs: Recent Labs    12/06/21 0837 12/06/21 1024 12/06/21 1142 12/06/21 1600 12/07/21 0212  HGB 10.4*  --   --   --   --   HCT 32.6*  --   --   --   --   PLT 237  --   --   --   --   APTT  --   --  22*  --   --   LABPROT  --   --  13.1  --   --   INR  --   --  1.0  --   --   HEPARINUNFRC  --   --   --   --  0.34  CREATININE 1.46*  --   --   --   --   TROPONINIHS 55* 108*  --  788*  --      Estimated Creatinine Clearance: 23.7 mL/min (A) (by C-G formula based on SCr of 1.46 mg/dL (H)).   Medical History: Past Medical History:  Diagnosis Date   Diabetes mellitus without complication (HCC)    Hyperlipidemia    Hypertension    Myocardial infarction (HCC)     Medications:  No prior AC noted   Assessment: 81yoF with a PMH of CAD s/p CABG x2 , presented to ED with chest pain. High-sensitivity troponin elevated and uptrending 55>108> 788. Pharmacy has been consulted for initiation and management of heparin infusion  Goal of Therapy:  Heparin level 0.3-0.7 units/ml Monitor platelets by anticoagulation protocol: Yes  6/15 0212 HL 0.34, therapeutic x 1   Plan:  Continue heparin infusion at 700 units/hr Recheck HL in 8 hrs to confirm CBC daily while on heparin  Otelia Sergeant, PharmD, [facility] 12/07/2021 3:42 AM

## 2021-12-07 NOTE — ED Notes (Signed)
BP kind of soft at this time, diastolic wise, last MAP was 58 113/35, MD aware. BP meds held at this time, MD agreeable and aware. Pt not symptomatic at this time.

## 2021-12-07 NOTE — Hospital Course (Signed)
81 y.o female with past medical history significant of CAD, s/p of CABG, HTN, HLD, DM, PAD, CHF with EF 45-50%, CKD-3a, RLS, who presented on 12/06/2021 for evaluation of chest pain since earlier that morning.  She took 324 mg ASA and SL NTG with improvement.  In the ED, initial troponin was 55.  Otherwise labs essentially unremarkable.  Vitals were stable, mild bradycardia in upper 50's.  CTA ruled out PE, showed a 4 mm right basilar pulmonary nodule.  No acute ischemic changes on EKG.  Admitted to hospitalist service with cardiology consulted for further evaluation and management.    Troponin was trended, peaked at 1593 then down-trended.  Chest pain attributed to unstable angina.    Treating with 48 hours heparin infusion and medical management.

## 2021-12-07 NOTE — ED Notes (Signed)
Bishop Limbo NP notified via phone and page of pts critical troponin level.

## 2021-12-07 NOTE — Progress Notes (Signed)
[facility] CLINIC CARDIOLOGY CONSULT NOTE       Patient ID: Lindsey Pollard MRN: [id number] DOB/AGE: 1941-05-24 81 y.o.  Admit date: 12/06/2021 Referring Physician Dr. Lorretta Harp Primary Physician Dr. Einar Crow Primary Cardiologist Dr. Juliann Pares Reason for Consultation chest pain   HPI: Lindsey Pollard is an 10yoF with a PMH of CAD s/p CABG x2 in 2018 (patent LIMA to LAD,  occluded SVG to OM1 by 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  Cardiology is consulted for further assistance.  Interval History: -seen this morning, sitting on the edge of ED stretcher using her phone  -denies further chest discomfort overnight, no complaints, troponin peaked at 1600 this morning -noted low diastolic BP overnight, antihypertensives held.  -echo performed this morning, pending read  Review of systems complete and found to be negative unless listed above     Past Medical History:  Diagnosis Date   Diabetes mellitus without complication (HCC)    Hyperlipidemia    Hypertension    Myocardial infarction [facility])     Past Surgical History:  Procedure Laterality Date   CARDIAC SURGERY     bypass   CORONARY/GRAFT ACUTE MI REVASCULARIZATION N/A 06/01/2021   Procedure: Coronary/Graft Acute MI Revascularization;  Surgeon: Alwyn Pea, MD;  Location: [facility] INVASIVE CV LAB;  Service: Cardiovascular;  Laterality: N/A;   EYE SURGERY     cataract extraction   LEFT HEART CATH AND CORONARY ANGIOGRAPHY N/A 06/01/2021   Procedure: LEFT HEART CATH AND CORONARY ANGIOGRAPHY;  Surgeon: Alwyn Pea, MD;  Location: [facility] INVASIVE CV LAB;  Service: Cardiovascular;  Laterality: N/A;    (Not in a hospital admission)  Social History   Socioeconomic History   Marital status: Divorced    Spouse name: Not on file   Number of children: Not  on file   Years of education: Not on file   Highest education level: Not on file  Occupational History   Not on file  Tobacco Use   Smoking status: Former    Types: Cigarettes   Smokeless tobacco: Never  Substance and Sexual Activity   Alcohol use: Not Currently   Drug use: Not Currently   Sexual activity: Not on file  Other Topics Concern   Not on file  Social History Narrative   Not on file   Social Determinants of Health   Financial Resource Strain: Not on file  Food Insecurity: Not on file  Transportation Needs: Not on file  Physical Activity: Not on file  Stress: Not on file  Social Connections: Not on file  Intimate Partner Violence: Not on file    Family History  Problem Relation Age of Onset   Congestive Heart Failure Mother    Diabetes Father    Breast cancer Neg Hx       PHYSICAL EXAM General: Elderly and frail Caucasian female, in no acute distress.  Sitting upright on side of ED stretcher HEENT:  Normocephalic and atraumatic.  Somewhat hard of hearing but reads lips well. Neck:  No JVD.  Lungs: Normal respiratory effort on room air.  Heart: Bradycardic but regular . Abdomen: Obese appearing.  Msk: Normal strength and tone for age. Extremities: Warm and well perfused. No clubbing, cyanosis.  No obvious peripheral edema.  Neuro: Alert and oriented X 3. Psych:  Answers questions appropriately.  Labs:   Lab Results  Component Value Date   WBC 8.6 12/07/2021   HGB 9.3 (L) 12/07/2021   HCT 29.5 (L) 12/07/2021   MCV 94.9 12/07/2021   PLT 216 12/07/2021    Recent Labs  Lab 12/07/21 0616  NA 136  K 4.4  CL 102  CO2 29  BUN 36*  CREATININE 1.51*  CALCIUM 8.6*  GLUCOSE 97    No results found for: "CKTOTAL", "CKMB", "CKMBINDEX", "TROPONINI"  Lab Results  Component Value Date   CHOL 102 12/07/2021   CHOL 127 06/01/2021   Lab Results  Component Value Date   HDL 31 (L) 12/07/2021   HDL 47 06/01/2021   Lab Results  Component Value Date    LDLCALC 45 12/07/2021   LDLCALC 49 06/01/2021   Lab Results  Component Value Date   TRIG 128 12/07/2021   TRIG 154 (H) 06/01/2021   Lab Results  Component Value Date   CHOLHDL 3.3 12/07/2021   CHOLHDL 2.7 06/01/2021   No results found for: "LDLDIRECT"    Radiology: CT Angio Chest PE W/Cm &/Or Wo Cm  Result Date: 12/06/2021 CLINICAL DATA:  Pulmonary embolism (PE) suspected, high prob EXAM: CT ANGIOGRAPHY CHEST WITH CONTRAST TECHNIQUE: Multidetector CT imaging of the chest was performed using the standard protocol during bolus administration of intravenous contrast. Multiplanar CT image reconstructions and MIPs were obtained to evaluate the vascular anatomy. RADIATION DOSE REDUCTION: This exam was performed according to the departmental dose-optimization program which includes automated exposure control, adjustment of the mA and/or kV according to patient size and/or use of iterative reconstruction technique. CONTRAST:  45mL OMNIPAQUE IOHEXOL 350 MG/ML SOLN COMPARISON:  None Available. FINDINGS: Cardiovascular: Satisfactory opacification of the pulmonary arteries to the segmental level. No evidence of pulmonary embolism. Mild cardiomegaly. No pericardial disease. Prior CABG. Extensive native coronary artery calcification. Reflux of contrast into the IVC and hepatic veins. Moderate atherosclerosis of the thoracic aorta. Mediastinum/Nodes: Mildly enlarged precarinal lymph node measuring 1.1 cm, likely reactive. No axillary adenopathy. No hilar adenopathy. The thyroid is unremarkable. Small hiatal hernia. Lungs/Pleura: There is interlobular septal thickening and ground-glass opacities bilaterally. No airspace consolidation. There is a 4 mm pulmonary nodule in the right lung base (series 5, image 94). Upper Abdomen: No acute abnormality. Musculoskeletal: No chest wall abnormality. No acute or significant osseous findings. Review of the MIP images confirms the above findings. IMPRESSION: No evidence of  pulmonary embolism.  Mild pulmonary edema. 4 mm pulmonary nodule in the right lung base (series 5, image 94). No routine follow-up imaging is recommended per Fleischner guidelines. These guidelines do not apply to immunocompromised patients and patients with cancer. Follow up in patients with significant comorbidities as clinically warranted. Reference: Radiology. 2017; 284(1):228-43. Electronically Signed   By: Maurine Simmering M.D.   On: 12/06/2021 09:54   DG Chest 2 View  Result Date: 12/06/2021 CLINICAL DATA:  81 year old female with history of chest pain. EXAM: CHEST - 2 VIEW COMPARISON:  Chest x-ray 06/01/2021. FINDINGS: There is cephalization of the pulmonary vasculature and slight indistinctness of the interstitial markings suggestive of mild pulmonary edema. No pleural effusions. Mild cardiomegaly. Upper mediastinal contours are within normal limits. Atherosclerotic calcifications are noted in the thoracic aorta. Status post median sternotomy. IMPRESSION: 1. The appearance the chest suggests mild congestive heart failure, as above. 2. Aortic atherosclerosis. Electronically Signed   By: Vinnie Langton M.D.   On: 12/06/2021 09:04    LHC and coronary angiography 06/01/2021    Mid RCA  lesion is 50% stenosed.   Ost RCA lesion is 50% stenosed.   Ost LM to Ost LAD lesion is 95% stenosed.   Prox LAD to Mid LAD lesion is 100% stenosed.   Mid LAD lesion is 100% stenosed.   Origin lesion is 100% stenosed.   The left ventricular systolic function is normal.   LV end diastolic pressure is normal.   The left ventricular ejection fraction is 55-65% by visual estimate.   Conclusion Non-STEMI presentation with unstable features diffuse ST depression persistent anginal chest pain.  EKG did not meet STEMI criteria but clinically the patient presented with what appeared to be an acute coronary syndrome and cardiac cath was recommended emergently. Left ventricular function appeared to be relatively normal greater  than 55% with mild inferior hypo- Coronaries showed diffuse left main disease 95% Mid LAD 100% CTO grafted Circumflex moderate size with minor irregularities RCA large very large long ostial to distal stent widely patent except for one focal mid area of 50% in-stent restenosis TIMI-3 flow. Patient found to have severe peripheral vascular disease and right femoral artery and tortuous aorta Catheters and sheath were removed Intervention was deferred Patient was treated aggressively medically in ICU  ECHO 06/01/2021  1. Left ventricular ejection fraction, by estimation, is 45 to 50%. The  left ventricle has mildly decreased function. The left ventricle has no  regional wall motion abnormalities. The left ventricular internal cavity  size was mildly dilated. Left  ventricular diastolic parameters are consistent with Grade I diastolic  dysfunction (impaired relaxation).   2. Right ventricular systolic function is normal. The right ventricular  size is normal.   3. The mitral valve is normal in structure. Mild mitral valve  regurgitation. No evidence of mitral stenosis.   4. The aortic valve is normal in structure. Aortic valve regurgitation is  mild to moderate. Mild aortic valve stenosis.   5. The inferior vena cava is normal in size with greater than 50%  respiratory variability, suggesting right atrial pressure of 3 mmHg.   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  Cardiology is consulted for further assistance.  #Unstable  angina #CAD s/p CABG x2 The patient reports her typical anginal symptoms right shoulder pain that radiates to the right side of her chest, associated with feeling sweaty and cold, and worse with exertion (walking to the bathroom or mailbox).  She reports taking 2 nitroglycerin every day until she is recently run out of the pill bottle due to this exertional angina.  She has extensive CAD with recent non-STEMI with peak troponin of 3200 in December where left heart cath revealed diffuse disease but patent LIMA to LAD and a small area of 50% ISR in her RCA stent.  She was treated medically with aspirin and Plavix and has been compliant with them since.  Her EKG on presentation is without acute ischemic changes, troponin is uptrending 55-100 -peak of 1600 and downtrending this morning. No further chest pain since yesterday.  -  S/p 325 mg aspirin, continue aspirin 81 mg daily -Continue heparin drip for 48 hours (ending 6/16 at 1730) -restart plavix tomorrow  -High intensity atorvastatin 80 mg daily -Continue metoprolol XL 50 mg daily with hold parameters for hypotension and bradycardia  -Trend troponin until peak -continue Imdur 30mg  daily -Repeat echocardiogram complete  -discussed risks and benefits of heart catheterization with patient and she wishes to avoid and invasive strategy and treat medically if at all possible, which is very reasonable considering difficulty with vascular access during prior heart cath and CKD  #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 -Continue GDMT with metoprolol XL, lisinopril 20 mg, torsemide 20 mg daily with hold parameters for hypotension  -Strict I's/O, daily weights  #CKD 3 Renal function currently around baseline with creatinine of 1.46, GFR 36 on admission - today Cr 1.51 and  GFR 35  This patient's plan of care was discussed and created with Dr. 06/2021 and he is in agreement.  Signed: Juliann Pares , PA-C 12/07/2021, 11:34 AM [facility] Cardiology

## 2021-12-07 NOTE — Progress Notes (Signed)
ANTICOAGULATION CONSULT NOTE   Pharmacy Consult for heparin infusion Indication: chest pain/ACS  Allergies  Allergen Reactions   Tetanus Toxoids Swelling    Tetanus and diphtheria toxids   Penicillin G Rash    Patient Measurements: Height: 4' 11.75" (151.8 cm) Weight: 58.2 kg (128 lb 4.9 oz) IBW/kg (Calculated) : 44.93 Heparin Dosing Weight: 57.4 kg   Vital Signs: Temp: 99 F (37.2 C) (06/15 1930) Temp Source: Oral (06/15 1930) BP: 114/31 (06/15 1930) Pulse Rate: 59 (06/15 1930)  Labs: Recent Labs    12/06/21 0837 12/06/21 1024 12/06/21 1142 12/06/21 1600 12/07/21 0212 12/07/21 0425 12/07/21 0616 12/07/21 0957 12/07/21 2104  HGB 10.4*  --   --   --   --  9.3*  --   --   --   HCT 32.6*  --   --   --   --  29.5*  --   --   --   PLT 237  --   --   --   --  216  --   --   --   APTT  --   --  22*  --   --   --   --   --   --   LABPROT  --   --  13.1  --   --   --   --   --   --   INR  --   --  1.0  --   --   --   --   --   --   HEPARINUNFRC  --   --   --   --  0.34  --   --  0.17* 0.46  CREATININE 1.46*  --   --   --   --   --  1.51*  --   --   TROPONINIHS 55*   < >  --  788*  --  1,593* 1,471*  --   --    < > = values in this interval not displayed.     Estimated Creatinine Clearance: 23.5 mL/min (A) (by C-G formula based on SCr of 1.51 mg/dL (H)).   Medical History: Past Medical History:  Diagnosis Date   Diabetes mellitus without complication (HCC)    Hyperlipidemia    Hypertension    Myocardial infarction (HCC)     Medications:  No prior AC noted   Assessment: 81yoF with a PMH of CAD s/p CABG x2 , presented to ED with chest pain. High-sensitivity troponin elevated and uptrending 55>108> 788. Pharmacy has been consulted for initiation and management of heparin infusion  Goal of Therapy:  Heparin level 0.3-0.7 units/ml Monitor platelets by anticoagulation protocol: Yes  Date/time aPTT/HL Comment 6/15 0212  HL 0.34 therapeutic x 1 6/15 0957 HL  0.17 Subtherapeutic @ 700 units/hr 6/15 2104 HL 0.46 Therapeutic x 1 @ 900 units/hr   Plan:  Heparin therapeutic x 1 Continue heparin infusion at 900 units/hr Recheck HL 8 hrs to confirm CBC daily while on heparin  Sharen Hones, PharmD, BCPS Clinical Pharmacist   12/07/2021 9:34 PM

## 2021-12-07 NOTE — Progress Notes (Signed)
ANTICOAGULATION CONSULT NOTE   Pharmacy Consult for heparin infusion Indication: chest pain/ACS  Allergies  Allergen Reactions   Tetanus Toxoids Swelling    Tetanus and diphtheria toxids   Penicillin G Rash    Patient Measurements: Weight: 57.4 kg (126 lb 8.7 oz) Heparin Dosing Weight: 57.4 kg   Vital Signs: BP: 113/35 (06/15 0900) Pulse Rate: 55 (06/15 0900)  Labs: Recent Labs    12/06/21 0837 12/06/21 1024 12/06/21 1142 12/06/21 1600 12/07/21 0212 12/07/21 0425 12/07/21 0616  HGB 10.4*  --   --   --   --  9.3*  --   HCT 32.6*  --   --   --   --  29.5*  --   PLT 237  --   --   --   --  216  --   APTT  --   --  22*  --   --   --   --   LABPROT  --   --  13.1  --   --   --   --   INR  --   --  1.0  --   --   --   --   HEPARINUNFRC  --   --   --   --  0.34  --   --   CREATININE 1.46*  --   --   --   --   --  1.51*  TROPONINIHS 55*   < >  --  788*  --  1,593* 1,471*   < > = values in this interval not displayed.     Estimated Creatinine Clearance: 22.9 mL/min (A) (by C-G formula based on SCr of 1.51 mg/dL (H)).   Medical History: Past Medical History:  Diagnosis Date   Diabetes mellitus without complication (HCC)    Hyperlipidemia    Hypertension    Myocardial infarction (HCC)     Medications:  No prior AC noted   Assessment: 81yoF with a PMH of CAD s/p CABG x2 , presented to ED with chest pain. High-sensitivity troponin elevated and uptrending 55>108> 788. Pharmacy has been consulted for initiation and management of heparin infusion  Goal of Therapy:  Heparin level 0.3-0.7 units/ml Monitor platelets by anticoagulation protocol: Yes  Date/time aPTT/HL Comment 6/15 0212  HL 0.34 therapeutic x 1 6/15 0957 HL 0.17 Subtherapeutic @ 700 units/hr   Plan:  Bolus with heparin 1700 units Continue heparin infusion at 900 units/hr Recheck HL 8 hrs after rate change CBC daily while on heparin  Aleesa Sweigert Rodriguez-Guzman PharmD, BCPS 12/07/2021 10:27 AM

## 2021-12-07 NOTE — ED Notes (Signed)
Lab at bedside obtaining hep level

## 2021-12-07 NOTE — ED Notes (Signed)
Echo at bedside

## 2021-12-07 NOTE — Assessment & Plan Note (Signed)
Mgmt as outlined and per Cardiology.

## 2021-12-07 NOTE — Progress Notes (Signed)
Progress Note   Patient: Lindsey Pollard NTI:[id number] DOB: 1940-10-28 DOA: 12/06/2021     0 DOS: the patient was seen and examined on 12/07/2021   Brief hospital course: 81 y.o female with past medical history significant of CAD, s/p of CABG, HTN, HLD, DM, PAD, CHF with EF 45-50%, CKD-3a, RLS, who presented on 12/06/2021 for evaluation of chest pain since earlier that morning.  She took 324 mg ASA and SL NTG with improvement.  In the ED, initial troponin was 55.  Otherwise labs essentially unremarkable.  Vitals were stable, mild bradycardia in upper 50's.  CTA ruled out PE, showed a 4 mm right basilar pulmonary nodule.  No acute ischemic changes on EKG.  Admitted to hospitalist service with cardiology consulted for further evaluation and management.    Troponin was trended, peaked at 1593 then down-trended.  Chest pain attributed to unstable angina.    Treating with 48 hours heparin infusion and medical management.  Assessment and Plan: * Chest pain Chest pain due to Unstable Angina History of CAD: S/p of CABG.   Troponin level 55, 108, 788 >> peaked at 1593. --Mgmt per Cardiology --Heparin x 48 hrs as outlined --Mgmt as outlined   Unstable angina (HCC) Mgmt as outlined and per Cardiology.  NSTEMI (non-ST elevated myocardial infarction) (HCC) See above  CAD (coronary artery disease) Presented with chest pain consistent with unstable angina. Pt has extensive CAD with recent non-STEMI with peak troponin of 3200 in December where left heart cath revealed diffuse disease but patent LIMA to LAD and a small area of 50% ISR in her RCA stent.  She was treated medically with aspirin and Plavix and has been compliant with them since. --See cardiology recommendations --Currently on heparin --Plavix on hold, resume after heparin --On ASA 81 mg, Imdur, Lipitor, Toprol-XL with hold parameters for BP and HR  Chronic kidney disease, stage 3a (HCC) Stable -f/u with BMP  Type II diabetes  mellitus with renal manifestations (HCC) Recent A1c 6.7, well controlled.  Patient is taking Humalog and Lantus 8 units daily -Sliding scale insulin -Glargine insulin 6 units daily  Chronic combined systolic and diastolic CHF (congestive heart failure) (HCC) 2D echo on 06/01/2021 showed EF 45-50% with grade 1 diastolic dysfunction.  Patient does not have leg edema or JVD.  CHF is appears compensated.  BNP 384 -Continue home spironolactone, torsemide  HTN (hypertension) - IV hydralazine as needed -Patient is on torsemide, spironolactone -Metoprolol and Zestoretic  HLD (hyperlipidemia) - Lipitor  Lung nodule This is an incidental findings on CT angiogram -Follow-up with PCP        Subjective: Pt seen in the ED this AM, holding for a bed.  Reports she feels much better today.  No further chest pain.  No SOB or other acute complaints.  Reports chronic numbness in hands and feet bilaterally that she saw PCP for, says this remains unchanged.    Physical Exam: Vitals:   12/07/21 1400 12/07/21 1556 12/07/21 1600 12/07/21 1613  BP:  (!) 133/33  (!) 133/36  Pulse:  61  60  Resp: 14 16 13 13   Temp:  98.6 F (37 C)    TempSrc:  Oral    SpO2:  98%    Weight:      Height:       General exam: awake, alert, no acute distress HEENT: moist mucus membranes, hearing grossly normal  Respiratory system: CTAB diminished bases, no wheezes, rales or rhonchi, normal respiratory effort at rest. Cardiovascular system:  normal S1/S2, RRR, no JVD, murmurs, rubs, gallops, no pedal edema.   Central nervous system: A&O x3. no gross focal neurologic deficits, normal speech Extremities: moves all, no edema, normal tone Skin: dry, intact, normal temperature Psychiatry: normal mood, congruent affect, judgement and insight appear normal   Data Reviewed:  Notable labs -  BUN 36, Cr 1.51, Ca 8.6, GFR 35, troponin trended down 1593 >> 14721, lipid profile normal except HDL 31 low, Hbg 9.3.    Echo -  12/07/2021 IMPRESSIONS   1. Left ventricular ejection fraction, by estimation, is 55 to 60%. The  left ventricle has normal function. The left ventricle has no regional  wall motion abnormalities. Left ventricular diastolic parameters are  consistent with Grade I diastolic dysfunction (impaired relaxation).   2. Right ventricular systolic function is normal. The right ventricular  size is normal.   3. The mitral valve is normal in structure. Moderate mitral valve  regurgitation.   4. The aortic valve is normal in structure. Aortic valve regurgitation is  mild.     Family Communication: none, will attempt to call    Disposition: Status is: Inpatient Remains inpatient appropriate because: remains on IV heparin per cardiology.  Possible d/c tomorrow if off IV therapies, continued stability and clearance by cardiology   Planned Discharge Destination: Home    Time spent: 40 minutes  Author: Pennie Banter, DO 12/07/2021 7:23 PM  For on call review [email or web address].

## 2021-12-07 NOTE — Progress Notes (Signed)
*  PRELIMINARY RESULTS* Echocardiogram 2D Echocardiogram has been performed.  Lindsey Pollard 12/07/2021, 7:52 AM

## 2021-12-07 NOTE — ED Notes (Signed)
Pt resting at this time. NAD noted. Breakfast tray given. Pt assisted to bedside toilet. Pt denies any further needs at this time. Call bell in reach.  Pt denies any chest pain at this time, pt educated to let this RN know if she does, pt verbalized understanding.

## 2021-12-08 DIAGNOSIS — I200 Unstable angina: Secondary | ICD-10-CM | POA: Diagnosis not present

## 2021-12-08 DIAGNOSIS — I5042 Chronic combined systolic (congestive) and diastolic (congestive) heart failure: Secondary | ICD-10-CM | POA: Diagnosis not present

## 2021-12-08 DIAGNOSIS — I25118 Atherosclerotic heart disease of native coronary artery with other forms of angina pectoris: Secondary | ICD-10-CM

## 2021-12-08 LAB — GLUCOSE, CAPILLARY
Glucose-Capillary: 126 mg/dL — ABNORMAL HIGH (ref 70–99)
Glucose-Capillary: 166 mg/dL — ABNORMAL HIGH (ref 70–99)

## 2021-12-08 LAB — CBC
HCT: 32.4 % — ABNORMAL LOW (ref 36.0–46.0)
Hemoglobin: 10.6 g/dL — ABNORMAL LOW (ref 12.0–15.0)
MCH: 30.2 pg (ref 26.0–34.0)
MCHC: 32.7 g/dL (ref 30.0–36.0)
MCV: 92.3 fL (ref 80.0–100.0)
Platelets: 228 10*3/uL (ref 150–400)
RBC: 3.51 MIL/uL — ABNORMAL LOW (ref 3.87–5.11)
RDW: 14.2 % (ref 11.5–15.5)
WBC: 7.9 10*3/uL (ref 4.0–10.5)
nRBC: 0 % (ref 0.0–0.2)

## 2021-12-08 LAB — BASIC METABOLIC PANEL
Anion gap: 7 (ref 5–15)
BUN: 35 mg/dL — ABNORMAL HIGH (ref 8–23)
CO2: 27 mmol/L (ref 22–32)
Calcium: 8.9 mg/dL (ref 8.9–10.3)
Chloride: 103 mmol/L (ref 98–111)
Creatinine, Ser: 1.39 mg/dL — ABNORMAL HIGH (ref 0.44–1.00)
GFR, Estimated: 38 mL/min — ABNORMAL LOW (ref >60)
Glucose, Bld: 123 mg/dL — ABNORMAL HIGH (ref 70–99)
Potassium: 4.1 mmol/L (ref 3.5–5.1)
Sodium: 137 mmol/L (ref 135–145)

## 2021-12-08 LAB — HEPARIN LEVEL (UNFRACTIONATED): Heparin Unfractionated: 0.64 IU/mL (ref 0.30–0.70)

## 2021-12-08 MED ORDER — LISINOPRIL 20 MG PO TABS: 20.0000 mg | ORAL_TABLET | Freq: Every day | ORAL | 1 refills | Status: DC

## 2021-12-08 MED ORDER — CLOPIDOGREL BISULFATE 75 MG PO TABS: 75.0000 mg | ORAL_TABLET | Freq: Every day | ORAL | Status: DC

## 2021-12-08 MED ORDER — ISOSORBIDE MONONITRATE ER 30 MG PO TB24: 30.0000 mg | ORAL_TABLET | Freq: Every day | ORAL | 1 refills | Status: DC

## 2021-12-08 NOTE — Progress Notes (Signed)
ANTICOAGULATION CONSULT NOTE   Pharmacy Consult for heparin infusion Indication: chest pain/ACS  Allergies  Allergen Reactions   Tetanus Toxoids Swelling    Tetanus and diphtheria toxids   Penicillin G Rash    Patient Measurements: Height: 4' 11.75" (151.8 cm) Weight: 57.1 kg (125 lb 12.8 oz) IBW/kg (Calculated) : 44.93 Heparin Dosing Weight: 57.4 kg   Vital Signs: Temp: 97.8 F (36.6 C) (06/16 0435) Temp Source: Oral (06/15 1930) BP: 136/45 (06/16 0621) Pulse Rate: 68 (06/16 0621)  Labs: Recent Labs    12/06/21 0837 12/06/21 1024 12/06/21 1142 12/06/21 1600 12/07/21 0212 12/07/21 0425 12/07/21 0616 12/07/21 0957 12/07/21 2104 12/08/21 0540  HGB 10.4*  --   --   --   --  9.3*  --   --   --  10.6*  HCT 32.6*  --   --   --   --  29.5*  --   --   --  32.4*  PLT 237  --   --   --   --  216  --   --   --  228  APTT  --   --  22*  --   --   --   --   --   --   --   LABPROT  --   --  13.1  --   --   --   --   --   --   --   INR  --   --  1.0  --   --   --   --   --   --   --   HEPARINUNFRC  --   --   --   --    < >  --   --  0.17* 0.46 0.64  CREATININE 1.46*  --   --   --   --   --  1.51*  --   --  1.39*  TROPONINIHS 55*   < >  --  788*  --  1,593* 1,471*  --   --   --    < > = values in this interval not displayed.     Estimated Creatinine Clearance: 25.4 mL/min (A) (by C-G formula based on SCr of 1.39 mg/dL (H)).   Medical History: Past Medical History:  Diagnosis Date   Diabetes mellitus without complication (HCC)    Hyperlipidemia    Hypertension    Myocardial infarction (HCC)     Medications:  No prior AC noted   Assessment: 81yoF with a PMH of CAD s/p CABG x2 , presented to ED with chest pain. High-sensitivity troponin elevated and uptrending 55>108> 788. Pharmacy has been consulted for initiation and management of heparin infusion  Goal of Therapy:  Heparin level 0.3-0.7 units/ml Monitor platelets by anticoagulation protocol:  Yes  Date/time aPTT/HL Comment 6/15 0212  HL 0.34 therapeutic x 1 6/15 0957 HL 0.17 Subtherapeutic @ 700 units/hr 6/15 2104 HL 0.46 Therapeutic x 1 @ 900 units/hr 6/16 0540 HL 0.64 Therapeutic x 2   Plan:  Heparin therapeutic x 1 Continue heparin infusion at 900 units/hr Recheck HL daily w/ AM labs while therapeutic CBC daily while on heparin  Otelia Sergeant, PharmD, [facility] 12/08/2021 6:31 AM

## 2021-12-08 NOTE — Progress Notes (Signed)
Pt c/o right sided chest pain 5/10 after coughing. Nitro given x2 with positive effectiveness. Pain currently 0. See flow for timeline.   12/08/21 0608 12/08/21 0609  Vitals  BP  --  (!) 187/51  MAP (mmHg)  --  91  BP Location  --  Left Arm  BP Method  --  Automatic  Patient Position (if appropriate)  --  Lying  Pulse Rate  --  74  Pulse Rate Source  --  Monitor  Resp  --  18  Level of Consciousness  Level of Consciousness  --  Alert  Oxygen Therapy  SpO2  --  94 %  O2 Device  --  Room Air  Pain Assessment  Pain Scale 0-10  --   Pain Score 5  --   Pain Type Acute pain  --   Pain Location Chest  --   Pain Orientation Right  --   Pain Descriptors / Indicators Pressure  --   Pain Onset Sudden (post cough)  --   Pain Intervention(s) Medication (See eMAR)  --   MEWS Score  MEWS Temp  --  0  MEWS Systolic  --  0  MEWS Pulse  --  0  MEWS RR  --  0  MEWS LOC  --  0  MEWS Score  --  0  MEWS Score Color  --  Chilton Si

## 2021-12-08 NOTE — Discharge Summary (Signed)
Physician Discharge Summary   Patient: Lindsey Pollard MRN: [id number] DOB: 1941/01/23  Admit date:     12/06/2021  Discharge date: 12/10/21  Discharge Physician: Pennie Banter   PCP: Lauro Regulus, MD   Recommendations at discharge:    Follow up with Cardiology in 1-2 weeks Repeat BMP, Mg, CBC in 1-2 weeks Follow up with Primary Care in 1-2 weeks  Discharge Diagnoses: Active Problems:   CAD (coronary artery disease)   Unstable angina (HCC)   Chronic kidney disease, stage 3a (HCC)   Type II diabetes mellitus with renal manifestations (HCC)   Chronic combined systolic and diastolic CHF (congestive heart failure) (HCC)   HTN (hypertension)   HLD (hyperlipidemia)   Lung nodule  Principal Problem (Resolved):   Chest pain  Hospital Course: 81 y.o female with past medical history significant of CAD, s/p of CABG, HTN, HLD, DM, PAD, CHF with EF 45-50%, CKD-3a, RLS, who presented on 12/06/2021 for evaluation of chest pain since earlier that morning.  She took 324 mg ASA and SL NTG with improvement.  In the ED, initial troponin was 55.  Otherwise labs essentially unremarkable.  Vitals were stable, mild bradycardia in upper 50's.  CTA ruled out PE, showed a 4 mm right basilar pulmonary nodule.  No acute ischemic changes on EKG.  Admitted to hospitalist service with cardiology consulted for further evaluation and management.    Troponin was trended, peaked at 1593 then down-trended.  Chest pain attributed to unstable angina.    Treated with 48 hours heparin infusion and medical management.  Assessment and Plan: * Chest pain-resolved as of 12/10/2021 Chest pain due to Unstable Angina History of CAD: S/p of CABG.   Troponin level 55, 108, 788 >> peaked at 1593. --Mgmt per Cardiology --Heparin x 48 hrs as outlined --Mgmt as outlined   Unstable angina (HCC) Mgmt as outlined and per Cardiology.  NSTEMI (non-ST elevated myocardial infarction) (HCC) See above  CAD (coronary  artery disease) Presented with chest pain consistent with unstable angina. Pt has extensive CAD with recent non-STEMI with peak troponin of 3200 in December where left heart cath revealed diffuse disease but patent LIMA to LAD and a small area of 50% ISR in her RCA stent.  She was treated medically with aspirin and Plavix and has been compliant with them since. --See cardiology recommendations -- Treated with heparin infusion for 48 hours --Plavix held during heparin infusion, resumed --On ASA 81 mg, Imdur, Lipitor, Toprol-XL with hold parameters for BP and HR  Chronic kidney disease, stage 3a (HCC) Stable -f/u with BMP  Type II diabetes mellitus with renal manifestations (HCC) Recent A1c 6.7, well controlled.  Patient is taking Humalog and Lantus 8 units daily -Sliding scale insulin -Glargine insulin 6 units daily  Chronic combined systolic and diastolic CHF (congestive heart failure) (HCC) 2D echo on 06/01/2021 showed EF 45-50% with grade 1 diastolic dysfunction.  Patient does not have leg edema or JVD.  CHF is appears compensated.  BNP 384 -Continue home spironolactone, torsemide  HTN (hypertension) - IV hydralazine as needed -Patient is on torsemide, spironolactone -Metoprolol and Zestoretic  HLD (hyperlipidemia) - Lipitor  Lung nodule This is an incidental findings on CT angiogram -Follow-up with PCP         Consultants: Cardiology Procedures performed: Echocardiogram 6/15 Disposition: Home Diet recommendation:  Discharge Diet Orders (From admission, onward)     Start     Ordered   12/08/21 0000  Diet - low sodium heart healthy  12/08/21 1437           Cardiac and Carb modified diet DISCHARGE MEDICATION: Allergies as of 12/08/2021       Reactions   Tetanus Toxoids Swelling   Tetanus and diphtheria toxids   Penicillin G Rash        Medication List     STOP taking these medications    lisinopril-hydrochlorothiazide 20-12.5 MG  tablet Commonly known as: ZESTORETIC   terbinafine 1 % cream Commonly known as: LAMISIL       TAKE these medications    acetaminophen 325 MG tablet Commonly known as: TYLENOL Take 650 mg by mouth at bedtime.   albuterol 108 (90 Base) MCG/ACT inhaler Commonly known as: VENTOLIN HFA Inhale 1-2 puffs into the lungs every 4 (four) hours as needed for shortness of breath.   aspirin EC 81 MG tablet Take by mouth.   atorvastatin 80 MG tablet Commonly known as: LIPITOR Take 80 mg by mouth every evening.   baclofen 10 MG tablet Commonly known as: LIORESAL Take 10 mg by mouth 3 (three) times daily.   Cholecalciferol 25 MCG (1000 UT) capsule Take 1,000 Units by mouth 2 (two) times daily.   clopidogrel 75 MG tablet Commonly known as: PLAVIX Take 1 tablet (75 mg total) by mouth daily.   insulin glargine 100 UNIT/ML injection Commonly known as: LANTUS Inject 8 Units into the skin at bedtime.   insulin lispro 100 UNIT/ML KwikPen Commonly known as: HUMALOG Inject 0-2 Units into the skin 3 (three) times daily.   ipratropium-albuterol 0.5-2.5 (3) MG/3ML Soln Commonly known as: DUONEB Take 3 mLs by nebulization every 6 (six) hours as needed (shortness of breath).   isosorbide mononitrate 30 MG 24 hr tablet Commonly known as: IMDUR Take 1 tablet (30 mg total) by mouth daily. What changed:  medication strength how much to take   lisinopril 20 MG tablet Commonly known as: ZESTRIL Take 1 tablet (20 mg total) by mouth daily. What changed:  medication strength how much to take   metoprolol succinate 50 MG 24 hr tablet Commonly known as: TOPROL-XL Take 50 mg by mouth daily.   multivitamin with minerals Tabs tablet Take 1 tablet by mouth daily.   nitroGLYCERIN 0.4 MG SL tablet Commonly known as: NITROSTAT Place 0.4 mg under the tongue every 5 (five) minutes as needed for chest pain.   nystatin powder Commonly known as: MYCOSTATIN/NYSTOP Apply three times a day under  skin folds in groin What changed:  how much to take how to take this when to take this reasons to take this additional instructions   ondansetron 4 MG disintegrating tablet Commonly known as: ZOFRAN-ODT Take 4 mg by mouth every 8 (eight) hours as needed.   senna 8.6 MG tablet Commonly known as: SENOKOT Take 2 tablets by mouth at bedtime.   Spiriva HandiHaler 18 MCG inhalation capsule Generic drug: tiotropium Place 1 capsule into inhaler and inhale daily as needed for shortness of breath.   spironolactone 25 MG tablet Commonly known as: ALDACTONE Take 12.5 mg by mouth daily.   torsemide 20 MG tablet Commonly known as: DEMADEX Take 20 mg by mouth daily.        Follow-up Information     Yolonda Kida, MD. Go in 1 week(s).   Specialties: Cardiology, Internal Medicine Contact information: [place] [address] [phone]                Discharge Exam: Danley Danker Weights   12/06/21  [id number] 12/07/21 1320 12/08/21 0435  Weight: 57.4 kg 58.2 kg 57.1 kg   General exam: awake, alert, no acute distress HEENT: atraumatic, clear conjunctiva, anicteric sclera, moist mucus membranes, hearing grossly normal  Respiratory system: CTAB, no wheezes, rales or rhonchi, normal respiratory effort. Cardiovascular system: normal S1/S2, RRR, no pedal edema.   Gastrointestinal system: soft, NT, ND, no HSM felt, +bowel sounds. Central nervous system: A&O x3. no gross focal neurologic deficits, normal speech Extremities: moves all , no edema, normal tone Psychiatry: normal mood, congruent affect, judgement and insight appear normal   Condition at discharge: stable  The results of significant diagnostics from this hospitalization (including imaging, microbiology, ancillary and laboratory) are listed below for reference.   Imaging Studies: ECHOCARDIOGRAM COMPLETE  Result Date: 12/07/2021    ECHOCARDIOGRAM REPORT   Patient Name:   Lindsey Pollard [facility] Date of Exam:  12/07/2021 Medical Rec #:  [id number]     Height:       59.0 in Accession #:    [id number]    Weight:       126.5 lb Date of Birth:  Sep 06, 1940     BSA:          1.518 m Patient Age:    33 years      BP:           123/32 mmHg Patient Gender: F             HR:           56 bpm. Exam Location:  [facility] Procedure: 2D Echo, Cardiac Doppler and Color Doppler Indications:     Chest pain R07.9  History:         Patient has prior history of Echocardiogram examinations, most                  recent 06/01/2021. Previous Myocardial Infarction, Prior CABG;                  Risk Factors:Hypertension, Dyslipidemia and Diabetes.  Sonographer:     Sherrie Sport Referring Phys:  [id number] Sheridan TANG Diagnosing Phys: Yolonda Kida MD IMPRESSIONS  1. Left ventricular ejection fraction, by estimation, is 55 to 60%. The left ventricle has normal function. The left ventricle has no regional wall motion abnormalities. Left ventricular diastolic parameters are consistent with Grade I diastolic dysfunction (impaired relaxation).  2. Right ventricular systolic function is normal. The right ventricular size is normal.  3. The mitral valve is normal in structure. Moderate mitral valve regurgitation.  4. The aortic valve is normal in structure. Aortic valve regurgitation is mild. FINDINGS  Left Ventricle: Left ventricular ejection fraction, by estimation, is 55 to 60%. The left ventricle has normal function. The left ventricle has no regional wall motion abnormalities. The left ventricular internal cavity size was normal in size. There is  no left ventricular hypertrophy. Left ventricular diastolic parameters are consistent with Grade I diastolic dysfunction (impaired relaxation). Right Ventricle: The right ventricular size is normal. No increase in right ventricular wall thickness. Right ventricular systolic function is normal. Left Atrium: Left atrial size was normal in size. Right Atrium: Right atrial size was normal in size. Pericardium:  There is no evidence of pericardial effusion. Mitral Valve: The mitral valve is normal in structure. Moderate mitral valve regurgitation. MV peak gradient, 4.8 mmHg. The mean mitral valve gradient is 1.0 mmHg. Tricuspid Valve: The tricuspid valve is grossly normal. Tricuspid valve regurgitation is mild. Aortic Valve: The aortic valve is  normal in structure. Aortic valve regurgitation is mild. Aortic regurgitation PHT measures 567 msec. Aortic valve mean gradient measures 10.0 mmHg. Aortic valve peak gradient measures 16.9 mmHg. Aortic valve area, by VTI  measures 1.12 cm. Pulmonic Valve: The pulmonic valve was normal in structure. Pulmonic valve regurgitation is not visualized. Aorta: The ascending aorta was not well visualized. IAS/Shunts: No atrial level shunt detected by color flow Doppler.  LEFT VENTRICLE PLAX 2D LVIDd:         4.40 cm   Diastology LVIDs:         3.10 cm   LV e' medial:    6.96 cm/s LV PW:         1.30 cm   LV E/e' medial:  11.2 LV IVS:        0.75 cm   LV e' lateral:   7.40 cm/s LVOT diam:     2.00 cm   LV E/e' lateral: 10.5 LV SV:         60 LV SV Index:   39 LVOT Area:     3.14 cm  RIGHT VENTRICLE RV Basal diam:  3.50 cm RV S prime:     10.60 cm/s TAPSE (M-mode): 1.5 cm LEFT ATRIUM             Index        RIGHT ATRIUM           Index LA diam:        3.20 cm 2.11 cm/m   RA Area:     12.70 cm LA Vol (A2C):   63.1 ml 41.56 ml/m  RA Volume:   29.50 ml  19.43 ml/m LA Vol (A4C):   46.0 ml 30.30 ml/m LA Biplane Vol: 53.3 ml 35.10 ml/m  AORTIC VALVE                     PULMONIC VALVE AV Area (Vmax):    1.15 cm      PV Vmax:        0.88 m/s AV Area (Vmean):   0.99 cm      PV Vmean:       57.400 cm/s AV Area (VTI):     1.12 cm      PV VTI:         0.194 m AV Vmax:           205.33 cm/s   PV Peak grad:   3.1 mmHg AV Vmean:          148.333 cm/s  PV Mean grad:   2.0 mmHg AV VTI:            0.532 m       RVOT Peak grad: 4 mmHg AV Peak Grad:      16.9 mmHg AV Mean Grad:      10.0 mmHg LVOT  Vmax:         75.10 cm/s LVOT Vmean:        46.600 cm/s LVOT VTI:          0.190 m LVOT/AV VTI ratio: 0.36 AI PHT:            567 msec  AORTA Ao Root diam: 2.80 cm MITRAL VALVE                TRICUSPID VALVE MV Area (PHT): 2.26 cm     TR Peak grad:   38.2 mmHg MV Area VTI:   1.74 cm     TR  Vmax:        309.00 cm/s MV Peak grad:  4.8 mmHg MV Mean grad:  1.0 mmHg     SHUNTS MV Vmax:       1.09 m/s     Systemic VTI:  0.19 m MV Vmean:      47.4 cm/s    Systemic Diam: 2.00 cm MV Decel Time: 335 msec     Pulmonic VTI:  0.220 m MV E velocity: 78.00 cm/s MV A velocity: 105.00 cm/s MV E/A ratio:  0.74 Dwayne D Callwood MD Electronically signed by Yolonda Kida MD Signature Date/Time: 12/07/2021/1:24:31 PM    Final    CT Angio Chest PE W/Cm &/Or Wo Cm  Result Date: 12/06/2021 CLINICAL DATA:  Pulmonary embolism (PE) suspected, high prob EXAM: CT ANGIOGRAPHY CHEST WITH CONTRAST TECHNIQUE: Multidetector CT imaging of the chest was performed using the standard protocol during bolus administration of intravenous contrast. Multiplanar CT image reconstructions and MIPs were obtained to evaluate the vascular anatomy. RADIATION DOSE REDUCTION: This exam was performed according to the departmental dose-optimization program which includes automated exposure control, adjustment of the mA and/or kV according to patient size and/or use of iterative reconstruction technique. CONTRAST:  64mL OMNIPAQUE IOHEXOL 350 MG/ML SOLN COMPARISON:  None Available. FINDINGS: Cardiovascular: Satisfactory opacification of the pulmonary arteries to the segmental level. No evidence of pulmonary embolism. Mild cardiomegaly. No pericardial disease. Prior CABG. Extensive native coronary artery calcification. Reflux of contrast into the IVC and hepatic veins. Moderate atherosclerosis of the thoracic aorta. Mediastinum/Nodes: Mildly enlarged precarinal lymph node measuring 1.1 cm, likely reactive. No axillary adenopathy. No hilar adenopathy. The thyroid  is unremarkable. Small hiatal hernia. Lungs/Pleura: There is interlobular septal thickening and ground-glass opacities bilaterally. No airspace consolidation. There is a 4 mm pulmonary nodule in the right lung base (series 5, image 94). Upper Abdomen: No acute abnormality. Musculoskeletal: No chest wall abnormality. No acute or significant osseous findings. Review of the MIP images confirms the above findings. IMPRESSION: No evidence of pulmonary embolism.  Mild pulmonary edema. 4 mm pulmonary nodule in the right lung base (series 5, image 94). No routine follow-up imaging is recommended per Fleischner guidelines. These guidelines do not apply to immunocompromised patients and patients with cancer. Follow up in patients with significant comorbidities as clinically warranted. Reference: Radiology. 2017; 284(1):228-43. Electronically Signed   By: Maurine Simmering M.D.   On: 12/06/2021 09:54   DG Chest 2 View  Result Date: 12/06/2021 CLINICAL DATA:  81 year old female with history of chest pain. EXAM: CHEST - 2 VIEW COMPARISON:  Chest x-ray 06/01/2021. FINDINGS: There is cephalization of the pulmonary vasculature and slight indistinctness of the interstitial markings suggestive of mild pulmonary edema. No pleural effusions. Mild cardiomegaly. Upper mediastinal contours are within normal limits. Atherosclerotic calcifications are noted in the thoracic aorta. Status post median sternotomy. IMPRESSION: 1. The appearance the chest suggests mild congestive heart failure, as above. 2. Aortic atherosclerosis. Electronically Signed   By: Vinnie Langton M.D.   On: 12/06/2021 09:04    Microbiology: Results for orders placed or performed during the hospital encounter of 06/01/21  Resp Panel by RT-PCR (Flu A&B, Covid) Nasopharyngeal Swab     Status: None   Collection Time: 06/01/21  5:50 AM   Specimen: Nasopharyngeal Swab; Nasopharyngeal(NP) swabs in vial transport medium  Result Value Ref Range Status   SARS Coronavirus  2 by RT PCR NEGATIVE NEGATIVE Final    Comment: (NOTE) SARS-CoV-2 target nucleic acids are NOT DETECTED.  The  SARS-CoV-2 RNA is generally detectable in upper respiratory specimens during the acute phase of infection. The lowest concentration of SARS-CoV-2 viral copies this assay can detect is 138 copies/mL. A negative result does not preclude SARS-Cov-2 infection and should not be used as the sole basis for treatment or other patient management decisions. A negative result may occur with  improper specimen collection/handling, submission of specimen other than nasopharyngeal swab, presence of viral mutation(s) within the areas targeted by this assay, and inadequate number of viral copies(<138 copies/mL). A negative result must be combined with clinical observations, patient history, and epidemiological information. The expected result is Negative.  Fact Sheet for Patients:  EntrepreneurPulse.com.au  Fact Sheet for Healthcare Providers:  IncredibleEmployment.be  This test is no t yet approved or cleared by the [place] FDA and  has been authorized for detection and/or diagnosis of SARS-CoV-2 by FDA under an Emergency Use Authorization (EUA). This EUA will remain  in effect (meaning this test can be used) for the duration of the COVID-19 declaration under Section 564(b)(1) of the Act, 21 U.S.C.section 360bbb-3(b)(1), unless the authorization is terminated  or revoked sooner.       Influenza A by PCR NEGATIVE NEGATIVE Final   Influenza B by PCR NEGATIVE NEGATIVE Final    Comment: (NOTE) The Xpert Xpress SARS-CoV-2/FLU/RSV plus assay is intended as an aid in the diagnosis of influenza from Nasopharyngeal swab specimens and should not be used as a sole basis for treatment. Nasal washings and aspirates are unacceptable for Xpert Xpress SARS-CoV-2/FLU/RSV testing.  Fact Sheet for Patients: EntrepreneurPulse.com.au  Fact  Sheet for Healthcare Providers: IncredibleEmployment.be  This test is not yet approved or cleared by the [place] FDA and has been authorized for detection and/or diagnosis of SARS-CoV-2 by FDA under an Emergency Use Authorization (EUA). This EUA will remain in effect (meaning this test can be used) for the duration of the COVID-19 declaration under Section 564(b)(1) of the Act, 21 U.S.C. section 360bbb-3(b)(1), unless the authorization is terminated or revoked.  Performed at [facility], [address] [place], [place] [address]   MRSA Next Gen by PCR, Nasal     Status: None   Collection Time: 06/01/21  8:40 AM   Specimen: Nasal Mucosa; Nasal Swab  Result Value Ref Range Status   MRSA by PCR Next Gen NOT DETECTED NOT DETECTED Final    Comment: (NOTE) The GeneXpert MRSA Assay (FDA approved for NASAL specimens only), is one component of a comprehensive MRSA colonization surveillance program. It is not intended to diagnose MRSA infection nor to guide or monitor treatment for MRSA infections. Test performance is not FDA approved in patients less than 14 years old. Performed at [facility], [address] [place], [place] [address]     Labs: CBC: Recent Labs  Lab 12/06/21 0837 12/07/21 0425 12/08/21 0540  WBC 8.6 8.6 7.9  NEUTROABS 5.9  --   --   HGB 10.4* 9.3* 10.6*  HCT 32.6* 29.5* 32.4*  MCV 96.4 94.9 92.3  PLT 237 216 [id number]   Basic Metabolic Panel: Recent Labs  Lab 12/06/21 0837 12/07/21 0616 12/08/21 0540  NA 135 136 137  K 4.5 4.4 4.1  CL 103 102 103  CO2 24 29 27   GLUCOSE 185* 97 123*  BUN 37* 36* 35*  CREATININE 1.46* 1.51* 1.39*  CALCIUM 8.7* 8.6* 8.9   Liver Function Tests: No results for input(s): "AST", "ALT", "ALKPHOS", "BILITOT", "PROT", "ALBUMIN" in the last 168 hours. CBG: Recent Labs  Lab 12/07/21 1333 12/07/21 1616 12/07/21 2110 12/08/21 0844 12/08/21 1128  GLUCAP 171* 120* 128* 126* 166*     Discharge time spent: less than 30 minutes.  Signed: Pennie Banter, DO [facility] 12/10/2021

## 2021-12-08 NOTE — Progress Notes (Signed)
[place] NOTE       Patient ID: Lindsey Pollard MRN: [id number] DOB/AGE: [birth year] 81 y.o.  Admit date: 12/06/2021 Referring Physician Dr. Ivor Costa Primary Physician Dr. [name] Primary Cardiologist Dr. 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  Cardiology is consulted for further assistance.  Interval History: -reported R sided chest discomfort with a coughing spell resolved s/p SL nitro, denies exertional chest / shoulder discomfort with ambulation in the room  -no SOB or other complaints, eager to go home -echo resulted with preserved EF g1dd, no WMA  Review of systems complete and found to be negative unless listed above     Past Medical History:  Diagnosis Date   Diabetes mellitus without complication ([place])    Hyperlipidemia    Hypertension    Myocardial infarction [facility])     Past Surgical History:  Procedure Laterality Date   CARDIAC SURGERY     bypass   CORONARY/GRAFT ACUTE MI REVASCULARIZATION N/A 06/01/2021   Procedure: Coronary/Graft Acute MI Revascularization;  Surgeon: Yolonda Kida, MD;  Location: [place] [facility];  Service: Cardiovascular;  Laterality: N/A;   EYE SURGERY     cataract extraction   LEFT HEART CATH AND CORONARY ANGIOGRAPHY N/A 06/01/2021   Procedure: LEFT HEART CATH AND CORONARY ANGIOGRAPHY;  Surgeon: Yolonda Kida, MD;  Location: [place] [facility];  Service: Cardiovascular;  Laterality: N/A;    Medications Prior to Admission  Medication Sig Dispense Refill Last Dose   acetaminophen (TYLENOL) 325 MG tablet Take 650 mg by mouth at bedtime.   Past Week   albuterol (VENTOLIN  HFA) 108 (90 Base) MCG/ACT inhaler Inhale 1-2 puffs into the lungs every 4 (four) hours as needed for shortness of breath.   Past Week   aspirin 81 MG EC tablet Take by mouth.   12/05/2021   atorvastatin (LIPITOR) 80 MG tablet Take 80 mg by mouth every evening.   12/05/2021   baclofen (LIORESAL) 10 MG tablet Take 10 mg by mouth 3 (three) times daily.   12/05/2021   Cholecalciferol 25 MCG (1000 UT) capsule Take 1,000 Units by mouth 2 (two) times daily.   12/05/2021   insulin glargine (LANTUS) 100 UNIT/ML injection Inject 8 Units into the skin at bedtime.   12/05/2021   insulin lispro (HUMALOG) 100 UNIT/ML KwikPen Inject 0-2 Units into the skin 3 (three) times daily.   12/05/2021   ipratropium-albuterol (DUONEB) 0.5-2.5 (3) MG/3ML SOLN Take 3 mLs by nebulization every 6 (six) hours as needed (shortness of breath).   Past Week   lisinopril-hydrochlorothiazide (ZESTORETIC) 20-12.5 MG tablet Take 1 tablet by mouth daily.   12/05/2021   metoprolol succinate (TOPROL-XL) 50 MG 24 hr tablet Take 50 mg by mouth daily.   12/05/2021   Multiple Vitamin (MULTIVITAMIN WITH MINERALS) TABS tablet Take 1 tablet by mouth daily.   12/05/2021   ondansetron (ZOFRAN-ODT) 4 MG disintegrating tablet Take 4 mg by mouth every 8 (eight) hours as needed.   12/05/2021   senna (SENOKOT) 8.6 MG tablet Take 2 tablets by mouth at bedtime.   12/05/2021   SPIRIVA HANDIHALER  18 MCG inhalation capsule Place 1 capsule into inhaler and inhale daily as needed for shortness of breath.   Past Month   spironolactone (ALDACTONE) 25 MG tablet Take 12.5 mg by mouth daily.   12/05/2021   torsemide (DEMADEX) 20 MG tablet Take 20 mg by mouth daily.   12/05/2021   clopidogrel (PLAVIX) 75 MG tablet Take 1 tablet (75 mg total) by mouth daily. (Patient not taking: Reported on 10/03/2021) 30 tablet 0    isosorbide mononitrate (IMDUR) 60 MG 24 hr tablet Take 1 tablet (60 mg total) by mouth daily. (Patient not taking: Reported on 12/06/2021) 30 tablet 0 Not Taking    lisinopril (ZESTRIL) 5 MG tablet Take 5 mg by mouth daily. (Patient not taking: Reported on 12/06/2021)   Not Taking   nitroGLYCERIN (NITROSTAT) 0.4 MG SL tablet Place 0.4 mg under the tongue every 5 (five) minutes as needed for chest pain.   prn   nystatin (MYCOSTATIN/NYSTOP) powder Apply three times a day under skin folds in groin (Patient taking differently: Apply 1 application. topically 3 (three) times daily as needed (under skin folds in groin).) 60 g 0    terbinafine (LAMISIL) 1 % cream Apply twice a day to heels (Patient not taking: Reported on 12/06/2021) 15 g 0 Not Taking    Social History   Socioeconomic History   Marital status: Divorced    Spouse name: Not on file   Number of children: Not on file   Years of education: Not on file   Highest education level: Not on file  Occupational History   Not on file  Tobacco Use   Smoking status: Former    Types: Cigarettes   Smokeless tobacco: Never  Substance and Sexual Activity   Alcohol use: Not Currently   Drug use: Not Currently   Sexual activity: Not on file  Other Topics Concern   Not on file  Social History Narrative   Not on file   Social Determinants of Health   Financial Resource Strain: Not on file  Food Insecurity: Not on file  Transportation Needs: Not on file  Physical Activity: Not on file  Stress: Not on file  Social Connections: Not on file  Intimate Partner Violence: Not on file    Family History  Problem Relation Age of Onset   Congestive Heart Failure Mother    Diabetes Father    Breast cancer Neg Hx       PHYSICAL EXAM General: Elderly and frail Caucasian female, in no acute distress.  Sitting upright on side of ED stretcher HEENT:  Normocephalic and atraumatic.  Somewhat hard of hearing but reads lips well. Neck:  No JVD.  Lungs: Normal respiratory effort on room air.  Clear to auscultation bilaterally Heart: Regular rate and rhythm, 3/6 systolic murmur best heard at RUSB.  S1-S2  present Abdomen: Obese appearing.  Msk: Normal strength and tone for age. Extremities: Warm and well perfused. No clubbing, cyanosis.  No peripheral edema. R wrist in brace for her carpal tunnel  Neuro: Alert and oriented X 3. Psych:  Answers questions appropriately.   Labs:   Lab Results  Component Value Date   WBC 7.9 12/08/2021   HGB 10.6 (L) 12/08/2021   HCT 32.4 (L) 12/08/2021   MCV 92.3 12/08/2021   PLT 228 12/08/2021    Recent Labs  Lab 12/08/21 0540  NA 137  K 4.1  CL 103  CO2 27  BUN 35*  CREATININE 1.39*  CALCIUM 8.9  GLUCOSE  123*    No results found for: "CKTOTAL", "CKMB", "CKMBINDEX", "TROPONINI"  Lab Results  Component Value Date   CHOL 102 12/07/2021   CHOL 127 06/01/2021   Lab Results  Component Value Date   HDL 31 (L) 12/07/2021   HDL 47 06/01/2021   Lab Results  Component Value Date   LDLCALC 45 12/07/2021   LDLCALC 49 06/01/2021   Lab Results  Component Value Date   TRIG 128 12/07/2021   TRIG 154 (H) 06/01/2021   Lab Results  Component Value Date   CHOLHDL 3.3 12/07/2021   CHOLHDL 2.7 06/01/2021   No results found for: "LDLDIRECT"    Radiology: ECHOCARDIOGRAM COMPLETE  Result Date: 12/07/2021    ECHOCARDIOGRAM REPORT   Patient Name:   Lindsey Pollard [facility] Date of Exam: 12/07/2021 Medical Rec #:  [id number]     Height:       59.0 in Accession #:    [id number]    Weight:       126.5 lb Date of Birth:  1940/12/28     BSA:          1.518 m Patient Age:    14 years      BP:           123/32 mmHg Patient Gender: F             HR:           56 bpm. Exam Location:  [facility] Procedure: 2D Echo, Cardiac Doppler and Color Doppler Indications:     Chest pain R07.9  History:         Patient has prior history of Echocardiogram examinations, most                  recent 06/01/2021. Previous Myocardial Infarction, Prior CABG;                  Risk Factors:Hypertension, Dyslipidemia and Diabetes.  Sonographer:     Sherrie Sport Referring Phys:  [id number] [facility] Aaralynn Shepheard  Diagnosing Phys: Yolonda Kida MD IMPRESSIONS  1. Left ventricular ejection fraction, by estimation, is 55 to 60%. The left ventricle has normal function. The left ventricle has no regional wall motion abnormalities. Left ventricular diastolic parameters are consistent with Grade I diastolic dysfunction (impaired relaxation).  2. Right ventricular systolic function is normal. The right ventricular size is normal.  3. The mitral valve is normal in structure. Moderate mitral valve regurgitation.  4. The aortic valve is normal in structure. Aortic valve regurgitation is mild. FINDINGS  Left Ventricle: Left ventricular ejection fraction, by estimation, is 55 to 60%. The left ventricle has normal function. The left ventricle has no regional wall motion abnormalities. The left ventricular internal cavity size was normal in size. There is  no left ventricular hypertrophy. Left ventricular diastolic parameters are consistent with Grade I diastolic dysfunction (impaired relaxation). Right Ventricle: The right ventricular size is normal. No increase in right ventricular wall thickness. Right ventricular systolic function is normal. Left Atrium: Left atrial size was normal in size. Right Atrium: Right atrial size was normal in size. Pericardium: There is no evidence of pericardial effusion. Mitral Valve: The mitral valve is normal in structure. Moderate mitral valve regurgitation. MV peak gradient, 4.8 mmHg. The mean mitral valve gradient is 1.0 mmHg. Tricuspid Valve: The tricuspid valve is grossly normal. Tricuspid valve regurgitation is mild. Aortic Valve: The aortic valve is normal in structure. Aortic valve regurgitation is mild. Aortic regurgitation PHT measures 567  msec. Aortic valve mean gradient measures 10.0 mmHg. Aortic valve peak gradient measures 16.9 mmHg. Aortic valve area, by VTI  measures 1.12 cm. Pulmonic Valve: The pulmonic valve was normal in structure. Pulmonic valve regurgitation is not visualized.  Aorta: The ascending aorta was not well visualized. IAS/Shunts: No atrial level shunt detected by color flow Doppler.  LEFT VENTRICLE PLAX 2D LVIDd:         4.40 cm   Diastology LVIDs:         3.10 cm   LV e' medial:    6.96 cm/s LV PW:         1.30 cm   LV E/e' medial:  11.2 LV IVS:        0.75 cm   LV e' lateral:   7.40 cm/s LVOT diam:     2.00 cm   LV E/e' lateral: 10.5 LV SV:         60 LV SV Index:   39 LVOT Area:     3.14 cm  RIGHT VENTRICLE RV Basal diam:  3.50 cm RV S prime:     10.60 cm/s TAPSE (M-mode): 1.5 cm LEFT ATRIUM             Index        RIGHT ATRIUM           Index LA diam:        3.20 cm 2.11 cm/m   RA Area:     12.70 cm LA Vol (A2C):   63.1 ml 41.56 ml/m  RA Volume:   29.50 ml  19.43 ml/m LA Vol (A4C):   46.0 ml 30.30 ml/m LA Biplane Vol: 53.3 ml 35.10 ml/m  AORTIC VALVE                     PULMONIC VALVE AV Area (Vmax):    1.15 cm      PV Vmax:        0.88 m/s AV Area (Vmean):   0.99 cm      PV Vmean:       57.400 cm/s AV Area (VTI):     1.12 cm      PV VTI:         0.194 m AV Vmax:           205.33 cm/s   PV Peak grad:   3.1 mmHg AV Vmean:          148.333 cm/s  PV Mean grad:   2.0 mmHg AV VTI:            0.532 m       RVOT Peak grad: 4 mmHg AV Peak Grad:      16.9 mmHg AV Mean Grad:      10.0 mmHg LVOT Vmax:         75.10 cm/s LVOT Vmean:        46.600 cm/s LVOT VTI:          0.190 m LVOT/AV VTI ratio: 0.36 AI PHT:            567 msec  AORTA Ao Root diam: 2.80 cm MITRAL VALVE                TRICUSPID VALVE MV Area (PHT): 2.26 cm     TR Peak grad:   38.2 mmHg MV Area VTI:   1.74 cm     TR Vmax:        309.00 cm/s MV Peak grad:  4.8 mmHg MV Mean grad:  1.0 mmHg     SHUNTS MV Vmax:       1.09 m/s     Systemic VTI:  0.19 m MV Vmean:      47.4 cm/s    Systemic Diam: 2.00 cm MV Decel Time: 335 msec     Pulmonic VTI:  0.220 m MV E velocity: 78.00 cm/s MV A velocity: 105.00 cm/s MV E/A ratio:  0.74 Dwayne D Callwood MD Electronically signed by Alwyn Pea MD Signature Date/Time:  12/07/2021/1:24:31 PM    Final    CT Angio Chest PE W/Cm &/Or Wo Cm  Result Date: 12/06/2021 CLINICAL DATA:  Pulmonary embolism (PE) suspected, high prob EXAM: CT ANGIOGRAPHY CHEST WITH CONTRAST TECHNIQUE: Multidetector CT imaging of the chest was performed using the standard protocol during bolus administration of intravenous contrast. Multiplanar CT image reconstructions and MIPs were obtained to evaluate the vascular anatomy. RADIATION DOSE REDUCTION: This exam was performed according to the departmental dose-optimization program which includes automated exposure control, adjustment of the mA and/or kV according to patient size and/or use of iterative reconstruction technique. CONTRAST:  35mL OMNIPAQUE IOHEXOL 350 MG/ML SOLN COMPARISON:  None Available. FINDINGS: Cardiovascular: Satisfactory opacification of the pulmonary arteries to the segmental level. No evidence of pulmonary embolism. Mild cardiomegaly. No pericardial disease. Prior CABG. Extensive native coronary artery calcification. Reflux of contrast into the IVC and hepatic veins. Moderate atherosclerosis of the thoracic aorta. Mediastinum/Nodes: Mildly enlarged precarinal lymph node measuring 1.1 cm, likely reactive. No axillary adenopathy. No hilar adenopathy. The thyroid is unremarkable. Small hiatal hernia. Lungs/Pleura: There is interlobular septal thickening and ground-glass opacities bilaterally. No airspace consolidation. There is a 4 mm pulmonary nodule in the right lung base (series 5, image 94). Upper Abdomen: No acute abnormality. Musculoskeletal: No chest wall abnormality. No acute or significant osseous findings. Review of the MIP images confirms the above findings. IMPRESSION: No evidence of pulmonary embolism.  Mild pulmonary edema. 4 mm pulmonary nodule in the right lung base (series 5, image 94). No routine follow-up imaging is recommended per Fleischner guidelines. These guidelines do not apply to immunocompromised patients and  patients with cancer. Follow up in patients with significant comorbidities as clinically warranted. Reference: Radiology. 2017; 284(1):228-43. Electronically Signed   By: Caprice Renshaw M.D.   On: 12/06/2021 09:54   DG Chest 2 View  Result Date: 12/06/2021 CLINICAL DATA:  81 year old female with history of chest pain. EXAM: CHEST - 2 VIEW COMPARISON:  Chest x-ray 06/01/2021. FINDINGS: There is cephalization of the pulmonary vasculature and slight indistinctness of the interstitial markings suggestive of mild pulmonary edema. No pleural effusions. Mild cardiomegaly. Upper mediastinal contours are within normal limits. Atherosclerotic calcifications are noted in the thoracic aorta. Status post median sternotomy. IMPRESSION: 1. The appearance the chest suggests mild congestive heart failure, as above. 2. Aortic atherosclerosis. Electronically Signed   By: Trudie Reed M.D.   On: 12/06/2021 09:04    LHC and coronary angiography 06/01/2021    Mid RCA lesion is 50% stenosed.   Ost RCA lesion is 50% stenosed.   Ost LM to Ost LAD lesion is 95% stenosed.   Prox LAD to Mid LAD lesion is 100% stenosed.   Mid LAD lesion is 100% stenosed.   Origin lesion is 100% stenosed.   The left ventricular systolic function is normal.   LV end diastolic pressure is normal.   The left ventricular ejection fraction is 55-65% by visual estimate.   Conclusion Non-STEMI presentation  with unstable features diffuse ST depression persistent anginal chest pain.  EKG did not meet STEMI criteria but clinically the patient presented with what appeared to be an acute coronary syndrome and cardiac cath was recommended emergently. Left ventricular function appeared to be relatively normal greater than 55% with mild inferior hypo- Coronaries showed diffuse left main disease 95% Mid LAD 100% CTO grafted Circumflex moderate size with minor irregularities RCA large very large long ostial to distal stent widely patent except for one  focal mid area of 50% in-stent restenosis TIMI-3 flow. Patient found to have severe peripheral vascular disease and right femoral artery and tortuous aorta Catheters and sheath were removed Intervention was deferred Patient was treated aggressively medically in ICU  ECHO 06/01/2021  1. Left ventricular ejection fraction, by estimation, is 45 to 50%. The  left ventricle has mildly decreased function. The left ventricle has no  regional wall motion abnormalities. The left ventricular internal cavity  size was mildly dilated. Left  ventricular diastolic parameters are consistent with Grade I diastolic  dysfunction (impaired relaxation).   2. Right ventricular systolic function is normal. The right ventricular  size is normal.   3. The mitral valve is normal in structure. Mild mitral valve  regurgitation. No evidence of mitral stenosis.   4. The aortic valve is normal in structure. Aortic valve regurgitation is  mild to moderate. Mild aortic valve stenosis.   5. The inferior vena cava is normal in size with greater than 50%  respiratory variability, suggesting right atrial pressure of 3 mmHg.   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  Cardiology is consulted for further assistance.  #Unstable angina #CAD s/p CABG x2 The patient reports her typical anginal symptoms right shoulder pain that radiates to the right side of her chest, associated with feeling sweaty and cold, and worse with exertion (walking to the bathroom or mailbox).   She reports taking 2 nitroglycerin every day until she is recently run out of the pill bottle due to this exertional angina.  She has extensive CAD with recent non-STEMI with peak troponin of 3200 in December where left heart cath revealed diffuse disease but patent LIMA to LAD and a small area of 50% ISR in her RCA stent.  She was treated medically with aspirin and Plavix and has been compliant with them since.  Her EKG on presentation is without acute ischemic changes, troponin is uptrending 55-100 -peak of 1600 and downtrending this morning. No further chest pain since yesterday.  -S/p 325 mg aspirin, continue aspirin 81 mg daily -Continue heparin drip for 48 hours (ending 6/16 at 1730) -Restart plavix 75 mg daily starting this afternoon -High intensity atorvastatin 80 mg daily -Continue metoprolol XL 50 mg daily with hold parameters for hypotension and bradycardia  -continue Imdur 30mg  daily -Repeat echocardiogram complete resulted with preserved LVEF, G1 DD without new WMA -discussed risks and benefits of heart catheterization with patient and she wishes to avoid and invasive strategy and treat  medically if at all possible, which is very reasonable considering difficulty with vascular access during prior heart cath and CKD -Okay for discharge from a cardiac standpoint later today after finishing 48 hours of heparin.  Will need follow-up in 1 to 2 weeks with her regular cardiologist Dr. Clayborn Bigness.  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 -Continue GDMT with metoprolol XL, lisinopril 20 mg, torsemide 20 mg daily with hold parameters for hypotension  -Discontinue HCTZ at discharge -Strict I's/O, daily weights  #CKD 3 Renal function currently around baseline with creatinine of 1.46, GFR 36 on admission - today  Cr improving to 1.39 and GFR 38  This patient's plan of care was discussed and created with Dr. Clayborn Bigness and he is in agreement.  Signed: Tristan Schroeder , PA-C 12/08/2021, 12:01 PM [facility]

## 2021-12-10 ENCOUNTER — Encounter: Payer: Self-pay | Admitting: Internal Medicine

## 2022-01-03 ENCOUNTER — Other Ambulatory Visit: Payer: Self-pay

## 2022-01-03 ENCOUNTER — Emergency Department: Payer: Medicare Other

## 2022-01-03 ENCOUNTER — Inpatient Hospital Stay
Admission: EM | Admit: 2022-01-03 | Discharge: 2022-01-09 | DRG: 280 | Disposition: A | Discharge status: Home Health | Payer: Medicare Other | Attending: Internal Medicine | Admitting: Internal Medicine

## 2022-01-03 ENCOUNTER — Inpatient Hospital Stay: Payer: Medicare Other

## 2022-01-03 DIAGNOSIS — D649 Anemia, unspecified: Secondary | ICD-10-CM | POA: Diagnosis present

## 2022-01-03 DIAGNOSIS — I469 Cardiac arrest, cause unspecified: Secondary | ICD-10-CM | POA: Diagnosis present

## 2022-01-03 DIAGNOSIS — G9341 Metabolic encephalopathy: Secondary | ICD-10-CM | POA: Diagnosis present

## 2022-01-03 DIAGNOSIS — Z794 Long term (current) use of insulin: Secondary | ICD-10-CM

## 2022-01-03 DIAGNOSIS — I252 Old myocardial infarction: Secondary | ICD-10-CM

## 2022-01-03 DIAGNOSIS — I214 Non-ST elevation (NSTEMI) myocardial infarction: Principal | ICD-10-CM | POA: Diagnosis present

## 2022-01-03 DIAGNOSIS — Z8249 Family history of ischemic heart disease and other diseases of the circulatory system: Secondary | ICD-10-CM

## 2022-01-03 DIAGNOSIS — E663 Overweight: Secondary | ICD-10-CM | POA: Diagnosis present

## 2022-01-03 DIAGNOSIS — K219 Gastro-esophageal reflux disease without esophagitis: Secondary | ICD-10-CM | POA: Diagnosis present

## 2022-01-03 DIAGNOSIS — E785 Hyperlipidemia, unspecified: Secondary | ICD-10-CM | POA: Diagnosis present

## 2022-01-03 DIAGNOSIS — I130 Hypertensive heart and chronic kidney disease with heart failure and stage 1 through stage 4 chronic kidney disease, or unspecified chronic kidney disease: Secondary | ICD-10-CM | POA: Diagnosis present

## 2022-01-03 DIAGNOSIS — J9601 Acute respiratory failure with hypoxia: Secondary | ICD-10-CM | POA: Diagnosis present

## 2022-01-03 DIAGNOSIS — N179 Acute kidney failure, unspecified: Secondary | ICD-10-CM | POA: Diagnosis present

## 2022-01-03 DIAGNOSIS — E1122 Type 2 diabetes mellitus with diabetic chronic kidney disease: Secondary | ICD-10-CM | POA: Diagnosis present

## 2022-01-03 DIAGNOSIS — Z87891 Personal history of nicotine dependence: Secondary | ICD-10-CM

## 2022-01-03 DIAGNOSIS — I5042 Chronic combined systolic (congestive) and diastolic (congestive) heart failure: Secondary | ICD-10-CM | POA: Diagnosis present

## 2022-01-03 DIAGNOSIS — R570 Cardiogenic shock: Secondary | ICD-10-CM | POA: Diagnosis not present

## 2022-01-03 DIAGNOSIS — Z7189 Other specified counseling: Secondary | ICD-10-CM | POA: Diagnosis not present

## 2022-01-03 DIAGNOSIS — E1129 Type 2 diabetes mellitus with other diabetic kidney complication: Secondary | ICD-10-CM | POA: Diagnosis present

## 2022-01-03 DIAGNOSIS — Z9861 Coronary angioplasty status: Secondary | ICD-10-CM

## 2022-01-03 DIAGNOSIS — I255 Ischemic cardiomyopathy: Secondary | ICD-10-CM | POA: Diagnosis present

## 2022-01-03 DIAGNOSIS — Z7982 Long term (current) use of aspirin: Secondary | ICD-10-CM

## 2022-01-03 DIAGNOSIS — R001 Bradycardia, unspecified: Secondary | ICD-10-CM | POA: Diagnosis not present

## 2022-01-03 DIAGNOSIS — I2510 Atherosclerotic heart disease of native coronary artery without angina pectoris: Secondary | ICD-10-CM | POA: Diagnosis present

## 2022-01-03 DIAGNOSIS — W19XXXA Unspecified fall, initial encounter: Secondary | ICD-10-CM

## 2022-01-03 DIAGNOSIS — G2581 Restless legs syndrome: Secondary | ICD-10-CM | POA: Diagnosis present

## 2022-01-03 DIAGNOSIS — T462X5A Adverse effect of other antidysrhythmic drugs, initial encounter: Secondary | ICD-10-CM | POA: Diagnosis not present

## 2022-01-03 DIAGNOSIS — E876 Hypokalemia: Secondary | ICD-10-CM | POA: Diagnosis present

## 2022-01-03 DIAGNOSIS — I700 Atherosclerosis of aorta: Secondary | ICD-10-CM | POA: Diagnosis not present

## 2022-01-03 DIAGNOSIS — Z79899 Other long term (current) drug therapy: Secondary | ICD-10-CM

## 2022-01-03 DIAGNOSIS — I10 Essential (primary) hypertension: Secondary | ICD-10-CM | POA: Diagnosis present

## 2022-01-03 DIAGNOSIS — Z6826 Body mass index (BMI) 26.0-26.9, adult: Secondary | ICD-10-CM

## 2022-01-03 DIAGNOSIS — Z7902 Long term (current) use of antithrombotics/antiplatelets: Secondary | ICD-10-CM

## 2022-01-03 DIAGNOSIS — J9621 Acute and chronic respiratory failure with hypoxia: Secondary | ICD-10-CM | POA: Diagnosis present

## 2022-01-03 DIAGNOSIS — R54 Age-related physical debility: Secondary | ICD-10-CM | POA: Diagnosis not present

## 2022-01-03 DIAGNOSIS — Z833 Family history of diabetes mellitus: Secondary | ICD-10-CM

## 2022-01-03 DIAGNOSIS — R0602 Shortness of breath: Principal | ICD-10-CM

## 2022-01-03 DIAGNOSIS — Z887 Allergy status to serum and vaccine status: Secondary | ICD-10-CM

## 2022-01-03 DIAGNOSIS — Z951 Presence of aortocoronary bypass graft: Secondary | ICD-10-CM

## 2022-01-03 DIAGNOSIS — I462 Cardiac arrest due to underlying cardiac condition: Secondary | ICD-10-CM | POA: Diagnosis present

## 2022-01-03 DIAGNOSIS — N1831 Chronic kidney disease, stage 3a: Secondary | ICD-10-CM | POA: Diagnosis present

## 2022-01-03 DIAGNOSIS — E119 Type 2 diabetes mellitus without complications: Secondary | ICD-10-CM | POA: Diagnosis present

## 2022-01-03 DIAGNOSIS — Z880 Allergy status to penicillin: Secondary | ICD-10-CM

## 2022-01-03 DIAGNOSIS — I4901 Ventricular fibrillation: Secondary | ICD-10-CM | POA: Diagnosis present

## 2022-01-03 DIAGNOSIS — E440 Moderate protein-calorie malnutrition: Secondary | ICD-10-CM | POA: Diagnosis present

## 2022-01-03 DIAGNOSIS — Y92230 Patient room in hospital as the place of occurrence of the external cause: Secondary | ICD-10-CM | POA: Diagnosis not present

## 2022-01-03 DIAGNOSIS — Z7951 Long term (current) use of inhaled steroids: Secondary | ICD-10-CM

## 2022-01-03 DIAGNOSIS — I5032 Chronic diastolic (congestive) heart failure: Secondary | ICD-10-CM | POA: Diagnosis present

## 2022-01-03 LAB — URINALYSIS, COMPLETE (UACMP) WITH MICROSCOPIC
Bacteria, UA: NONE SEEN
Bilirubin Urine: NEGATIVE
Glucose, UA: NEGATIVE mg/dL
Hgb urine dipstick: NEGATIVE
Ketones, ur: NEGATIVE mg/dL
Leukocytes,Ua: NEGATIVE
Nitrite: NEGATIVE
Protein, ur: 30 mg/dL — AB
Specific Gravity, Urine: 1.011 (ref 1.005–1.030)
pH: 5 (ref 5.0–8.0)

## 2022-01-03 LAB — BLOOD GAS, VENOUS
Acid-base deficit: 0.8 mmol/L (ref 0.0–2.0)
Bicarbonate: 25.4 mmol/L (ref 20.0–28.0)
O2 Saturation: 59.8 %
Patient temperature: 37
pCO2, Ven: 47 mmHg (ref 44–60)
pH, Ven: 7.34 (ref 7.25–7.43)
pO2, Ven: 37 mmHg (ref 32–45)

## 2022-01-03 LAB — BRAIN NATRIURETIC PEPTIDE: B Natriuretic Peptide: 120.8 pg/mL — ABNORMAL HIGH (ref 0.0–100.0)

## 2022-01-03 LAB — COMPREHENSIVE METABOLIC PANEL
ALT: 146 U/L — ABNORMAL HIGH (ref 0–44)
AST: 245 U/L — ABNORMAL HIGH (ref 15–41)
Albumin: 3.4 g/dL — ABNORMAL LOW (ref 3.5–5.0)
Alkaline Phosphatase: 111 U/L (ref 38–126)
Anion gap: 13 (ref 5–15)
BUN: 35 mg/dL — ABNORMAL HIGH (ref 8–23)
CO2: 26 mmol/L (ref 22–32)
Calcium: 8.5 mg/dL — ABNORMAL LOW (ref 8.9–10.3)
Chloride: 101 mmol/L (ref 98–111)
Creatinine, Ser: 2.06 mg/dL — ABNORMAL HIGH (ref 0.44–1.00)
GFR, Estimated: 24 mL/min — ABNORMAL LOW (ref >60)
Glucose, Bld: 179 mg/dL — ABNORMAL HIGH (ref 70–99)
Potassium: 3.1 mmol/L — ABNORMAL LOW (ref 3.5–5.1)
Sodium: 140 mmol/L (ref 135–145)
Total Bilirubin: 1.2 mg/dL (ref 0.3–1.2)
Total Protein: 5.4 g/dL — ABNORMAL LOW (ref 6.5–8.1)

## 2022-01-03 LAB — D-DIMER, QUANTITATIVE: D-Dimer, Quant: 2.11 ug/mL-FEU — ABNORMAL HIGH (ref 0.00–0.50)

## 2022-01-03 LAB — CBC
HCT: 28 % — ABNORMAL LOW (ref 36.0–46.0)
Hemoglobin: 8.9 g/dL — ABNORMAL LOW (ref 12.0–15.0)
MCH: 30.9 pg (ref 26.0–34.0)
MCHC: 31.8 g/dL (ref 30.0–36.0)
MCV: 97.2 fL (ref 80.0–100.0)
Platelets: 239 10*3/uL (ref 150–400)
RBC: 2.88 MIL/uL — ABNORMAL LOW (ref 3.87–5.11)
RDW: 14.5 % (ref 11.5–15.5)
WBC: 8.1 10*3/uL (ref 4.0–10.5)
nRBC: 0 % (ref 0.0–0.2)

## 2022-01-03 LAB — CBG MONITORING, ED: Glucose-Capillary: 159 mg/dL — ABNORMAL HIGH (ref 70–99)

## 2022-01-03 LAB — LACTIC ACID, PLASMA
Lactic Acid, Venous: 2.2 mmol/L (ref 0.5–1.9)
Lactic Acid, Venous: 2.6 mmol/L (ref 0.5–1.9)

## 2022-01-03 LAB — APTT: aPTT: 29 seconds (ref 24–36)

## 2022-01-03 LAB — PROTIME-INR
INR: 1.1 (ref 0.8–1.2)
Prothrombin Time: 13.9 seconds (ref 11.4–15.2)

## 2022-01-03 LAB — BASIC METABOLIC PANEL
Anion gap: 10 (ref 5–15)
BUN: 32 mg/dL — ABNORMAL HIGH (ref 8–23)
CO2: 24 mmol/L (ref 22–32)
Calcium: 8.2 mg/dL — ABNORMAL LOW (ref 8.9–10.3)
Chloride: 104 mmol/L (ref 98–111)
Creatinine, Ser: 2.29 mg/dL — ABNORMAL HIGH (ref 0.44–1.00)
GFR, Estimated: 21 mL/min — ABNORMAL LOW (ref >60)
Glucose, Bld: 153 mg/dL — ABNORMAL HIGH (ref 70–99)
Potassium: 3.5 mmol/L (ref 3.5–5.1)
Sodium: 138 mmol/L (ref 135–145)

## 2022-01-03 LAB — PROCALCITONIN: Procalcitonin: <0.1 ng/mL

## 2022-01-03 LAB — LIPASE, BLOOD: Lipase: 39 U/L (ref 11–51)

## 2022-01-03 LAB — MAGNESIUM: Magnesium: 2.1 mg/dL (ref 1.7–2.4)

## 2022-01-03 LAB — TROPONIN I (HIGH SENSITIVITY)
Troponin I (High Sensitivity): 17 ng/L (ref <18)
Troponin I (High Sensitivity): 17 ng/L (ref <18)

## 2022-01-03 MED ORDER — ASPIRIN 81 MG PO CHEW
324.0000 mg | CHEWABLE_TABLET | Freq: Once | ORAL | Status: DC
  Filled 2022-01-03: qty 4

## 2022-01-03 MED ORDER — PROPOFOL 1000 MG/100ML IV EMUL
0.0000 ug/kg/min | INTRAVENOUS | Status: DC
  Administered 2022-01-04: 40 ug/kg/min via INTRAVENOUS
  Administered 2022-01-05: 30 ug/kg/min via INTRAVENOUS
  Administered 2022-01-05: 25 ug/kg/min via INTRAVENOUS
  Filled 2022-01-03 (×4): qty 100

## 2022-01-03 MED ORDER — ASPIRIN 81 MG PO CHEW
324.0000 mg | CHEWABLE_TABLET | Freq: Once | ORAL | Status: AC
  Administered 2022-01-03: 324 mg via ORAL

## 2022-01-03 MED ORDER — PANTOPRAZOLE 2 MG/ML SUSPENSION
40.0000 mg | Freq: Every day | ORAL | Status: DC
  Administered 2022-01-04 – 2022-01-05 (×2): 40 mg
  Filled 2022-01-03 (×2): qty 20

## 2022-01-03 MED ORDER — ONDANSETRON HCL 4 MG/2ML IJ SOLN
4.0000 mg | Freq: Once | INTRAMUSCULAR | Status: AC
  Administered 2022-01-03: 4 mg via INTRAVENOUS

## 2022-01-03 MED ORDER — NOREPINEPHRINE 4 MG/250ML-% IV SOLN: 5.0000 ug/min | INTRAVENOUS | Status: DC

## 2022-01-03 MED ORDER — AMIODARONE HCL IN DEXTROSE 360-4.14 MG/200ML-% IV SOLN
60.0000 mg/h | INTRAVENOUS | Status: DC
  Administered 2022-01-03: 60 mg/h via INTRAVENOUS

## 2022-01-03 MED ORDER — NOREPINEPHRINE 4 MG/250ML-% IV SOLN
2.0000 ug/min | INTRAVENOUS | Status: DC
  Administered 2022-01-04: 2 ug/min via INTRAVENOUS
  Filled 2022-01-03: qty 250

## 2022-01-03 MED ORDER — AMIODARONE LOAD VIA INFUSION
150.0000 mg | Freq: Once | INTRAVENOUS | Status: AC
  Administered 2022-01-03: 300 mg via INTRAVENOUS
  Filled 2022-01-03: qty 83.34

## 2022-01-03 MED ORDER — NITROGLYCERIN 0.4 MG SL SUBL
0.4000 mg | SUBLINGUAL_TABLET | SUBLINGUAL | Status: DC | PRN
  Administered 2022-01-03: 0.4 mg via SUBLINGUAL
  Filled 2022-01-03: qty 1

## 2022-01-03 MED ORDER — PROPOFOL 1000 MG/100ML IV EMUL
INTRAVENOUS | Status: AC
  Administered 2022-01-03: 28.249 ug/kg/min via INTRAVENOUS
  Filled 2022-01-03: qty 100

## 2022-01-03 MED ORDER — FENTANYL CITRATE PF 50 MCG/ML IJ SOSY
50.0000 ug | PREFILLED_SYRINGE | Freq: Once | INTRAMUSCULAR | Status: DC
  Filled 2022-01-03 (×2): qty 1

## 2022-01-03 MED ORDER — LACTATED RINGERS IV BOLUS
1000.0000 mL | Freq: Once | INTRAVENOUS | Status: AC
  Administered 2022-01-03: 1000 mL via INTRAVENOUS

## 2022-01-03 MED ORDER — HEPARIN (PORCINE) 25000 UT/250ML-% IV SOLN
800.0000 [IU]/h | INTRAVENOUS | Status: AC
  Administered 2022-01-03 – 2022-01-05 (×2): 650 [IU]/h via INTRAVENOUS
  Filled 2022-01-03 (×2): qty 250

## 2022-01-03 MED ORDER — NITROGLYCERIN IN D5W 200-5 MCG/ML-% IV SOLN
0.0000 ug/min | INTRAVENOUS | Status: DC
  Filled 2022-01-03: qty 250

## 2022-01-03 MED ORDER — ETOMIDATE 2 MG/ML IV SOLN
INTRAVENOUS | Status: AC | PRN
  Administered 2022-01-03: 30 mg via INTRAVENOUS

## 2022-01-03 MED ORDER — PROPOFOL 10 MG/ML IV BOLUS
INTRAVENOUS | Status: AC | PRN
  Administered 2022-01-03: 1681.5 ug via INTRAVENOUS

## 2022-01-03 MED ORDER — LACTATED RINGERS IV BOLUS
500.0000 mL | Freq: Once | INTRAVENOUS | Status: AC
  Administered 2022-01-03: 500 mL via INTRAVENOUS

## 2022-01-03 MED ORDER — ROCURONIUM BROMIDE 50 MG/5ML IV SOLN
INTRAVENOUS | Status: AC | PRN
  Administered 2022-01-03: 100 mg via INTRAVENOUS

## 2022-01-03 MED ORDER — SODIUM CHLORIDE 0.9 % IV SOLN: 250.0000 mL | INTRAVENOUS | Status: DC

## 2022-01-03 MED ORDER — ALBUTEROL SULFATE (2.5 MG/3ML) 0.083% IN NEBU
2.5000 mg | INHALATION_SOLUTION | RESPIRATORY_TRACT | Status: DC
  Administered 2022-01-04 (×2): 2.5 mg via RESPIRATORY_TRACT
  Filled 2022-01-03: qty 3

## 2022-01-03 MED ORDER — FENTANYL CITRATE PF 50 MCG/ML IJ SOSY
50.0000 ug | PREFILLED_SYRINGE | Freq: Once | INTRAMUSCULAR | Status: AC
  Administered 2022-01-03: 50 ug via INTRAVENOUS
  Filled 2022-01-03: qty 1

## 2022-01-03 MED ORDER — POLYETHYLENE GLYCOL 3350 17 G PO PACK: 17.0000 g | PACK | Freq: Every day | ORAL | Status: DC | PRN

## 2022-01-03 MED ORDER — DOCUSATE SODIUM 100 MG PO CAPS: 100.0000 mg | ORAL_CAPSULE | Freq: Two times a day (BID) | ORAL | Status: DC | PRN

## 2022-01-03 MED ORDER — INSULIN ASPART 100 UNIT/ML IJ SOLN
0.0000 [IU] | INTRAMUSCULAR | Status: DC
  Administered 2022-01-03 – 2022-01-04 (×2): 2 [IU] via SUBCUTANEOUS
  Administered 2022-01-04 – 2022-01-05 (×2): 1 [IU] via SUBCUTANEOUS
  Administered 2022-01-05: 2 [IU] via SUBCUTANEOUS
  Filled 2022-01-03 (×5): qty 1

## 2022-01-03 MED ORDER — HEPARIN BOLUS VIA INFUSION
3300.0000 [IU] | Freq: Once | INTRAVENOUS | Status: AC
Start: 2022-01-03 — End: 2022-01-03
  Administered 2022-01-03: 3300 [IU] via INTRAVENOUS
  Filled 2022-01-03: qty 3300

## 2022-01-03 MED ORDER — CLOPIDOGREL BISULFATE 75 MG PO TABS
300.0000 mg | ORAL_TABLET | Freq: Once | ORAL | Status: AC
  Administered 2022-01-03: 300 mg via ORAL
  Filled 2022-01-03: qty 4

## 2022-01-03 MED ORDER — AMIODARONE HCL IN DEXTROSE 360-4.14 MG/200ML-% IV SOLN
30.0000 mg/h | INTRAVENOUS | Status: DC
  Administered 2022-01-04 (×2): 30 mg/h via INTRAVENOUS
  Filled 2022-01-03 (×2): qty 200

## 2022-01-03 MED ORDER — AMIODARONE IV BOLUS ONLY 150 MG/100ML
INTRAVENOUS | Status: AC
  Filled 2022-01-03: qty 200

## 2022-01-03 MED ORDER — ONDANSETRON HCL 4 MG/2ML IJ SOLN
INTRAMUSCULAR | Status: AC
  Filled 2022-01-03: qty 2

## 2022-01-03 MED ORDER — ALBUTEROL SULFATE (2.5 MG/3ML) 0.083% IN NEBU: 2.5000 mg | INHALATION_SOLUTION | RESPIRATORY_TRACT | Status: DC | PRN

## 2022-01-03 NOTE — Progress Notes (Signed)
Pt. Has currently 3 working PIVs and not on vasopressors. USPIV not needed as of this time. Confirmed with RN.

## 2022-01-03 NOTE — ED Triage Notes (Signed)
Pt to ED via [facility] from home. Pt reports SOB x3 hrs and worse with exertion. Pt labored and increased WOB. Pt placed on 2L [place] for comfort. EKG shown to MD [facility] and states need for repeat EKG in due to new changes.  EMS VS:  BP 100/50 RA 97% CBG 166 SB HR 58 ETCO2 35

## 2022-01-03 NOTE — ED Notes (Signed)
No change in pain after having 1 sublingual nitro. Pain 9/10 remains on right side of chest

## 2022-01-03 NOTE — ED Notes (Signed)
Pt keeps nodding off in triage. Repeat EKG obtained and shown to Dr. Fuller Plan. Order for VBG placed.

## 2022-01-03 NOTE — ED Provider Triage Note (Signed)
Emergency Medicine Provider Triage Evaluation Note  Lindsey Pollard , a 81 y.o. female  was evaluated in triage.  Pt complains of SOB that began around 3 hours pta while shopping. Feels lightheaded. Np CP. Felt ok while shopping and then layed down and symptoms began. Has leg swelling which patient reports she always has. No nausea  Patient Active Problem List   Diagnosis Date Noted   Unstable angina (HCC) 12/07/2021   NSTEMI (non-ST elevated myocardial infarction) (HCC) 12/06/2021   Chronic kidney disease, stage 3a (HCC) 12/06/2021   Lung nodule 12/06/2021   Type II diabetes mellitus with renal manifestations (HCC) 12/06/2021   Chronic combined systolic and diastolic CHF (congestive heart failure) (HCC) 12/06/2021   HTN (hypertension) 12/06/2021   HLD (hyperlipidemia) 12/06/2021   AKI (acute kidney injury) (HCC)    Anemia    Acute respiratory failure with hypoxia (HCC)    Chronic diastolic CHF (congestive heart failure) (HCC)    Fungal infection of the groin    Fungal infection of skin    Non-STEMI (non-ST elevated myocardial infarction) (HCC) 06/01/2021   Essential hypertension 06/01/2021   Carotid stenosis 10/26/2020   Atherosclerosis of native arteries of extremity with intermittent claudication (HCC) 09/16/2020   Carotid bruit present 09/16/2020   Healthcare maintenance 01/06/2019   Aortic calcification (HCC) 06/19/2017   Ischemic cardiomyopathy 06/19/2017   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/17/2017   PVD (peripheral vascular disease) (HCC) 06/14/2017   Morbid obesity due to excess calories (HCC) 06/07/2015   Pseudophakia of right eye 11/10/2014   Bilateral hearing loss 10/05/2014   CAD (coronary artery disease) 10/05/2014   GERD (gastroesophageal reflux disease) 10/05/2014   RLS (restless legs syndrome) 09/09/2014   Mixed hyperlipidemia 08/27/2014   Type 2 diabetes mellitus with hyperlipidemia (HCC) 08/27/2014    Review of Systems  Positive: SOB, lightheaded Negative: CP/abd  pain  Physical Exam  There were no vitals taken for this visit. Gen:   Awake, no distress   Resp:  Normal effort  MSK:   Moves extremities without difficulty  Other:    Medical Decision Making  Medically screening exam initiated at 6:29 PM.  Appropriate orders placed.  KRISTAN BRUMMITT was informed that the remainder of the evaluation will be completed by another provider, this initial triage assessment does not replace that evaluation, and the importance of remaining in the ED until their evaluation is complete.     Jackelyn Hoehn, PA-C 01/03/22 [phone]

## 2022-01-03 NOTE — ED Notes (Signed)
To CT

## 2022-01-03 NOTE — ED Provider Notes (Addendum)
[facility] Provider Note    Event Date/Time   First MD Initiated Contact with Patient 01/03/22 1907     (approximate)   History   Shortness of Breath   HPI  Lindsey Pollard is a 81 y.o. 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  She denies any cough, fevers, chest pain, headache or earache, sore throat, abdominal pain, nausea, vomiting, diarrhea, rash or urinary symptoms.  She does note she fell 2 or 3 days ago and had left side of her head.  She states she fell she lost her footing.  She states she uses a walker when she ambulates outside but does not otherwise use a walker or cane.  She denies any other recent sick symptoms.  She reports compliance all her medications.  Denies any recent EtOH use illicit drug use or tobacco use.  She does note that since coming to the hospital she feels she has had some increased crampiness in both legs.      Physical Exam  Triage Vital Signs: ED Triage Vitals [01/03/22 1830]  Enc Vitals Group     BP (!) 126/33     Pulse Rate (!) 57     Resp (!) 26     Temp 98.4 F (36.9 C)     Temp Source Oral     SpO2 100 %     Weight      Height      Head Circumference      Peak Flow      Pain Score      Pain Loc      Pain Edu?      Excl. in GC?     Most recent vital signs: Vitals:   01/03/22 2250 01/03/22 2255  BP: (!) 129/54 (!) 134/51  Pulse: 71 72  Resp: 18 19  Temp:    SpO2: 100% 100%    General: Awake, no distress.  CV:  Good peripheral perfusion.  2+ radial pulses Resp:  Slight increased effort and tachypnea and diminished bilaterally.  No audible wheezing or rhonchi Abd:  No distention.  Soft. Other:  Ecchymosis over the left temporal area.  No other evidence trauma to the face scalp head or neck.  No tenderness along the  C/T/L-spine.  Patient has no tenderness in extremities and she is moving all extremities with symmetric strength   ED Results / Procedures / Treatments  Labs (all labs ordered are listed, but only abnormal results are displayed) Labs Reviewed  BASIC METABOLIC PANEL - Abnormal; Notable for the following components:      Result Value   Glucose, Bld 153 (*)    BUN 32 (*)    Creatinine, Ser 2.29 (*)    Calcium 8.2 (*)    GFR, Estimated 21 (*)    All other components within normal limits  CBC - Abnormal; Notable for the following components:   RBC 2.88 (*)    Hemoglobin 8.9 (*)    HCT 28.0 (*)    All other components within normal limits  BRAIN NATRIURETIC PEPTIDE - Abnormal; Notable for the following components:   B Natriuretic Peptide 120.8 (*)    All other components within normal limits  LACTIC ACID, PLASMA - Abnormal; Notable for the following components:   Lactic Acid, Venous  2.2 (*)    All other components within normal limits  URINALYSIS, COMPLETE (UACMP) WITH MICROSCOPIC - Abnormal; Notable for the following components:   Color, Urine YELLOW (*)    APPearance CLEAR (*)    Protein, ur 30 (*)    All other components within normal limits  D-DIMER, QUANTITATIVE (NOT AT [facility]) - Abnormal; Notable for the following components:   D-Dimer, Quant 2.11 (*)    All other components within normal limits  CULTURE, BLOOD (ROUTINE X 2)  CULTURE, BLOOD (ROUTINE X 2)  BLOOD GAS, VENOUS  MAGNESIUM  PROTIME-INR  APTT  PROCALCITONIN  CBC  HEPARIN LEVEL (UNFRACTIONATED)  TRIGLYCERIDES  BLOOD GAS, ARTERIAL  COMPREHENSIVE METABOLIC PANEL  LIPASE, BLOOD  LACTIC ACID, PLASMA  LACTIC ACID, PLASMA  PROCALCITONIN  PROCALCITONIN  LEGIONELLA PNEUMOPHILA SEROGP 1 UR AG  STREP PNEUMONIAE URINARY ANTIGEN  PHOSPHORUS  MAGNESIUM  BLOOD GAS, ARTERIAL  BASIC METABOLIC PANEL  TROPONIN I (HIGH SENSITIVITY)  TROPONIN I (HIGH SENSITIVITY)     EKG  EKG is remarkable sinus bradycardia with  ventricular rate of 55, normal axis with nonspecific ST change in leads II, lead III and aVL without other clear evidence of acute ischemia or significant arrhythmia.   RADIOLOGY Chest reviewed by myself shows no focal consoidation, effusion, edema, pneumothorax or other clear acute thoracic process. I also reviewed radiology interpretation and agree with findings described.  CT chest abdomen pelvis on my interpretation without evidence of overt edema, pneumothorax, large effusion, focal consolidation, rib fracture or ureteral stone or obstruction.  I reviewed radiology interpretation and agree to findings of no acute process as well as evidence of diffuse atherosclerosis and cholelithiasis as well as left colonic diverticulosis but no other acute process.  PROCEDURES:  Critical Care performed: Yes, see critical care procedure note(s)  .1-3 Lead EKG Interpretation  Performed by: Gilles Chiquito, MD Authorized by: Gilles Chiquito, MD     Interpretation: normal     ECG rate assessment: normal     Rhythm: sinus rhythm     Ectopy: none     Conduction: normal   .Critical Care  Performed by: Gilles Chiquito, MD Authorized by: Gilles Chiquito, MD   Critical care provider statement:    Critical care time (minutes):  90   Critical care was time spent personally by me on the following activities:  Development of treatment plan with patient or surrogate, discussions with consultants, evaluation of patient's response to treatment, examination of patient, ordering and review of laboratory studies, ordering and review of radiographic studies, ordering and performing treatments and interventions, pulse oximetry, re-evaluation of patient's condition and review of old charts Procedure Name: Intubation Date/Time: 01/03/2022 11:23 PM  Performed by: Gilles Chiquito, MDPre-anesthesia Checklist: Patient identified, Patient being monitored, Timeout performed, Emergency Drugs available and Suction  available Oxygen Delivery Method: Simple face mask Induction Type: Rapid sequence Number of attempts: 1 Airway Equipment and Method: Video-laryngoscopy Placement Confirmation: ETT inserted through vocal cords under direct vision Tube secured with: ETT holder      The patient is on the cardiac monitor to evaluate for evidence of arrhythmia and/or significant heart rate changes.   MEDICATIONS ORDERED IN ED: Medications  heparin bolus via infusion 3,300 Units (3,300 Units Intravenous Bolus from Bag 01/03/22 2122)    Followed by  heparin ADULT infusion 100 units/mL (25000 units/282mL) (650 Units/hr Intravenous Restarted 01/03/22 2219)  nitroGLYCERIN (NITROSTAT) SL tablet 0.4 mg (0.4 mg Sublingual Given 01/03/22 2111)  fentaNYL (SUBLIMAZE) injection  50 mcg (50 mcg Intravenous Not Given 01/03/22 2312)  nitroGLYCERIN 50 mg in dextrose 5 % 250 mL (0.2 mg/mL) infusion (has no administration in time range)  amiodarone (NEXTERONE) 150-4.21 MG/100ML-% bolus (has no administration in time range)  amiodarone (NEXTERONE) 1.8 mg/mL load via infusion 150 mg (300 mg Intravenous Bolus from Bag 01/03/22 2143)    Followed by  amiodarone (NEXTERONE PREMIX) 360-4.14 MG/200ML-% (1.8 mg/mL) IV infusion (60 mg/hr Intravenous New Bag/Given 01/03/22 2223)    Followed by  amiodarone (NEXTERONE PREMIX) 360-4.14 MG/200ML-% (1.8 mg/mL) IV infusion (has no administration in time range)  propofol (DIPRIVAN) 1000 MG/100ML infusion (28.249 mcg/kg/min  59 kg Intravenous New Bag/Given 01/03/22 2221)  docusate sodium (COLACE) capsule 100 mg (has no administration in time range)  polyethylene glycol (MIRALAX / GLYCOLAX) packet 17 g (has no administration in time range)  insulin aspart (novoLOG) injection 0-9 Units (has no administration in time range)  pantoprazole sodium (PROTONIX) 40 mg/20 mL oral suspension 40 mg (has no administration in time range)  0.9 %  sodium chloride infusion (has no administration in time range)   albuterol (PROVENTIL) (2.5 MG/3ML) 0.083% nebulizer solution 2.5 mg (has no administration in time range)  albuterol (PROVENTIL) (2.5 MG/3ML) 0.083% nebulizer solution 2.5 mg (has no administration in time range)  0.9 %  sodium chloride infusion (has no administration in time range)  norepinephrine (LEVOPHED) 4mg  in 268mL (0.016 mg/mL) premix infusion (has no administration in time range)  lactated ringers bolus 1,000 mL (0 mLs Intravenous Stopped 01/03/22 2112)  lactated ringers bolus 500 mL (0 mLs Intravenous Stopped 01/03/22 2212)  aspirin chewable tablet 324 mg (324 mg Oral Given 01/03/22 2105)  ondansetron (ZOFRAN) injection 4 mg (4 mg Intravenous Given 01/03/22 2103)  fentaNYL (SUBLIMAZE) injection 50 mcg (50 mcg Intravenous Given 01/03/22 2117)  clopidogrel (PLAVIX) tablet 300 mg (300 mg Oral Given 01/03/22 2116)  propofol (DIPRIVAN) 10 mg/mL bolus/IV push (1,681.5 mcg Intravenous Given 01/03/22 2152)  etomidate (AMIDATE) injection (30 mg Intravenous Given 01/03/22 2156)  rocuronium (ZEMURON) injection (100 mg Intravenous Given 01/03/22 2146)     IMPRESSION / MDM / ASSESSMENT AND PLAN / ED COURSE  I reviewed the triage vital signs and the nursing notes. Patient's presentation is most consistent with acute presentation with potential threat to life or bodily function.                               Differential diagnosis includes, but is not limited to ACS, PE, CHF, pneumonia, pneumothorax, pericardial effusion, anemia and arrhythmia.  Is also somewhat concerning that she fell couple days ago and hit her head has evidence of bruising of left temple area.  Given her age will obtain CT head and C-spine.  We will rule out skull fracture and intracranial hemorrhage or occult C-spine injury.  EKG is remarkable sinus bradycardia with ventricular rate of 55, normal axis with nonspecific ST change in leads II, lead III and aVL without other clear evidence of acute ischemia or significant  arrhythmia.  Chest reviewed by myself shows no focal consoidation, effusion, edema, pneumothorax or other clear acute thoracic process. I also reviewed radiology interpretation and agree with findings described.  BMP is remarkable for evidence of AKI with a creatinine of 2.29 compared to 1.393 weeks ago with no other significant electrolyte or metabolic derangements.  CBC shows no cytosis and hemoglobin 8.9.  Seems patient ranges between 9 and 10.6.  Initial troponin is  nonelevated 17.  BNP is 120.8 compared to 384 4 weeks ago.  VBG without evidence of significant hypercarbic failure with a pH of 7.34 with a PCO2 of 47 and bicarb of 25.4  While undergoing initial evaluation patient did develop some chest pain.  Repeat EKG shows fairly diffuse ST depressions.  Immediate consult with on-call cardiologist Dr. Nehemiah Massed who agreed with medical management which was initiated including given oral ASA, Plavix, sublingual nitroglycerin, Zofran and fentanyl patient also developed some nausea.  Shortly after this without much improvement patient went into cardiac arrest with initial rhythm showing V-fib.  He was defibrillated and subsequently underwent synchronized cardioversion for unstable V. tach.  She received less than 4 minutes total of CPR.  She was loaded with amiodarone.  She was intubated per procedure note above.  Interventional cardiology consulted.  Patient seen by Dr. Hermenia Fiscal who recommends medical management at this time.  Patient will be admitted to medical ICU.  Please note postintubation chest x-ray showed right mainstem intubation and ET tube was retracted 4 and half centimeters.  Post retraction chest x-ray ordered.      FINAL CLINICAL IMPRESSION(S) / ED DIAGNOSES   Final diagnoses:  SOB (shortness of breath)  AKI (acute kidney injury) ([place])  Fall, initial encounter  Cardiac arrest [facility])     Rx / DC Orders   ED Discharge Orders     None        Note:  This document was prepared  using Dragon voice recognition software and may include unintentional dictation errors.   Lucrezia Starch, MD 01/03/22 2325    Lucrezia Starch, MD 01/03/22 [phone]

## 2022-01-03 NOTE — ED Notes (Signed)
Patient states she is having right sided chest pain. MD made aware

## 2022-01-03 NOTE — ED Notes (Signed)
2137 Walked in to room patient was in Vfib with no pulse. NRB Started 2137 Compression started, Ambu bag started 2138 Resp Called 2138 Dr at bedside 149HR 165/79BP 2138 Epi given 0.1mg /ml 2139 Patient began moving arms and groaning 112HR, 100% NRB,  2140 Heart 155 Vfib with pulse Sync Cardioversion 200J 2143 300mg  Bolus amiodarone Given IV 2146 30mg  etomidate 2146 rocuruonioum 2147 Intubation attempt 7.5 Et, 24 @ lip 96HR, 117/80, 17R 2150 18G IV R Upper Arm started 2152 Proppofol started 28.91mcg/kg, Vent connected 2155 18 OG

## 2022-01-03 NOTE — Consult Note (Signed)
ANTICOAGULATION CONSULT NOTE  Pharmacy Consult for IV Heparin Indication: chest pain/ACS  Patient Measurements: Height: 4\' 11"  (149.9 cm) Weight: 59 kg (130 lb) IBW/kg (Calculated) : 43.2 Heparin Dosing Weight: 55.5 kg  Labs: Recent Labs    01/03/22 1826 01/03/22 1945  HGB 8.9*  --   HCT 28.0*  --   PLT 239  --   APTT  --  29  LABPROT  --  13.9  INR  --  1.1  CREATININE 2.29*  --   TROPONINIHS 17 17    Estimated Creatinine Clearance: 15.3 mL/min (A) (by C-G formula based on SCr of 2.29 mg/dL (H)).   Medical History: Past Medical History:  Diagnosis Date   Chest pain 12/06/2021   Diabetes mellitus without complication (HCC)    Hyperlipidemia    Hypertension    Myocardial infarction [facility])     Medications:  No anticoagulation prior to admission per my chart review  Assessment: Patient is an 81 y/o F with medical history as above and including myocardial infarction who presented to the ED 7/12 PM with SOB and increased WOB. There is concern for acute coronary syndrome and pharmacy consulted to initiate and manage heparin infusion.  Baseline CBC notable for anemia with Hgb 8.9 which is overall consistent with patient's usual range. Baseline aPTT and PT-INR are within normal limits.  Goal of Therapy:  Heparin level 0.3-0.7 units/ml Monitor platelets by anticoagulation protocol: Yes   Plan:  --Heparin 3300 unit IV bolus followed by continuous infusion at 650 units/hr --HL 8 hours after initiation of infusion --Daily CBC per protocol while on IV heparin  9/12 01/03/2022,9:11 PM

## 2022-01-03 NOTE — ED Notes (Signed)
Called Phlebotomy for lab work

## 2022-01-03 NOTE — H&P (Incomplete)
NAME:  Lindsey Pollard, MRN:  [id number], DOB:  1941/06/17, LOS: 0 ADMISSION DATE:  01/03/2022, CONSULTATION DATE:/05/2022 REFERRING MD: Antoine Primas, MD, CHIEF COMPLAINT: Shortness of breath/chest pain   HPI  81 y.o female with medical history significant of CAD, s/p of CABG, HTN, HLD, DM, PAD, CHF with EF 45-50%, CKD-3a, and RLS, who presents  to the ED with chief complaints of shortness of breath worse with exertion with no associated symptoms.  Patient reported a fall 3 days ago hitting the side of her head without LOC.  ED Course: Initial vitals int he ED; the temperature was 36.9C, the heart rate 57 beats/minute, the blood pressure 126/33  mm Hg, the respiratory rate 26 breaths/minute, and the oxygen saturation 100% on RA. Patient's noted with increased work of breathing.  She was placed on 2 L via [place] with improvement in her sats.  At around 9 pm, patient started complaining of right-sided chest pain rating 10/10 without other associated symptoms.  She received 1 sublingual nitro with no improvement in her pain.  Shortly after patient went into V-fib arrest without a pulse.  CPR/ACLS initiated, patient received Epi x1 dose and immediately began moving her arms and groaning  with monitor showing V-tach at rate  of 155 bmp with a pulse.  Patient was cardioverted at 200 J back to NSR and 300 mg of amiodarone bolus administered followed by infusion.  Patient was intubated for airway protection.  Post cardiac arrest EKG was obtained and showed persistent diffuse ST depression.  STEMI on-call MD was notified who evaluated patient at the bedside.  PCCM consulted for admission to the ICU.  Pertinent Labs /Diagnostics Findings in the ED: Na+/ K+: 138/3.5 Glucose: 153 BUN/Cr.:  32/2.29 Calcium: 8.2   WBC/ TMAX: 8.1/ afebrile Hgb/Hct: 8.9/28.0 PCT: negative <0.10 Lactic acid: 2.2   Troponin: 17 BNP: 120.8 VBG result:  pO2 37; pCO2 47; pH 7.34;  HCO3 25.4, %O2 Sat 59.8.   Past Medical History   CAD, s/p of CABG, HTN, HLD, DM, PAD, CHF with EF 45-50%, CKD-3a, and RLS  Significant Hospital Events   7/12: Mated to ICU with NSTEMI status post V-fib cardiac arrest  Consults:  Cardiology  Procedures:  7/12: Intubation  Significant Diagnostic Tests:  7/12: Chest Xray> no acute cardiopulmonary process 7/12: Noncontrast CT head> no acute intracranial abnormality 7/12: CT Chest abdomen and pelvis> no acute cardiopulmonary process  Micro Data:  7/12: Blood culture x2> 7/12: Urine Culture> 7/12: MRSA PCR>>  7/12: Strep pneumo urinary antigen> 7/12: Legionella urinary antigen>  Antimicrobials:  None  OBJECTIVE  Blood pressure (!) 121/55, pulse 72, temperature 98.4 F (36.9 C), temperature source Oral, resp. rate 18, height 4\' 11"  (1.499 m), weight 59 kg, SpO2 100 %.    Vent Mode: AC FiO2 (%):  [100 %] 100 % Set Rate:  [16 bmp] 16 bmp Vt Set:  [450 mL] 450 mL PEEP:  [5 cmH20] 5 cmH20  No intake or output data in the 24 hours ending 01/03/22 2231 Filed Weights   01/03/22 2107  Weight: 59 kg    Physical Examination  GENERAL:81  year-old critically ill patient lying in the bed intubated, mechanically ventilated and sedated EYES: Pupils equal, round, reactive to light and accommodation. No scleral icterus. Extraocular muscles intact.  HEENT: Head atraumatic, normocephalic. Oropharynx and nasopharynx clear.  NECK:  Supple, no jugular venous distention. No thyroid enlargement, no tenderness.  LUNGS: Decreased l breath sounds bilaterally, no wheezing, rales,rhonchi or crepitation. No  use of accessory muscles of respiration.  CARDIOVASCULAR: S1, S2 normal. No murmurs, rubs, or gallops.  ABDOMEN: Soft, nontender, nondistended. Bowel sounds present. No organomegaly or mass.  EXTREMITIES: No pedal edema, cyanosis, or clubbing.  NEUROLOGIC: Cranial nerves II through XII are intact.  Unable to assess muscle strength. Sensation intact. Gait not checked.  PSYCHIATRIC: The patient is  intubated and sedated SKIN: No obvious rash, lesion, or ulcer.   Labs/imaging that I havepersonally reviewed  (right click and "Reselect all SmartList Selections" daily)     Labs   CBC: Recent Labs  Lab 01/03/22 1826  WBC 8.1  HGB 8.9*  HCT 28.0*  MCV 97.2  PLT 239    Basic Metabolic Panel: Recent Labs  Lab 01/03/22 1826  NA 138  K 3.5  CL 104  CO2 24  GLUCOSE 153*  BUN 32*  CREATININE 2.29*  CALCIUM 8.2*  MG 2.1   GFR: Estimated Creatinine Clearance: 15.3 mL/min (A) (by C-G formula based on SCr of 2.29 mg/dL (H)). Recent Labs  Lab 01/03/22 1826 01/03/22 1945  PROCALCITON  --  <0.10  WBC 8.1  --   LATICACIDVEN  --  2.2*    Liver Function Tests: No results for input(s): "AST", "ALT", "ALKPHOS", "BILITOT", "PROT", "ALBUMIN" in the last 168 hours. No results for input(s): "LIPASE", "AMYLASE" in the last 168 hours. No results for input(s): "AMMONIA" in the last 168 hours.  ABG    Component Value Date/Time   PHART 7.32 (L) 06/01/2021 0557   PCO2ART 41 06/01/2021 0557   PO2ART 103 06/01/2021 0557   HCO3 25.4 01/03/2022 1842   ACIDBASEDEF 0.8 01/03/2022 1842   O2SAT 59.8 01/03/2022 1842     Coagulation Profile: Recent Labs  Lab 01/03/22 1945  INR 1.1    Cardiac Enzymes: No results for input(s): "CKTOTAL", "CKMB", "CKMBINDEX", "TROPONINI" in the last 168 hours.  HbA1C: Hgb A1c MFr Bld  Date/Time Value Ref Range Status  12/06/2021 11:42 AM 6.8 (H) 4.8 - 5.6 % Final    Comment:    (NOTE) Pre diabetes:          5.7%-6.4%  Diabetes:              >6.4%  Glycemic control for   <7.0% adults with diabetes   06/01/2021 09:30 AM 6.7 (H) 4.8 - 5.6 % Final    Comment:    (NOTE)         Prediabetes: 5.7 - 6.4         Diabetes: >6.4         Glycemic control for adults with diabetes: <7.0     CBG: No results for input(s): "GLUCAP" in the last 168 hours.  Review of Systems:   UNABLE TO OBTAIN PATIENT IS INTUBATED AND SEDATED  Past Medical  History  She,  has a past medical history of Chest pain (12/06/2021), Diabetes mellitus without complication (HCC), Hyperlipidemia, Hypertension, and Myocardial infarction (HCC).   Surgical History    Past Surgical History:  Procedure Laterality Date   CARDIAC SURGERY     bypass   CORONARY/GRAFT ACUTE MI REVASCULARIZATION N/A 06/01/2021   Procedure: Coronary/Graft Acute MI Revascularization;  Surgeon: Alwyn Pea, MD;  Location: [facility] INVASIVE CV LAB;  Service: Cardiovascular;  Laterality: N/A;   EYE SURGERY     cataract extraction   LEFT HEART CATH AND CORONARY ANGIOGRAPHY N/A 06/01/2021   Procedure: LEFT HEART CATH AND CORONARY ANGIOGRAPHY;  Surgeon: Alwyn Pea, MD;  Location: [facility] INVASIVE CV  LAB;  Service: Cardiovascular;  Laterality: N/A;     Social History   reports that she has quit smoking. Her smoking use included cigarettes. She has never used smokeless tobacco. She reports that she does not currently use alcohol. She reports that she does not currently use drugs.   Family History   Her family history includes Congestive Heart Failure in her mother; Diabetes in her father. There is no history of Breast cancer.   Allergies Allergies  Allergen Reactions   Tetanus Toxoids Swelling    Tetanus and diphtheria toxids   Penicillin G Rash     Home Medications  Prior to Admission medications   Medication Sig Start Date End Date Taking? Authorizing Provider  acetaminophen (TYLENOL) 325 MG tablet Take 650 mg by mouth at bedtime.    [provider]  albuterol (VENTOLIN HFA) 108 (90 Base) MCG/ACT inhaler Inhale 1-2 puffs into the lungs every 4 (four) hours as needed for shortness of breath. 08/09/21   [provider]  aspirin 81 MG EC tablet Take by mouth.    [provider]  atorvastatin (LIPITOR) 80 MG tablet Take 80 mg by mouth every evening. 07/06/20   [provider]  baclofen (LIORESAL) 10 MG tablet Take 10 mg by mouth 3 (three)  times daily. 10/01/20   [provider]  Cholecalciferol 25 MCG (1000 UT) capsule Take 1,000 Units by mouth 2 (two) times daily.    [provider]  clopidogrel (PLAVIX) 75 MG tablet Take 1 tablet (75 mg total) by mouth daily. Patient not taking: Reported on 10/03/2021 06/04/21   Loletha Grayer, MD  insulin glargine (LANTUS) 100 UNIT/ML injection Inject 8 Units into the skin at bedtime. 07/24/21   [provider]  insulin lispro (HUMALOG) 100 UNIT/ML KwikPen Inject 0-2 Units into the skin 3 (three) times daily. 07/24/21   [provider]  ipratropium-albuterol (DUONEB) 0.5-2.5 (3) MG/3ML SOLN Take 3 mLs by nebulization every 6 (six) hours as needed (shortness of breath). 08/29/21   [provider]  isosorbide mononitrate (IMDUR) 30 MG 24 hr tablet Take 1 tablet (30 mg total) by mouth daily. 12/09/21   Nicole Kindred A, DO  lisinopril (ZESTRIL) 20 MG tablet Take 1 tablet (20 mg total) by mouth daily. 12/09/21   Nicole Kindred A, DO  metoprolol succinate (TOPROL-XL) 50 MG 24 hr tablet Take 50 mg by mouth daily. 05/13/21   [provider]  Multiple Vitamin (MULTIVITAMIN WITH MINERALS) TABS tablet Take 1 tablet by mouth daily.    [provider]  nitroGLYCERIN (NITROSTAT) 0.4 MG SL tablet Place 0.4 mg under the tongue every 5 (five) minutes as needed for chest pain. 09/01/20   [provider]  nystatin (MYCOSTATIN/NYSTOP) powder Apply three times a day under skin folds in groin Patient taking differently: Apply 1 application. topically 3 (three) times daily as needed (under skin folds in groin). 06/03/21   Loletha Grayer, MD  ondansetron (ZOFRAN-ODT) 4 MG disintegrating tablet Take 4 mg by mouth every 8 (eight) hours as needed. 11/23/21   [provider]  senna (SENOKOT) 8.6 MG tablet Take 2 tablets by mouth at bedtime.    [provider]  SPIRIVA HANDIHALER 18 MCG inhalation capsule Place 1 capsule into inhaler and  inhale daily as needed for shortness of breath. 08/09/21   [provider]  spironolactone (ALDACTONE) 25 MG tablet Take 12.5 mg by mouth daily. 09/05/21   [provider]  torsemide (DEMADEX) 20 MG tablet Take 20  mg by mouth daily. 08/31/21   [provider]    Scheduled Meds:  [START ON 01/04/2022] albuterol  2.5 mg Nebulization Q4H   fentaNYL (SUBLIMAZE) injection  50 mcg Intravenous Once   [START ON 01/04/2022] insulin aspart  0-9 Units Subcutaneous Q4H   [START ON 01/04/2022] pantoprazole sodium  40 mg Per Tube Daily   Continuous Infusions:  sodium chloride     sodium chloride     amiodarone 60 mg/hr (01/03/22 2223)   Followed by   Derrill Memo ON 01/04/2022] amiodarone     amiodarone     heparin 650 Units/hr (01/03/22 2219)   nitroGLYCERIN     norepinephrine (LEVOPHED) Adult infusion     propofol (DIPRIVAN) infusion 28.249 mcg/kg/min (01/03/22 2221)   PRN Meds:.albuterol, amiodarone, docusate sodium, nitroGLYCERIN, polyethylene glycol  Active Hospital Problem list     Assessment & Plan:   NSTEMI VFib Cardiac Arrest with ROSC after 1 round of CPR/Defibrillation Acute on chronic HFmrEF (recent Echo on 6/23 LVEF 55-60%) PMHx:CAD s/p CABG x2 in 2018 (patent LIMA to LAD,  occluded SVG to OM1 by [facility] 05/2021), h/o PCI x1 distal RCA (50% ISR by [facility] 05/2021), HLD, HTN -Continuous cardiac monitoring -Vasopressors as needed to maintain MAP goal  -Trend Lactic acid  -Trend HS Troponin until peaked  -Will resume Metoprolol & Imdur as bp permits -Continue Amiodarone gtt -Start Heparin gtt -DUAP therapy with aspirin and Plavix -Atorvastatin 80mg  PO daily -Cardiology consulted, discussed with STEMI on call MD Dr. Corky Sox who evaluated pt at the bedside with recs as above.  See Interventional cardiologist note for further details.   Acute Hypoxic Respiratory Failure in the setting of above -Wean PEEP and FiO2 for sats greater than 90% -Plateau pressures less than 30 cm  H20 -Con't LTVV, -VAP bundle in place -Intermittent chest x-ray & ABG -F/u cultures, trend PCT -wean sedation/analgesia for RASS goal 0  AKI on CKD stage III likely ATN in the setting of above BUN/Cr:32/2.29 -Monitor I&O's / urinary output -Follow BMP -Ensure adequate renal perfusion -Avoid nephrotoxic agents as able -Replace electrolytes as indicated   Acute Metabolic Encephalopathy ~ -Provide supportive care -Promote normal sleep/wake cycle -Avoid sedating meds as able -CT Head negative for acute intracranial abnormality   Diabetes mellitus HgbA1c 6.8 -CBG's AC & hs; Target range of 140 to 180 -SSI -Follow ICU Hypo/Hyperglycemia protocol  Acute on Chronic normocytic anemia Current Hgb 8.9 baseline 10 -Monitor for s/s bleeding while on heparin drip -Monitor H&H -Transfuse for hemoglobin<7   Best practice:  Diet:  NPO Pain/Anxiety/Delirium protocol (if indicated): Yes (RASS goal 0) VAP protocol (if indicated): Yes DVT prophylaxis: Systemic AC GI prophylaxis: PPI Glucose control:  SSI Yes Central venous access:  N/A Arterial line:  N/A Foley:  Yes, and it is still needed Mobility:  bed rest  PT consulted: N/A Last date of multidisciplinary goals of care discussion [7/12] Code Status:  full code Disposition: ICU   = Goals of Care = Code Status Order: FULL  Primary Emergency Contact: Behnke,Douglas Wishes to pursue full aggressive treatment and intervention options, including CPR and intubation, but goals of care will be addressed on going with family if that should become necessary.  Critical care time: 45 minutes     Rufina Falco, DNP, CCRN, FNP-C, AGACNP-BC Acute Care Nurse Practitioner  [facility] Pager: [phone] [place] [facility]

## 2022-01-03 NOTE — ED Notes (Addendum)
ET Tube pulled back to 20 at lip

## 2022-01-03 NOTE — Significant Event (Signed)
Cardiology Interventional Cardiology Note  Lindsey Pollard is an 81 year old female with history of severe CAD (CABG 2018 with occluded SVG grafts, patent LIMA), ischemic cardiomyopathy with EF recently 55%) hypertension, hyperlipidemia, type 2 diabetes, CKD stage III who presented with shortness of breath.  Sinus rhythm with mild ST depressions throughout.  Around 9 PM she developed chest pain at which time she had significant ST depressions noted throughout her precordium.  She was given nitroglycerin, pain medication, and aspirin, and shortly thereafter developed a VF arrest.  She received immediate CPR as well as defibrillation with return to VT, subsequently was synchronized cardioverted back to normal sinus rhythm and amiodarone was administered.  She was intubated for airway protection.  At the time of my evaluation she is hemodynamically stable.  Her EKG shows persistent diffuse ST depressions consistent with global ischemia, though overall improving.  This is a difficult situation, she is currently hemodynamically stable and recently has been admitted on multiple occasions with chest pain and acute coronary syndrome and managed medically out of patient preference.  Currently she has an acute kidney injury with a creatinine of 2.3, anemia with a hemoglobin of 8.9.  I called and discussed this with her nephew Loleta Books) who is her next of kin listed in the chart who is very reasonable.  I explained that heart catheterization in her is not particularly benign given her difficult access (occluded right femoral artery, severely diseased left femoral artery, no pulse in her left radial artery), acute kidney injury putting her at risk for need for dialysis long-term, acute respiratory failure which is not clearly in her goals of care, and severe anemia.  Additionally, I reviewed her heart catheterization from December, and she has severe underlying coronary disease with occluded SVG grafts, but patent RCA  and patent LIMA to LAD.  Heart catheterization certainly would be reasonable, though I am concerned that we may not find a potential culprit for her VF arrest even if completed now.  We ultimately discussed and decided upon medical management with DAPT, heparin, and amiodarone.  We will observe her closely and ultimately determine need for heart catheterization going forward.  Hopefully the patient can be extubated at some point to assist in this conversation.  Armando Reichert, MD

## 2022-01-04 ENCOUNTER — Inpatient Hospital Stay: Payer: Medicare Other

## 2022-01-04 ENCOUNTER — Other Ambulatory Visit: Payer: Self-pay

## 2022-01-04 ENCOUNTER — Inpatient Hospital Stay
Admit: 2022-01-04 | Discharge: 2022-01-04 | Disposition: A | Discharge status: Home / Self Care | Payer: Medicare Other | Attending: Cardiology | Admitting: Cardiology

## 2022-01-04 LAB — BASIC METABOLIC PANEL
Anion gap: 10 (ref 5–15)
BUN: 36 mg/dL — ABNORMAL HIGH (ref 8–23)
CO2: 25 mmol/L (ref 22–32)
Calcium: 8.3 mg/dL — ABNORMAL LOW (ref 8.9–10.3)
Chloride: 101 mmol/L (ref 98–111)
Creatinine, Ser: 2.05 mg/dL — ABNORMAL HIGH (ref 0.44–1.00)
GFR, Estimated: 24 mL/min — ABNORMAL LOW (ref >60)
Glucose, Bld: 145 mg/dL — ABNORMAL HIGH (ref 70–99)
Potassium: 2.9 mmol/L — ABNORMAL LOW (ref 3.5–5.1)
Sodium: 136 mmol/L (ref 135–145)

## 2022-01-04 LAB — HEPARIN LEVEL (UNFRACTIONATED)
Heparin Unfractionated: 0.32 IU/mL (ref 0.30–0.70)
Heparin Unfractionated: 0.34 IU/mL (ref 0.30–0.70)

## 2022-01-04 LAB — MAGNESIUM: Magnesium: 2.2 mg/dL (ref 1.7–2.4)

## 2022-01-04 LAB — CBC
HCT: 27.9 % — ABNORMAL LOW (ref 36.0–46.0)
Hemoglobin: 9.2 g/dL — ABNORMAL LOW (ref 12.0–15.0)
MCH: 31.4 pg (ref 26.0–34.0)
MCHC: 33 g/dL (ref 30.0–36.0)
MCV: 95.2 fL (ref 80.0–100.0)
Platelets: 193 10*3/uL (ref 150–400)
RBC: 2.93 MIL/uL — ABNORMAL LOW (ref 3.87–5.11)
RDW: 14.4 % (ref 11.5–15.5)
WBC: 10.3 10*3/uL (ref 4.0–10.5)
nRBC: 0 % (ref 0.0–0.2)

## 2022-01-04 LAB — POTASSIUM: Potassium: 4 mmol/L (ref 3.5–5.1)

## 2022-01-04 LAB — ECHOCARDIOGRAM LIMITED
Height: 59 in
S' Lateral: 3.2 cm
Weight: 2038.81 oz

## 2022-01-04 LAB — STREP PNEUMONIAE URINARY ANTIGEN: Strep Pneumo Urinary Antigen: NEGATIVE

## 2022-01-04 LAB — TRIGLYCERIDES: Triglycerides: 101 mg/dL (ref <150)

## 2022-01-04 LAB — GLUCOSE, CAPILLARY
Glucose-Capillary: 151 mg/dL — ABNORMAL HIGH (ref 70–99)
Glucose-Capillary: 120 mg/dL — ABNORMAL HIGH (ref 70–99)
Glucose-Capillary: 114 mg/dL — ABNORMAL HIGH (ref 70–99)
Glucose-Capillary: 104 mg/dL — ABNORMAL HIGH (ref 70–99)
Glucose-Capillary: 129 mg/dL — ABNORMAL HIGH (ref 70–99)
Glucose-Capillary: 115 mg/dL — ABNORMAL HIGH (ref 70–99)

## 2022-01-04 LAB — LACTIC ACID, PLASMA: Lactic Acid, Venous: 2.1 mmol/L (ref 0.5–1.9)

## 2022-01-04 LAB — PHOSPHORUS: Phosphorus: 4.9 mg/dL — ABNORMAL HIGH (ref 2.5–4.6)

## 2022-01-04 LAB — PROCALCITONIN
Procalcitonin: <0.1 ng/mL
Procalcitonin: <0.1 ng/mL

## 2022-01-04 LAB — T4, FREE: Free T4: 1.02 ng/dL (ref 0.61–1.12)

## 2022-01-04 LAB — TSH: TSH: 1.769 u[IU]/mL (ref 0.350–4.500)

## 2022-01-04 LAB — TROPONIN I (HIGH SENSITIVITY)
Troponin I (High Sensitivity): 5509 ng/L (ref <18)
Troponin I (High Sensitivity): 6377 ng/L (ref <18)
Troponin I (High Sensitivity): 6153 ng/L (ref <18)
Troponin I (High Sensitivity): 6002 ng/L (ref <18)

## 2022-01-04 LAB — MRSA NEXT GEN BY PCR, NASAL: MRSA by PCR Next Gen: NOT DETECTED

## 2022-01-04 MED ORDER — POTASSIUM CHLORIDE 20 MEQ PO PACK
20.0000 meq | PACK | ORAL | Status: AC
  Administered 2022-01-04 (×2): 20 meq
  Filled 2022-01-04 (×2): qty 1

## 2022-01-04 MED ORDER — FENTANYL CITRATE PF 50 MCG/ML IJ SOSY
50.0000 ug | PREFILLED_SYRINGE | Freq: Once | INTRAMUSCULAR | Status: AC
  Administered 2022-01-04: 50 ug via INTRAVENOUS

## 2022-01-04 MED ORDER — ORAL CARE MOUTH RINSE: 15.0000 mL | OROMUCOSAL | Status: DC | PRN

## 2022-01-04 MED ORDER — FREE WATER
30.0000 mL | Status: DC
  Administered 2022-01-04 – 2022-01-05 (×3): 30 mL

## 2022-01-04 MED ORDER — FREE WATER
30.0000 mL | Status: DC
  Administered 2022-01-04: 30 mL

## 2022-01-04 MED ORDER — ORAL CARE MOUTH RINSE
15.0000 mL | OROMUCOSAL | Status: DC
  Administered 2022-01-04 – 2022-01-05 (×7): 15 mL via OROMUCOSAL

## 2022-01-04 MED ORDER — ALBUTEROL SULFATE (2.5 MG/3ML) 0.083% IN NEBU
2.5000 mg | INHALATION_SOLUTION | Freq: Three times a day (TID) | RESPIRATORY_TRACT | Status: DC
  Administered 2022-01-04 – 2022-01-05 (×3): 2.5 mg via RESPIRATORY_TRACT
  Filled 2022-01-04 (×3): qty 3

## 2022-01-04 MED ORDER — ADULT MULTIVITAMIN LIQUID CH
15.0000 mL | Freq: Every day | ORAL | Status: DC
  Administered 2022-01-05: 15 mL
  Filled 2022-01-04: qty 15

## 2022-01-04 MED ORDER — PROSOURCE TF PO LIQD
45.0000 mL | Freq: Two times a day (BID) | ORAL | Status: DC
  Administered 2022-01-04 – 2022-01-05 (×2): 45 mL

## 2022-01-04 MED ORDER — VITAL HIGH PROTEIN PO LIQD
1000.0000 mL | ORAL | Status: DC
  Administered 2022-01-04: 1000 mL

## 2022-01-04 MED ORDER — CLOPIDOGREL BISULFATE 75 MG PO TABS
75.0000 mg | ORAL_TABLET | Freq: Every day | ORAL | Status: DC
  Administered 2022-01-04 – 2022-01-05 (×2): 75 mg
  Filled 2022-01-04 (×2): qty 1

## 2022-01-04 MED ORDER — POTASSIUM CHLORIDE 10 MEQ/100ML IV SOLN
10.0000 meq | INTRAVENOUS | Status: AC
  Administered 2022-01-04 (×3): 10 meq via INTRAVENOUS
  Filled 2022-01-04 (×3): qty 100

## 2022-01-04 MED ORDER — CHLORHEXIDINE GLUCONATE CLOTH 2 % EX PADS
6.0000 | MEDICATED_PAD | Freq: Every day | CUTANEOUS | Status: DC
  Administered 2022-01-04 – 2022-01-09 (×6): 6 via TOPICAL

## 2022-01-04 MED ORDER — ASPIRIN 81 MG PO CHEW
81.0000 mg | CHEWABLE_TABLET | Freq: Every day | ORAL | Status: DC
  Administered 2022-01-04 – 2022-01-05 (×2): 81 mg
  Filled 2022-01-04 (×2): qty 1

## 2022-01-04 MED ORDER — MIDAZOLAM HCL 2 MG/2ML IJ SOLN
1.0000 mg | INTRAMUSCULAR | Status: DC | PRN
  Administered 2022-01-04: 2 mg via INTRAVENOUS
  Filled 2022-01-04 (×2): qty 2

## 2022-01-04 MED FILL — Medication: Qty: 1 | Status: AC

## 2022-01-04 NOTE — Progress Notes (Signed)
*  PRELIMINARY RESULTS* Echocardiogram 2D Echocardiogram has been performed.  Lindsey Pollard 01/04/2022, 1:15 PM

## 2022-01-04 NOTE — IPAL (Addendum)
  Interdisciplinary Goals of Care Family Meeting   Date carried out: 01/03/2022  Location of the meeting: Phone conference  Member's involved: Nurse Practitioner and Family Member or next of kin  Durable Power of Attorney or acting medical decision maker: Loleta Books [employer])   Discussion:  Advance Care Planning/Goals of Care discussion was performed during the course of treatment to decide on type of care right for this patient following admission to the ICU.   I had a phone conference with patient's Francoise Schaumann named as the patient's surrogate decision maker to discuss goals of care in details following  change in patient's current status. Reviewed patient's labs, ABGs, vital signs including unstable HR (Vfib leading to brief cardiac arrest), diagnostic findings and overall guarded prognosis with the family and answered all their question.   Discussed prognosis, expected outcome with or without ongoing aggressive treatments and the options for interventions or de-escalation of care.   Diagnosis(es):NSTEMI, Vfib cardiac arrest & Acute hypoxic respiratory failure in the setting of cardiac arrest Prognosis: Guarded Code Status: FULL Disposition: Continue current acute care Next Steps:  Family understands the situation. They have consented and agreed to pursue full aggressive treatment and intervention options, including CPR and intubation for a reasonable period of time at which they would consider option for de-escalation of care.   Family are satisfied with Plan of action and management. All questions answered     Total Time Spent Face to Face addressing advance care planning in the presence of the Patient: 35 minutes  Code status: Full Code   Time spent for the meeting: 35 minutes    Webb Silversmith, DNP, CCRN, FNP-C, AGACNP-BC Acute Care & Family Nurse Practitioner  [facility]  See Amion for personal pager PCCM on call pager [phone] until 7  am

## 2022-01-04 NOTE — Consult Note (Addendum)
[facility] CLINIC CARDIOLOGY CONSULT NOTE       Patient ID: Lindsey Pollard MRN: [id number] DOB/AGE: 1940/08/05 81 y.o.  Admit date: 01/03/2022 Referring Physician Blair Hailey, NP  Primary Physician Dr. Einar Crow Primary Cardiologist Dr. 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 ED 01/03/2022 with shortness of breath.  Cardiology is consulted because she developed VF arrest while in the ED.    She has had multiple admissions over the past year for chest pain and has been medically managed due to patient preference.  Most recently admitted last month with unstable angina and was treated with dual antiplatelet therapy in addition to starting isosorbide. She she was seen in her regular cardiologist office on 7/11 where she was having some shortness of breath and chronic stable angina, and had actually not been able to continue isosorbide after discharge due to in availability at her pharmacy.  This was prescribed to her at that visit in addition to her other cardiac medicines.    She initially presented to the ED with shortness of breath and initial EKG showed sinus rhythm with mild ST depressions.  Around 9 PM while in the ED she developed chest pain and repeat EKG showed significant ST depressions in the precordial leads.  She was given nitroglycerin, pain meds, aspirin, shortly thereafter developed pulseless V-fib arrest.  She received immediate CPR and defibrillation with return to VT, was subsequently synchronously cardioverted back to normal sinus rhythm and IV amiodarone was administered.  She was intubated, and was hemodynamically stable overnight.  Emergent cardiac catheterization was deferred  after discussion with her next of kin (nephew) by the on-call interventional list who discussed the significant risks of bleeding, worsening AKI, and difficult vascular access.  Repeat EKG this morning showed resolution of her EKG changes, and is in sinus bradycardia on telemetry with heart rates in the 40s.  Amiodarone drip was turned off around 5 AM this morning for that reason. This morning she is intubated and sedated and unable to provide a history.   Vitals are notable for blood pressure 121/42, she is in sinus bradycardia with rates in the 50s on telemetry.  Her BNP is slightly elevated at 120.8, initial troponins were 17-17 on admission, repeat this morning at 8 AM was 5500.  Labs are notable for hypokalemia to 2.9, and AKI with creatinine 2.29, GFR 21 (at discharge on 6/16 was 1.39 and 38.  Negative procalcitonin and anemia with recent hemoglobin 9.2/27.8, platelets 193.   Past Medical History:  Diagnosis Date   Chest pain 12/06/2021   Diabetes mellitus without complication (HCC)    Hyperlipidemia    Hypertension    Myocardial infarction [facility])     Past Surgical History:  Procedure Laterality Date   CARDIAC SURGERY     bypass   CORONARY/GRAFT ACUTE MI REVASCULARIZATION N/A 06/01/2021   Procedure: Coronary/Graft Acute MI Revascularization;  Surgeon: Alwyn Pea, MD;  Location: [facility] INVASIVE CV LAB;  Service: Cardiovascular;  Laterality: N/A;   EYE SURGERY     cataract extraction   LEFT HEART CATH AND CORONARY ANGIOGRAPHY N/A 06/01/2021   Procedure: LEFT HEART CATH AND CORONARY ANGIOGRAPHY;  Surgeon: Yolonda Kida, MD;  Location: [facility] CV [facility];  Service: Cardiovascular;  Laterality: N/A;    Medications Prior to Admission  Medication Sig Dispense Refill Last Dose   acetaminophen (TYLENOL) 325 MG tablet Take 650 mg by mouth at bedtime.      albuterol (VENTOLIN HFA) 108 (90 Base) MCG/ACT inhaler Inhale 1-2 puffs into the lungs every 4 (four) hours as needed for shortness  of breath.      aspirin 81 MG EC tablet Take by mouth.      atorvastatin (LIPITOR) 80 MG tablet Take 80 mg by mouth every evening.      baclofen (LIORESAL) 10 MG tablet Take 10 mg by mouth 3 (three) times daily.      Cholecalciferol 25 MCG (1000 UT) capsule Take 1,000 Units by mouth 2 (two) times daily.      clopidogrel (PLAVIX) 75 MG tablet Take 1 tablet (75 mg total) by mouth daily. (Patient not taking: Reported on 10/03/2021) 30 tablet 0    insulin glargine (LANTUS) 100 UNIT/ML injection Inject 8 Units into the skin at bedtime.      insulin lispro (HUMALOG) 100 UNIT/ML KwikPen Inject 0-2 Units into the skin 3 (three) times daily.      ipratropium-albuterol (DUONEB) 0.5-2.5 (3) MG/3ML SOLN Take 3 mLs by nebulization every 6 (six) hours as needed (shortness of breath).      isosorbide mononitrate (IMDUR) 30 MG 24 hr tablet Take 1 tablet (30 mg total) by mouth daily. 30 tablet 1    lisinopril (ZESTRIL) 20 MG tablet Take 1 tablet (20 mg total) by mouth daily. 30 tablet 1    metoprolol succinate (TOPROL-XL) 50 MG 24 hr tablet Take 50 mg by mouth daily.      Multiple Vitamin (MULTIVITAMIN WITH MINERALS) TABS tablet Take 1 tablet by mouth daily.      nitroGLYCERIN (NITROSTAT) 0.4 MG SL tablet Place 0.4 mg under the tongue every 5 (five) minutes as needed for chest pain.      nystatin (MYCOSTATIN/NYSTOP) powder Apply three times a day under skin folds in groin (Patient taking differently: Apply 1 application. topically 3 (three) times daily as needed (under skin folds in groin).) 60 g 0    ondansetron (ZOFRAN-ODT) 4 MG disintegrating tablet Take 4 mg by mouth every 8 (eight) hours as needed.      senna (SENOKOT) 8.6 MG tablet Take 2 tablets by mouth at bedtime.      SPIRIVA HANDIHALER 18 MCG inhalation capsule Place 1 capsule into inhaler and inhale daily as needed for shortness of breath.      spironolactone (ALDACTONE) 25 MG tablet Take 12.5 mg by mouth daily.      torsemide (DEMADEX) 20 MG tablet  Take 20 mg by mouth daily.      Social History   Socioeconomic History   Marital status: Divorced    Spouse name: Not on file   Number of children: Not on file   Years of education: Not on file   Highest education level: Not on file  Occupational History   Not on file  Tobacco Use   Smoking status: Former    Types: Cigarettes   Smokeless tobacco: Never  Substance and Sexual Activity   Alcohol use: Not Currently   Drug use: Not Currently   Sexual activity: Not on file  Other Topics Concern   Not on file  Social History Narrative   Not on file   Social Determinants of Health   Financial  Resource Strain: Not on file  Food Insecurity: Not on file  Transportation Needs: Not on file  Physical Activity: Not on file  Stress: Not on file  Social Connections: Not on file  Intimate Partner Violence: Not on file    Family History  Problem Relation Age of Onset   Congestive Heart Failure Mother    Diabetes Father    Breast cancer Neg Hx       PHYSICAL EXAM General: Elderly and critically ill appearing Caucasian female, intubated and sedated in the ICU.   HEENT:  Normocephalic and atraumatic. Neck:  No JVD.  Lungs: Mechanically ventilated coarse breath sounds anteriorly. Heart: Bradycardic but regular, S1-S2 present without appreciable murmur. Abdomen: Non-distended appearing.  Msk: Normal strength and tone for age. Extremities: Warm and well perfused. No clubbing, cyanosis.  No peripheral edema.  Neuro: Intubated and sedated Psych: Intubated and sedated  Labs:   Lab Results  Component Value Date   WBC 10.3 01/04/2022   HGB 9.2 (L) 01/04/2022   HCT 27.9 (L) 01/04/2022   MCV 95.2 01/04/2022   PLT 193 01/04/2022    Recent Labs  Lab 01/03/22 2319 01/04/22 0514  NA 140 136  K 3.1* 2.9*  CL 101 101  CO2 26 25  BUN 35* 36*  CREATININE 2.06* 2.05*  CALCIUM 8.5* 8.3*  PROT 5.4*  --   BILITOT 1.2  --   ALKPHOS 111  --   ALT 146*  --   AST 245*  --   GLUCOSE  179* 145*   No results found for: "CKTOTAL", "CKMB", "CKMBINDEX", "TROPONINI"  Lab Results  Component Value Date   CHOL 102 12/07/2021   CHOL 127 06/01/2021   Lab Results  Component Value Date   HDL 31 (L) 12/07/2021   HDL 47 06/01/2021   Lab Results  Component Value Date   LDLCALC 45 12/07/2021   LDLCALC 49 06/01/2021   Lab Results  Component Value Date   TRIG 101 01/04/2022   TRIG 128 12/07/2021   TRIG 154 (H) 06/01/2021   Lab Results  Component Value Date   CHOLHDL 3.3 12/07/2021   CHOLHDL 2.7 06/01/2021   No results found for: "LDLDIRECT"    Radiology: DG Chest 1 View  Result Date: 01/04/2022 CLINICAL DATA:  81 year old female intubated.  Post CPR. EXAM: CHEST  1 VIEW COMPARISON:  Portable chest 01/03/2022 and earlier. FINDINGS: Portable AP semi upright view at 0611 hours. Endotracheal tube tip partially obscured by sternotomy hardware, but appears to be in good position between the level the clavicles and carina. Enteric tube courses into the abdomen, tip is not included. Stable pacer or resuscitation pads over the left chest. Stable lung volumes. Mediastinal contours remain normal. Allowing for portable technique the lungs are clear. No pneumothorax or pleural effusion identified. Negative visible bowel gas. Stable visualized osseous structures. IMPRESSION: 1. Satisfactory ET tube position. Enteric tube courses to the abdomen, tip not included. 2. No acute cardiopulmonary abnormality. Electronically Signed   By: Genevie Ann M.D.   On: 01/04/2022 07:26   DG Chest Portable 1 View  Result Date: 01/03/2022 CLINICAL DATA:  Intubation, post CPR EXAM: PORTABLE CHEST 1 VIEW COMPARISON:  01/03/2022 FINDINGS: Endotracheal tube is in the right mainstem bronchus. Recommend retracting approximately 5 cm. NG tube enters the stomach. Prior CABG. Heart is normal size. Increasing airspace disease throughout the left lung which may reflect atelectasis due to right mainstem intubation. Similar  right upper lobe opacity/atelectasis. No effusions. IMPRESSION: Right mainstem intubation.  Recommend retracting endotracheal tube approximately 5 cm. Areas of airspace disease in the left lung and right upper lobe may be related to atelectasis from right mainstem intubation. These results were called by telephone at the time of interpretation on 01/03/2022 at 10:21 pm to provider [facility] , who verbally acknowledged these results. Electronically Signed   By: Rolm Baptise M.D.   On: 01/03/2022 22:24   CT Cervical Spine Wo Contrast  Result Date: 01/03/2022 CLINICAL DATA:  Neck trauma (Age >= 65y) EXAM: CT CERVICAL SPINE WITHOUT CONTRAST TECHNIQUE: Multidetector CT imaging of the cervical spine was performed without intravenous contrast. Multiplanar CT image reconstructions were also generated. RADIATION DOSE REDUCTION: This exam was performed according to the departmental dose-optimization program which includes automated exposure control, adjustment of the mA and/or kV according to patient size and/or use of iterative reconstruction technique. COMPARISON:  None Available. FINDINGS: Alignment: Normal Skull base and vertebrae: No acute fracture. No primary bone lesion or focal pathologic process. Soft tissues and spinal canal: No prevertebral fluid or swelling. No visible canal hematoma. 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 Mild bilateral degenerative facet disease. Upper chest: No acute findings Other: None IMPRESSION: Degenerative disc and facet disease.  No acute bony abnormality. Electronically Signed   By: Rolm Baptise M.D.   On: 01/03/2022 20:10   CT HEAD WO CONTRAST (5MM)  Result Date: 01/03/2022 CLINICAL DATA:  Head trauma, minor (Age >= 65y) EXAM: CT HEAD WITHOUT CONTRAST TECHNIQUE: Contiguous axial images were obtained from the base of the skull through the vertex without intravenous contrast. RADIATION DOSE REDUCTION: This exam was  performed according to the departmental dose-optimization program which includes automated exposure control, adjustment of the mA and/or kV according to patient size and/or use of iterative reconstruction technique. COMPARISON:  10/03/2021 FINDINGS: Brain: No acute intracranial abnormality. Specifically, no hemorrhage, hydrocephalus, mass lesion, acute infarction, or significant intracranial injury. Vascular: No hyperdense vessel or unexpected calcification. Skull: No acute calvarial abnormality. Sinuses/Orbits: No acute findings Other: None IMPRESSION: Normal study for patient's age. Electronically Signed   By: Rolm Baptise M.D.   On: 01/03/2022 20:08   CT CHEST ABDOMEN PELVIS WO CONTRAST  Result Date: 01/03/2022 CLINICAL DATA:  Pneumonia, complication suspected, xray done. Shortness of breath. EXAM: CT CHEST, ABDOMEN AND PELVIS WITHOUT CONTRAST TECHNIQUE: Multidetector CT imaging of the chest, abdomen and pelvis was performed following the standard protocol without IV contrast. RADIATION DOSE REDUCTION: This exam was performed according to the departmental dose-optimization program which includes automated exposure control, adjustment of the mA and/or kV according to patient size and/or use of iterative reconstruction technique. COMPARISON:  None Available. FINDINGS: CT CHEST FINDINGS Cardiovascular: Heavily calcified aorta and coronary arteries. Prior CABG. Heart is normal size. Aorta is normal caliber. Mediastinum/Nodes: No mediastinal, hilar, or axillary adenopathy. Trachea and esophagus are unremarkable. Thyroid unremarkable. Lungs/Pleura: Lungs are clear. No focal airspace opacities or suspicious nodules. No effusions. Musculoskeletal: Chest wall soft tissues are unremarkable. No acute bony abnormality. CT ABDOMEN PELVIS FINDINGS Hepatobiliary: Small gallstone within the gallbladder. No focal hepatic abnormality. Pancreas: No focal abnormality or ductal dilatation. Spleen: No focal abnormality.  Normal  size. Adrenals/Urinary Tract: Left kidney is atrophic. No renal or adrenal mass. No stones or hydronephrosis. Urinary bladder unremarkable. Stomach/Bowel: Left colonic diverticulosis. No active diverticulitis. Stomach and small bowel decompressed, unremarkable. Vascular/Lymphatic: Heavily calcified aorta. No evidence of aneurysm or adenopathy. Reproductive: Calcified fibroids in the uterus.  No adnexal masses. Other:  No free fluid or free air. Musculoskeletal: No acute bony abnormality. Scoliosis and degenerative changes in the lumbar spine. IMPRESSION: No acute cardiopulmonary disease. Diffuse aortoiliac atherosclerosis.  No aneurysm. Cholelithiasis. Left colonic diverticulosis.  No active diverticulitis. No acute findings in the abdomen or pelvis. Electronically Signed   By: Rolm Baptise M.D.   On: 01/03/2022 20:05   DG Chest 2 View  Result Date: 01/03/2022 CLINICAL DATA:  Shortness of breath EXAM: CHEST - 2 VIEW COMPARISON:  12/06/2021 FINDINGS: Prior CABG. Heart and mediastinal contours are within normal limits. No focal opacities or effusions. No acute bony abnormality. IMPRESSION: No active cardiopulmonary disease. Electronically Signed   By: Rolm Baptise M.D.   On: 01/03/2022 19:25   ECHOCARDIOGRAM COMPLETE  Result Date: 12/07/2021    ECHOCARDIOGRAM REPORT   Patient Name:   Lindsey Pollard [facility] Date of Exam: 12/07/2021 Medical Rec #:  [id number]     Height:       59.0 in Accession #:    [id number]    Weight:       126.5 lb Date of Birth:  [birth year]     BSA:          1.518 m Patient Age:    22 years      BP:           123/32 mmHg Patient Gender: F             HR:           56 bpm. Exam Location:  [facility] Procedure: 2D Echo, Cardiac Doppler and Color Doppler Indications:     Chest pain R07.9  History:         Patient has prior history of Echocardiogram examinations, most                  recent 06/01/2021. Previous Myocardial Infarction, Prior CABG;                  Risk Factors:Hypertension, Dyslipidemia and  Diabetes.  Sonographer:     Sherrie Sport Referring Phys:  [id number] [place] TANG Diagnosing Phys: Yolonda Kida MD IMPRESSIONS  1. Left ventricular ejection fraction, by estimation, is 55 to 60%. The left ventricle has normal function. The left ventricle has no regional wall motion abnormalities. Left ventricular diastolic parameters are consistent with Grade I diastolic dysfunction (impaired relaxation).  2. Right ventricular systolic function is normal. The right ventricular size is normal.  3. The mitral valve is normal in structure. Moderate mitral valve regurgitation.  4. The aortic valve is normal in structure. Aortic valve regurgitation is mild. FINDINGS  Left Ventricle: Left ventricular ejection fraction, by estimation, is 55 to 60%. The left ventricle has normal function. The left ventricle has no regional wall motion abnormalities. The left ventricular internal cavity size was normal in size. There is  no left ventricular hypertrophy. Left ventricular diastolic parameters are consistent with Grade I diastolic dysfunction (impaired relaxation). Right Ventricle: The right ventricular size is normal. No increase in right ventricular wall thickness. Right ventricular systolic function is normal. Left Atrium: Left atrial size was normal in size. Right Atrium: Right atrial size was normal in size. Pericardium: There is no evidence of pericardial effusion. Mitral Valve: The mitral valve is normal in structure. Moderate mitral valve regurgitation. MV peak gradient, 4.8 mmHg. The mean mitral valve gradient is 1.0 mmHg. Tricuspid Valve: The tricuspid valve is grossly normal. Tricuspid valve regurgitation is mild. Aortic Valve: The aortic valve is normal in  structure. Aortic valve regurgitation is mild. Aortic regurgitation PHT measures 567 msec. Aortic valve mean gradient measures 10.0 mmHg. Aortic valve peak gradient measures 16.9 mmHg. Aortic valve area, by VTI  measures 1.12 cm. Pulmonic Valve: The  pulmonic valve was normal in structure. Pulmonic valve regurgitation is not visualized. Aorta: The ascending aorta was not well visualized. IAS/Shunts: No atrial level shunt detected by color flow Doppler.  LEFT VENTRICLE PLAX 2D LVIDd:         4.40 cm   Diastology LVIDs:         3.10 cm   LV e' medial:    6.96 cm/s LV PW:         1.30 cm   LV E/e' medial:  11.2 LV IVS:        0.75 cm   LV e' lateral:   7.40 cm/s LVOT diam:     2.00 cm   LV E/e' lateral: 10.5 LV SV:         60 LV SV Index:   39 LVOT Area:     3.14 cm  RIGHT VENTRICLE RV Basal diam:  3.50 cm RV S prime:     10.60 cm/s TAPSE (M-mode): 1.5 cm LEFT ATRIUM             Index        RIGHT ATRIUM           Index LA diam:        3.20 cm 2.11 cm/m   RA Area:     12.70 cm LA Vol (A2C):   63.1 ml 41.56 ml/m  RA Volume:   29.50 ml  19.43 ml/m LA Vol (A4C):   46.0 ml 30.30 ml/m LA Biplane Vol: 53.3 ml 35.10 ml/m  AORTIC VALVE                     PULMONIC VALVE AV Area (Vmax):    1.15 cm      PV Vmax:        0.88 m/s AV Area (Vmean):   0.99 cm      PV Vmean:       57.400 cm/s AV Area (VTI):     1.12 cm      PV VTI:         0.194 m AV Vmax:           205.33 cm/s   PV Peak grad:   3.1 mmHg AV Vmean:          148.333 cm/s  PV Mean grad:   2.0 mmHg AV VTI:            0.532 m       RVOT Peak grad: 4 mmHg AV Peak Grad:      16.9 mmHg AV Mean Grad:      10.0 mmHg LVOT Vmax:         75.10 cm/s LVOT Vmean:        46.600 cm/s LVOT VTI:          0.190 m LVOT/AV VTI ratio: 0.36 AI PHT:            567 msec  AORTA Ao Root diam: 2.80 cm MITRAL VALVE                TRICUSPID VALVE MV Area (PHT): 2.26 cm     TR Peak grad:   38.2 mmHg MV Area VTI:   1.74 cm     TR Vmax:  309.00 cm/s MV Peak grad:  4.8 mmHg MV Mean grad:  1.0 mmHg     SHUNTS MV Vmax:       1.09 m/s     Systemic VTI:  0.19 m MV Vmean:      47.4 cm/s    Systemic Diam: 2.00 cm MV Decel Time: 335 msec     Pulmonic VTI:  0.220 m MV E velocity: 78.00 cm/s MV A velocity: 105.00 cm/s MV E/A ratio:  0.74  Dwayne D Callwood MD Electronically signed by Yolonda Kida MD Signature Date/Time: 12/07/2021/1:24:31 PM    Final    CT Angio Chest PE W/Cm &/Or Wo Cm  Result Date: 12/06/2021 CLINICAL DATA:  Pulmonary embolism (PE) suspected, high prob EXAM: CT ANGIOGRAPHY CHEST WITH CONTRAST TECHNIQUE: Multidetector CT imaging of the chest was performed using the standard protocol during bolus administration of intravenous contrast. Multiplanar CT image reconstructions and MIPs were obtained to evaluate the vascular anatomy. RADIATION DOSE REDUCTION: This exam was performed according to the departmental dose-optimization program which includes automated exposure control, adjustment of the mA and/or kV according to patient size and/or use of iterative reconstruction technique. CONTRAST:  25mL OMNIPAQUE IOHEXOL 350 MG/ML SOLN COMPARISON:  None Available. FINDINGS: Cardiovascular: Satisfactory opacification of the pulmonary arteries to the segmental level. No evidence of pulmonary embolism. Mild cardiomegaly. No pericardial disease. Prior CABG. Extensive native coronary artery calcification. Reflux of contrast into the IVC and hepatic veins. Moderate atherosclerosis of the thoracic aorta. Mediastinum/Nodes: Mildly enlarged precarinal lymph node measuring 1.1 cm, likely reactive. No axillary adenopathy. No hilar adenopathy. The thyroid is unremarkable. Small hiatal hernia. Lungs/Pleura: There is interlobular septal thickening and ground-glass opacities bilaterally. No airspace consolidation. There is a 4 mm pulmonary nodule in the right lung base (series 5, image 94). Upper Abdomen: No acute abnormality. Musculoskeletal: No chest wall abnormality. No acute or significant osseous findings. Review of the MIP images confirms the above findings. IMPRESSION: No evidence of pulmonary embolism.  Mild pulmonary edema. 4 mm pulmonary nodule in the right lung base (series 5, image 94). No routine follow-up imaging is recommended per  Fleischner guidelines. These guidelines do not apply to immunocompromised patients and patients with cancer. Follow up in patients with significant comorbidities as clinically warranted. Reference: Radiology. 2017; 284(1):228-43. Electronically Signed   By: Maurine Simmering M.D.   On: 12/06/2021 09:54   DG Chest 2 View  Result Date: 12/06/2021 CLINICAL DATA:  81 year old female with history of chest pain. EXAM: CHEST - 2 VIEW COMPARISON:  Chest x-ray 06/01/2021. FINDINGS: There is cephalization of the pulmonary vasculature and slight indistinctness of the interstitial markings suggestive of mild pulmonary edema. No pleural effusions. Mild cardiomegaly. Upper mediastinal contours are within normal limits. Atherosclerotic calcifications are noted in the thoracic aorta. Status post median sternotomy. IMPRESSION: 1. The appearance the chest suggests mild congestive heart failure, as above. 2. Aortic atherosclerosis. Electronically Signed   By: Vinnie Langton M.D.   On: 12/06/2021 09:04     TELEMETRY reviewed by me: Currently sinus bradycardia with rate 40s.  ASSESSMENT AND PLAN:  Alina Flash is an 28yoF with a PMH of CAD s/p CABG x2 in 2018 (patent LIMA to LAD,  occluded SVG to OM1 by [facility] 05/2021), h/o PCI x1 distal RCA (50% ISR by [facility] 05/2021), ischemic CM with LVEF 55-60%, g1dd, mod MR 11/2021, CKD 3, hyperlipidemia, type 2 diabetes, CAD, GERD who presented to [facility] ED 01/03/2022 with shortness of breath.  Cardiology is consulted because she developed  VF arrest while in the ED.   #VF arrest with successful resuscitation s/p 1 round of CPR and defibrillation #NSTEMI with history of significant CAD s/p CABG x2 with known occluded SVG to OM1, LIMA to LAD patent by [facility] 05/2021 #Ischemic cardiomyopathy #Acute hypoxic respiratory failure -Agree with current therapy and appreciate ventilator management per primary team  -S/p 325 mg aspirin, 300 mg clopidogrel, continue DAPT with 81 mg aspirin and 75 mg clopidogrel  daily -Continue atorvastatin 80 mg daily -Continue heparin drip for 48 hours. -S/p amiodarone drip, discontinued this morning due to bradycardia -Restart metoprolol and isosorbide as blood pressure permits -Limited echo -Discussed with the on-call cardiologist Dr. Gwen Pounds who does not recommend heart catheterization at this time due to difficulties with vascular access, risk of worsening AKI, and ongoing anemia. -We will continue medical management with the above, hopefully the patient will be able to wean from ventilator and and participate in further discussions   This patient's plan of care was discussed and created with Dr. Gwen Pounds and he is in agreement.  Signed: Rebeca Allegra , PA-C 01/04/2022, 9:45 AM [facility]  The patient has an acute non-ST elevation myocardial infarction for which her previous cardiac catheterization shows no amenable PCI or intervention.  Therefore medical management will be appropriate.  Currently the patient has had improved oxygenation and improved blood pressure without evidence of need for pressors at this time.  Heart rate has been slowly improving in the 50 bpm range.  We will consider the possibility of addition of other medication management after improvements of blood pressure further  The patient has been interviewed and examined. I agree with assessment and plan above. Arnoldo Hooker MD [facility]                 Results for orders placed or performed during the hospital encounter of 01/03/22  Culture, blood (Routine X 2) w Reflex to ID Panel   Specimen: BLOOD  Result Value Ref Range   Specimen Description BLOOD LEFT FOREARM    Special Requests      BOTTLES DRAWN AEROBIC AND ANAEROBIC Blood Culture adequate volume   Culture      NO GROWTH < 12 HOURS Performed at [facility], [address] [place], [place] [address]    Report Status PENDING   MRSA Next Gen by PCR, Nasal   Specimen: Nasal Mucosa;  Nasal Swab  Result Value Ref Range   MRSA by PCR Next Gen NOT DETECTED NOT DETECTED  Basic metabolic panel  Result Value Ref Range   Sodium 138 135 - 145 mmol/L   Potassium 3.5 3.5 - 5.1 mmol/L   Chloride 104 98 - 111 mmol/L   CO2 24 22 - 32 mmol/L   Glucose, Bld 153 (H) 70 - 99 mg/dL   BUN 32 (H) 8 - 23 mg/dL   Creatinine, Ser 1.91 (H) 0.44 - 1.00 mg/dL   Calcium 8.2 (L) 8.9 - 10.3 mg/dL   GFR, Estimated 21 (L) >60 mL/min   Anion gap 10 5 - 15  CBC  Result Value Ref Range   WBC 8.1 4.0 - 10.5 K/uL   RBC 2.88 (L) 3.87 - 5.11 MIL/uL   Hemoglobin 8.9 (L) 12.0 - 15.0 g/dL   HCT 47.8 (L) 29.5 - 62.1 %   MCV 97.2 80.0 - 100.0 fL   MCH 30.9 26.0 - 34.0 pg   MCHC 31.8 30.0 - 36.0 g/dL   RDW 30.8 65.7 - 84.6 %  Platelets 239 150 - 400 K/uL   nRBC 0.0 0.0 - 0.2 %  Brain natriuretic peptide  Result Value Ref Range   B Natriuretic Peptide 120.8 (H) 0.0 - 100.0 pg/mL  Blood gas, venous  Result Value Ref Range   pH, Ven 7.34 7.25 - 7.43   pCO2, Ven 47 44 - 60 mmHg   pO2, Ven 37 32 - 45 mmHg   Bicarbonate 25.4 20.0 - 28.0 mmol/L   Acid-base deficit 0.8 0.0 - 2.0 mmol/L   O2 Saturation 59.8 %   Patient temperature 37.0    Collection site VENOUS    Drawn by VENOUS   Lactic acid, plasma  Result Value Ref Range   Lactic Acid, Venous 2.2 (HH) 0.5 - 1.9 mmol/L  Magnesium  Result Value Ref Range   Magnesium 2.1 1.7 - 2.4 mg/dL  Urinalysis, Complete w Microscopic  Result Value Ref Range   Color, Urine YELLOW (A) YELLOW   APPearance CLEAR (A) CLEAR   Specific Gravity, Urine 1.011 1.005 - 1.030   pH 5.0 5.0 - 8.0   Glucose, UA NEGATIVE NEGATIVE mg/dL   Hgb urine dipstick NEGATIVE NEGATIVE   Bilirubin Urine NEGATIVE NEGATIVE   Ketones, ur NEGATIVE NEGATIVE mg/dL   Protein, ur 30 (A) NEGATIVE mg/dL   Nitrite NEGATIVE NEGATIVE   Leukocytes,Ua NEGATIVE NEGATIVE   RBC / HPF 0-5 0 - 5 RBC/hpf   WBC, UA 0-5 0 - 5 WBC/hpf   Bacteria, UA NONE SEEN NONE SEEN   Squamous Epithelial / LPF  0-5 0 - 5   Hyaline Casts, UA PRESENT   D-dimer, quantitative  Result Value Ref Range   D-Dimer, Quant 2.11 (H) 0.00 - 0.50 ug/mL-FEU  Protime-INR  Result Value Ref Range   Prothrombin Time 13.9 11.4 - 15.2 seconds   INR 1.1 0.8 - 1.2  APTT  Result Value Ref Range   aPTT 29 24 - 36 seconds  Procalcitonin  Result Value Ref Range   Procalcitonin <0.10 ng/mL  CBC  Result Value Ref Range   WBC 10.3 4.0 - 10.5 K/uL   RBC 2.93 (L) 3.87 - 5.11 MIL/uL   Hemoglobin 9.2 (L) 12.0 - 15.0 g/dL   HCT 81.0 (L) 17.5 - 10.2 %   MCV 95.2 80.0 - 100.0 fL   MCH 31.4 26.0 - 34.0 pg   MCHC 33.0 30.0 - 36.0 g/dL   RDW 58.5 27.7 - 82.4 %   Platelets 193 150 - 400 K/uL   nRBC 0.0 0.0 - 0.2 %  Heparin level (unfractionated)  Result Value Ref Range   Heparin Unfractionated 0.32 0.30 - 0.70 IU/mL  Triglycerides  Result Value Ref Range   Triglycerides 101 <150 mg/dL  Comprehensive metabolic panel  Result Value Ref Range   Sodium 140 135 - 145 mmol/L   Potassium 3.1 (L) 3.5 - 5.1 mmol/L   Chloride 101 98 - 111 mmol/L   CO2 26 22 - 32 mmol/L   Glucose, Bld 179 (H) 70 - 99 mg/dL   BUN 35 (H) 8 - 23 mg/dL   Creatinine, Ser 2.35 (H) 0.44 - 1.00 mg/dL   Calcium 8.5 (L) 8.9 - 10.3 mg/dL   Total Protein 5.4 (L) 6.5 - 8.1 g/dL   Albumin 3.4 (L) 3.5 - 5.0 g/dL   AST 361 (H) 15 - 41 U/L   ALT 146 (H) 0 - 44 U/L   Alkaline Phosphatase 111 38 - 126 U/L   Total Bilirubin 1.2 0.3 - 1.2 mg/dL  GFR, Estimated 24 (L) >60 mL/min   Anion gap 13 5 - 15  Lipase, blood  Result Value Ref Range   Lipase 39 11 - 51 U/L  Lactic acid, plasma  Result Value Ref Range   Lactic Acid, Venous 2.6 (HH) 0.5 - 1.9 mmol/L  Lactic acid, plasma  Result Value Ref Range   Lactic Acid, Venous 2.1 (HH) 0.5 - 1.9 mmol/L  Procalcitonin - Baseline  Result Value Ref Range   Procalcitonin <0.10 ng/mL  Procalcitonin  Result Value Ref Range   Procalcitonin <0.10 ng/mL  Strep pneumoniae urinary antigen (not at [facility])  Result  Value Ref Range   Strep Pneumo Urinary Antigen NEGATIVE NEGATIVE  Phosphorus  Result Value Ref Range   Phosphorus 4.9 (H) 2.5 - 4.6 mg/dL  Magnesium  Result Value Ref Range   Magnesium 2.2 1.7 - 2.4 mg/dL  Basic metabolic panel  Result Value Ref Range   Sodium 136 135 - 145 mmol/L   Potassium 2.9 (L) 3.5 - 5.1 mmol/L   Chloride 101 98 - 111 mmol/L   CO2 25 22 - 32 mmol/L   Glucose, Bld 145 (H) 70 - 99 mg/dL   BUN 36 (H) 8 - 23 mg/dL   Creatinine, Ser 2.05 (H) 0.44 - 1.00 mg/dL   Calcium 8.3 (L) 8.9 - 10.3 mg/dL   GFR, Estimated 24 (L) >60 mL/min   Anion gap 10 5 - 15  Glucose, capillary  Result Value Ref Range   Glucose-Capillary 151 (H) 70 - 99 mg/dL  TSH  Result Value Ref Range   TSH 1.769 0.350 - 4.500 uIU/mL  T4, free  Result Value Ref Range   Free T4 1.02 0.61 - 1.12 ng/dL  Heparin level (unfractionated)  Result Value Ref Range   Heparin Unfractionated 0.34 0.30 - 0.70 IU/mL  Glucose, capillary  Result Value Ref Range   Glucose-Capillary 120 (H) 70 - 99 mg/dL  Potassium  Result Value Ref Range   Potassium 4.0 3.5 - 5.1 mmol/L  Glucose, capillary  Result Value Ref Range   Glucose-Capillary 114 (H) 70 - 99 mg/dL  Glucose, capillary  Result Value Ref Range   Glucose-Capillary 104 (H) 70 - 99 mg/dL  CBG monitoring, ED  Result Value Ref Range   Glucose-Capillary 159 (H) 70 - 99 mg/dL  ECHOCARDIOGRAM LIMITED  Result Value Ref Range   Weight 2,038.81 oz   Height 59 in   BP 128/50 mmHg  Troponin I (High Sensitivity)  Result Value Ref Range   Troponin I (High Sensitivity) 17 <18 ng/L  Troponin I (High Sensitivity)  Result Value Ref Range   Troponin I (High Sensitivity) 17 <18 ng/L  Troponin I (High Sensitivity)  Result Value Ref Range   Troponin I (High Sensitivity) 5,509 (HH) <18 ng/L  Troponin I (High Sensitivity)  Result Value Ref Range   Troponin I (High Sensitivity) 6,377 (HH) <18 ng/L

## 2022-01-04 NOTE — Consult Note (Signed)
ANTICOAGULATION CONSULT NOTE  Pharmacy Consult for IV Heparin Indication: chest pain/ACS  Patient Measurements: Height: 4\' 11"  (149.9 cm) Weight: 57.8 kg (127 lb 6.8 oz) IBW/kg (Calculated) : 43.2 Heparin Dosing Weight: 55.5 kg  Labs: Recent Labs    01/03/22 1826 01/03/22 1945 01/03/22 2319 01/04/22 0514 01/04/22 0820 01/04/22 1421  HGB 8.9*  --   --  9.2*  --   --   HCT 28.0*  --   --  27.9*  --   --   PLT 239  --   --  193  --   --   APTT  --  29  --   --   --   --   LABPROT  --  13.9  --   --   --   --   INR  --  1.1  --   --   --   --   HEPARINUNFRC  --   --   --  0.32  --  0.34  CREATININE 2.29*  --  2.06* 2.05*  --   --   TROPONINIHS 17 17  --   --  5,509*  --      Estimated Creatinine Clearance: 16.9 mL/min (A) (by C-G formula based on SCr of 2.05 mg/dL (H)).   Medical History: Past Medical History:  Diagnosis Date   Chest pain 12/06/2021   Diabetes mellitus without complication (HCC)    Hyperlipidemia    Hypertension    Myocardial infarction [facility])     Medications:  No anticoagulation prior to admission per my chart review  Assessment: Patient is an 81 y/o F w/ h/o CAD (s/p CABG x2 2018), HTN, HLD, DM, PAD, CHF (EF 45-50%>55-60%), CKD-3a, and RLS, who presents to the ED with SOB worse w/ exertion w/ no associated symptoms. In the ED pt went into Vfib arrest w/ ROSC after 1 round of CPR/Defib. There is concern for acute coronary syndrome and pharmacy consulted to initiate and manage heparin infusion.  Baseline CBC notable for anemia with Hgb 8.9 which is overall consistent with patient's usual range. Baseline aPTT and PT-INR are within normal limits.  Goal of Therapy:  Heparin level 0.3-0.7 units/ml Monitor platelets by anticoagulation protocol: Yes   7/13 0514 HL 0.32, therapeutic x1 7/13 1421 HL 0.34; therapeutic x2  Plan:  Consecutively therapeutic HL; will change to daily monitoring. Continue heparin infusion at 650 units/hr Recheck HL w/ 0500  AM labs Daily CBC per protocol while on IV heparin  8/13, PharmD, [facility] Clinical Pharmacist 01/04/2022 3:22 PM

## 2022-01-04 NOTE — Progress Notes (Signed)
Initial Nutrition Assessment  DOCUMENTATION CODES:   Not applicable  INTERVENTION:   Vital HP @30ml /hr + ProSource TF 33ml BID via tube   Propofol: 17.7 ml/hr- provides 467kcal/day   Free water flushes 43ml q4 hours to maintain tube patency   Regimen provides 1267kcal/day, 85g/day protein and 772ml/day of free water  Liquid MVI daily via tube   Pt at high refeed risk; recommend monitor potassium, magnesium and phosphorus labs daily until stable  NUTRITION DIAGNOSIS:   Moderate Malnutrition related to chronic illness as evidenced by percent weight loss, mild fat depletion, moderate fat depletion, moderate muscle depletion, severe muscle depletion.  GOAL:   Provide needs based on ASPEN/SCCM guidelines  MONITOR:   Vent status, Labs, Weight trends, Skin, TF tolerance, I & O's  REASON FOR ASSESSMENT:   Ventilator    ASSESSMENT:   81 y/o female with h/o CAD s/p CABG x 2, PVD, GERD, CKD III, MI, DM, CHF, HTN, HLD and recent fall who is admitted s/p VFib cardiac arrest.  Pt sedated and ventilated. OGT in place. Plan is to start tube feeds today. Pt is at high refeed risk. Per chart, pt is down 18lbs(13%) over the past 7 months; this is significant weight loss. RD suspects pt with decreased oral intake pta.   Medications reviewed and include: aspirin, plavix, insulin, protonix, propofol  Labs reviewed: K 2.9(L), BUN 36(H), creat 2.05(H), P 4.9(H), Mg 2.2 wnl Hgb 9.2(L), Hct 27.9(L) Cbgs- 114, 120, 151 x 24 hrs AIC 6.8(H)- 6/14  Patient is currently intubated on ventilator support MV: 7.0 L/min Temp (24hrs), Avg:96.4 F (35.8 C), Min:95.4 F (35.2 C), Max:98.4 F (36.9 C)  Propofol: 17.7 ml/hr- provides 467kcal/day   NUTRITION - FOCUSED PHYSICAL EXAM:  Flowsheet Row Most Recent Value  Orbital Region Mild depletion  Upper Arm Region Moderate depletion  Thoracic and Lumbar Region No depletion  Buccal Region No depletion  Temple Region Mild depletion  Clavicle  Bone Region Moderate depletion  Clavicle and Acromion Bone Region Moderate depletion  Scapular Bone Region Moderate depletion  Dorsal Hand Unable to assess  Patellar Region Moderate depletion  Anterior Thigh Region Moderate depletion  Posterior Calf Region Moderate depletion  Edema (RD Assessment) None  Hair Reviewed  Eyes Reviewed  Mouth Reviewed  Skin Reviewed  Nails Reviewed   Diet Order:   Diet Order             Diet NPO time specified  Diet effective now                  EDUCATION NEEDS:   No education needs have been identified at this time  Skin:  Skin Assessment: Reviewed RN Assessment (ecchymosis)  Last BM:  pta  Height:   Ht Readings from Last 1 Encounters:  01/04/22 4\' 11"  (1.499 m)    Weight:   Wt Readings from Last 1 Encounters:  01/04/22 57.8 kg    Ideal Body Weight:  44.5 kg  BMI:  Body mass index is 25.74 kg/m.  Estimated Nutritional Needs:   Kcal:  1000-1200kcal/day  Protein:  80-90g/day  Fluid:  1.2-1.4L/day  MS, RD, LDN Please refer to [facility] for RD and/or RD on-call/weekend/after hours pager

## 2022-01-04 NOTE — Consult Note (Signed)
PHARMACY CONSULT NOTE - FOLLOW UP  Pharmacy Consult for Electrolyte Monitoring and Replacement   Recent Labs: Potassium (mmol/L)  Date Value  01/04/2022 2.9 (L)   Magnesium (mg/dL)  Date Value  24/46/2863 2.2   Calcium (mg/dL)  Date Value  81/77/1165 8.3 (L)   Albumin (g/dL)  Date Value  79/08/8331 3.4 (L)   Phosphorus (mg/dL)  Date Value  83/29/1916 4.9 (H)   Sodium (mmol/L)  Date Value  01/04/2022 136   Assessment: 81yo Female w/ h/o CAD (s/p CABG x2 2018), HTN, HLD, DM, PAD, CHF (EF 45-50%>55-60%), CKD-3a, and RLS, who presents to the ED with SOB worse w/ exertion w/ no associated symptoms. In the ED pt went into Vfib arrest w/ ROSC after 1 round of CPR/Defib. Patient reported a fall 3 days ago hitting the side of her head without LOC. Pharmacy consulted for mgmt of electrolytes.  Goal of Therapy:  Lytes WNL K>4 & Mg >2  Plan:  Scr 2.29>2.05 (BL 1.3-1.5) K: 3.5>3.1>2.9 replacing with KCL PO q4h x2 doses & KCL IV q1h x3 (TDD ). F/u Afternoon 1600 level since requiring aggressive repletion. Mg 2.1>2.2: CTM, WNL no repletion at this time. Next full set of labs in AM 0500  Martyn Malay ,PharmD Clinical Pharmacist 01/04/2022 9:57 AM

## 2022-01-04 NOTE — Plan of Care (Signed)
  Problem: Health Behavior/Discharge Planning: Goal: Ability to manage health-related needs will improve Outcome: Progressing   Problem: Clinical Measurements: Goal: Will remain free from infection Outcome: Progressing   Problem: Nutritional: Goal: Progress toward achieving an optimal weight will improve Outcome: Progressing   Problem: Respiratory: Goal: Ability to maintain a clear airway and adequate ventilation will improve Outcome: Progressing

## 2022-01-04 NOTE — Progress Notes (Signed)
NAME:  Lindsey Pollard, MRN:  [id number], DOB:  1941/01/12, LOS: 1 ADMISSION DATE:  01/03/2022, CONSULTATION DATE:/05/2022 REFERRING MD: Hulan Saas, MD, CHIEF COMPLAINT: Shortness of breath/chest pain   HPI  81 y.o female with medical history significant of CAD, s/p of CABG, HTN, HLD, DM, PAD, CHF with EF 45-50%, CKD-3a, and RLS, who presents  to the ED with chief complaints of shortness of breath worse with exertion with no associated symptoms.  Patient reported a fall 3 days ago hitting the side of her head without LOC.  ED Course: Initial vitals int he ED; the temperature was 36.9C, the heart rate 57 beats/minute, the blood pressure 126/33  mm Hg, the respiratory rate 26 breaths/minute, and the oxygen saturation 100% on RA. Patient's noted with increased work of breathing.  She was placed on 2 L via [place] with improvement in her sats.  At around 9 pm, patient started complaining of right-sided chest pain rating 10/10 without other associated symptoms.  She received 1 sublingual nitro with no improvement in her pain.  Shortly after patient went into V-fib arrest without a pulse.  CPR/ACLS initiated, patient received Epi x1 dose and immediately began moving her arms and groaning  with monitor showing V-tach at rate  of 155 bmp with a pulse.  Patient was cardioverted at 200 J back to NSR and 300 mg of amiodarone bolus administered followed by infusion.  Patient was intubated for airway protection.  Post cardiac arrest EKG was obtained and showed persistent diffuse ST depression.  STEMI on-call MD was notified who evaluated patient at the bedside.  PCCM consulted for admission to the ICU.   -01/04/22- patient had episodes of severe aggitation and ventilator dyssynchrony, she was sedated to RASS-1 with adeqate response and improved pulmonary parameters on MV.   Past Medical History  CAD, s/p of CABG, HTN, HLD, DM, PAD, CHF with EF 45-50%, CKD-3a, and RLS  Significant Hospital Events   7/12: Mated to  ICU with NSTEMI status post V-fib cardiac arrest  Consults:  Cardiology  Procedures:  7/12: Intubation  Significant Diagnostic Tests:  7/12: Chest Xray> no acute cardiopulmonary process 7/12: Noncontrast CT head> no acute intracranial abnormality 7/12: CT Chest abdomen and pelvis> no acute cardiopulmonary process  Micro Data:  7/12: Blood culture x2> 7/12: Urine Culture> 7/12: MRSA PCR>>  7/12: Strep pneumo urinary antigen> 7/12: Legionella urinary antigen>  Antimicrobials:  None  OBJECTIVE  Blood pressure (!) 121/42, pulse (!) 50, temperature (!) 95.4 F (35.2 C), resp. rate 16, height 4\' 11"  (1.499 m), weight 57.8 kg, SpO2 100 %.    Vent Mode: PRVC FiO2 (%):  [40 %-100 %] 40 % Set Rate:  [16 bmp] 16 bmp Vt Set:  [450 mL] 450 mL PEEP:  [5 cmH20] 5 cmH20   Intake/Output Summary (Last 24 hours) at 01/04/2022 1047 Last data filed at 01/03/2022 2238 Gross per 24 hour  Intake --  Output 225 ml  Net -225 ml   Filed Weights   01/03/22 2107 01/04/22 0300  Weight: 59 kg 57.8 kg    Physical Examination  GENERAL:81  year-old critically ill patient lying in the bed intubated, mechanically ventilated and sedated EYES: Pupils equal, round, reactive to light and accommodation. No scleral icterus. Extraocular muscles intact.  HEENT: Head atraumatic, normocephalic. Oropharynx and nasopharynx clear.  NECK:  Supple, no jugular venous distention. No thyroid enlargement, no tenderness.  LUNGS: Decreased l breath sounds bilaterally, no wheezing, rales,rhonchi or crepitation. No use of accessory  muscles of respiration.  CARDIOVASCULAR: S1, S2 normal. No murmurs, rubs, or gallops.  ABDOMEN: Soft, nontender, nondistended. Bowel sounds present. No organomegaly or mass.  EXTREMITIES: No pedal edema, cyanosis, or clubbing.  NEUROLOGIC: Cranial nerves II through XII are intact.  Unable to assess muscle strength. Sensation intact. Gait not checked.  PSYCHIATRIC: The patient is intubated and  sedated SKIN: No obvious rash, lesion, or ulcer.   Labs/imaging that I havepersonally reviewed  (right click and "Reselect all SmartList Selections" daily)     Labs   CBC: Recent Labs  Lab 01/03/22 1826 01/04/22 0514  WBC 8.1 10.3  HGB 8.9* 9.2*  HCT 28.0* 27.9*  MCV 97.2 95.2  PLT 239 193     Basic Metabolic Panel: Recent Labs  Lab 01/03/22 1826 01/03/22 2319 01/04/22 0514  NA 138 140 136  K 3.5 3.1* 2.9*  CL 104 101 101  CO2 24 26 25   GLUCOSE 153* 179* 145*  BUN 32* 35* 36*  CREATININE 2.29* 2.06* 2.05*  CALCIUM 8.2* 8.5* 8.3*  MG 2.1  --  2.2  PHOS  --   --  4.9*    GFR: Estimated Creatinine Clearance: 16.9 mL/min (A) (by C-G formula based on SCr of 2.05 mg/dL (H)). Recent Labs  Lab 01/03/22 1826 01/03/22 1945 01/03/22 2319 01/04/22 0125 01/04/22 0514  PROCALCITON  --  <0.10 <0.10  --  <0.10  WBC 8.1  --   --   --  10.3  LATICACIDVEN  --  2.2* 2.6* 2.1*  --      Liver Function Tests: Recent Labs  Lab 01/03/22 2319  AST 245*  ALT 146*  ALKPHOS 111  BILITOT 1.2  PROT 5.4*  ALBUMIN 3.4*   Recent Labs  Lab 01/03/22 2319  LIPASE 39   No results for input(s): "AMMONIA" in the last 168 hours.  ABG    Component Value Date/Time   PHART 7.32 (L) 06/01/2021 0557   PCO2ART 41 06/01/2021 0557   PO2ART 103 06/01/2021 0557   HCO3 25.4 01/03/2022 1842   ACIDBASEDEF 0.8 01/03/2022 1842   O2SAT 59.8 01/03/2022 1842     Coagulation Profile: Recent Labs  Lab 01/03/22 1945  INR 1.1     Cardiac Enzymes: No results for input(s): "CKTOTAL", "CKMB", "CKMBINDEX", "TROPONINI" in the last 168 hours.  HbA1C: Hgb A1c MFr Bld  Date/Time Value Ref Range Status  12/06/2021 11:42 AM 6.8 (H) 4.8 - 5.6 % Final    Comment:    (NOTE) Pre diabetes:          5.7%-6.4%  Diabetes:              >6.4%  Glycemic control for   <7.0% adults with diabetes   06/01/2021 09:30 AM 6.7 (H) 4.8 - 5.6 % Final    Comment:    (NOTE)         Prediabetes: 5.7 -  6.4         Diabetes: >6.4         Glycemic control for adults with diabetes: <7.0     CBG: Recent Labs  Lab 01/03/22 2334 01/04/22 0250 01/04/22 0808  GLUCAP 159* 151* 120*    Review of Systems:   UNABLE TO OBTAIN PATIENT IS INTUBATED AND SEDATED  Past Medical History  She,  has a past medical history of Chest pain (12/06/2021), Diabetes mellitus without complication (HCC), Hyperlipidemia, Hypertension, and Myocardial infarction (HCC).   Surgical History    Past Surgical History:  Procedure Laterality Date  CARDIAC SURGERY     bypass   CORONARY/GRAFT ACUTE MI REVASCULARIZATION N/A 06/01/2021   Procedure: Coronary/Graft Acute MI Revascularization;  Surgeon: Yolonda Kida, MD;  Location: [place] [facility];  Service: Cardiovascular;  Laterality: N/A;   EYE SURGERY     cataract extraction   LEFT HEART CATH AND CORONARY ANGIOGRAPHY N/A 06/01/2021   Procedure: LEFT HEART CATH AND CORONARY ANGIOGRAPHY;  Surgeon: Yolonda Kida, MD;  Location: [place] [facility];  Service: Cardiovascular;  Laterality: N/A;     Social History   reports that she has quit smoking. Her smoking use included cigarettes. She has never used smokeless tobacco. She reports that she does not currently use alcohol. She reports that she does not currently use drugs.   Family History   Her family history includes Congestive Heart Failure in her mother; Diabetes in her father. There is no history of Breast cancer.   Allergies Allergies  Allergen Reactions   Tetanus Toxoids Swelling    Tetanus and diphtheria toxids   Penicillin G Rash     Home Medications  Prior to Admission medications   Medication Sig Start Date End Date Taking? Authorizing Provider  acetaminophen (TYLENOL) 325 MG tablet Take 650 mg by mouth at bedtime.    [provider]  albuterol (VENTOLIN HFA) 108 (90 Base) MCG/ACT inhaler Inhale 1-2 puffs into the lungs every 4 (four) hours as needed for shortness of breath.  08/09/21   [provider]  aspirin 81 MG EC tablet Take by mouth.    [provider]  atorvastatin (LIPITOR) 80 MG tablet Take 80 mg by mouth every evening. 07/06/20   [provider]  baclofen (LIORESAL) 10 MG tablet Take 10 mg by mouth 3 (three) times daily. 10/01/20   [provider]  Cholecalciferol 25 MCG (1000 UT) capsule Take 1,000 Units by mouth 2 (two) times daily.    [provider]  clopidogrel (PLAVIX) 75 MG tablet Take 1 tablet (75 mg total) by mouth daily. Patient not taking: Reported on 10/03/2021 06/04/21   Loletha Grayer, MD  insulin glargine (LANTUS) 100 UNIT/ML injection Inject 8 Units into the skin at bedtime. 07/24/21   [provider]  insulin lispro (HUMALOG) 100 UNIT/ML KwikPen Inject 0-2 Units into the skin 3 (three) times daily. 07/24/21   [provider]  ipratropium-albuterol (DUONEB) 0.5-2.5 (3) MG/3ML SOLN Take 3 mLs by nebulization every 6 (six) hours as needed (shortness of breath). 08/29/21   [provider]  isosorbide mononitrate (IMDUR) 30 MG 24 hr tablet Take 1 tablet (30 mg total) by mouth daily. 12/09/21   Nicole Kindred A, DO  lisinopril (ZESTRIL) 20 MG tablet Take 1 tablet (20 mg total) by mouth daily. 12/09/21   Nicole Kindred A, DO  metoprolol succinate (TOPROL-XL) 50 MG 24 hr tablet Take 50 mg by mouth daily. 05/13/21   [provider]  Multiple Vitamin (MULTIVITAMIN WITH MINERALS) TABS tablet Take 1 tablet by mouth daily.    [provider]  nitroGLYCERIN (NITROSTAT) 0.4 MG SL tablet Place 0.4 mg under the tongue every 5 (five) minutes as needed for chest pain. 09/01/20   [provider]  nystatin (MYCOSTATIN/NYSTOP) powder Apply three times a day under skin folds in groin Patient taking differently: Apply 1 application. topically 3 (three) times daily as needed (under skin folds in groin). 06/03/21   Loletha Grayer, MD  ondansetron (ZOFRAN-ODT) 4 MG  disintegrating tablet Take 4 mg by mouth every 8 (eight) hours  as needed. 11/23/21   [provider]  senna (SENOKOT) 8.6 MG tablet Take 2 tablets by mouth at bedtime.    [provider]  SPIRIVA HANDIHALER 18 MCG inhalation capsule Place 1 capsule into inhaler and inhale daily as needed for shortness of breath. 08/09/21   [provider]  spironolactone (ALDACTONE) 25 MG tablet Take 12.5 mg by mouth daily. 09/05/21   [provider]  torsemide (DEMADEX) 20 MG tablet Take 20 mg by mouth daily. 08/31/21   [provider]    Scheduled Meds:  albuterol  2.5 mg Nebulization Q4H   aspirin  81 mg Per Tube Daily   Chlorhexidine Gluconate Cloth  6 each Topical Daily   clopidogrel  75 mg Per Tube Daily   fentaNYL (SUBLIMAZE) injection  50 mcg Intravenous Once   insulin aspart  0-9 Units Subcutaneous Q4H   pantoprazole sodium  40 mg Per Tube Daily   potassium chloride  20 mEq Per Tube Q4H   Continuous Infusions:  sodium chloride Stopped (01/03/22 2339)   sodium chloride Stopped (01/03/22 2339)   amiodarone Stopped (01/04/22 [id number])   heparin 650 Units/hr (01/03/22 2219)   nitroGLYCERIN     norepinephrine (LEVOPHED) Adult infusion Stopped (01/03/22 2339)   potassium chloride 10 mEq (01/04/22 1024)   propofol (DIPRIVAN) infusion 30 mcg/kg/min (01/04/22 0900)   PRN Meds:.albuterol, docusate sodium, nitroGLYCERIN, polyethylene glycol  Active Hospital Problem list     Assessment & Plan:   NSTEMI VFib Cardiac Arrest with ROSC after 1 round of CPR/Defibrillation Acute on chronic HFmrEF (recent Echo on 6/23 LVEF 55-60%) PMHx:CAD s/p CABG x2 in 2018 (patent LIMA to LAD,  occluded SVG to OM1 by [facility] 05/2021), h/o PCI x1 distal RCA (50% ISR by [facility] 05/2021), HLD, HTN -Continuous cardiac monitoring -Vasopressors as needed to maintain MAP goal  -Trend Lactic acid  -Trend HS Troponin until peaked  -Will resume Metoprolol & Imdur as bp permits -Continue Amiodarone  gtt -Start Heparin gtt -DUAP therapy with aspirin and Plavix -Atorvastatin 80mg  PO daily -Cardiology consulted, discussed with STEMI on call MD Dr. Corky Sox who evaluated pt at the bedside with recs as above.  See Interventional cardiologist note for further details.   Acute Hypoxic Respiratory Failure in the setting of above -Wean PEEP and FiO2 for sats greater than 90% -Plateau pressures less than 30 cm H20 -Con't LTVV, -VAP bundle in place -Intermittent chest x-ray & ABG -F/u cultures, trend PCT -wean sedation/analgesia for RASS goal 0  AKI on CKD stage III likely ATN in the setting of above BUN/Cr:32/2.29 -Monitor I&O's / urinary output -Follow BMP -Ensure adequate renal perfusion -Avoid nephrotoxic agents as able -Replace electrolytes as indicated   Acute Metabolic Encephalopathy ~ -Provide supportive care -Promote normal sleep/wake cycle -Avoid sedating meds as able -CT Head negative for acute intracranial abnormality   Diabetes mellitus HgbA1c 6.8 -CBG's AC & hs; Target range of 140 to 180 -SSI -Follow ICU Hypo/Hyperglycemia protocol  Acute on Chronic normocytic anemia Current Hgb 8.9 baseline 10 -Monitor for s/s bleeding while on heparin drip -Monitor H&H -Transfuse for hemoglobin<7   Best practice:  Diet:  NPO Pain/Anxiety/Delirium protocol (if indicated): Yes (RASS goal 0) VAP protocol (if indicated): Yes DVT prophylaxis: Systemic AC GI prophylaxis: PPI Glucose control:  SSI Yes Central venous access:  N/A Arterial line:  N/A Foley:  Yes, and it is still needed Mobility:  bed rest  PT consulted: N/A Last date of multidisciplinary goals of care discussion [7/12] Code Status:  full code Disposition: ICU   = Goals of Care = Code Status Order: FULL  Primary Emergency Contact: Behnke,Douglas Wishes to pursue full aggressive treatment and intervention options, including CPR and intubation, but goals of care will be addressed on going with family if that  should become necessary.  Critical care provider statement:   Total critical care time: 33 minutes   Performed by: Karna Christmas MD   Critical care time was exclusive of separately billable procedures and treating other patients.   Critical care was necessary to treat or prevent imminent or life-threatening deterioration.   Critical care was time spent personally by me on the following activities: development of treatment plan with patient and/or surrogate as well as nursing, discussions with consultants, evaluation of patient's response to treatment, examination of patient, obtaining history from patient or surrogate, ordering and performing treatments and interventions, ordering and review of laboratory studies, ordering and review of radiographic studies, pulse oximetry and re-evaluation of patient's condition.    Vida Rigger, M.D.  Pulmonary & Critical Care Medicine

## 2022-01-04 NOTE — Consult Note (Signed)
ANTICOAGULATION CONSULT NOTE  Pharmacy Consult for IV Heparin Indication: chest pain/ACS  Patient Measurements: Height: 4\' 11"  (149.9 cm) Weight: 57.8 kg (127 lb 6.8 oz) IBW/kg (Calculated) : 43.2 Heparin Dosing Weight: 55.5 kg  Labs: Recent Labs    01/03/22 1826 01/03/22 1945 01/03/22 2319 01/04/22 0514  HGB 8.9*  --   --  9.2*  HCT 28.0*  --   --  27.9*  PLT 239  --   --  193  APTT  --  29  --   --   LABPROT  --  13.9  --   --   INR  --  1.1  --   --   HEPARINUNFRC  --   --   --  0.32  CREATININE 2.29*  --  2.06*  --   TROPONINIHS 17 17  --   --      Estimated Creatinine Clearance: 16.8 mL/min (A) (by C-G formula based on SCr of 2.06 mg/dL (H)).   Medical History: Past Medical History:  Diagnosis Date   Chest pain 12/06/2021   Diabetes mellitus without complication (HCC)    Hyperlipidemia    Hypertension    Myocardial infarction [facility])     Medications:  No anticoagulation prior to admission per my chart review  Assessment: Patient is an 81 y/o F with medical history as above and including myocardial infarction who presented to the ED 7/12 PM with SOB and increased WOB. There is concern for acute coronary syndrome and pharmacy consulted to initiate and manage heparin infusion.  Baseline CBC notable for anemia with Hgb 8.9 which is overall consistent with patient's usual range. Baseline aPTT and PT-INR are within normal limits.  Goal of Therapy:  Heparin level 0.3-0.7 units/ml Monitor platelets by anticoagulation protocol: Yes   7/13 0514 HL 0.32, therapeutic x 1 Plan:  --Continue heparin infusion at 650 units/hr --Recheck HL 8 hr to confirm --Daily CBC per protocol while on IV heparin  8/13, PharmD, [facility] 01/04/2022 6:06 AM

## 2022-01-05 LAB — BASIC METABOLIC PANEL
Anion gap: 8 (ref 5–15)
BUN: 34 mg/dL — ABNORMAL HIGH (ref 8–23)
CO2: 24 mmol/L (ref 22–32)
Calcium: 8.2 mg/dL — ABNORMAL LOW (ref 8.9–10.3)
Chloride: 104 mmol/L (ref 98–111)
Creatinine, Ser: 1.49 mg/dL — ABNORMAL HIGH (ref 0.44–1.00)
GFR, Estimated: 35 mL/min — ABNORMAL LOW (ref >60)
Glucose, Bld: 174 mg/dL — ABNORMAL HIGH (ref 70–99)
Potassium: 4.1 mmol/L (ref 3.5–5.1)
Sodium: 136 mmol/L (ref 135–145)

## 2022-01-05 LAB — CBC
HCT: 28.9 % — ABNORMAL LOW (ref 36.0–46.0)
Hemoglobin: 9.6 g/dL — ABNORMAL LOW (ref 12.0–15.0)
MCH: 31.6 pg (ref 26.0–34.0)
MCHC: 33.2 g/dL (ref 30.0–36.0)
MCV: 95.1 fL (ref 80.0–100.0)
Platelets: 218 10*3/uL (ref 150–400)
RBC: 3.04 MIL/uL — ABNORMAL LOW (ref 3.87–5.11)
RDW: 15.1 % (ref 11.5–15.5)
WBC: 11.1 10*3/uL — ABNORMAL HIGH (ref 4.0–10.5)
nRBC: 0 % (ref 0.0–0.2)

## 2022-01-05 LAB — GLUCOSE, CAPILLARY
Glucose-Capillary: 143 mg/dL — ABNORMAL HIGH (ref 70–99)
Glucose-Capillary: 158 mg/dL — ABNORMAL HIGH (ref 70–99)
Glucose-Capillary: 136 mg/dL — ABNORMAL HIGH (ref 70–99)
Glucose-Capillary: 127 mg/dL — ABNORMAL HIGH (ref 70–99)
Glucose-Capillary: 168 mg/dL — ABNORMAL HIGH (ref 70–99)

## 2022-01-05 LAB — PROCALCITONIN: Procalcitonin: 0.1 ng/mL

## 2022-01-05 LAB — TROPONIN I (HIGH SENSITIVITY): Troponin I (High Sensitivity): 3811 ng/L (ref <18)

## 2022-01-05 LAB — HEPARIN LEVEL (UNFRACTIONATED): Heparin Unfractionated: 0.25 IU/mL — ABNORMAL LOW (ref 0.30–0.70)

## 2022-01-05 MED ORDER — ENSURE ENLIVE PO LIQD
237.0000 mL | Freq: Three times a day (TID) | ORAL | Status: DC
  Administered 2022-01-06 – 2022-01-08 (×6): 237 mL via ORAL

## 2022-01-05 MED ORDER — INSULIN ASPART 100 UNIT/ML IJ SOLN: 0.0000 [IU] | Freq: Every day | INTRAMUSCULAR | Status: DC

## 2022-01-05 MED ORDER — ADULT MULTIVITAMIN W/MINERALS CH
1.0000 | ORAL_TABLET | Freq: Every day | ORAL | Status: DC
Start: 2022-01-06 — End: 2022-01-09
  Administered 2022-01-06 – 2022-01-09 (×4): 1 via ORAL
  Filled 2022-01-05 (×4): qty 1

## 2022-01-05 MED ORDER — HEPARIN BOLUS VIA INFUSION
850.0000 [IU] | Freq: Once | INTRAVENOUS | Status: AC
  Administered 2022-01-05: 850 [IU] via INTRAVENOUS
  Filled 2022-01-05: qty 850

## 2022-01-05 MED ORDER — INSULIN ASPART 100 UNIT/ML IJ SOLN
0.0000 [IU] | Freq: Three times a day (TID) | INTRAMUSCULAR | Status: DC
  Administered 2022-01-05: 1 [IU] via SUBCUTANEOUS
  Administered 2022-01-06: 2 [IU] via SUBCUTANEOUS
  Administered 2022-01-06: 1 [IU] via SUBCUTANEOUS
  Administered 2022-01-07 – 2022-01-09 (×2): 2 [IU] via SUBCUTANEOUS
  Filled 2022-01-05 (×5): qty 1

## 2022-01-05 NOTE — Progress Notes (Signed)
Extubation order written. Cuff leak noted. Patient extubated and placed on 2lpm [place]. °

## 2022-01-05 NOTE — Consult Note (Signed)
PHARMACY CONSULT NOTE - FOLLOW UP  Pharmacy Consult for Electrolyte Monitoring and Replacement   Recent Labs: Potassium (mmol/L)  Date Value  01/05/2022 4.1   Magnesium (mg/dL)  Date Value  44/81/8563 2.2   Calcium (mg/dL)  Date Value  14/97/0263 8.2 (L)   Albumin (g/dL)  Date Value  78/58/8502 3.4 (L)   Phosphorus (mg/dL)  Date Value  77/41/2878 4.9 (H)   Sodium (mmol/L)  Date Value  01/05/2022 136   Assessment: 81yo Female w/ h/o CAD (s/p CABG x2 2018), HTN, HLD, DM, PAD, CHF (EF 45-50%>55-60%), CKD-3a, and RLS, who presents to the ED with SOB worse w/ exertion w/ no associated symptoms. In the ED pt went into Vfib arrest w/ ROSC after 1 round of CPR/Defib. Patient reported a fall 3 days ago hitting the side of her head without LOC. Pharmacy consulted for mgmt of electrolytes.  Goal of Therapy:  [place] WNL K>4 & Mg >2  Plan:  Renal recovery Scr 2.29>2.05 >1.49 (BL 1.3-1.5) & improved UOP 49ml/k/h K: 2.9>4>4.1 after KCL PO q4h x2 doses & KCL IV q1h x3 on 7/13.  WNL today, no further repletion at this time Mg 2.2>NNL: CTM, WNL no repletion at this time. Next full set of labs in AM 0500  Martyn Malay ,PharmD Clinical Pharmacist 01/05/2022 8:50 AM

## 2022-01-05 NOTE — Progress Notes (Signed)
Patient with a low diastolic blood pressure. Reyes Ivan NP contacted for clarification regarding BP vs MAP goals. Per O[place] NP maintain MAP 65 or greater. Levophed to be initiated to maintain MAP > 65.

## 2022-01-05 NOTE — Progress Notes (Signed)
NAME:  Lindsey Pollard, MRN:  [id number], DOB:  Mar 13, 1941, LOS: 2 ADMISSION DATE:  01/03/2022, CONSULTATION DATE:/05/2022 REFERRING MD: Hulan Saas, MD, CHIEF COMPLAINT: Shortness of breath/chest pain   HPI  81 y.o female with medical history significant of CAD, s/p of CABG, HTN, HLD, DM, PAD, CHF with EF 45-50%, CKD-3a, and RLS, who presents  to the ED with chief complaints of shortness of breath worse with exertion with no associated symptoms.  Patient reported a fall 3 days ago hitting the side of her head without LOC.  ED Course: Initial vitals int he ED; the temperature was 36.9C, the heart rate 57 beats/minute, the blood pressure 126/33  mm Hg, the respiratory rate 26 breaths/minute, and the oxygen saturation 100% on RA. Patient's noted with increased work of breathing.  She was placed on 2 L via [place] with improvement in her sats.  At around 9 pm, patient started complaining of right-sided chest pain rating 10/10 without other associated symptoms.  She received 1 sublingual nitro with no improvement in her pain.  Shortly after patient went into V-fib arrest without a pulse.  CPR/ACLS initiated, patient received Epi x1 dose and immediately began moving her arms and groaning  with monitor showing V-tach at rate  of 155 bmp with a pulse.  Patient was cardioverted at 200 J back to NSR and 300 mg of amiodarone bolus administered followed by infusion.  Patient was intubated for airway protection.  Post cardiac arrest EKG was obtained and showed persistent diffuse ST depression.  STEMI on-call MD was notified who evaluated patient at the bedside.  PCCM consulted for admission to the ICU.   -01/04/22- patient had episodes of severe aggitation and ventilator dyssynchrony, she was sedated to RASS-1 with adeqate response and improved pulmonary parameters on MV.  01/05/22- Patient successfully extubated to nasal canula  Past Medical History  CAD, s/p of CABG, HTN, HLD, DM, PAD, CHF with EF 45-50%, CKD-3a,  and RLS  Significant Hospital Events   7/12: Mated to ICU with NSTEMI status post V-fib cardiac arrest  Consults:  Cardiology  Procedures:  7/12: Intubation  Significant Diagnostic Tests:  7/12: Chest Xray> no acute cardiopulmonary process 7/12: Noncontrast CT head> no acute intracranial abnormality 7/12: CT Chest abdomen and pelvis> no acute cardiopulmonary process  Micro Data:  7/12: Blood culture x2> 7/12: Urine Culture> 7/12: MRSA PCR>>  7/12: Strep pneumo urinary antigen> 7/12: Legionella urinary antigen>  Antimicrobials:  None  OBJECTIVE  Blood pressure (!) 138/41, pulse (!) 59, temperature 100.2 F (37.9 C), resp. rate 16, height 4\' 11"  (1.499 m), weight 56.7 kg, SpO2 100 %.    Vent Mode: PRVC FiO2 (%):  [30 %-40 %] 30 % Set Rate:  [16 bmp] 16 bmp Vt Set:  [450 mL] 450 mL PEEP:  [5 cmH20] 5 cmH20 Plateau Pressure:  [16 cmH20] 16 cmH20   Intake/Output Summary (Last 24 hours) at 01/05/2022 0830 Last data filed at 01/05/2022 0742 Gross per 24 hour  Intake 1505.28 ml  Output 1345 ml  Net 160.28 ml    Filed Weights   01/03/22 2107 01/04/22 0300 01/05/22 0350  Weight: 59 kg 57.8 kg 56.7 kg    Physical Examination  GENERAL:81  year-old critically ill patient lying in the bed intubated, mechanically ventilated and sedated EYES: Pupils equal, round, reactive to light and accommodation. No scleral icterus. Extraocular muscles intact.  HEENT: Head atraumatic, normocephalic. Oropharynx and nasopharynx clear.  NECK:  Supple, no jugular venous distention. No thyroid  enlargement, no tenderness.  LUNGS: Decreased l breath sounds bilaterally, no wheezing, rales,rhonchi or crepitation. No use of accessory muscles of respiration.  CARDIOVASCULAR: S1, S2 normal. No murmurs, rubs, or gallops.  ABDOMEN: Soft, nontender, nondistended. Bowel sounds present. No organomegaly or mass.  EXTREMITIES: No pedal edema, cyanosis, or clubbing.  NEUROLOGIC: Cranial nerves II through XII  are intact.  Unable to assess muscle strength. Sensation intact. Gait not checked.  PSYCHIATRIC: The patient is intubated and sedated SKIN: No obvious rash, lesion, or ulcer.   Labs/imaging that I havepersonally reviewed  (right click and "Reselect all SmartList Selections" daily)     Labs   CBC: Recent Labs  Lab 01/03/22 1826 01/04/22 0514 01/05/22 0644  WBC 8.1 10.3 11.1*  HGB 8.9* 9.2* 9.6*  HCT 28.0* 27.9* 28.9*  MCV 97.2 95.2 95.1  PLT 239 193 218     Basic Metabolic Panel: Recent Labs  Lab 01/03/22 1826 01/03/22 2319 01/04/22 0514 01/04/22 1421 01/05/22 0644  NA 138 140 136  --  136  K 3.5 3.1* 2.9* 4.0 4.1  CL 104 101 101  --  104  CO2 24 26 25   --  24  GLUCOSE 153* 179* 145*  --  174*  BUN 32* 35* 36*  --  34*  CREATININE 2.29* 2.06* 2.05*  --  1.49*  CALCIUM 8.2* 8.5* 8.3*  --  8.2*  MG 2.1  --  2.2  --   --   PHOS  --   --  4.9*  --   --     GFR: Estimated Creatinine Clearance: 23.1 mL/min (A) (by C-G formula based on SCr of 1.49 mg/dL (H)). Recent Labs  Lab 01/03/22 1826 01/03/22 1945 01/03/22 2319 01/04/22 0125 01/04/22 0514 01/05/22 0644  PROCALCITON  --  <0.10 <0.10  --  <0.10 0.10  WBC 8.1  --   --   --  10.3 11.1*  LATICACIDVEN  --  2.2* 2.6* 2.1*  --   --      Liver Function Tests: Recent Labs  Lab 01/03/22 2319  AST 245*  ALT 146*  ALKPHOS 111  BILITOT 1.2  PROT 5.4*  ALBUMIN 3.4*    Recent Labs  Lab 01/03/22 2319  LIPASE 39    No results for input(s): "AMMONIA" in the last 168 hours.  ABG    Component Value Date/Time   PHART 7.32 (L) 06/01/2021 0557   PCO2ART 41 06/01/2021 0557   PO2ART 103 06/01/2021 0557   HCO3 25.4 01/03/2022 1842   ACIDBASEDEF 0.8 01/03/2022 1842   O2SAT 59.8 01/03/2022 1842     Coagulation Profile: Recent Labs  Lab 01/03/22 1945  INR 1.1     Cardiac Enzymes: No results for input(s): "CKTOTAL", "CKMB", "CKMBINDEX", "TROPONINI" in the last 168 hours.  HbA1C: Hgb A1c MFr Bld   Date/Time Value Ref Range Status  12/06/2021 11:42 AM 6.8 (H) 4.8 - 5.6 % Final    Comment:    (NOTE) Pre diabetes:          5.7%-6.4%  Diabetes:              >6.4%  Glycemic control for   <7.0% adults with diabetes   06/01/2021 09:30 AM 6.7 (H) 4.8 - 5.6 % Final    Comment:    (NOTE)         Prediabetes: 5.7 - 6.4         Diabetes: >6.4         Glycemic control for  adults with diabetes: <7.0     CBG: Recent Labs  Lab 01/04/22 1620 01/04/22 1932 01/04/22 2318 01/05/22 0334 01/05/22 0743  GLUCAP 104* 129* 115* 143* 158*     Review of Systems:   UNABLE TO OBTAIN PATIENT IS INTUBATED AND SEDATED  Past Medical History  She,  has a past medical history of Chest pain (12/06/2021), Diabetes mellitus without complication ([place]), Hyperlipidemia, Hypertension, and Myocardial infarction ([place]).   Surgical History    Past Surgical History:  Procedure Laterality Date   CARDIAC SURGERY     bypass   CORONARY/GRAFT ACUTE MI REVASCULARIZATION N/A 06/01/2021   Procedure: Coronary/Graft Acute MI Revascularization;  Surgeon: Yolonda Kida, MD;  Location: [facility];  Service: Cardiovascular;  Laterality: N/A;   EYE SURGERY     cataract extraction   LEFT HEART CATH AND CORONARY ANGIOGRAPHY N/A 06/01/2021   Procedure: LEFT HEART CATH AND CORONARY ANGIOGRAPHY;  Surgeon: Yolonda Kida, MD;  Location: [facility];  Service: Cardiovascular;  Laterality: N/A;     Social History   reports that she has quit smoking. Her smoking use included cigarettes. She has never used smokeless tobacco. She reports that she does not currently use alcohol. She reports that she does not currently use drugs.   Family History   Her family history includes Congestive Heart Failure in her mother; Diabetes in her father. There is no history of Breast cancer.   Allergies Allergies  Allergen Reactions   Tetanus Toxoids Swelling    Tetanus and diphtheria toxids   Penicillin G Rash      Home Medications  Prior to Admission medications   Medication Sig Start Date End Date Taking? Authorizing Provider  acetaminophen (TYLENOL) 325 MG tablet Take 650 mg by mouth at bedtime.    [provider]  albuterol (VENTOLIN HFA) 108 (90 Base) MCG/ACT inhaler Inhale 1-2 puffs into the lungs every 4 (four) hours as needed for shortness of breath. 08/09/21   [provider]  aspirin 81 MG EC tablet Take by mouth.    [provider]  atorvastatin (LIPITOR) 80 MG tablet Take 80 mg by mouth every evening. 07/06/20   [provider]  baclofen (LIORESAL) 10 MG tablet Take 10 mg by mouth 3 (three) times daily. 10/01/20   [provider]  Cholecalciferol 25 MCG (1000 UT) capsule Take 1,000 Units by mouth 2 (two) times daily.    [provider]  clopidogrel (PLAVIX) 75 MG tablet Take 1 tablet (75 mg total) by mouth daily. Patient not taking: Reported on 10/03/2021 06/04/21   Loletha Grayer, MD  insulin glargine (LANTUS) 100 UNIT/ML injection Inject 8 Units into the skin at bedtime. 07/24/21   [provider]  insulin lispro (HUMALOG) 100 UNIT/ML KwikPen Inject 0-2 Units into the skin 3 (three) times daily. 07/24/21   [provider]  ipratropium-albuterol (DUONEB) 0.5-2.5 (3) MG/3ML SOLN Take 3 mLs by nebulization every 6 (six) hours as needed (shortness of breath). 08/29/21   [provider]  isosorbide mononitrate (IMDUR) 30 MG 24 hr tablet Take 1 tablet (30 mg total) by mouth daily. 12/09/21   Nicole Kindred A, DO  lisinopril (ZESTRIL) 20 MG tablet Take 1 tablet (20 mg total) by mouth daily. 12/09/21   Nicole Kindred A, DO  metoprolol succinate (TOPROL-XL) 50 MG 24 hr tablet Take 50 mg by mouth daily. 05/13/21   [provider]  Multiple Vitamin (MULTIVITAMIN WITH MINERALS) TABS tablet Take 1 tablet by mouth  daily.    [provider]  nitroGLYCERIN (NITROSTAT) 0.4 MG SL tablet Place 0.4 mg under the tongue  every 5 (five) minutes as needed for chest pain. 09/01/20   [provider]  nystatin (MYCOSTATIN/NYSTOP) powder Apply three times a day under skin folds in groin Patient taking differently: Apply 1 application. topically 3 (three) times daily as needed (under skin folds in groin). 06/03/21   Alford Highland, MD  ondansetron (ZOFRAN-ODT) 4 MG disintegrating tablet Take 4 mg by mouth every 8 (eight) hours as needed. 11/23/21   [provider]  senna (SENOKOT) 8.6 MG tablet Take 2 tablets by mouth at bedtime.    [provider]  SPIRIVA HANDIHALER 18 MCG inhalation capsule Place 1 capsule into inhaler and inhale daily as needed for shortness of breath. 08/09/21   [provider]  spironolactone (ALDACTONE) 25 MG tablet Take 12.5 mg by mouth daily. 09/05/21   [provider]  torsemide (DEMADEX) 20 MG tablet Take 20 mg by mouth daily. 08/31/21   [provider]    Scheduled Meds:  albuterol  2.5 mg Nebulization TID   aspirin  81 mg Per Tube Daily   Chlorhexidine Gluconate Cloth  6 each Topical Daily   clopidogrel  75 mg Per Tube Daily   feeding supplement (PROSource TF)  45 mL Per Tube BID   feeding supplement (VITAL HIGH PROTEIN)  1,000 mL Per Tube Q24H   fentaNYL (SUBLIMAZE) injection  50 mcg Intravenous Once   free water  30 mL Per Tube Q4H   insulin aspart  0-9 Units Subcutaneous Q4H   multivitamin  15 mL Per Tube Daily   mouth rinse  15 mL Mouth Rinse Q2H   pantoprazole sodium  40 mg Per Tube Daily   Continuous Infusions:  sodium chloride Stopped (01/03/22 2339)   sodium chloride Stopped (01/03/22 2339)   amiodarone Stopped (01/04/22 0507)   heparin 650 Units/hr (01/05/22 0742)   nitroGLYCERIN     norepinephrine (LEVOPHED) Adult infusion 7 mcg/min (01/05/22 0742)   propofol (DIPRIVAN) infusion 25 mcg/kg/min (01/05/22 0742)   PRN Meds:.albuterol, docusate sodium, midazolam, nitroGLYCERIN, mouth rinse, polyethylene glycol  Active  Hospital Problem list     Assessment & Plan:   NSTEMI VFib Cardiac Arrest with ROSC after 1 round of CPR/Defibrillation Acute on chronic HFmrEF (recent Echo on 6/23 LVEF 55-60%) PMHx:CAD s/p CABG x2 in 2018 (patent LIMA to LAD,  occluded SVG to OM1 by [facility] 05/2021), h/o PCI x1 distal RCA (50% ISR by [facility] 05/2021), HLD, HTN -Continuous cardiac monitoring -Vasopressors as needed to maintain MAP goal  -Trend Lactic acid  -Trend HS Troponin until peaked  -Will resume Metoprolol & Imdur as bp permits -Continue Amiodarone gtt -Start Heparin gtt -DUAP therapy with aspirin and Plavix -Atorvastatin 80mg  PO daily -Cardiology consulted, discussed with STEMI on call MD Dr. who evaluated pt at the bedside with recs as above.  See Interventional cardiologist note for further details.   Acute Hypoxic Respiratory Failure in the setting of above -Wean PEEP and FiO2 for sats greater than 90% -Plateau pressures less than 30 cm H20 -Con't LTVV, -VAP bundle in place -Intermittent chest x-ray & ABG -F/u cultures, trend PCT -wean sedation/analgesia for RASS goal 0  AKI on CKD stage III likely ATN in the setting of above BUN/Cr:32/2.29 -Monitor I&O's / urinary output -Follow BMP -Ensure adequate renal perfusion -Avoid nephrotoxic agents as able -Replace electrolytes as indicated   Acute Metabolic Encephalopathy ~ -Provide supportive care -Promote  normal sleep/wake cycle -Avoid sedating meds as able -CT Head negative for acute intracranial abnormality   Diabetes mellitus HgbA1c 6.8 -CBG's AC & hs; Target range of 140 to 180 -SSI -Follow ICU Hypo/Hyperglycemia protocol  Acute on Chronic normocytic anemia Current Hgb 8.9 baseline 10 -Monitor for s/s bleeding while on heparin drip -Monitor H&H -Transfuse for hemoglobin<7   Best practice:  Diet:  NPO Pain/Anxiety/Delirium protocol (if indicated): Yes (RASS goal 0) VAP protocol (if indicated): Yes DVT prophylaxis: Systemic AC GI  prophylaxis: PPI Glucose control:  SSI Yes Central venous access:  N/A Arterial line:  N/A Foley:  Yes, and it is still needed Mobility:  bed rest  PT consulted: N/A Last date of multidisciplinary goals of care discussion [7/12] Code Status:  full code Disposition: ICU   = Goals of Care = Code Status Order: FULL  Primary Emergency Contact: Behnke,Douglas Wishes to pursue full aggressive treatment and intervention options, including CPR and intubation, but goals of care will be addressed on going with family if that should become necessary.  Critical care provider statement:   Total critical care time: 33 minutes   Performed by: Lanney Gins MD   Critical care time was exclusive of separately billable procedures and treating other patients.   Critical care was necessary to treat or prevent imminent or life-threatening deterioration.   Critical care was time spent personally by me on the following activities: development of treatment plan with patient and/or surrogate as well as nursing, discussions with consultants, evaluation of patient's response to treatment, examination of patient, obtaining history from patient or surrogate, ordering and performing treatments and interventions, ordering and review of laboratory studies, ordering and review of radiographic studies, pulse oximetry and re-evaluation of patient's condition.    Ottie Glazier, M.D.  Pulmonary & Critical Care Medicine

## 2022-01-05 NOTE — Consult Note (Signed)
ANTICOAGULATION CONSULT NOTE  Pharmacy Consult for IV Heparin Indication: chest pain/ACS  Patient Measurements: Height: 4\' 11"  (149.9 cm) Weight: 56.7 kg (125 lb) IBW/kg (Calculated) : 43.2 Heparin Dosing Weight: 55.5 kg  Labs: Recent Labs    01/03/22 1826 01/03/22 1945 01/03/22 2319 01/04/22 0514 01/04/22 0820 01/04/22 1421 01/04/22 1657 01/04/22 1916 01/05/22 0644  HGB 8.9*  --   --  9.2*  --   --   --   --  9.6*  HCT 28.0*  --   --  27.9*  --   --   --   --  28.9*  PLT 239  --   --  193  --   --   --   --  218  APTT  --  29  --   --   --   --   --   --   --   LABPROT  --  13.9  --   --   --   --   --   --   --   INR  --  1.1  --   --   --   --   --   --   --   HEPARINUNFRC  --   --   --  0.32  --  0.34  --   --  0.25*  CREATININE 2.29*  --  2.06* 2.05*  --   --   --   --   --   TROPONINIHS 17 17  --   --    < > 6,377* 6,153* 6,002*  --    < > = values in this interval not displayed.   Heparin Dosing Weight: 55.5 kg  Estimated Creatinine Clearance: 16.8 mL/min (A) (by C-G formula based on SCr of 2.05 mg/dL (H)).  Medical History: Past Medical History:  Diagnosis Date   Chest pain 12/06/2021   Diabetes mellitus without complication (HCC)    Hyperlipidemia    Hypertension    Myocardial infarction [facility])    Medications:  No anticoagulation prior to admission per my chart review  Assessment: Patient is an 81 y/o F w/ h/o CAD (s/p CABG x2 2018), HTN, HLD, DM, PAD, CHF (EF 45-50%>55-60%), CKD-3a, and RLS, who presents to the ED with SOB worse w/ exertion w/ no associated symptoms. In the ED pt went into Vfib arrest w/ ROSC after 1 round of CPR/Defib. There is concern for acute coronary syndrome and pharmacy consulted to initiate and manage heparin infusion.  Baseline CBC notable for anemia with Hgb 8.9 which is overall consistent with patient's usual range. Baseline aPTT and PT-INR are within normal limits.  Goal of Therapy:  Heparin level 0.3-0.7 units/ml Monitor  platelets by anticoagulation protocol: Yes   7/13 0514 HL 0.32, therapeutic x1 7/13 1421 HL 0.34; therapeutic x2 7/14 0644 HL 0.25; subtherapeutic 650 > 800 un/hr  Plan:  Subtherapeutic HL @0 .25. H/H stable/low & Plts stable/WNL Bolus 850 units of heparin x1; then increase heparin infusion rate to 800 units/hr Recheck HL in 8hrs after rate change Daily CBC per protocol while on IV heparin  8/14, PharmD, [facility] Clinical Pharmacist 01/05/2022 7:30 AM

## 2022-01-05 NOTE — Progress Notes (Signed)
Nutrition Follow Up Note   DOCUMENTATION CODES:   Not applicable  INTERVENTION:   Ensure Enlive po TID, each supplement provides 350 kcal and 20 grams of protein.  MVI po daily   NUTRITION DIAGNOSIS:   Moderate Malnutrition related to chronic illness as evidenced by percent weight loss, mild fat depletion, moderate fat depletion, moderate muscle depletion, severe muscle depletion.  GOAL:   Patient will meet greater than or equal to 90% of their needs  MONITOR:   PO intake, Supplement acceptance, Labs, Weight trends, Skin, I & O's  ASSESSMENT:   81 y/o female with h/o CAD s/p CABG x 2, PVD, GERD, CKD III, MI, DM, CHF, HTN, HLD and recent fall who is admitted s/p VFib cardiac arrest.  Pt extubated today. Pt initiated on a heart healthy diet. RD will add supplements and MVI to help pt meet her estimated needs. Per chart, pt is down 5lbs since admission; pt - on her I & Os.   Medications reviewed and include: aspirin, plavix, insulin, protonix, MVI  Labs reviewed: K 4.1 wnl, BUN 34(H), creat 1.49(H) P 4.9(H), Mg 2.2 wnl- 7/13 Wbc- 11.1(H), Hgb 9.6(L), Hct 28.9(L) Cbgs- 127, 136, 158, 143 x 24 hrs  Diet Order:   Diet Order             Diet Heart Room service appropriate? Yes; Fluid consistency: Thin  Diet effective now                  EDUCATION NEEDS:   No education needs have been identified at this time  Skin:  Skin Assessment: Reviewed RN Assessment (ecchymosis)  Last BM:  pta  Height:   Ht Readings from Last 1 Encounters:  01/04/22 4\' 11"  (1.499 m)    Weight:   Wt Readings from Last 1 Encounters:  01/05/22 56.7 kg    Ideal Body Weight:  44.5 kg  BMI:  Body mass index is 25.25 kg/m.  Estimated Nutritional Needs:   Kcal:  1400-1600kcal/day  Protein:  70-80g/day  Fluid:  1.2-1.4L/day  01/07/22 MS, RD, LDN Please refer to [facility] for RD and/or RD on-call/weekend/after hours pager

## 2022-01-05 NOTE — TOC Initial Note (Signed)
Transition of Care (TOC) - Initial/Assessment Note    Patient Details  Name: Lindsey Pollard MRN: [id number] Date of Birth: 10/15/1940  Transition of Care (TOC) CM/SW Contact:    Jeanna M Creech, RN Phone Number: 01/05/2022, 5:12 PM  Clinical Narrative:                 Patient admitted to the hospital s/p cardiac arrest.  Patient was extubated this morning.  RNCM met with patient shortly after extubation, she is awake and alert.  Patient's daughter is here at the bedside, she and her husband drove from [place] and just arrived today.   Patient lives in [place] with her friend Donna.  Current with PCP.   Nephew is primary contact person he lives in [place] and works as a nurse, he will be coming next week.  TOC will cont to follow.  Patient currently tolerating [place] at 2L.    Expected Discharge Plan: Skilled Nursing Facility Barriers to Discharge: Continued Medical Work up   Patient Goals and CMS Choice Patient states their goals for this hospitalization and ongoing recovery are:: Patient just extubated      Expected Discharge Plan and Services Expected Discharge Plan: Skilled Nursing Facility       Living arrangements for the past 2 months: Single Family Home                                      Prior Living Arrangements/Services Living arrangements for the past 2 months: Single Family Home Lives with:: Friends Patient language and need for interpreter reviewed:: Yes Do you feel safe going back to the place where you live?: Yes      Need for Family Participation in Patient Care: Yes (Comment) Care giver support system in place?: Yes (comment)   Criminal Activity/Legal Involvement Pertinent to Current Situation/Hospitalization: No - Comment as needed  Activities of Daily Living      Permission Sought/Granted Permission sought to share information with : Case Manager, Family Supports Permission granted to share information with : Yes, Verbal Permission  Granted  Share Information with NAME: Douglas Behnke     Permission granted to share info w Relationship: nephew  Permission granted to share info w Contact Information: [phone]  Emotional Assessment Appearance:: Appears stated age Attitude/Demeanor/Rapport: Engaged Affect (typically observed): Accepting Orientation: : Oriented to Self, Oriented to Place, Oriented to  Time, Oriented to Situation Alcohol / Substance Use: Not Applicable Psych Involvement: No (comment)  Admission diagnosis:  Cardiac arrest (HCC) [I46.9] SOB (shortness of breath) [R06.02] AKI (acute kidney injury) (HCC) [N17.9] Fall, initial encounter [W19.XXXA] Cardiac arrest with successful resuscitation (HCC) [I46.9] Patient Active Problem List   Diagnosis Date Noted   Cardiac arrest with successful resuscitation (HCC) 01/03/2022   Unstable angina (HCC) 12/07/2021   NSTEMI (non-ST elevated myocardial infarction) (HCC) 12/06/2021   Chronic kidney disease, stage 3a (HCC) 12/06/2021   Lung nodule 12/06/2021   Type II diabetes mellitus with renal manifestations (HCC) 12/06/2021   Chronic combined systolic and diastolic CHF (congestive heart failure) (HCC) 12/06/2021   HTN (hypertension) 12/06/2021   HLD (hyperlipidemia) 12/06/2021   AKI (acute kidney injury) (HCC)    Anemia    Acute respiratory failure with hypoxia (HCC)    Chronic diastolic CHF (congestive heart failure) (HCC)    Fungal infection of the groin    Fungal infection of skin    Non-STEMI (non-ST elevated myocardial   infarction) ([place]) 06/01/2021   Essential hypertension 06/01/2021   Carotid stenosis 10/26/2020   Atherosclerosis of native arteries of extremity with intermittent claudication ([place]) 09/16/2020   Carotid bruit present 09/16/2020   Healthcare maintenance 01/06/2019   Aortic calcification ([place]) 06/19/2017   Ischemic cardiomyopathy 06/19/2017   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/17/2017   PVD (peripheral vascular disease) ([place]) 06/14/2017   Morbid  obesity due to excess calories ([place]) 06/07/2015   Pseudophakia of right eye 11/10/2014   Bilateral hearing loss 10/05/2014   CAD (coronary artery disease) 10/05/2014   GERD (gastroesophageal reflux disease) 10/05/2014   RLS (restless legs syndrome) 09/09/2014   Mixed hyperlipidemia 08/27/2014   Type 2 diabetes mellitus with hyperlipidemia ([place]) 08/27/2014   PCP:  [name], MD Pharmacy:   [facility] [address], [place] - [place] [address] [place] [address] Phone: [phone] Fax: [phone]  [facility] - [place], [place] - [place] [address] Phone: [phone] Fax: [phone]     Social Determinants of Health (SDOH) Interventions    Readmission Risk Interventions    01/05/2022    5:09 PM  Readmission Risk Prevention Plan  Transportation Screening Complete  PCP or Specialist Appt within 3-5 Days Complete  Social Work Consult for [place] Planning/Counseling Not Complete  SW consult not completed comments NA  Palliative Care Screening Not Applicable  Medication Review Press photographer) Referral to Pharmacy

## 2022-01-05 NOTE — Progress Notes (Addendum)
[place] NOTE       Patient ID: Lindsey Pollard MRN: [id number] DOB/AGE: 01-09-1941 81 y.o.  Admit date: 01/03/2022 Referring Physician Lisabeth Pick, NP  Primary Physician Dr. [name] Primary Cardiologist Dr. 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  Cardiology is consulted because she developed VF arrest while in the ED.    Interval History:  - echo with new moderate hypokinesis of LV mid apical, anterolateral wall - troponin peaked yesterday at 6100 - remains intubated and reportedly following some commands on minimal sedation  - started on levophed this morning to maintain MAP  ROS limited by  patient sedation.   Past Medical History:  Diagnosis Date   Chest pain 12/06/2021   Diabetes mellitus without complication ([place])    Hyperlipidemia    Hypertension    Myocardial infarction [facility])     Past Surgical History:  Procedure Laterality Date   CARDIAC SURGERY     bypass   CORONARY/GRAFT ACUTE MI REVASCULARIZATION N/A 06/01/2021   Procedure: Coronary/Graft Acute MI Revascularization;  Surgeon: Yolonda Kida, MD;  Location: [place] [facility];  Service: Cardiovascular;  Laterality: N/A;   EYE SURGERY     cataract extraction   LEFT HEART CATH AND CORONARY ANGIOGRAPHY N/A 06/01/2021   Procedure: LEFT HEART CATH AND CORONARY ANGIOGRAPHY;  Surgeon: Yolonda Kida, MD;  Location: [place] [facility];  Service: Cardiovascular;  Laterality: N/A;    Medications Prior to Admission  Medication Sig Dispense Refill Last Dose   acetaminophen (TYLENOL) 325 MG tablet Take 650 mg by mouth at bedtime.   Past  Week   albuterol (VENTOLIN HFA) 108 (90 Base) MCG/ACT inhaler Inhale 1-2 puffs into the lungs every 4 (four) hours as needed for shortness of breath.   Past Week   aspirin 81 MG EC tablet Take by mouth.   Past Week   atorvastatin (LIPITOR) 80 MG tablet Take 80 mg by mouth every evening.   Past Week   baclofen (LIORESAL) 10 MG tablet Take 10 mg by mouth 3 (three) times daily.   Past Week   Cholecalciferol 25 MCG (1000 UT) capsule Take 1,000 Units by mouth 2 (two) times daily.   Past Week   clopidogrel (PLAVIX) 75 MG tablet Take 1 tablet (75 mg total) by mouth daily. 30 tablet 0 Past Week   insulin glargine (LANTUS) 100 UNIT/ML injection Inject 8 Units into the skin at bedtime.   Past Week   insulin lispro (HUMALOG) 100 UNIT/ML KwikPen Inject 0-2 Units into the skin 3 (three) times daily.   Past Week   ipratropium-albuterol (DUONEB) 0.5-2.5 (3) MG/3ML SOLN Take 3 mLs by nebulization every 6 (six) hours as needed (shortness of breath).   Past Week   isosorbide mononitrate (IMDUR) 30 MG 24 hr tablet Take 1 tablet (30 mg total) by mouth daily. 30 tablet 1 Past Week   lisinopril (ZESTRIL) 20 MG tablet Take 1 tablet (20 mg total) by mouth daily. 30 tablet 1 Past Week   metoprolol succinate (TOPROL-XL) 50 MG 24 hr tablet Take 50 mg by mouth daily.  Past Week   Multiple Vitamin (MULTIVITAMIN WITH MINERALS) TABS tablet Take 1 tablet by mouth daily.   Past Week   ondansetron (ZOFRAN-ODT) 4 MG disintegrating tablet Take 4 mg by mouth every 8 (eight) hours as needed.   Past Week   senna (SENOKOT) 8.6 MG tablet Take 2 tablets by mouth at bedtime.   Past Week   SPIRIVA HANDIHALER 18 MCG inhalation capsule Place 1 capsule into inhaler and inhale daily as needed for shortness of breath.   Past Week   spironolactone (ALDACTONE) 25 MG tablet Take 12.5 mg by mouth daily.   Past Week   torsemide (DEMADEX) 20 MG tablet Take 20 mg by mouth daily.   Past Week   nitroGLYCERIN (NITROSTAT) 0.4 MG SL tablet Place 0.4 mg  under the tongue every 5 (five) minutes as needed for chest pain.    at prn   nystatin (MYCOSTATIN/NYSTOP) powder Apply three times a day under skin folds in groin (Patient not taking: Reported on 01/04/2022) 60 g 0 Not Taking   Social History   Socioeconomic History   Marital status: Divorced    Spouse name: Not on file   Number of children: Not on file   Years of education: Not on file   Highest education level: Not on file  Occupational History   Not on file  Tobacco Use   Smoking status: Former    Types: Cigarettes   Smokeless tobacco: Never  Substance and Sexual Activity   Alcohol use: Not Currently   Drug use: Not Currently   Sexual activity: Not on file  Other Topics Concern   Not on file  Social History Narrative   Not on file   Social Determinants of Health   Financial Resource Strain: Not on file  Food Insecurity: Not on file  Transportation Needs: Not on file  Physical Activity: Not on file  Stress: Not on file  Social Connections: Not on file  Intimate Partner Violence: Not on file    Family History  Problem Relation Age of Onset   Congestive Heart Failure Mother    Diabetes Father    Breast cancer Neg Hx       PHYSICAL EXAM General: Elderly and critically ill appearing Caucasian female, intubated and minimally sedated in the ICU.   HEENT:  Normocephalic and atraumatic. Neck:  No JVD.  Lungs: Mechanically ventilated coarse breath sounds anteriorly. Heart: Bradycardic but regular, S1-S2 present with 2/6 systolic murmur abdomen: Non-distended appearing.  Msk: Normal strength and tone for age. Extremities: Warm and well perfused. No clubbing, cyanosis.  No peripheral edema.  Safety mitts on bilateral hands Neuro: Intubated and sedated Psych: Intubated and sedated  Labs:   Lab Results  Component Value Date   WBC 11.1 (H) 01/05/2022   HGB 9.6 (L) 01/05/2022   HCT 28.9 (L) 01/05/2022   MCV 95.1 01/05/2022   PLT 218 01/05/2022    Recent Labs  Lab  01/03/22 2319 01/04/22 0514 01/05/22 0644  NA 140   < > 136  K 3.1*   < > 4.1  CL 101   < > 104  CO2 26   < > 24  BUN 35*   < > 34*  CREATININE 2.06*   < > 1.49*  CALCIUM 8.5*   < > 8.2*  PROT 5.4*  --   --   BILITOT 1.2  --   --   ALKPHOS 111  --   --   ALT 146*  --   --  AST 245*  --   --   GLUCOSE 179*   < > 174*   < > = values in this interval not displayed.    No results found for: "CKTOTAL", "CKMB", "CKMBINDEX", "TROPONINI"  Lab Results  Component Value Date   CHOL 102 12/07/2021   CHOL 127 06/01/2021   Lab Results  Component Value Date   HDL 31 (L) 12/07/2021   HDL 47 06/01/2021   Lab Results  Component Value Date   LDLCALC 45 12/07/2021   LDLCALC 49 06/01/2021   Lab Results  Component Value Date   TRIG 101 01/04/2022   TRIG 128 12/07/2021   TRIG 154 (H) 06/01/2021   Lab Results  Component Value Date   CHOLHDL 3.3 12/07/2021   CHOLHDL 2.7 06/01/2021   No results found for: "LDLDIRECT"    Radiology: ECHOCARDIOGRAM LIMITED  Result Date: 01/04/2022    ECHOCARDIOGRAM LIMITED REPORT   Patient Name:   Lindsey Pollard [facility] Date of Exam: 01/04/2022 Medical Rec #:  [id number]     Height:       59.0 in Accession #:    [id number]    Weight:       127.4 lb Date of Birth:  1940-08-05     BSA:          1.523 m Patient Age:    77 years      BP:           128/50 mmHg Patient Gender: F             HR:           54 bpm. Exam Location:  [facility] Procedure: Limited Echo, Color Doppler and Cardiac Doppler Indications:     Cardiac Areest I46.9  History:         Patient has prior history of Echocardiogram examinations, most                  recent 12/07/2021. Previous Myocardial Infarction,                  Signs/Symptoms:Chest Pain; Risk Factors:Diabetes and                  Hypertension.  Sonographer:     Sherrie Sport Referring Phys:  [id number] [place] TANG Diagnosing Phys: Serafina Royals MD  Sonographer Comments: Image acquisition challenging due to patient behavioral factors. IMPRESSIONS   1. The left ventricle demonstrates regional wall motion abnormalities (see scoring diagram/findings for description).  2. Right ventricular systolic function is normal. The right ventricular size is normal.  3. Left atrial size was moderately dilated.  4. The mitral valve is normal in structure. Moderate mitral valve regurgitation.  5. Tricuspid valve regurgitation is mild to moderate.  6. The aortic valve is normal in structure. Aortic valve regurgitation is mild. FINDINGS  Left Ventricle: The left ventricle demonstrates regional wall motion abnormalities. Moderate hypokinesis of the left ventricular, mid-apical anterolateral wall. Right Ventricle: The right ventricular size is normal. No increase in right ventricular wall thickness. Right ventricular systolic function is normal. Left Atrium: Left atrial size was moderately dilated. Right Atrium: Right atrial size was normal in size. Pericardium: There is no evidence of pericardial effusion. Mitral Valve: The mitral valve is normal in structure. Moderate mitral valve regurgitation. Tricuspid Valve: The tricuspid valve is normal in structure. Tricuspid valve regurgitation is mild to moderate. Aortic Valve: The aortic valve is normal in structure. Aortic valve regurgitation is mild. Pulmonic Valve: The pulmonic  valve was normal in structure. Pulmonic valve regurgitation is trivial. Aorta: The aortic root and ascending aorta are structurally normal, with no evidence of dilitation. IAS/Shunts: No atrial level shunt detected by color flow Doppler. LEFT VENTRICLE PLAX 2D LVIDd:         4.40 cm LVIDs:         3.20 cm LV PW:         1.10 cm LV IVS:        1.00 cm LVOT diam:     2.00 cm LVOT Area:     3.14 cm  LEFT ATRIUM         Index LA diam:    3.40 cm 2.23 cm/m   AORTA Ao Root diam: 2.90 cm  SHUNTS Systemic Diam: 2.00 cm Serafina Royals MD Electronically signed by Serafina Royals MD Signature Date/Time: 01/04/2022/4:56:35 PM    Final    DG Chest 1 View  Result Date:  01/04/2022 CLINICAL DATA:  81 year old female intubated.  Post CPR. EXAM: CHEST  1 VIEW COMPARISON:  Portable chest 01/03/2022 and earlier. FINDINGS: Portable AP semi upright view at 0611 hours. Endotracheal tube tip partially obscured by sternotomy hardware, but appears to be in good position between the level the clavicles and carina. Enteric tube courses into the abdomen, tip is not included. Stable pacer or resuscitation pads over the left chest. Stable lung volumes. Mediastinal contours remain normal. Allowing for portable technique the lungs are clear. No pneumothorax or pleural effusion identified. Negative visible bowel gas. Stable visualized osseous structures. IMPRESSION: 1. Satisfactory ET tube position. Enteric tube courses to the abdomen, tip not included. 2. No acute cardiopulmonary abnormality. Electronically Signed   By: Genevie Ann M.D.   On: 01/04/2022 07:26   DG Chest Portable 1 View  Result Date: 01/03/2022 CLINICAL DATA:  Intubation, post CPR EXAM: PORTABLE CHEST 1 VIEW COMPARISON:  01/03/2022 FINDINGS: Endotracheal tube is in the right mainstem bronchus. Recommend retracting approximately 5 cm. NG tube enters the stomach. Prior CABG. Heart is normal size. Increasing airspace disease throughout the left lung which may reflect atelectasis due to right mainstem intubation. Similar right upper lobe opacity/atelectasis. No effusions. IMPRESSION: Right mainstem intubation. Recommend retracting endotracheal tube approximately 5 cm. Areas of airspace disease in the left lung and right upper lobe may be related to atelectasis from right mainstem intubation. These results were called by telephone at the time of interpretation on 01/03/2022 at 10:21 pm to provider [facility] , who verbally acknowledged these results. Electronically Signed   By: Rolm Baptise M.D.   On: 01/03/2022 22:24   CT Cervical Spine Wo Contrast  Result Date: 01/03/2022 CLINICAL DATA:  Neck trauma (Age >= 65y) EXAM: CT CERVICAL  SPINE WITHOUT CONTRAST TECHNIQUE: Multidetector CT imaging of the cervical spine was performed without intravenous contrast. Multiplanar CT image reconstructions were also generated. RADIATION DOSE REDUCTION: This exam was performed according to the departmental dose-optimization program which includes automated exposure control, adjustment of the mA and/or kV according to patient size and/or use of iterative reconstruction technique. COMPARISON:  None Available. FINDINGS: Alignment: Normal Skull base and vertebrae: No acute fracture. No primary bone lesion or focal pathologic process. Soft tissues and spinal canal: No prevertebral fluid or swelling. No visible canal hematoma. 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 Mild bilateral degenerative facet disease. Upper chest: No acute findings Other: None IMPRESSION: Degenerative disc and facet disease.  No acute bony abnormality. Electronically Signed  By: Rolm Baptise M.D.   On: 01/03/2022 20:10   CT HEAD WO CONTRAST (5MM)  Result Date: 01/03/2022 CLINICAL DATA:  Head trauma, minor (Age >= 65y) EXAM: CT HEAD WITHOUT CONTRAST TECHNIQUE: Contiguous axial images were obtained from the base of the skull through the vertex without intravenous contrast. RADIATION DOSE REDUCTION: This exam was performed according to the departmental dose-optimization program which includes automated exposure control, adjustment of the mA and/or kV according to patient size and/or use of iterative reconstruction technique. COMPARISON:  10/03/2021 FINDINGS: Brain: No acute intracranial abnormality. Specifically, no hemorrhage, hydrocephalus, mass lesion, acute infarction, or significant intracranial injury. Vascular: No hyperdense vessel or unexpected calcification. Skull: No acute calvarial abnormality. Sinuses/Orbits: No acute findings Other: None IMPRESSION: Normal study for patient's age. Electronically Signed   By: Rolm Baptise M.D.   On: 01/03/2022 20:08   CT CHEST ABDOMEN PELVIS WO CONTRAST  Result Date: 01/03/2022 CLINICAL DATA:  Pneumonia, complication suspected, xray done. Shortness of breath. EXAM: CT CHEST, ABDOMEN AND PELVIS WITHOUT CONTRAST TECHNIQUE: Multidetector CT imaging of the chest, abdomen and pelvis was performed following the standard protocol without IV contrast. RADIATION DOSE REDUCTION: This exam was performed according to the departmental dose-optimization program which includes automated exposure control, adjustment of the mA and/or kV according to patient size and/or use of iterative reconstruction technique. COMPARISON:  None Available. FINDINGS: CT CHEST FINDINGS Cardiovascular: Heavily calcified aorta and coronary arteries. Prior CABG. Heart is normal size. Aorta is normal caliber. Mediastinum/Nodes: No mediastinal, hilar, or axillary adenopathy. Trachea and esophagus are unremarkable. Thyroid unremarkable. Lungs/Pleura: Lungs are clear. No focal airspace opacities or suspicious nodules. No effusions. Musculoskeletal: Chest wall soft tissues are unremarkable. No acute bony abnormality. CT ABDOMEN PELVIS FINDINGS Hepatobiliary: Small gallstone within the gallbladder. No focal hepatic abnormality. Pancreas: No focal abnormality or ductal dilatation. Spleen: No focal abnormality.  Normal size. Adrenals/Urinary Tract: Left kidney is atrophic. No renal or adrenal mass. No stones or hydronephrosis. Urinary bladder unremarkable. Stomach/Bowel: Left colonic diverticulosis. No active diverticulitis. Stomach and small bowel decompressed, unremarkable. Vascular/Lymphatic: Heavily calcified aorta. No evidence of aneurysm or adenopathy. Reproductive: Calcified fibroids in the uterus.  No adnexal masses. Other: No free fluid or free air. Musculoskeletal: No acute bony abnormality. Scoliosis and degenerative changes in the lumbar spine. IMPRESSION: No acute cardiopulmonary disease. Diffuse aortoiliac atherosclerosis.   No aneurysm. Cholelithiasis. Left colonic diverticulosis.  No active diverticulitis. No acute findings in the abdomen or pelvis. Electronically Signed   By: Rolm Baptise M.D.   On: 01/03/2022 20:05   DG Chest 2 View  Result Date: 01/03/2022 CLINICAL DATA:  Shortness of breath EXAM: CHEST - 2 VIEW COMPARISON:  12/06/2021 FINDINGS: Prior CABG. Heart and mediastinal contours are within normal limits. No focal opacities or effusions. No acute bony abnormality. IMPRESSION: No active cardiopulmonary disease. Electronically Signed   By: Rolm Baptise M.D.   On: 01/03/2022 19:25   ECHOCARDIOGRAM COMPLETE  Result Date: 12/07/2021    ECHOCARDIOGRAM REPORT   Patient Name:   Lindsey Pollard [facility] Date of Exam: 12/07/2021 Medical Rec #:  [id number]     Height:       59.0 in Accession #:    [id number]    Weight:       126.5 lb Date of Birth:  1941/01/12     BSA:          1.518 m Patient Age:    55 years      BP:  123/32 mmHg Patient Gender: F             HR:           56 bpm. Exam Location:  [facility] Procedure: 2D Echo, Cardiac Doppler and Color Doppler Indications:     Chest pain R07.9  History:         Patient has prior history of Echocardiogram examinations, most                  recent 06/01/2021. Previous Myocardial Infarction, Prior CABG;                  Risk Factors:Hypertension, Dyslipidemia and Diabetes.  Sonographer:     Sherrie Sport Referring Phys:  [id number] [place] TANG Diagnosing Phys: Yolonda Kida MD IMPRESSIONS  1. Left ventricular ejection fraction, by estimation, is 55 to 60%. The left ventricle has normal function. The left ventricle has no regional wall motion abnormalities. Left ventricular diastolic parameters are consistent with Grade I diastolic dysfunction (impaired relaxation).  2. Right ventricular systolic function is normal. The right ventricular size is normal.  3. The mitral valve is normal in structure. Moderate mitral valve regurgitation.  4. The aortic valve is normal in structure.  Aortic valve regurgitation is mild. FINDINGS  Left Ventricle: Left ventricular ejection fraction, by estimation, is 55 to 60%. The left ventricle has normal function. The left ventricle has no regional wall motion abnormalities. The left ventricular internal cavity size was normal in size. There is  no left ventricular hypertrophy. Left ventricular diastolic parameters are consistent with Grade I diastolic dysfunction (impaired relaxation). Right Ventricle: The right ventricular size is normal. No increase in right ventricular wall thickness. Right ventricular systolic function is normal. Left Atrium: Left atrial size was normal in size. Right Atrium: Right atrial size was normal in size. Pericardium: There is no evidence of pericardial effusion. Mitral Valve: The mitral valve is normal in structure. Moderate mitral valve regurgitation. MV peak gradient, 4.8 mmHg. The mean mitral valve gradient is 1.0 mmHg. Tricuspid Valve: The tricuspid valve is grossly normal. Tricuspid valve regurgitation is mild. Aortic Valve: The aortic valve is normal in structure. Aortic valve regurgitation is mild. Aortic regurgitation PHT measures 567 msec. Aortic valve mean gradient measures 10.0 mmHg. Aortic valve peak gradient measures 16.9 mmHg. Aortic valve area, by VTI  measures 1.12 cm. Pulmonic Valve: The pulmonic valve was normal in structure. Pulmonic valve regurgitation is not visualized. Aorta: The ascending aorta was not well visualized. IAS/Shunts: No atrial level shunt detected by color flow Doppler.  LEFT VENTRICLE PLAX 2D LVIDd:         4.40 cm   Diastology LVIDs:         3.10 cm   LV e' medial:    6.96 cm/s LV PW:         1.30 cm   LV E/e' medial:  11.2 LV IVS:        0.75 cm   LV e' lateral:   7.40 cm/s LVOT diam:     2.00 cm   LV E/e' lateral: 10.5 LV SV:         60 LV SV Index:   39 LVOT Area:     3.14 cm  RIGHT VENTRICLE RV Basal diam:  3.50 cm RV S prime:     10.60 cm/s TAPSE (M-mode): 1.5 cm LEFT ATRIUM              Index  RIGHT ATRIUM           Index LA diam:        3.20 cm 2.11 cm/m   RA Area:     12.70 cm LA Vol (A2C):   63.1 ml 41.56 ml/m  RA Volume:   29.50 ml  19.43 ml/m LA Vol (A4C):   46.0 ml 30.30 ml/m LA Biplane Vol: 53.3 ml 35.10 ml/m  AORTIC VALVE                     PULMONIC VALVE AV Area (Vmax):    1.15 cm      PV Vmax:        0.88 m/s AV Area (Vmean):   0.99 cm      PV Vmean:       57.400 cm/s AV Area (VTI):     1.12 cm      PV VTI:         0.194 m AV Vmax:           205.33 cm/s   PV Peak grad:   3.1 mmHg AV Vmean:          148.333 cm/s  PV Mean grad:   2.0 mmHg AV VTI:            0.532 m       RVOT Peak grad: 4 mmHg AV Peak Grad:      16.9 mmHg AV Mean Grad:      10.0 mmHg LVOT Vmax:         75.10 cm/s LVOT Vmean:        46.600 cm/s LVOT VTI:          0.190 m LVOT/AV VTI ratio: 0.36 AI PHT:            567 msec  AORTA Ao Root diam: 2.80 cm MITRAL VALVE                TRICUSPID VALVE MV Area (PHT): 2.26 cm     TR Peak grad:   38.2 mmHg MV Area VTI:   1.74 cm     TR Vmax:        309.00 cm/s MV Peak grad:  4.8 mmHg MV Mean grad:  1.0 mmHg     SHUNTS MV Vmax:       1.09 m/s     Systemic VTI:  0.19 m MV Vmean:      47.4 cm/s    Systemic Diam: 2.00 cm MV Decel Time: 335 msec     Pulmonic VTI:  0.220 m MV E velocity: 78.00 cm/s MV A velocity: 105.00 cm/s MV E/A ratio:  0.74 Dwayne D Callwood MD Electronically signed by Alwyn Pea MD Signature Date/Time: 12/07/2021/1:24:31 PM    Final    CT Angio Chest PE W/Cm &/Or Wo Cm  Result Date: 12/06/2021 CLINICAL DATA:  Pulmonary embolism (PE) suspected, high prob EXAM: CT ANGIOGRAPHY CHEST WITH CONTRAST TECHNIQUE: Multidetector CT imaging of the chest was performed using the standard protocol during bolus administration of intravenous contrast. Multiplanar CT image reconstructions and MIPs were obtained to evaluate the vascular anatomy. RADIATION DOSE REDUCTION: This exam was performed according to the departmental dose-optimization program which  includes automated exposure control, adjustment of the mA and/or kV according to patient size and/or use of iterative reconstruction technique. CONTRAST:  43mL OMNIPAQUE IOHEXOL 350 MG/ML SOLN COMPARISON:  None Available. FINDINGS: Cardiovascular: Satisfactory opacification of the pulmonary arteries to the segmental level. No evidence  of pulmonary embolism. Mild cardiomegaly. No pericardial disease. Prior CABG. Extensive native coronary artery calcification. Reflux of contrast into the IVC and hepatic veins. Moderate atherosclerosis of the thoracic aorta. Mediastinum/Nodes: Mildly enlarged precarinal lymph node measuring 1.1 cm, likely reactive. No axillary adenopathy. No hilar adenopathy. The thyroid is unremarkable. Small hiatal hernia. Lungs/Pleura: There is interlobular septal thickening and ground-glass opacities bilaterally. No airspace consolidation. There is a 4 mm pulmonary nodule in the right lung base (series 5, image 94). Upper Abdomen: No acute abnormality. Musculoskeletal: No chest wall abnormality. No acute or significant osseous findings. Review of the MIP images confirms the above findings. IMPRESSION: No evidence of pulmonary embolism.  Mild pulmonary edema. 4 mm pulmonary nodule in the right lung base (series 5, image 94). No routine follow-up imaging is recommended per Fleischner guidelines. These guidelines do not apply to immunocompromised patients and patients with cancer. Follow up in patients with significant comorbidities as clinically warranted. Reference: Radiology. 2017; 284(1):228-43. Electronically Signed   By: Caprice Renshaw M.D.   On: 12/06/2021 09:54   DG Chest 2 View  Result Date: 12/06/2021 CLINICAL DATA:  81 year old female with history of chest pain. EXAM: CHEST - 2 VIEW COMPARISON:  Chest x-ray 06/01/2021. FINDINGS: There is cephalization of the pulmonary vasculature and slight indistinctness of the interstitial markings suggestive of mild pulmonary edema. No pleural  effusions. Mild cardiomegaly. Upper mediastinal contours are within normal limits. Atherosclerotic calcifications are noted in the thoracic aorta. Status post median sternotomy. IMPRESSION: 1. The appearance the chest suggests mild congestive heart failure, as above. 2. Aortic atherosclerosis. Electronically Signed   By: Trudie Reed M.D.   On: 12/06/2021 09:04     TELEMETRY reviewed by me: Currently sinus bradycardia with rate 40s.  ASSESSMENT AND PLAN:  Lindsey Pollard is an 47yoF with a PMH of CAD s/p CABG x2 in 2018 (patent LIMA to LAD,  occluded SVG to OM1 by [facility] 05/2021), h/o PCI x1 distal RCA (50% ISR by [place] [facility] ischemic CM with LVEF 55-60%, g1dd, mod MR 11/2021, CKD 3, hyperlipidemia, type 2 diabetes, CAD, GERD who presented to [facility] ED 01/03/2022 with shortness of breath.  Cardiology is consulted because she developed VF arrest while in the ED.   #VF arrest with successful resuscitation s/p 1 round of CPR and defibrillation #NSTEMI with history of significant CAD s/p CABG x2 with known occluded SVG to OM1, LIMA to LAD patent by [facility] 05/2021 #Ischemic cardiomyopathy #Acute hypoxic respiratory failure -Agree with current therapy and appreciate ventilator management per primary team  -S/p 325 mg aspirin, 300 mg clopidogrel, continue DAPT with 81 mg aspirin and 75 mg clopidogrel daily indefinitely -Continue atorvastatin 80 mg daily -Continue heparin drip for 48 hours -S/p amiodarone drip, discontinued the morning of 7/13 due to bradycardia -Started on Levophed to maintain MAP greater than 65 by primary team, please wean this as able -Restart metoprolol and isosorbide as blood pressure permits -Limited echo resulted with new wall motion abnormality as above -Continue discussions with on-call cardiologist Dr. Gwen Pounds who does not recommend heart catheterization at this time due to difficulties with vascular access, risk of worsening AKI, and ongoing anemia.  See further documentation  below -We will continue medical management with the above, hopefully the patient will be able to wean from ventilator and and participate in further discussions, she has historically favored medical management due to significant risks and goals of care  This patient's plan of care was discussed and created with Dr. Gwen Pounds and he is  in agreement.  Signed: Tristan Schroeder , PA-C 01/05/2022, 8:25 AM [facility] Cardiology   The patient has been interviewed and examined. I agree with assessment and plan above. Serafina Royals MD [facility]

## 2022-01-06 DIAGNOSIS — I214 Non-ST elevation (NSTEMI) myocardial infarction: Principal | ICD-10-CM

## 2022-01-06 DIAGNOSIS — I469 Cardiac arrest, cause unspecified: Secondary | ICD-10-CM | POA: Diagnosis not present

## 2022-01-06 DIAGNOSIS — E663 Overweight: Secondary | ICD-10-CM | POA: Diagnosis present

## 2022-01-06 DIAGNOSIS — J9601 Acute respiratory failure with hypoxia: Secondary | ICD-10-CM

## 2022-01-06 DIAGNOSIS — N179 Acute kidney failure, unspecified: Secondary | ICD-10-CM

## 2022-01-06 LAB — CBC
HCT: 26.7 % — ABNORMAL LOW (ref 36.0–46.0)
Hemoglobin: 8.7 g/dL — ABNORMAL LOW (ref 12.0–15.0)
MCH: 31.5 pg (ref 26.0–34.0)
MCHC: 32.6 g/dL (ref 30.0–36.0)
MCV: 96.7 fL (ref 80.0–100.0)
Platelets: 169 10*3/uL (ref 150–400)
RBC: 2.76 MIL/uL — ABNORMAL LOW (ref 3.87–5.11)
RDW: 14.6 % (ref 11.5–15.5)
WBC: 10.8 10*3/uL — ABNORMAL HIGH (ref 4.0–10.5)
nRBC: 0 % (ref 0.0–0.2)

## 2022-01-06 LAB — BASIC METABOLIC PANEL
Anion gap: 9 (ref 5–15)
BUN: 27 mg/dL — ABNORMAL HIGH (ref 8–23)
CO2: 25 mmol/L (ref 22–32)
Calcium: 8.4 mg/dL — ABNORMAL LOW (ref 8.9–10.3)
Chloride: 103 mmol/L (ref 98–111)
Creatinine, Ser: 1.22 mg/dL — ABNORMAL HIGH (ref 0.44–1.00)
GFR, Estimated: 45 mL/min — ABNORMAL LOW (ref >60)
Glucose, Bld: 141 mg/dL — ABNORMAL HIGH (ref 70–99)
Potassium: 3.9 mmol/L (ref 3.5–5.1)
Sodium: 137 mmol/L (ref 135–145)

## 2022-01-06 LAB — GLUCOSE, CAPILLARY
Glucose-Capillary: 123 mg/dL — ABNORMAL HIGH (ref 70–99)
Glucose-Capillary: 155 mg/dL — ABNORMAL HIGH (ref 70–99)
Glucose-Capillary: 99 mg/dL (ref 70–99)
Glucose-Capillary: 111 mg/dL — ABNORMAL HIGH (ref 70–99)

## 2022-01-06 LAB — MAGNESIUM: Magnesium: 2.1 mg/dL (ref 1.7–2.4)

## 2022-01-06 LAB — PHOSPHORUS: Phosphorus: 3.6 mg/dL (ref 2.5–4.6)

## 2022-01-06 MED ORDER — PANTOPRAZOLE SODIUM 40 MG PO TBEC
40.0000 mg | DELAYED_RELEASE_TABLET | Freq: Every day | ORAL | Status: DC
  Administered 2022-01-06 – 2022-01-09 (×4): 40 mg via ORAL
  Filled 2022-01-06 (×4): qty 1

## 2022-01-06 MED ORDER — CLOPIDOGREL BISULFATE 75 MG PO TABS
75.0000 mg | ORAL_TABLET | Freq: Every day | ORAL | Status: DC
  Administered 2022-01-06 – 2022-01-09 (×4): 75 mg via ORAL
  Filled 2022-01-06 (×4): qty 1

## 2022-01-06 MED ORDER — METOPROLOL TARTRATE 50 MG PO TABS: 50.0000 mg | ORAL_TABLET | Freq: Two times a day (BID) | ORAL | Status: DC

## 2022-01-06 MED ORDER — ENOXAPARIN SODIUM 30 MG/0.3ML IJ SOSY
30.0000 mg | PREFILLED_SYRINGE | INTRAMUSCULAR | Status: DC
  Administered 2022-01-06 – 2022-01-08 (×3): 30 mg via SUBCUTANEOUS
  Filled 2022-01-06 (×3): qty 0.3

## 2022-01-06 MED ORDER — DM-GUAIFENESIN ER 30-600 MG PO TB12
1.0000 | ORAL_TABLET | Freq: Two times a day (BID) | ORAL | Status: DC
  Administered 2022-01-06 – 2022-01-09 (×7): 1 via ORAL
  Filled 2022-01-06 (×7): qty 1

## 2022-01-06 MED ORDER — METOPROLOL TARTRATE 25 MG PO TABS
25.0000 mg | ORAL_TABLET | Freq: Two times a day (BID) | ORAL | Status: DC
  Administered 2022-01-06 – 2022-01-09 (×7): 25 mg via ORAL
  Filled 2022-01-06 (×7): qty 1

## 2022-01-06 MED ORDER — ASPIRIN 81 MG PO TBEC
81.0000 mg | DELAYED_RELEASE_TABLET | Freq: Every day | ORAL | Status: DC
  Administered 2022-01-06 – 2022-01-09 (×4): 81 mg via ORAL
  Filled 2022-01-06 (×4): qty 1

## 2022-01-06 NOTE — [place] FL2 (Signed)
[place] MEDICAID FL2 LEVEL OF CARE SCREENING TOOL     IDENTIFICATION  Patient Name: Lindsey Pollard Birthdate: [birth year] Sex: female Admission Date (Current Location): 01/03/2022  [facility] and [place] Number:  Chiropodist and Address:  [facility], [address], [place], [place] [address]      Provider Number: [id number]  Attending Physician Name and Address:  Hollice Espy, MD  Relative Name and Phone Number:  Loleta Books [facility])   [phone] [facility])    Current Level of Care: Hospital Recommended Level of Care: Skilled Nursing Facility Prior Approval Number:    Date Approved/Denied:   PASRR Number: [id number]  Discharge Plan:      Current Diagnoses: Patient Active Problem List   Diagnosis Date Noted   Overweight (BMI 25.0-29.9) 01/06/2022   Cardiac arrest with successful resuscitation (HCC) 01/03/2022   Unstable angina (HCC) 12/07/2021   NSTEMI (non-ST elevated myocardial infarction) (HCC) 12/06/2021   Chronic kidney disease, stage 3a (HCC) 12/06/2021   Lung nodule 12/06/2021   Type II diabetes mellitus with renal manifestations (HCC) 12/06/2021   HTN (hypertension) 12/06/2021   HLD (hyperlipidemia) 12/06/2021   AKI (acute kidney injury) (HCC) in the setting of stage IIIa chronic    Anemia    Acute respiratory failure with hypoxia (HCC)    Chronic diastolic CHF (congestive heart failure) (HCC)    Fungal infection of the groin    Fungal infection of skin    Non-STEMI (non-ST elevated myocardial infarction) (HCC) 06/01/2021   Essential hypertension 06/01/2021   Carotid stenosis 10/26/2020   Atherosclerosis of native arteries of extremity with intermittent claudication (HCC) 09/16/2020   Carotid bruit present 09/16/2020   Healthcare maintenance 01/06/2019   Aortic calcification (HCC) 06/19/2017   Ischemic cardiomyopathy 06/19/2017   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/17/2017   PVD (peripheral vascular disease) (HCC) 06/14/2017    Morbid obesity due to excess calories (HCC) 06/07/2015   Pseudophakia of right eye 11/10/2014   Bilateral hearing loss 10/05/2014   CAD (coronary artery disease) 10/05/2014   GERD (gastroesophageal reflux disease) 10/05/2014   RLS (restless legs syndrome) 09/09/2014   Mixed hyperlipidemia 08/27/2014   Type 2 diabetes mellitus with hyperlipidemia (HCC) 08/27/2014    Orientation RESPIRATION BLADDER Height & Weight     Self, Time, Situation, Place  O2 Indwelling catheter Weight: 132 lb 0.9 oz (59.9 kg) Height:  4\' 11"  (149.9 cm)  BEHAVIORAL SYMPTOMS/MOOD NEUROLOGICAL BOWEL NUTRITION STATUS        Diet (heart)  AMBULATORY STATUS COMMUNICATION OF NEEDS Skin   Limited Assist Verbally Bruising                       Personal Care Assistance Level of Assistance  Bathing, Feeding, Dressing Bathing Assistance: Limited assistance Feeding assistance: Limited assistance Dressing Assistance: Limited assistance     Functional Limitations Info             SPECIAL CARE FACTORS FREQUENCY  PT (By licensed PT), OT (By licensed OT)     PT Frequency: 5 times per week OT Frequency: 5 times per week            Contractures      Additional Factors Info  Code Status, Allergies Code Status Info: full Allergies Info: tetanus toxoids, penicillin g           Current Medications (01/06/2022):  This is the current hospital active medication list Current Facility-Administered Medications  Medication Dose Route Frequency Provider Last  Rate Last Admin   0.9 %  sodium chloride infusion  250 mL Intravenous Continuous Tressie Ellis, RPH   Held at 01/03/22 2339   albuterol (PROVENTIL) (2.5 MG/3ML) 0.083% nebulizer solution 2.5 mg  2.5 mg Nebulization Q2H PRN Jimmye Norman, NP       amiodarone (NEXTERONE PREMIX) 360-4.14 MG/200ML-% (1.8 mg/mL) IV infusion  30 mg/hr Intravenous Continuous Concha Se, MD   Stopped at 01/04/22 0507   aspirin EC tablet 81 mg  81 mg Oral Daily  Tressie Ellis, RPH   81 mg at 01/06/22 1022   Chlorhexidine Gluconate Cloth 2 % PADS 6 each  6 each Topical Daily Vida Rigger, MD   6 each at 01/06/22 1024   clopidogrel (PLAVIX) tablet 75 mg  75 mg Oral Daily Tressie Ellis, RPH   75 mg at 01/06/22 1022   dextromethorphan-guaiFENesin (MUCINEX DM) 30-600 MG per 12 hr tablet 1 tablet  1 tablet Oral BID Rust-Chester, Cecelia Byars, NP   1 tablet at 01/06/22 0400   docusate sodium (COLACE) capsule 100 mg  100 mg Oral BID PRN Jimmye Norman, NP       enoxaparin (LOVENOX) injection 30 mg  30 mg Subcutaneous Q24H Patel, Kishan S, RPH       feeding supplement (ENSURE ENLIVE / ENSURE PLUS) liquid 237 mL  237 mL Oral TID BM Aleskerov, Fuad, MD   237 mL at 01/06/22 1024   insulin aspart (novoLOG) injection 0-5 Units  0-5 Units Subcutaneous QHS Harlon Ditty D, NP       insulin aspart (novoLOG) injection 0-9 Units  0-9 Units Subcutaneous TID WC Harlon Ditty D, NP   2 Units at 01/06/22 1200   metoprolol tartrate (LOPRESSOR) tablet 25 mg  25 mg Oral BID Lamar Blinks, MD   25 mg at 01/06/22 1354   multivitamin with minerals tablet 1 tablet  1 tablet Oral Daily Vida Rigger, MD   1 tablet at 01/06/22 1027   nitroGLYCERIN (NITROSTAT) SL tablet 0.4 mg  0.4 mg Sublingual Q5 min PRN Gilles Chiquito, MD   0.4 mg at 01/03/22 2111   norepinephrine (LEVOPHED) 4mg  in (0.016 mg/mL) premix infusion  2-10 mcg/min Intravenous Titrated , RPH   Stopped at 01/05/22 1640   pantoprazole (PROTONIX) EC tablet 40 mg  40 mg Oral Daily 01/07/22, RPH   40 mg at 01/06/22 1022   polyethylene glycol (MIRALAX / GLYCOLAX) packet 17 g  17 g Oral Daily PRN 01/08/22, NP         Discharge Medications: Please see discharge summary for a list of discharge medications.  Relevant Imaging Results:  Relevant Lab Results:   Additional Information SS #: [id number]  Kylar Speelman E Jarely Juncaj, LCSW

## 2022-01-06 NOTE — Assessment & Plan Note (Addendum)
Previously improving although had been been trending back upward over the last few days before discharge.  Patient restarted on her home Demadex and Aldactone and creatinine back to baseline.  Patient will have labs rechecked by her PCP in the next month.  With renal function normalized and blood pressure slightly trending upward, will restart her Zestril upon discharge.

## 2022-01-06 NOTE — Evaluation (Signed)
Physical Therapy Evaluation Patient Details Name: Lindsey Pollard MRN: [id number] DOB: 14-Jun-1941 Today's Date: 01/06/2022  History of Present Illness  Pt is an 81 y/o F admitted on 01/03/22 after presenting to the ED with c/o SOB & a fall 3 days ago, hitting the side of her head without LOC. Pt was placed on 2L via [place] but later c/o R sided chest pain & shortly after went into v-fib arrest without a pulse. CPR/ACLS initiated, pt was intubated for airway protection. Pt was extubated on 01/05/22. PMH: CAD s/p CABG, HTN, HLD, DM, PAD, CHF, CKD-3A, RLS  Clinical Impression  Pt seen for PT evaluation with co-tx with OT. Prior to admission pt was living in a level entry condo with her friend, ambulating with SPC in the community but no AD in the home. On this date, pt requires min assist for supine>sit with HOB elevated & bed rails & min assist +2 HHA for stand pivot to recliner. Pt endorses dizziness upon standing but tolerates standing 1-2 minutes for BP assessment. Pt would benefit from STR upon d/c to maximize independence with functional mobility & reduce fall risk prior to return home.    Pt received on 2L/min but weaned & left on room air with SpO2 >/= 90% BP checked: Sitting EOB: 146/78 mmHg (MAP 96) Standing: 121/49 mmHg (MAP 70) Sitting in recliner: 136/58 mmHg (MAP 81) Max HR of 130 bpm in standing    Recommendations for follow up therapy are one component of a multi-disciplinary discharge planning process, led by the attending physician.  Recommendations may be updated based on patient status, additional functional criteria and insurance authorization.  Follow Up Recommendations Skilled nursing-short term rehab (<3 hours/day) Can patient physically be transported by private vehicle: No    Assistance Recommended at Discharge Frequent or constant Supervision/Assistance  Patient can return home with the following  A lot of help with walking and/or transfers;A lot of help with  bathing/dressing/bathroom;Assistance with cooking/housework;Help with stairs or ramp for entrance    Equipment Recommendations None recommended by PT (TBD in next venue)  Recommendations for Other Services       Functional Status Assessment Patient has had a recent decline in their functional status and demonstrates the ability to make significant improvements in function in a reasonable and predictable amount of time.     Precautions / Restrictions Precautions Precautions: Fall Restrictions Weight Bearing Restrictions: No      Mobility  Bed Mobility Overal bed mobility: Needs Assistance Bed Mobility: Supine to Sit     Supine to sit: Min assist, HOB elevated          Transfers Overall transfer level: Needs assistance Equipment used: 1 person hand held assist, 2 person hand held assist Transfers: Sit to/from Stand, Bed to chair/wheelchair/BSC Sit to Stand: Min guard, Min assist   Step pivot transfers: Min assist (+2 HHA)            Ambulation/Gait                  Stairs            Wheelchair Mobility    Modified Rankin (Stroke Patients Only)       Balance Overall balance assessment: Needs assistance Sitting-balance support: Feet supported, Bilateral upper extremity supported Sitting balance-Leahy Scale: Fair     Standing balance support: During functional activity, Bilateral upper extremity supported Standing balance-Leahy Scale: Poor  Pertinent Vitals/Pain Pain Assessment Pain Assessment: Faces Faces Pain Scale: No hurt    Home Living Family/patient expects to be discharged to:: Private residence Living Arrangements: Non-relatives/Friends Available Help at Discharge: Friend(s);Available PRN/intermittently Type of Home:  (condo) Home Access: Level entry       Home Layout: One level Home Equipment: Cane - single point Additional Comments: Lives with friend who recently had a stroke.  Uses uber/lyft to go get to/from grocery store.    Prior Function Prior Level of Function : Independent/Modified Independent             Mobility Comments: Uses SPC for community mobility, no AD for in home ambulation.       Hand Dominance        Extremity/Trunk Assessment   Upper Extremity Assessment Upper Extremity Assessment: Generalized weakness    Lower Extremity Assessment Lower Extremity Assessment: Generalized weakness       Communication   Communication: HOH  Cognition Arousal/Alertness: Awake/alert Behavior During Therapy: Flat affect Overall Cognitive Status: Within Functional Limits for tasks assessed                                          General Comments      Exercises     Assessment/Plan    PT Assessment Patient needs continued PT services  PT Problem List Decreased strength;Cardiopulmonary status limiting activity;Decreased activity tolerance;Decreased balance;Decreased mobility;Decreased knowledge of precautions;Decreased safety awareness;Decreased knowledge of use of DME       PT Treatment Interventions DME instruction;Balance training;Therapeutic exercise;Gait training;Neuromuscular re-education;Functional mobility training;Cognitive remediation;Therapeutic activities;Patient/family education    PT Goals (Current goals can be found in the Care Plan section)  Acute Rehab PT Goals Patient Stated Goal: get better, go home PT Goal Formulation: With patient Time For Goal Achievement: 01/20/22 Potential to Achieve Goals: Good    Frequency Min 2X/week     Co-evaluation PT/OT/SLP Co-Evaluation/Treatment: Yes Reason for Co-Treatment: Complexity of the patient's impairments (multi-system involvement) PT goals addressed during session: Balance;Mobility/safety with mobility         AM-PAC PT "6 Clicks" Mobility  Outcome Measure Help needed turning from your back to your side while in a flat bed without using bedrails?:  A Little Help needed moving from lying on your back to sitting on the side of a flat bed without using bedrails?: A Little Help needed moving to and from a bed to a chair (including a wheelchair)?: A Little Help needed standing up from a chair using your arms (e.g., wheelchair or bedside chair)?: A Little Help needed to walk in hospital room?: A Lot Help needed climbing 3-5 steps with a railing? : A Lot 6 Click Score: 16    End of Session   Activity Tolerance: Patient tolerated treatment well Patient left: in chair;with call bell/phone within reach Nurse Communication: Mobility status (O2) PT Visit Diagnosis: Unsteadiness on feet (R26.81);Muscle weakness (generalized) (M62.81);Difficulty in walking, not elsewhere classified (R26.2)    Time: 5284-1324 PT Time Calculation (min) (ACUTE ONLY): 11 min   Charges:   PT Evaluation $PT Eval Moderate Complexity: 1 Mod          Aleda Grana, PT, DPT 01/06/22, 12:31 PM   Sandi Mariscal 01/06/2022, 12:28 PM

## 2022-01-06 NOTE — Assessment & Plan Note (Signed)
Resuscitated.  Currently off of ventilator and pressor support

## 2022-01-06 NOTE — TOC Progression Note (Signed)
Transition of Care [facility]) - Progression Note    Patient Details  Name: NOREEN MACKINTOSH MRN: [id number] Date of Birth: [birth year]  Transition of [facility]) CM/SW Contact  Liliana Cline, LCSW Phone Number: 01/06/2022, 2:07 PM  Clinical Narrative:    Spoke to patient's nephew regarding SNF rec. He is agreeable to SNF. He would like to review bed offers when available to choose a SNF. SNF workup started.   Expected Discharge Plan: Skilled Nursing Facility Barriers to Discharge: Continued Medical Work up  Expected Discharge Plan and Services Expected Discharge Plan: Skilled Nursing Facility       Living arrangements for the past 2 months: Single Family Home                                       Social Determinants of Health (SDOH) Interventions    Readmission Risk Interventions    01/05/2022    5:09 PM  Readmission Risk Prevention Plan  Transportation Screening Complete  PCP or Specialist Appt within 3-5 Days Complete  Social Work Consult for Recovery Care Planning/Counseling Not Complete  SW consult not completed comments NA  Palliative Care Screening Not Applicable  Medication Review Ocean[facility]) Referral to Pharmacy

## 2022-01-06 NOTE — Progress Notes (Signed)
Micheline Rough, NP notified of patients c/o cough that she says developed "after they removed that tube". Acknowledged. See new order.

## 2022-01-06 NOTE — Assessment & Plan Note (Signed)
Meets criteria BMI greater than 25 

## 2022-01-06 NOTE — Assessment & Plan Note (Signed)
Echocardiogram notes new hypokinesis as compared to echocardiogram from a month ago.  Cardiology following.  Recommendation for outpatient follow-up.  Continue to monitor renal function.  Cardiac rehab.

## 2022-01-06 NOTE — Consult Note (Signed)
PHARMACY CONSULT NOTE - FOLLOW UP  Pharmacy Consult for Electrolyte Monitoring and Replacement   Recent Labs: Potassium (mmol/L)  Date Value  01/06/2022 3.9   Magnesium (mg/dL)  Date Value  63/89/3734 2.1   Calcium (mg/dL)  Date Value  28/76/8115 8.4 (L)   Albumin (g/dL)  Date Value  72/62/0355 3.4 (L)   Phosphorus (mg/dL)  Date Value  97/41/6384 3.6   Sodium (mmol/L)  Date Value  01/06/2022 137   Assessment: 81yo Female w/ h/o CAD (s/p CABG x2 2018), HTN, HLD, DM, PAD, CHF (EF 45-50%>55-60%), CKD-3a, and RLS, who presents to the ED with SOB worse w/ exertion w/ no associated symptoms. In the ED pt went into Vfib arrest w/ ROSC after 1 round of CPR/Defib. Patient reported a fall 3 days ago hitting the side of her head without LOC. Pharmacy consulted for mgmt of electrolytes.  Goal of Therapy:  Lytes WNL K>4 & Mg >2  Plan:  Scr trending down. K+ stable.  No replacement needed.  F/u with AM labs.   Ronnald Ramp ,PharmD Clinical Pharmacist 01/06/2022 10:09 AM

## 2022-01-06 NOTE — Progress Notes (Signed)
[facility] Progress Note  Patient: Lindsey Pollard    YBO:[id number]  DOA: 01/03/2022    Date of Service: the patient was seen and examined on 01/06/2022  Brief hospital course: Patient is an 81 year old female with past medical history of CAD status post CABG x2 in 2018 with history of PCI as well as ischemic cardiomyopathy, diabetes mellitus and stage III chronic kidney disease who presented to the emergency room on 7/12 with shortness of breath.  Following admission, patient started complaining of right-sided chest pain and then went into V-fib arrest and was pulseless.  CPR was initiated and patient was resuscitated, intubated for airway protection.  Postcardiac arrest EKG noted persistent diffuse ST depression and cardiology consulted.  Patient able to be extubated by 7/14.  Echocardiogram noted new moderate hypokinesis of the left ventricle mid apical and anterolateral wall.  Patient did require pressor support on morning of 7/14 to maintain MAP, but this was able to be weaned off successfully by 7/14 evening.  Today, patient is awake and alert.  Interactive and appropriate.  Comfortable.  Renal function improving and troponin continues to trend downward.  Assessment and Plan: Assessment and Plan: * NSTEMI (non-ST elevated myocardial infarction) (HCC) Echocardiogram notes new hypokinesis as compared to echocardiogram from a month ago.  Cardiology following and now that she is awake and much more stable with improving renal function, decision can be made as far as intervention  Cardiac arrest with successful resuscitation [facility]) Resuscitated.  Currently off of ventilator and pressor support  Acute respiratory failure with hypoxia (HCC) Weaned off of ventilator and high flow oxygen.  Secondary to non-STEMI.  Now on 2 L nasal cannula.  Monitor for heart failure.  AKI (acute kidney injury) (HCC) in the setting of stage IIIa chronic Continues to improve.  Type II diabetes mellitus with  renal manifestations (HCC) Well-controlled.  A1c a month ago at 6.8.  Sliding scale.  Chronic diastolic CHF (congestive heart failure) (HCC) Echocardiogram a month ago noted grade 1 diastolic dysfunction.  Monitor for decompensation  HTN (hypertension) Antihypertensives held.  Initially required pressor support on 7/14, possibly from cardiogenic shock which has since resolved  Overweight (BMI 25.0-29.9) Meets criteria BMI greater than 25       Body mass index is 26.67 kg/m.  Nutrition Problem: Moderate Malnutrition Etiology: chronic illness     Consultants: Cardiology Critical care  Procedures: Echocardiogram Intubation  Antimicrobials: None  Code Status: Full code   Subjective: Patient doing okay, denies any chest pain or shortness of breath  Objective: Minimal tachycardia Vitals:   01/06/22 1100 01/06/22 1200  BP: (!) 135/58 (!) 134/56  Pulse: (!) 108 (!) 113  Resp: 20 (!) 24  Temp:    SpO2: 100% 92%    Intake/Output Summary (Last 24 hours) at 01/06/2022 1300 Last data filed at 01/06/2022 1007 Gross per 24 hour  Intake 481.2 ml  Output 1125 ml  Net -643.8 ml   Filed Weights   01/04/22 0300 01/05/22 0350 01/06/22 0438  Weight: 57.8 kg 56.7 kg 59.9 kg   Body mass index is 26.67 kg/m.  Exam:  General: Alert and oriented x2, no acute distress HEENT: Normocephalic, atraumatic, mucous membranes are moist Cardiovascular: Regular rate and rhythm, S1-S2, 2/6 systolic ejection murmur Respiratory: Clear to auscultation bilaterally Abdomen: Soft, nontender, nondistended, positive bowel sounds Musculoskeletal: No clubbing or cyanosis, trace pitting edema Skin: No skin breaks, tears or lesions Psychiatry: Appropriate, no evidence of psychoses Neurology: No focal deficits  Data Reviewed: Creatinine  improved to 1.22  Disposition:  Status is: Inpatient Remains inpatient appropriate because: Further weaning from oxygen, initiation of cardiac rehab     Anticipated discharge date: 7/17  Family Communication: Left message with daughters DVT Prophylaxis: enoxaparin (LOVENOX) injection 30 mg Start: 01/06/22 2200    Author: Hollice Espy ,MD 01/06/2022 1:00 PM  To reach On-call, see care teams to locate the attending and reach out via [email or web address]. Between 7PM-7AM, please contact night-coverage If you still have difficulty reaching the attending provider, please page the [facility] (Director on Call) for [facility] on amion for assistance.

## 2022-01-06 NOTE — Assessment & Plan Note (Addendum)
Echocardiogram a month ago noted grade 1 diastolic dysfunction.  Monitor for decompensation.  Noted increase in creatinine, possibly from some worsening heart failure.  Restarting her Aldactone and Demadex

## 2022-01-06 NOTE — Progress Notes (Signed)
[facility] CLINIC CARDIOLOGY CONSULT NOTE       Patient ID: Lindsey Pollard MRN: [id number] DOB/AGE: March 18, 1941 81 y.o.  Admit date: 01/03/2022 Referring Physician Blair Hailey, NP  Primary Physician Dr. Einar Crow Primary Cardiologist Dr. Juliann Pares Reason for Consultation VF arrest   HPI: Lindsey Pollard is an 36yoF with a PMH of CAD s/p CABG x2 in 2018 (patent LIMA to LAD,  occluded SVG to OM1 by [facility] 05/2021), h/o PCI x1 distal RCA (50% ISR by [facility] 05/2021), ischemic CM with LVEF 55-60%, g1dd, mod MR 11/2021, CKD 3, hyperlipidemia, type 2 diabetes, CAD, GERD who presented to [facility] ED 01/03/2022 with shortness of breath.  Cardiology is consulted because she developed VF arrest while in the ED.    Interval History:  - echo with new moderate hypokinesis of LV mid apical, anterolateral wall - troponin peaked   at 6100 -Patient feels much better today and is slowly improving but does have some tachycardia at this time  ROS limited by  patient sedation.   Past Medical History:  Diagnosis Date   Chest pain 12/06/2021   Diabetes mellitus without complication (HCC)    Hyperlipidemia    Hypertension    Myocardial infarction [facility])     Past Surgical History:  Procedure Laterality Date   CARDIAC SURGERY     bypass   CORONARY/GRAFT ACUTE MI REVASCULARIZATION N/A 06/01/2021   Procedure: Coronary/Graft Acute MI Revascularization;  Surgeon: Alwyn Pea, MD;  Location: [facility] INVASIVE CV LAB;  Service: Cardiovascular;  Laterality: N/A;   EYE SURGERY     cataract extraction   LEFT HEART CATH AND CORONARY ANGIOGRAPHY N/A 06/01/2021   Procedure: LEFT HEART CATH AND CORONARY ANGIOGRAPHY;  Surgeon: Alwyn Pea, MD;  Location: [facility] INVASIVE CV LAB;  Service: Cardiovascular;  Laterality: N/A;    Medications Prior to Admission  Medication Sig Dispense Refill Last Dose   acetaminophen (TYLENOL) 325 MG tablet Take 650 mg by mouth at bedtime.   Past Week   albuterol (VENTOLIN HFA) 108 (90  Base) MCG/ACT inhaler Inhale 1-2 puffs into the lungs every 4 (four) hours as needed for shortness of breath.   Past Week   aspirin 81 MG EC tablet Take by mouth.   Past Week   atorvastatin (LIPITOR) 80 MG tablet Take 80 mg by mouth every evening.   Past Week   baclofen (LIORESAL) 10 MG tablet Take 10 mg by mouth 3 (three) times daily.   Past Week   Cholecalciferol 25 MCG (1000 UT) capsule Take 1,000 Units by mouth 2 (two) times daily.   Past Week   clopidogrel (PLAVIX) 75 MG tablet Take 1 tablet (75 mg total) by mouth daily. 30 tablet 0 Past Week   insulin glargine (LANTUS) 100 UNIT/ML injection Inject 8 Units into the skin at bedtime.   Past Week   insulin lispro (HUMALOG) 100 UNIT/ML KwikPen Inject 0-2 Units into the skin 3 (three) times daily.   Past Week   ipratropium-albuterol (DUONEB) 0.5-2.5 (3) MG/3ML SOLN Take 3 mLs by nebulization every 6 (six) hours as needed (shortness of breath).   Past Week   isosorbide mononitrate (IMDUR) 30 MG 24 hr tablet Take 1 tablet (30 mg total) by mouth daily. 30 tablet 1 Past Week   lisinopril (ZESTRIL) 20 MG tablet Take 1 tablet (20 mg total) by mouth daily. 30 tablet 1 Past Week   metoprolol succinate (TOPROL-XL) 50 MG 24 hr tablet Take 50 mg by mouth daily.   Past Week  Multiple Vitamin (MULTIVITAMIN WITH MINERALS) TABS tablet Take 1 tablet by mouth daily.   Past Week   ondansetron (ZOFRAN-ODT) 4 MG disintegrating tablet Take 4 mg by mouth every 8 (eight) hours as needed.   Past Week   senna (SENOKOT) 8.6 MG tablet Take 2 tablets by mouth at bedtime.   Past Week   SPIRIVA HANDIHALER 18 MCG inhalation capsule Place 1 capsule into inhaler and inhale daily as needed for shortness of breath.   Past Week   spironolactone (ALDACTONE) 25 MG tablet Take 12.5 mg by mouth daily.   Past Week   torsemide (DEMADEX) 20 MG tablet Take 20 mg by mouth daily.   Past Week   nitroGLYCERIN (NITROSTAT) 0.4 MG SL tablet Place 0.4 mg under the tongue every 5 (five) minutes as  needed for chest pain.    at prn   nystatin (MYCOSTATIN/NYSTOP) powder Apply three times a day under skin folds in groin (Patient not taking: Reported on 01/04/2022) 60 g 0 Not Taking   Social History   Socioeconomic History   Marital status: Divorced    Spouse name: Not on file   Number of children: Not on file   Years of education: Not on file   Highest education level: Not on file  Occupational History   Not on file  Tobacco Use   Smoking status: Former    Types: Cigarettes   Smokeless tobacco: Never  Substance and Sexual Activity   Alcohol use: Not Currently   Drug use: Not Currently   Sexual activity: Not on file  Other Topics Concern   Not on file  Social History Narrative   Not on file   Social Determinants of Health   Financial Resource Strain: Not on file  Food Insecurity: Not on file  Transportation Needs: Not on file  Physical Activity: Not on file  Stress: Not on file  Social Connections: Not on file  Intimate Partner Violence: Not on file    Family History  Problem Relation Age of Onset   Congestive Heart Failure Mother    Diabetes Father    Breast cancer Neg Hx       PHYSICAL EXAM General: Elderly and critically ill appearing Caucasian female, intubated and minimally sedated in the ICU.   HEENT:  Normocephalic and atraumatic. Neck:  No JVD.  Lungs: Mechanically ventilated coarse breath sounds anteriorly. Heart: Bradycardic but regular, S1-S2 present with 2/6 systolic murmur abdomen: Non-distended appearing.  Msk: Normal strength and tone for age. Extremities: Warm and well perfused. No clubbing, cyanosis.  No peripheral edema.  Safety mitts on bilateral hands Neuro: Intubated and sedated Psych: Intubated and sedated  Labs:   Lab Results  Component Value Date   WBC 10.8 (H) 01/06/2022   HGB 8.7 (L) 01/06/2022   HCT 26.7 (L) 01/06/2022   MCV 96.7 01/06/2022   PLT 169 01/06/2022    Recent Labs  Lab 01/03/22 2319 01/04/22 0514  01/06/22 0513  NA 140   < > 137  K 3.1*   < > 3.9  CL 101   < > 103  CO2 26   < > 25  BUN 35*   < > 27*  CREATININE 2.06*   < > 1.22*  CALCIUM 8.5*   < > 8.4*  PROT 5.4*  --   --   BILITOT 1.2  --   --   ALKPHOS 111  --   --   ALT 146*  --   --   AST  245*  --   --   GLUCOSE 179*   < > 141*   < > = values in this interval not displayed.    No results found for: "CKTOTAL", "CKMB", "CKMBINDEX", "TROPONINI"  Lab Results  Component Value Date   CHOL 102 12/07/2021   CHOL 127 06/01/2021   Lab Results  Component Value Date   HDL 31 (L) 12/07/2021   HDL 47 06/01/2021   Lab Results  Component Value Date   LDLCALC 45 12/07/2021   LDLCALC 49 06/01/2021   Lab Results  Component Value Date   TRIG 101 01/04/2022   TRIG 128 12/07/2021   TRIG 154 (H) 06/01/2021   Lab Results  Component Value Date   CHOLHDL 3.3 12/07/2021   CHOLHDL 2.7 06/01/2021   No results found for: "LDLDIRECT"    Radiology: ECHOCARDIOGRAM LIMITED  Result Date: 01/04/2022    ECHOCARDIOGRAM LIMITED REPORT   Patient Name:   SHONTAVIA MICKEL [facility] Date of Exam: 01/04/2022 Medical Rec #:  [id number]     Height:       59.0 in Accession #:    [id number]    Weight:       127.4 lb Date of Birth:  [birth year]     BSA:          1.523 m Patient Age:    81 years      BP:           128/50 mmHg Patient Gender: F             HR:           54 bpm. Exam Location:  [facility] Procedure: Limited Echo, Color Doppler and Cardiac Doppler Indications:     Cardiac Areest I46.9  History:         Patient has prior history of Echocardiogram examinations, most                  recent 12/07/2021. Previous Myocardial Infarction,                  Signs/Symptoms:Chest Pain; Risk Factors:Diabetes and                  Hypertension.  Sonographer:     Cristela Blue Referring Phys:  [id number] Tonna Corner MICHELLE TANG Diagnosing Phys: Arnoldo Hooker MD  Sonographer Comments: Image acquisition challenging due to patient behavioral factors. IMPRESSIONS  1. The left ventricle  demonstrates regional wall motion abnormalities (see scoring diagram/findings for description).  2. Right ventricular systolic function is normal. The right ventricular size is normal.  3. Left atrial size was moderately dilated.  4. The mitral valve is normal in structure. Moderate mitral valve regurgitation.  5. Tricuspid valve regurgitation is mild to moderate.  6. The aortic valve is normal in structure. Aortic valve regurgitation is mild. FINDINGS  Left Ventricle: The left ventricle demonstrates regional wall motion abnormalities. Moderate hypokinesis of the left ventricular, mid-apical anterolateral wall. Right Ventricle: The right ventricular size is normal. No increase in right ventricular wall thickness. Right ventricular systolic function is normal. Left Atrium: Left atrial size was moderately dilated. Right Atrium: Right atrial size was normal in size. Pericardium: There is no evidence of pericardial effusion. Mitral Valve: The mitral valve is normal in structure. Moderate mitral valve regurgitation. Tricuspid Valve: The tricuspid valve is normal in structure. Tricuspid valve regurgitation is mild to moderate. Aortic Valve: The aortic valve is normal in structure. Aortic valve regurgitation is mild. Pulmonic Valve: The pulmonic valve  was normal in structure. Pulmonic valve regurgitation is trivial. Aorta: The aortic root and ascending aorta are structurally normal, with no evidence of dilitation. IAS/Shunts: No atrial level shunt detected by color flow Doppler. LEFT VENTRICLE PLAX 2D LVIDd:         4.40 cm LVIDs:         3.20 cm LV PW:         1.10 cm LV IVS:        1.00 cm LVOT diam:     2.00 cm LVOT Area:     3.14 cm  LEFT ATRIUM         Index LA diam:    3.40 cm 2.23 cm/m   AORTA Ao Root diam: 2.90 cm  SHUNTS Systemic Diam: 2.00 cm Serafina Royals MD Electronically signed by Serafina Royals MD Signature Date/Time: 01/04/2022/4:56:35 PM    Final    DG Chest 1 View  Result Date: 01/04/2022 CLINICAL  DATA:  81 year old female intubated.  Post CPR. EXAM: CHEST  1 VIEW COMPARISON:  Portable chest 01/03/2022 and earlier. FINDINGS: Portable AP semi upright view at 0611 hours. Endotracheal tube tip partially obscured by sternotomy hardware, but appears to be in good position between the level the clavicles and carina. Enteric tube courses into the abdomen, tip is not included. Stable pacer or resuscitation pads over the left chest. Stable lung volumes. Mediastinal contours remain normal. Allowing for portable technique the lungs are clear. No pneumothorax or pleural effusion identified. Negative visible bowel gas. Stable visualized osseous structures. IMPRESSION: 1. Satisfactory ET tube position. Enteric tube courses to the abdomen, tip not included. 2. No acute cardiopulmonary abnormality. Electronically Signed   By: Genevie Ann M.D.   On: 01/04/2022 07:26   DG Chest Portable 1 View  Result Date: 01/03/2022 CLINICAL DATA:  Intubation, post CPR EXAM: PORTABLE CHEST 1 VIEW COMPARISON:  01/03/2022 FINDINGS: Endotracheal tube is in the right mainstem bronchus. Recommend retracting approximately 5 cm. NG tube enters the stomach. Prior CABG. Heart is normal size. Increasing airspace disease throughout the left lung which may reflect atelectasis due to right mainstem intubation. Similar right upper lobe opacity/atelectasis. No effusions. IMPRESSION: Right mainstem intubation. Recommend retracting endotracheal tube approximately 5 cm. Areas of airspace disease in the left lung and right upper lobe may be related to atelectasis from right mainstem intubation. These results were called by telephone at the time of interpretation on 01/03/2022 at 10:21 pm to provider [facility] , who verbally acknowledged these results. Electronically Signed   By: Rolm Baptise M.D.   On: 01/03/2022 22:24   CT Cervical Spine Wo Contrast  Result Date: 01/03/2022 CLINICAL DATA:  Neck trauma (Age >= 65y) EXAM: CT CERVICAL SPINE WITHOUT CONTRAST  TECHNIQUE: Multidetector CT imaging of the cervical spine was performed without intravenous contrast. Multiplanar CT image reconstructions were also generated. RADIATION DOSE REDUCTION: This exam was performed according to the departmental dose-optimization program which includes automated exposure control, adjustment of the mA and/or kV according to patient size and/or use of iterative reconstruction technique. COMPARISON:  None Available. FINDINGS: Alignment: Normal Skull base and vertebrae: No acute fracture. No primary bone lesion or focal pathologic process. Soft tissues and spinal canal: No prevertebral fluid or swelling. No visible canal hematoma. 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 Mild bilateral degenerative facet disease. Upper chest: No acute findings Other: None IMPRESSION: Degenerative disc and facet disease.  No acute bony abnormality. Electronically Signed  By: Charlett Nose M.D.   On: 01/03/2022 20:10   CT HEAD WO CONTRAST ( )  Result Date: 01/03/2022 CLINICAL DATA:  Head trauma, minor (Age >= 65y) EXAM: CT HEAD WITHOUT CONTRAST TECHNIQUE: Contiguous axial images were obtained from the base of the skull through the vertex without intravenous contrast. RADIATION DOSE REDUCTION: This exam was performed according to the departmental dose-optimization program which includes automated exposure control, adjustment of the mA and/or kV according to patient size and/or use of iterative reconstruction technique. COMPARISON:  10/03/2021 FINDINGS: Brain: No acute intracranial abnormality. Specifically, no hemorrhage, hydrocephalus, mass lesion, acute infarction, or significant intracranial injury. Vascular: No hyperdense vessel or unexpected calcification. Skull: No acute calvarial abnormality. Sinuses/Orbits: No acute findings Other: None IMPRESSION: Normal study for patient's age. Electronically Signed   By: Charlett Nose M.D.   On:  01/03/2022 20:08   CT CHEST ABDOMEN PELVIS WO CONTRAST  Result Date: 01/03/2022 CLINICAL DATA:  Pneumonia, complication suspected, xray done. Shortness of breath. EXAM: CT CHEST, ABDOMEN AND PELVIS WITHOUT CONTRAST TECHNIQUE: Multidetector CT imaging of the chest, abdomen and pelvis was performed following the standard protocol without IV contrast. RADIATION DOSE REDUCTION: This exam was performed according to the departmental dose-optimization program which includes automated exposure control, adjustment of the mA and/or kV according to patient size and/or use of iterative reconstruction technique. COMPARISON:  None Available. FINDINGS: CT CHEST FINDINGS Cardiovascular: Heavily calcified aorta and coronary arteries. Prior CABG. Heart is normal size. Aorta is normal caliber. Mediastinum/Nodes: No mediastinal, hilar, or axillary adenopathy. Trachea and esophagus are unremarkable. Thyroid unremarkable. Lungs/Pleura: Lungs are clear. No focal airspace opacities or suspicious nodules. No effusions. Musculoskeletal: Chest wall soft tissues are unremarkable. No acute bony abnormality. CT ABDOMEN PELVIS FINDINGS Hepatobiliary: Small gallstone within the gallbladder. No focal hepatic abnormality. Pancreas: No focal abnormality or ductal dilatation. Spleen: No focal abnormality.  Normal size. Adrenals/Urinary Tract: Left kidney is atrophic. No renal or adrenal mass. No stones or hydronephrosis. Urinary bladder unremarkable. Stomach/Bowel: Left colonic diverticulosis. No active diverticulitis. Stomach and small bowel decompressed, unremarkable. Vascular/Lymphatic: Heavily calcified aorta. No evidence of aneurysm or adenopathy. Reproductive: Calcified fibroids in the uterus.  No adnexal masses. Other: No free fluid or free air. Musculoskeletal: No acute bony abnormality. Scoliosis and degenerative changes in the lumbar spine. IMPRESSION: No acute cardiopulmonary disease. Diffuse aortoiliac atherosclerosis.  No aneurysm.  Cholelithiasis. Left colonic diverticulosis.  No active diverticulitis. No acute findings in the abdomen or pelvis. Electronically Signed   By: Charlett Nose M.D.   On: 01/03/2022 20:05   DG Chest 2 View  Result Date: 01/03/2022 CLINICAL DATA:  Shortness of breath EXAM: CHEST - 2 VIEW COMPARISON:  12/06/2021 FINDINGS: Prior CABG. Heart and mediastinal contours are within normal limits. No focal opacities or effusions. No acute bony abnormality. IMPRESSION: No active cardiopulmonary disease. Electronically Signed   By: Charlett Nose M.D.   On: 01/03/2022 19:25     TELEMETRY reviewed by me: Currently sinus bradycardia with rate 40s.  ASSESSMENT AND PLAN:  Ebbony Adan is an 44yoF with a PMH of CAD s/p CABG x2 in 2018 (patent LIMA to LAD,  occluded SVG to OM1 by [facility] 05/2021), h/o PCI x1 distal RCA (50% ISR by [facility] 05/2021), ischemic CM with LVEF 55-60%, g1dd, mod MR 11/2021, CKD 3, hyperlipidemia, type 2 diabetes, CAD, GERD who presented to [facility] ED 01/03/2022 with shortness of breath.  Cardiology is consulted because she developed VF arrest while in the ED.   #VF arrest with successful  resuscitation s/p 1 round of CPR and defibrillation #NSTEMI with history of significant CAD s/p CABG x2 with known occluded SVG to OM1, LIMA to LAD patent by [facility] 05/2021 #Ischemic cardiomyopathy #Acute hypoxic respiratory failure -Agree with current therapy and appreciate ventilator management per primary team  -S/p 325 mg aspirin, 300 mg clopidogrel, continue DAPT with 81 mg aspirin and 75 mg clopidogrel daily indefinitely -Continue atorvastatin 80 mg daily -Restart metoprolol and isosorbide as blood pressure permits -Limited echo resulted with new wall motion abnormality as above --We will continue medical management with the above, hopefully the patient will be able to wean from ventilator and and participate in further discussions, she has historically favored medical management due to significant risks and goals of  care  -Reinstatement metoprolol at low-dose for better heart rate control and risk reduction cardiovascular event -Begin ambulation and cardiac rehab as able -No further cardiac diagnostics necessary at this time Signed: Corey Skains ,   01/06/2022, 12:55 PM [facility] Cardiology

## 2022-01-06 NOTE — Hospital Course (Signed)
Patient is an 81 year old female with past medical history of CAD status post CABG x2 in 2018 with history of PCI as well as ischemic cardiomyopathy, diabetes mellitus and stage III chronic kidney disease who presented to the emergency room on 7/12 with shortness of breath.  Following admission, patient started complaining of right-sided chest pain and then went into V-fib arrest and was pulseless.  CPR was initiated and patient was resuscitated, intubated for airway protection.  Postcardiac arrest EKG noted persistent diffuse ST depression and cardiology consulted.  Patient able to be extubated by 7/14.  Echocardiogram noted new moderate hypokinesis of the left ventricle mid apical and anterolateral wall.  Patient did require pressor support on morning of 7/14 to maintain MAP, but this was able to be weaned off successfully by 7/14 evening.  Today, patient is awake and alert.  Interactive and appropriate.  Comfortable.  Renal function improving and troponin continues to trend downward.

## 2022-01-06 NOTE — Assessment & Plan Note (Signed)
Well-controlled.  A1c a month ago at 6.8.  Sliding scale.

## 2022-01-06 NOTE — Assessment & Plan Note (Signed)
Weaned off of ventilator and high flow oxygen.  Secondary to non-STEMI.  Weaned off oxygen altogether by 7/16

## 2022-01-06 NOTE — Assessment & Plan Note (Signed)
Antihypertensives held.  Initially required pressor support on 7/14, possibly from cardiogenic shock which has since resolved

## 2022-01-06 NOTE — Evaluation (Signed)
Occupational Therapy Evaluation Lindsey Pollard Details Name: Lindsey Pollard MRN: [id number] DOB: 20-Sep-1940 Today's Date: 01/06/2022   History of Present Illness Lindsey Pollard is an 81 y/o F admitted on 01/03/22 after presenting to the ED with c/o SOB & a fall 3 days ago, hitting the side of her head without LOC. Lindsey Pollard was placed on 2L via [place] but later c/o R sided chest pain & shortly after went into v-fib arrest without a pulse. CPR/ACLS initiated, Lindsey Pollard was intubated for airway protection. Lindsey Pollard was extubated on 01/05/22. PMH: CAD s/p CABG, HTN, HLD, DM, PAD, CHF, CKD-3A, RLS   Clinical Impression   Chart reviewed, RN cleared Lindsey Pollard for participation in OT evaluation. Co tx completed with Lindsey Pollard on this date. Lindsey Pollard is alert and oriented x4 however presents with increased time required for processing and fair-poor safety awareness. PTA Lindsey Pollard reports she was MOD I-I in ADL/IADL. She reports she lives with her friend who recently had a stroke. At this time Lindsey Pollard presents with deficits in strength, endurance, activity tolerance all affecting safe and optimal ADL completion. Recommend STR to address functional deficits and to facilitate return to PLOF.      Recommendations for follow up therapy are one component of a multi-disciplinary discharge planning process, led by the attending physician.  Recommendations may be updated based on Lindsey Pollard status, additional functional criteria and insurance authorization.   Follow Up Recommendations  Skilled nursing-short term rehab (<3 hours/day)    Assistance Recommended at Discharge Frequent or constant Supervision/Assistance  Lindsey Pollard can return home with the following A lot of help with walking and/or transfers;A little help with walking and/or transfers    Functional Status Assessment  Lindsey Pollard has had a recent decline in their functional status and demonstrates the ability to make significant improvements in function in a reasonable and predictable amount of time.  Equipment Recommendations  Other  (comment) (per next venue of care)    Recommendations for Other Services       Precautions / Restrictions Precautions Precautions: Fall Restrictions Weight Bearing Restrictions: No      Mobility Bed Mobility Overal bed mobility: Needs Assistance Bed Mobility: Supine to Sit     Supine to sit: Min assist, HOB elevated          Transfers Overall transfer level: Needs assistance Equipment used: 1 person hand held assist, 2 person hand held assist Transfers: Sit to/from Stand Sit to Stand: Min guard, Min assist                  Balance Overall balance assessment: Needs assistance Sitting-balance support: Feet supported, Bilateral upper extremity supported Sitting balance-Leahy Scale: Fair     Standing balance support: During functional activity, Bilateral upper extremity supported Standing balance-Leahy Scale: Poor                             ADL either performed or assessed with clinical judgement   ADL Overall ADL's : Needs assistance/impaired     Grooming: Wash/dry face;Sitting;Set up               Lower Body Dressing: Maximal assistance Lower Body Dressing Details (indicate cue type and reason): socks bed level Toilet Transfer: Minimal assistance;+2 for safety/equipment Toilet Transfer Details (indicate cue type and reason): short amb transfer to bedside chair         Functional mobility during ADLs: Minimal assistance       Vision Lindsey Pollard Visual Report: No change from  baseline       Perception     Praxis      Pertinent Vitals/Pain Pain Assessment Pain Assessment: Faces Faces Pain Scale: No hurt     Hand Dominance     Extremity/Trunk Assessment Upper Extremity Assessment Upper Extremity Assessment: Generalized weakness   Lower Extremity Assessment Lower Extremity Assessment: Generalized weakness       Communication Communication Communication: HOH   Cognition Arousal/Alertness: Awake/alert Behavior During  Therapy: Flat affect Overall Cognitive Status: No family/caregiver present to determine baseline cognitive functioning Area of Impairment: Attention, Problem solving                   Current Attention Level: Sustained         Problem Solving: Slow processing       General Comments  Lindsey Pollard received on 2L/min but weaned & left on room air with SpO2 >/= 90% Blood pressure as follows:  Sitting EOB: 146/78 mmHg (MAP 96)  Standing: 121/49 mmHg (MAP 70)  Sitting in recliner: 136/58 mmHg (MAP 81)  Max HR of 130 bpm in standing    Exercises Other Exercises Other Exercises: edu re: role of OT, role of rehab, discharge recommendations, falls prevention   Shoulder Instructions      Home Living Family/Lindsey Pollard expects to be discharged to:: Private residence Living Arrangements: Non-relatives/Friends Available Help at Discharge: Friend(s);Available PRN/intermittently Type of Home:  (condo) Home Access: Level entry     Home Layout: One level         Bathroom Toilet: Standard     Home Equipment: Cane - single point   Additional Comments: Lives with friend who recently had a stroke. Uses uber/lyft to go get to/from grocery store.      Prior Functioning/Environment Prior Level of Function : Independent/Modified Independent             Mobility Comments: no AD for home amb, SPC for community mobility ADLs Comments: MOD I-I in ADL/IADL, however does not drive, uses lyft to get to the grocery story        OT Problem List: Decreased strength;Decreased activity tolerance;Decreased cognition;Impaired balance (sitting and/or standing)      OT Treatment/Interventions: Self-care/ADL training;Lindsey Pollard/family education;Therapeutic exercise;Balance training;Energy conservation;Therapeutic activities;DME and/or AE instruction    OT Goals(Current goals can be found in the care plan section) Acute Rehab OT Goals Lindsey Pollard Stated Goal: get stronger OT Goal Formulation: With  Lindsey Pollard Time For Goal Achievement: 01/20/22 Potential to Achieve Goals: Good ADL Goals Lindsey Pollard Will Perform Grooming: with modified independence;sitting;standing Lindsey Pollard Will Perform Lower Body Dressing: with modified independence Lindsey Pollard Will Transfer to Toilet: with modified independence;ambulating Lindsey Pollard Will Perform Toileting - Clothing Manipulation and hygiene: with modified independence;sit to/from stand  OT Frequency: Min 2X/week    Co-evaluation Lindsey Pollard/OT/SLP Co-Evaluation/Treatment: Yes Reason for Co-Treatment: Complexity of the Lindsey Pollard's impairments (multi-system involvement);For Lindsey Pollard/therapist safety Lindsey Pollard goals addressed during session: Balance;Mobility/safety with mobility OT goals addressed during session: ADL's and self-care      AM-PAC OT "6 Clicks" Daily Activity     Outcome Measure Help from another person eating meals?: None Help from another person taking care of personal grooming?: None Help from another person toileting, which includes using toliet, bedpan, or urinal?: A Lot Help from another person bathing (including washing, rinsing, drying)?: A Lot Help from another person to put on and taking off regular upper body clothing?: A Little Help from another person to put on and taking off regular lower body clothing?: A Lot 6 Click Score: 17  End of Session Equipment Utilized During Treatment: Oxygen Nurse Communication: Mobility status  Activity Tolerance: Lindsey Pollard tolerated treatment well Lindsey Pollard left: in chair;with call bell/phone within reach  OT Visit Diagnosis: Unsteadiness on feet (R26.81);Muscle weakness (generalized) (M62.81)                Time: 7588-3254 OT Time Calculation (min): 15 min Charges:  OT General Charges $OT Visit: 1 Visit OT Evaluation $OT Eval Low Complexity: 1 Low  Oleta Mouse, OTD OTR/L  01/06/22, 2:31 PM

## 2022-01-07 DIAGNOSIS — N179 Acute kidney failure, unspecified: Secondary | ICD-10-CM | POA: Diagnosis not present

## 2022-01-07 DIAGNOSIS — J9601 Acute respiratory failure with hypoxia: Secondary | ICD-10-CM | POA: Diagnosis not present

## 2022-01-07 DIAGNOSIS — I469 Cardiac arrest, cause unspecified: Secondary | ICD-10-CM | POA: Diagnosis not present

## 2022-01-07 DIAGNOSIS — I214 Non-ST elevation (NSTEMI) myocardial infarction: Secondary | ICD-10-CM | POA: Diagnosis not present

## 2022-01-07 LAB — GLUCOSE, CAPILLARY
Glucose-Capillary: 74 mg/dL (ref 70–99)
Glucose-Capillary: 115 mg/dL — ABNORMAL HIGH (ref 70–99)
Glucose-Capillary: 199 mg/dL — ABNORMAL HIGH (ref 70–99)
Glucose-Capillary: 111 mg/dL — ABNORMAL HIGH (ref 70–99)

## 2022-01-07 LAB — PHOSPHORUS: Phosphorus: 3.8 mg/dL (ref 2.5–4.6)

## 2022-01-07 LAB — BASIC METABOLIC PANEL
Anion gap: 7 (ref 5–15)
BUN: 39 mg/dL — ABNORMAL HIGH (ref 8–23)
CO2: 25 mmol/L (ref 22–32)
Calcium: 8.2 mg/dL — ABNORMAL LOW (ref 8.9–10.3)
Chloride: 100 mmol/L (ref 98–111)
Creatinine, Ser: 1.33 mg/dL — ABNORMAL HIGH (ref 0.44–1.00)
GFR, Estimated: 40 mL/min — ABNORMAL LOW (ref >60)
Glucose, Bld: 85 mg/dL (ref 70–99)
Potassium: 4.1 mmol/L (ref 3.5–5.1)
Sodium: 132 mmol/L — ABNORMAL LOW (ref 135–145)

## 2022-01-07 LAB — CBC
HCT: 26.1 % — ABNORMAL LOW (ref 36.0–46.0)
Hemoglobin: 8.5 g/dL — ABNORMAL LOW (ref 12.0–15.0)
MCH: 31.6 pg (ref 26.0–34.0)
MCHC: 32.6 g/dL (ref 30.0–36.0)
MCV: 97 fL (ref 80.0–100.0)
Platelets: 156 10*3/uL (ref 150–400)
RBC: 2.69 MIL/uL — ABNORMAL LOW (ref 3.87–5.11)
RDW: 14.3 % (ref 11.5–15.5)
WBC: 10.2 10*3/uL (ref 4.0–10.5)
nRBC: 0 % (ref 0.0–0.2)

## 2022-01-07 LAB — MAGNESIUM: Magnesium: 2.1 mg/dL (ref 1.7–2.4)

## 2022-01-07 NOTE — Progress Notes (Signed)
[facility] Progress Note  Patient: Lindsey Pollard    JSE:[id number]  DOA: 01/03/2022    Date of Service: the patient was seen and examined on 01/07/2022  Brief hospital course: Patient is an 81 year old female with past medical history of CAD status post CABG x2 in 2018 with history of PCI as well as ischemic cardiomyopathy, diabetes mellitus and stage III chronic kidney disease who presented to the emergency room on 7/12 with shortness of breath.  Following admission, patient started complaining of right-sided chest pain and then went into V-fib arrest and was pulseless.  CPR was initiated and patient was resuscitated, intubated for airway protection.  Postcardiac arrest EKG noted persistent diffuse ST depression and cardiology consulted.  Patient able to be extubated by 7/14.  Echocardiogram noted new moderate hypokinesis of the left ventricle mid apical and anterolateral wall.  Patient did require pressor support on morning of 7/14 to maintain MAP, but this was able to be weaned off successfully by 7/14 evening.  By 7/15, patient more awake and alert and appropriate.  Renal function slowly improving and troponin trending downward.  Seen by cardiology who are recommending cardiac rehab and outpatient follow-up.  Assessment and Plan: Assessment and Plan: * NSTEMI (non-ST elevated myocardial infarction) (HCC) Echocardiogram notes new hypokinesis as compared to echocardiogram from a month ago.  Cardiology following.  Recommendation for outpatient follow-up.  Continue to monitor renal function.  Cardiac rehab.  Cardiac arrest with successful resuscitation [facility]) Resuscitated.  Currently off of ventilator and pressor support  Acute respiratory failure with hypoxia (HCC) Weaned off of ventilator and high flow oxygen.  Secondary to non-STEMI.  Down to 1 L nasal cannula.  AKI (acute kidney injury) (HCC) in the setting of stage IIIa chronic Improving although slight increase in creatinine today to  1.33 with GFR 40.  Type II diabetes mellitus with renal manifestations (HCC) Well-controlled.  A1c a month ago at 6.8.  Sliding scale.  Chronic diastolic CHF (congestive heart failure) (HCC) Echocardiogram a month ago noted grade 1 diastolic dysfunction.  Monitor for decompensation  HTN (hypertension) Antihypertensives held.  Initially required pressor support on 7/14, possibly from cardiogenic shock which has since resolved  HLD (hyperlipidemia) On statin  Overweight (BMI 25.0-29.9) Meets criteria BMI greater than 25       Body mass index is 26.67 kg/m.  Nutrition Problem: Moderate Malnutrition Etiology: chronic illness     Consultants: Cardiology Critical care  Procedures: Echocardiogram Intubation  Antimicrobials: None  Code Status: Full code   Subjective: Feels okay, tired.  Objective: Minimal tachycardia Vitals:   01/07/22 0352 01/07/22 0827  BP: (!) 109/55 (!) 122/49  Pulse: 84 93  Resp: 15 20  Temp: 98.4 F (36.9 C) 98.8 F (37.1 C)  SpO2: 100% 98%    Intake/Output Summary (Last 24 hours) at 01/07/2022 1651 Last data filed at 01/07/2022 1130 Gross per 24 hour  Intake 260 ml  Output 700 ml  Net -440 ml    Filed Weights   01/04/22 0300 01/05/22 0350 01/06/22 0438  Weight: 57.8 kg 56.7 kg 59.9 kg   Body mass index is 26.67 kg/m.  Exam:  General: Alert and oriented x2, no acute distress HEENT: Normocephalic, atraumatic, mucous membranes are moist Cardiovascular: Regular rate and rhythm, S1-S2, 2/6 systolic ejection murmur Respiratory: Clear to auscultation bilaterally Abdomen: Soft, nontender, nondistended, positive bowel sounds Musculoskeletal: No clubbing or cyanosis, trace pitting edema Skin: No skin breaks, tears or lesions Psychiatry: Appropriate, no evidence of psychoses Neurology: No focal  deficits  Data Reviewed: Creatinine slightly increased to 1.33 PT and OT recommending skilled nursing  Disposition:  Status is:  Inpatient Remains inpatient appropriate because: Further weaning from oxygen, initiation of cardiac rehab    Anticipated discharge date: When skilled nursing bed available, possibly as early as 7/17  Family Communication: Left message with daughters DVT Prophylaxis: enoxaparin (LOVENOX) injection 30 mg Start: 01/06/22 2200    Author: Hollice Espy ,MD 01/07/2022 4:51 PM  To reach On-call, see care teams to locate the attending and reach out via [email or web address]. Between 7PM-7AM, please contact night-coverage If you still have difficulty reaching the attending provider, please page the [facility] (Director on Call) for [facility] on amion for assistance.

## 2022-01-07 NOTE — Progress Notes (Signed)
[facility] CLINIC CARDIOLOGY CONSULT NOTE       Patient ID: Lindsey Pollard MRN: [id number] DOB/AGE: [birth year] 81 y.o.  Admit date: 01/03/2022 Referring Physician Blair Hailey, NP  Primary Physician Dr. Einar Crow Primary Cardiologist Dr. 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  Cardiology is consulted because she developed VF arrest while in the ED.    Interval History:  - echo with new moderate hypokinesis of LV mid apical, anterolateral wall - troponin peaked   at 6100 -The patient has been transferred to telemetry with continued normal sinus rhythm and slight improvements of heart rate control with addition of metoprolol.  There is been no evidence of atrial fibrillation or sinus tachycardia.  The patient otherwise is not able to tolerate guideline medical therapy yet other than metoprolol use.  We will continue use of dual antiplatelet therapy and continuation of treatments for pulmonary concerns      Past Medical History:  Diagnosis Date   Chest pain 12/06/2021   Diabetes mellitus without complication (HCC)    Hyperlipidemia    Hypertension    Myocardial infarction [facility])     Past Surgical History:  Procedure Laterality Date   CARDIAC SURGERY     bypass   CORONARY/GRAFT ACUTE MI REVASCULARIZATION N/A 06/01/2021   Procedure: Coronary/Graft Acute MI Revascularization;  Surgeon: Alwyn Pea, MD;  Location: [facility] INVASIVE CV LAB;  Service: Cardiovascular;  Laterality: N/A;   EYE SURGERY     cataract extraction   LEFT HEART CATH AND CORONARY ANGIOGRAPHY N/A 06/01/2021   Procedure: LEFT HEART CATH AND CORONARY ANGIOGRAPHY;   Surgeon: Alwyn Pea, MD;  Location: [facility] INVASIVE CV LAB;  Service: Cardiovascular;  Laterality: N/A;    Medications Prior to Admission  Medication Sig Dispense Refill Last Dose   acetaminophen (TYLENOL) 325 MG tablet Take 650 mg by mouth at bedtime.   Past Week   albuterol (VENTOLIN HFA) 108 (90 Base) MCG/ACT inhaler Inhale 1-2 puffs into the lungs every 4 (four) hours as needed for shortness of breath.   Past Week   aspirin 81 MG EC tablet Take by mouth.   Past Week   atorvastatin (LIPITOR) 80 MG tablet Take 80 mg by mouth every evening.   Past Week   baclofen (LIORESAL) 10 MG tablet Take 10 mg by mouth 3 (three) times daily.   Past Week   Cholecalciferol 25 MCG (1000 UT) capsule Take 1,000 Units by mouth 2 (two) times daily.   Past Week   clopidogrel (PLAVIX) 75 MG tablet Take 1 tablet (75 mg total) by mouth daily. 30 tablet 0 Past Week   insulin glargine (LANTUS) 100 UNIT/ML injection Inject 8 Units into the skin at bedtime.   Past Week   insulin lispro (HUMALOG) 100 UNIT/ML KwikPen Inject 0-2 Units into the skin 3 (three) times daily.   Past Week   ipratropium-albuterol (DUONEB) 0.5-2.5 (3) MG/3ML SOLN Take 3 mLs by nebulization every 6 (six) hours as needed (shortness of breath).   Past Week   isosorbide mononitrate (IMDUR) 30 MG 24 hr tablet Take 1 tablet (30 mg total) by mouth  daily. 30 tablet 1 Past Week   lisinopril (ZESTRIL) 20 MG tablet Take 1 tablet (20 mg total) by mouth daily. 30 tablet 1 Past Week   metoprolol succinate (TOPROL-XL) 50 MG 24 hr tablet Take 50 mg by mouth daily.   Past Week   Multiple Vitamin (MULTIVITAMIN WITH MINERALS) TABS tablet Take 1 tablet by mouth daily.   Past Week   ondansetron (ZOFRAN-ODT) 4 MG disintegrating tablet Take 4 mg by mouth every 8 (eight) hours as needed.   Past Week   senna (SENOKOT) 8.6 MG tablet Take 2 tablets by mouth at bedtime.   Past Week   SPIRIVA HANDIHALER 18 MCG inhalation capsule Place 1 capsule into inhaler and inhale  daily as needed for shortness of breath.   Past Week   spironolactone (ALDACTONE) 25 MG tablet Take 12.5 mg by mouth daily.   Past Week   torsemide (DEMADEX) 20 MG tablet Take 20 mg by mouth daily.   Past Week   nitroGLYCERIN (NITROSTAT) 0.4 MG SL tablet Place 0.4 mg under the tongue every 5 (five) minutes as needed for chest pain.    at prn   nystatin (MYCOSTATIN/NYSTOP) powder Apply three times a day under skin folds in groin (Patient not taking: Reported on 01/04/2022) 60 g 0 Not Taking   Social History   Socioeconomic History   Marital status: Divorced    Spouse name: Not on file   Number of children: Not on file   Years of education: Not on file   Highest education level: Not on file  Occupational History   Not on file  Tobacco Use   Smoking status: Former    Types: Cigarettes   Smokeless tobacco: Never  Substance and Sexual Activity   Alcohol use: Not Currently   Drug use: Not Currently   Sexual activity: Not on file  Other Topics Concern   Not on file  Social History Narrative   Not on file   Social Determinants of Health   Financial Resource Strain: Not on file  Food Insecurity: Not on file  Transportation Needs: Not on file  Physical Activity: Not on file  Stress: Not on file  Social Connections: Not on file  Intimate Partner Violence: Not on file    Family History  Problem Relation Age of Onset   Congestive Heart Failure Mother    Diabetes Father    Breast cancer Neg Hx       PHYSICAL EXAM General: Elderly and critically ill appearing Caucasian female, intubated and minimally sedated in the ICU.   HEENT:  Normocephalic and atraumatic. Neck:  No JVD.  Lungs: Mechanically ventilated coarse breath sounds anteriorly. Heart: Bradycardic but regular, S1-S2 present with 2/6 systolic murmur abdomen: Non-distended appearing.  Msk: Normal strength and tone for age. Extremities: Warm and well perfused. No clubbing, cyanosis.  No peripheral edema.  Safety mitts on  bilateral hands Neuro: Intubated and sedated Psych: Intubated and sedated  Labs:   Lab Results  Component Value Date   WBC 10.2 01/07/2022   HGB 8.5 (L) 01/07/2022   HCT 26.1 (L) 01/07/2022   MCV 97.0 01/07/2022   PLT 156 01/07/2022    Recent Labs  Lab 01/03/22 2319 01/04/22 0514 01/07/22 0438  NA 140   < > 132*  K 3.1*   < > 4.1  CL 101   < > 100  CO2 26   < > 25  BUN 35*   < > 39*  CREATININE 2.06*   < >  1.33*  CALCIUM 8.5*   < > 8.2*  PROT 5.4*  --   --   BILITOT 1.2  --   --   ALKPHOS 111  --   --   ALT 146*  --   --   AST 245*  --   --   GLUCOSE 179*   < > 85   < > = values in this interval not displayed.    No results found for: "CKTOTAL", "CKMB", "CKMBINDEX", "TROPONINI"  Lab Results  Component Value Date   CHOL 102 12/07/2021   CHOL 127 06/01/2021   Lab Results  Component Value Date   HDL 31 (L) 12/07/2021   HDL 47 06/01/2021   Lab Results  Component Value Date   LDLCALC 45 12/07/2021   LDLCALC 49 06/01/2021   Lab Results  Component Value Date   TRIG 101 01/04/2022   TRIG 128 12/07/2021   TRIG 154 (H) 06/01/2021   Lab Results  Component Value Date   CHOLHDL 3.3 12/07/2021   CHOLHDL 2.7 06/01/2021   No results found for: "LDLDIRECT"    Radiology: ECHOCARDIOGRAM LIMITED  Result Date: 01/04/2022    ECHOCARDIOGRAM LIMITED REPORT   Patient Name:   Lindsey Pollard [facility] Date of Exam: 01/04/2022 Medical Rec #:  [id number]     Height:       59.0 in Accession #:    [id number]    Weight:       127.4 lb Date of Birth:  05/11/1941     BSA:          1.523 m Patient Age:    51 years      BP:           128/50 mmHg Patient Gender: F             HR:           54 bpm. Exam Location:  [facility] Procedure: Limited Echo, Color Doppler and Cardiac Doppler Indications:     Cardiac Areest I46.9  History:         Patient has prior history of Echocardiogram examinations, most                  recent 12/07/2021. Previous Myocardial Infarction,                  Signs/Symptoms:Chest  Pain; Risk Factors:Diabetes and                  Hypertension.  Sonographer:     Sherrie Sport Referring Phys:  [id number] [place] TANG Diagnosing Phys: Serafina Royals MD  Sonographer Comments: Image acquisition challenging due to patient behavioral factors. IMPRESSIONS  1. The left ventricle demonstrates regional wall motion abnormalities (see scoring diagram/findings for description).  2. Right ventricular systolic function is normal. The right ventricular size is normal.  3. Left atrial size was moderately dilated.  4. The mitral valve is normal in structure. Moderate mitral valve regurgitation.  5. Tricuspid valve regurgitation is mild to moderate.  6. The aortic valve is normal in structure. Aortic valve regurgitation is mild. FINDINGS  Left Ventricle: The left ventricle demonstrates regional wall motion abnormalities. Moderate hypokinesis of the left ventricular, mid-apical anterolateral wall. Right Ventricle: The right ventricular size is normal. No increase in right ventricular wall thickness. Right ventricular systolic function is normal. Left Atrium: Left atrial size was moderately dilated. Right Atrium: Right atrial size was normal in size. Pericardium: There is no evidence of pericardial effusion. Mitral  Valve: The mitral valve is normal in structure. Moderate mitral valve regurgitation. Tricuspid Valve: The tricuspid valve is normal in structure. Tricuspid valve regurgitation is mild to moderate. Aortic Valve: The aortic valve is normal in structure. Aortic valve regurgitation is mild. Pulmonic Valve: The pulmonic valve was normal in structure. Pulmonic valve regurgitation is trivial. Aorta: The aortic root and ascending aorta are structurally normal, with no evidence of dilitation. IAS/Shunts: No atrial level shunt detected by color flow Doppler. LEFT VENTRICLE PLAX 2D LVIDd:         4.40 cm LVIDs:         3.20 cm LV PW:         1.10 cm LV IVS:        1.00 cm LVOT diam:     2.00 cm LVOT Area:     3.14  cm  LEFT ATRIUM         Index LA diam:    3.40 cm 2.23 cm/m   AORTA Ao Root diam: 2.90 cm  SHUNTS Systemic Diam: 2.00 cm Serafina Royals MD Electronically signed by Serafina Royals MD Signature Date/Time: 01/04/2022/4:56:35 PM    Final    DG Chest 1 View  Result Date: 01/04/2022 CLINICAL DATA:  81 year old female intubated.  Post CPR. EXAM: CHEST  1 VIEW COMPARISON:  Portable chest 01/03/2022 and earlier. FINDINGS: Portable AP semi upright view at 0611 hours. Endotracheal tube tip partially obscured by sternotomy hardware, but appears to be in good position between the level the clavicles and carina. Enteric tube courses into the abdomen, tip is not included. Stable pacer or resuscitation pads over the left chest. Stable lung volumes. Mediastinal contours remain normal. Allowing for portable technique the lungs are clear. No pneumothorax or pleural effusion identified. Negative visible bowel gas. Stable visualized osseous structures. IMPRESSION: 1. Satisfactory ET tube position. Enteric tube courses to the abdomen, tip not included. 2. No acute cardiopulmonary abnormality. Electronically Signed   By: Genevie Ann M.D.   On: 01/04/2022 07:26   DG Chest Portable 1 View  Result Date: 01/03/2022 CLINICAL DATA:  Intubation, post CPR EXAM: PORTABLE CHEST 1 VIEW COMPARISON:  01/03/2022 FINDINGS: Endotracheal tube is in the right mainstem bronchus. Recommend retracting approximately 5 cm. NG tube enters the stomach. Prior CABG. Heart is normal size. Increasing airspace disease throughout the left lung which may reflect atelectasis due to right mainstem intubation. Similar right upper lobe opacity/atelectasis. No effusions. IMPRESSION: Right mainstem intubation. Recommend retracting endotracheal tube approximately 5 cm. Areas of airspace disease in the left lung and right upper lobe may be related to atelectasis from right mainstem intubation. These results were called by telephone at the time of interpretation on  01/03/2022 at 10:21 pm to provider [facility] , who verbally acknowledged these results. Electronically Signed   By: Rolm Baptise M.D.   On: 01/03/2022 22:24   CT Cervical Spine Wo Contrast  Result Date: 01/03/2022 CLINICAL DATA:  Neck trauma (Age >= 65y) EXAM: CT CERVICAL SPINE WITHOUT CONTRAST TECHNIQUE: Multidetector CT imaging of the cervical spine was performed without intravenous contrast. Multiplanar CT image reconstructions were also generated. RADIATION DOSE REDUCTION: This exam was performed according to the departmental dose-optimization program which includes automated exposure control, adjustment of the mA and/or kV according to patient size and/or use of iterative reconstruction technique. COMPARISON:  None Available. FINDINGS: Alignment: Normal Skull base and vertebrae: No acute fracture. No primary bone lesion or focal pathologic process. Soft tissues and spinal canal: No prevertebral fluid or  swelling. No visible canal hematoma. 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 Mild bilateral degenerative facet disease. Upper chest: No acute findings Other: None IMPRESSION: Degenerative disc and facet disease.  No acute bony abnormality. Electronically Signed   By: Rolm Baptise M.D.   On: 01/03/2022 20:10   CT HEAD WO CONTRAST (5MM)  Result Date: 01/03/2022 CLINICAL DATA:  Head trauma, minor (Age >= 65y) EXAM: CT HEAD WITHOUT CONTRAST TECHNIQUE: Contiguous axial images were obtained from the base of the skull through the vertex without intravenous contrast. RADIATION DOSE REDUCTION: This exam was performed according to the departmental dose-optimization program which includes automated exposure control, adjustment of the mA and/or kV according to patient size and/or use of iterative reconstruction technique. COMPARISON:  10/03/2021 FINDINGS: Brain: No acute intracranial abnormality. Specifically, no hemorrhage, hydrocephalus, mass lesion,  acute infarction, or significant intracranial injury. Vascular: No hyperdense vessel or unexpected calcification. Skull: No acute calvarial abnormality. Sinuses/Orbits: No acute findings Other: None IMPRESSION: Normal study for patient's age. Electronically Signed   By: Rolm Baptise M.D.   On: 01/03/2022 20:08   CT CHEST ABDOMEN PELVIS WO CONTRAST  Result Date: 01/03/2022 CLINICAL DATA:  Pneumonia, complication suspected, xray done. Shortness of breath. EXAM: CT CHEST, ABDOMEN AND PELVIS WITHOUT CONTRAST TECHNIQUE: Multidetector CT imaging of the chest, abdomen and pelvis was performed following the standard protocol without IV contrast. RADIATION DOSE REDUCTION: This exam was performed according to the departmental dose-optimization program which includes automated exposure control, adjustment of the mA and/or kV according to patient size and/or use of iterative reconstruction technique. COMPARISON:  None Available. FINDINGS: CT CHEST FINDINGS Cardiovascular: Heavily calcified aorta and coronary arteries. Prior CABG. Heart is normal size. Aorta is normal caliber. Mediastinum/Nodes: No mediastinal, hilar, or axillary adenopathy. Trachea and esophagus are unremarkable. Thyroid unremarkable. Lungs/Pleura: Lungs are clear. No focal airspace opacities or suspicious nodules. No effusions. Musculoskeletal: Chest wall soft tissues are unremarkable. No acute bony abnormality. CT ABDOMEN PELVIS FINDINGS Hepatobiliary: Small gallstone within the gallbladder. No focal hepatic abnormality. Pancreas: No focal abnormality or ductal dilatation. Spleen: No focal abnormality.  Normal size. Adrenals/Urinary Tract: Left kidney is atrophic. No renal or adrenal mass. No stones or hydronephrosis. Urinary bladder unremarkable. Stomach/Bowel: Left colonic diverticulosis. No active diverticulitis. Stomach and small bowel decompressed, unremarkable. Vascular/Lymphatic: Heavily calcified aorta. No evidence of aneurysm or adenopathy.  Reproductive: Calcified fibroids in the uterus.  No adnexal masses. Other: No free fluid or free air. Musculoskeletal: No acute bony abnormality. Scoliosis and degenerative changes in the lumbar spine. IMPRESSION: No acute cardiopulmonary disease. Diffuse aortoiliac atherosclerosis.  No aneurysm. Cholelithiasis. Left colonic diverticulosis.  No active diverticulitis. No acute findings in the abdomen or pelvis. Electronically Signed   By: Rolm Baptise M.D.   On: 01/03/2022 20:05   DG Chest 2 View  Result Date: 01/03/2022 CLINICAL DATA:  Shortness of breath EXAM: CHEST - 2 VIEW COMPARISON:  12/06/2021 FINDINGS: Prior CABG. Heart and mediastinal contours are within normal limits. No focal opacities or effusions. No acute bony abnormality. IMPRESSION: No active cardiopulmonary disease. Electronically Signed   By: Rolm Baptise M.D.   On: 01/03/2022 19:25     TELEMETRY reviewed by me: Currently sinus bradycardia with rate 40s.  ASSESSMENT AND PLAN:  Lindsey Pollard is an 44yoF with a PMH of CAD s/p CABG x2 in 2018 (patent LIMA to LAD,  occluded SVG to OM1 by [facility] 05/2021), h/o PCI x1 distal RCA (50% ISR by [facility]  05/2021), ischemic CM with LVEF 55-60%, g1dd, mod MR 11/2021, CKD 3, hyperlipidemia, type 2 diabetes, CAD, GERD who presented to [facility] ED 01/03/2022 with shortness of breath.  Cardiology is consulted because she developed VF arrest while in the ED.   #VF arrest with successful resuscitation s/p 1 round of CPR and defibrillation #NSTEMI with history of significant CAD s/p CABG x2 with known occluded SVG to OM1, LIMA to LAD patent by [facility] 05/2021 #Ischemic cardiomyopathy #Acute hypoxic respiratory failure  -Continue dual antiplatelet therapy at this time for non-ST elevation myocardial infarction -Continue atorvastatin 80 mg daily -Restarted metoprolol and will consider isosorbide as blood pressure permits and/or if patient has symptoms with physical activity -Limited echo resulted with new wall motion  abnormality as above -- Continue supportive care for pulmonary issues -Begin ambulation and cardiac rehab as able -No further cardiac diagnostics necessary at this time -Possible discharge to home in the next day or 2 if patient continues to ambulate well with no further significant symptoms Signed: Lamar Blinks ,   01/07/2022, 4:13 PM [facility]

## 2022-01-07 NOTE — Assessment & Plan Note (Signed)
On statin.

## 2022-01-07 NOTE — Consult Note (Signed)
PHARMACY CONSULT NOTE  Pharmacy Consult for Electrolyte Monitoring and Replacement   Recent Labs: Potassium (mmol/L)  Date Value  01/07/2022 4.1   Magnesium (mg/dL)  Date Value  02/72/5366 2.1   Calcium (mg/dL)  Date Value  44/08/4740 8.2 (L)   Albumin (g/dL)  Date Value  59/56/3875 3.4 (L)   Phosphorus (mg/dL)  Date Value  64/33/2951 3.8   Sodium (mmol/L)  Date Value  01/07/2022 132 (L)   Assessment: 81yo Female w/ h/o CAD (s/p CABG x2 2018), HTN, HLD, DM, PAD, CHF (EF 45-50%>55-60%), CKD-3a, and RLS, who presents to the ED with SOB worse w/ exertion w/ no associated symptoms. In the ED pt went into Vfib arrest w/ ROSC after 1 round of CPR/Defib. Patient reported a fall 3 days ago hitting the side of her head without LOC. Pharmacy consulted for mgmt of electrolytes.  Goal of Therapy:  K>4 & Mg >2 All other electrolytes within normal limits  Plan:  --Patient care transferred from PCCM to [facility]. Will discontinue electrolyte consult at this time. Defer further ordering of labs and electrolyte replacement to primary team  Tressie Ellis 01/07/2022 7:26 AM

## 2022-01-08 DIAGNOSIS — I469 Cardiac arrest, cause unspecified: Secondary | ICD-10-CM | POA: Diagnosis not present

## 2022-01-08 DIAGNOSIS — J9601 Acute respiratory failure with hypoxia: Secondary | ICD-10-CM | POA: Diagnosis not present

## 2022-01-08 DIAGNOSIS — I214 Non-ST elevation (NSTEMI) myocardial infarction: Secondary | ICD-10-CM | POA: Diagnosis not present

## 2022-01-08 DIAGNOSIS — N179 Acute kidney failure, unspecified: Secondary | ICD-10-CM | POA: Diagnosis not present

## 2022-01-08 LAB — LEGIONELLA PNEUMOPHILA SEROGP 1 UR AG: L. pneumophila Serogp 1 Ur Ag: NEGATIVE

## 2022-01-08 LAB — GLUCOSE, CAPILLARY
Glucose-Capillary: 90 mg/dL (ref 70–99)
Glucose-Capillary: 104 mg/dL — ABNORMAL HIGH (ref 70–99)
Glucose-Capillary: 99 mg/dL (ref 70–99)
Glucose-Capillary: 184 mg/dL — ABNORMAL HIGH (ref 70–99)

## 2022-01-08 LAB — BASIC METABOLIC PANEL
Anion gap: 9 (ref 5–15)
BUN: 47 mg/dL — ABNORMAL HIGH (ref 8–23)
CO2: 24 mmol/L (ref 22–32)
Calcium: 8.4 mg/dL — ABNORMAL LOW (ref 8.9–10.3)
Chloride: 100 mmol/L (ref 98–111)
Creatinine, Ser: 1.39 mg/dL — ABNORMAL HIGH (ref 0.44–1.00)
GFR, Estimated: 38 mL/min — ABNORMAL LOW (ref >60)
Glucose, Bld: 99 mg/dL (ref 70–99)
Potassium: 4.1 mmol/L (ref 3.5–5.1)
Sodium: 133 mmol/L — ABNORMAL LOW (ref 135–145)

## 2022-01-08 MED ORDER — SPIRONOLACTONE 25 MG PO TABS
12.5000 mg | ORAL_TABLET | Freq: Every day | ORAL | Status: DC
  Administered 2022-01-08 – 2022-01-09 (×2): 12.5 mg via ORAL
  Filled 2022-01-08 (×2): qty 1
  Filled 2022-01-08 (×2): qty 0.5

## 2022-01-08 MED ORDER — ATORVASTATIN CALCIUM 80 MG PO TABS
80.0000 mg | ORAL_TABLET | Freq: Every day | ORAL | Status: DC
  Administered 2022-01-08: 80 mg via ORAL
  Filled 2022-01-08: qty 1

## 2022-01-08 MED ORDER — TORSEMIDE 20 MG PO TABS
20.0000 mg | ORAL_TABLET | Freq: Every day | ORAL | Status: DC
  Administered 2022-01-08 – 2022-01-09 (×2): 20 mg via ORAL
  Filled 2022-01-08 (×2): qty 1

## 2022-01-08 NOTE — Care Management Important Message (Signed)
Important Message  Patient Details  Name: Lindsey Pollard MRN: [id number] Date of Birth: 06-16-1941   Medicare Important Message Given:  Yes     Johnell Comings 01/08/2022, 3:29 PM

## 2022-01-08 NOTE — TOC Progression Note (Addendum)
Transition of Care [facility]) - Progression Note    Patient Details  Name: Lindsey Pollard MRN: [id number] Date of Birth: [birth year]  [facility] of [facility] CM/SW Contact  Margarito Liner, LCSW Phone Number: 01/08/2022, 9:14 AM  Clinical Narrative:   Sent therapy notes on hub to try and trigger SNF bed offers.  12:20 pm: Went by room to give SNF bed offers. Patient said she prefers to return home with home health. Patient lives home with her best friend and said they take care of each other. Patient said she has not gotten up this admission because staff won't let her. Sent secure chat to PT assigned today. Want to see how she does before setting up home health. Left bed offer list in the room with her in case she changes her mind.  3:51 pm: PT has changed recommendation to home health. Will start home health search. Patient is aware there is a chance we won't be able to find anyone to accept her referral.  4:38 pm: [facility] and [facility] declined. [facility] can only do PT and OT. [facility] can accept referral for PT, OT, RN but cannot start until the weekend. Tried calling patient in the room to let her know but no answer.  Expected Discharge Plan: Skilled Nursing Facility Barriers to Discharge: Continued Medical Work up  Expected Discharge Plan and Services Expected Discharge Plan: Skilled Nursing Facility       Living arrangements for the past 2 months: Single Family Home                                       Social Determinants of Health (SDOH) Interventions    Readmission Risk Interventions    01/05/2022    5:09 PM  Readmission Risk Prevention Plan  Transportation Screening Complete  PCP or Specialist Appt within 3-5 Days Complete  Social Work Consult for Recovery Care Planning/Counseling Not Complete  SW consult not completed comments NA  Palliative Care Screening Not Applicable  Medication Review Oceanographer) Referral to Pharmacy

## 2022-01-08 NOTE — Progress Notes (Signed)
[facility] Progress Note  Patient: Lindsey Pollard    ZOX:[id number]  DOA: 01/03/2022    Date of Service: the patient was seen and examined on 01/08/2022  Brief hospital course: Patient is an 81 year old female with past medical history of CAD status post CABG x2 in 2018 with history of PCI as well as ischemic cardiomyopathy, diabetes mellitus and stage III chronic kidney disease who presented to the emergency room on 7/12 with shortness of breath.  Following admission, patient started complaining of right-sided chest pain and then went into V-fib arrest and was pulseless.  CPR was initiated and patient was resuscitated, intubated for airway protection.  Postcardiac arrest EKG noted persistent diffuse ST depression and cardiology consulted.  Patient able to be extubated by 7/14.  Echocardiogram noted new moderate hypokinesis of the left ventricle mid apical and anterolateral wall.  Patient did require pressor support on morning of 7/14 to maintain MAP, but this was able to be weaned off successfully by 7/14 evening.  By 7/15, patient more awake and alert and appropriate.  Renal function slowly improving and troponin trending downward.  Seen by cardiology who are recommending cardiac rehab and outpatient follow-up.  Patient preferred to go home with home health versus skilled nursing.  Assessment and Plan: Assessment and Plan: * NSTEMI (non-ST elevated myocardial infarction) (HCC) Echocardiogram notes new hypokinesis as compared to echocardiogram from a month ago.  Cardiology following.  Recommendation for outpatient follow-up.  Continue to monitor renal function.  Cardiac rehab.  Cardiac arrest with successful resuscitation [facility]) Resuscitated.  Currently off of ventilator and pressor support  Acute respiratory failure with hypoxia (HCC) Weaned off of ventilator and high flow oxygen.  Secondary to non-STEMI.  Weaned off oxygen altogether by 7/16  AKI (acute kidney injury) (HCC) in the setting  of stage IIIa chronic Previously improving although has been trending back upward over the last few days.  Currently not on any diuretics and so it may be slight increase in volume overload so have restarted her home Demadex and Aldactone.  Recheck labs in the morning  Type II diabetes mellitus with renal manifestations (HCC) Well-controlled.  A1c a month ago at 6.8.  Sliding scale.  Chronic diastolic CHF (congestive heart failure) (HCC) Echocardiogram a month ago noted grade 1 diastolic dysfunction.  Monitor for decompensation.  Noted increase in creatinine, possibly from some worsening heart failure.  Restarting her Aldactone and Demadex  HTN (hypertension) Antihypertensives held.  Initially required pressor support on 7/14, possibly from cardiogenic shock which has since resolved  HLD (hyperlipidemia) On statin  Overweight (BMI 25.0-29.9) Meets criteria BMI greater than 25       Body mass index is 26.81 kg/m.  Nutrition Problem: Moderate Malnutrition Etiology: chronic illness     Consultants: Cardiology Critical care  Procedures: Echocardiogram Intubation  Antimicrobials: None  Code Status: Full code   Subjective: No complaints  Objective: Minimal tachycardia Vitals:   01/08/22 1123 01/08/22 1454  BP: (!) 115/44 (!) 124/47  Pulse: 82 86  Resp: 18 17  Temp: 98.2 F (36.8 C) 97.8 F (36.6 C)  SpO2: 97% 95%    Intake/Output Summary (Last 24 hours) at 01/08/2022 1605 Last data filed at 01/08/2022 0930 Gross per 24 hour  Intake 240 ml  Output 400 ml  Net -160 ml    Filed Weights   01/05/22 0350 01/06/22 0438 01/08/22 0300  Weight: 56.7 kg 59.9 kg 60.2 kg   Body mass index is 26.81 kg/m.  Exam:  General: Alert  and oriented x2, no acute distress HEENT: Normocephalic, atraumatic, mucous membranes are moist Cardiovascular: Regular rate and rhythm, S1-S2, 2/6 systolic ejection murmur Respiratory: Clear to auscultation bilaterally Abdomen: Soft,  nontender, nondistended, positive bowel sounds Musculoskeletal: No clubbing or cyanosis, trace pitting edema Skin: No skin breaks, tears or lesions Psychiatry: Appropriate, no evidence of psychoses Neurology: No focal deficits  Data Reviewed: Creatinine trending upward.  Disposition:  Status is: Inpatient Remains inpatient appropriate because: Follow-up on renal function.  Home with home health 7/18    Anticipated discharge date: 7/18  Family Communication: Left message with daughters DVT Prophylaxis: enoxaparin (LOVENOX) injection 30 mg Start: 01/06/22 2200    Author: Hollice Espy ,MD 01/08/2022 4:05 PM  To reach On-call, see care teams to locate the attending and reach out via [email or web address]. Between 7PM-7AM, please contact night-coverage If you still have difficulty reaching the attending provider, please page the [facility] (Director on Call) for [facility] on amion for assistance.

## 2022-01-08 NOTE — Progress Notes (Signed)
Physical Therapy Treatment Patient Details Name: Lindsey Pollard MRN: [id number] DOB: September 19, 1940 Today's Date: 01/08/2022   History of Present Illness Pt is an 81 y/o F admitted on 01/03/22 after presenting to the ED with c/o SOB & a fall 3 days ago, hitting the side of her head without LOC. Pt was placed on 2L via [place] but later c/o R sided chest pain & shortly after went into v-fib arrest without a pulse. CPR/ACLS initiated, pt was intubated for airway protection. Pt was extubated on 01/05/22. PMH: CAD s/p CABG, HTN, HLD, DM, PAD, CHF, CKD-3A, RLS    PT Comments    Asked for updated session due to pt improvement. Pt able to ambulate further distances this date with vitals WNL with exertion. No chest pain or dizziness with exertion. Encouraged to use RW in home environment at this time. Pt feels safe to dc home and recommendations updated to reflect HHPT. TOC notified. Pt excited to begin next phase of recovery.   Recommendations for follow up therapy are one component of a multi-disciplinary discharge planning process, led by the attending physician.  Recommendations may be updated based on patient status, additional functional criteria and insurance authorization.  Follow Up Recommendations  Home health PT Can patient physically be transported by private vehicle: Yes   Assistance Recommended at Discharge Set up Supervision/Assistance  Patient can return home with the following A little help with walking and/or transfers;A little help with bathing/dressing/bathroom;Assist for transportation;Help with stairs or ramp for entrance   Equipment Recommendations  None recommended by PT    Recommendations for Other Services       Precautions / Restrictions Precautions Precautions: Fall Restrictions Weight Bearing Restrictions: No     Mobility  Bed Mobility Overal bed mobility: Modified Independent Bed Mobility: Supine to Sit     Supine to sit: Modified independent (Device/Increase time)      General bed mobility comments: safe technique with no cues. Once seated, upright posture. HR and O2 sats WNL with exertion    Transfers Overall transfer level: Needs assistance Equipment used: Rolling walker (2 wheels) Transfers: Sit to/from Stand Sit to Stand: Supervision           General transfer comment: once standing, safe technique with RW.    Ambulation/Gait Ambulation/Gait assistance: Min guard Gait Distance (Feet): 80 Feet Assistive device: Rolling walker (2 wheels) Gait Pattern/deviations: Step-through pattern       General Gait Details: able to ambulate into the hallway with RW. O2 sats WNL and HR increased from 95bpm to 103bpm with exertion. Distance limited due to fatigue.   Stairs             Wheelchair Mobility    Modified Rankin (Stroke Patients Only)       Balance Overall balance assessment: Needs assistance Sitting-balance support: Feet supported, Bilateral upper extremity supported Sitting balance-Leahy Scale: Good     Standing balance support: Bilateral upper extremity supported Standing balance-Leahy Scale: Good                              Cognition Arousal/Alertness: Awake/alert Behavior During Therapy: WFL for tasks assessed/performed Overall Cognitive Status: Within Functional Limits for tasks assessed                                 General Comments: Pleasant, however [facility]  Exercises Other Exercises Other Exercises: ambulated to bathroom with cga. Able to perform self hygiene with supervision and maintain balance during hand hygiene. Safe technique    General Comments        Pertinent Vitals/Pain Pain Assessment Pain Assessment: No/denies pain    Home Living                          Prior Function            PT Goals (current goals can now be found in the care plan section) Acute Rehab PT Goals Patient Stated Goal: get better, go home PT Goal Formulation: With  patient Time For Goal Achievement: 01/20/22 Potential to Achieve Goals: Good Progress towards PT goals: Progressing toward goals    Frequency    Min 2X/week      PT Plan Discharge plan needs to be updated    Co-evaluation              AM-PAC PT "6 Clicks" Mobility   Outcome Measure  Help needed turning from your back to your side while in a flat bed without using bedrails?: None Help needed moving from lying on your back to sitting on the side of a flat bed without using bedrails?: None Help needed moving to and from a bed to a chair (including a wheelchair)?: A Little Help needed standing up from a chair using your arms (e.g., wheelchair or bedside chair)?: A Little Help needed to walk in hospital room?: A Little Help needed climbing 3-5 steps with a railing? : A Little 6 Click Score: 20    End of Session Equipment Utilized During Treatment: Gait belt Activity Tolerance: Patient tolerated treatment well Patient left: in chair;with chair alarm set Nurse Communication: Mobility status PT Visit Diagnosis: Unsteadiness on feet (R26.81);Muscle weakness (generalized) (M62.81);Difficulty in walking, not elsewhere classified (R26.2)     Time: 3016-0109 PT Time Calculation (min) (ACUTE ONLY): 24 min  Charges:  $Gait Training: 8-22 mins $Therapeutic Activity: 8-22 mins                     Elizabeth Palau, PT, DPT, GCS [phone]    Keylin Ferryman 01/08/2022, 2:33 PM

## 2022-01-08 NOTE — Progress Notes (Signed)
Lindsey Pollard       Patient ID: Lindsey Pollard MRN: [id number] DOB/AGE: 80-Jun-1942 80 y.o.  Admit date: 01/03/2022 Referring Physician Lisabeth Pick, NP  Primary Physician Dr. [name] Primary Cardiologist Dr. 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  Cardiology is consulted because she developed VF arrest while in the ED.    Interval History:  - echo with new moderate hypokinesis of LV mid apical, anterolateral wall - troponin peaked   at 6100 -The patient has been transferred to telemetry with continued normal sinus rhythm and slight improvements of heart rate control with addition of metoprolol.  There is been no evidence of atrial fibrillation or sinus tachycardia.  The patient otherwise is not able to tolerate guideline medical therapy yet other than metoprolol use.  We will continue use of dual antiplatelet therapy and continuation of treatments for pulmonary concerns -Currently the patient has had maximize medication management that she can handle     Past Medical History:  Diagnosis Date   Chest pain 12/06/2021   Diabetes mellitus without complication ([place])    Hyperlipidemia    Hypertension    Myocardial infarction [facility])     Past Surgical History:  Procedure Laterality Date   CARDIAC SURGERY     bypass   CORONARY/GRAFT ACUTE MI REVASCULARIZATION N/A 06/01/2021   Procedure: Coronary/Graft Acute MI Revascularization;  Surgeon: Yolonda Kida, MD;  Location: [place] [facility];  Service: Cardiovascular;  Laterality: N/A;   EYE SURGERY     cataract extraction   LEFT HEART CATH AND CORONARY  ANGIOGRAPHY N/A 06/01/2021   Procedure: LEFT HEART CATH AND CORONARY ANGIOGRAPHY;  Surgeon: Yolonda Kida, MD;  Location: [place] [facility];  Service: Cardiovascular;  Laterality: N/A;    Medications Prior to Admission  Medication Sig Dispense Refill Last Dose   acetaminophen (TYLENOL) 325 MG tablet Take 650 mg by mouth at bedtime.   Past Week   albuterol (VENTOLIN HFA) 108 (90 Base) MCG/ACT inhaler Inhale 1-2 puffs into the lungs every 4 (four) hours as needed for shortness of breath.   Past Week   aspirin 81 MG EC tablet Take by mouth.   Past Week   atorvastatin (LIPITOR) 80 MG tablet Take 80 mg by mouth every evening.   Past Week   baclofen (LIORESAL) 10 MG tablet Take 10 mg by mouth 3 (three) times daily.   Past Week   Cholecalciferol 25 MCG (1000 UT) capsule Take 1,000 Units by mouth 2 (two) times daily.   Past Week   clopidogrel (PLAVIX) 75 MG tablet Take 1 tablet (75 mg total) by mouth daily. 30 tablet 0 Past Week   insulin glargine (LANTUS) 100 UNIT/ML injection Inject 8 Units into the skin at bedtime.   Past Week   insulin lispro (HUMALOG) 100 UNIT/ML KwikPen Inject 0-2 Units into the skin 3 (three) times daily.   Past Week   ipratropium-albuterol (DUONEB) 0.5-2.5 (3) MG/3ML SOLN Take 3 mLs by nebulization every 6 (six) hours as needed (shortness of breath).   Past Week   isosorbide mononitrate (IMDUR) 30 MG  24 hr tablet Take 1 tablet (30 mg total) by mouth daily. 30 tablet 1 Past Week   lisinopril (ZESTRIL) 20 MG tablet Take 1 tablet (20 mg total) by mouth daily. 30 tablet 1 Past Week   metoprolol succinate (TOPROL-XL) 50 MG 24 hr tablet Take 50 mg by mouth daily.   Past Week   Multiple Vitamin (MULTIVITAMIN WITH MINERALS) TABS tablet Take 1 tablet by mouth daily.   Past Week   ondansetron (ZOFRAN-ODT) 4 MG disintegrating tablet Take 4 mg by mouth every 8 (eight) hours as needed.   Past Week   senna (SENOKOT) 8.6 MG tablet Take 2 tablets by mouth at bedtime.   Past Week    SPIRIVA HANDIHALER 18 MCG inhalation capsule Place 1 capsule into inhaler and inhale daily as needed for shortness of breath.   Past Week   spironolactone (ALDACTONE) 25 MG tablet Take 12.5 mg by mouth daily.   Past Week   torsemide (DEMADEX) 20 MG tablet Take 20 mg by mouth daily.   Past Week   nitroGLYCERIN (NITROSTAT) 0.4 MG SL tablet Place 0.4 mg under the tongue every 5 (five) minutes as needed for chest pain.    at prn   nystatin (MYCOSTATIN/NYSTOP) powder Apply three times a day under skin folds in groin (Patient not taking: Reported on 01/04/2022) 60 g 0 Not Taking   Social History   Socioeconomic History   Marital status: Divorced    Spouse name: Not on file   Number of children: Not on file   Years of education: Not on file   Highest education level: Not on file  Occupational History   Not on file  Tobacco Use   Smoking status: Former    Types: Cigarettes   Smokeless tobacco: Never  Substance and Sexual Activity   Alcohol use: Not Currently   Drug use: Not Currently   Sexual activity: Not on file  Other Topics Concern   Not on file  Social History Narrative   Not on file   Social Determinants of Health   Financial Resource Strain: Not on file  Food Insecurity: Not on file  Transportation Needs: Not on file  Physical Activity: Not on file  Stress: Not on file  Social Connections: Not on file  Intimate Partner Violence: Not on file    Family History  Problem Relation Age of Onset   Congestive Heart Failure Mother    Diabetes Father    Breast cancer Neg Hx       PHYSICAL EXAM General: Elderly and critically ill appearing Caucasian female, intubated and minimally sedated in the ICU.   HEENT:  Normocephalic and atraumatic. Neck:  No JVD.  Lungs: Mechanically ventilated coarse breath sounds anteriorly. Heart: Bradycardic but regular, S1-S2 present with 2/6 systolic murmur abdomen: Non-distended appearing.  Msk: Normal strength and tone for age. Extremities:  Warm and well perfused. No clubbing, cyanosis.  No peripheral edema.  Safety mitts on bilateral hands Neuro: Intubated and sedated Psych: Intubated and sedated  Labs:   Lab Results  Component Value Date   WBC 10.2 01/07/2022   HGB 8.5 (L) 01/07/2022   HCT 26.1 (L) 01/07/2022   MCV 97.0 01/07/2022   PLT 156 01/07/2022    Recent Labs  Lab 01/03/22 2319 01/04/22 0514 01/08/22 0426  NA 140   < > 133*  K 3.1*   < > 4.1  CL 101   < > 100  CO2 26   < > 24  BUN 35*   < >  47*  CREATININE 2.06*   < > 1.39*  CALCIUM 8.5*   < > 8.4*  PROT 5.4*  --   --   BILITOT 1.2  --   --   ALKPHOS 111  --   --   ALT 146*  --   --   AST 245*  --   --   GLUCOSE 179*   < > 99   < > = values in this interval not displayed.    No results found for: "CKTOTAL", "CKMB", "CKMBINDEX", "TROPONINI"  Lab Results  Component Value Date   CHOL 102 12/07/2021   CHOL 127 06/01/2021   Lab Results  Component Value Date   HDL 31 (L) 12/07/2021   HDL 47 06/01/2021   Lab Results  Component Value Date   LDLCALC 45 12/07/2021   LDLCALC 49 06/01/2021   Lab Results  Component Value Date   TRIG 101 01/04/2022   TRIG 128 12/07/2021   TRIG 154 (H) 06/01/2021   Lab Results  Component Value Date   CHOLHDL 3.3 12/07/2021   CHOLHDL 2.7 06/01/2021   No results found for: "LDLDIRECT"    Radiology: ECHOCARDIOGRAM LIMITED  Result Date: 01/04/2022    ECHOCARDIOGRAM LIMITED REPORT   Patient Name:   Lindsey Pollard [facility] Date of Exam: 01/04/2022 Medical Rec #:  [id number]     Height:       59.0 in Accession #:    [id number]    Weight:       127.4 lb Date of Birth:  [birth year]     BSA:          1.523 m Patient Age:    81 years      BP:           128/50 mmHg Patient Gender: F             HR:           54 bpm. Exam Location:  [facility] Procedure: Limited Echo, Color Doppler and Cardiac Doppler Indications:     Cardiac Areest I46.9  History:         Patient has prior history of Echocardiogram examinations, most                  recent  12/07/2021. Previous Myocardial Infarction,                  Signs/Symptoms:Chest Pain; Risk Factors:Diabetes and                  Hypertension.  Sonographer:     Sherrie Sport Referring Phys:  [id number] [place] TANG Diagnosing Phys: Serafina Royals MD  Sonographer Comments: Image acquisition challenging due to patient behavioral factors. IMPRESSIONS  1. The left ventricle demonstrates regional wall motion abnormalities (see scoring diagram/findings for description).  2. Right ventricular systolic function is normal. The right ventricular size is normal.  3. Left atrial size was moderately dilated.  4. The mitral valve is normal in structure. Moderate mitral valve regurgitation.  5. Tricuspid valve regurgitation is mild to moderate.  6. The aortic valve is normal in structure. Aortic valve regurgitation is mild. FINDINGS  Left Ventricle: The left ventricle demonstrates regional wall motion abnormalities. Moderate hypokinesis of the left ventricular, mid-apical anterolateral wall. Right Ventricle: The right ventricular size is normal. No increase in right ventricular wall thickness. Right ventricular systolic function is normal. Left Atrium: Left atrial size was moderately dilated. Right Atrium: Right atrial size was normal in size. Pericardium:  There is no evidence of pericardial effusion. Mitral Valve: The mitral valve is normal in structure. Moderate mitral valve regurgitation. Tricuspid Valve: The tricuspid valve is normal in structure. Tricuspid valve regurgitation is mild to moderate. Aortic Valve: The aortic valve is normal in structure. Aortic valve regurgitation is mild. Pulmonic Valve: The pulmonic valve was normal in structure. Pulmonic valve regurgitation is trivial. Aorta: The aortic root and ascending aorta are structurally normal, with no evidence of dilitation. IAS/Shunts: No atrial level shunt detected by color flow Doppler. LEFT VENTRICLE PLAX 2D LVIDd:         4.40 cm LVIDs:         3.20 cm LV PW:          1.10 cm LV IVS:        1.00 cm LVOT diam:     2.00 cm LVOT Area:     3.14 cm  LEFT ATRIUM         Index LA diam:    3.40 cm 2.23 cm/m   AORTA Ao Root diam: 2.90 cm  SHUNTS Systemic Diam: 2.00 cm Lindsey Hooker MD Electronically signed by Lindsey Hooker MD Signature Date/Time: 01/04/2022/4:56:35 PM    Final    DG Chest 1 View  Result Date: 01/04/2022 CLINICAL DATA:  81 year old female intubated.  Post CPR. EXAM: CHEST  1 VIEW COMPARISON:  Portable chest 01/03/2022 and earlier. FINDINGS: Portable AP semi upright view at 0611 hours. Endotracheal tube tip partially obscured by sternotomy hardware, but appears to be in good position between the level the clavicles and carina. Enteric tube courses into the abdomen, tip is not included. Stable pacer or resuscitation pads over the left chest. Stable lung volumes. Mediastinal contours remain normal. Allowing for portable technique the lungs are clear. No pneumothorax or pleural effusion identified. Negative visible bowel gas. Stable visualized osseous structures. IMPRESSION: 1. Satisfactory ET tube position. Enteric tube courses to the abdomen, tip not included. 2. No acute cardiopulmonary abnormality. Electronically Signed   By: Odessa Fleming M.D.   On: 01/04/2022 07:26   DG Chest Portable 1 View  Result Date: 01/03/2022 CLINICAL DATA:  Intubation, post CPR EXAM: PORTABLE CHEST 1 VIEW COMPARISON:  01/03/2022 FINDINGS: Endotracheal tube is in the right mainstem bronchus. Recommend retracting approximately 5 cm. NG tube enters the stomach. Prior CABG. Heart is normal size. Increasing airspace disease throughout the left lung which may reflect atelectasis due to right mainstem intubation. Similar right upper lobe opacity/atelectasis. No effusions. IMPRESSION: Right mainstem intubation. Recommend retracting endotracheal tube approximately 5 cm. Areas of airspace disease in the left lung and right upper lobe may be related to atelectasis from right mainstem intubation.  These results were called by telephone at the time of interpretation on 01/03/2022 at 10:21 pm to provider [facility] , who verbally acknowledged these results. Electronically Signed   By: Charlett Nose M.D.   On: 01/03/2022 22:24   CT Cervical Spine Wo Contrast  Result Date: 01/03/2022 CLINICAL DATA:  Neck trauma (Age >= 65y) EXAM: CT CERVICAL SPINE WITHOUT CONTRAST TECHNIQUE: Multidetector CT imaging of the cervical spine was performed without intravenous contrast. Multiplanar CT image reconstructions were also generated. RADIATION DOSE REDUCTION: This exam was performed according to the departmental dose-optimization program which includes automated exposure control, adjustment of the mA and/or kV according to patient size and/or use of iterative reconstruction technique. COMPARISON:  None Available. FINDINGS: Alignment: Normal Skull base and vertebrae: No acute fracture. No primary bone lesion or focal pathologic process. Soft  tissues and spinal canal: No prevertebral fluid or swelling. No visible canal hematoma. 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 Mild bilateral degenerative facet disease. Upper chest: No acute findings Other: None IMPRESSION: Degenerative disc and facet disease.  No acute bony abnormality. Electronically Signed   By: Rolm Baptise M.D.   On: 01/03/2022 20:10   CT HEAD WO CONTRAST (5MM)  Result Date: 01/03/2022 CLINICAL DATA:  Head trauma, minor (Age >= 65y) EXAM: CT HEAD WITHOUT CONTRAST TECHNIQUE: Contiguous axial images were obtained from the base of the skull through the vertex without intravenous contrast. RADIATION DOSE REDUCTION: This exam was performed according to the departmental dose-optimization program which includes automated exposure control, adjustment of the mA and/or kV according to patient size and/or use of iterative reconstruction technique. COMPARISON:  10/03/2021 FINDINGS: Brain: No acute intracranial  abnormality. Specifically, no hemorrhage, hydrocephalus, mass lesion, acute infarction, or significant intracranial injury. Vascular: No hyperdense vessel or unexpected calcification. Skull: No acute calvarial abnormality. Sinuses/Orbits: No acute findings Other: None IMPRESSION: Normal study for patient's age. Electronically Signed   By: Rolm Baptise M.D.   On: 01/03/2022 20:08   CT CHEST ABDOMEN PELVIS WO CONTRAST  Result Date: 01/03/2022 CLINICAL DATA:  Pneumonia, complication suspected, xray done. Shortness of breath. EXAM: CT CHEST, ABDOMEN AND PELVIS WITHOUT CONTRAST TECHNIQUE: Multidetector CT imaging of the chest, abdomen and pelvis was performed following the standard protocol without IV contrast. RADIATION DOSE REDUCTION: This exam was performed according to the departmental dose-optimization program which includes automated exposure control, adjustment of the mA and/or kV according to patient size and/or use of iterative reconstruction technique. COMPARISON:  None Available. FINDINGS: CT CHEST FINDINGS Cardiovascular: Heavily calcified aorta and coronary arteries. Prior CABG. Heart is normal size. Aorta is normal caliber. Mediastinum/Nodes: No mediastinal, hilar, or axillary adenopathy. Trachea and esophagus are unremarkable. Thyroid unremarkable. Lungs/Pleura: Lungs are clear. No focal airspace opacities or suspicious nodules. No effusions. Musculoskeletal: Chest wall soft tissues are unremarkable. No acute bony abnormality. CT ABDOMEN PELVIS FINDINGS Hepatobiliary: Small gallstone within the gallbladder. No focal hepatic abnormality. Pancreas: No focal abnormality or ductal dilatation. Spleen: No focal abnormality.  Normal size. Adrenals/Urinary Tract: Left kidney is atrophic. No renal or adrenal mass. No stones or hydronephrosis. Urinary bladder unremarkable. Stomach/Bowel: Left colonic diverticulosis. No active diverticulitis. Stomach and small bowel decompressed, unremarkable. Vascular/Lymphatic:  Heavily calcified aorta. No evidence of aneurysm or adenopathy. Reproductive: Calcified fibroids in the uterus.  No adnexal masses. Other: No free fluid or free air. Musculoskeletal: No acute bony abnormality. Scoliosis and degenerative changes in the lumbar spine. IMPRESSION: No acute cardiopulmonary disease. Diffuse aortoiliac atherosclerosis.  No aneurysm. Cholelithiasis. Left colonic diverticulosis.  No active diverticulitis. No acute findings in the abdomen or pelvis. Electronically Signed   By: Rolm Baptise M.D.   On: 01/03/2022 20:05   DG Chest 2 View  Result Date: 01/03/2022 CLINICAL DATA:  Shortness of breath EXAM: CHEST - 2 VIEW COMPARISON:  12/06/2021 FINDINGS: Prior CABG. Heart and mediastinal contours are within normal limits. No focal opacities or effusions. No acute bony abnormality. IMPRESSION: No active cardiopulmonary disease. Electronically Signed   By: Rolm Baptise M.D.   On: 01/03/2022 19:25     TELEMETRY reviewed by me: Currently sinus bradycardia with rate 40s.  ASSESSMENT AND PLAN:  Alieza Stepanski is an 63yoF with a PMH of CAD s/p CABG x2 in 2018 (patent LIMA to LAD,  occluded SVG to OM1 by [facility] 05/2021), h/o  PCI x1 distal RCA (50% ISR by [facility] 05/2021), ischemic CM with LVEF 55-60%, g1dd, mod MR 11/2021, CKD 3, hyperlipidemia, type 2 diabetes, CAD, GERD who presented to [facility] ED 01/03/2022 with shortness of breath.  Cardiology is consulted because she developed VF arrest while in the ED.   #VF arrest with successful resuscitation s/p 1 round of CPR and defibrillation #NSTEMI with history of significant CAD s/p CABG x2 with known occluded SVG to OM1, LIMA to LAD patent by [facility] 05/2021 #Ischemic cardiomyopathy #Acute hypoxic respiratory failure  -Continue dual antiplatelet therapy at this time for non-ST elevation myocardial infarction -Continue atorvastatin 80 mg daily -Restarted metoprolol and will consider isosorbide as blood pressure permits and/or if patient has symptoms with  physical activity -Limited echo resulted with new wall motion abnormality as above -- Continue supportive care for pulmonary issues -Begin ambulation and cardiac rehab as able -No further cardiac diagnostics necessary at this time -Possible discharge to home soon is ambulating well without evidence of significant symptoms. -Call if further questions Signed: Lamar Blinks ,   01/08/2022, 12:41 PM [facility] Cardiology

## 2022-01-09 DIAGNOSIS — J9601 Acute respiratory failure with hypoxia: Secondary | ICD-10-CM | POA: Diagnosis not present

## 2022-01-09 DIAGNOSIS — N179 Acute kidney failure, unspecified: Secondary | ICD-10-CM | POA: Diagnosis not present

## 2022-01-09 DIAGNOSIS — I469 Cardiac arrest, cause unspecified: Secondary | ICD-10-CM | POA: Diagnosis not present

## 2022-01-09 DIAGNOSIS — I214 Non-ST elevation (NSTEMI) myocardial infarction: Secondary | ICD-10-CM | POA: Diagnosis not present

## 2022-01-09 LAB — BASIC METABOLIC PANEL
Anion gap: 8 (ref 5–15)
BUN: 44 mg/dL — ABNORMAL HIGH (ref 8–23)
CO2: 25 mmol/L (ref 22–32)
Calcium: 8.1 mg/dL — ABNORMAL LOW (ref 8.9–10.3)
Chloride: 99 mmol/L (ref 98–111)
Creatinine, Ser: 1.18 mg/dL — ABNORMAL HIGH (ref 0.44–1.00)
GFR, Estimated: 47 mL/min — ABNORMAL LOW (ref >60)
Glucose, Bld: 99 mg/dL (ref 70–99)
Potassium: 3.4 mmol/L — ABNORMAL LOW (ref 3.5–5.1)
Sodium: 132 mmol/L — ABNORMAL LOW (ref 135–145)

## 2022-01-09 LAB — CULTURE, BLOOD (ROUTINE X 2)
Culture: NO GROWTH
Culture: NO GROWTH
Special Requests: ADEQUATE
Special Requests: ADEQUATE

## 2022-01-09 LAB — GLUCOSE, CAPILLARY
Glucose-Capillary: 105 mg/dL — ABNORMAL HIGH (ref 70–99)
Glucose-Capillary: 196 mg/dL — ABNORMAL HIGH (ref 70–99)
Glucose-Capillary: 117 mg/dL — ABNORMAL HIGH (ref 70–99)

## 2022-01-09 LAB — TROPONIN I (HIGH SENSITIVITY): Troponin I (High Sensitivity): 6335 ng/L (ref <18)

## 2022-01-09 MED ORDER — ENSURE ENLIVE PO LIQD: 237.0000 mL | Freq: Three times a day (TID) | ORAL | 12 refills | Status: AC

## 2022-01-09 NOTE — Progress Notes (Signed)
Physical Therapy Treatment Patient Details Name: Lindsey Pollard MRN: [id number] DOB: Feb 27, 1941 Today's Date: 01/09/2022   History of Present Illness Pt is an 81 y/o F admitted on 01/03/22 after presenting to the ED with c/o SOB & a fall 3 days ago, hitting the side of her head without LOC. Pt was placed on 2L via [place] but later c/o R sided chest pain & shortly after went into v-fib arrest without a pulse. CPR/ACLS initiated, pt was intubated for airway protection. Pt was extubated on 01/05/22. PMH: CAD s/p CABG, HTN, HLD, DM, PAD, CHF, CKD-3A, RLS    PT Comments    Patient received in recliner, she is requesting to get back into bed. Declines ambulating further at this time. She required min guard/supervision for mobility. Patient will continue to benefit from skilled PT to improve strength and functional independence.     Recommendations for follow up therapy are one component of a multi-disciplinary discharge planning process, led by the attending physician.  Recommendations may be updated based on patient status, additional functional criteria and insurance authorization.  Follow Up Recommendations  Home health PT Can patient physically be transported by private vehicle: Yes   Assistance Recommended at Discharge Intermittent Supervision/Assistance  Patient can return home with the following A little help with walking and/or transfers;A little help with bathing/dressing/bathroom;Assist for transportation;Help with stairs or ramp for entrance   Equipment Recommendations  None recommended by PT    Recommendations for Other Services       Precautions / Restrictions Precautions Precautions: Fall Restrictions Weight Bearing Restrictions: No     Mobility  Bed Mobility Overal bed mobility: Modified Independent Bed Mobility: Sit to Supine     Supine to sit: Modified independent (Device/Increase time)          Transfers Overall transfer level: Modified independent Equipment used:  Rolling walker (2 wheels) Transfers: Sit to/from Stand Sit to Stand: Supervision Stand pivot transfers: Supervision              Ambulation/Gait               General Gait Details: patient declined ambulation, just wanted to get back to bed.   Stairs             Wheelchair Mobility    Modified Rankin (Stroke Patients Only)       Balance Overall balance assessment: Modified Independent Sitting-balance support: Feet supported Sitting balance-Leahy Scale: Good     Standing balance support: Bilateral upper extremity supported, During functional activity, Reliant on assistive device for balance Standing balance-Leahy Scale: Good                              Cognition Arousal/Alertness: Awake/alert Behavior During Therapy: WFL for tasks assessed/performed Overall Cognitive Status: Within Functional Limits for tasks assessed                                          Exercises      General Comments        Pertinent Vitals/Pain Pain Assessment Pain Assessment: No/denies pain    Home Living                          Prior Function            PT Goals (  current goals can now be found in the care plan section) Acute Rehab PT Goals Patient Stated Goal: get better, go home PT Goal Formulation: With patient Time For Goal Achievement: 01/20/22 Potential to Achieve Goals: Good Progress towards PT goals: Progressing toward goals    Frequency    Min 2X/week      PT Plan Current plan remains appropriate    Co-evaluation              AM-PAC PT "6 Clicks" Mobility   Outcome Measure  Help needed turning from your back to your side while in a flat bed without using bedrails?: None Help needed moving from lying on your back to sitting on the side of a flat bed without using bedrails?: None Help needed moving to and from a bed to a chair (including a wheelchair)?: A Little Help needed standing up from a  chair using your arms (e.g., wheelchair or bedside chair)?: A Little Help needed to walk in hospital room?: A Little Help needed climbing 3-5 steps with a railing? : A Little 6 Click Score: 20    End of Session   Activity Tolerance: Patient tolerated treatment well Patient left: in bed;with call bell/phone within reach;with bed alarm set Nurse Communication: Mobility status PT Visit Diagnosis: Muscle weakness (generalized) (M62.81)     Time:  -     Charges:     No charge                   [employer], PT, [employer] 01/09/22,1:24 PM

## 2022-01-09 NOTE — TOC Transition Note (Signed)
Transition of Care [facility]) - CM/SW Discharge Note   Patient Details  Name: Lindsey Pollard MRN: [id number] Date of Birth: 1940-09-24  Transition of Care [facility]) CM/SW Contact:  Margarito Liner, LCSW Phone Number: 01/09/2022, 1:15 PM   Clinical Narrative: Patient has orders to discharge home today. [facility] representative is aware. No further concerns. CSW signing off.    Final next level of care: Home w Home Health Services Barriers to Discharge: Barriers Resolved   Patient Goals and CMS Choice Patient states their goals for this hospitalization and ongoing recovery are:: Patient just extubated   Choice offered to / list presented to : Patient  Discharge Placement                    Patient and family notified of of transfer: 01/09/22  Discharge Plan and Services                DME Arranged: Dan Humphreys rolling with seat DME Agency: [employer] Date DME Agency Contacted: 01/09/22   Representative spoke with at DME Agency: Bjorn Loser HH Arranged: RN, PT, OT [facility] Agency: [facility] Date [facility] Agency Contacted: 01/09/22   Representative spoke with at [facility] Agency: [employer]  Social Determinants of Health (SDOH) Interventions     Readmission Risk Interventions    01/05/2022    5:09 PM  Readmission Risk Prevention Plan  Transportation Screening Complete  PCP or Specialist Appt within 3-5 Days Complete  Social Work Consult for Recovery Care Planning/Counseling Not Complete  SW consult not completed comments NA  Palliative Care Screening Not Applicable  Medication Review Oceanographer) Referral to Pharmacy

## 2022-01-09 NOTE — Discharge Summary (Signed)
Physician Discharge Summary   Patient: Lindsey Pollard MRN: [id number] DOB: 1941-03-27  Admit date:     01/03/2022  Discharge date: 01/09/22  Discharge Physician: Annita Brod   PCP: [name], MD   Recommendations at discharge:   Patient will follow-up with cardiology in the next 2 weeks Home health PT and OT with cardiac rehab  Discharge Diagnoses: Principal Problem:   NSTEMI (non-ST elevated myocardial infarction) [facility]) Active Problems:   Cardiac arrest with successful resuscitation [facility])   Acute respiratory failure with hypoxia ([place])   AKI (acute kidney injury) ([place]) in the setting of stage IIIa chronic   Chronic diastolic CHF (congestive heart failure) ([place])   Type II diabetes mellitus with renal manifestations ([place])   HTN (hypertension)   HLD (hyperlipidemia)   Overweight (BMI 25.0-29.9)  Resolved Problems:   * No resolved hospital problems. [facility] Course: Patient is an 81 year old female with past medical history of CAD status post CABG x2 in 2018 with history of PCI as well as ischemic cardiomyopathy, diabetes mellitus and stage III chronic kidney disease who presented to the emergency room on 7/12 with shortness of breath.  Following admission, patient started complaining of right-sided chest pain and then went into V-fib arrest and was pulseless.  CPR was initiated and patient was resuscitated, intubated for airway protection.  Postcardiac arrest EKG noted persistent diffuse ST depression and cardiology consulted.  Patient able to be extubated by 7/14.  Echocardiogram noted new moderate hypokinesis of the left ventricle mid apical and anterolateral wall.  Patient did require pressor support on morning of 7/14 to maintain MAP, but this was able to be weaned off successfully by 7/14 evening.  By 7/15, patient more awake and alert and appropriate.  Renal function slowly improving and troponin trending downward.  Seen by cardiology who are recommending cardiac  rehab and outpatient follow-up.  Patient preferred to go home with home health versus skilled nursing.  Assessment and Plan: * NSTEMI (non-ST elevated myocardial infarction) ([place]) Echocardiogram notes new hypokinesis as compared to echocardiogram from a month ago.  Cardiology following.  Recommendation for outpatient follow-up.  Continue to monitor renal function.  Cardiac rehab.  Cardiac arrest with successful resuscitation [facility]) Resuscitated.  Currently off of ventilator and pressor support  Acute respiratory failure with hypoxia (HCC) Weaned off of ventilator and high flow oxygen.  Secondary to non-STEMI.  Weaned off oxygen altogether by 7/16  AKI (acute kidney injury) ([place]) in the setting of stage IIIa chronic Previously improving although had been been trending back upward over the last few days before discharge.  Patient restarted on her home Demadex and Aldactone and creatinine back to baseline.  Patient will have labs rechecked by her PCP in the next month.  With renal function normalized and blood pressure slightly trending upward, will restart her Zestril upon discharge.  Type II diabetes mellitus with renal manifestations ([place]) Well-controlled.  A1c a month ago at 6.8.  Sliding scale.  Chronic diastolic CHF (congestive heart failure) (HCC) Echocardiogram a month ago noted grade 1 diastolic dysfunction.  Monitor for decompensation.  Noted increase in creatinine, possibly from some worsening heart failure.  Restarting her Aldactone and Demadex  HTN (hypertension) Antihypertensives held.  Initially required pressor support on 7/14, possibly from cardiogenic shock which has since resolved  HLD (hyperlipidemia) On statin  Overweight (BMI 25.0-29.9) Meets criteria BMI greater than 25         Consultants: Cardiology Critical care   Procedures: Echocardiogram Intubation  Disposition: Home health Diet recommendation:  Discharge Diet Orders (From admission, onward)      Start     Ordered   01/09/22 0000  Diet - low sodium heart healthy        01/09/22 1306           Cardiac diet DISCHARGE MEDICATION: Allergies as of 01/09/2022       Reactions   Tetanus Toxoids Swelling   Tetanus and diphtheria toxids   Penicillin G Rash        Medication List     TAKE these medications    acetaminophen 325 MG tablet Commonly known as: TYLENOL Take 650 mg by mouth at bedtime.   albuterol 108 (90 Base) MCG/ACT inhaler Commonly known as: VENTOLIN HFA Inhale 1-2 puffs into the lungs every 4 (four) hours as needed for shortness of breath.   aspirin EC 81 MG tablet Take by mouth.   atorvastatin 80 MG tablet Commonly known as: LIPITOR Take 80 mg by mouth every evening.   Cholecalciferol 25 MCG (1000 UT) capsule Take 1,000 Units by mouth 2 (two) times daily.   clopidogrel 75 MG tablet Commonly known as: PLAVIX Take 1 tablet (75 mg total) by mouth daily.   feeding supplement Liqd Take 237 mLs by mouth 3 (three) times daily between meals.   insulin glargine 100 UNIT/ML injection Commonly known as: LANTUS Inject 8 Units into the skin at bedtime.   insulin lispro 100 UNIT/ML KwikPen Commonly known as: HUMALOG Inject 0-2 Units into the skin 3 (three) times daily.   ipratropium-albuterol 0.5-2.5 (3) MG/3ML Soln Commonly known as: DUONEB Take 3 mLs by nebulization every 6 (six) hours as needed (shortness of breath).   isosorbide mononitrate 30 MG 24 hr tablet Commonly known as: IMDUR Take 1 tablet (30 mg total) by mouth daily.   lisinopril 20 MG tablet Commonly known as: ZESTRIL Take 1 tablet (20 mg total) by mouth daily.   metoprolol succinate 50 MG 24 hr tablet Commonly known as: TOPROL-XL Take 50 mg by mouth daily.   multivitamin with minerals Tabs tablet Take 1 tablet by mouth daily.   nitroGLYCERIN 0.4 MG SL tablet Commonly known as: NITROSTAT Place 0.4 mg under the tongue every 5 (five) minutes as needed for chest pain.    ondansetron 4 MG disintegrating tablet Commonly known as: ZOFRAN-ODT Take 4 mg by mouth every 8 (eight) hours as needed.   senna 8.6 MG tablet Commonly known as: SENOKOT Take 2 tablets by mouth at bedtime.   Spiriva HandiHaler 18 MCG inhalation capsule Generic drug: tiotropium Place 1 capsule into inhaler and inhale daily as needed for shortness of breath.   spironolactone 25 MG tablet Commonly known as: ALDACTONE Take 12.5 mg by mouth daily.   torsemide 20 MG tablet Commonly known as: DEMADEX Take 20 mg by mouth daily.               Durable Medical Equipment  (From admission, onward)           Start     Ordered   01/09/22 1216  For home use only DME 4 wheeled rolling walker with seat  Once       Question:  Patient needs a walker to treat with the following condition  Answer:  Weakness generalized   01/09/22 1216            Discharge Exam: Filed Weights   01/08/22 0300 01/09/22 0408 01/09/22 0430  Weight: 60.2 kg 47.7 kg 54.8 kg  General: Alert and oriented x3, no acute distress Cardiovascular: Regular rate and rhythm, S1-S2, 2 out of 6 systolic ejection murmur Lungs: Clear to auscultation bilaterally  Condition at discharge: good  The results of significant diagnostics from this hospitalization (including imaging, microbiology, ancillary and laboratory) are listed below for reference.   Imaging Studies: ECHOCARDIOGRAM LIMITED  Result Date: 01/04/2022    ECHOCARDIOGRAM LIMITED REPORT   Patient Name:   GRACIANNA VINK [facility] Date of Exam: 01/04/2022 Medical Rec #:  [id number]     Height:       59.0 in Accession #:    [id number]    Weight:       127.4 lb Date of Birth:  09-27-1940     BSA:          1.523 m Patient Age:    81 years      BP:           128/50 mmHg Patient Gender: F             HR:           54 bpm. Exam Location:  [facility] Procedure: Limited Echo, Color Doppler and Cardiac Doppler Indications:     Cardiac Areest I46.9  History:         Patient has prior  history of Echocardiogram examinations, most                  recent 12/07/2021. Previous Myocardial Infarction,                  Signs/Symptoms:Chest Pain; Risk Factors:Diabetes and                  Hypertension.  Sonographer:     Cristela Blue Referring Phys:  [id number] Tonna Corner MICHELLE TANG Diagnosing Phys: Arnoldo Hooker MD  Sonographer Comments: Image acquisition challenging due to patient behavioral factors. IMPRESSIONS  1. The left ventricle demonstrates regional wall motion abnormalities (see scoring diagram/findings for description).  2. Right ventricular systolic function is normal. The right ventricular size is normal.  3. Left atrial size was moderately dilated.  4. The mitral valve is normal in structure. Moderate mitral valve regurgitation.  5. Tricuspid valve regurgitation is mild to moderate.  6. The aortic valve is normal in structure. Aortic valve regurgitation is mild. FINDINGS  Left Ventricle: The left ventricle demonstrates regional wall motion abnormalities. Moderate hypokinesis of the left ventricular, mid-apical anterolateral wall. Right Ventricle: The right ventricular size is normal. No increase in right ventricular wall thickness. Right ventricular systolic function is normal. Left Atrium: Left atrial size was moderately dilated. Right Atrium: Right atrial size was normal in size. Pericardium: There is no evidence of pericardial effusion. Mitral Valve: The mitral valve is normal in structure. Moderate mitral valve regurgitation. Tricuspid Valve: The tricuspid valve is normal in structure. Tricuspid valve regurgitation is mild to moderate. Aortic Valve: The aortic valve is normal in structure. Aortic valve regurgitation is mild. Pulmonic Valve: The pulmonic valve was normal in structure. Pulmonic valve regurgitation is trivial. Aorta: The aortic root and ascending aorta are structurally normal, with no evidence of dilitation. IAS/Shunts: No atrial level shunt detected by color flow Doppler. LEFT  VENTRICLE PLAX 2D LVIDd:         4.40 cm LVIDs:         3.20 cm LV PW:         1.10 cm LV IVS:        1.00 cm LVOT diam:  2.00 cm LVOT Area:     3.14 cm  LEFT ATRIUM         Index LA diam:    3.40 cm 2.23 cm/m   AORTA Ao Root diam: 2.90 cm  SHUNTS Systemic Diam: 2.00 cm Serafina Royals MD Electronically signed by Serafina Royals MD Signature Date/Time: 01/04/2022/4:56:35 PM    Final    DG Chest 1 View  Result Date: 01/04/2022 CLINICAL DATA:  81 year old female intubated.  Post CPR. EXAM: CHEST  1 VIEW COMPARISON:  Portable chest 01/03/2022 and earlier. FINDINGS: Portable AP semi upright view at 0611 hours. Endotracheal tube tip partially obscured by sternotomy hardware, but appears to be in good position between the level the clavicles and carina. Enteric tube courses into the abdomen, tip is not included. Stable pacer or resuscitation pads over the left chest. Stable lung volumes. Mediastinal contours remain normal. Allowing for portable technique the lungs are clear. No pneumothorax or pleural effusion identified. Negative visible bowel gas. Stable visualized osseous structures. IMPRESSION: 1. Satisfactory ET tube position. Enteric tube courses to the abdomen, tip not included. 2. No acute cardiopulmonary abnormality. Electronically Signed   By: Genevie Ann M.D.   On: 01/04/2022 07:26   DG Chest Portable 1 View  Result Date: 01/03/2022 CLINICAL DATA:  Intubation, post CPR EXAM: PORTABLE CHEST 1 VIEW COMPARISON:  01/03/2022 FINDINGS: Endotracheal tube is in the right mainstem bronchus. Recommend retracting approximately 5 cm. NG tube enters the stomach. Prior CABG. Heart is normal size. Increasing airspace disease throughout the left lung which may reflect atelectasis due to right mainstem intubation. Similar right upper lobe opacity/atelectasis. No effusions. IMPRESSION: Right mainstem intubation. Recommend retracting endotracheal tube approximately 5 cm. Areas of airspace disease in the left lung and right  upper lobe may be related to atelectasis from right mainstem intubation. These results were called by telephone at the time of interpretation on 01/03/2022 at 10:21 pm to provider [facility] , who verbally acknowledged these results. Electronically Signed   By: Rolm Baptise M.D.   On: 01/03/2022 22:24   CT Cervical Spine Wo Contrast  Result Date: 01/03/2022 CLINICAL DATA:  Neck trauma (Age >= 65y) EXAM: CT CERVICAL SPINE WITHOUT CONTRAST TECHNIQUE: Multidetector CT imaging of the cervical spine was performed without intravenous contrast. Multiplanar CT image reconstructions were also generated. RADIATION DOSE REDUCTION: This exam was performed according to the departmental dose-optimization program which includes automated exposure control, adjustment of the mA and/or kV according to patient size and/or use of iterative reconstruction technique. COMPARISON:  None Available. FINDINGS: Alignment: Normal Skull base and vertebrae: No acute fracture. No primary bone lesion or focal pathologic process. Soft tissues and spinal canal: No prevertebral fluid or swelling. No visible canal hematoma. 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 Mild bilateral degenerative facet disease. Upper chest: No acute findings Other: None IMPRESSION: Degenerative disc and facet disease.  No acute bony abnormality. Electronically Signed   By: Rolm Baptise M.D.   On: 01/03/2022 20:10   CT HEAD WO CONTRAST (5MM)  Result Date: 01/03/2022 CLINICAL DATA:  Head trauma, minor (Age >= 65y) EXAM: CT HEAD WITHOUT CONTRAST TECHNIQUE: Contiguous axial images were obtained from the base of the skull through the vertex without intravenous contrast. RADIATION DOSE REDUCTION: This exam was performed according to the departmental dose-optimization program which includes automated exposure control, adjustment of the mA and/or kV according to patient size and/or use of iterative reconstruction  technique. COMPARISON:  10/03/2021 FINDINGS: Brain: No acute intracranial abnormality. Specifically, no hemorrhage, hydrocephalus, mass lesion, acute infarction, or significant intracranial injury. Vascular: No hyperdense vessel or unexpected calcification. Skull: No acute calvarial abnormality. Sinuses/Orbits: No acute findings Other: None IMPRESSION: Normal study for patient's age. Electronically Signed   By: Rolm Baptise M.D.   On: 01/03/2022 20:08   CT CHEST ABDOMEN PELVIS WO CONTRAST  Result Date: 01/03/2022 CLINICAL DATA:  Pneumonia, complication suspected, xray done. Shortness of breath. EXAM: CT CHEST, ABDOMEN AND PELVIS WITHOUT CONTRAST TECHNIQUE: Multidetector CT imaging of the chest, abdomen and pelvis was performed following the standard protocol without IV contrast. RADIATION DOSE REDUCTION: This exam was performed according to the departmental dose-optimization program which includes automated exposure control, adjustment of the mA and/or kV according to patient size and/or use of iterative reconstruction technique. COMPARISON:  None Available. FINDINGS: CT CHEST FINDINGS Cardiovascular: Heavily calcified aorta and coronary arteries. Prior CABG. Heart is normal size. Aorta is normal caliber. Mediastinum/Nodes: No mediastinal, hilar, or axillary adenopathy. Trachea and esophagus are unremarkable. Thyroid unremarkable. Lungs/Pleura: Lungs are clear. No focal airspace opacities or suspicious nodules. No effusions. Musculoskeletal: Chest wall soft tissues are unremarkable. No acute bony abnormality. CT ABDOMEN PELVIS FINDINGS Hepatobiliary: Small gallstone within the gallbladder. No focal hepatic abnormality. Pancreas: No focal abnormality or ductal dilatation. Spleen: No focal abnormality.  Normal size. Adrenals/Urinary Tract: Left kidney is atrophic. No renal or adrenal mass. No stones or hydronephrosis. Urinary bladder unremarkable. Stomach/Bowel: Left colonic diverticulosis. No active  diverticulitis. Stomach and small bowel decompressed, unremarkable. Vascular/Lymphatic: Heavily calcified aorta. No evidence of aneurysm or adenopathy. Reproductive: Calcified fibroids in the uterus.  No adnexal masses. Other: No free fluid or free air. Musculoskeletal: No acute bony abnormality. Scoliosis and degenerative changes in the lumbar spine. IMPRESSION: No acute cardiopulmonary disease. Diffuse aortoiliac atherosclerosis.  No aneurysm. Cholelithiasis. Left colonic diverticulosis.  No active diverticulitis. No acute findings in the abdomen or pelvis. Electronically Signed   By: Rolm Baptise M.D.   On: 01/03/2022 20:05   DG Chest 2 View  Result Date: 01/03/2022 CLINICAL DATA:  Shortness of breath EXAM: CHEST - 2 VIEW COMPARISON:  12/06/2021 FINDINGS: Prior CABG. Heart and mediastinal contours are within normal limits. No focal opacities or effusions. No acute bony abnormality. IMPRESSION: No active cardiopulmonary disease. Electronically Signed   By: Rolm Baptise M.D.   On: 01/03/2022 19:25    Microbiology: Results for orders placed or performed during the hospital encounter of 01/03/22  Culture, blood (Routine X 2) w Reflex to ID Panel     Status: None   Collection Time: 01/04/22  1:39 AM   Specimen: BLOOD  Result Value Ref Range Status   Specimen Description BLOOD LEFT FOREARM  Final   Special Requests   Final    BOTTLES DRAWN AEROBIC AND ANAEROBIC Blood Culture adequate volume   Culture   Final    NO GROWTH 5 DAYS Performed at [facility], [address], [place], [place] [address]    Report Status 01/09/2022 FINAL  Final  MRSA Next Gen by PCR, Nasal     Status: None   Collection Time: 01/04/22  2:57 AM   Specimen: Nasal Mucosa; Nasal Swab  Result Value Ref Range Status   MRSA by PCR Next Gen NOT DETECTED NOT DETECTED Final    Comment: (NOTE) The GeneXpert MRSA Assay (FDA approved for NASAL specimens only), is one component of a comprehensive MRSA colonization  surveillance program. It is not intended to diagnose MRSA  infection nor to guide or monitor treatment for MRSA infections. Test performance is not FDA approved in patients less than 43 years old. Performed at [facility], [address] [place], [place] [address]   Culture, blood (Routine X 2) w Reflex to ID Panel     Status: None   Collection Time: 01/04/22 11:30 PM   Specimen: BLOOD  Result Value Ref Range Status   Specimen Description BLOOD LEFT HAND  Final   Special Requests IN PEDIATRIC BOTTLE Blood Culture adequate volume  Final   Culture   Final    NO GROWTH 5 DAYS Performed at [facility], [address] [place], [place] [address]    Report Status 01/09/2022 FINAL  Final    Labs: CBC: Recent Labs  Lab 01/03/22 1826 01/04/22 0514 01/05/22 0644 01/06/22 0513 01/07/22 0438  WBC 8.1 10.3 11.1* 10.8* 10.2  HGB 8.9* 9.2* 9.6* 8.7* 8.5*  HCT 28.0* 27.9* 28.9* 26.7* 26.1*  MCV 97.2 95.2 95.1 96.7 97.0  PLT 239 193 218 169 156   Basic Metabolic Panel: Recent Labs  Lab 01/03/22 1826 01/03/22 2319 01/04/22 0514 01/04/22 1421 01/05/22 0644 01/06/22 0513 01/07/22 0438 01/08/22 0426 01/09/22 0504  NA 138   < > 136  --  136 137 132* 133* 132*  K 3.5   < > 2.9*   < > 4.1 3.9 4.1 4.1 3.4*  CL 104   < > 101  --  104 103 100 100 99  CO2 24   < > 25  --  24 25 25 24 25   GLUCOSE 153*   < > 145*  --  174* 141* 85 99 99  BUN 32*   < > 36*  --  34* 27* 39* 47* 44*  CREATININE 2.29*   < > 2.05*  --  1.49* 1.22* 1.33* 1.39* 1.18*  CALCIUM 8.2*   < > 8.3*  --  8.2* 8.4* 8.2* 8.4* 8.1*  MG 2.1  --  2.2  --   --  2.1 2.1  --   --   PHOS  --   --  4.9*  --   --  3.6 3.8  --   --    < > = values in this interval not displayed.   Liver Function Tests: Recent Labs  Lab 01/03/22 2319  AST 245*  ALT 146*  ALKPHOS 111  BILITOT 1.2  PROT 5.4*  ALBUMIN 3.4*   CBG: Recent Labs  Lab 01/08/22 1138 01/08/22 1551 01/08/22 1932 01/09/22 0723  01/09/22 1111  GLUCAP 104* 99 184* 105* 196*    Discharge time spent: less than 30 minutes.  Signed: 01/11/22, MD [facility] 01/09/2022

## 2022-01-09 NOTE — Progress Notes (Signed)
Discharge instructions, RX's and follow up appts explained and provided to patient verbalized understanding. Patient left floor via wheelchair accompanied by staff no c/o pain or shortness of breath at d/c.  Ellison Leisure Lynn, RN  

## 2022-01-09 NOTE — TOC Progression Note (Addendum)
Transition of Care [facility]) - Progression Note    Patient Details  Name: Lindsey Pollard MRN: [id number] Date of Birth: 1940/08/15  [facility]) CM/SW Contact  Margarito Liner, LCSW Phone Number: 01/09/2022, 11:38 AM  Clinical Narrative:   Updated patient regarding [facility] and start of care over the weekend. She is agreeable to does not want to try to find another agency that can take her sooner. Friend or daughter will transport her home at discharge.  12:21 pm: Daughter asked RN about getting a rollator. PT is aware and entered DME order. Ordered through [employer].  Expected Discharge Plan: Skilled Nursing Facility Barriers to Discharge: Continued Medical Work up  Expected Discharge Plan and Services Expected Discharge Plan: Skilled Nursing Facility       Living arrangements for the past 2 months: Single Family Home                                       Social Determinants of Health (SDOH) Interventions    Readmission Risk Interventions    01/05/2022    5:09 PM  Readmission Risk Prevention Plan  Transportation Screening Complete  PCP or Specialist Appt within 3-5 Days Complete  Social Work Consult for Recovery Care Planning/Counseling Not Complete  SW consult not completed comments NA  Palliative Care Screening Not Applicable  Medication Review Oceanographer) Referral to Pharmacy

## 2022-01-09 NOTE — Plan of Care (Signed)
  Problem: Education: Goal: Knowledge of General Education information will improve Description: Including pain rating scale, medication(s)/side effects and non-pharmacologic comfort measures Outcome: Adequate for Discharge   Problem: Health Behavior/Discharge Planning: Goal: Ability to manage health-related needs will improve Outcome: Adequate for Discharge   Problem: Clinical Measurements: Goal: Ability to maintain clinical measurements within normal limits will improve Outcome: Adequate for Discharge Goal: Will remain free from infection Outcome: Adequate for Discharge Goal: Diagnostic test results will improve Outcome: Adequate for Discharge Goal: Respiratory complications will improve Outcome: Adequate for Discharge Goal: Cardiovascular complication will be avoided Outcome: Adequate for Discharge   Problem: Activity: Goal: Risk for activity intolerance will decrease Outcome: Adequate for Discharge   Problem: Nutrition: Goal: Adequate nutrition will be maintained Outcome: Adequate for Discharge   Problem: Coping: Goal: Level of anxiety will decrease Outcome: Adequate for Discharge   Problem: Elimination: Goal: Will not experience complications related to bowel motility Outcome: Adequate for Discharge Goal: Will not experience complications related to urinary retention Outcome: Adequate for Discharge   Problem: Pain Managment: Goal: General experience of comfort will improve Outcome: Adequate for Discharge   Problem: Safety: Goal: Ability to remain free from injury will improve Outcome: Adequate for Discharge   Problem: Skin Integrity: Goal: Risk for impaired skin integrity will decrease Outcome: Adequate for Discharge   Problem: Education: Goal: Ability to describe self-care measures that may prevent or decrease complications (Diabetes Survival Skills Education) will improve Outcome: Adequate for Discharge Goal: Individualized Educational Video(s) Outcome:  Adequate for Discharge   Problem: Coping: Goal: Ability to adjust to condition or change in health will improve Outcome: Adequate for Discharge   Problem: Fluid Volume: Goal: Ability to maintain a balanced intake and output will improve Outcome: Adequate for Discharge   Problem: Health Behavior/Discharge Planning: Goal: Ability to identify and utilize available resources and services will improve Outcome: Adequate for Discharge Goal: Ability to manage health-related needs will improve Outcome: Adequate for Discharge   Problem: Metabolic: Goal: Ability to maintain appropriate glucose levels will improve Outcome: Adequate for Discharge   Problem: Nutritional: Goal: Maintenance of adequate nutrition will improve Outcome: Adequate for Discharge Goal: Progress toward achieving an optimal weight will improve Outcome: Adequate for Discharge   Problem: Skin Integrity: Goal: Risk for impaired skin integrity will decrease Outcome: Adequate for Discharge   Problem: Tissue Perfusion: Goal: Adequacy of tissue perfusion will improve Outcome: Adequate for Discharge   Problem: Malnutrition  (NI-5.2) Goal: Food and/or nutrient delivery Description: Individualized approach for food/nutrient provision. Outcome: Adequate for Discharge   Problem: Activity: Goal: Ability to tolerate increased activity will improve Outcome: Adequate for Discharge   Problem: Respiratory: Goal: Ability to maintain a clear airway and adequate ventilation will improve Outcome: Adequate for Discharge   Problem: Role Relationship: Goal: Method of communication will improve Outcome: Adequate for Discharge   Problem: Acute Rehab PT Goals(only PT should resolve) Goal: Pt Will Go Supine/Side To Sit Outcome: Adequate for Discharge Goal: Pt Will Go Sit To Supine/Side Outcome: Adequate for Discharge Goal: Pt Will Transfer Bed To Chair/Chair To Bed Outcome: Adequate for Discharge Goal: Pt Will Ambulate Outcome:  Adequate for Discharge   Problem: Acute Rehab OT Goals (only OT should resolve) Goal: Pt. Will Perform Grooming Outcome: Adequate for Discharge Goal: Pt. Will Perform Lower Body Dressing Outcome: Adequate for Discharge Goal: Pt. Will Transfer To Toilet Outcome: Adequate for Discharge Goal: Pt. Will Perform Toileting-Clothing Manipulation Outcome: Adequate for Discharge

## 2022-01-09 NOTE — Progress Notes (Signed)
Occupational Therapy Treatment Patient Details Name: Lindsey Pollard MRN: [id number] DOB: 16-Sep-1940 Today's Date: 01/09/2022   History of present illness Pt is an 81 y/o F admitted on 01/03/22 after presenting to the ED with c/o SOB & a fall 3 days ago, hitting the side of her head without LOC. Pt was placed on 2L via [place] but later c/o R sided chest pain & shortly after went into v-fib arrest without a pulse. CPR/ACLS initiated, pt was intubated for airway protection. Pt was extubated on 01/05/22. PMH: CAD s/p CABG, HTN, HLD, DM, PAD, CHF, CKD-3A, RLS   OT comments  Ms. Buehrer has demonstrated excellent progress since her initial hospitalization. During today's OT session, she is able to perform bed mobility, transfers, ambulation, LB dressing, grooming in standing, toileting, all with SUPV-Mod I. Good safety awareness throughout and no LOB. Provided educ re: rollator use, with pt verbalizing understanding. Have upgraded DC recs from SNF to [facility].    Recommendations for follow up therapy are one component of a multi-disciplinary discharge planning process, led by the attending physician.  Recommendations may be updated based on patient status, additional functional criteria and insurance authorization.    Follow Up Recommendations  Home health OT    Assistance Recommended at Discharge PRN  Patient can return home with the following  Assistance with cooking/housework   Equipment Recommendations       Recommendations for Other Services      Precautions / Restrictions Precautions Precautions: Fall Restrictions Weight Bearing Restrictions: No       Mobility Bed Mobility Overal bed mobility: Modified Independent Bed Mobility: Sit to Supine, Supine to Sit     Supine to sit: Modified independent (Device/Increase time)          Transfers Overall transfer level: Modified independent Equipment used: Rolling walker (2 wheels) Transfers: Sit to/from Stand Sit to Stand: Supervision            General transfer comment: once standing, safe technique with RW.     Balance Overall balance assessment: Modified Independent Sitting-balance support: Feet supported Sitting balance-Leahy Scale: Good     Standing balance support: Bilateral upper extremity supported, During functional activity Standing balance-Leahy Scale: Good                             ADL either performed or assessed with clinical judgement   ADL Overall ADL's : Needs assistance/impaired     Grooming: Wash/dry hands;Wash/dry face;Standing;Supervision/safety               Lower Body Dressing: Modified independent;Sitting/lateral leans Lower Body Dressing Details (indicate cue type and reason): doffing/donning socks Toilet Transfer: Retail banker;Ambulation;Rolling walker (2 wheels)                  Extremity/Trunk Assessment Upper Extremity Assessment Upper Extremity Assessment: Generalized weakness   Lower Extremity Assessment Lower Extremity Assessment: Generalized weakness        Vision       Perception     Praxis      Cognition Arousal/Alertness: Awake/alert Behavior During Therapy: WFL for tasks assessed/performed Overall Cognitive Status: Within Functional Limits for tasks assessed                                 General Comments: pleasant, A&O        Exercises Other Exercises Other Exercises: Educ re: safe  use of new DME equipment in room    Shoulder Instructions       General Comments      Pertinent Vitals/ Pain       Pain Assessment Pain Assessment: No/denies pain  Home Living                                          Prior Functioning/Environment              Frequency  Min 2X/week        Progress Toward Goals  OT Goals(current goals can now be found in the care plan section)  Progress towards OT goals: Progressing toward goals  Acute Rehab OT Goals OT Goal  Formulation: With patient Time For Goal Achievement: 01/20/22 Potential to Achieve Goals: Good  Plan Discharge plan needs to be updated;Frequency remains appropriate    Co-evaluation                 AM-PAC OT "6 Clicks" Daily Activity     Outcome Measure   Help from another person eating meals?: None Help from another person taking care of personal grooming?: None Help from another person toileting, which includes using toliet, bedpan, or urinal?: A Little Help from another person bathing (including washing, rinsing, drying)?: A Little Help from another person to put on and taking off regular upper body clothing?: None Help from another person to put on and taking off regular lower body clothing?: None 6 Click Score: 22    End of Session Equipment Utilized During Treatment: Rolling walker (2 wheels);Rollator (4 wheels)  OT Visit Diagnosis: Unsteadiness on feet (R26.81);Muscle weakness (generalized) (M62.81)   Activity Tolerance Patient tolerated treatment well   Patient Left in bed;with call bell/phone within reach;with bed alarm set   Nurse Communication          Time: 9470-9628 OT Time Calculation (min): 9 min  Charges: OT General Charges $OT Visit: 1 Visit  Latina Craver, PhD, MS, OTR/L 01/09/22, 2:55 PM

## 2022-01-11 ENCOUNTER — Other Ambulatory Visit: Payer: Self-pay

## 2022-01-11 ENCOUNTER — Emergency Department: Payer: Medicare Other

## 2022-01-11 ENCOUNTER — Inpatient Hospital Stay
Admission: EM | Admit: 2022-01-11 | Discharge: 2022-01-23 | DRG: 870 | Disposition: A | Discharge status: Skilled Nursing Facility | Payer: Medicare Other | Attending: Osteopathic Medicine | Admitting: Osteopathic Medicine

## 2022-01-11 ENCOUNTER — Inpatient Hospital Stay: Payer: Medicare Other

## 2022-01-11 DIAGNOSIS — J9601 Acute respiratory failure with hypoxia: Secondary | ICD-10-CM | POA: Diagnosis present

## 2022-01-11 DIAGNOSIS — I700 Atherosclerosis of aorta: Secondary | ICD-10-CM | POA: Diagnosis not present

## 2022-01-11 DIAGNOSIS — E440 Moderate protein-calorie malnutrition: Secondary | ICD-10-CM | POA: Diagnosis present

## 2022-01-11 DIAGNOSIS — I25110 Atherosclerotic heart disease of native coronary artery with unstable angina pectoris: Secondary | ICD-10-CM | POA: Diagnosis present

## 2022-01-11 DIAGNOSIS — Z9861 Coronary angioplasty status: Secondary | ICD-10-CM | POA: Diagnosis not present

## 2022-01-11 DIAGNOSIS — G9341 Metabolic encephalopathy: Secondary | ICD-10-CM | POA: Diagnosis present

## 2022-01-11 DIAGNOSIS — J690 Pneumonitis due to inhalation of food and vomit: Secondary | ICD-10-CM | POA: Diagnosis present

## 2022-01-11 DIAGNOSIS — Z66 Do not resuscitate: Secondary | ICD-10-CM | POA: Diagnosis not present

## 2022-01-11 DIAGNOSIS — N179 Acute kidney failure, unspecified: Secondary | ICD-10-CM | POA: Diagnosis present

## 2022-01-11 DIAGNOSIS — E876 Hypokalemia: Secondary | ICD-10-CM | POA: Diagnosis present

## 2022-01-11 DIAGNOSIS — F419 Anxiety disorder, unspecified: Secondary | ICD-10-CM | POA: Diagnosis not present

## 2022-01-11 DIAGNOSIS — B9620 Unspecified Escherichia coli [E. coli] as the cause of diseases classified elsewhere: Secondary | ICD-10-CM | POA: Diagnosis present

## 2022-01-11 DIAGNOSIS — I5033 Acute on chronic diastolic (congestive) heart failure: Secondary | ICD-10-CM | POA: Diagnosis present

## 2022-01-11 DIAGNOSIS — R509 Fever, unspecified: Secondary | ICD-10-CM

## 2022-01-11 DIAGNOSIS — J9602 Acute respiratory failure with hypercapnia: Secondary | ICD-10-CM | POA: Diagnosis present

## 2022-01-11 DIAGNOSIS — Z7189 Other specified counseling: Secondary | ICD-10-CM | POA: Diagnosis not present

## 2022-01-11 DIAGNOSIS — N1831 Chronic kidney disease, stage 3a: Secondary | ICD-10-CM | POA: Diagnosis present

## 2022-01-11 DIAGNOSIS — Z794 Long term (current) use of insulin: Secondary | ICD-10-CM

## 2022-01-11 DIAGNOSIS — N390 Urinary tract infection, site not specified: Secondary | ICD-10-CM | POA: Diagnosis present

## 2022-01-11 DIAGNOSIS — H9193 Unspecified hearing loss, bilateral: Secondary | ICD-10-CM | POA: Diagnosis present

## 2022-01-11 DIAGNOSIS — Z20822 Contact with and (suspected) exposure to covid-19: Secondary | ICD-10-CM | POA: Diagnosis present

## 2022-01-11 DIAGNOSIS — Z515 Encounter for palliative care: Secondary | ICD-10-CM

## 2022-01-11 DIAGNOSIS — I130 Hypertensive heart and chronic kidney disease with heart failure and stage 1 through stage 4 chronic kidney disease, or unspecified chronic kidney disease: Secondary | ICD-10-CM | POA: Diagnosis present

## 2022-01-11 DIAGNOSIS — Z833 Family history of diabetes mellitus: Secondary | ICD-10-CM

## 2022-01-11 DIAGNOSIS — R579 Shock, unspecified: Secondary | ICD-10-CM | POA: Diagnosis not present

## 2022-01-11 DIAGNOSIS — I25710 Atherosclerosis of autologous vein coronary artery bypass graft(s) with unstable angina pectoris: Secondary | ICD-10-CM | POA: Diagnosis present

## 2022-01-11 DIAGNOSIS — Z8674 Personal history of sudden cardiac arrest: Secondary | ICD-10-CM

## 2022-01-11 DIAGNOSIS — I447 Left bundle-branch block, unspecified: Secondary | ICD-10-CM | POA: Diagnosis present

## 2022-01-11 DIAGNOSIS — A4151 Sepsis due to Escherichia coli [E. coli]: Secondary | ICD-10-CM | POA: Diagnosis present

## 2022-01-11 DIAGNOSIS — Z7902 Long term (current) use of antithrombotics/antiplatelets: Secondary | ICD-10-CM

## 2022-01-11 DIAGNOSIS — I255 Ischemic cardiomyopathy: Secondary | ICD-10-CM | POA: Diagnosis present

## 2022-01-11 DIAGNOSIS — Z79899 Other long term (current) drug therapy: Secondary | ICD-10-CM

## 2022-01-11 DIAGNOSIS — Z751 Person awaiting admission to adequate facility elsewhere: Secondary | ICD-10-CM

## 2022-01-11 DIAGNOSIS — Z6825 Body mass index (BMI) 25.0-25.9, adult: Secondary | ICD-10-CM

## 2022-01-11 DIAGNOSIS — Z8249 Family history of ischemic heart disease and other diseases of the circulatory system: Secondary | ICD-10-CM | POA: Diagnosis not present

## 2022-01-11 DIAGNOSIS — Z887 Allergy status to serum and vaccine status: Secondary | ICD-10-CM

## 2022-01-11 DIAGNOSIS — R54 Age-related physical debility: Secondary | ICD-10-CM | POA: Diagnosis not present

## 2022-01-11 DIAGNOSIS — R570 Cardiogenic shock: Secondary | ICD-10-CM | POA: Diagnosis present

## 2022-01-11 DIAGNOSIS — Z7982 Long term (current) use of aspirin: Secondary | ICD-10-CM

## 2022-01-11 DIAGNOSIS — I214 Non-ST elevation (NSTEMI) myocardial infarction: Secondary | ICD-10-CM

## 2022-01-11 DIAGNOSIS — Z87891 Personal history of nicotine dependence: Secondary | ICD-10-CM

## 2022-01-11 DIAGNOSIS — Z951 Presence of aortocoronary bypass graft: Secondary | ICD-10-CM

## 2022-01-11 DIAGNOSIS — E785 Hyperlipidemia, unspecified: Secondary | ICD-10-CM | POA: Diagnosis present

## 2022-01-11 DIAGNOSIS — J9621 Acute and chronic respiratory failure with hypoxia: Secondary | ICD-10-CM | POA: Diagnosis present

## 2022-01-11 DIAGNOSIS — Z880 Allergy status to penicillin: Secondary | ICD-10-CM

## 2022-01-11 DIAGNOSIS — I200 Unstable angina: Secondary | ICD-10-CM | POA: Diagnosis present

## 2022-01-11 DIAGNOSIS — R4182 Altered mental status, unspecified: Secondary | ICD-10-CM | POA: Diagnosis present

## 2022-01-11 LAB — COMPREHENSIVE METABOLIC PANEL
ALT: 33 U/L (ref 0–44)
AST: 28 U/L (ref 15–41)
Albumin: 3.3 g/dL — ABNORMAL LOW (ref 3.5–5.0)
Alkaline Phosphatase: 66 U/L (ref 38–126)
Anion gap: 9 (ref 5–15)
BUN: 48 mg/dL — ABNORMAL HIGH (ref 8–23)
CO2: 26 mmol/L (ref 22–32)
Calcium: 8.8 mg/dL — ABNORMAL LOW (ref 8.9–10.3)
Chloride: 99 mmol/L (ref 98–111)
Creatinine, Ser: 1.57 mg/dL — ABNORMAL HIGH (ref 0.44–1.00)
GFR, Estimated: 33 mL/min — ABNORMAL LOW (ref >60)
Glucose, Bld: 169 mg/dL — ABNORMAL HIGH (ref 70–99)
Potassium: 3 mmol/L — ABNORMAL LOW (ref 3.5–5.1)
Sodium: 134 mmol/L — ABNORMAL LOW (ref 135–145)
Total Bilirubin: 0.7 mg/dL (ref 0.3–1.2)
Total Protein: 6.5 g/dL (ref 6.5–8.1)

## 2022-01-11 LAB — BLOOD GAS, ARTERIAL
Acid-base deficit: 1.3 mmol/L (ref 0.0–2.0)
Bicarbonate: 24.8 mmol/L (ref 20.0–28.0)
Drawn by: 51910
FIO2: 30 %
MECHVT: 350 mL
Mechanical Rate: 16
O2 Saturation: 99.9 %
PEEP: 5 cmH2O
Patient temperature: 37
pCO2 arterial: 46 mmHg (ref 32–48)
pH, Arterial: 7.34 — ABNORMAL LOW (ref 7.35–7.45)
pO2, Arterial: 126 mmHg — ABNORMAL HIGH (ref 83–108)

## 2022-01-11 LAB — URINALYSIS, ROUTINE W REFLEX MICROSCOPIC
Bilirubin Urine: NEGATIVE
Glucose, UA: NEGATIVE mg/dL
Hgb urine dipstick: NEGATIVE
Ketones, ur: NEGATIVE mg/dL
Nitrite: NEGATIVE
Protein, ur: NEGATIVE mg/dL
Specific Gravity, Urine: 1.02 (ref 1.005–1.030)
WBC, UA: >50 WBC/hpf — ABNORMAL HIGH (ref 0–5)
pH: 5 (ref 5.0–8.0)

## 2022-01-11 LAB — GLUCOSE, CAPILLARY
Glucose-Capillary: 132 mg/dL — ABNORMAL HIGH (ref 70–99)
Glucose-Capillary: 140 mg/dL — ABNORMAL HIGH (ref 70–99)
Glucose-Capillary: 167 mg/dL — ABNORMAL HIGH (ref 70–99)
Glucose-Capillary: 172 mg/dL — ABNORMAL HIGH (ref 70–99)
Glucose-Capillary: 182 mg/dL — ABNORMAL HIGH (ref 70–99)

## 2022-01-11 LAB — URINE DRUG SCREEN, QUALITATIVE (ARMC ONLY)
Amphetamines, Ur Screen: NOT DETECTED
Barbiturates, Ur Screen: NOT DETECTED
Benzodiazepine, Ur Scrn: NOT DETECTED
Cannabinoid 50 Ng, Ur ~~LOC~~: NOT DETECTED
Cocaine Metabolite,Ur ~~LOC~~: NOT DETECTED
MDMA (Ecstasy)Ur Screen: NOT DETECTED
Methadone Scn, Ur: NOT DETECTED
Opiate, Ur Screen: NOT DETECTED
Phencyclidine (PCP) Ur S: NOT DETECTED
Tricyclic, Ur Screen: NOT DETECTED

## 2022-01-11 LAB — LACTIC ACID, PLASMA
Lactic Acid, Venous: 1.4 mmol/L (ref 0.5–1.9)
Lactic Acid, Venous: 2.3 mmol/L (ref 0.5–1.9)

## 2022-01-11 LAB — TROPONIN I (HIGH SENSITIVITY)
Troponin I (High Sensitivity): 2206 ng/L (ref <18)
Troponin I (High Sensitivity): 1739 ng/L (ref <18)

## 2022-01-11 LAB — CBC WITH DIFFERENTIAL/PLATELET
Abs Immature Granulocytes: 0.04 10*3/uL (ref 0.00–0.07)
Basophils Absolute: 0 10*3/uL (ref 0.0–0.1)
Basophils Relative: 1 %
Eosinophils Absolute: 0.1 10*3/uL (ref 0.0–0.5)
Eosinophils Relative: 2 %
HCT: 27.8 % — ABNORMAL LOW (ref 36.0–46.0)
Hemoglobin: 8.9 g/dL — ABNORMAL LOW (ref 12.0–15.0)
Immature Granulocytes: 1 %
Lymphocytes Relative: 19 %
Lymphs Abs: 1.1 10*3/uL (ref 0.7–4.0)
MCH: 30.8 pg (ref 26.0–34.0)
MCHC: 32 g/dL (ref 30.0–36.0)
MCV: 96.2 fL (ref 80.0–100.0)
Monocytes Absolute: 0.7 10*3/uL (ref 0.1–1.0)
Monocytes Relative: 12 %
Neutro Abs: 3.7 10*3/uL (ref 1.7–7.7)
Neutrophils Relative %: 65 %
Platelets: 364 10*3/uL (ref 150–400)
RBC: 2.89 MIL/uL — ABNORMAL LOW (ref 3.87–5.11)
RDW: 13.7 % (ref 11.5–15.5)
WBC: 5.7 10*3/uL (ref 4.0–10.5)
nRBC: 0 % (ref 0.0–0.2)

## 2022-01-11 LAB — PROCALCITONIN: Procalcitonin: 0.46 ng/mL

## 2022-01-11 LAB — POTASSIUM: Potassium: 3.6 mmol/L (ref 3.5–5.1)

## 2022-01-11 LAB — CBG MONITORING, ED: Glucose-Capillary: 182 mg/dL — ABNORMAL HIGH (ref 70–99)

## 2022-01-11 LAB — PROTIME-INR
INR: 1 (ref 0.8–1.2)
INR: 1.1 (ref 0.8–1.2)
Prothrombin Time: 13.4 seconds (ref 11.4–15.2)
Prothrombin Time: 13.9 seconds (ref 11.4–15.2)

## 2022-01-11 LAB — MAGNESIUM: Magnesium: 2.4 mg/dL (ref 1.7–2.4)

## 2022-01-11 LAB — SARS CORONAVIRUS 2 BY RT PCR: SARS Coronavirus 2 by RT PCR: NEGATIVE

## 2022-01-11 LAB — MRSA NEXT GEN BY PCR, NASAL: MRSA by PCR Next Gen: NOT DETECTED

## 2022-01-11 LAB — APTT: aPTT: 32 seconds (ref 24–36)

## 2022-01-11 LAB — BRAIN NATRIURETIC PEPTIDE: B Natriuretic Peptide: 778.3 pg/mL — ABNORMAL HIGH (ref 0.0–100.0)

## 2022-01-11 MED ORDER — MIDAZOLAM HCL 2 MG/2ML IJ SOLN: 2.0000 mg | INTRAMUSCULAR | Status: DC | PRN

## 2022-01-11 MED ORDER — ASPIRIN 325 MG PO TABS
325.0000 mg | ORAL_TABLET | Freq: Once | ORAL | Status: AC
  Administered 2022-01-11: 325 mg
  Filled 2022-01-11: qty 1

## 2022-01-11 MED ORDER — MIDAZOLAM-SODIUM CHLORIDE 100-0.9 MG/100ML-% IV SOLN
0.5000 mg/h | INTRAVENOUS | Status: DC
  Administered 2022-01-11: 0.5 mg/h via INTRAVENOUS

## 2022-01-11 MED ORDER — NOREPINEPHRINE 4 MG/250ML-% IV SOLN: 0.0000 ug/min | INTRAVENOUS | Status: DC

## 2022-01-11 MED ORDER — PIPERACILLIN-TAZOBACTAM 3.375 G IVPB
3.3750 g | Freq: Three times a day (TID) | INTRAVENOUS | Status: AC
Start: 2022-01-11 — End: 2022-01-16
  Administered 2022-01-11 – 2022-01-15 (×14): 3.375 g via INTRAVENOUS
  Filled 2022-01-11 (×14): qty 50

## 2022-01-11 MED ORDER — DOCUSATE SODIUM 50 MG/5ML PO LIQD: 100.0000 mg | Freq: Two times a day (BID) | ORAL | Status: DC | PRN

## 2022-01-11 MED ORDER — HEPARIN (PORCINE) 25000 UT/250ML-% IV SOLN
800.0000 [IU]/h | INTRAVENOUS | Status: AC
  Administered 2022-01-11 – 2022-01-12 (×2): 700 [IU]/h via INTRAVENOUS
  Filled 2022-01-11 (×2): qty 250

## 2022-01-11 MED ORDER — FENTANYL BOLUS VIA INFUSION
25.0000 ug | INTRAVENOUS | Status: DC | PRN
  Administered 2022-01-11: 50 ug via INTRAVENOUS
  Administered 2022-01-14: 25 ug via INTRAVENOUS
  Administered 2022-01-14: 75 ug via INTRAVENOUS
  Administered 2022-01-14: 50 ug via INTRAVENOUS
  Administered 2022-01-14: 25 ug via INTRAVENOUS
  Administered 2022-01-15: 100 ug via INTRAVENOUS

## 2022-01-11 MED ORDER — HEPARIN BOLUS VIA INFUSION
3500.0000 [IU] | Freq: Once | INTRAVENOUS | Status: AC
  Administered 2022-01-11: 3500 [IU] via INTRAVENOUS
  Filled 2022-01-11: qty 3500

## 2022-01-11 MED ORDER — CLOPIDOGREL BISULFATE 75 MG PO TABS
75.0000 mg | ORAL_TABLET | Freq: Every day | ORAL | Status: DC
  Administered 2022-01-11 – 2022-01-16 (×6): 75 mg
  Filled 2022-01-11 (×6): qty 1

## 2022-01-11 MED ORDER — POLYETHYLENE GLYCOL 3350 17 G PO PACK
17.0000 g | PACK | Freq: Every day | ORAL | Status: DC
  Administered 2022-01-11 – 2022-01-14 (×4): 17 g
  Filled 2022-01-11 (×5): qty 1

## 2022-01-11 MED ORDER — ASPIRIN 81 MG PO CHEW
81.0000 mg | CHEWABLE_TABLET | Freq: Every day | ORAL | Status: DC
  Administered 2022-01-12 – 2022-01-16 (×5): 81 mg
  Filled 2022-01-11 (×5): qty 1

## 2022-01-11 MED ORDER — POLYETHYLENE GLYCOL 3350 17 G PO PACK: 17.0000 g | PACK | Freq: Every day | ORAL | Status: DC | PRN

## 2022-01-11 MED ORDER — ROCURONIUM BROMIDE 10 MG/ML (PF) SYRINGE
PREFILLED_SYRINGE | INTRAVENOUS | Status: AC
  Filled 2022-01-11: qty 10

## 2022-01-11 MED ORDER — NOREPINEPHRINE 4 MG/250ML-% IV SOLN
INTRAVENOUS | Status: AC
  Administered 2022-01-11: 5 ug/min via INTRAVENOUS
  Filled 2022-01-11: qty 250

## 2022-01-11 MED ORDER — NOREPINEPHRINE 4 MG/250ML-% IV SOLN
0.0000 ug/min | INTRAVENOUS | Status: DC
  Administered 2022-01-11: 16 ug/min via INTRAVENOUS
  Administered 2022-01-11: 2 ug/min via INTRAVENOUS
  Filled 2022-01-11 (×2): qty 250

## 2022-01-11 MED ORDER — INSULIN ASPART 100 UNIT/ML IJ SOLN
0.0000 [IU] | INTRAMUSCULAR | Status: DC
  Administered 2022-01-11: 3 [IU] via SUBCUTANEOUS
  Administered 2022-01-11: 2 [IU] via SUBCUTANEOUS
  Administered 2022-01-11: 3 [IU] via SUBCUTANEOUS
  Administered 2022-01-11 – 2022-01-12 (×3): 2 [IU] via SUBCUTANEOUS
  Administered 2022-01-12 (×2): 3 [IU] via SUBCUTANEOUS
  Administered 2022-01-12: 2 [IU] via SUBCUTANEOUS
  Administered 2022-01-13 (×2): 3 [IU] via SUBCUTANEOUS
  Administered 2022-01-13: 2 [IU] via SUBCUTANEOUS
  Administered 2022-01-13 – 2022-01-15 (×9): 3 [IU] via SUBCUTANEOUS
  Administered 2022-01-15: 2 [IU] via SUBCUTANEOUS
  Administered 2022-01-15: 5 [IU] via SUBCUTANEOUS
  Administered 2022-01-15 – 2022-01-16 (×4): 3 [IU] via SUBCUTANEOUS
  Administered 2022-01-16: 2 [IU] via SUBCUTANEOUS
  Administered 2022-01-17: 3 [IU] via SUBCUTANEOUS
  Administered 2022-01-17: 2 [IU] via SUBCUTANEOUS
  Administered 2022-01-17 – 2022-01-18 (×2): 3 [IU] via SUBCUTANEOUS
  Administered 2022-01-18 – 2022-01-20 (×7): 2 [IU] via SUBCUTANEOUS
  Filled 2022-01-11 (×39): qty 1

## 2022-01-11 MED ORDER — FENTANYL 2500MCG IN NS 250ML (10MCG/ML) PREMIX INFUSION
0.0000 ug/h | INTRAVENOUS | Status: DC
  Administered 2022-01-11: 25 ug/h via INTRAVENOUS
  Administered 2022-01-12 – 2022-01-13 (×2): 75 ug/h via INTRAVENOUS
  Administered 2022-01-15: 200 ug/h via INTRAVENOUS
  Filled 2022-01-11 (×3): qty 250

## 2022-01-11 MED ORDER — FENTANYL CITRATE PF 50 MCG/ML IJ SOSY
25.0000 ug | PREFILLED_SYRINGE | INTRAMUSCULAR | Status: DC | PRN
  Administered 2022-01-16: 50 ug via INTRAVENOUS
  Filled 2022-01-11: qty 1

## 2022-01-11 MED ORDER — ONDANSETRON HCL 4 MG/2ML IJ SOLN
INTRAMUSCULAR | Status: AC
  Filled 2022-01-11: qty 2

## 2022-01-11 MED ORDER — CHLORHEXIDINE GLUCONATE CLOTH 2 % EX PADS
6.0000 | MEDICATED_PAD | Freq: Every day | CUTANEOUS | Status: DC
Start: 2022-01-11 — End: 2022-01-22
  Administered 2022-01-11 – 2022-01-21 (×9): 6 via TOPICAL

## 2022-01-11 MED ORDER — VASOPRESSIN 20 UNITS/100 ML INFUSION FOR SHOCK
0.0000 [IU]/min | INTRAVENOUS | Status: DC
  Administered 2022-01-11: 0.03 [IU]/min via INTRAVENOUS
  Administered 2022-01-12: 0.02 [IU]/min via INTRAVENOUS
  Administered 2022-01-12: 0.03 [IU]/min via INTRAVENOUS
  Filled 2022-01-11 (×2): qty 100

## 2022-01-11 MED ORDER — DOPAMINE-DEXTROSE 3.2-5 MG/ML-% IV SOLN: 0.0000 ug/kg/min | INTRAVENOUS | Status: DC

## 2022-01-11 MED ORDER — SODIUM CHLORIDE 0.9 % IV SOLN
250.0000 mL | INTRAVENOUS | Status: DC
  Administered 2022-01-12 – 2022-01-14 (×2): 250 mL via INTRAVENOUS

## 2022-01-11 MED ORDER — DOCUSATE SODIUM 50 MG/5ML PO LIQD
100.0000 mg | Freq: Two times a day (BID) | ORAL | Status: DC
  Administered 2022-01-11 – 2022-01-16 (×8): 100 mg
  Filled 2022-01-11 (×9): qty 10

## 2022-01-11 MED ORDER — PANTOPRAZOLE SODIUM 40 MG IV SOLR
40.0000 mg | Freq: Every day | INTRAVENOUS | Status: DC
  Administered 2022-01-11: 40 mg via INTRAVENOUS
  Filled 2022-01-11: qty 10

## 2022-01-11 MED ORDER — FENTANYL 2500MCG IN NS 250ML (10MCG/ML) PREMIX INFUSION
INTRAVENOUS | Status: AC
  Filled 2022-01-11: qty 250

## 2022-01-11 MED ORDER — MIDAZOLAM-SODIUM CHLORIDE 100-0.9 MG/100ML-% IV SOLN
INTRAVENOUS | Status: AC
  Filled 2022-01-11: qty 100

## 2022-01-11 MED ORDER — POTASSIUM CHLORIDE 10 MEQ/100ML IV SOLN
10.0000 meq | INTRAVENOUS | Status: AC
  Administered 2022-01-11 (×2): 10 meq via INTRAVENOUS
  Filled 2022-01-11 (×2): qty 100

## 2022-01-11 MED ORDER — CEFTRIAXONE SODIUM 1 G IJ SOLR
1.0000 g | INTRAMUSCULAR | Status: DC
Start: 2022-01-11 — End: 2022-01-11

## 2022-01-11 MED ORDER — ORAL CARE MOUTH RINSE: 15.0000 mL | OROMUCOSAL | Status: DC | PRN

## 2022-01-11 MED ORDER — ONDANSETRON HCL 4 MG/2ML IJ SOLN
4.0000 mg | Freq: Four times a day (QID) | INTRAMUSCULAR | Status: DC | PRN
  Filled 2022-01-11: qty 2

## 2022-01-11 MED ORDER — ACETAMINOPHEN 325 MG PO TABS: 650.0000 mg | ORAL_TABLET | ORAL | Status: DC | PRN

## 2022-01-11 MED ORDER — ETOMIDATE 2 MG/ML IV SOLN
INTRAVENOUS | Status: AC
  Filled 2022-01-11: qty 10

## 2022-01-11 MED ORDER — ORAL CARE MOUTH RINSE
15.0000 mL | OROMUCOSAL | Status: DC
  Administered 2022-01-12 – 2022-01-16 (×49): 15 mL via OROMUCOSAL

## 2022-01-11 NOTE — ED Triage Notes (Signed)
EMS called out to house for n/v. On arrival pt found to be c/o n/a and SOB. Pt lethargic and difficult to arouse both en route and in department. Pt breathing labored. Pt unable to answer questions. BGL 182 on arrival.

## 2022-01-11 NOTE — Consult Note (Signed)
ANTICOAGULATION CONSULT NOTE  Pharmacy Consult for heparin infusion Indication: chest pain/ACS  Allergies  Allergen Reactions   Tetanus Toxoids Swelling    Tetanus and diphtheria toxids   Penicillin G Rash    Patient Measurements: Height: 4\' 11"  (149.9 cm) Weight: 63.7 kg (140 lb 8 oz) IBW/kg (Calculated) : 43.2 Heparin Dosing Weight: 56.9 kg  Vital Signs: Temp: 96.4 F (35.8 C) (07/20 0855) Temp Source: Oral (07/20 0701) BP: 117/53 (07/20 0855) Pulse Rate: 70 (07/20 0855)  Labs: Recent Labs    01/09/22 0504 01/11/22 0719 01/11/22 0840  HGB  --  8.9*  --   HCT  --  27.8*  --   PLT  --  364  --   APTT  --   --  32  LABPROT  --  13.4 13.9  INR  --  1.0 1.1  CREATININE 1.18* 1.57*  --   TROPONINIHS 6,335* 2,206*  --     Estimated Creatinine Clearance: 23.2 mL/min (A) (by C-G formula based on SCr of 1.57 mg/dL (H)).   Medical History: Past Medical History:  Diagnosis Date   Chest pain 12/06/2021   Diabetes mellitus without complication (HCC)    Hyperlipidemia    Hypertension    Myocardial infarction (HCC)     Medications:  No history of chronic anticoagulant use PTA  Assessment: Pharmacy has been consulted for heparin infusion in 81yo patient presenting to the ED with AMS, nausea, and shortness of breath. He was recently discharged from hospital after V-fib arrest(brief ICU stay). Also has history of CAD s/p CABG. Baseline labs have been ordered and are pending.  Goal of Therapy:  Heparin level 0.3-0.7 units/ml Monitor platelets by anticoagulation protocol: Yes   Plan:  Give 3500 units bolus x 1 Start heparin infusion at 700 units/hr Check anti-Xa level in 8 hours and daily while on heparin Continue to monitor H&H and platelets  Jermari Tamargo A Patrich Heinze 01/11/2022,9:26 AM

## 2022-01-11 NOTE — Progress Notes (Signed)
Initial Nutrition Assessment  DOCUMENTATION CODES:   Non-severe (moderate) malnutrition in context of chronic illness  INTERVENTION:   Once appropriate for tube feeds, recommend:  Vital 1.2@50ml /hr- Initiate at 74ml/hr and increase by 46ml/hr q 8 hours until goal rate is reached.   Free water flushes 56ml q4 hours to maintain tube patency   Regimen provides 1440kcal/day, 90g/day protein and 1145ml/day of free water  Pt at high refeed risk; recommend monitor potassium, magnesium and phosphorus labs daily until stable  NUTRITION DIAGNOSIS:   Moderate Malnutrition related to chronic illness as evidenced by moderate muscle depletion, moderate fat depletion.  GOAL:   Provide needs based on ASPEN/SCCM guidelines  MONITOR:   Vent status, Labs, Weight trends, Skin, I & O's, TF tolerance  REASON FOR ASSESSMENT:   Ventilator    ASSESSMENT:   81 y/o female with h/o CAD s/p CABG x 2, PVD, GERD, CKD III, MI, DM, CHF, HTN, HLD and recent admission for VFib cardiac arrest and who is now admitted with acute metabolic encephalopathy, acute respiratory failure in the setting of acute on chronic HFpEF, elevated troponins (NSTEMI versus demand ischemia), UTI and presumed aspiration requiring intubation for airway protection.  Pt sedated and ventilated. OGT in place to LIS with 41ml output. Per chart review, pt with nausea and vomiting pta. KUB with NSF. Will re-evaluate tomorrow for tube feed initiation. Pt is likely at refeed risk. Per chart, pt up ~20lbs from her last documented weight; RD unsure if pt has had any significant recent weight loss.   Medications reviewed and include: aspirin, plavix, colace, insulin, protonix, miralax, heparin, levophed, zosyn  Labs reviewed: Na 134(L), K 3.0(L), BUN 48(H), creat 1.57(H), Mg 2.4 wnl Hgb 8.9(L), Hct 27.8(L) Cbgs- 182, 172, 182 x 24 hrs AIC 6.8(H)- 6/14  Patient is currently intubated on ventilator support MV: 10 L/min Temp (24hrs),  Avg:96.6 F (35.9 C), Min:95.9 F (35.5 C), Max:97.5 F (36.4 C)  Propofol: none   MAP- >66mmHg   UOP- 65ml   NUTRITION - FOCUSED PHYSICAL EXAM:  Flowsheet Row Most Recent Value  Orbital Region Mild depletion  Upper Arm Region Moderate depletion  Thoracic and Lumbar Region No depletion  Buccal Region No depletion  Temple Region Mild depletion  Clavicle Bone Region Moderate depletion  Clavicle and Acromion Bone Region Moderate depletion  Scapular Bone Region Moderate depletion  Dorsal Hand Unable to assess  Patellar Region Moderate depletion  Anterior Thigh Region Moderate depletion  Posterior Calf Region Moderate depletion  Edema (RD Assessment) Mild  Hair Reviewed  Eyes Reviewed  Mouth Reviewed  Skin Reviewed  Nails Reviewed   Diet Order:   Diet Order             Diet NPO time specified  Diet effective now                  EDUCATION NEEDS:   No education needs have been identified at this time  Skin:  Skin Assessment: Reviewed RN Assessment (ecchymosis)  Last BM:  pta  Height:   Ht Readings from Last 1 Encounters:  01/11/22 4\' 11"  (1.499 m)    Weight:   Wt Readings from Last 1 Encounters:  01/11/22 63.7 kg    Ideal Body Weight:  44.5 kg  BMI:  Body mass index is 28.38 kg/m.  Estimated Nutritional Needs:   Kcal:  1154kcal/day  Protein:  80-90g/day  Fluid:  1.2-1.4L/day  01/13/22 MS, RD, LDN Please refer to [facility] for RD and/or RD on-call/weekend/after  hours pager

## 2022-01-11 NOTE — IPAL (Signed)
  Interdisciplinary Goals of Care Family Meeting   Date carried out: 01/11/2022  Location of the meeting: Phone conference  Member's involved: Physician, Nurse Practitioner, and Family Member or next of kin       GOALS OF CARE DISCUSSION  The Clinical status was relayed to family in detail- Daughter [name]  Updated and notified of patients medical condition- Patient with increased WOB and using accessory muscles to breathe Explained to family course of therapy and the modalities   Patient with Progressive multiorgan failure with a very high probablity of a very minimal chance of meaningful recovery despite all aggressive and optimal medical therapy.    Family understands the situation.  They have consented and agreed to DNR STATUS NO CPR, NO ELECTROCUTION   Family are satisfied with Plan of action and management. All questions answered  Additional CC time 25 mins   Monaca Wadas Santiago Glad, M.D.  [facility]  Medical Director [facility] Medical Director [facility] Cardio-Pulmonary Department

## 2022-01-11 NOTE — Consult Note (Signed)
[facility] CLINIC CARDIOLOGY CONSULT NOTE       Patient ID: Lindsey Pollard MRN: [id number] DOB/AGE: April 18, 1941 81 y.o.  Admit date: 01/11/2022 Referring Physician Dr. Willy Eddy Primary Physician Dr. Einar Crow  Primary Cardiologist Dr. Juliann Pares Reason for Consultation NSTEMI  HPI: Lindsey Pollard is an 51yoF with a PMH of CAD s/p CABG x2 in 2018 (patent LIMA to LAD,  occluded SVG to OM1 by [facility] 05/2021), h/o PCI x1 distal RCA (50% ISR by [facility] 05/2021), ischemic CM with LVEF with moderate hypo of LV mid apical anterolateral wall, 55-60%, g1dd, mod MR 12/2021, CKD 3, hyperlipidemia, type 2 diabetes, CAD, GERD who was recently hospitalized at [facility] from 7/12 - 7/18 initially for shortness of breath and developed chest pain, VF arrest with successful resuscitation and her NSTEMI was treated medically.  She presents to [facility] ED 2 days after discharge with acute encephalopathy, nausea, vomiting, and was intubated for airway protection in the ED. cardiology was consulted due to her significant cardiac history and concern for NSTEMI.  The patient is intubated and sedated and is unable to provide a history, history is obtained from her daughter and son-in-law at bedside.  Her daughter states that patient returned home on Tuesday and was feeling well although generally weak and fatigued.  She was eating normally and ambulatory with a walker and did not complain of any chest discomfort -her usual anginal symptom is right shoulder and right-sided chest pain.  She did not have to take any sublingual nitroglycerin.  Her son-in-law notes she was sitting up in bed at 3 AM and was talking to herself yelling "shut up" over and over again was sitting up on the side of the bed somewhat slumped over and was just not acting right.  Around 6 AM she was vomiting and remained confused so they brought her to the ER.  In the ER she was ill-appearing but not hypoxic but was complaining that she could not breathe, subsequently  was intubated for airway protection, was hypotensive but responsive to IV fluids and short duration of Levophed but has not been titrated off.  Her EKG on presentation shows sinus bradycardia with rate 57 with ST depressions in lateral leads V5 and V6 new left bundle branch block.,  ST depressions somewhat improving on repeat 2 hours later.  Labs are notable for an elevated but downtrending troponin from 2206-1700, compared to 6335 peak during her admission last week.  Vitals are notable for an initial blood pressure of 132/44 and was bradycardic in the 50s, became hypotensive with Levophed administration and tachycardic, stabilized during interview with a blood pressure of 166/62 and heart rate in sinus rhythm in the 70s .   Labs are notable for hypokalemia 3.0, BUN/creatinine 48/1.59, GFR 33) at discharge on 7/18 creatinine is 1.18 and GFR is 47.  Wheezing 2.4.  BNP elevated 778.  Lactate trending 1.4-2.3, H&H around baseline of 8.9/27.8, platelets 364.  Chest x-ray without pleural effusion or pneumothorax or evidence of acute cardiopulmonary abnormality.  Head CT is negative.  CT chest abdomen pelvis with small bilateral pleural effusions findings suggestive of interstitial pulmonary edema and patchy densities in the left perihilar region concerning for pulmonary edema or pneumonia.   Review of systems limited by sedation   Past Medical History:  Diagnosis Date   Chest pain 12/06/2021   Diabetes mellitus without complication (HCC)    Hyperlipidemia    Hypertension    Myocardial infarction [facility])     Past Surgical History:  Procedure Laterality Date   CARDIAC SURGERY     bypass   CORONARY/GRAFT ACUTE MI REVASCULARIZATION N/A 06/01/2021   Procedure: Coronary/Graft Acute MI Revascularization;  Surgeon: Alwyn Pea, MD;  Location: [facility] INVASIVE CV LAB;  Service: Cardiovascular;  Laterality: N/A;   EYE SURGERY     cataract extraction   LEFT HEART CATH AND CORONARY ANGIOGRAPHY N/A  06/01/2021   Procedure: LEFT HEART CATH AND CORONARY ANGIOGRAPHY;  Surgeon: Alwyn Pea, MD;  Location: [facility] INVASIVE CV LAB;  Service: Cardiovascular;  Laterality: N/A;    Medications Prior to Admission  Medication Sig Dispense Refill Last Dose   acetaminophen (TYLENOL) 325 MG tablet Take 650 mg by mouth at bedtime.   PRN at PRN   albuterol (VENTOLIN HFA) 108 (90 Base) MCG/ACT inhaler Inhale 1-2 puffs into the lungs every 4 (four) hours as needed for shortness of breath.      aspirin 81 MG EC tablet Take by mouth.      atorvastatin (LIPITOR) 80 MG tablet Take 80 mg by mouth every evening.      baclofen (LIORESAL) 10 MG tablet Take 10 mg by mouth 3 (three) times daily.      Cholecalciferol 25 MCG (1000 UT) capsule Take 1,000 Units by mouth 2 (two) times daily.      clopidogrel (PLAVIX) 75 MG tablet Take 1 tablet (75 mg total) by mouth daily. 30 tablet 0    feeding supplement (ENSURE ENLIVE / ENSURE PLUS) LIQD Take 237 mLs by mouth 3 (three) times daily between meals. 237 mL 12    insulin glargine (LANTUS) 100 UNIT/ML injection Inject 8 Units into the skin at bedtime.      insulin lispro (HUMALOG) 100 UNIT/ML KwikPen Inject 0-2 Units into the skin 3 (three) times daily.      ipratropium-albuterol (DUONEB) 0.5-2.5 (3) MG/3ML SOLN Take 3 mLs by nebulization every 6 (six) hours as needed (shortness of breath).      isosorbide mononitrate (IMDUR) 30 MG 24 hr tablet Take 1 tablet (30 mg total) by mouth daily. 30 tablet 1    lisinopril (ZESTRIL) 20 MG tablet Take 1 tablet (20 mg total) by mouth daily. 30 tablet 1    lisinopril-hydrochlorothiazide (ZESTORETIC) 10-12.5 MG tablet Take 1 tablet by mouth daily.      metoprolol succinate (TOPROL-XL) 50 MG 24 hr tablet Take 50 mg by mouth daily.      Multiple Vitamin (MULTIVITAMIN WITH MINERALS) TABS tablet Take 1 tablet by mouth daily.      nitroGLYCERIN (NITROSTAT) 0.4 MG SL tablet Place 0.4 mg under the tongue every 5 (five) minutes as needed for  chest pain.      ondansetron (ZOFRAN-ODT) 4 MG disintegrating tablet Take 4 mg by mouth every 8 (eight) hours as needed.      senna (SENOKOT) 8.6 MG tablet Take 2 tablets by mouth at bedtime.      SPIRIVA HANDIHALER 18 MCG inhalation capsule Place 1 capsule into inhaler and inhale daily as needed for shortness of breath.      spironolactone (ALDACTONE) 25 MG tablet Take 12.5 mg by mouth daily.      torsemide (DEMADEX) 20 MG tablet Take 20 mg by mouth daily.      Social History   Socioeconomic History   Marital status: Divorced    Spouse name: Not on file   Number of children: Not on file   Years of education: Not on file   Highest education level: Not on file  Occupational  History   Not on file  Tobacco Use   Smoking status: Former    Types: Cigarettes   Smokeless tobacco: Never  Substance and Sexual Activity   Alcohol use: Not Currently   Drug use: Not Currently   Sexual activity: Not on file  Other Topics Concern   Not on file  Social History Narrative   Not on file   Social Determinants of Health   Financial Resource Strain: Not on file  Food Insecurity: Not on file  Transportation Needs: Not on file  Physical Activity: Not on file  Stress: Not on file  Social Connections: Not on file  Intimate Partner Violence: Not on file    Family History  Problem Relation Age of Onset   Congestive Heart Failure Mother    Diabetes Father    Breast cancer Neg Hx       PHYSICAL EXAM General: Elderly and chronically ill-appearing Caucasian female intubated lying in ICU bed with daughter and son-in-law at bedside  HEENT:  Normocephalic and atraumatic. Neck:  No JVD.  Lungs: Mechanically ventilated breath sounds, coarse to auscultation anteriorly Heart: HRRR . Normal S1 and S2 with 3/6 systolic murmur heard throughout.  Radial pulses nonpalpable Abdomen: Non-distended appearing.  Msk: Normal strength and tone for age. Extremities: Warm and well perfused. No clubbing, cyanosis.   No peripheral edema.  Neuro: Intubated and sedated, unable to assess Psych: Intubated and sedated, unable to assess  Labs:   Lab Results  Component Value Date   WBC 5.7 01/11/2022   HGB 8.9 (L) 01/11/2022   HCT 27.8 (L) 01/11/2022   MCV 96.2 01/11/2022   PLT 364 01/11/2022    Recent Labs  Lab 01/11/22 0719  NA 134*  K 3.0*  CL 99  CO2 26  BUN 48*  CREATININE 1.57*  CALCIUM 8.8*  PROT 6.5  BILITOT 0.7  ALKPHOS 66  ALT 33  AST 28  GLUCOSE 169*   No results found for: "CKTOTAL", "CKMB", "CKMBINDEX", "TROPONINI"  Lab Results  Component Value Date   CHOL 102 12/07/2021   CHOL 127 06/01/2021   Lab Results  Component Value Date   HDL 31 (L) 12/07/2021   HDL 47 06/01/2021   Lab Results  Component Value Date   LDLCALC 45 12/07/2021   LDLCALC 49 06/01/2021   Lab Results  Component Value Date   TRIG 101 01/04/2022   TRIG 128 12/07/2021   TRIG 154 (H) 06/01/2021   Lab Results  Component Value Date   CHOLHDL 3.3 12/07/2021   CHOLHDL 2.7 06/01/2021   No results found for: "LDLDIRECT"    Radiology: CT HEAD WO CONTRAST (5MM)  Result Date: 01/11/2022 CLINICAL DATA:  Altered mental status EXAM: CT HEAD WITHOUT CONTRAST TECHNIQUE: Contiguous axial images were obtained from the base of the skull through the vertex without intravenous contrast. RADIATION DOSE REDUCTION: This exam was performed according to the departmental dose-optimization program which includes automated exposure control, adjustment of the mA and/or kV according to patient size and/or use of iterative reconstruction technique. COMPARISON:  01/03/2022 FINDINGS: Brain: No acute intracranial findings are seen in noncontrast CT brain. There are no signs of bleeding within the cranium. Cortical sulci are prominent. Vascular: Unremarkable. Skull: Unremarkable Sinuses/Orbits: Unremarkable. Other: None. IMPRESSION: No acute intracranial findings are seen.  Atrophy. Electronically Signed   By: Elmer Picker  M.D.   On: 01/11/2022 08:28   CT CHEST ABDOMEN PELVIS WO CONTRAST  Result Date: 01/11/2022 CLINICAL DATA:  Shortness of breath, altered  mental status EXAM: CT CHEST, ABDOMEN AND PELVIS WITHOUT CONTRAST TECHNIQUE: Multidetector CT imaging of the chest, abdomen and pelvis was performed following the standard protocol without IV contrast. RADIATION DOSE REDUCTION: This exam was performed according to the departmental dose-optimization program which includes automated exposure control, adjustment of the mA and/or kV according to patient size and/or use of iterative reconstruction technique. COMPARISON:  01/03/2022 FINDINGS: CT CHEST FINDINGS Cardiovascular: Extensive coronary artery calcifications are seen. There is evidence of previous coronary bypass surgery. Scattered calcifications are seen in thoracic aorta and its major branches. Mediastinum/Nodes: Slightly enlarged lymph nodes are seen in mediastinum with no significant interval change. Tip of endotracheal tube is at the carina and should be pulled back 2-3 cm. Lungs/Pleura: Small patchy infiltrates are seen in left parahilar region and posterior lower lung fields. There is interval appearance of small bilateral pleural effusions. There is no pneumothorax. There is slight thickening of interlobular septi. Musculoskeletal: No acute findings are seen. CT ABDOMEN PELVIS FINDINGS Hepatobiliary: No focal abnormalities are seen in liver. There is no dilation of bile ducts. Gallbladder is distended, possibly due to fasting state. There is no wall thickening in gallbladder. Gallbladder stones are seen. Pancreas: No focal abnormalities are seen. Diverticulum is noted along the inner margin of second portion of duodenum. Spleen: Unremarkable. Adrenals/Urinary Tract: Adrenals are unremarkable. Left kidney is much smaller than right. This may be a congenital variation or suggest renal artery stenosis or chronic pyelonephritis. There is no hydronephrosis. There are no  renal or ureteral stones. Foley catheter is seen in the bladder. Stomach/Bowel: Tip of enteric tube is seen in the stomach. There is no dilation of small bowel loops. Appendix is unremarkable. There is no significant wall thickening in colon. Scattered diverticula are seen in the colon without signs of focal acute diverticulitis. Vascular/Lymphatic: Extensive arterial calcifications are seen. Reproductive: Coarse calcifications seen in pelvis may suggest calcified uterine fibroids. Other: There is no ascites or pneumoperitoneum. Musculoskeletal: Degenerative changes are noted in lumbar spine, particularly severe at L2-L3 and L3-L4 levels. This finding has not changed significantly. IMPRESSION: Tip of endotracheal tube is at the carina and should be pulled back 2-3 cm. There is interval appearance of small bilateral pleural effusions, more so on the left side. There is thickening of interlobular septi suggesting possible interstitial pulmonary edema. There are patchy densities in the left parahilar region and both lower lung fields which may be due to pulmonary edema or pneumonia. Extensive coronary artery calcifications are seen. There is no evidence of intestinal obstruction or pneumoperitoneum. There is no hydronephrosis. Gallbladder stones. Diverticulosis of colon without signs of diverticulitis. Other findings as described in the body of the report. Electronically Signed   By: Elmer Picker M.D.   On: 01/11/2022 08:27   DG Abd Portable 1 View  Result Date: 01/11/2022 CLINICAL DATA:  OG tube placement. EXAM: PORTABLE ABDOMEN - 1 VIEW COMPARISON:  AP chest 01/11/2022 at 704 hours FINDINGS: New orogastric tube with side port overlying the proximal stomach and tip overlying the mid aspect of the greater curvature. Nonobstructed upper abdominal bowel-gas pattern. No portal venous gas or pneumatosis. Severe left L3-4 disc space narrowing and moderate to severe diffuse L4-5 disc space narrowing. Mild  dextrocurvature centered at L3-4. IMPRESSION: Orogastric tube in appropriate position. Electronically Signed   By: Yvonne Kendall M.D.   On: 01/11/2022 08:11   DG Chest Portable 1 View  Result Date: 01/11/2022 CLINICAL DATA:  Tube placement.  Encounter for intubation. EXAM: PORTABLE CHEST  1 VIEW COMPARISON:  AP chest 01/11/2022 at 704 hours FINDINGS: AP chest 01/11/2022 at 735 hours. Interval placement of endotracheal tube with tip terminating imaging of the thoracic inlet and the carina proximally 3.2 cm above the carina. Enteric tube descends below the diaphragm with the side port overlying the proximal stomach and the tip excluded by inferior collimation, new from prior. Postsurgical changes are again seen of median sternotomy and CABG. Cardiac silhouette and mediastinal contours are within normal limits. Moderate calcification within the aortic arch. Mild left basilar linear likely subsegmental atelectasis. No definite pleural effusion. No pneumothorax is seen. No acute skeletal abnormality. IMPRESSION: 1. New endotracheal tube and enteric tube in appropriate position. 2. No acute lung process. 3.  Aortic Atherosclerosis (ICD10-I70.0). Electronically Signed   By: Yvonne Kendall M.D.   On: 01/11/2022 08:10   DG Chest Portable 1 View  Result Date: 01/11/2022 CLINICAL DATA:  Provided history: New onset dyspnea, recent cardiac event. EXAM: PORTABLE CHEST 1 VIEW COMPARISON:  Prior chest radiographs 01/04/2022 and earlier. FINDINGS: Prior median sternotomy/CABG. Heart size within normal limits. Aortic atherosclerosis. No appreciable airspace consolidation or pulmonary edema. No evidence of pleural effusion or pneumothorax. No acute bony abnormality identified. IMPRESSION: No evidence of acute cardiopulmonary abnormality. Aortic Atherosclerosis (ICD10-I70.0). Electronically Signed   By: Kellie Simmering D.O.   On: 01/11/2022 08:03   ECHOCARDIOGRAM LIMITED  Result Date: 01/04/2022    ECHOCARDIOGRAM LIMITED REPORT    Patient Name:   TANETTA SUTTON [facility] Date of Exam: 01/04/2022 Medical Rec #:  [id number]     Height:       59.0 in Accession #:    [id number]    Weight:       127.4 lb Date of Birth:  [birth year]     BSA:          1.523 m Patient Age:    70 years      BP:           128/50 mmHg Patient Gender: F             HR:           54 bpm. Exam Location:  [facility] Procedure: Limited Echo, Color Doppler and Cardiac Doppler Indications:     Cardiac Areest I46.9  History:         Patient has prior history of Echocardiogram examinations, most                  recent 12/07/2021. Previous Myocardial Infarction,                  Signs/Symptoms:Chest Pain; Risk Factors:Diabetes and                  Hypertension.  Sonographer:     Sherrie Sport Referring Phys:  [id number] [place] Veneda Kirksey Diagnosing Phys: Serafina Royals MD  Sonographer Comments: Image acquisition challenging due to patient behavioral factors. IMPRESSIONS  1. The left ventricle demonstrates regional wall motion abnormalities (see scoring diagram/findings for description).  2. Right ventricular systolic function is normal. The right ventricular size is normal.  3. Left atrial size was moderately dilated.  4. The mitral valve is normal in structure. Moderate mitral valve regurgitation.  5. Tricuspid valve regurgitation is mild to moderate.  6. The aortic valve is normal in structure. Aortic valve regurgitation is mild. FINDINGS  Left Ventricle: The left ventricle demonstrates regional wall motion abnormalities. Moderate hypokinesis of the left ventricular, mid-apical anterolateral wall. Right Ventricle: The right ventricular  size is normal. No increase in right ventricular wall thickness. Right ventricular systolic function is normal. Left Atrium: Left atrial size was moderately dilated. Right Atrium: Right atrial size was normal in size. Pericardium: There is no evidence of pericardial effusion. Mitral Valve: The mitral valve is normal in structure. Moderate mitral valve regurgitation.  Tricuspid Valve: The tricuspid valve is normal in structure. Tricuspid valve regurgitation is mild to moderate. Aortic Valve: The aortic valve is normal in structure. Aortic valve regurgitation is mild. Pulmonic Valve: The pulmonic valve was normal in structure. Pulmonic valve regurgitation is trivial. Aorta: The aortic root and ascending aorta are structurally normal, with no evidence of dilitation. IAS/Shunts: No atrial level shunt detected by color flow Doppler. LEFT VENTRICLE PLAX 2D LVIDd:         4.40 cm LVIDs:         3.20 cm LV PW:         1.10 cm LV IVS:        1.00 cm LVOT diam:     2.00 cm LVOT Area:     3.14 cm  LEFT ATRIUM         Index LA diam:    3.40 cm 2.23 cm/m   AORTA Ao Root diam: 2.90 cm  SHUNTS Systemic Diam: 2.00 cm Arnoldo Hooker MD Electronically signed by Arnoldo Hooker MD Signature Date/Time: 01/04/2022/4:56:35 PM    Final    DG Chest 1 View  Result Date: 01/04/2022 CLINICAL DATA:  81 year old female intubated.  Post CPR. EXAM: CHEST  1 VIEW COMPARISON:  Portable chest 01/03/2022 and earlier. FINDINGS: Portable AP semi upright view at 0611 hours. Endotracheal tube tip partially obscured by sternotomy hardware, but appears to be in good position between the level the clavicles and carina. Enteric tube courses into the abdomen, tip is not included. Stable pacer or resuscitation pads over the left chest. Stable lung volumes. Mediastinal contours remain normal. Allowing for portable technique the lungs are clear. No pneumothorax or pleural effusion identified. Negative visible bowel gas. Stable visualized osseous structures. IMPRESSION: 1. Satisfactory ET tube position. Enteric tube courses to the abdomen, tip not included. 2. No acute cardiopulmonary abnormality. Electronically Signed   By: Odessa Fleming M.D.   On: 01/04/2022 07:26   DG Chest Portable 1 View  Result Date: 01/03/2022 CLINICAL DATA:  Intubation, post CPR EXAM: PORTABLE CHEST 1 VIEW COMPARISON:  01/03/2022 FINDINGS:  Endotracheal tube is in the right mainstem bronchus. Recommend retracting approximately 5 cm. NG tube enters the stomach. Prior CABG. Heart is normal size. Increasing airspace disease throughout the left lung which may reflect atelectasis due to right mainstem intubation. Similar right upper lobe opacity/atelectasis. No effusions. IMPRESSION: Right mainstem intubation. Recommend retracting endotracheal tube approximately 5 cm. Areas of airspace disease in the left lung and right upper lobe may be related to atelectasis from right mainstem intubation. These results were called by telephone at the time of interpretation on 01/03/2022 at 10:21 pm to provider [facility] , who verbally acknowledged these results. Electronically Signed   By: Charlett Nose M.D.   On: 01/03/2022 22:24   CT Cervical Spine Wo Contrast  Result Date: 01/03/2022 CLINICAL DATA:  Neck trauma (Age >= 65y) EXAM: CT CERVICAL SPINE WITHOUT CONTRAST TECHNIQUE: Multidetector CT imaging of the cervical spine was performed without intravenous contrast. Multiplanar CT image reconstructions were also generated. RADIATION DOSE REDUCTION: This exam was performed according to the departmental dose-optimization program which includes automated exposure control, adjustment of the mA and/or  kV according to patient size and/or use of iterative reconstruction technique. COMPARISON:  None Available. FINDINGS: Alignment: Normal Skull base and vertebrae: No acute fracture. No primary bone lesion or focal pathologic process. Soft tissues and spinal canal: No prevertebral fluid or swelling. No visible canal hematoma. 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 Mild bilateral degenerative facet disease. Upper chest: No acute findings Other: None IMPRESSION: Degenerative disc and facet disease.  No acute bony abnormality. Electronically Signed   By: Rolm Baptise M.D.   On: 01/03/2022 20:10   CT HEAD WO CONTRAST  (5MM)  Result Date: 01/03/2022 CLINICAL DATA:  Head trauma, minor (Age >= 65y) EXAM: CT HEAD WITHOUT CONTRAST TECHNIQUE: Contiguous axial images were obtained from the base of the skull through the vertex without intravenous contrast. RADIATION DOSE REDUCTION: This exam was performed according to the departmental dose-optimization program which includes automated exposure control, adjustment of the mA and/or kV according to patient size and/or use of iterative reconstruction technique. COMPARISON:  10/03/2021 FINDINGS: Brain: No acute intracranial abnormality. Specifically, no hemorrhage, hydrocephalus, mass lesion, acute infarction, or significant intracranial injury. Vascular: No hyperdense vessel or unexpected calcification. Skull: No acute calvarial abnormality. Sinuses/Orbits: No acute findings Other: None IMPRESSION: Normal study for patient's age. Electronically Signed   By: Rolm Baptise M.D.   On: 01/03/2022 20:08   CT CHEST ABDOMEN PELVIS WO CONTRAST  Result Date: 01/03/2022 CLINICAL DATA:  Pneumonia, complication suspected, xray done. Shortness of breath. EXAM: CT CHEST, ABDOMEN AND PELVIS WITHOUT CONTRAST TECHNIQUE: Multidetector CT imaging of the chest, abdomen and pelvis was performed following the standard protocol without IV contrast. RADIATION DOSE REDUCTION: This exam was performed according to the departmental dose-optimization program which includes automated exposure control, adjustment of the mA and/or kV according to patient size and/or use of iterative reconstruction technique. COMPARISON:  None Available. FINDINGS: CT CHEST FINDINGS Cardiovascular: Heavily calcified aorta and coronary arteries. Prior CABG. Heart is normal size. Aorta is normal caliber. Mediastinum/Nodes: No mediastinal, hilar, or axillary adenopathy. Trachea and esophagus are unremarkable. Thyroid unremarkable. Lungs/Pleura: Lungs are clear. No focal airspace opacities or suspicious nodules. No effusions.  Musculoskeletal: Chest wall soft tissues are unremarkable. No acute bony abnormality. CT ABDOMEN PELVIS FINDINGS Hepatobiliary: Small gallstone within the gallbladder. No focal hepatic abnormality. Pancreas: No focal abnormality or ductal dilatation. Spleen: No focal abnormality.  Normal size. Adrenals/Urinary Tract: Left kidney is atrophic. No renal or adrenal mass. No stones or hydronephrosis. Urinary bladder unremarkable. Stomach/Bowel: Left colonic diverticulosis. No active diverticulitis. Stomach and small bowel decompressed, unremarkable. Vascular/Lymphatic: Heavily calcified aorta. No evidence of aneurysm or adenopathy. Reproductive: Calcified fibroids in the uterus.  No adnexal masses. Other: No free fluid or free air. Musculoskeletal: No acute bony abnormality. Scoliosis and degenerative changes in the lumbar spine. IMPRESSION: No acute cardiopulmonary disease. Diffuse aortoiliac atherosclerosis.  No aneurysm. Cholelithiasis. Left colonic diverticulosis.  No active diverticulitis. No acute findings in the abdomen or pelvis. Electronically Signed   By: Rolm Baptise M.D.   On: 01/03/2022 20:05   DG Chest 2 View  Result Date: 01/03/2022 CLINICAL DATA:  Shortness of breath EXAM: CHEST - 2 VIEW COMPARISON:  12/06/2021 FINDINGS: Prior CABG. Heart and mediastinal contours are within normal limits. No focal opacities or effusions. No acute bony abnormality. IMPRESSION: No active cardiopulmonary disease. Electronically Signed   By: Rolm Baptise M.D.   On: 01/03/2022 19:25    ECHO 01/04/2022  1. The left ventricle demonstrates regional wall  motion abnormalities  (see scoring diagram/findings for description).   2. Right ventricular systolic function is normal. The right ventricular  size is normal.   3. Left atrial size was moderately dilated.   4. The mitral valve is normal in structure. Moderate mitral valve  regurgitation.   5. Tricuspid valve regurgitation is mild to moderate.   6. The aortic valve  is normal in structure. Aortic valve regurgitation is  mild.   FINDINGS   Left Ventricle: The left ventricle demonstrates regional wall motion  abnormalities. Moderate hypokinesis of the left ventricular, mid-apical  anterolateral wall.   Right Ventricle: The right ventricular size is normal. No increase in  right ventricular wall thickness. Right ventricular systolic function is  normal.   Left Atrium: Left atrial size was moderately dilated.   Right Atrium: Right atrial size was normal in size.   Pericardium: There is no evidence of pericardial effusion.   Mitral Valve: The mitral valve is normal in structure. Moderate mitral  valve regurgitation.   Tricuspid Valve: The tricuspid valve is normal in structure. Tricuspid  valve regurgitation is mild to moderate.   Aortic Valve: The aortic valve is normal in structure. Aortic valve  regurgitation is mild.   Pulmonic Valve: The pulmonic valve was normal in structure. Pulmonic valve  regurgitation is trivial.   Aorta: The aortic root and ascending aorta are structurally normal, with  no evidence of dilitation.   IAS/Shunts: No atrial level shunt detected by color flow Doppler.   12/07/2021  1. Left ventricular ejection fraction, by estimation, is 55 to 60%. The  left ventricle has normal function. The left ventricle has no regional  wall motion abnormalities. Left ventricular diastolic parameters are  consistent with Grade I diastolic  dysfunction (impaired relaxation).   2. Right ventricular systolic function is normal. The right ventricular  size is normal.   3. The mitral valve is normal in structure. Moderate mitral valve  regurgitation.   4. The aortic valve is normal in structure. Aortic valve regurgitation is  mild.   FINDINGS   Left Ventricle: Left ventricular ejection fraction, by estimation, is 55  to 60%. The left ventricle has normal function. The left ventricle has no  regional wall motion abnormalities.  The left ventricular internal cavity  size was normal in size. There is   no left ventricular hypertrophy. Left ventricular diastolic parameters  are consistent with Grade I diastolic dysfunction (impaired relaxation).   Right Ventricle: The right ventricular size is normal. No increase in  right ventricular wall thickness. Right ventricular systolic function is  normal.   Left Atrium: Left atrial size was normal in size.   Right Atrium: Right atrial size was normal in size.   Pericardium: There is no evidence of pericardial effusion.   Mitral Valve: The mitral valve is normal in structure. Moderate mitral  valve regurgitation. MV peak gradient, 4.8 mmHg. The mean mitral valve  gradient is 1.0 mmHg.   Tricuspid Valve: The tricuspid valve is grossly normal. Tricuspid valve  regurgitation is mild.   Aortic Valve: The aortic valve is normal in structure. Aortic valve  regurgitation is mild. Aortic regurgitation PHT measures 567 msec. Aortic  valve mean gradient measures 10.0 mmHg. Aortic valve peak gradient  measures 16.9 mmHg. Aortic valve area, by VTI   measures 1.12 cm.   Pulmonic Valve: The pulmonic valve was normal in structure. Pulmonic valve  regurgitation is not visualized.   Aorta: The ascending aorta was not well visualized.  IAS/Shunts: No atrial level shunt detected by color flow Doppler.   TELEMETRY reviewed by me: sinus rhythm rate 70s  EKG reviewed by me: sinus bradycardia 57, lateral ST depressions V5-6, improving on repeat. LBBB  ASSESSMENT AND PLAN:  Auralia Meiring is an 69yoF with a PMH of CAD s/p CABG x2 in 2018 (patent LIMA to LAD,  occluded SVG to OM1 by [facility] 05/2021), h/o PCI x1 distal RCA (50% ISR by [facility] 05/2021), ischemic CM with LVEF with moderate hypo of LV mid apical anterolateral wall, 55-60%, g1dd, mod MR 12/2021, CKD 3, hyperlipidemia, type 2 diabetes, CAD, GERD who was recently hospitalized at [facility] from 7/12 - 7/18 initially for shortness of breath  and developed chest pain, VF arrest with successful resuscitation and her NSTEMI was treated medically.  She presents to [facility] ED 2 days after discharge with acute encephalopathy, nausea, vomiting, and was intubated for airway protection in the ED. cardiology was consulted due to her significant cardiac history and concern for NSTEMI.  #Acute encephalopathy #recent NSTEMI 01/04/2022 - h/o significant CAD s/p CABG x2 with known occluded SVG to OM1, LIMA to LAD patent by [facility] 05/2021 #Ischemic cardiomyopathy #Acute hypoxic respiratory failure Patient has a complicated and significant history of CAD with multiple admissions over the past year for chest pain and has been medically managed due to patient preference.  She was most recently hospitalized last week with chest pain, VF arrest with ROSC requiring intubation.  Initial EKG showed significant lateral ST depressions and echo showed a new LV mid apical anterolateral hypokinesis.  Her NSTEMI was ultimately treated medically.  She presents again 2 days after discharge with confusion and acting strangely with multiple episodes of emesis.  Her troponin is downtrending from last admission at 2206-1700 (compared to a peak of 6300 previously). Her EKG shows lateral ST depressions and LBBB, improving on repeats. UA is positive for leukocytes she is being treated for possible aspiration pneumonia.  -Agree with current therapy per primary team -Give 325 mg aspirin, continue DAPT with aspirin 81 mg and 75 mg clopidogrel daily. -Continue heparin drip for 48 hours -Continue atorvastatin 80 mg daily -Restart her home antianginals/antihypertensives as her blood pressure allows: Metoprolol succinate 50 mg XL, isosorbide 30 mg daily. -Hold torsemide 20 mg, spironolactone 12.5 mg, lisinopril 20 mg due to AKI -Limited echo -Hopefully she can be extubated and further conversations regarding goals of care can occur.  Per Dr. Clayborn Bigness, plan to hold off on heart cath at this  time and continue medical therapy as above.  This patient's plan of care was discussed and created with Dr. Clayborn Bigness and he is in agreement.  Signed: Tristan Schroeder , PA-C 01/11/2022, 11:33 AM [facility]

## 2022-01-11 NOTE — Consult Note (Signed)
Pharmacy Antibiotic Note  Lindsey Pollard is a 81 y.o. female w/ h/o DM, CAD s/p CABG, RLS, GERD, CHF, HTN, &  CKD admitted on 01/11/2022 with pneumonia.  Pharmacy has been consulted for Zosyn dosing.  Plan: Zosyn 3.375g IV q8h (4 hour infusion).  Height: 4\' 11"  (149.9 cm) Weight: 63.7 kg (140 lb 8 oz) IBW/kg (Calculated) : 43.2  Temp (24hrs), Avg:96.5 F (35.8 C), Min:96.4 F (35.8 C), Max:97.5 F (36.4 C)  Recent Labs  Lab 01/05/22 0644 01/06/22 0513 01/07/22 0438 01/08/22 0426 01/09/22 0504 01/11/22 0719 01/11/22 0840  WBC 11.1* 10.8* 10.2  --   --  5.7  --   CREATININE 1.49* 1.22* 1.33* 1.39* 1.18* 1.57*  --   LATICACIDVEN  --   --   --   --   --   --  1.4    Estimated Creatinine Clearance: 23.2 mL/min (A) (by C-G formula based on SCr of 1.57 mg/dL (H)).    Allergies  Allergen Reactions   Tetanus Toxoids Swelling    Tetanus and diphtheria toxids   Penicillin G Rash    Antimicrobials this admission: Zosyn (7/20 >>   Dose adjustments this admission: AkI on CKD. Will CTM closely and adjust PRN  Microbiology results: 7/20 BCx: pending 7/20 UCx: pending  7/20 Trach Aspirate: pending  7/20 MRSA PCR: pending 7/20 COVID - pending  Thank you for allowing pharmacy to be a part of this patient's care.  8/20 [name] 01/11/2022 11:08 AM

## 2022-01-11 NOTE — Procedures (Signed)
Central Venous Catheter Insertion Procedure Note Lindsey Pollard [id number] Aug 25, 1940  Procedure: Insertion of Central Venous Catheter Indications: Assessment of intravascular volume, Drug and/or fluid administration, and Frequent blood sampling  Procedure Details Consent: Unable to obtain consent because of emergent medical necessity. Time Out: Verified patient identification, verified procedure, site/side was marked, verified correct patient position, special equipment/implants available, medications/allergies/relevent history reviewed, required imaging and test results available.  Performed  Maximum sterile technique was used including antiseptics, cap, gloves, gown, hand hygiene, mask, and sheet. Skin prep: Chlorhexidine; local anesthetic administered A antimicrobial bonded/coated triple lumen catheter was placed in the left internal jugular vein using the Seldinger technique.  Evaluation Blood flow good Complications: No apparent complications Patient did tolerate procedure well. Chest X-ray ordered to verify placement.  CXR: pending.   Line secured at the 19 cm mark. BIOPATCH applied to the insertion site.  Harlon Ditty, AGACNP-BC [facility] Prefer epic messenger for cross cover needs If after hours, please call E-link  Judithe Modest 01/11/2022, 6:15 PM

## 2022-01-11 NOTE — ED Notes (Signed)
100 roc given by Geraldine Contras, RN

## 2022-01-11 NOTE — Progress Notes (Signed)
Pt transported to and from CT with RN w/o incident 

## 2022-01-11 NOTE — ED Provider Notes (Signed)
[facility] Provider Note    None    (approximate)   History   Altered Mental Status, Nausea, and Shortness of Breath   HPI  Lindsey Pollard is a 81 y.o. female presents to the ER for evaluation of shortness of breath nausea vomiting.  Patient recently discharged from the hospital after V-fib arrest brief ICU stay has extensive history of CAD status post CABG.  Patient unable to provide much additional history due to hard hearing and altered mental status.  Family reports that she woke up this morning complaining of shortness of breath chest discomfort and had several episodes of nonmelanotic nonbloody vomit     Physical Exam   Triage Vital Signs: ED Triage Vitals  Enc Vitals Group     BP      Pulse      Resp      Temp      Temp src      SpO2      Weight      Height      Head Circumference      Peak Flow      Pain Score      Pain Loc      Pain Edu?      Excl. in GC?     Most recent vital signs: Vitals:   01/11/22 0925 01/11/22 0930  BP: (!) 106/43 (!) 105/42  Pulse: 61 61  Resp: 20 20  Temp: (!) 96.5 F (35.8 C) (!) 96.5 F (35.8 C)  SpO2: 96% 96%     Constitutional: Patient does open eyes to sternal rub but drowsy. Eyes: Conjunctivae are normal.  Head: Atraumatic. Nose: No congestion/rhinnorhea. Mouth/Throat: Mucous membranes are moist.   Neck: Painless ROM.  Cardiovascular:   Good peripheral circulation. No mg/r Respiratory: Poor inspiratory effort diminished breath sounds throughout. Gastrointestinal: Soft and nontender.  Musculoskeletal:  no deformity Neurologic:  MAE spontaneously. No gross focal neurologic deficits are appreciated.  Skin:  Skin is cool, dry and intact. No rash noted. Psychiatric: Mood and affect are normal. Speech and behavior are normal.    ED Results / Procedures / Treatments   Labs (all labs ordered are listed, but only abnormal results are displayed) Labs Reviewed  BRAIN NATRIURETIC PEPTIDE -  Abnormal; Notable for the following components:      Result Value   B Natriuretic Peptide 778.3 (*)    All other components within normal limits  COMPREHENSIVE METABOLIC PANEL - Abnormal; Notable for the following components:   Sodium 134 (*)    Potassium 3.0 (*)    Glucose, Bld 169 (*)    BUN 48 (*)    Creatinine, Ser 1.57 (*)    Calcium 8.8 (*)    Albumin 3.3 (*)    GFR, Estimated 33 (*)    All other components within normal limits  CBC WITH DIFFERENTIAL/PLATELET - Abnormal; Notable for the following components:   RBC 2.89 (*)    Hemoglobin 8.9 (*)    HCT 27.8 (*)    All other components within normal limits  URINALYSIS, ROUTINE W REFLEX MICROSCOPIC - Abnormal; Notable for the following components:   Color, Urine YELLOW (*)    APPearance CLOUDY (*)    Leukocytes,Ua LARGE (*)    WBC, UA >50 (*)    Bacteria, UA FEW (*)    All other components within normal limits  BLOOD GAS, ARTERIAL - Abnormal; Notable for the following components:   pH, Arterial 7.34 (*)  pO2, Arterial 126 (*)    All other components within normal limits  GLUCOSE, CAPILLARY - Abnormal; Notable for the following components:   Glucose-Capillary 172 (*)    All other components within normal limits  CBG MONITORING, ED - Abnormal; Notable for the following components:   Glucose-Capillary 182 (*)    All other components within normal limits  TROPONIN I (HIGH SENSITIVITY) - Abnormal; Notable for the following components:   Troponin I (High Sensitivity) 2,206 (*)    All other components within normal limits  TROPONIN I (HIGH SENSITIVITY) - Abnormal; Notable for the following components:   Troponin I (High Sensitivity) 1,739 (*)    All other components within normal limits  URINE CULTURE  CULTURE, BLOOD (ROUTINE X 2)  CULTURE, BLOOD (ROUTINE X 2)  CULTURE, RESPIRATORY W GRAM STAIN  PROTIME-INR  URINE DRUG SCREEN, QUALITATIVE ([facility] ONLY)  LACTIC ACID, PLASMA  APTT  PROTIME-INR  PROCALCITONIN  MAGNESIUM   LACTIC ACID, PLASMA  HEPARIN LEVEL (UNFRACTIONATED)  POTASSIUM     EKG  ED ECG REPORT I, Willy Eddy, the attending physician, personally viewed and interpreted this ECG.   Date: 01/11/2022  EKG Time: 6:57  Rate: 60  Rhythm: sinus  Axis: normal  Intervals:lbbb, normal qt  ST&T Change: no stemi, no depressions    RADIOLOGY Please see ED Course for my review and interpretation.  I personally reviewed all radiographic images ordered to evaluate for the above acute complaints and reviewed radiology reports and findings.  These findings were personally discussed with the patient.  Please see medical record for radiology report.    PROCEDURES:  Critical Care performed: Yes, see critical care procedure note(s)  .Critical Care  Performed by: Willy Eddy, MD Authorized by: Willy Eddy, MD   Critical care provider statement:    Critical care time (minutes):  45   Critical care was necessary to treat or prevent imminent or life-threatening deterioration of the following conditions:  Circulatory failure   Critical care was time spent personally by me on the following activities:  Ordering and performing treatments and interventions, ordering and review of laboratory studies, ordering and review of radiographic studies, pulse oximetry, re-evaluation of patient's condition, review of old charts, obtaining history from patient or surrogate, examination of patient, evaluation of patient's response to treatment, discussions with primary provider, discussions with consultants and development of treatment plan with patient or surrogate Procedure Name: Intubation Date/Time: 01/11/2022 11:18 AM  Performed by: Willy Eddy, MDPre-anesthesia Checklist: Patient identified, Emergency Drugs available, Suction available and Patient being monitored Preoxygenation: Pre-oxygenation with 100% oxygen Induction Type: IV induction and Rapid sequence Laryngoscope Size:  Glidescope Tube size: 7.5 mm Number of attempts: 1 Airway Equipment and Method: Video-laryngoscopy Placement Confirmation: ETT inserted through vocal cords under direct vision, CO2 detector and Breath sounds checked- equal and bilateral Secured at: 21 cm Tube secured with: ETT holder Dental Injury: Teeth and Oropharynx as per pre-operative assessment        MEDICATIONS ORDERED IN ED: Medications  etomidate (AMIDATE) 2 MG/ML injection (  Canceled Entry 01/11/22 1019)  midazolam (VERSED) 100 mg/100 mL (1 mg/mL) premix infusion (0.5 mg/hr Intravenous New Bag/Given 01/11/22 0813)  fentaNYL in NS (74mcg/ml) infusion-PREMIX (25 mcg/hr Intravenous Restarted 01/11/22 1018)  heparin ADULT infusion 100 units/mL (25000 units/212mL) (700 Units/hr Intravenous New Bag/Given 01/11/22 1055)  docusate (COLACE) 50 MG/5ML liquid 100 mg (has no administration in time range)  polyethylene glycol (MIRALAX / GLYCOLAX) packet 17 g (has no administration in time  range)  pantoprazole (PROTONIX) injection 40 mg (has no administration in time range)  acetaminophen (TYLENOL) tablet 650 mg (has no administration in time range)  ondansetron (ZOFRAN) injection 4 mg (has no administration in time range)  docusate (COLACE) 50 MG/5ML liquid 100 mg (100 mg Per Tube Given 01/11/22 1038)  polyethylene glycol (MIRALAX / GLYCOLAX) packet 17 g (17 g Per Tube Given 01/11/22 1038)  fentaNYL (SUBLIMAZE) bolus via infusion 25-100 mcg (has no administration in time range)  potassium chloride 10 mEq in 100 mL IVPB (10 mEq Intravenous New Bag/Given 01/11/22 1105)  aspirin tablet 325 mg (has no administration in time range)  0.9 %  sodium chloride infusion (0 mLs Intravenous Hold 01/11/22 1037)  norepinephrine (LEVOPHED) 4mg  in (0.016 mg/mL) premix infusion (0 mcg/min Intravenous Hold 01/11/22 1038)  insulin aspart (novoLOG) injection 0-15 Units (has no administration in time range)  piperacillin-tazobactam (ZOSYN) IVPB  3.375 g (has no administration in time range)  clopidogrel (PLAVIX) tablet 75 mg (has no administration in time range)  heparin bolus via infusion 3,500 Units (3,500 Units Intravenous Bolus from Bag 01/11/22 1055)     IMPRESSION / MDM / ASSESSMENT AND PLAN / ED COURSE  I reviewed the triage vital signs and the nursing notes.                              Differential diagnosis includes, but is not limited to, ACS, pericarditis, esophagitis, boerhaaves, pe, dissection, pna, bronchitis, costochondritis  Presents to the ER for evaluation of symptoms as described above.  Patient clinically very ill-appearing not hypoxic but in respiratory distress with EKG changes concerning for NSTEMI not meeting STEMI criteria.  Patient became hypotensive despite giving IV fluids intermittently responsive therefore when she started having episode of emesis she was intubated for airway protection.  This presenting complaint could reflect a potentially life-threatening illness therefore the patient will be placed on continuous pulse oximetry and telemetry for monitoring.  Laboratory evaluation will be sent to evaluate for the above complaints.      Clinical Course as of 01/11/22 1119  Thu Jan 11, 2022  0730 Chest x-ray on my review does not show any evidence of pneumothorax. [PR]  Jan 13, 2022 Hemodynamics are stabilizing after intubation and IV fluids.  Still awaiting blood work.  My review and interpretation of postintubation x-ray shows tubes and OG in appropriate position [PR]  0826 Troponin is elevated but downtrending from previous.  CT imaging without acute abnormality.  Possible CHF.  Will consult ICU for admission.  I will heparinize.  Will consult cardiology. [PR]    Clinical Course User Index [PR] [id number], MD     FINAL CLINICAL IMPRESSION(S) / ED DIAGNOSES   Final diagnoses:  NSTEMI (non-ST elevated myocardial infarction) [facility])     Rx / DC Orders   ED Discharge Orders     None         Note:  This document was prepared using Dragon voice recognition software and may include unintentional dictation errors.    [facility], MD 01/11/22 [phone]

## 2022-01-11 NOTE — ED Notes (Signed)
20 etomidate given by Geraldine Contras, RN

## 2022-01-11 NOTE — Progress Notes (Signed)
Attempted to place right femoral arterial line, however unable to thread guidewire.   Zada Girt, AGNP  Pulmonary/Critical Care Pager [phone] (please enter 7 digits) PCCM Consult Pager [phone] (please enter 7 digits)

## 2022-01-11 NOTE — Consult Note (Addendum)
PHARMACY CONSULT NOTE  Pharmacy Consult for Electrolyte Monitoring and Replacement   Recent Labs: Potassium (mmol/L)  Date Value  01/11/2022 3.0 (L)   Magnesium (mg/dL)  Date Value  04/59/9774 2.1   Calcium (mg/dL)  Date Value  14/23/9532 8.8 (L)   Albumin (g/dL)  Date Value  02/33/4356 3.3 (L)   Phosphorus (mg/dL)  Date Value  86/16/8372 3.8   Sodium (mmol/L)  Date Value  01/11/2022 134 (L)   Assessment: Patient is an 81 y/o F with medical history including CAD s/p CABG, HTN, HLD, DM, PAD, CHF (EF 45-50%), CKD-3a, RLS, recent admission 01/03/22 thru 01/09/22 for NSTEMI, Vfib cardiac arrest, AKI now presenting to the ED 7/20 with nausea, vomiting, SOB, lethargy. Patient ultimately required intubation.  Labs notable for elevated troponin, BNP, Scr, hypokalemia  Goal of Therapy:  K >= 4, Mg >= 2 All other electrolytes within normal limits  Plan:  --K 3, IV Kcl 10 mEq x 2; replace cautiously given renal dysfunction and instability. Also note recent Vfib arrest so need to monitor closely --No further electrolyte replacement indicated at this time --Re-check potassium at 1700, follow-up electrolytes again with AM labs tomorrow  Lindsey Pollard 01/11/2022 9:03 AM

## 2022-01-11 NOTE — ED Notes (Signed)
Informed RN bed assigned 

## 2022-01-11 NOTE — H&P (Signed)
NAME:  Lindsey Pollard, MRN:  [id number], DOB:  04-Aug-1940, LOS: 0 ADMISSION DATE:  01/11/2022, CONSULTATION DATE:  01/11/2022 REFERRING MD:  Dr. Quentin Cornwall, CHIEF COMPLAINT:  Shortness of Breath, Nausea/vomiting, AMS   Brief Pt Description / Synopsis:  81 year old female admitted with acute metabolic encephalopathy and acute respiratory failure in the setting of acute on chronic HFpEF, elevated troponins (NSTEMI versus demand ischemia), UTI and presumed aspiration.  Required intubation for airway protection.  History of Present Illness:  Lindsey Pollard is a 81 year old female with a past medical history significant for recent V-fib arrest, HFpEF, ischemic cardiomyopathy, CAD status post CABG x2 in 2018 and PCI, chronic kidney disease stage III, diabetes mellitus who presents to [facility] ED on 01/11/2022 due to altered mental status, respiratory distress, and nausea/vomiting.  Patient is currently intubated and sedated, therefore history is obtained from patient's daughter at bedside along with chart review.  Per the patient's daughter, the patient was recently admitted from 7/12 through 7/18 for brief cardiac arrest in the setting of NSTEMI and acute CHF, along with acute kidney injury.  Her mother was discharged home with PT/OT and cardiac rehab.  However since discharge her mother has been severely weak, very sleepy (more than normal), with poor p.o. intake.  Earlier this morning around the patient was very altered and confused/ "not acting right",vomiting (nonbloody), and appeared short of breath.  In the ED she was ill appearing but not hypoxic.  She was complaining she could not breathe, and subsequently was intubated for airway protection.  ED Course: Initial Vital Signs: Temperature 97.5 orally, respiratory rate 12, pulse 55, blood pressure 132/44, SPO2 100% on 2 L nasal cannula Significant Labs: Sodium 134, potassium 3.0, glucose 169, BUN 48, creatinine 1.57, high-sensitivity troponin 2206, BNP 778,  hemoglobin 8.9, hematocrit 27.8, lactic acid 1.4 Post intubation ABG: pH 7.34/PCO2 46/PO2 126/bicarb 24.8 Urinalysis is concerning for UTI (large leukocytes, greater than 50 WBC) Imaging Chest X-ray>>FINDINGS: Prior median sternotomy/CABG. Heart size within normal limits. Aortic atherosclerosis. No appreciable airspace consolidation or pulmonary edema. No evidence of pleural effusion or pneumothorax. No acute bony abnormality identified. CT head without contrast>>IMPRESSION: No acute intracranial findings are seen.  Atrophy. CT chest/abdomen/pelvis>>IMPRESSION: Tip of endotracheal tube is at the carina and should be pulled back 2-3 cm. There is interval appearance of small bilateral pleural effusions, more so on the left side. There is thickening of interlobular septi suggesting possible interstitial pulmonary edema. There are patchy densities in the left parahilar region and both lower lung fields which may be due to pulmonary edema or pneumonia. Extensive coronary artery calcifications are seen. There is no evidence of intestinal obstruction or pneumoperitoneum. There is no hydronephrosis. Gallbladder stones. Diverticulosis of colon without signs of diverticulitis. EKG: ST depressions in lateral leads V5 and V6 new left bundle branch block.,  ST depressions somewhat improving on repeat 2 hours later Medications Administered: 3500 units heparin bolus, heparin drip, normal saline bolus  Cardiology has been consulted.  PCCM asked to admit for further work-up and treatment.  Pertinent  Medical History   Past Medical History:  Diagnosis Date   Chest pain 12/06/2021   Diabetes mellitus without complication ([place])    Hyperlipidemia    Hypertension    Myocardial infarction ([place])     Micro Data:  7/20: Urine>> 7/20: Blood culture x2>> 7/20: Tracheal aspirate>>  Antimicrobials:  Ceftriaxone 7/20 x1 dose Zosyn 7/20>>  Significant Hospital Events: Including procedures, antibiotic  start and stop dates in addition to other pertinent  events   7/20: Presented to ED, required intubation in ED.  Cardiology consulted for NSTEMI.  PCCM asked to admit  Interim History / Subjective:  -Patient seen and examined in ICU -Sedated, on minimal vent settings, synchronous with the vent, O2 saturations 100% -Hemodynamically stable, no vasopressors   Objective   Blood pressure (!) 120/50, pulse 74, temperature (!) 96.4 F (35.8 C), resp. rate (!) 23, height 4' 11"  (1.499 m), weight 63.7 kg, SpO2 100 %.    Vent Mode: AC FiO2 (%):  [21 %] 21 % Set Rate:  [16 bmp] 16 bmp Vt Set:  [350 mL] 350 mL PEEP:  [5 cmH20] 5 cmH20   Intake/Output Summary (Last 24 hours) at 01/11/2022 0854 Last data filed at 01/11/2022 0802 Gross per 24 hour  Intake 13.13 ml  Output --  Net 13.13 ml   Filed Weights   01/11/22 0658  Weight: 63.7 kg    Examination: General: Acute on chronically ill-appearing female, laying in bed, intubated and sedated, no acute distress HENT: Atraumatic, normocephalic, neck supple, no JVD, orally intubated, NG tube in place draining small amount of bile colored secretion Lungs: Coarse breath sounds bilaterally, synchronous with the vent, even, nonlabored Cardiovascular: Regular rate and rhythm, S1-S2, NSR with occasional PVCs on telemetry, no murmurs, rubs, gallops Abdomen: Soft, nontender, nondistended, no guarding rebound tenderness, bowel sounds positive x4 Extremities: No deformities, no edema Neuro: Sedated, pupils PERRLA at 2 mm bilaterally GU: Foley catheter in place  Resolved Hospital Problem list     Assessment & Plan:   Elevated troponin in setting of N-STEMI vs. demand ischemia Acute on chronic HFpEF PMHx: recent V.fib arrest, CAD s/p CABG x2 with known occluded SVG to OM1, LIMA to LAD patent by [facility] 05/2021, Ischemic Cardiomyopathy -Continuous cardiac monitoring -Maintain MAP >65 -Cautious IVF -Vasopressors as needed to maintain MAP goal -BNP  773 -Trend lactic acid until normalized -HS Troponin peaked at 2206 (peaked at 6300 last admission) -Repeat Echocardiogram (Echo 01/04/22: regional wall abnormalities, normal RV function, moderate MR, mild to moderate TR) -Heparin gtt for 48 hrs -Continue dual antiplatelet therapy with ASA & Plavix -Cardiology consulted, appreciate input -Diuresis as BP and renal function permits ~ holding due to AKI -Hold home antihypertensives for now  Acute Hypoxic Respiratory Failure in setting of acute CHF, altered mental status, and presumed aspiration Intubated for airway protection -Full vent support, implement lung protective strategies -Plateau pressures less than 30 cm H20 -Wean FiO2 & PEEP as tolerated to maintain O2 sats >92% -Follow intermittent Chest X-ray & ABG as needed -Spontaneous Breathing Trials when respiratory parameters met and mental status permits -Implement VAP Bundle -Prn Bronchodilators -Antibiotics as above -Diuresis as blood pressure and renal function permits ~ holding due to AKI  UTI Presumed aspiration -Monitor fever curve -Trend WBC's & Procalcitonin -Follow cultures as above -Continue empiric Zosyn pending cultures & sensitivities  Nausea and vomiting -N.p.o. ~ NG tube to low intermittent suction -CT abdomen with gallstones but no gallbladder wall thickening, diverticulosis without signs of diverticulitis, no obstruction or pneumoperitoneum  AKI on CKD Stage III Hypokalemia -Monitor I&O's / urinary output -Follow BMP -Ensure adequate renal perfusion -Avoid nephrotoxic agents as able -Replace electrolytes as indicated -CT abdomen and pelvis without hydronephrosis or renal or ureteral stones  Acute metabolic encephalopathy in the setting of UTI Sedation needs in the setting of mechanical ventilation -Treat UTI and metabolic derangements as above -Maintain a RASS goal of 0 to -1 -Fentanyl and Versed as needed to maintain  RASS goal -Avoid sedating  medications as able -Daily wake up assessment -CT head on admission is negative for acute abnormality  -Urine drug screen negative  Diabetes mellitus -CBG's q4h; Target range of 140 to 180 -SSI -Follow ICU Hypo/Hyperglycemia protocol -Hemoglobin A1c 6.8 on 12/06/2021     Patient is critically ill, high risk for decompensation, cardiac arrest and death.  Recommend DNR/DNI status.   Best Practice (right click and "Reselect all SmartList Selections" daily)   Diet/type: NPO DVT prophylaxis: systemic heparin GI prophylaxis: PPI Lines: N/A Foley:  Yes, and it is still needed Code Status:  full code Last date of multidisciplinary goals of care discussion [01/11/2022]  Patient's daughter updated at bedside 7/20.  Labs   CBC: Recent Labs  Lab 01/05/22 0644 01/06/22 0513 01/07/22 0438 01/11/22 0719  WBC 11.1* 10.8* 10.2 5.7  NEUTROABS  --   --   --  3.7  HGB 9.6* 8.7* 8.5* 8.9*  HCT 28.9* 26.7* 26.1* 27.8*  MCV 95.1 96.7 97.0 96.2  PLT 218 169 156 716    Basic Metabolic Panel: Recent Labs  Lab 01/06/22 0513 01/07/22 0438 01/08/22 0426 01/09/22 0504 01/11/22 0719  NA 137 132* 133* 132* 134*  K 3.9 4.1 4.1 3.4* 3.0*  CL 103 100 100 99 99  CO2 25 25 24 25 26   GLUCOSE 141* 85 99 99 169*  BUN 27* 39* 47* 44* 48*  CREATININE 1.22* 1.33* 1.39* 1.18* 1.57*  CALCIUM 8.4* 8.2* 8.4* 8.1* 8.8*  MG 2.1 2.1  --   --   --   PHOS 3.6 3.8  --   --   --    GFR: Estimated Creatinine Clearance: 23.2 mL/min (A) (by C-G formula based on SCr of 1.57 mg/dL (H)). Recent Labs  Lab 01/05/22 0644 01/06/22 0513 01/07/22 0438 01/11/22 0719  PROCALCITON 0.10  --   --   --   WBC 11.1* 10.8* 10.2 5.7    Liver Function Tests: Recent Labs  Lab 01/11/22 0719  AST 28  ALT 33  ALKPHOS 66  BILITOT 0.7  PROT 6.5  ALBUMIN 3.3*   No results for input(s): "LIPASE", "AMYLASE" in the last 168 hours. No results for input(s): "AMMONIA" in the last 168 hours.  ABG    Component Value  Date/Time   PHART 7.34 (L) 01/11/2022 0825   PCO2ART 46 01/11/2022 0825   PO2ART 126 (H) 01/11/2022 0825   HCO3 24.8 01/11/2022 0825   ACIDBASEDEF 1.3 01/11/2022 0825   O2SAT 99.9 01/11/2022 0825     Coagulation Profile: Recent Labs  Lab 01/11/22 0719  INR 1.0    Cardiac Enzymes: No results for input(s): "CKTOTAL", "CKMB", "CKMBINDEX", "TROPONINI" in the last 168 hours.  HbA1C: Hgb A1c MFr Bld  Date/Time Value Ref Range Status  12/06/2021 11:42 AM 6.8 (H) 4.8 - 5.6 % Final    Comment:    (NOTE) Pre diabetes:          5.7%-6.4%  Diabetes:              >6.4%  Glycemic control for   <7.0% adults with diabetes   06/01/2021 09:30 AM 6.7 (H) 4.8 - 5.6 % Final    Comment:    (NOTE)         Prediabetes: 5.7 - 6.4         Diabetes: >6.4         Glycemic control for adults with diabetes: <7.0     CBG: Recent Labs  Lab 01/08/22  1932 01/09/22 0723 01/09/22 1111 01/09/22 1451 01/11/22 0652  GLUCAP 184* 105* 196* 117* 182*    Review of Systems:   Unable to assess due to AMS/intubation/sedation/critical illness  Past Medical History:  She,  has a past medical history of Chest pain (12/06/2021), Diabetes mellitus without complication ([place]), Hyperlipidemia, Hypertension, and Myocardial infarction ([place]).   Surgical History:   Past Surgical History:  Procedure Laterality Date   CARDIAC SURGERY     bypass   CORONARY/GRAFT ACUTE MI REVASCULARIZATION N/A 06/01/2021   Procedure: Coronary/Graft Acute MI Revascularization;  Surgeon: Yolonda Kida, MD;  Location: [place] [facility];  Service: Cardiovascular;  Laterality: N/A;   EYE SURGERY     cataract extraction   LEFT HEART CATH AND CORONARY ANGIOGRAPHY N/A 06/01/2021   Procedure: LEFT HEART CATH AND CORONARY ANGIOGRAPHY;  Surgeon: Yolonda Kida, MD;  Location: [place] [facility];  Service: Cardiovascular;  Laterality: N/A;     Social History:   reports that she has quit smoking. Her smoking use included  cigarettes. She has never used smokeless tobacco. She reports that she does not currently use alcohol. She reports that she does not currently use drugs.   Family History:  Her family history includes Congestive Heart Failure in her mother; Diabetes in her father. There is no history of Breast cancer.   Allergies Allergies  Allergen Reactions   Tetanus Toxoids Swelling    Tetanus and diphtheria toxids   Penicillin G Rash     Home Medications  Prior to Admission medications   Medication Sig Start Date End Date Taking? Authorizing Provider  acetaminophen (TYLENOL) 325 MG tablet Take 650 mg by mouth at bedtime.    [provider]  albuterol (VENTOLIN HFA) 108 (90 Base) MCG/ACT inhaler Inhale 1-2 puffs into the lungs every 4 (four) hours as needed for shortness of breath. 08/09/21   [provider]  aspirin 81 MG EC tablet Take by mouth.    [provider]  atorvastatin (LIPITOR) 80 MG tablet Take 80 mg by mouth every evening. 07/06/20   [provider]  Cholecalciferol 25 MCG (1000 UT) capsule Take 1,000 Units by mouth 2 (two) times daily.    [provider]  clopidogrel (PLAVIX) 75 MG tablet Take 1 tablet (75 mg total) by mouth daily. 06/04/21   Loletha Grayer, MD  feeding supplement (ENSURE ENLIVE / ENSURE PLUS) LIQD Take 237 mLs by mouth 3 (three) times daily between meals. 01/09/22   Annita Brod, MD  insulin glargine (LANTUS) 100 UNIT/ML injection Inject 8 Units into the skin at bedtime. 07/24/21   [provider]  insulin lispro (HUMALOG) 100 UNIT/ML KwikPen Inject 0-2 Units into the skin 3 (three) times daily. 07/24/21   [provider]  ipratropium-albuterol (DUONEB) 0.5-2.5 (3) MG/3ML SOLN Take 3 mLs by nebulization every 6 (six) hours as needed (shortness of breath). 08/29/21   [provider]  isosorbide mononitrate (IMDUR) 30 MG 24 hr tablet Take 1 tablet (30 mg total) by mouth daily. 12/09/21   Nicole Kindred  A, DO  lisinopril (ZESTRIL) 20 MG tablet Take 1 tablet (20 mg total) by mouth daily. 12/09/21   Nicole Kindred A, DO  metoprolol succinate (TOPROL-XL) 50 MG 24 hr tablet Take 50 mg by mouth daily. 05/13/21   [provider]  Multiple Vitamin (MULTIVITAMIN WITH MINERALS) TABS tablet Take 1 tablet by mouth daily.    [provider]  nitroGLYCERIN (NITROSTAT) 0.4 MG SL tablet Place 0.4 mg  under the tongue every 5 (five) minutes as needed for chest pain. 09/01/20   [provider]  ondansetron (ZOFRAN-ODT) 4 MG disintegrating tablet Take 4 mg by mouth every 8 (eight) hours as needed. 11/23/21   [provider]  senna (SENOKOT) 8.6 MG tablet Take 2 tablets by mouth at bedtime.    [provider]  SPIRIVA HANDIHALER 18 MCG inhalation capsule Place 1 capsule into inhaler and inhale daily as needed for shortness of breath. 08/09/21   [provider]  spironolactone (ALDACTONE) 25 MG tablet Take 12.5 mg by mouth daily. 09/05/21   [provider]  torsemide (DEMADEX) 20 MG tablet Take 20 mg by mouth daily. 08/31/21   [provider]     Critical care time: 55 minutes     Darel Hong, AGACNP-BC [facility] epic messenger for cross cover needs If after hours, please call E-link

## 2022-01-11 NOTE — Consult Note (Signed)
ANTICOAGULATION CONSULT NOTE  Pharmacy Consult for heparin infusion Indication: chest pain/ACS  Allergies  Allergen Reactions   Tetanus Toxoids Swelling    Tetanus and diphtheria toxids   Penicillin G Rash    Patient Measurements: Height: 4\' 11"  (149.9 cm) Weight: 63.7 kg (140 lb 8 oz) IBW/kg (Calculated) : 43.2 Heparin Dosing Weight: 56.9 kg  Vital Signs: Temp: 98.4 F (36.9 C) (07/20 1900) Temp Source: Bladder (07/20 1600) BP: 115/39 (07/20 1900) Pulse Rate: 52 (07/20 1900)  Labs: Recent Labs    01/09/22 0504 01/11/22 0719 01/11/22 0840  HGB  --  8.9*  --   HCT  --  27.8*  --   PLT  --  364  --   APTT  --   --  32  LABPROT  --  13.4 13.9  INR  --  1.0 1.1  CREATININE 1.18* 1.57*  --   TROPONINIHS 6,335* 2,206* 1,739*     Estimated Creatinine Clearance: 23.2 mL/min (A) (by C-G formula based on SCr of 1.57 mg/dL (H)).   Medical History: Past Medical History:  Diagnosis Date   Chest pain 12/06/2021   Diabetes mellitus without complication ([facility])    Hyperlipidemia    Hypertension    Myocardial infarction ([facility])     Medications:  No history of chronic anticoagulant use PTA  Assessment: Pharmacy has been consulted for heparin infusion in 81yo patient presenting to the ED with AMS, nausea, and shortness of breath. He was recently discharged from hospital after V-fib arrest(brief ICU stay). Also has history of CAD s/p CABG. Baseline labs have been ordered and are pending.  Goal of Therapy:  Heparin level 0.3-0.7 units/ml Monitor platelets by anticoagulation protocol: Yes   Plan:  Heparin drip has been held since 1700 due to vasculature complications per RN. IV team inserted central line and heparin drip restarted at 2015. Continue heparin infusion at 700 units/hr Check anti-Xa level in 8 hours and daily while on heparin Continue to monitor H&H and platelets  12/08/2021 01/11/2022,8:13 PM

## 2022-01-11 NOTE — Progress Notes (Signed)
ETT pulled back 1cm, to 21, per MD based on CXR results

## 2022-01-12 ENCOUNTER — Inpatient Hospital Stay
Admit: 2022-01-12 | Discharge: 2022-01-12 | Disposition: A | Discharge status: Home / Self Care | Payer: Medicare Other | Attending: Cardiology | Admitting: Cardiology

## 2022-01-12 LAB — CBC
HCT: 26.7 % — ABNORMAL LOW (ref 36.0–46.0)
Hemoglobin: 8.6 g/dL — ABNORMAL LOW (ref 12.0–15.0)
MCH: 30.9 pg (ref 26.0–34.0)
MCHC: 32.2 g/dL (ref 30.0–36.0)
MCV: 96 fL (ref 80.0–100.0)
Platelets: 411 10*3/uL — ABNORMAL HIGH (ref 150–400)
RBC: 2.78 MIL/uL — ABNORMAL LOW (ref 3.87–5.11)
RDW: 14 % (ref 11.5–15.5)
WBC: 12 10*3/uL — ABNORMAL HIGH (ref 4.0–10.5)
nRBC: 0 % (ref 0.0–0.2)

## 2022-01-12 LAB — BASIC METABOLIC PANEL
Anion gap: 7 (ref 5–15)
BUN: 46 mg/dL — ABNORMAL HIGH (ref 8–23)
CO2: 28 mmol/L (ref 22–32)
Calcium: 8.4 mg/dL — ABNORMAL LOW (ref 8.9–10.3)
Chloride: 100 mmol/L (ref 98–111)
Creatinine, Ser: 1.75 mg/dL — ABNORMAL HIGH (ref 0.44–1.00)
GFR, Estimated: 29 mL/min — ABNORMAL LOW (ref >60)
Glucose, Bld: 158 mg/dL — ABNORMAL HIGH (ref 70–99)
Potassium: 3.1 mmol/L — ABNORMAL LOW (ref 3.5–5.1)
Sodium: 135 mmol/L (ref 135–145)

## 2022-01-12 LAB — GLUCOSE, CAPILLARY
Glucose-Capillary: 153 mg/dL — ABNORMAL HIGH (ref 70–99)
Glucose-Capillary: 131 mg/dL — ABNORMAL HIGH (ref 70–99)
Glucose-Capillary: 162 mg/dL — ABNORMAL HIGH (ref 70–99)
Glucose-Capillary: 147 mg/dL — ABNORMAL HIGH (ref 70–99)
Glucose-Capillary: 138 mg/dL — ABNORMAL HIGH (ref 70–99)
Glucose-Capillary: 129 mg/dL — ABNORMAL HIGH (ref 70–99)

## 2022-01-12 LAB — PROCALCITONIN: Procalcitonin: 0.32 ng/mL

## 2022-01-12 LAB — PHOSPHORUS: Phosphorus: 4.2 mg/dL (ref 2.5–4.6)

## 2022-01-12 LAB — ECHOCARDIOGRAM LIMITED
Height: 59 in
S' Lateral: 3.4 cm
Weight: 2246.93 oz

## 2022-01-12 LAB — HEPARIN LEVEL (UNFRACTIONATED)
Heparin Unfractionated: 0.34 IU/mL (ref 0.30–0.70)
Heparin Unfractionated: 0.36 IU/mL (ref 0.30–0.70)

## 2022-01-12 LAB — MAGNESIUM: Magnesium: 2.1 mg/dL (ref 1.7–2.4)

## 2022-01-12 MED ORDER — PANTOPRAZOLE 2 MG/ML SUSPENSION
40.0000 mg | Freq: Every day | ORAL | Status: DC
  Administered 2022-01-12 – 2022-01-16 (×5): 40 mg
  Filled 2022-01-12 (×5): qty 20

## 2022-01-12 MED ORDER — POTASSIUM CHLORIDE 10 MEQ/100ML IV SOLN
10.0000 meq | INTRAVENOUS | Status: AC
  Administered 2022-01-12 (×3): 10 meq via INTRAVENOUS
  Filled 2022-01-12 (×3): qty 100

## 2022-01-12 MED ORDER — NOREPINEPHRINE BITARTRATE 1 MG/ML IV SOLN
0.0000 ug/min | INTRAVENOUS | Status: DC
  Filled 2022-01-12 (×2): qty 16

## 2022-01-12 MED ORDER — VITAL AF 1.2 CAL PO LIQD
1000.0000 mL | ORAL | Status: DC
  Administered 2022-01-12 – 2022-01-15 (×3): 1000 mL

## 2022-01-12 MED ORDER — FREE WATER
30.0000 mL | Status: DC
  Administered 2022-01-12 – 2022-01-16 (×23): 30 mL

## 2022-01-12 MED ORDER — PERFLUTREN LIPID MICROSPHERE
1.0000 mL | INTRAVENOUS | Status: AC | PRN
  Administered 2022-01-12: 2 mL via INTRAVENOUS

## 2022-01-12 MED ORDER — NOREPINEPHRINE 16 MG/250ML-% IV SOLN
0.0000 ug/min | INTRAVENOUS | Status: DC
  Administered 2022-01-12: 10 ug/min via INTRAVENOUS
  Administered 2022-01-12: 16 ug/min via INTRAVENOUS
  Filled 2022-01-12 (×5): qty 250

## 2022-01-12 NOTE — Consult Note (Signed)
PHARMACY CONSULT NOTE  Pharmacy Consult for Electrolyte Monitoring and Replacement   Recent Labs: Potassium (mmol/L)  Date Value  01/12/2022 3.1 (L)   Magnesium (mg/dL)  Date Value  77/04/6578 2.1   Calcium (mg/dL)  Date Value  03/83/3383 8.4 (L)   Albumin (g/dL)  Date Value  29/19/1660 3.3 (L)   Phosphorus (mg/dL)  Date Value  60/09/5995 4.2   Sodium (mmol/L)  Date Value  01/12/2022 135   Assessment: Patient is an 81 y/o F with medical history including CAD s/p CABG, HTN, HLD, DM, PAD, CHF (EF 45-50%), CKD-3a, RLS, recent admission 01/03/22 thru 01/09/22 for NSTEMI, Vfib cardiac arrest, AKI now presenting to the ED 7/20 with nausea, vomiting, SOB, lethargy. Patient ultimately required intubation.  Labs notable for elevated troponin, BNP, Scr, hypokalemia  Goal of Therapy:  K >= 4, Mg >= 2 All other electrolytes within normal limits  Plan:  --K 3.1, IV Kcl 10 mEq x 3; replace cautiously given renal dysfunction and instability. Also note recent Vfib arrest so need to monitor closely --No further electrolyte replacement indicated at this time --Re-check potassium at 1500, follow-up electrolytes again with AM labs tomorrow  Lindsey Pollard A 01/12/2022 7:00 AM

## 2022-01-12 NOTE — Progress Notes (Signed)
Nutrition Follow Up Note   DOCUMENTATION CODES:   Non-severe (moderate) malnutrition in context of chronic illness  INTERVENTION:   Vital 1.2@50ml /hr- Initiate at 54ml/hr and increase by 68ml/hr q 8 hours until goal rate is reached.   Free water flushes 65ml q4 hours to maintain tube patency   Regimen provides 1440kcal/day, 90g/day protein and 1146ml/day of free water  Pt at high refeed risk; recommend monitor potassium, magnesium and phosphorus labs daily until stable  NUTRITION DIAGNOSIS:   Moderate Malnutrition related to chronic illness as evidenced by moderate muscle depletion, moderate fat depletion.  GOAL:   Provide needs based on ASPEN/SCCM guidelines -progressing   MONITOR:   Vent status, Labs, Weight trends, Skin, I & O's, TF tolerance  ASSESSMENT:   81 y/o female with h/o CAD s/p CABG x 2, PVD, GERD, CKD III, MI, DM, CHF, HTN, HLD and recent admission for VFib cardiac arrest and who is now admitted with acute metabolic encephalopathy, acute respiratory failure in the setting of acute on chronic HFpEF, elevated troponins (NSTEMI versus demand ischemia), UTI and presumed aspiration requiring intubation for airway protection.  Pt remains sedated and ventilated. OGT in place to LIS with 74ml output. Will plan to initiate tube feeds today.   Medications reviewed and include: aspirin, plavix, colace, insulin, protonix, miralax, heparin, levophed, zosyn, KCl, vasopressin   Labs reviewed: K 3.1(L), BUN 46(H), creat 1.75(H), P 4.2 wnl, Mg 2.1 wnl Wbc- 12.0(H), Hgb 8.6(L), Hct 26.7(L) Cbgs- 162, 131, 153 x 24 hrs  Patient is currently intubated on ventilator support MV: 6.0 L/min Temp (24hrs), Avg:98.6 F (37 C), Min:97.3 F (36.3 C), Max:99.1 F (37.3 C)  Propofol: none   MAP- >21mmHg   UOP-   Diet Order:   Diet Order             Diet NPO time specified  Diet effective now                  EDUCATION NEEDS:   No education needs have been  identified at this time  Skin:  Skin Assessment: Reviewed RN Assessment (ecchymosis)  Last BM:  pta  Height:   Ht Readings from Last 1 Encounters:  01/11/22 4\' 11"  (1.499 m)    Weight:   Wt Readings from Last 1 Encounters:  01/12/22 63.7 kg    Ideal Body Weight:  44.5 kg  BMI:  Body mass index is 28.36 kg/m.  Estimated Nutritional Needs:   Kcal:  1154kcal/day  Protein:  80-90g/day  Fluid:  1.2-1.4L/day  01/14/22 MS, RD, LDN Please refer to [facility] for RD and/or RD on-call/weekend/after hours pager

## 2022-01-12 NOTE — Progress Notes (Signed)
NAME:  Lindsey Pollard, MRN:  [id number], DOB:  1941-05-30, LOS: 1 ADMISSION DATE:  01/11/2022  Brief Pt Description / Synopsis:  81 year old female admitted with acute metabolic encephalopathy and acute respiratory failure in the setting of acute on chronic HFpEF, elevated troponins (NSTEMI versus demand ischemia), UTI and presumed aspiration.  intubation for airway protection and multiorgan failure   History of Present Illness:  81 year old female with a past medical history significant for recent V-fib arrest, HFpEF, ischemic cardiomyopathy, CAD status post CABG x2 in 2018 and PCI, chronic kidney disease stage III, diabetes mellitus who presents to [facility] ED on 01/11/2022 due to altered mental status, respiratory distress, and nausea/vomiting.  Patient is currently intubated and sedated, therefore history is obtained from patient's daughter at bedside along with chart review.   Per the patient's daughter, the patient was recently admitted from 7/12 through 7/18 for brief cardiac arrest in the setting of NSTEMI and acute CHF, along with acute kidney injury.  Her mother was discharged home with PT/OT and cardiac rehab.  However since discharge her mother has been severely weak, very sleepy (more than normal), with poor p.o. intake.  Earlier this morning around the patient was very altered and confused/ "not acting right",vomiting (nonbloody), and appeared short of breath.   In the ED she was ill appearing but not hypoxic.  She was complaining she could not breathe, and subsequently was intubated for airway protection.   ED Course: Initial Vital Signs: Temperature 97.5 orally, respiratory rate 12, pulse 55, blood pressure 132/44, SPO2 100% on 2 L nasal cannula Significant Labs: Sodium 134, potassium 3.0, glucose 169, BUN 48, creatinine 1.57, high-sensitivity troponin 2206, BNP 778, hemoglobin 8.9, hematocrit 27.8, lactic acid 1.4 Post intubation ABG: pH 7.34/PCO2 46/PO2 126/bicarb 24.8 Urinalysis is  concerning for UTI (large leukocytes, greater than 50 WBC) Imaging Chest X-ray>>FINDINGS: Prior median sternotomy/CABG. Heart size within normal limits. Aortic atherosclerosis. No appreciable airspace consolidation or pulmonary edema. No evidence of pleural effusion or pneumothorax. No acute bony abnormality identified. CT head without contrast>>IMPRESSION: No acute intracranial findings are seen.  Atrophy. CT chest/abdomen/pelvis>>IMPRESSION: Tip of endotracheal tube is at the carina and should be pulled back 2-3 cm. There is interval appearance of small bilateral pleural effusions, more so on the left side. There is thickening of interlobular septi suggesting possible interstitial pulmonary edema. There are patchy densities in the left parahilar region and both lower lung fields which may be due to pulmonary edema or pneumonia. Extensive coronary artery calcifications are seen. There is no evidence of intestinal obstruction or pneumoperitoneum. There is no hydronephrosis. Gallbladder stones. Diverticulosis of colon without signs of diverticulitis. EKG: ST depressions in lateral leads V5 and V6 new left bundle branch block.,  ST depressions somewhat improving on repeat 2 hours later Medications Administered: 3500 units heparin bolus, heparin drip, normal saline bolus   Cardiology has been consulted.  PCCM asked to admit for further work-up and treatment.   Pertinent  Medical History        Past Medical History:  Diagnosis Date   Chest pain 12/06/2021   Diabetes mellitus without complication ([place])     Hyperlipidemia     Hypertension     Myocardial infarction ([place])        Micro Data:  7/20: Urine>> 7/20: Blood culture x2>> 7/20: Tracheal aspirate>>   Antimicrobials:  Ceftriaxone 7/20 x1 dose Zosyn 7/20>>   Significant Hospital Events: Including procedures, antibiotic start and stop dates in addition to other pertinent events  7/20: Presented to ED, required intubation in  ED.  Cardiology consulted for NSTEMI.  PCCM asked to admit 7/21 multiorgan failure, pressors, poor prognosis         Interim History / Subjective:  Severe shock Multiorgan failure DNR status Remains critically ill      Objective   Blood pressure (!) 117/37, pulse (!) 51, temperature 98.8 F (37.1 C), resp. rate 16, height 4\' 11"  (1.499 m), weight 63.7 kg, SpO2 98 %.    Vent Mode: PRVC FiO2 (%):  [21 %-30 %] 24 % Set Rate:  [16 bmp] 16 bmp Vt Set:  [350 mL] 350 mL PEEP:  [5 cmH20] 5 cmH20 Plateau Pressure:  [15 cmH20] 15 cmH20   Intake/Output Summary (Last 24 hours) at 01/12/2022 01/14/2022 Last data filed at 01/12/2022 0617 Gross per 24 hour  Intake 1293.77 ml  Output 250 ml  Net 1043.77 ml   Filed Weights   01/11/22 0658 01/12/22 0456  Weight: 63.7 kg 63.7 kg     REVIEW OF SYSTEMS  PATIENT IS UNABLE TO PROVIDE COMPLETE REVIEW OF SYSTEMS DUE TO SEVERE CRITICAL ILLNESS    PHYSICAL EXAMINATION:  GENERAL:critically ill appearing, +resp distress EYES: Pupils equal, round, reactive to light.  No scleral icterus.  MOUTH: Moist mucosal membrane. INTUBATED NECK: Supple.  PULMONARY: +rhonchi, +wheezing CARDIOVASCULAR: S1 and S2.  No murmurs  GASTROINTESTINAL: Soft, nontender, -distended. Positive bowel sounds.  MUSCULOSKELETAL: No swelling, clubbing, or edema.  NEUROLOGIC: obtunded SKIN:intact,warm,dry    Labs/imaging that I havepersonally reviewed  (right click and "Reselect all SmartList Selections" daily)     ASSESSMENT AND PLAN SYNOPSIS  81 yo white female admitted for severe aspiration pneumonia with septic/cardiogenic shock due to NSTEMI leading to severe hypoxic and hypercapnic respiratory  and renal failure  Severe ACUTE Hypoxic and Hypercapnic Respiratory Failure -continue Mechanical Ventilator support -continue Bronchodilator Therapy -Wean Fio2 and PEEP as tolerated -VAP/VENT bundle implementation -will NOT perform SAT/SBT  Vent Mode: PRVC FiO2  (%):  [21 %-30 %] 24 % Set Rate:  [16 bmp] 16 bmp Vt Set:  [350 mL] 350 mL PEEP:  [5 cmH20] 5 cmH20 Plateau Pressure:  [15 cmH20] 15 cmH20    CARDIAC FAILURE-NSTEMI -oxygen as needed -Lasix as tolerated -follow up cardiac enzymes as indicated Follow up cardiology recs   CARDIAC ICU monitoring   ACUTE KIDNEY INJURY/Renal Failure -continue Foley Catheter-assess need -Avoid nephrotoxic agents -Follow urine output, BMP -Ensure adequate renal perfusion, optimize oxygenation -Renal dose medications   Intake/Output Summary (Last 24 hours) at 01/12/2022 01/14/2022 Last data filed at 01/12/2022 0617 Gross per 24 hour  Intake 1293.77 ml  Output 250 ml  Net 1043.77 ml     NEUROLOGY Acute  metabolic encephalopathy, need for sedation Goal RASS -2 to -3   SHOCK SOURCE-SEPTIC/CARDIOGENIC -use vasopressors to keep MAP>65 as needed -follow ABG and LA -follow up cultures -emperic ABX -consider stress dose steroids   INFECTIOUS DISEASE -continue antibiotics as prescribed -follow up cultures  ENDO - ICU hypoglycemic\Hyperglycemia protocol -check FSBS per protocol   GI GI PROPHYLAXIS as indicated  NUTRITIONAL STATUS DIET-->TF's as tolerated Constipation protocol as indicated   ELECTROLYTES -follow labs as needed -replace as needed -pharmacy consultation and following     Best practice (right click and "Reselect all SmartList Selections" daily)  Diet:  NPO Pain/Anxiety/Delirium protocol (if indicated): Yes (RASS goal -2) VAP protocol (if indicated): Yes DVT prophylaxis: Systemic AC GI prophylaxis: PPI Central venous access:  Yes, and it is still needed Foley:  Yes,  and it is still needed Mobility:  bed rest  Code Status:  FULL CODE Disposition: ICU  Labs   CBC: Recent Labs  Lab 01/05/22 0644 01/06/22 0513 01/07/22 0438 01/11/22 0719 01/12/22 0415  WBC 11.1* 10.8* 10.2 5.7 12.0*  NEUTROABS  --   --   --  3.7  --   HGB 9.6* 8.7* 8.5* 8.9* 8.6*  HCT  28.9* 26.7* 26.1* 27.8* 26.7*  MCV 95.1 96.7 97.0 96.2 96.0  PLT 218 169 156 364 411*    Basic Metabolic Panel: Recent Labs  Lab 01/06/22 0513 01/07/22 0438 01/08/22 0426 01/09/22 0504 01/11/22 0713 01/11/22 0719 01/11/22 1645 01/12/22 0415  NA 137 132* 133* 132*  --  134*  --  135  K 3.9 4.1 4.1 3.4*  --  3.0* 3.6 3.1*  CL 103 100 100 99  --  99  --  100  CO2 25 25 24 25   --  26  --  28  GLUCOSE 141* 85 99 99  --  169*  --  158*  BUN 27* 39* 47* 44*  --  48*  --  46*  CREATININE 1.22* 1.33* 1.39* 1.18*  --  1.57*  --  1.75*  CALCIUM 8.4* 8.2* 8.4* 8.1*  --  8.8*  --  8.4*  MG 2.1 2.1  --   --  2.4  --   --  2.1  PHOS 3.6 3.8  --   --   --   --   --  4.2   GFR: Estimated Creatinine Clearance: 20.8 mL/min (A) (by C-G formula based on SCr of 1.75 mg/dL (H)). Recent Labs  Lab 01/05/22 0644 01/06/22 0513 01/07/22 0438 01/11/22 0719 01/11/22 0840 01/11/22 1049 01/12/22 0415  PROCALCITON 0.10  --   --   --  0.46  --  0.32  WBC 11.1* 10.8* 10.2 5.7  --   --  12.0*  LATICACIDVEN  --   --   --   --  1.4 2.3*  --     Liver Function Tests: Recent Labs  Lab 01/11/22 0719  AST 28  ALT 33  ALKPHOS 66  BILITOT 0.7  PROT 6.5  ALBUMIN 3.3*   No results for input(s): "LIPASE", "AMYLASE" in the last 168 hours. No results for input(s): "AMMONIA" in the last 168 hours.  ABG    Component Value Date/Time   PHART 7.34 (L) 01/11/2022 0825   PCO2ART 46 01/11/2022 0825   PO2ART 126 (H) 01/11/2022 0825   HCO3 24.8 01/11/2022 0825   ACIDBASEDEF 1.3 01/11/2022 0825   O2SAT 99.9 01/11/2022 0825     Coagulation Profile: Recent Labs  Lab 01/11/22 0719 01/11/22 0840  INR 1.0 1.1    Cardiac Enzymes: No results for input(s): "CKTOTAL", "CKMB", "CKMBINDEX", "TROPONINI" in the last 168 hours.  HbA1C: Hgb A1c MFr Bld  Date/Time Value Ref Range Status  12/06/2021 11:42 AM 6.8 (H) 4.8 - 5.6 % Final    Comment:    (NOTE) Pre diabetes:          5.7%-6.4%  Diabetes:               >6.4%  Glycemic control for   <7.0% adults with diabetes   06/01/2021 09:30 AM 6.7 (H) 4.8 - 5.6 % Final    Comment:    (NOTE)         Prediabetes: 5.7 - 6.4         Diabetes: >6.4  Glycemic control for adults with diabetes: <7.0     CBG: Recent Labs  Lab 01/11/22 1143 01/11/22 1559 01/11/22 2029 01/11/22 2334 01/12/22 0337  GLUCAP 182* 132* 140* 167* 153*    Allergies Allergies  Allergen Reactions   Tetanus Toxoids Swelling    Tetanus and diphtheria toxids   Penicillin G Rash       DVT/GI PRX  assessed I Assessed the need for Labs I Assessed the need for Foley I Assessed the need for Central Venous Line Family Discussion when available I Assessed the need for Mobilization I made an Assessment of medications to be adjusted accordingly Safety Risk assessment completed  CASE DISCUSSED IN MULTIDISCIPLINARY ROUNDS WITH ICU TEAM     Critical Care Time devoted to patient care services described in this note is 55 minutes.  Critical care was necessary to treat or prevent imminent or life-threatening deterioration.   PATIENT WITH VERY POOR PROGNOSIS I ANTICIPATE PROLONGED ICU LOS  Patient with Multiorgan failure and at high risk for cardiac arrest and death.    Lucie Leather, M.D.  [facility]  Medical Director [facility] Medical Director [facility] Cardio-Pulmonary Department

## 2022-01-12 NOTE — Progress Notes (Signed)
*  PRELIMINARY RESULTS* Echocardiogram 2D Echocardiogram has been performed.  Cristela Blue 01/12/2022, 2:13 PM

## 2022-01-12 NOTE — Consult Note (Signed)
ANTICOAGULATION CONSULT NOTE  Pharmacy Consult for heparin infusion Indication: chest pain/ACS  Allergies  Allergen Reactions   Tetanus Toxoids Swelling    Tetanus and diphtheria toxids   Penicillin G Rash    Patient Measurements: Height: 4\' 11"  (149.9 cm) Weight: 63.7 kg (140 lb 6.9 oz) IBW/kg (Calculated) : 43.2 Heparin Dosing Weight: 56.9 kg  Vital Signs: Temp: 98.8 F (37.1 C) (07/21 0600) BP: 117/37 (07/21 0600) Pulse Rate: 51 (07/21 0600)  Labs: Recent Labs    01/11/22 0719 01/11/22 0840 01/12/22 0415  HGB 8.9*  --  8.6*  HCT 27.8*  --  26.7*  PLT 364  --  411*  APTT  --  32  --   LABPROT 13.4 13.9  --   INR 1.0 1.1  --   HEPARINUNFRC  --   --  0.34  CREATININE 1.57*  --  1.75*  TROPONINIHS 2,206* 1,739*  --      Estimated Creatinine Clearance: 20.8 mL/min (A) (by C-G formula based on SCr of 1.75 mg/dL (H)).   Medical History: Past Medical History:  Diagnosis Date   Chest pain 12/06/2021   Diabetes mellitus without complication ([facility])    Hyperlipidemia    Hypertension    Myocardial infarction ([facility])     Medications:  No history of chronic anticoagulant use PTA  Assessment: Pharmacy has been consulted for heparin infusion in 81yo patient presenting to the ED with AMS, nausea, and shortness of breath. He was recently discharged from hospital after V-fib arrest(brief ICU stay). Also has history of CAD s/p CABG.   Goal of Therapy:  Heparin level 0.3-0.7 units/ml Monitor platelets by anticoagulation protocol: Yes   Plan:  Heparin level remains therapeutic. This is the second consecutive therapeutic heparin level Continue heparin infusion at 700 units/hr until completion of 48 hours therapy (per cardiology recommendations) Check next anti-Xa level in am Continue to monitor H&H and platelets  12/08/2021 01/12/2022,7:12 AM

## 2022-01-12 NOTE — Consult Note (Signed)
ANTICOAGULATION CONSULT NOTE  Pharmacy Consult for heparin infusion Indication: chest pain/ACS  Allergies  Allergen Reactions   Tetanus Toxoids Swelling    Tetanus and diphtheria toxids   Penicillin G Rash    Patient Measurements: Height: 4\' 11"  (149.9 cm) Weight: 63.7 kg (140 lb 6.9 oz) IBW/kg (Calculated) : 43.2 Heparin Dosing Weight: 56.9 kg  Vital Signs: Temp: 98.6 F (37 C) (07/21 0500) BP: 120/36 (07/21 0500) Pulse Rate: 54 (07/21 0500)  Labs: Recent Labs    01/11/22 0719 01/11/22 0840 01/12/22 0415  HGB 8.9*  --  8.6*  HCT 27.8*  --  26.7*  PLT 364  --  411*  APTT  --  32  --   LABPROT 13.4 13.9  --   INR 1.0 1.1  --   HEPARINUNFRC  --   --  0.34  CREATININE 1.57*  --  1.75*  TROPONINIHS 2,206* 1,739*  --      Estimated Creatinine Clearance: 20.8 mL/min (A) (by C-G formula based on SCr of 1.75 mg/dL (H)).   Medical History: Past Medical History:  Diagnosis Date   Chest pain 12/06/2021   Diabetes mellitus without complication ([facility])    Hyperlipidemia    Hypertension    Myocardial infarction ([facility])     Medications:  No history of chronic anticoagulant use PTA  Assessment: Pharmacy has been consulted for heparin infusion in 81yo patient presenting to the ED with AMS, nausea, and shortness of breath. He was recently discharged from hospital after V-fib arrest(brief ICU stay). Also has history of CAD s/p CABG. Baseline labs have been ordered and are pending.  7/21  0415  HL=0.34  therapeutic, cont@700  u/hr  Goal of Therapy:  Heparin level 0.3-0.7 units/ml Monitor platelets by anticoagulation protocol: Yes   Plan:  7/21  0415  HL=0.34  therapeutic Continue heparin infusion at 700 units/hr Check confirmatory anti-Xa level in 8 hours and daily while on heparin Continue to monitor H&H and platelets  Canuto Kingston A 01/12/2022,5:17 AM

## 2022-01-12 NOTE — Progress Notes (Signed)
Lindsey Pollard MRN: [id number] DOB/AGE: Feb 22, 1941 81 y.o.  Admit date: 01/11/2022 Referring Physician Dr. Merlyn Lot Primary Physician Dr. [name]  Primary Cardiologist Dr. Clayborn Bigness Reason for Consultation NSTEMI  HPI: Lindsey Pollard is an 76yoF with a PMH of CAD s/p CABG x2 in 2018 (patent LIMA to LAD,  occluded SVG to OM1 by [facility] 05/2021), h/o PCI x1 distal RCA (50% ISR by [facility] 05/2021), ischemic CM with LVEF with moderate hypo of LV mid apical anterolateral wall, 55-60%, g1dd, mod MR 12/2021, CKD 3, hyperlipidemia, type 2 diabetes, CAD, GERD who was recently hospitalized at [facility] from 7/12 - 7/18 initially for shortness of breath and developed chest pain, VF arrest with successful resuscitation and her NSTEMI was treated medically.  She presents to [facility] ED 2 days after discharge with acute encephalopathy, nausea, vomiting, and was intubated for airway protection in the ED. cardiology was consulted due to her significant cardiac history and concern for NSTEMI.  Interval History:  -Remains intubated and sedated, requiring norepi and vasopressin for vasopressor support -No family at bedside  -Renal function worsening overnight and new leukocytosis today.   Review of systems limited by sedation   Past Medical History:  Diagnosis Date   Chest pain 12/06/2021   Diabetes mellitus without complication ([place])    Hyperlipidemia    Hypertension    Myocardial infarction [facility])     Past Surgical History:  Procedure Laterality Date   CARDIAC SURGERY     bypass   CORONARY/GRAFT ACUTE MI REVASCULARIZATION N/A 06/01/2021   Procedure: Coronary/Graft Acute MI Revascularization;  Surgeon: Yolonda Kida, MD;  Location: [place] [facility];  Service: Cardiovascular;  Laterality: N/A;   EYE SURGERY     cataract extraction   LEFT HEART CATH AND CORONARY ANGIOGRAPHY N/A 06/01/2021   Procedure: LEFT HEART CATH AND CORONARY  ANGIOGRAPHY;  Surgeon: Yolonda Kida, MD;  Location: [place] [facility];  Service: Cardiovascular;  Laterality: N/A;    Medications Prior to Admission  Medication Sig Dispense Refill Last Dose   acetaminophen (TYLENOL) 325 MG tablet Take 650 mg by mouth at bedtime.   PRN at PRN   albuterol (VENTOLIN HFA) 108 (90 Base) MCG/ACT inhaler Inhale 1-2 puffs into the lungs every 4 (four) hours as needed for shortness of breath.      aspirin 81 MG EC tablet Take by mouth.      atorvastatin (LIPITOR) 80 MG tablet Take 80 mg by mouth every evening.      baclofen (LIORESAL) 10 MG tablet Take 10 mg by mouth 3 (three) times daily.      Cholecalciferol 25 MCG (1000 UT) capsule Take 1,000 Units by mouth 2 (two) times daily.      clopidogrel (PLAVIX) 75 MG tablet Take 1 tablet (75 mg total) by mouth daily. 30 tablet 0    feeding supplement (ENSURE ENLIVE / ENSURE PLUS) LIQD Take 237 mLs by mouth 3 (three) times daily between meals. 237 mL 12    insulin glargine (LANTUS) 100 UNIT/ML injection Inject 8 Units into the skin at bedtime.      insulin lispro (HUMALOG) 100 UNIT/ML KwikPen Inject 0-2 Units into the skin 3 (three) times daily.      ipratropium-albuterol (DUONEB) 0.5-2.5 (3) MG/3ML SOLN Take 3 mLs by nebulization every 6 (six) hours as needed (shortness of breath).      isosorbide mononitrate (IMDUR) 30 MG 24 hr tablet Take  1 tablet (30 mg total) by mouth daily. 30 tablet 1    lisinopril (ZESTRIL) 20 MG tablet Take 1 tablet (20 mg total) by mouth daily. 30 tablet 1    lisinopril-hydrochlorothiazide (ZESTORETIC) 10-12.5 MG tablet Take 1 tablet by mouth daily.      metoprolol succinate (TOPROL-XL) 50 MG 24 hr tablet Take 50 mg by mouth daily.      Multiple Vitamin (MULTIVITAMIN WITH MINERALS) TABS tablet Take 1 tablet by mouth daily.      nitroGLYCERIN (NITROSTAT) 0.4 MG SL tablet Place 0.4 mg under the tongue every 5 (five) minutes as needed for chest pain.      ondansetron (ZOFRAN-ODT) 4 MG  disintegrating tablet Take 4 mg by mouth every 8 (eight) hours as needed.      senna (SENOKOT) 8.6 MG tablet Take 2 tablets by mouth at bedtime.      SPIRIVA HANDIHALER 18 MCG inhalation capsule Place 1 capsule into inhaler and inhale daily as needed for shortness of breath.      spironolactone (ALDACTONE) 25 MG tablet Take 12.5 mg by mouth daily.      torsemide (DEMADEX) 20 MG tablet Take 20 mg by mouth daily.      Social History   Socioeconomic History   Marital status: Divorced    Spouse name: Not on file   Number of children: Not on file   Years of education: Not on file   Highest education level: Not on file  Occupational History   Not on file  Tobacco Use   Smoking status: Former    Types: Cigarettes   Smokeless tobacco: Never  Substance and Sexual Activity   Alcohol use: Not Currently   Drug use: Not Currently   Sexual activity: Not on file  Other Topics Concern   Not on file  Social History Narrative   Not on file   Social Determinants of Health   Financial Resource Strain: Not on file  Food Insecurity: Not on file  Transportation Needs: Not on file  Physical Activity: Not on file  Stress: Not on file  Social Connections: Not on file  Intimate Partner Violence: Not on file    Family History  Problem Relation Age of Onset   Congestive Heart Failure Mother    Diabetes Father    Breast cancer Neg Hx       PHYSICAL EXAM General: Elderly and chronically ill-appearing Caucasian female intubated lying in ICU bed with daughter and son-in-law at bedside  HEENT:  Normocephalic and atraumatic. Neck:  No JVD.  Lungs: Mechanically ventilated breath sounds, coarse to auscultation anteriorly Heart: HRRR . Normal S1 and S2 with 3/6 systolic murmur heard throughout.  Radial pulses nonpalpable Abdomen: Non-distended appearing.  Msk: Normal strength and tone for age. Extremities: Warm and well perfused. No clubbing, cyanosis.  No peripheral edema.  Neuro: Intubated and  sedated, unable to assess Psych: Intubated and sedated, unable to assess  Labs:   Lab Results  Component Value Date   WBC 12.0 (H) 01/12/2022   HGB 8.6 (L) 01/12/2022   HCT 26.7 (L) 01/12/2022   MCV 96.0 01/12/2022   PLT 411 (H) 01/12/2022    Recent Labs  Lab 01/11/22 0719 01/11/22 1645 01/12/22 0415  NA 134*  --  135  K 3.0*   < > 3.1*  CL 99  --  100  CO2 26  --  28  BUN 48*  --  46*  CREATININE 1.57*  --  1.75*  CALCIUM 8.8*  --  8.4*  PROT 6.5  --   --   BILITOT 0.7  --   --   ALKPHOS 66  --   --   ALT 33  --   --   AST 28  --   --   GLUCOSE 169*  --  158*   < > = values in this interval not displayed.    No results found for: "CKTOTAL", "CKMB", "CKMBINDEX", "TROPONINI"  Lab Results  Component Value Date   CHOL 102 12/07/2021   CHOL 127 06/01/2021   Lab Results  Component Value Date   HDL 31 (L) 12/07/2021   HDL 47 06/01/2021   Lab Results  Component Value Date   LDLCALC 45 12/07/2021   LDLCALC 49 06/01/2021   Lab Results  Component Value Date   TRIG 101 01/04/2022   TRIG 128 12/07/2021   TRIG 154 (H) 06/01/2021   Lab Results  Component Value Date   CHOLHDL 3.3 12/07/2021   CHOLHDL 2.7 06/01/2021   No results found for: "LDLDIRECT"    Radiology: [facility] Chest Port 1 View  Result Date: 01/11/2022 CLINICAL DATA:  Central line placement. EXAM: PORTABLE CHEST 1 VIEW COMPARISON:  Radiograph and CT earlier today. FINDINGS: New left internal jugular central venous catheter tip overlies the atrial caval junction. No pneumothorax. Stable endotracheal and enteric tubes. Stable heart size and mediastinal contours. Small bilateral pleural effusions, increasing on the left. No pulmonary edema or new airspace disease. IMPRESSION: 1. New left internal jugular central venous catheter with tip over the atrial caval junction. No pneumothorax. 2. Stable endotracheal and enteric tubes. 3. Small bilateral pleural effusions, increasing on the left. Electronically Signed    By: Keith Rake M.D.   On: 01/11/2022 18:41   CT HEAD WO CONTRAST (5MM)  Result Date: 01/11/2022 CLINICAL DATA:  Altered mental status EXAM: CT HEAD WITHOUT CONTRAST TECHNIQUE: Contiguous axial images were obtained from the base of the skull through the vertex without intravenous contrast. RADIATION DOSE REDUCTION: This exam was performed according to the departmental dose-optimization program which includes automated exposure control, adjustment of the mA and/or kV according to patient size and/or use of iterative reconstruction technique. COMPARISON:  01/03/2022 FINDINGS: Brain: No acute intracranial findings are seen in noncontrast CT brain. There are no signs of bleeding within the cranium. Cortical sulci are prominent. Vascular: Unremarkable. Skull: Unremarkable Sinuses/Orbits: Unremarkable. Other: None. IMPRESSION: No acute intracranial findings are seen.  Atrophy. Electronically Signed   By: Elmer Picker M.D.   On: 01/11/2022 08:28   CT CHEST ABDOMEN PELVIS WO CONTRAST  Result Date: 01/11/2022 CLINICAL DATA:  Shortness of breath, altered mental status EXAM: CT CHEST, ABDOMEN AND PELVIS WITHOUT CONTRAST TECHNIQUE: Multidetector CT imaging of the chest, abdomen and pelvis was performed following the standard protocol without IV contrast. RADIATION DOSE REDUCTION: This exam was performed according to the departmental dose-optimization program which includes automated exposure control, adjustment of the mA and/or kV according to patient size and/or use of iterative reconstruction technique. COMPARISON:  01/03/2022 FINDINGS: CT CHEST FINDINGS Cardiovascular: Extensive coronary artery calcifications are seen. There is evidence of previous coronary bypass surgery. Scattered calcifications are seen in thoracic aorta and its major branches. Mediastinum/Nodes: Slightly enlarged lymph nodes are seen in mediastinum with no significant interval change. Tip of endotracheal tube is at the carina and  should be pulled back 2-3 cm. Lungs/Pleura: Small patchy infiltrates are seen in left parahilar region and posterior lower lung fields. There is interval appearance of small bilateral pleural  effusions. There is no pneumothorax. There is slight thickening of interlobular septi. Musculoskeletal: No acute findings are seen. CT ABDOMEN PELVIS FINDINGS Hepatobiliary: No focal abnormalities are seen in liver. There is no dilation of bile ducts. Gallbladder is distended, possibly due to fasting state. There is no wall thickening in gallbladder. Gallbladder stones are seen. Pancreas: No focal abnormalities are seen. Diverticulum is noted along the inner margin of second portion of duodenum. Spleen: Unremarkable. Adrenals/Urinary Tract: Adrenals are unremarkable. Left kidney is much smaller than right. This may be a congenital variation or suggest renal artery stenosis or chronic pyelonephritis. There is no hydronephrosis. There are no renal or ureteral stones. Foley catheter is seen in the bladder. Stomach/Bowel: Tip of enteric tube is seen in the stomach. There is no dilation of small bowel loops. Appendix is unremarkable. There is no significant wall thickening in colon. Scattered diverticula are seen in the colon without signs of focal acute diverticulitis. Vascular/Lymphatic: Extensive arterial calcifications are seen. Reproductive: Coarse calcifications seen in pelvis may suggest calcified uterine fibroids. Other: There is no ascites or pneumoperitoneum. Musculoskeletal: Degenerative changes are noted in lumbar spine, particularly severe at L2-L3 and L3-L4 levels. This finding has not changed significantly. IMPRESSION: Tip of endotracheal tube is at the carina and should be pulled back 2-3 cm. There is interval appearance of small bilateral pleural effusions, more so on the left side. There is thickening of interlobular septi suggesting possible interstitial pulmonary edema. There are patchy densities in the left  parahilar region and both lower lung fields which may be due to pulmonary edema or pneumonia. Extensive coronary artery calcifications are seen. There is no evidence of intestinal obstruction or pneumoperitoneum. There is no hydronephrosis. Gallbladder stones. Diverticulosis of colon without signs of diverticulitis. Other findings as described in the body of the report. Electronically Signed   By: Elmer Picker M.D.   On: 01/11/2022 08:27   DG Abd Portable 1 View  Result Date: 01/11/2022 CLINICAL DATA:  OG tube placement. EXAM: PORTABLE ABDOMEN - 1 VIEW COMPARISON:  AP chest 01/11/2022 at 704 hours FINDINGS: New orogastric tube with side port overlying the proximal stomach and tip overlying the mid aspect of the greater curvature. Nonobstructed upper abdominal bowel-gas pattern. No portal venous gas or pneumatosis. Severe left L3-4 disc space narrowing and moderate to severe diffuse L4-5 disc space narrowing. Mild dextrocurvature centered at L3-4. IMPRESSION: Orogastric tube in appropriate position. Electronically Signed   By: Yvonne Kendall M.D.   On: 01/11/2022 08:11   DG Chest Portable 1 View  Result Date: 01/11/2022 CLINICAL DATA:  Tube placement.  Encounter for intubation. EXAM: PORTABLE CHEST 1 VIEW COMPARISON:  AP chest 01/11/2022 at 704 hours FINDINGS: AP chest 01/11/2022 at 735 hours. Interval placement of endotracheal tube with tip terminating imaging of the thoracic inlet and the carina proximally 3.2 cm above the carina. Enteric tube descends below the diaphragm with the side port overlying the proximal stomach and the tip excluded by inferior collimation, new from prior. Postsurgical changes are again seen of median sternotomy and CABG. Cardiac silhouette and mediastinal contours are within normal limits. Moderate calcification within the aortic arch. Mild left basilar linear likely subsegmental atelectasis. No definite pleural effusion. No pneumothorax is seen. No acute skeletal  abnormality. IMPRESSION: 1. New endotracheal tube and enteric tube in appropriate position. 2. No acute lung process. 3.  Aortic Atherosclerosis (ICD10-I70.0). Electronically Signed   By: Yvonne Kendall M.D.   On: 01/11/2022 08:10   DG Chest Portable 1  View  Result Date: 01/11/2022 CLINICAL DATA:  Provided history: New onset dyspnea, recent cardiac event. EXAM: PORTABLE CHEST 1 VIEW COMPARISON:  Prior chest radiographs 01/04/2022 and earlier. FINDINGS: Prior median sternotomy/CABG. Heart size within normal limits. Aortic atherosclerosis. No appreciable airspace consolidation or pulmonary edema. No evidence of pleural effusion or pneumothorax. No acute bony abnormality identified. IMPRESSION: No evidence of acute cardiopulmonary abnormality. Aortic Atherosclerosis (ICD10-I70.0). Electronically Signed   By: Jackey Loge D.O.   On: 01/11/2022 08:03   ECHOCARDIOGRAM LIMITED  Result Date: 01/04/2022    ECHOCARDIOGRAM LIMITED REPORT   Patient Name:   Lindsey Pollard [facility] Date of Exam: 01/04/2022 Medical Rec #:  [id number]     Height:       59.0 in Accession #:    [id number]    Weight:       127.4 lb Date of Birth:  07-Aug-1940     BSA:          1.523 m Patient Age:    81 years      BP:           128/50 mmHg Patient Gender: F             HR:           54 bpm. Exam Location:  [facility] Procedure: Limited Echo, Color Doppler and Cardiac Doppler Indications:     Cardiac Areest I46.9  History:         Patient has prior history of Echocardiogram examinations, most                  recent 12/07/2021. Previous Myocardial Infarction,                  Signs/Symptoms:Chest Pain; Risk Factors:Diabetes and                  Hypertension.  Sonographer:     Cristela Blue Referring Phys:  [id number] Tonna Corner MICHELLE Surena Welge Diagnosing Phys: Arnoldo Hooker MD  Sonographer Comments: Image acquisition challenging due to patient behavioral factors. IMPRESSIONS  1. The left ventricle demonstrates regional wall motion abnormalities (see scoring diagram/findings for  description).  2. Right ventricular systolic function is normal. The right ventricular size is normal.  3. Left atrial size was moderately dilated.  4. The mitral valve is normal in structure. Moderate mitral valve regurgitation.  5. Tricuspid valve regurgitation is mild to moderate.  6. The aortic valve is normal in structure. Aortic valve regurgitation is mild. FINDINGS  Left Ventricle: The left ventricle demonstrates regional wall motion abnormalities. Moderate hypokinesis of the left ventricular, mid-apical anterolateral wall. Right Ventricle: The right ventricular size is normal. No increase in right ventricular wall thickness. Right ventricular systolic function is normal. Left Atrium: Left atrial size was moderately dilated. Right Atrium: Right atrial size was normal in size. Pericardium: There is no evidence of pericardial effusion. Mitral Valve: The mitral valve is normal in structure. Moderate mitral valve regurgitation. Tricuspid Valve: The tricuspid valve is normal in structure. Tricuspid valve regurgitation is mild to moderate. Aortic Valve: The aortic valve is normal in structure. Aortic valve regurgitation is mild. Pulmonic Valve: The pulmonic valve was normal in structure. Pulmonic valve regurgitation is trivial. Aorta: The aortic root and ascending aorta are structurally normal, with no evidence of dilitation. IAS/Shunts: No atrial level shunt detected by color flow Doppler. LEFT VENTRICLE PLAX 2D LVIDd:         4.40 cm LVIDs:         3.20  cm LV PW:         1.10 cm LV IVS:        1.00 cm LVOT diam:     2.00 cm LVOT Area:     3.14 cm  LEFT ATRIUM         Index LA diam:    3.40 cm 2.23 cm/m   AORTA Ao Root diam: 2.90 cm  SHUNTS Systemic Diam: 2.00 cm Serafina Royals MD Electronically signed by Serafina Royals MD Signature Date/Time: 01/04/2022/4:56:35 PM    Final    DG Chest 1 View  Result Date: 01/04/2022 CLINICAL DATA:  81 year old female intubated.  Post CPR. EXAM: CHEST  1 VIEW COMPARISON:   Portable chest 01/03/2022 and earlier. FINDINGS: Portable AP semi upright view at 0611 hours. Endotracheal tube tip partially obscured by sternotomy hardware, but appears to be in good position between the level the clavicles and carina. Enteric tube courses into the abdomen, tip is not included. Stable pacer or resuscitation pads over the left chest. Stable lung volumes. Mediastinal contours remain normal. Allowing for portable technique the lungs are clear. No pneumothorax or pleural effusion identified. Negative visible bowel gas. Stable visualized osseous structures. IMPRESSION: 1. Satisfactory ET tube position. Enteric tube courses to the abdomen, tip not included. 2. No acute cardiopulmonary abnormality. Electronically Signed   By: Genevie Ann M.D.   On: 01/04/2022 07:26   DG Chest Portable 1 View  Result Date: 01/03/2022 CLINICAL DATA:  Intubation, post CPR EXAM: PORTABLE CHEST 1 VIEW COMPARISON:  01/03/2022 FINDINGS: Endotracheal tube is in the right mainstem bronchus. Recommend retracting approximately 5 cm. NG tube enters the stomach. Prior CABG. Heart is normal size. Increasing airspace disease throughout the left lung which may reflect atelectasis due to right mainstem intubation. Similar right upper lobe opacity/atelectasis. No effusions. IMPRESSION: Right mainstem intubation. Recommend retracting endotracheal tube approximately 5 cm. Areas of airspace disease in the left lung and right upper lobe may be related to atelectasis from right mainstem intubation. These results were called by telephone at the time of interpretation on 01/03/2022 at 10:21 pm to provider [facility] , who verbally acknowledged these results. Electronically Signed   By: Rolm Baptise M.D.   On: 01/03/2022 22:24   CT Cervical Spine Wo Contrast  Result Date: 01/03/2022 CLINICAL DATA:  Neck trauma (Age >= 65y) EXAM: CT CERVICAL SPINE WITHOUT CONTRAST TECHNIQUE: Multidetector CT imaging of the cervical spine was performed without  intravenous contrast. Multiplanar CT image reconstructions were also generated. RADIATION DOSE REDUCTION: This exam was performed according to the departmental dose-optimization program which includes automated exposure control, adjustment of the mA and/or kV according to patient size and/or use of iterative reconstruction technique. COMPARISON:  None Available. FINDINGS: Alignment: Normal Skull base and vertebrae: No acute fracture. No primary bone lesion or focal pathologic process. Soft tissues and spinal canal: No prevertebral fluid or swelling. No visible canal hematoma. 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 Mild bilateral degenerative facet disease. Upper chest: No acute findings Other: None IMPRESSION: Degenerative disc and facet disease.  No acute bony abnormality. Electronically Signed   By: Rolm Baptise M.D.   On: 01/03/2022 20:10   CT HEAD WO CONTRAST (5MM)  Result Date: 01/03/2022 CLINICAL DATA:  Head trauma, minor (Age >= 65y) EXAM: CT HEAD WITHOUT CONTRAST TECHNIQUE: Contiguous axial images were obtained from the base of the skull through the vertex without intravenous contrast. RADIATION DOSE REDUCTION: This exam was  performed according to the departmental dose-optimization program which includes automated exposure control, adjustment of the mA and/or kV according to patient size and/or use of iterative reconstruction technique. COMPARISON:  10/03/2021 FINDINGS: Brain: No acute intracranial abnormality. Specifically, no hemorrhage, hydrocephalus, mass lesion, acute infarction, or significant intracranial injury. Vascular: No hyperdense vessel or unexpected calcification. Skull: No acute calvarial abnormality. Sinuses/Orbits: No acute findings Other: None IMPRESSION: Normal study for patient's age. Electronically Signed   By: Rolm Baptise M.D.   On: 01/03/2022 20:08   CT CHEST ABDOMEN PELVIS WO CONTRAST  Result Date:  01/03/2022 CLINICAL DATA:  Pneumonia, complication suspected, xray done. Shortness of breath. EXAM: CT CHEST, ABDOMEN AND PELVIS WITHOUT CONTRAST TECHNIQUE: Multidetector CT imaging of the chest, abdomen and pelvis was performed following the standard protocol without IV contrast. RADIATION DOSE REDUCTION: This exam was performed according to the departmental dose-optimization program which includes automated exposure control, adjustment of the mA and/or kV according to patient size and/or use of iterative reconstruction technique. COMPARISON:  None Available. FINDINGS: CT CHEST FINDINGS Cardiovascular: Heavily calcified aorta and coronary arteries. Prior CABG. Heart is normal size. Aorta is normal caliber. Mediastinum/Nodes: No mediastinal, hilar, or axillary adenopathy. Trachea and esophagus are unremarkable. Thyroid unremarkable. Lungs/Pleura: Lungs are clear. No focal airspace opacities or suspicious nodules. No effusions. Musculoskeletal: Chest wall soft tissues are unremarkable. No acute bony abnormality. CT ABDOMEN PELVIS FINDINGS Hepatobiliary: Small gallstone within the gallbladder. No focal hepatic abnormality. Pancreas: No focal abnormality or ductal dilatation. Spleen: No focal abnormality.  Normal size. Adrenals/Urinary Tract: Left kidney is atrophic. No renal or adrenal mass. No stones or hydronephrosis. Urinary bladder unremarkable. Stomach/Bowel: Left colonic diverticulosis. No active diverticulitis. Stomach and small bowel decompressed, unremarkable. Vascular/Lymphatic: Heavily calcified aorta. No evidence of aneurysm or adenopathy. Reproductive: Calcified fibroids in the uterus.  No adnexal masses. Other: No free fluid or free air. Musculoskeletal: No acute bony abnormality. Scoliosis and degenerative changes in the lumbar spine. IMPRESSION: No acute cardiopulmonary disease. Diffuse aortoiliac atherosclerosis.  No aneurysm. Cholelithiasis. Left colonic diverticulosis.  No active diverticulitis. No  acute findings in the abdomen or pelvis. Electronically Signed   By: Rolm Baptise M.D.   On: 01/03/2022 20:05   DG Chest 2 View  Result Date: 01/03/2022 CLINICAL DATA:  Shortness of breath EXAM: CHEST - 2 VIEW COMPARISON:  12/06/2021 FINDINGS: Prior CABG. Heart and mediastinal contours are within normal limits. No focal opacities or effusions. No acute bony abnormality. IMPRESSION: No active cardiopulmonary disease. Electronically Signed   By: Rolm Baptise M.D.   On: 01/03/2022 19:25    ECHO 01/04/2022  1. The left ventricle demonstrates regional wall motion abnormalities  (see scoring diagram/findings for description).   2. Right ventricular systolic function is normal. The right ventricular  size is normal.   3. Left atrial size was moderately dilated.   4. The mitral valve is normal in structure. Moderate mitral valve  regurgitation.   5. Tricuspid valve regurgitation is mild to moderate.   6. The aortic valve is normal in structure. Aortic valve regurgitation is  mild.   FINDINGS   Left Ventricle: The left ventricle demonstrates regional wall motion  abnormalities. Moderate hypokinesis of the left ventricular, mid-apical  anterolateral wall.   Right Ventricle: The right ventricular size is normal. No increase in  right ventricular wall thickness. Right ventricular systolic function is  normal.   Left Atrium: Left atrial size was moderately dilated.   Right Atrium: Right atrial size was normal in size.  Pericardium: There is no evidence of pericardial effusion.   Mitral Valve: The mitral valve is normal in structure. Moderate mitral  valve regurgitation.   Tricuspid Valve: The tricuspid valve is normal in structure. Tricuspid  valve regurgitation is mild to moderate.   Aortic Valve: The aortic valve is normal in structure. Aortic valve  regurgitation is mild.   Pulmonic Valve: The pulmonic valve was normal in structure. Pulmonic valve  regurgitation is trivial.    Aorta: The aortic root and ascending aorta are structurally normal, with  no evidence of dilitation.   IAS/Shunts: No atrial level shunt detected by color flow Doppler.   12/07/2021  1. Left ventricular ejection fraction, by estimation, is 55 to 60%. The  left ventricle has normal function. The left ventricle has no regional  wall motion abnormalities. Left ventricular diastolic parameters are  consistent with Grade I diastolic  dysfunction (impaired relaxation).   2. Right ventricular systolic function is normal. The right ventricular  size is normal.   3. The mitral valve is normal in structure. Moderate mitral valve  regurgitation.   4. The aortic valve is normal in structure. Aortic valve regurgitation is  mild.   FINDINGS   Left Ventricle: Left ventricular ejection fraction, by estimation, is 55  to 60%. The left ventricle has normal function. The left ventricle has no  regional wall motion abnormalities. The left ventricular internal cavity  size was normal in size. There is   no left ventricular hypertrophy. Left ventricular diastolic parameters  are consistent with Grade I diastolic dysfunction (impaired relaxation).   Right Ventricle: The right ventricular size is normal. No increase in  right ventricular wall thickness. Right ventricular systolic function is  normal.   Left Atrium: Left atrial size was normal in size.   Right Atrium: Right atrial size was normal in size.   Pericardium: There is no evidence of pericardial effusion.   Mitral Valve: The mitral valve is normal in structure. Moderate mitral  valve regurgitation. MV peak gradient, 4.8 mmHg. The mean mitral valve  gradient is 1.0 mmHg.   Tricuspid Valve: The tricuspid valve is grossly normal. Tricuspid valve  regurgitation is mild.   Aortic Valve: The aortic valve is normal in structure. Aortic valve  regurgitation is mild. Aortic regurgitation PHT measures 567 msec. Aortic  valve mean gradient  measures 10.0 mmHg. Aortic valve peak gradient  measures 16.9 mmHg. Aortic valve area, by VTI   measures 1.12 cm.   Pulmonic Valve: The pulmonic valve was normal in structure. Pulmonic valve  regurgitation is not visualized.   Aorta: The ascending aorta was not well visualized.   IAS/Shunts: No atrial level shunt detected by color flow Doppler.   TELEMETRY reviewed by me: sinus rhythm rate 70s, multiple short runs of NSVT this morning.  EKG reviewed by me: sinus bradycardia 57, lateral ST depressions V5-6, improving on repeat. LBBB  ASSESSMENT AND PLAN:  Lindsey Pollard is an 56yoF with a PMH of CAD s/p CABG x2 in 2018 (patent LIMA to LAD,  occluded SVG to OM1 by [facility] 05/2021), h/o PCI x1 distal RCA (50% ISR by [facility] 05/2021), ischemic CM with LVEF with moderate hypo of LV mid apical anterolateral wall, 55-60%, g1dd, mod MR 12/2021, CKD 3, hyperlipidemia, type 2 diabetes, CAD, GERD who was recently hospitalized at [facility] from 7/12 - 7/18 initially for shortness of breath and developed chest pain, VF arrest with successful resuscitation and her NSTEMI was treated medically.  She presents to [facility] ED 2  days after discharge with acute encephalopathy, nausea, vomiting, and was intubated for airway protection in the ED. cardiology was consulted due to her significant cardiac history and concern for NSTEMI.  #Acute encephalopathy #recent NSTEMI 01/04/2022 - h/o significant CAD s/p CABG x2 with known occluded SVG to OM1, LIMA to LAD patent by [facility] 05/2021 #Ischemic cardiomyopathy #Acute hypoxic respiratory failure Patient has a complicated and significant history of CAD with multiple admissions over the past year for chest pain and has been medically managed due to patient preference.  She was most recently hospitalized last week with chest pain, VF arrest with ROSC requiring intubation.  Initial EKG showed significant lateral ST depressions and echo showed a new LV mid apical anterolateral hypokinesis.  Her  NSTEMI was ultimately treated medically.  She presents again 2 days after discharge with confusion and acting strangely with multiple episodes of emesis.  Her troponin is downtrending from last admission at 2206-1700 (compared to a peak of 6300 previously). Her EKG shows lateral ST depressions and LBBB, improving on repeats. UA is positive for leukocytes she is being treated for possible aspiration pneumonia.  -Agree with current therapy per primary team -Give 325 mg aspirin, continue DAPT with aspirin 81 mg and 75 mg clopidogrel daily. -Continue heparin drip for 48 hours -Continue atorvastatin 80 mg daily -Wean vasopressor support as tolerated, currently on both Levophed and vasopressin  -Restart her home antianginals/antihypertensives as her blood pressure allows: Metoprolol succinate 50 mg XL, isosorbide 30 mg daily. -Hold torsemide 20 mg, spironolactone 12.5 mg, lisinopril 20 mg due to AKI -Limited echo -Hopefully she can be extubated and further conversations regarding goals of care can occur.  Per Dr. Clayborn Bigness, plan to hold off on heart cath at this time and continue medical therapy as above.  This patient's plan of care was discussed and created with Dr. Clayborn Bigness and he is in agreement.  Signed: Tristan Schroeder , PA-C 01/12/2022, 8:05 AM [employer] [facility]

## 2022-01-13 DIAGNOSIS — N179 Acute kidney failure, unspecified: Secondary | ICD-10-CM

## 2022-01-13 DIAGNOSIS — I700 Atherosclerosis of aorta: Secondary | ICD-10-CM

## 2022-01-13 DIAGNOSIS — J9601 Acute respiratory failure with hypoxia: Secondary | ICD-10-CM

## 2022-01-13 LAB — CBC
HCT: 24.2 % — ABNORMAL LOW (ref 36.0–46.0)
Hemoglobin: 7.7 g/dL — ABNORMAL LOW (ref 12.0–15.0)
MCH: 31 pg (ref 26.0–34.0)
MCHC: 31.8 g/dL (ref 30.0–36.0)
MCV: 97.6 fL (ref 80.0–100.0)
Platelets: 312 10*3/uL (ref 150–400)
RBC: 2.48 MIL/uL — ABNORMAL LOW (ref 3.87–5.11)
RDW: 14.3 % (ref 11.5–15.5)
WBC: 9.4 10*3/uL (ref 4.0–10.5)
nRBC: 0 % (ref 0.0–0.2)

## 2022-01-13 LAB — PHOSPHORUS: Phosphorus: 3.6 mg/dL (ref 2.5–4.6)

## 2022-01-13 LAB — GLUCOSE, CAPILLARY
Glucose-Capillary: 118 mg/dL — ABNORMAL HIGH (ref 70–99)
Glucose-Capillary: 126 mg/dL — ABNORMAL HIGH (ref 70–99)
Glucose-Capillary: 135 mg/dL — ABNORMAL HIGH (ref 70–99)
Glucose-Capillary: 136 mg/dL — ABNORMAL HIGH (ref 70–99)
Glucose-Capillary: 156 mg/dL — ABNORMAL HIGH (ref 70–99)
Glucose-Capillary: 163 mg/dL — ABNORMAL HIGH (ref 70–99)
Glucose-Capillary: 176 mg/dL — ABNORMAL HIGH (ref 70–99)
Glucose-Capillary: 182 mg/dL — ABNORMAL HIGH (ref 70–99)
Glucose-Capillary: 182 mg/dL — ABNORMAL HIGH (ref 70–99)

## 2022-01-13 LAB — URINE CULTURE: Culture: >=100000 — AB

## 2022-01-13 LAB — BASIC METABOLIC PANEL
Anion gap: 6 (ref 5–15)
BUN: 41 mg/dL — ABNORMAL HIGH (ref 8–23)
CO2: 25 mmol/L (ref 22–32)
Calcium: 7.9 mg/dL — ABNORMAL LOW (ref 8.9–10.3)
Chloride: 103 mmol/L (ref 98–111)
Creatinine, Ser: 1.42 mg/dL — ABNORMAL HIGH (ref 0.44–1.00)
GFR, Estimated: 37 mL/min — ABNORMAL LOW (ref >60)
Glucose, Bld: 198 mg/dL — ABNORMAL HIGH (ref 70–99)
Potassium: 3.6 mmol/L (ref 3.5–5.1)
Sodium: 134 mmol/L — ABNORMAL LOW (ref 135–145)

## 2022-01-13 LAB — MAGNESIUM: Magnesium: 2.3 mg/dL (ref 1.7–2.4)

## 2022-01-13 LAB — PROCALCITONIN: Procalcitonin: 0.12 ng/mL

## 2022-01-13 LAB — HEPARIN LEVEL (UNFRACTIONATED): Heparin Unfractionated: 0.25 IU/mL — ABNORMAL LOW (ref 0.30–0.70)

## 2022-01-13 MED ORDER — POTASSIUM CHLORIDE 10 MEQ/100ML IV SOLN
10.0000 meq | INTRAVENOUS | Status: AC
  Administered 2022-01-13 (×3): 10 meq via INTRAVENOUS
  Filled 2022-01-13 (×3): qty 100

## 2022-01-13 NOTE — Consult Note (Signed)
PHARMACY CONSULT NOTE  Pharmacy Consult for Electrolyte Monitoring and Replacement   Recent Labs: Potassium (mmol/L)  Date Value  01/13/2022 3.6   Magnesium (mg/dL)  Date Value  10/62/6948 2.3   Calcium (mg/dL)  Date Value  54/62/7035 7.9 (L)   Albumin (g/dL)  Date Value  00/93/8182 3.3 (L)   Phosphorus (mg/dL)  Date Value  99/37/1696 3.6   Sodium (mmol/L)  Date Value  01/13/2022 134 (L)   Assessment: Patient is an 81 y/o F with medical history including CAD s/p CABG, HTN, HLD, DM, PAD, CHF (EF 45-50%), CKD-3a, RLS, recent admission 01/03/22 thru 01/09/22 for NSTEMI, Vfib cardiac arrest, AKI now presenting to the ED 7/20 with nausea, vomiting, SOB, lethargy. Patient ultimately required intubation.  Labs notable for elevated troponin, BNP, Scr, hypokalemia  Goal of Therapy:  K >= 4, Mg >= 2 All other electrolytes within normal limits  Plan:  Scr 1.75>1.42; UOP 0.1>0.63ml/k/h. Net I/O +2.2L. K 3.1>3.6, after KCL IV 10 mEq x 3 on 7/21; (replace cautiously given AKI. Note goals d/t recent Vfib arrest.) Will repeat KCL IV x3 doses given K+ >4 goal and improved UOP.  --No further electrolyte replacement indicated at this time --Re-check potassium at 1500, follow-up electrolytes again with AM labs tomorrow  Martyn Malay 01/13/2022 9:08 AM

## 2022-01-13 NOTE — Progress Notes (Signed)
NAME:  Lindsey Pollard, MRN:  [id number], DOB:  Sep 24, 1940, LOS: 2 ADMISSION DATE:  01/11/2022  Brief Pt Description / Synopsis:  81 year old female admitted with acute metabolic encephalopathy and acute respiratory failure in the setting of acute on chronic HFpEF, elevated troponins (NSTEMI versus demand ischemia), UTI and presumed aspiration.  intubation for airway protection and multiorgan failure   History of Present Illness:  81 year old female with a past medical history significant for recent V-fib arrest, HFpEF, ischemic cardiomyopathy, CAD status post CABG x2 in 2018 and PCI, chronic kidney disease stage III, diabetes mellitus who presents to [facility] ED on 01/11/2022 due to altered mental status, respiratory distress, and nausea/vomiting.  Patient is currently intubated and sedated, therefore history is obtained from patient's daughter at bedside along with chart review.   Per the patient's daughter, the patient was recently admitted from 7/12 through 7/18 for brief cardiac arrest in the setting of NSTEMI and acute CHF, along with acute kidney injury.  Her mother was discharged home with PT/OT and cardiac rehab.  However since discharge her mother has been severely weak, very sleepy (more than normal), with poor p.o. intake.  Earlier this morning around the patient was very altered and confused/ "not acting right",vomiting (nonbloody), and appeared short of breath.   In the ED she was ill appearing but not hypoxic.  She was complaining she could not breathe, and subsequently was intubated for airway protection.   ED Course: Initial Vital Signs: Temperature 97.5 orally, respiratory rate 12, pulse 55, blood pressure 132/44, SPO2 100% on 2 L nasal cannula Significant Labs: Sodium 134, potassium 3.0, glucose 169, BUN 48, creatinine 1.57, high-sensitivity troponin 2206, BNP 778, hemoglobin 8.9, hematocrit 27.8, lactic acid 1.4 Post intubation ABG: pH 7.34/PCO2 46/PO2 126/bicarb 24.8 Urinalysis is  concerning for UTI (large leukocytes, greater than 50 WBC) Imaging Chest X-ray>>FINDINGS: Prior median sternotomy/CABG. Heart size within normal limits. Aortic atherosclerosis. No appreciable airspace consolidation or pulmonary edema. No evidence of pleural effusion or pneumothorax. No acute bony abnormality identified. CT head without contrast>>IMPRESSION: No acute intracranial findings are seen.  Atrophy. CT chest/abdomen/pelvis>>IMPRESSION: Tip of endotracheal tube is at the carina and should be pulled back 2-3 cm. There is interval appearance of small bilateral pleural effusions, more so on the left side. There is thickening of interlobular septi suggesting possible interstitial pulmonary edema. There are patchy densities in the left parahilar region and both lower lung fields which may be due to pulmonary edema or pneumonia. Extensive coronary artery calcifications are seen. There is no evidence of intestinal obstruction or pneumoperitoneum. There is no hydronephrosis. Gallbladder stones. Diverticulosis of colon without signs of diverticulitis. EKG: ST depressions in lateral leads V5 and V6 new left bundle branch block.,  ST depressions somewhat improving on repeat 2 hours later Medications Administered: 3500 units heparin bolus, heparin drip, normal saline bolus   Cardiology has been consulted.  PCCM asked to admit for further work-up and treatment.   Pertinent  Medical History        Past Medical History:  Diagnosis Date   Chest pain 12/06/2021   Diabetes mellitus without complication (HCC)     Hyperlipidemia     Hypertension     Myocardial infarction (HCC)        Micro Data:  7/20: Urine>> 7/20: Blood culture x2>> 7/20: Tracheal aspirate>>   Antimicrobials:  Ceftriaxone 7/20 x1 dose Zosyn 7/20>>   Significant Hospital Events: Including procedures, antibiotic start and stop dates in addition to other pertinent events  7/20: Presented to ED, required intubation in  ED.  Cardiology consulted for NSTEMI.  PCCM asked to admit 7/21 multiorgan failure, pressors, poor prognosis         Interim History / Subjective:  Severe shock Multiorgan failure DNR status Remains critically ill      Objective   Blood pressure (!) 123/37, pulse (!) 51, temperature 98.6 F (37 C), resp. rate 17, height 4\' 11"  (1.499 m), weight 55.1 kg, SpO2 99 %.    Vent Mode: PRVC FiO2 (%):  [24 %] 24 % Set Rate:  [16 bmp] 16 bmp Vt Set:  [350 mL] 350 mL PEEP:  [5 cmH20] 5 cmH20 Plateau Pressure:  [14 cmH20-17 cmH20] 17 cmH20   Intake/Output Summary (Last 24 hours) at 01/13/2022 0738 Last data filed at 01/13/2022 0400 Gross per 24 hour  Intake 1543.91 ml  Output 605 ml  Net 938.91 ml    Filed Weights   01/11/22 0658 01/12/22 0456 01/13/22 0300  Weight: 63.7 kg 63.7 kg 55.1 kg     REVIEW OF SYSTEMS  PATIENT IS UNABLE TO PROVIDE COMPLETE REVIEW OF SYSTEMS DUE TO SEVERE CRITICAL ILLNESS    PHYSICAL EXAMINATION:  GENERAL:critically ill appearing, +resp distress EYES: Pupils equal, round, reactive to light.  No scleral icterus.  MOUTH: Moist mucosal membrane. INTUBATED NECK: Supple.  PULMONARY: +rhonchi, +wheezing CARDIOVASCULAR: S1 and S2.  No murmurs  GASTROINTESTINAL: Soft, nontender, -distended. Positive bowel sounds.  MUSCULOSKELETAL: No swelling, clubbing, or edema.  NEUROLOGIC: obtunded SKIN:intact,warm,dry    Labs/imaging that I havepersonally reviewed  (right click and "Reselect all SmartList Selections" daily)     ASSESSMENT AND PLAN SYNOPSIS  81 yo white female admitted for severe aspiration pneumonia with septic/cardiogenic shock due to NSTEMI leading to severe hypoxic and hypercapnic respiratory  and renal failure  Severe ACUTE Hypoxic and Hypercapnic Respiratory Failure -continue Mechanical Ventilator support -continue Bronchodilator Therapy -Wean Fio2 and PEEP as tolerated -VAP/VENT bundle implementation -will NOT perform  SAT/SBT  Vent Mode: PRVC FiO2 (%):  [24 %] 24 % Set Rate:  [16 bmp] 16 bmp Vt Set:  [350 mL] 350 mL PEEP:  [5 cmH20] 5 cmH20 Plateau Pressure:  [14 cmH20-17 cmH20] 17 cmH20    CARDIAC FAILURE-NSTEMI -oxygen as needed -Lasix as tolerated -follow up cardiac enzymes as indicated Follow up cardiology recs   CARDIAC ICU monitoring   ACUTE KIDNEY INJURY/Renal Failure -continue Foley Catheter-assess need -Avoid nephrotoxic agents -Follow urine output, BMP -Ensure adequate renal perfusion, optimize oxygenation -Renal dose medications   Intake/Output Summary (Last 24 hours) at 01/13/2022 0738 Last data filed at 01/13/2022 0400 Gross per 24 hour  Intake 1543.91 ml  Output 605 ml  Net 938.91 ml      NEUROLOGY Acute  metabolic encephalopathy, need for sedation Goal RASS -2 to -3   SHOCK SOURCE-SEPTIC/CARDIOGENIC -use vasopressors to keep MAP>65 as needed -follow ABG and LA -follow up cultures -emperic ABX -consider stress dose steroids   INFECTIOUS DISEASE -continue antibiotics as prescribed -follow up cultures  ENDO - ICU hypoglycemic\Hyperglycemia protocol -check FSBS per protocol   GI GI PROPHYLAXIS as indicated  NUTRITIONAL STATUS DIET-->TF's as tolerated Constipation protocol as indicated   ELECTROLYTES -follow labs as needed -replace as needed -pharmacy consultation and following     Best practice (right click and "Reselect all SmartList Selections" daily)  Diet:  NPO Pain/Anxiety/Delirium protocol (if indicated): Yes (RASS goal -2) VAP protocol (if indicated): Yes DVT prophylaxis: Systemic AC GI prophylaxis: PPI Central venous access:  Yes, and it  is still needed Foley:  Yes, and it is still needed Mobility:  bed rest  Code Status:  FULL CODE Disposition: ICU  Labs   CBC: Recent Labs  Lab 01/07/22 0438 01/11/22 0719 01/12/22 0415 01/13/22 0339  WBC 10.2 5.7 12.0* 9.4  NEUTROABS  --  3.7  --   --   HGB 8.5* 8.9* 8.6* 7.7*   HCT 26.1* 27.8* 26.7* 24.2*  MCV 97.0 96.2 96.0 97.6  PLT 156 364 411* 312     Basic Metabolic Panel: Recent Labs  Lab 01/07/22 0438 01/08/22 0426 01/09/22 0504 01/11/22 0713 01/11/22 0719 01/11/22 1645 01/12/22 0415 01/13/22 0339  NA 132* 133* 132*  --  134*  --  135 134*  K 4.1 4.1 3.4*  --  3.0* 3.6 3.1* 3.6  CL 100 100 99  --  99  --  100 103  CO2 25 24 25   --  26  --  28 25  GLUCOSE 85 99 99  --  169*  --  158* 198*  BUN 39* 47* 44*  --  48*  --  46* 41*  CREATININE 1.33* 1.39* 1.18*  --  1.57*  --  1.75* 1.42*  CALCIUM 8.2* 8.4* 8.1*  --  8.8*  --  8.4* 7.9*  MG 2.1  --   --  2.4  --   --  2.1 2.3  PHOS 3.8  --   --   --   --   --  4.2 3.6    GFR: Estimated Creatinine Clearance: 23.9 mL/min (A) (by C-G formula based on SCr of 1.42 mg/dL (H)). Recent Labs  Lab 01/07/22 0438 01/11/22 0719 01/11/22 0840 01/11/22 1049 01/12/22 0415 01/13/22 0339  PROCALCITON  --   --  0.46  --  0.32 0.12  WBC 10.2 5.7  --   --  12.0* 9.4  LATICACIDVEN  --   --  1.4 2.3*  --   --      Liver Function Tests: Recent Labs  Lab 01/11/22 0719  AST 28  ALT 33  ALKPHOS 66  BILITOT 0.7  PROT 6.5  ALBUMIN 3.3*    No results for input(s): "LIPASE", "AMYLASE" in the last 168 hours. No results for input(s): "AMMONIA" in the last 168 hours.  ABG    Component Value Date/Time   PHART 7.34 (L) 01/11/2022 0825   PCO2ART 46 01/11/2022 0825   PO2ART 126 (H) 01/11/2022 0825   HCO3 24.8 01/11/2022 0825   ACIDBASEDEF 1.3 01/11/2022 0825   O2SAT 99.9 01/11/2022 0825     Coagulation Profile: Recent Labs  Lab 01/11/22 0719 01/11/22 0840  INR 1.0 1.1     Cardiac Enzymes: No results for input(s): "CKTOTAL", "CKMB", "CKMBINDEX", "TROPONINI" in the last 168 hours.  HbA1C: Hgb A1c MFr Bld  Date/Time Value Ref Range Status  12/06/2021 11:42 AM 6.8 (H) 4.8 - 5.6 % Final    Comment:    (NOTE) Pre diabetes:          5.7%-6.4%  Diabetes:              >6.4%  Glycemic control  for   <7.0% adults with diabetes   06/01/2021 09:30 AM 6.7 (H) 4.8 - 5.6 % Final    Comment:    (NOTE)         Prediabetes: 5.7 - 6.4         Diabetes: >6.4         Glycemic control for adults with diabetes: <  7.0     CBG: Recent Labs  Lab 01/12/22 1532 01/12/22 1925 01/12/22 2308 01/13/22 0312 01/13/22 0411  GLUCAP 147* 138* 129* 182* 182*     Allergies Allergies  Allergen Reactions   Tetanus Toxoids Swelling    Tetanus and diphtheria toxids   Penicillin G Rash       DVT/GI PRX  assessed I Assessed the need for Labs I Assessed the need for Foley I Assessed the need for Central Venous Line Family Discussion when available I Assessed the need for Mobilization I made an Assessment of medications to be adjusted accordingly Safety Risk assessment completed  CASE DISCUSSED IN MULTIDISCIPLINARY ROUNDS WITH ICU TEAM    PATIENT WITH VERY POOR PROGNOSIS I ANTICIPATE PROLONGED ICU LOS  Patient with Multiorgan failure and at high risk for cardiac arrest and death.

## 2022-01-13 NOTE — Consult Note (Signed)
ANTICOAGULATION CONSULT NOTE  Pharmacy Consult for heparin infusion Indication: chest pain/ACS  Allergies  Allergen Reactions   Tetanus Toxoids Swelling    Tetanus and diphtheria toxids   Penicillin G Rash    Patient Measurements: Height: 4\' 11"  (149.9 cm) Weight: 55.1 kg (121 lb 7.6 oz) IBW/kg (Calculated) : 43.2 Heparin Dosing Weight: 56.9 kg  Vital Signs: Temp: 98.6 F (37 C) (07/22 0400) Temp Source: Bladder (07/22 0400) BP: 131/40 (07/22 0400) Pulse Rate: 49 (07/22 0400)  Labs: Recent Labs    01/11/22 0719 01/11/22 0840 01/12/22 0415 01/12/22 1153 01/13/22 0339  HGB 8.9*  --  8.6*  --  7.7*  HCT 27.8*  --  26.7*  --  24.2*  PLT 364  --  411*  --  312  APTT  --  32  --   --   --   LABPROT 13.4 13.9  --   --   --   INR 1.0 1.1  --   --   --   HEPARINUNFRC  --   --  0.34 0.36 0.25*  CREATININE 1.57*  --  1.75*  --  1.42*  TROPONINIHS 2,206* 1,739*  --   --   --      Estimated Creatinine Clearance: 23.9 mL/min (A) (by C-G formula based on SCr of 1.42 mg/dL (H)).   Medical History: Past Medical History:  Diagnosis Date   Chest pain 12/06/2021   Diabetes mellitus without complication (HCC)    Hyperlipidemia    Hypertension    Myocardial infarction (HCC)     Medications:  No history of chronic anticoagulant use PTA  Assessment: Pharmacy has been consulted for heparin infusion in 81yo patient presenting to the ED with AMS, nausea, and shortness of breath. He was recently discharged from hospital after V-fib arrest(brief ICU stay). Also has history of CAD s/p CABG.   7/22 0339 HL= 0.25    subthera, increase from 700 to 800 units/hr  Goal of Therapy:  Heparin level 0.3-0.7 units/ml Monitor platelets by anticoagulation protocol: Yes   Plan:  7/22 0339 HL= 0.25    Heparin level subtherapeutic.  Increase heparin infusion to 800 units/hr until completion of 48 hours therapy (per cardiology recommendations) Check next anti-Xa level in 8 hrs if still on  drip Continue to monitor H&H and platelets  Lindsey Pollard A 01/13/2022,4:55 AM

## 2022-01-14 LAB — GLUCOSE, CAPILLARY
Glucose-Capillary: 151 mg/dL — ABNORMAL HIGH (ref 70–99)
Glucose-Capillary: 163 mg/dL — ABNORMAL HIGH (ref 70–99)
Glucose-Capillary: 167 mg/dL — ABNORMAL HIGH (ref 70–99)
Glucose-Capillary: 172 mg/dL — ABNORMAL HIGH (ref 70–99)
Glucose-Capillary: 178 mg/dL — ABNORMAL HIGH (ref 70–99)
Glucose-Capillary: 183 mg/dL — ABNORMAL HIGH (ref 70–99)

## 2022-01-14 LAB — BASIC METABOLIC PANEL
Anion gap: 7 (ref 5–15)
BUN: 35 mg/dL — ABNORMAL HIGH (ref 8–23)
CO2: 27 mmol/L (ref 22–32)
Calcium: 8.3 mg/dL — ABNORMAL LOW (ref 8.9–10.3)
Chloride: 102 mmol/L (ref 98–111)
Creatinine, Ser: 1.16 mg/dL — ABNORMAL HIGH (ref 0.44–1.00)
GFR, Estimated: 48 mL/min — ABNORMAL LOW (ref >60)
Glucose, Bld: 165 mg/dL — ABNORMAL HIGH (ref 70–99)
Potassium: 4.3 mmol/L (ref 3.5–5.1)
Sodium: 136 mmol/L (ref 135–145)

## 2022-01-14 LAB — CBC
HCT: 23.7 % — ABNORMAL LOW (ref 36.0–46.0)
Hemoglobin: 7.4 g/dL — ABNORMAL LOW (ref 12.0–15.0)
MCH: 30.5 pg (ref 26.0–34.0)
MCHC: 31.2 g/dL (ref 30.0–36.0)
MCV: 97.5 fL (ref 80.0–100.0)
Platelets: 281 10*3/uL (ref 150–400)
RBC: 2.43 MIL/uL — ABNORMAL LOW (ref 3.87–5.11)
RDW: 14.6 % (ref 11.5–15.5)
WBC: 8.3 10*3/uL (ref 4.0–10.5)
nRBC: 0 % (ref 0.0–0.2)

## 2022-01-14 LAB — CULTURE, RESPIRATORY W GRAM STAIN: Culture: NORMAL

## 2022-01-14 LAB — MAGNESIUM: Magnesium: 2.4 mg/dL (ref 1.7–2.4)

## 2022-01-14 LAB — TROPONIN I (HIGH SENSITIVITY)
Troponin I (High Sensitivity): 571 ng/L (ref <18)
Troponin I (High Sensitivity): 533 ng/L (ref <18)

## 2022-01-14 LAB — PHOSPHORUS: Phosphorus: 3.6 mg/dL (ref 2.5–4.6)

## 2022-01-14 MED ORDER — FUROSEMIDE 10 MG/ML IJ SOLN
40.0000 mg | Freq: Once | INTRAMUSCULAR | Status: AC
  Administered 2022-01-14: 40 mg via INTRAVENOUS
  Filled 2022-01-14: qty 4

## 2022-01-14 MED ORDER — DEXMEDETOMIDINE HCL IN NACL 400 MCG/100ML IV SOLN
0.4000 ug/kg/h | INTRAVENOUS | Status: DC
  Administered 2022-01-14 – 2022-01-15 (×2): 0.4 ug/kg/h via INTRAVENOUS
  Filled 2022-01-14 (×2): qty 100

## 2022-01-14 NOTE — Progress Notes (Signed)
NAME:  Lindsey Pollard, MRN:  [id number], DOB:  [birth year], LOS: 3 ADMISSION DATE:  01/11/2022  Brief Pt Description / Synopsis:  81 year old female admitted with acute metabolic encephalopathy and acute respiratory failure in the setting of acute on chronic HFpEF, elevated troponins (NSTEMI versus demand ischemia), UTI and presumed aspiration.  intubation for airway protection and multiorgan failure   History of Present Illness:  81 year old female with a past medical history significant for recent V-fib arrest, HFpEF, ischemic cardiomyopathy, CAD status post CABG x2 in 2018 and PCI, chronic kidney disease stage III, diabetes mellitus who presents to [facility] ED on 01/11/2022 due to altered mental status, respiratory distress, and nausea/vomiting.  Patient is currently intubated and sedated, therefore history is obtained from patient's daughter at bedside along with chart review.   Per the patient's daughter, the patient was recently admitted from 7/12 through 7/18 for brief cardiac arrest in the setting of NSTEMI and acute CHF, along with acute kidney injury.  Her mother was discharged home with PT/OT and cardiac rehab.  However since discharge her mother has been severely weak, very sleepy (more than normal), with poor p.o. intake.  Earlier this morning around the patient was very altered and confused/ "not acting right",vomiting (nonbloody), and appeared short of breath.   In the ED she was ill appearing but not hypoxic.  She was complaining she could not breathe, and subsequently was intubated for airway protection.   ED Course: Initial Vital Signs: Temperature 97.5 orally, respiratory rate 12, pulse 55, blood pressure 132/44, SPO2 100% on 2 L nasal cannula Significant Labs: Sodium 134, potassium 3.0, glucose 169, BUN 48, creatinine 1.57, high-sensitivity troponin 2206, BNP 778, hemoglobin 8.9, hematocrit 27.8, lactic acid 1.4 Post intubation ABG: pH 7.34/PCO2 46/PO2 126/bicarb 24.8 Urinalysis is  concerning for UTI (large leukocytes, greater than 50 WBC) Imaging Chest X-ray>>FINDINGS: Prior median sternotomy/CABG. Heart size within normal limits. Aortic atherosclerosis. No appreciable airspace consolidation or pulmonary edema. No evidence of pleural effusion or pneumothorax. No acute bony abnormality identified. CT head without contrast>>IMPRESSION: No acute intracranial findings are seen.  Atrophy. CT chest/abdomen/pelvis>>IMPRESSION: Tip of endotracheal tube is at the carina and should be pulled back 2-3 cm. There is interval appearance of small bilateral pleural effusions, more so on the left side. There is thickening of interlobular septi suggesting possible interstitial pulmonary edema. There are patchy densities in the left parahilar region and both lower lung fields which may be due to pulmonary edema or pneumonia. Extensive coronary artery calcifications are seen. There is no evidence of intestinal obstruction or pneumoperitoneum. There is no hydronephrosis. Gallbladder stones. Diverticulosis of colon without signs of diverticulitis. EKG: ST depressions in lateral leads V5 and V6 new left bundle branch block.,  ST depressions somewhat improving on repeat 2 hours later Medications Administered: 3500 units heparin bolus, heparin drip, normal saline bolus   Cardiology has been consulted.  PCCM asked to admit for further work-up and treatment.   Pertinent  Medical History        Past Medical History:  Diagnosis Date   Chest pain 12/06/2021   Diabetes mellitus without complication (HCC)     Hyperlipidemia     Hypertension     Myocardial infarction (HCC)        Micro Data:  7/20: Urine>> 7/20: Blood culture x2>> 7/20: Tracheal aspirate>>   Antimicrobials:  Ceftriaxone 7/20 x1 dose Zosyn 7/20>>   Significant Hospital Events: Including procedures, antibiotic start and stop dates in addition to other pertinent events  7/20: Presented to ED, required intubation in  ED.  Cardiology consulted for NSTEMI.  PCCM asked to admit 7/21 multiorgan failure, pressors, poor prognosis 01/14/22 family was updated yesterday patient is DNR failed weaning trial yesterday Versed drip probably we will try another weaning trial today.     Interim History / Subjective:  Severe shock Multiorgan failure DNR status Remains critically ill      Objective   Blood pressure (!) 134/50, pulse 79, temperature 98.8 F (37.1 C), resp. rate 17, height 4\' 11"  (1.499 m), weight 56.2 kg, SpO2 98 %.    Vent Mode: PSV;CPAP FiO2 (%):  [24 %] 24 % Set Rate:  [16 bmp] 16 bmp Vt Set:  [350 mL] 350 mL PEEP:  [5 cmH20] 5 cmH20 Pressure Support:  [5 cmH20-10 cmH20] 10 cmH20 Plateau Pressure:  [10 cmH20-19 cmH20] 19 cmH20   Intake/Output Summary (Last 24 hours) at 01/14/2022 0801 Last data filed at 01/14/2022 [id number] Gross per 24 hour  Intake 1838.22 ml  Output 850 ml  Net 988.22 ml    Filed Weights   01/12/22 0456 01/13/22 0300 01/14/22 0500  Weight: 63.7 kg 55.1 kg 56.2 kg     REVIEW OF SYSTEMS  PATIENT IS UNABLE TO PROVIDE COMPLETE REVIEW OF SYSTEMS DUE TO SEVERE CRITICAL ILLNESS    PHYSICAL EXAMINATION:  GENERAL:critically ill appearing, +resp distress EYES: Pupils equal, round, reactive to light.  No scleral icterus.  MOUTH: Moist mucosal membrane. INTUBATED NECK: Supple.  PULMONARY: +rhonchi, +wheezing CARDIOVASCULAR: S1 and S2.  No murmurs  GASTROINTESTINAL: Soft, nontender, -distended. Positive bowel sounds.  MUSCULOSKELETAL: No swelling, clubbing, or edema.  NEUROLOGIC: obtunded SKIN:intact,warm,dry    Labs/imaging that I havepersonally reviewed  (right click and "Reselect all SmartList Selections" daily)     ASSESSMENT AND PLAN SYNOPSIS  81 yo white female admitted for severe aspiration pneumonia with septic/cardiogenic shock due to NSTEMI leading to severe hypoxic and hypercapnic respiratory  and renal failure  Severe ACUTE Hypoxic and Hypercapnic  Respiratory Failure -continue Mechanical Ventilator support -continue Bronchodilator Therapy -Wean Fio2 and PEEP as tolerated -VAP/VENT bundle implementation -will NOT perform SAT/SBT  Vent Mode: PSV;CPAP FiO2 (%):  [24 %] 24 % Set Rate:  [16 bmp] 16 bmp Vt Set:  [350 mL] 350 mL PEEP:  [5 cmH20] 5 cmH20 Pressure Support:  [5 cmH20-10 cmH20] 10 cmH20 Plateau Pressure:  [10 cmH20-19 cmH20] 19 cmH20    CARDIAC FAILURE-NSTEMI -oxygen as needed -Lasix as tolerated -follow up cardiac enzymes as indicated Follow up cardiology recs   CARDIAC ICU monitoring   ACUTE KIDNEY INJURY/Renal Failure -continue Foley Catheter-assess need -Avoid nephrotoxic agents -Follow urine output, BMP -Ensure adequate renal perfusion, optimize oxygenation -Renal dose medications   Intake/Output Summary (Last 24 hours) at 01/14/2022 0801 Last data filed at 01/14/2022 [id number] Gross per 24 hour  Intake 1838.22 ml  Output 850 ml  Net 988.22 ml      NEUROLOGY Acute  metabolic encephalopathy, need for sedation Goal RASS -2 to -3   SHOCK SOURCE-SEPTIC/CARDIOGENIC -use vasopressors to keep MAP>65 as needed -follow ABG and LA -follow up cultures -emperic ABX -consider stress dose steroids   INFECTIOUS DISEASE -continue antibiotics as prescribed -follow up cultures  ENDO - ICU hypoglycemic\Hyperglycemia protocol -check FSBS per protocol   GI GI PROPHYLAXIS as indicated  NUTRITIONAL STATUS DIET-->TF's as tolerated Constipation protocol as indicated   ELECTROLYTES -follow labs as needed -replace as needed -pharmacy consultation and following     Best practice (right click and "Reselect all SmartList  Selections" daily)  Diet:  NPO Pain/Anxiety/Delirium protocol (if indicated): Yes (RASS goal -2) VAP protocol (if indicated): Yes DVT prophylaxis: Systemic AC GI prophylaxis: PPI Central venous access:  Yes, and it is still needed Foley:  Yes, and it is still needed Mobility:   bed rest  Code Status:  FULL CODE Disposition: ICU  Labs   CBC: Recent Labs  Lab 01/11/22 0719 01/12/22 0415 01/13/22 0339 01/14/22 0412  WBC 5.7 12.0* 9.4 8.3  NEUTROABS 3.7  --   --   --   HGB 8.9* 8.6* 7.7* 7.4*  HCT 27.8* 26.7* 24.2* 23.7*  MCV 96.2 96.0 97.6 97.5  PLT 364 411* 312 281     Basic Metabolic Panel: Recent Labs  Lab 01/09/22 0504 01/11/22 0713 01/11/22 0719 01/11/22 1645 01/12/22 0415 01/13/22 0339 01/14/22 0412  NA 132*  --  134*  --  135 134* 136  K 3.4*  --  3.0* 3.6 3.1* 3.6 4.3  CL 99  --  99  --  100 103 102  CO2 25  --  26  --  28 25 27   GLUCOSE 99  --  169*  --  158* 198* 165*  BUN 44*  --  48*  --  46* 41* 35*  CREATININE 1.18*  --  1.57*  --  1.75* 1.42* 1.16*  CALCIUM 8.1*  --  8.8*  --  8.4* 7.9* 8.3*  MG  --  2.4  --   --  2.1 2.3 2.4  PHOS  --   --   --   --  4.2 3.6 3.6    GFR: Estimated Creatinine Clearance: 29.6 mL/min (A) (by C-G formula based on SCr of 1.16 mg/dL (H)). Recent Labs  Lab 01/11/22 0719 01/11/22 0840 01/11/22 1049 01/12/22 0415 01/13/22 0339 01/14/22 0412  PROCALCITON  --  0.46  --  0.32 0.12  --   WBC 5.7  --   --  12.0* 9.4 8.3  LATICACIDVEN  --  1.4 2.3*  --   --   --      Liver Function Tests: Recent Labs  Lab 01/11/22 0719  AST 28  ALT 33  ALKPHOS 66  BILITOT 0.7  PROT 6.5  ALBUMIN 3.3*    No results for input(s): "LIPASE", "AMYLASE" in the last 168 hours. No results for input(s): "AMMONIA" in the last 168 hours.  ABG    Component Value Date/Time   PHART 7.34 (L) 01/11/2022 0825   PCO2ART 46 01/11/2022 0825   PO2ART 126 (H) 01/11/2022 0825   HCO3 24.8 01/11/2022 0825   ACIDBASEDEF 1.3 01/11/2022 0825   O2SAT 99.9 01/11/2022 0825     Coagulation Profile: Recent Labs  Lab 01/11/22 0719 01/11/22 0840  INR 1.0 1.1     Cardiac Enzymes: No results for input(s): "CKTOTAL", "CKMB", "CKMBINDEX", "TROPONINI" in the last 168 hours.  HbA1C: Hgb A1c MFr Bld  Date/Time Value Ref  Range Status  12/06/2021 11:42 AM 6.8 (H) 4.8 - 5.6 % Final    Comment:    (NOTE) Pre diabetes:          5.7%-6.4%  Diabetes:              >6.4%  Glycemic control for   <7.0% adults with diabetes   06/01/2021 09:30 AM 6.7 (H) 4.8 - 5.6 % Final    Comment:    (NOTE)         Prediabetes: 5.7 - 6.4  Diabetes: >6.4         Glycemic control for adults with diabetes: <7.0     CBG: Recent Labs  Lab 01/13/22 2209 01/13/22 2330 01/13/22 2344 01/14/22 0355 01/14/22 0739  GLUCAP 135* 118* 126* 167* 151*     Allergies Allergies  Allergen Reactions   Tetanus Toxoids Swelling    Tetanus and diphtheria toxids   Penicillin G Rash       DVT/GI PRX  assessed I Assessed the need for Labs I Assessed the need for Foley I Assessed the need for Central Venous Line Family Discussion when available I Assessed the need for Mobilization I made an Assessment of medications to be adjusted accordingly Safety Risk assessment completed  CASE DISCUSSED IN MULTIDISCIPLINARY ROUNDS WITH ICU TEAM    PATIENT WITH VERY POOR PROGNOSIS I ANTICIPATE PROLONGED ICU LOS  Patient with Multiorgan failure and at high risk for cardiac arrest and death.

## 2022-01-14 NOTE — Progress Notes (Signed)
[facility] Cardiology    SUBJECTIVE: Intubated sedated   Vitals:   01/14/22 1415 01/14/22 1430 01/14/22 1445 01/14/22 1500  BP: (!) 130/56 (!) 118/47 127/62 (!) 118/50  Pulse: (!) 114 92 100 91  Resp: 17 12 18 12   Temp: 99 F (37.2 C) 99 F (37.2 C) 99 F (37.2 C) 99.1 F (37.3 C)  TempSrc:      SpO2: 98% 98% 97% 97%  Weight:      Height:         Intake/Output Summary (Last 24 hours) at 01/14/2022 1537 Last data filed at 01/14/2022 1505 Gross per 24 hour  Intake 1668.69 ml  Output 1425 ml  Net 243.69 ml      PHYSICAL EXAM  General: Well developed, well nourished, in no acute distress HEENT:  Normocephalic and atramatic Neck:  No JVD.  Lungs: Clear bilaterally to auscultation and percussion. Heart: HRRR . Normal S1 and S2 without gallops or murmurs.  Abdomen: Bowel sounds are positive, abdomen soft and non-tender  Msk:  Back normal, normal gait. Normal strength and tone for age. Extremities: No clubbing, cyanosis or edema.   Neuro: Alert and oriented X 3. Psych:  Good affect, responds appropriately   LABS: Basic Metabolic Panel: Recent Labs    01/13/22 0339 01/14/22 0412  NA 134* 136  K 3.6 4.3  CL 103 102  CO2 25 27  GLUCOSE 198* 165*  BUN 41* 35*  CREATININE 1.42* 1.16*  CALCIUM 7.9* 8.3*  MG 2.3 2.4  PHOS 3.6 3.6   Liver Function Tests: No results for input(s): "AST", "ALT", "ALKPHOS", "BILITOT", "PROT", "ALBUMIN" in the last 72 hours. No results for input(s): "LIPASE", "AMYLASE" in the last 72 hours. CBC: Recent Labs    01/13/22 0339 01/14/22 0412  WBC 9.4 8.3  HGB 7.7* 7.4*  HCT 24.2* 23.7*  MCV 97.6 97.5  PLT 312 281   Cardiac Enzymes: No results for input(s): "CKTOTAL", "CKMB", "CKMBINDEX", "TROPONINI" in the last 72 hours. BNP: Invalid input(s): "POCBNP" D-Dimer: No results for input(s): "DDIMER" in the last 72 hours. Hemoglobin A1C: No results for input(s): "HGBA1C" in the last 72 hours. Fasting Lipid Panel: No results for  input(s): "CHOL", "HDL", "LDLCALC", "TRIG", "CHOLHDL", "LDLDIRECT" in the last 72 hours. Thyroid Function Tests: No results for input(s): "TSH", "T4TOTAL", "T3FREE", "THYROIDAB" in the last 72 hours.  Invalid input(s): "FREET3" Anemia Panel: No results for input(s): "VITAMINB12", "FOLATE", "FERRITIN", "TIBC", "IRON", "RETICCTPCT" in the last 72 hours.  No results found.   Echo   TELEMETRY: Telemetry interpreted by me directly showed normal sinus rhythm nonspecific ST-T wave changes rate of around 70:  ASSESSMENT AND PLAN:  Principal Problem:   NSTEMI (non-ST elevated myocardial infarction) (HCC) Active Problems:   Malnutrition of moderate degree Shortness of breath  1 respiratory failure continue respiratory support wean when appropriate Recommend hospice with comfort measures Overall prognosis is poor Case discussed with family Non-STEMI and multivessel coronary disease continue medical therapy Recommend conservative cardiac input   01/16/22, MD 01/14/2022 3:37 PM

## 2022-01-14 NOTE — Consult Note (Signed)
PHARMACY CONSULT NOTE  Pharmacy Consult for Electrolyte Monitoring and Replacement   Recent Labs: Potassium (mmol/L)  Date Value  01/14/2022 4.3   Magnesium (mg/dL)  Date Value  86/76/7209 2.4   Calcium (mg/dL)  Date Value  47/02/6282 8.3 (L)   Albumin (g/dL)  Date Value  66/29/4765 3.3 (L)   Phosphorus (mg/dL)  Date Value  46/50/3546 3.6   Sodium (mmol/L)  Date Value  01/14/2022 136   Assessment: Patient is an 81 y/o F with medical history including CAD s/p CABG, HTN, HLD, DM, PAD, CHF (EF 45-50%), CKD-3a, RLS, recent admission 01/03/22 thru 01/09/22 for NSTEMI, Vfib cardiac arrest, AKI now presenting to the ED 7/20 with nausea, vomiting, SOB, lethargy. Patient ultimately required intubation.  Labs notable for elevated troponin, BNP, Scr, hypokalemia  Goal of Therapy:  K >= 4, Mg >= 2 All other electrolytes within normal limits  Plan:  Scr 1.75>1.42>1.16; UOP 0.1>0.5>0.68ml/k/h. Net I/O +3.2L. K 3.6>4.3, after KCL IV 10 mEq x 3 on 7/22; (AKI recovering & UOP improved. Note goals d/t recent Vfib arrest.) --No further electrolyte replacement indicated at this time --Follow-up electrolytes again with AM labs tomorrow  Martyn Malay 01/14/2022 8:40 AM

## 2022-01-15 ENCOUNTER — Inpatient Hospital Stay: Payer: Medicare Other

## 2022-01-15 LAB — CBC WITH DIFFERENTIAL/PLATELET
Abs Immature Granulocytes: 0.07 10*3/uL (ref 0.00–0.07)
Basophils Absolute: 0 10*3/uL (ref 0.0–0.1)
Basophils Relative: 0 %
Eosinophils Absolute: 0.1 10*3/uL (ref 0.0–0.5)
Eosinophils Relative: 1 %
HCT: 23.1 % — ABNORMAL LOW (ref 36.0–46.0)
Hemoglobin: 7.2 g/dL — ABNORMAL LOW (ref 12.0–15.0)
Immature Granulocytes: 1 %
Lymphocytes Relative: 9 %
Lymphs Abs: 0.9 10*3/uL (ref 0.7–4.0)
MCH: 30.6 pg (ref 26.0–34.0)
MCHC: 31.2 g/dL (ref 30.0–36.0)
MCV: 98.3 fL (ref 80.0–100.0)
Monocytes Absolute: 0.7 10*3/uL (ref 0.1–1.0)
Monocytes Relative: 7 %
Neutro Abs: 7.9 10*3/uL — ABNORMAL HIGH (ref 1.7–7.7)
Neutrophils Relative %: 82 %
Platelets: 264 10*3/uL (ref 150–400)
RBC: 2.35 MIL/uL — ABNORMAL LOW (ref 3.87–5.11)
RDW: 15 % (ref 11.5–15.5)
WBC: 9.7 10*3/uL (ref 4.0–10.5)
nRBC: 0 % (ref 0.0–0.2)

## 2022-01-15 LAB — COMPREHENSIVE METABOLIC PANEL
ALT: 17 U/L (ref 0–44)
AST: 16 U/L (ref 15–41)
Albumin: 2.6 g/dL — ABNORMAL LOW (ref 3.5–5.0)
Alkaline Phosphatase: 65 U/L (ref 38–126)
Anion gap: 8 (ref 5–15)
BUN: 37 mg/dL — ABNORMAL HIGH (ref 8–23)
CO2: 28 mmol/L (ref 22–32)
Calcium: 7.9 mg/dL — ABNORMAL LOW (ref 8.9–10.3)
Chloride: 102 mmol/L (ref 98–111)
Creatinine, Ser: 1.31 mg/dL — ABNORMAL HIGH (ref 0.44–1.00)
GFR, Estimated: 41 mL/min — ABNORMAL LOW (ref >60)
Glucose, Bld: 188 mg/dL — ABNORMAL HIGH (ref 70–99)
Potassium: 3.7 mmol/L (ref 3.5–5.1)
Sodium: 138 mmol/L (ref 135–145)
Total Bilirubin: 0.7 mg/dL (ref 0.3–1.2)
Total Protein: 5.8 g/dL — ABNORMAL LOW (ref 6.5–8.1)

## 2022-01-15 LAB — GLUCOSE, CAPILLARY
Glucose-Capillary: 127 mg/dL — ABNORMAL HIGH (ref 70–99)
Glucose-Capillary: 132 mg/dL — ABNORMAL HIGH (ref 70–99)
Glucose-Capillary: 154 mg/dL — ABNORMAL HIGH (ref 70–99)
Glucose-Capillary: 155 mg/dL — ABNORMAL HIGH (ref 70–99)
Glucose-Capillary: 162 mg/dL — ABNORMAL HIGH (ref 70–99)
Glucose-Capillary: 169 mg/dL — ABNORMAL HIGH (ref 70–99)
Glucose-Capillary: 208 mg/dL — ABNORMAL HIGH (ref 70–99)

## 2022-01-15 LAB — TROPONIN I (HIGH SENSITIVITY): Troponin I (High Sensitivity): 609 ng/L (ref <18)

## 2022-01-15 LAB — PHOSPHORUS: Phosphorus: 3.9 mg/dL (ref 2.5–4.6)

## 2022-01-15 LAB — MAGNESIUM: Magnesium: 2.2 mg/dL (ref 1.7–2.4)

## 2022-01-15 MED ORDER — ALBUMIN HUMAN 25 % IV SOLN
25.0000 g | Freq: Once | INTRAVENOUS | Status: AC
Start: 2022-01-15 — End: 2022-01-15
  Administered 2022-01-15: 25 g via INTRAVENOUS
  Filled 2022-01-15: qty 100

## 2022-01-15 MED ORDER — PROPOFOL 1000 MG/100ML IV EMUL
INTRAVENOUS | Status: AC
  Administered 2022-01-15: 20 ug/kg/min
  Filled 2022-01-15: qty 100

## 2022-01-15 MED ORDER — ALBUMIN HUMAN 25 % IV SOLN
25.0000 g | Freq: Four times a day (QID) | INTRAVENOUS | Status: DC
Start: 2022-01-15 — End: 2022-01-15

## 2022-01-15 MED ORDER — NITROGLYCERIN 2 % TD OINT
0.5000 [in_us] | TOPICAL_OINTMENT | Freq: Four times a day (QID) | TRANSDERMAL | Status: DC
  Administered 2022-01-15 – 2022-01-16 (×3): 0.5 [in_us] via TOPICAL
  Filled 2022-01-15 (×4): qty 1

## 2022-01-15 MED ORDER — PROPOFOL 1000 MG/100ML IV EMUL
5.0000 ug/kg/min | INTRAVENOUS | Status: DC
  Administered 2022-01-15: 30 ug/kg/min via INTRAVENOUS
  Administered 2022-01-15: 20 ug/kg/min via INTRAVENOUS
  Administered 2022-01-16: 30 ug/kg/min via INTRAVENOUS
  Filled 2022-01-15 (×2): qty 100

## 2022-01-15 NOTE — IPAL (Addendum)
  Interdisciplinary Goals of Care Family Meeting   Date carried out: 01/15/2022  Location of the meeting: Bedside  Member's involved: Physician, Bedside Registered Nurse, and Family Member or next of kin    GOALS OF CARE DISCUSSION  The Clinical status was relayed to family in detail- Daughter and "Nephew" at bedside  Updated and notified of patients medical condition- Patient is having a weak cough and struggling to remove secretions.   Patient with increased WOB and using accessory muscles to breathe Patient with failed SAT/SBT Explained to family course of therapy and the modalities   Patient with Progressive multiorgan failure with a very high probablity of a very minimal chance of meaningful recovery despite all aggressive and optimal medical therapy.  PATIENT REMAINS DNR status  Family understands the situation.  Patient with elevated HR with hypoxia Obtain CXR Consider AMIO/Lasix therapy Follow up cardiology recs EKG c/w possible re-infarction     Family are satisfied with Plan of action and management. All questions answered  Additional CC time 30 mins   Cameren Earnest Santiago Glad, M.D.  [facility]  Medical Director [facility] Medical Director [facility] Cardio-Pulmonary Department

## 2022-01-15 NOTE — Progress Notes (Signed)
[facility] CLINIC CARDIOLOGY CONSULT NOTE       Patient ID: Lindsey Pollard MRN: [id number] DOB/AGE: 80-May-1942 80 y.o.  Admit date: 01/11/2022 Referring Physician Dr. Willy Eddy Primary Physician Dr. Einar Crow  Primary Cardiologist Dr. Juliann Pares Reason for Consultation NSTEMI  HPI: Lindsey Pollard is an 32yoF with a PMH of CAD s/p CABG x2 in 2018 (patent LIMA to LAD,  occluded SVG to OM1 by [facility] 05/2021), h/o PCI x1 distal RCA (50% ISR by [facility] 05/2021), ischemic CM with LVEF with moderate hypo of LV mid apical anterolateral wall, 55-60%, g1dd, mod MR 12/2021, CKD 3, hyperlipidemia, type 2 diabetes, CAD, GERD who was recently hospitalized at [facility] from 7/12 - 7/18 initially for shortness of breath and developed chest pain, VF arrest with successful resuscitation and her NSTEMI was treated medically.  She presents to [facility] ED 2 days after discharge with acute encephalopathy, nausea, vomiting, and was intubated for airway protection in the ED. cardiology was consulted due to her significant cardiac history and concern for NSTEMI.  Interval History:  -Remained intubated and sedated through the weekend, failed SBT over the weekend. Became tachycardic during SBT today, repeat EKG showed rate related ST changes, improved as HR slowed.  -Off vasopressor support -reportedly has chest pain with weaning trials, seen clutching her chest -Hgb downtrending to 7.2 today   Review of systems limited as patient is intubated and sedated   Past Medical History:  Diagnosis Date   Chest pain 12/06/2021   Diabetes mellitus without complication (HCC)    Hyperlipidemia    Hypertension    Myocardial infarction [facility])     Past Surgical History:  Procedure Laterality Date   CARDIAC SURGERY     bypass   CORONARY/GRAFT ACUTE MI REVASCULARIZATION N/A 06/01/2021   Procedure: Coronary/Graft Acute MI Revascularization;  Surgeon: Alwyn Pea, MD;  Location: [facility] INVASIVE CV LAB;  Service: Cardiovascular;   Laterality: N/A;   EYE SURGERY     cataract extraction   LEFT HEART CATH AND CORONARY ANGIOGRAPHY N/A 06/01/2021   Procedure: LEFT HEART CATH AND CORONARY ANGIOGRAPHY;  Surgeon: Alwyn Pea, MD;  Location: [facility] INVASIVE CV LAB;  Service: Cardiovascular;  Laterality: N/A;    Medications Prior to Admission  Medication Sig Dispense Refill Last Dose   acetaminophen (TYLENOL) 325 MG tablet Take 650 mg by mouth at bedtime.   PRN at PRN   albuterol (VENTOLIN HFA) 108 (90 Base) MCG/ACT inhaler Inhale 1-2 puffs into the lungs every 4 (four) hours as needed for shortness of breath.      aspirin 81 MG EC tablet Take by mouth.      atorvastatin (LIPITOR) 80 MG tablet Take 80 mg by mouth every evening.      baclofen (LIORESAL) 10 MG tablet Take 10 mg by mouth 3 (three) times daily.      Cholecalciferol 25 MCG (1000 UT) capsule Take 1,000 Units by mouth 2 (two) times daily.      clopidogrel (PLAVIX) 75 MG tablet Take 1 tablet (75 mg total) by mouth daily. 30 tablet 0    feeding supplement (ENSURE ENLIVE / ENSURE PLUS) LIQD Take 237 mLs by mouth 3 (three) times daily between meals. 237 mL 12    insulin glargine (LANTUS) 100 UNIT/ML injection Inject 8 Units into the skin at bedtime.      insulin lispro (HUMALOG) 100 UNIT/ML KwikPen Inject 0-2 Units into the skin 3 (three) times daily.      ipratropium-albuterol (DUONEB) 0.5-2.5 (3) MG/3ML SOLN  Take 3 mLs by nebulization every 6 (six) hours as needed (shortness of breath).      isosorbide mononitrate (IMDUR) 30 MG 24 hr tablet Take 1 tablet (30 mg total) by mouth daily. 30 tablet 1    lisinopril (ZESTRIL) 20 MG tablet Take 1 tablet (20 mg total) by mouth daily. 30 tablet 1    lisinopril-hydrochlorothiazide (ZESTORETIC) 10-12.5 MG tablet Take 1 tablet by mouth daily.      metoprolol succinate (TOPROL-XL) 50 MG 24 hr tablet Take 50 mg by mouth daily.      Multiple Vitamin (MULTIVITAMIN WITH MINERALS) TABS tablet Take 1 tablet by mouth daily.       nitroGLYCERIN (NITROSTAT) 0.4 MG SL tablet Place 0.4 mg under the tongue every 5 (five) minutes as needed for chest pain.      ondansetron (ZOFRAN-ODT) 4 MG disintegrating tablet Take 4 mg by mouth every 8 (eight) hours as needed.      senna (SENOKOT) 8.6 MG tablet Take 2 tablets by mouth at bedtime.      SPIRIVA HANDIHALER 18 MCG inhalation capsule Place 1 capsule into inhaler and inhale daily as needed for shortness of breath.      spironolactone (ALDACTONE) 25 MG tablet Take 12.5 mg by mouth daily.      torsemide (DEMADEX) 20 MG tablet Take 20 mg by mouth daily.      Social History   Socioeconomic History   Marital status: Divorced    Spouse name: Not on file   Number of children: Not on file   Years of education: Not on file   Highest education level: Not on file  Occupational History   Not on file  Tobacco Use   Smoking status: Former    Types: Cigarettes   Smokeless tobacco: Never  Substance and Sexual Activity   Alcohol use: Not Currently   Drug use: Not Currently   Sexual activity: Not on file  Other Topics Concern   Not on file  Social History Narrative   Not on file   Social Determinants of Health   Financial Resource Strain: Not on file  Food Insecurity: Not on file  Transportation Needs: Not on file  Physical Activity: Not on file  Stress: Not on file  Social Connections: Not on file  Intimate Partner Violence: Not on file    Family History  Problem Relation Age of Onset   Congestive Heart Failure Mother    Diabetes Father    Breast cancer Neg Hx       PHYSICAL EXAM General: Elderly and chronically ill-appearing Caucasian female intubated lying in ICU bed with daughter at bedside  HEENT:  Normocephalic and atraumatic. Neck:  No JVD.  Lungs: Mechanically ventilated breath sounds, coarse to auscultation anteriorly Heart: HRRR . Normal S1 and S2 with 3/6 systolic murmur heard throughout.  Radial pulses nonpalpable Abdomen: Non-distended appearing.  Msk:  Normal strength and tone for age. Extremities: Warm and well perfused. No clubbing, cyanosis.  No peripheral edema.  Neuro: Intubated and sedated, unable to assess Psych: Intubated and sedated, unable to assess  Labs:   Lab Results  Component Value Date   WBC 9.7 01/15/2022   HGB 7.2 (L) 01/15/2022   HCT 23.1 (L) 01/15/2022   MCV 98.3 01/15/2022   PLT 264 01/15/2022    Recent Labs  Lab 01/15/22 0456  NA 138  K 3.7  CL 102  CO2 28  BUN 37*  CREATININE 1.31*  CALCIUM 7.9*  PROT 5.8*  BILITOT  0.7  ALKPHOS 65  ALT 17  AST 16  GLUCOSE 188*    No results found for: "CKTOTAL", "CKMB", "CKMBINDEX", "TROPONINI"  Lab Results  Component Value Date   CHOL 102 12/07/2021   CHOL 127 06/01/2021   Lab Results  Component Value Date   HDL 31 (L) 12/07/2021   HDL 47 06/01/2021   Lab Results  Component Value Date   LDLCALC 45 12/07/2021   LDLCALC 49 06/01/2021   Lab Results  Component Value Date   TRIG 101 01/04/2022   TRIG 128 12/07/2021   TRIG 154 (H) 06/01/2021   Lab Results  Component Value Date   CHOLHDL 3.3 12/07/2021   CHOLHDL 2.7 06/01/2021   No results found for: "LDLDIRECT"    Radiology: ECHOCARDIOGRAM LIMITED  Result Date: 01/12/2022    ECHOCARDIOGRAM LIMITED REPORT   Patient Name:   Lindsey Pollard [facility] Date of Exam: 01/12/2022 Medical Rec #:  [id number]     Height:       59.0 in Accession #:    [id number]    Weight:       140.4 lb Date of Birth:  Jan 25, 1941     BSA:          1.587 m Patient Age:    63 years      BP:           146/47 mmHg Patient Gender: F             HR:           51 bpm. Exam Location:  [facility] Procedure: Limited Echo, Cardiac Doppler, Color Doppler and Intracardiac            Opacification Agent Indications:     Elevated Troponin  History:         Patient has prior history of Echocardiogram examinations, most                  recent 01/04/2022.  Sonographer:     Sherrie Sport Referring Phys:  [id number] [place] Breckon Reeves Diagnosing Phys: Yolonda Kida MD   Sonographer Comments: Suboptimal parasternal window and suboptimal apical window. IMPRESSIONS  1. Left ventricular ejection fraction, by estimation, is 45 to 50%. The left ventricle has mildly decreased function. The left ventricle demonstrates global hypokinesis. The left ventricular internal cavity size was mildly dilated. Left ventricular diastolic function could not be evaluated.  2. Right ventricular systolic function is normal.  3. Moderate pleural effusion in the left lateral region.  4. The mitral valve is normal in structure.  5. The aortic valve is normal in structure. Aortic valve regurgitation is not visualized. Conclusion(s)/Recommendation(s): Poor windows for evaluation of left ventricular function by transthoracic echocardiography. Would recommend an alternative means of evaluation. FINDINGS  Left Ventricle: Left ventricular ejection fraction, by estimation, is 45 to 50%. The left ventricle has mildly decreased function. The left ventricle demonstrates global hypokinesis. Definity contrast agent was given IV to delineate the left ventricular  endocardial borders. The left ventricular internal cavity size was mildly dilated. There is no left ventricular hypertrophy. Left ventricular diastolic function could not be evaluated. Right Ventricle: No increase in right ventricular wall thickness. Right ventricular systolic function is normal. Left Atrium: Left atrial size was normal in size. Right Atrium: Right atrial size was normal in size. Mitral Valve: The mitral valve is normal in structure. Tricuspid Valve: The tricuspid valve is normal in structure. Aortic Valve: The aortic valve is normal in structure. Aortic valve regurgitation is  not visualized. Pulmonic Valve: The pulmonic valve was normal in structure. Aorta: The ascending aorta was not well visualized. IAS/Shunts: No atrial level shunt detected by color flow Doppler. Additional Comments: There is a moderate pleural effusion in the left lateral  region. LEFT VENTRICLE PLAX 2D LVIDd:         4.70 cm LVIDs:         3.40 cm LV PW:         1.10 cm LV IVS:        0.90 cm LVOT diam:     2.00 cm LVOT Area:     3.14 cm  LEFT ATRIUM         Index LA diam:    3.00 cm 1.89 cm/m   AORTA Ao Root diam: 2.90 cm TRICUSPID VALVE TR Peak grad:   28.9 mmHg TR Vmax:        269.00 cm/s  SHUNTS Systemic Diam: 2.00 cm Alwyn Pea MD Electronically signed by Alwyn Pea MD Signature Date/Time: 01/12/2022/2:52:11 PM    Final    DG Chest Port 1 View  Result Date: 01/11/2022 CLINICAL DATA:  Central line placement. EXAM: PORTABLE CHEST 1 VIEW COMPARISON:  Radiograph and CT earlier today. FINDINGS: New left internal jugular central venous catheter tip overlies the atrial caval junction. No pneumothorax. Stable endotracheal and enteric tubes. Stable heart size and mediastinal contours. Small bilateral pleural effusions, increasing on the left. No pulmonary edema or new airspace disease. IMPRESSION: 1. New left internal jugular central venous catheter with tip over the atrial caval junction. No pneumothorax. 2. Stable endotracheal and enteric tubes. 3. Small bilateral pleural effusions, increasing on the left. Electronically Signed   By: Narda Rutherford M.D.   On: 01/11/2022 18:41   CT HEAD WO CONTRAST ( )  Result Date: 01/11/2022 CLINICAL DATA:  Altered mental status EXAM: CT HEAD WITHOUT CONTRAST TECHNIQUE: Contiguous axial images were obtained from the base of the skull through the vertex without intravenous contrast. RADIATION DOSE REDUCTION: This exam was performed according to the departmental dose-optimization program which includes automated exposure control, adjustment of the mA and/or kV according to patient size and/or use of iterative reconstruction technique. COMPARISON:  01/03/2022 FINDINGS: Brain: No acute intracranial findings are seen in noncontrast CT brain. There are no signs of bleeding within the cranium. Cortical sulci are prominent.  Vascular: Unremarkable. Skull: Unremarkable Sinuses/Orbits: Unremarkable. Other: None. IMPRESSION: No acute intracranial findings are seen.  Atrophy. Electronically Signed   By: Ernie Avena M.D.   On: 01/11/2022 08:28   CT CHEST ABDOMEN PELVIS WO CONTRAST  Result Date: 01/11/2022 CLINICAL DATA:  Shortness of breath, altered mental status EXAM: CT CHEST, ABDOMEN AND PELVIS WITHOUT CONTRAST TECHNIQUE: Multidetector CT imaging of the chest, abdomen and pelvis was performed following the standard protocol without IV contrast. RADIATION DOSE REDUCTION: This exam was performed according to the departmental dose-optimization program which includes automated exposure control, adjustment of the mA and/or kV according to patient size and/or use of iterative reconstruction technique. COMPARISON:  01/03/2022 FINDINGS: CT CHEST FINDINGS Cardiovascular: Extensive coronary artery calcifications are seen. There is evidence of previous coronary bypass surgery. Scattered calcifications are seen in thoracic aorta and its major branches. Mediastinum/Nodes: Slightly enlarged lymph nodes are seen in mediastinum with no significant interval change. Tip of endotracheal tube is at the carina and should be pulled back 2-3 cm. Lungs/Pleura: Small patchy infiltrates are seen in left parahilar region and posterior lower lung fields. There is interval appearance of small  bilateral pleural effusions. There is no pneumothorax. There is slight thickening of interlobular septi. Musculoskeletal: No acute findings are seen. CT ABDOMEN PELVIS FINDINGS Hepatobiliary: No focal abnormalities are seen in liver. There is no dilation of bile ducts. Gallbladder is distended, possibly due to fasting state. There is no wall thickening in gallbladder. Gallbladder stones are seen. Pancreas: No focal abnormalities are seen. Diverticulum is noted along the inner margin of second portion of duodenum. Spleen: Unremarkable. Adrenals/Urinary Tract: Adrenals  are unremarkable. Left kidney is much smaller than right. This may be a congenital variation or suggest renal artery stenosis or chronic pyelonephritis. There is no hydronephrosis. There are no renal or ureteral stones. Foley catheter is seen in the bladder. Stomach/Bowel: Tip of enteric tube is seen in the stomach. There is no dilation of small bowel loops. Appendix is unremarkable. There is no significant wall thickening in colon. Scattered diverticula are seen in the colon without signs of focal acute diverticulitis. Vascular/Lymphatic: Extensive arterial calcifications are seen. Reproductive: Coarse calcifications seen in pelvis may suggest calcified uterine fibroids. Other: There is no ascites or pneumoperitoneum. Musculoskeletal: Degenerative changes are noted in lumbar spine, particularly severe at L2-L3 and L3-L4 levels. This finding has not changed significantly. IMPRESSION: Tip of endotracheal tube is at the carina and should be pulled back 2-3 cm. There is interval appearance of small bilateral pleural effusions, more so on the left side. There is thickening of interlobular septi suggesting possible interstitial pulmonary edema. There are patchy densities in the left parahilar region and both lower lung fields which may be due to pulmonary edema or pneumonia. Extensive coronary artery calcifications are seen. There is no evidence of intestinal obstruction or pneumoperitoneum. There is no hydronephrosis. Gallbladder stones. Diverticulosis of colon without signs of diverticulitis. Other findings as described in the body of the report. Electronically Signed   By: Ernie Avena M.D.   On: 01/11/2022 08:27   DG Abd Portable 1 View  Result Date: 01/11/2022 CLINICAL DATA:  OG tube placement. EXAM: PORTABLE ABDOMEN - 1 VIEW COMPARISON:  AP chest 01/11/2022 at 704 hours FINDINGS: New orogastric tube with side port overlying the proximal stomach and tip overlying the mid aspect of the greater curvature.  Nonobstructed upper abdominal bowel-gas pattern. No portal venous gas or pneumatosis. Severe left L3-4 disc space narrowing and moderate to severe diffuse L4-5 disc space narrowing. Mild dextrocurvature centered at L3-4. IMPRESSION: Orogastric tube in appropriate position. Electronically Signed   By: Neita Garnet M.D.   On: 01/11/2022 08:11   DG Chest Portable 1 View  Result Date: 01/11/2022 CLINICAL DATA:  Tube placement.  Encounter for intubation. EXAM: PORTABLE CHEST 1 VIEW COMPARISON:  AP chest 01/11/2022 at 704 hours FINDINGS: AP chest 01/11/2022 at 735 hours. Interval placement of endotracheal tube with tip terminating imaging of the thoracic inlet and the carina proximally 3.2 cm above the carina. Enteric tube descends below the diaphragm with the side port overlying the proximal stomach and the tip excluded by inferior collimation, new from prior. Postsurgical changes are again seen of median sternotomy and CABG. Cardiac silhouette and mediastinal contours are within normal limits. Moderate calcification within the aortic arch. Mild left basilar linear likely subsegmental atelectasis. No definite pleural effusion. No pneumothorax is seen. No acute skeletal abnormality. IMPRESSION: 1. New endotracheal tube and enteric tube in appropriate position. 2. No acute lung process. 3.  Aortic Atherosclerosis (ICD10-I70.0). Electronically Signed   By: Neita Garnet M.D.   On: 01/11/2022 08:10   DG Chest  Portable 1 View  Result Date: 01/11/2022 CLINICAL DATA:  Provided history: New onset dyspnea, recent cardiac event. EXAM: PORTABLE CHEST 1 VIEW COMPARISON:  Prior chest radiographs 01/04/2022 and earlier. FINDINGS: Prior median sternotomy/CABG. Heart size within normal limits. Aortic atherosclerosis. No appreciable airspace consolidation or pulmonary edema. No evidence of pleural effusion or pneumothorax. No acute bony abnormality identified. IMPRESSION: No evidence of acute cardiopulmonary abnormality. Aortic  Atherosclerosis (ICD10-I70.0). Electronically Signed   By: Kellie Simmering D.O.   On: 01/11/2022 08:03   ECHOCARDIOGRAM LIMITED  Result Date: 01/04/2022    ECHOCARDIOGRAM LIMITED REPORT   Patient Name:   Lindsey Pollard [facility] Date of Exam: 01/04/2022 Medical Rec #:  [id number]     Height:       59.0 in Accession #:    [id number]    Weight:       127.4 lb Date of Birth:  07-Dec-1940     BSA:          1.523 m Patient Age:    82 years      BP:           128/50 mmHg Patient Gender: F             HR:           54 bpm. Exam Location:  [facility] Procedure: Limited Echo, Color Doppler and Cardiac Doppler Indications:     Cardiac Areest I46.9  History:         Patient has prior history of Echocardiogram examinations, most                  recent 12/07/2021. Previous Myocardial Infarction,                  Signs/Symptoms:Chest Pain; Risk Factors:Diabetes and                  Hypertension.  Sonographer:     Sherrie Sport Referring Phys:  [id number] [place] Audree Schrecengost Diagnosing Phys: Serafina Royals MD  Sonographer Comments: Image acquisition challenging due to patient behavioral factors. IMPRESSIONS  1. The left ventricle demonstrates regional wall motion abnormalities (see scoring diagram/findings for description).  2. Right ventricular systolic function is normal. The right ventricular size is normal.  3. Left atrial size was moderately dilated.  4. The mitral valve is normal in structure. Moderate mitral valve regurgitation.  5. Tricuspid valve regurgitation is mild to moderate.  6. The aortic valve is normal in structure. Aortic valve regurgitation is mild. FINDINGS  Left Ventricle: The left ventricle demonstrates regional wall motion abnormalities. Moderate hypokinesis of the left ventricular, mid-apical anterolateral wall. Right Ventricle: The right ventricular size is normal. No increase in right ventricular wall thickness. Right ventricular systolic function is normal. Left Atrium: Left atrial size was moderately dilated. Right Atrium:  Right atrial size was normal in size. Pericardium: There is no evidence of pericardial effusion. Mitral Valve: The mitral valve is normal in structure. Moderate mitral valve regurgitation. Tricuspid Valve: The tricuspid valve is normal in structure. Tricuspid valve regurgitation is mild to moderate. Aortic Valve: The aortic valve is normal in structure. Aortic valve regurgitation is mild. Pulmonic Valve: The pulmonic valve was normal in structure. Pulmonic valve regurgitation is trivial. Aorta: The aortic root and ascending aorta are structurally normal, with no evidence of dilitation. IAS/Shunts: No atrial level shunt detected by color flow Doppler. LEFT VENTRICLE PLAX 2D LVIDd:         4.40 cm LVIDs:  3.20 cm LV PW:         1.10 cm LV IVS:        1.00 cm LVOT diam:     2.00 cm LVOT Area:     3.14 cm  LEFT ATRIUM         Index LA diam:    3.40 cm 2.23 cm/m   AORTA Ao Root diam: 2.90 cm  SHUNTS Systemic Diam: 2.00 cm Serafina Royals MD Electronically signed by Serafina Royals MD Signature Date/Time: 01/04/2022/4:56:35 PM    Final    DG Chest 1 View  Result Date: 01/04/2022 CLINICAL DATA:  81 year old female intubated.  Post CPR. EXAM: CHEST  1 VIEW COMPARISON:  Portable chest 01/03/2022 and earlier. FINDINGS: Portable AP semi upright view at 0611 hours. Endotracheal tube tip partially obscured by sternotomy hardware, but appears to be in good position between the level the clavicles and carina. Enteric tube courses into the abdomen, tip is not included. Stable pacer or resuscitation pads over the left chest. Stable lung volumes. Mediastinal contours remain normal. Allowing for portable technique the lungs are clear. No pneumothorax or pleural effusion identified. Negative visible bowel gas. Stable visualized osseous structures. IMPRESSION: 1. Satisfactory ET tube position. Enteric tube courses to the abdomen, tip not included. 2. No acute cardiopulmonary abnormality. Electronically Signed   By: Genevie Ann  M.D.   On: 01/04/2022 07:26   DG Chest Portable 1 View  Result Date: 01/03/2022 CLINICAL DATA:  Intubation, post CPR EXAM: PORTABLE CHEST 1 VIEW COMPARISON:  01/03/2022 FINDINGS: Endotracheal tube is in the right mainstem bronchus. Recommend retracting approximately 5 cm. NG tube enters the stomach. Prior CABG. Heart is normal size. Increasing airspace disease throughout the left lung which may reflect atelectasis due to right mainstem intubation. Similar right upper lobe opacity/atelectasis. No effusions. IMPRESSION: Right mainstem intubation. Recommend retracting endotracheal tube approximately 5 cm. Areas of airspace disease in the left lung and right upper lobe may be related to atelectasis from right mainstem intubation. These results were called by telephone at the time of interpretation on 01/03/2022 at 10:21 pm to provider [facility] , who verbally acknowledged these results. Electronically Signed   By: Rolm Baptise M.D.   On: 01/03/2022 22:24   CT Cervical Spine Wo Contrast  Result Date: 01/03/2022 CLINICAL DATA:  Neck trauma (Age >= 65y) EXAM: CT CERVICAL SPINE WITHOUT CONTRAST TECHNIQUE: Multidetector CT imaging of the cervical spine was performed without intravenous contrast. Multiplanar CT image reconstructions were also generated. RADIATION DOSE REDUCTION: This exam was performed according to the departmental dose-optimization program which includes automated exposure control, adjustment of the mA and/or kV according to patient size and/or use of iterative reconstruction technique. COMPARISON:  None Available. FINDINGS: Alignment: Normal Skull base and vertebrae: No acute fracture. No primary bone lesion or focal pathologic process. Soft tissues and spinal canal: No prevertebral fluid or swelling. No visible canal hematoma. 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 Mild bilateral degenerative facet disease. Upper chest: No acute findings  Other: None IMPRESSION: Degenerative disc and facet disease.  No acute bony abnormality. Electronically Signed   By: Rolm Baptise M.D.   On: 01/03/2022 20:10   CT HEAD WO CONTRAST (5MM)  Result Date: 01/03/2022 CLINICAL DATA:  Head trauma, minor (Age >= 65y) EXAM: CT HEAD WITHOUT CONTRAST TECHNIQUE: Contiguous axial images were obtained from the base of the skull through the vertex without intravenous contrast. RADIATION DOSE REDUCTION: This exam  was performed according to the departmental dose-optimization program which includes automated exposure control, adjustment of the mA and/or kV according to patient size and/or use of iterative reconstruction technique. COMPARISON:  10/03/2021 FINDINGS: Brain: No acute intracranial abnormality. Specifically, no hemorrhage, hydrocephalus, mass lesion, acute infarction, or significant intracranial injury. Vascular: No hyperdense vessel or unexpected calcification. Skull: No acute calvarial abnormality. Sinuses/Orbits: No acute findings Other: None IMPRESSION: Normal study for patient's age. Electronically Signed   By: Rolm Baptise M.D.   On: 01/03/2022 20:08   CT CHEST ABDOMEN PELVIS WO CONTRAST  Result Date: 01/03/2022 CLINICAL DATA:  Pneumonia, complication suspected, xray done. Shortness of breath. EXAM: CT CHEST, ABDOMEN AND PELVIS WITHOUT CONTRAST TECHNIQUE: Multidetector CT imaging of the chest, abdomen and pelvis was performed following the standard protocol without IV contrast. RADIATION DOSE REDUCTION: This exam was performed according to the departmental dose-optimization program which includes automated exposure control, adjustment of the mA and/or kV according to patient size and/or use of iterative reconstruction technique. COMPARISON:  None Available. FINDINGS: CT CHEST FINDINGS Cardiovascular: Heavily calcified aorta and coronary arteries. Prior CABG. Heart is normal size. Aorta is normal caliber. Mediastinum/Nodes: No mediastinal, hilar, or axillary  adenopathy. Trachea and esophagus are unremarkable. Thyroid unremarkable. Lungs/Pleura: Lungs are clear. No focal airspace opacities or suspicious nodules. No effusions. Musculoskeletal: Chest wall soft tissues are unremarkable. No acute bony abnormality. CT ABDOMEN PELVIS FINDINGS Hepatobiliary: Small gallstone within the gallbladder. No focal hepatic abnormality. Pancreas: No focal abnormality or ductal dilatation. Spleen: No focal abnormality.  Normal size. Adrenals/Urinary Tract: Left kidney is atrophic. No renal or adrenal mass. No stones or hydronephrosis. Urinary bladder unremarkable. Stomach/Bowel: Left colonic diverticulosis. No active diverticulitis. Stomach and small bowel decompressed, unremarkable. Vascular/Lymphatic: Heavily calcified aorta. No evidence of aneurysm or adenopathy. Reproductive: Calcified fibroids in the uterus.  No adnexal masses. Other: No free fluid or free air. Musculoskeletal: No acute bony abnormality. Scoliosis and degenerative changes in the lumbar spine. IMPRESSION: No acute cardiopulmonary disease. Diffuse aortoiliac atherosclerosis.  No aneurysm. Cholelithiasis. Left colonic diverticulosis.  No active diverticulitis. No acute findings in the abdomen or pelvis. Electronically Signed   By: Rolm Baptise M.D.   On: 01/03/2022 20:05   DG Chest 2 View  Result Date: 01/03/2022 CLINICAL DATA:  Shortness of breath EXAM: CHEST - 2 VIEW COMPARISON:  12/06/2021 FINDINGS: Prior CABG. Heart and mediastinal contours are within normal limits. No focal opacities or effusions. No acute bony abnormality. IMPRESSION: No active cardiopulmonary disease. Electronically Signed   By: Rolm Baptise M.D.   On: 01/03/2022 19:25    ECHO 01/04/2022  1. The left ventricle demonstrates regional wall motion abnormalities  (see scoring diagram/findings for description).   2. Right ventricular systolic function is normal. The right ventricular  size is normal.   3. Left atrial size was moderately  dilated.   4. The mitral valve is normal in structure. Moderate mitral valve  regurgitation.   5. Tricuspid valve regurgitation is mild to moderate.   6. The aortic valve is normal in structure. Aortic valve regurgitation is  mild.   FINDINGS   Left Ventricle: The left ventricle demonstrates regional wall motion  abnormalities. Moderate hypokinesis of the left ventricular, mid-apical  anterolateral wall.   Right Ventricle: The right ventricular size is normal. No increase in  right ventricular wall thickness. Right ventricular systolic function is  normal.   Left Atrium: Left atrial size was moderately dilated.   Right Atrium: Right atrial size was normal in size.  Pericardium: There is no evidence of pericardial effusion.   Mitral Valve: The mitral valve is normal in structure. Moderate mitral  valve regurgitation.   Tricuspid Valve: The tricuspid valve is normal in structure. Tricuspid  valve regurgitation is mild to moderate.   Aortic Valve: The aortic valve is normal in structure. Aortic valve  regurgitation is mild.   Pulmonic Valve: The pulmonic valve was normal in structure. Pulmonic valve  regurgitation is trivial.   Aorta: The aortic root and ascending aorta are structurally normal, with  no evidence of dilitation.   IAS/Shunts: No atrial level shunt detected by color flow Doppler.   12/07/2021  1. Left ventricular ejection fraction, by estimation, is 55 to 60%. The  left ventricle has normal function. The left ventricle has no regional  wall motion abnormalities. Left ventricular diastolic parameters are  consistent with Grade I diastolic  dysfunction (impaired relaxation).   2. Right ventricular systolic function is normal. The right ventricular  size is normal.   3. The mitral valve is normal in structure. Moderate mitral valve  regurgitation.   4. The aortic valve is normal in structure. Aortic valve regurgitation is  mild.   FINDINGS   Left  Ventricle: Left ventricular ejection fraction, by estimation, is 55  to 60%. The left ventricle has normal function. The left ventricle has no  regional wall motion abnormalities. The left ventricular internal cavity  size was normal in size. There is   no left ventricular hypertrophy. Left ventricular diastolic parameters  are consistent with Grade I diastolic dysfunction (impaired relaxation).   Right Ventricle: The right ventricular size is normal. No increase in  right ventricular wall thickness. Right ventricular systolic function is  normal.   Left Atrium: Left atrial size was normal in size.   Right Atrium: Right atrial size was normal in size.   Pericardium: There is no evidence of pericardial effusion.   Mitral Valve: The mitral valve is normal in structure. Moderate mitral  valve regurgitation. MV peak gradient, 4.8 mmHg. The mean mitral valve  gradient is 1.0 mmHg.   Tricuspid Valve: The tricuspid valve is grossly normal. Tricuspid valve  regurgitation is mild.   Aortic Valve: The aortic valve is normal in structure. Aortic valve  regurgitation is mild. Aortic regurgitation PHT measures 567 msec. Aortic  valve mean gradient measures 10.0 mmHg. Aortic valve peak gradient  measures 16.9 mmHg. Aortic valve area, by VTI   measures 1.12 cm.   Pulmonic Valve: The pulmonic valve was normal in structure. Pulmonic valve  regurgitation is not visualized.   Aorta: The ascending aorta was not well visualized.   IAS/Shunts: No atrial level shunt detected by color flow Doppler.   TELEMETRY reviewed by me: sinus rhythm rate 70s, multiple short runs of NSVT this morning.  EKG reviewed by me: sinus bradycardia 57, lateral ST depressions V5-6, improving on repeat. LBBB  ASSESSMENT AND PLAN:  Lindsey Pollard is an 10yoF with a PMH of CAD s/p CABG x2 in 2018 (patent LIMA to LAD,  occluded SVG to OM1 by [facility] 05/2021), h/o PCI x1 distal RCA (50% ISR by [facility] 05/2021), ischemic CM with LVEF  with moderate hypo of LV mid apical anterolateral wall, 55-60%, g1dd, mod MR 12/2021, CKD 3, hyperlipidemia, type 2 diabetes, CAD, GERD who was recently hospitalized at [facility] from 7/12 - 7/18 initially for shortness of breath and developed chest pain, VF arrest with successful resuscitation and her NSTEMI was treated medically.  She presents to [facility] ED 2  days after discharge with acute encephalopathy, nausea, vomiting, and was intubated for airway protection in the ED. cardiology was consulted due to her significant cardiac history and concern for NSTEMI.  #Acute encephalopathy #recent NSTEMI 01/04/2022 - h/o significant CAD s/p CABG x2 with known occluded SVG to OM1, LIMA to LAD patent by [facility] 05/2021 #Ischemic cardiomyopathy #Acute hypoxic respiratory failure Patient has a complicated and significant history of CAD with multiple admissions over the past year for chest pain and has been medically managed due to patient preference.  She was most recently hospitalized last week with chest pain, VF arrest with ROSC requiring intubation.  Initial EKG showed significant lateral ST depressions and echo showed a new LV mid apical anterolateral hypokinesis.  Her NSTEMI was ultimately treated medically.  She presents again 2 days after discharge with confusion and acting strangely with multiple episodes of emesis.  Her troponin is downtrending from last admission at 2206-1700 (compared to a peak of 6300 previously). Her EKG shows lateral ST depressions and LBBB, improving on repeats. UA is positive for leukocytes she is being treated for possible aspiration pneumonia.  -Agree with current therapy per primary team -Give 325 mg aspirin, continue DAPT with aspirin 81 mg and 75 mg clopidogrel daily. -S/p heparin drip for 48 hours, ended 7/22 -start nitropaste 0.5inch q6h today  -Continue atorvastatin 80 mg daily -S/p Levophed and vasopressin  -Restart her home antianginals/antihypertensives as her blood pressure  allows: Metoprolol succinate 50 mg XL, isosorbide 30 mg daily. -Hold torsemide 20 mg, spironolactone 12.5 mg, lisinopril 20 mg due to AKI -Limited echo resulted with an EF of 45 to 50% with global hypokinesis, LV dilation -Hopefully she can be extubated and further conversations regarding goals of care can occur.  She is a DNR and has failed SBT the past 3 days.  Family understands limited role for invasive evaluation and the poor prognosis..  This patient's plan of care was discussed and created with Dr. Saralyn Pilar and he is in agreement.  Signed: Tristan Schroeder , PA-C 01/15/2022, 9:45 AM [facility]

## 2022-01-15 NOTE — Consult Note (Signed)
PHARMACY CONSULT NOTE  Pharmacy Consult for Electrolyte Monitoring and Replacement   Recent Labs: Potassium (mmol/L)  Date Value  01/15/2022 3.7   Magnesium (mg/dL)  Date Value  23/76/2831 2.2   Calcium (mg/dL)  Date Value  51/76/1607 7.9 (L)   Albumin (g/dL)  Date Value  37/03/6268 2.6 (L)   Phosphorus (mg/dL)  Date Value  48/54/6270 3.9   Sodium (mmol/L)  Date Value  01/15/2022 138   Assessment: Patient is an 81 y/o F with medical history including CAD s/p CABG, HTN, HLD, DM, PAD, CHF (EF 45-50%), CKD-3a, RLS, recent admission 01/03/22 thru 01/09/22 for NSTEMI, Vfib cardiac arrest, AKI now presenting to the ED 7/20 with nausea, vomiting, SOB, lethargy. Patient ultimately required intubation.  Labs on admission notable for elevated troponin, BNP, Scr, hypokalemia  Goal of Therapy:  K >= 4, Mg >= 2 All other electrolytes within normal limits  Plan:  --No electrolyte replacement indicated at this time --Follow-up electrolytes again with AM labs tomorrow  Tressie Ellis 01/15/2022 7:32 AM

## 2022-01-15 NOTE — Progress Notes (Signed)
NAME:  Lindsey Pollard, MRN:  [id number], DOB:  December 22, 1940, LOS: 4 ADMISSION DATE:  01/11/2022  Brief Pt Description / Synopsis:  81 year old female admitted with acute metabolic encephalopathy and acute respiratory failure in the setting of acute on chronic HFpEF, elevated troponins (NSTEMI versus demand ischemia), UTI and presumed aspiration.  intubation for airway protection and multiorgan failure   History of Present Illness:  81 year old female with a past medical history significant for recent V-fib arrest, HFpEF, ischemic cardiomyopathy, CAD status post CABG x2 in 2018 and PCI, chronic kidney disease stage III, diabetes mellitus who presents to [facility] ED on 01/11/2022 due to altered mental status, respiratory distress, and nausea/vomiting.  Patient is currently intubated and sedated, therefore history is obtained from patient's daughter at bedside along with chart review.   Per the patient's daughter, the patient was recently admitted from 7/12 through 7/18 for brief cardiac arrest in the setting of NSTEMI and acute CHF, along with acute kidney injury.  Her mother was discharged home with PT/OT and cardiac rehab.  However since discharge her mother has been severely weak, very sleepy (more than normal), with poor p.o. intake.  Earlier this morning around the patient was very altered and confused/ "not acting right",vomiting (nonbloody), and appeared short of breath.   In the ED she was ill appearing but not hypoxic.  She was complaining she could not breathe, and subsequently was intubated for airway protection.   ED Course: Significant Labs:  Urinalysis is concerning for UTI (large leukocytes, greater than 50 WBC) Imaging Chest X-ray>>FINDINGS: Prior median sternotomy/CABG. Heart size within normal limits. Aortic atherosclerosis. No appreciable airspace consolidation or pulmonary edema. No evidence of pleural effusion or pneumothorax. No acute bony abnormality identified. CT head without  contrast>>IMPRESSION: No acute intracranial findings are seen.  Atrophy. CT chest/abdomen/pelvis>>IMPRESSION: Tip of endotracheal tube is at the carina and should be pulled back 2-3 cm. There is interval appearance of small bilateral pleural effusions, more so on the left side. There is thickening of interlobular septi suggesting possible interstitial pulmonary edema. There are patchy densities in the left parahilar region and both lower lung fields which may be due to pulmonary edema or pneumonia. Extensive coronary artery calcifications are seen. There is no evidence of intestinal obstruction or pneumoperitoneum. There is no hydronephrosis. Gallbladder stones. Diverticulosis of colon without signs of diverticulitis. Medications Administered: 3500 units heparin bolus, heparin drip, normal saline bolus   Cardiology has been consulted.  PCCM asked to admit for further work-up and treatment.   Pertinent  Medical History        Past Medical History:  Diagnosis Date   Chest pain 12/06/2021   Diabetes mellitus without complication ([place])     Hyperlipidemia     Hypertension     Myocardial infarction ([place])        Micro Data:  7/20: Urine>> 7/20: Blood culture x2>> 7/20: Tracheal aspirate>>   Antimicrobials:  Ceftriaxone 7/20 x1 dose Zosyn 7/20>>   Significant Hospital Events: Including procedures, antibiotic start and stop dates in addition to other pertinent events   7/20: Presented to ED, required intubation in ED.  Cardiology consulted for NSTEMI.  PCCM asked to admit 7/21 multiorgan failure, pressors, poor prognosis 01/14/22 family was updated yesterday patient is DNR failed weaning trial yesterday Versed drip probably we will try another weaning trial today.     Interim History / Subjective:  +multiorgan failure +shock Remains critically ill Failed SAT/SBT 's last 3 days DNR status and planning for one  way extubation when family at bedside      Objective   Blood  pressure (!) 111/41, pulse 73, temperature 99.1 F (37.3 C), resp. rate 16, height 4' 11"  (1.499 m), weight 56.6 kg, SpO2 99 %.    Vent Mode: PRVC FiO2 (%):  [24 %] 24 % Set Rate:  [16 bmp] 16 bmp Vt Set:  [350 mL] 350 mL PEEP:  [5 cmH20] 5 cmH20 Pressure Support:  [5 cmH20-10 cmH20] 5 cmH20 Plateau Pressure:  [13 cmH20-14 cmH20] 14 cmH20   Intake/Output Summary (Last 24 hours) at 01/15/2022 0731 Last data filed at 01/15/2022 0700 Gross per 24 hour  Intake 1685.94 ml  Output 1205 ml  Net 480.94 ml    Filed Weights   01/13/22 0300 01/14/22 0500 01/15/22 0500  Weight: 55.1 kg 56.2 kg 56.6 kg     REVIEW OF SYSTEMS  PATIENT IS UNABLE TO PROVIDE COMPLETE REVIEW OF SYSTEMS DUE TO SEVERE CRITICAL ILLNESS    PHYSICAL EXAMINATION:  GENERAL:critically ill appearing, +resp distress EYES: Pupils equal, round, reactive to light.  No scleral icterus.  MOUTH: Moist mucosal membrane. INTUBATED NECK: Supple.  PULMONARY: +rhonchi, +wheezing CARDIOVASCULAR: S1 and S2.  No murmurs  GASTROINTESTINAL: Soft, nontender, -distended. Positive bowel sounds.  MUSCULOSKELETAL: No swelling, clubbing, or edema.  NEUROLOGIC: obtunded SKIN:intact,warm,dry     Labs/imaging that I havepersonally reviewed  (right click and "Reselect all SmartList Selections" daily)     ASSESSMENT AND PLAN SYNOPSIS  81 yo white female admitted for severe aspiration pneumonia with septic/cardiogenic shock due to NSTEMI leading to severe hypoxic and hypercapnic respiratory  and renal failure  Severe ACUTE Hypoxic and Hypercapnic Respiratory Failure -continue Mechanical Ventilator support -Wean Fio2 and PEEP as tolerated -VAP/VENT bundle implementation - Wean PEEP & FiO2 as tolerated, maintain SpO2 > 88% - Head of bed elevated 30 degrees, VAP protocol in place - Plateau pressures less than 30 cm H20  - Intermittent chest x-ray & ABG PRN - Ensure adequate pulmonary hygiene  -will perform SAT/SBT when  respiratory parameters are met and when family at bedside  Vent Mode: PRVC FiO2 (%):  [24 %] 24 % Set Rate:  [16 bmp] 16 bmp Vt Set:  [350 mL] 350 mL PEEP:  [5 cmH20] 5 cmH20 Pressure Support:  [5 cmH20-10 cmH20] 5 cmH20 Plateau Pressure:  [13 cmH20-14 cmH20] 14 cmH20   ACUTE  CARDIAC FAILURE- NSTEMI -oxygen as needed -Lasix as tolerated -follow up cardiac enzymes as indicated -follow up cardiology recs   CARDIAC ICU monitoring  ACUTE KIDNEY INJURY/Renal Failure -continue Foley Catheter-assess need -Avoid nephrotoxic agents -Follow urine output, BMP -Ensure adequate renal perfusion, optimize oxygenation -Renal dose medications   Intake/Output Summary (Last 24 hours) at 01/15/2022 0734 Last data filed at 01/15/2022 0700 Gross per 24 hour  Intake 1685.94 ml  Output 1205 ml  Net 480.94 ml      Latest Ref Rng & Units 01/15/2022    4:56 AM 01/14/2022    4:12 AM 01/13/2022    3:39 AM  BMP  Glucose 70 - 99 mg/dL 188  165  198   BUN 8 - 23 mg/dL 37  35  41   Creatinine 0.44 - 1.00 mg/dL 1.31  1.16  1.42   Sodium 135 - 145 mmol/L 138  136  134   Potassium 3.5 - 5.1 mmol/L 3.7  4.3  3.6   Chloride 98 - 111 mmol/L 102  102  103   CO2 22 - 32 mmol/L 28  27  25   Calcium 8.9 - 10.3 mg/dL 7.9  8.3  7.9      NEUROLOGY ACUTE METABOLIC ENCEPHALOPATHY -need for sedation   SHOCK SOURCE-SEPTIC/CARDIOGENIC -use vasopressors to keep MAP>65 as needed -follow ABG and LA as needed -follow up cultures -emperic ABX   INFECTIOUS DISEASE -continue antibiotics as prescribed -follow up cultures   ENDO - ICU hypoglycemic\Hyperglycemia protocol -check FSBS per protocol   GI GI PROPHYLAXIS as indicated  NUTRITIONAL STATUS DIET-->TF's as tolerated Constipation protocol as indicated   ELECTROLYTES -follow labs as needed -replace as needed -pharmacy consultation and following      Best practice (right click and "Reselect all SmartList Selections" daily)  Diet:   NPO Pain/Anxiety/Delirium protocol (if indicated): Yes (RASS goal -2) VAP protocol (if indicated): Yes DVT prophylaxis: Systemic AC GI prophylaxis: PPI Central venous access:  Yes, and it is still needed Foley:  Yes, and it is still needed Mobility:  bed rest  Code Status: DNR status Disposition: ICU  Labs   CBC: Recent Labs  Lab 01/11/22 0719 01/12/22 0415 01/13/22 0339 01/14/22 0412 01/15/22 0456  WBC 5.7 12.0* 9.4 8.3 9.7  NEUTROABS 3.7  --   --   --  7.9*  HGB 8.9* 8.6* 7.7* 7.4* 7.2*  HCT 27.8* 26.7* 24.2* 23.7* 23.1*  MCV 96.2 96.0 97.6 97.5 98.3  PLT 364 411* 312 281 264     Basic Metabolic Panel: Recent Labs  Lab 01/11/22 0713 01/11/22 0719 01/11/22 1645 01/12/22 0415 01/13/22 0339 01/14/22 0412 01/15/22 0456  NA  --  134*  --  135 134* 136 138  K  --  3.0* 3.6 3.1* 3.6 4.3 3.7  CL  --  99  --  100 103 102 102  CO2  --  26  --  28 25 27 28   GLUCOSE  --  169*  --  158* 198* 165* 188*  BUN  --  48*  --  46* 41* 35* 37*  CREATININE  --  1.57*  --  1.75* 1.42* 1.16* 1.31*  CALCIUM  --  8.8*  --  8.4* 7.9* 8.3* 7.9*  MG 2.4  --   --  2.1 2.3 2.4 2.2  PHOS  --   --   --  4.2 3.6 3.6 3.9    GFR: Estimated Creatinine Clearance: 26.3 mL/min (A) (by C-G formula based on SCr of 1.31 mg/dL (H)). Recent Labs  Lab 01/11/22 0840 01/11/22 1049 01/12/22 0415 01/13/22 0339 01/14/22 0412 01/15/22 0456  PROCALCITON 0.46  --  0.32 0.12  --   --   WBC  --   --  12.0* 9.4 8.3 9.7  LATICACIDVEN 1.4 2.3*  --   --   --   --      Liver Function Tests: Recent Labs  Lab 01/11/22 0719 01/15/22 0456  AST 28 16  ALT 33 17  ALKPHOS 66 65  BILITOT 0.7 0.7  PROT 6.5 5.8*  ALBUMIN 3.3* 2.6*    No results for input(s): "LIPASE", "AMYLASE" in the last 168 hours. No results for input(s): "AMMONIA" in the last 168 hours.  ABG    Component Value Date/Time   PHART 7.34 (L) 01/11/2022 0825   PCO2ART 46 01/11/2022 0825   PO2ART 126 (H) 01/11/2022 0825   HCO3 24.8  01/11/2022 0825   ACIDBASEDEF 1.3 01/11/2022 0825   O2SAT 99.9 01/11/2022 0825     Coagulation Profile: Recent Labs  Lab 01/11/22 0719 01/11/22 0840  INR 1.0 1.1  Cardiac Enzymes: No results for input(s): "CKTOTAL", "CKMB", "CKMBINDEX", "TROPONINI" in the last 168 hours.  HbA1C: Hgb A1c MFr Bld  Date/Time Value Ref Range Status  12/06/2021 11:42 AM 6.8 (H) 4.8 - 5.6 % Final    Comment:    (NOTE) Pre diabetes:          5.7%-6.4%  Diabetes:              >6.4%  Glycemic control for   <7.0% adults with diabetes   06/01/2021 09:30 AM 6.7 (H) 4.8 - 5.6 % Final    Comment:    (NOTE)         Prediabetes: 5.7 - 6.4         Diabetes: >6.4         Glycemic control for adults with diabetes: <7.0     CBG: Recent Labs  Lab 01/14/22 1139 01/14/22 1600 01/14/22 1943 01/14/22 2332 01/15/22 0350  GLUCAP 163* 172* 183* 178* 169*     Allergies Allergies  Allergen Reactions   Tetanus Toxoids Swelling    Tetanus and diphtheria toxids   Penicillin G Rash         PATIENT WITH VERY POOR PROGNOSIS I ANTICIPATE PROLONGED ICU LOS    DVT/GI PRX  assessed I Assessed the need for Labs I Assessed the need for Foley I Assessed the need for Central Venous Line Family Discussion when available I Assessed the need for Mobilization I made an Assessment of medications to be adjusted accordingly Safety Risk assessment completed  CASE DISCUSSED IN MULTIDISCIPLINARY ROUNDS WITH ICU TEAM     Critical Care Time devoted to patient care services described in this note is 55 minutes.  Critical care was necessary to treat /prevent imminent and life-threatening deterioration. Overall, patient is critically ill, prognosis is guarded.  Patient with Multiorgan failure and at high risk for cardiac arrest and death.    Corrin Parker, M.D.  [facility]  Medical Director [place] Director [facility] Cardio-Pulmonary Department

## 2022-01-16 LAB — BASIC METABOLIC PANEL
Anion gap: 10 (ref 5–15)
BUN: 48 mg/dL — ABNORMAL HIGH (ref 8–23)
CO2: 27 mmol/L (ref 22–32)
Calcium: 8.3 mg/dL — ABNORMAL LOW (ref 8.9–10.3)
Chloride: 104 mmol/L (ref 98–111)
Creatinine, Ser: 1.36 mg/dL — ABNORMAL HIGH (ref 0.44–1.00)
GFR, Estimated: 39 mL/min — ABNORMAL LOW (ref >60)
Glucose, Bld: 168 mg/dL — ABNORMAL HIGH (ref 70–99)
Potassium: 3.1 mmol/L — ABNORMAL LOW (ref 3.5–5.1)
Sodium: 141 mmol/L (ref 135–145)

## 2022-01-16 LAB — GLUCOSE, CAPILLARY
Glucose-Capillary: 101 mg/dL — ABNORMAL HIGH (ref 70–99)
Glucose-Capillary: 126 mg/dL — ABNORMAL HIGH (ref 70–99)
Glucose-Capillary: 135 mg/dL — ABNORMAL HIGH (ref 70–99)
Glucose-Capillary: 162 mg/dL — ABNORMAL HIGH (ref 70–99)
Glucose-Capillary: 173 mg/dL — ABNORMAL HIGH (ref 70–99)
Glucose-Capillary: 174 mg/dL — ABNORMAL HIGH (ref 70–99)
Glucose-Capillary: 192 mg/dL — ABNORMAL HIGH (ref 70–99)

## 2022-01-16 LAB — TRIGLYCERIDES: Triglycerides: 121 mg/dL (ref <150)

## 2022-01-16 MED ORDER — HEPARIN SODIUM (PORCINE) 5000 UNIT/ML IJ SOLN
5000.0000 [IU] | Freq: Three times a day (TID) | INTRAMUSCULAR | Status: DC
Start: 2022-01-16 — End: 2022-01-18
  Administered 2022-01-16 – 2022-01-18 (×5): 5000 [IU] via SUBCUTANEOUS
  Filled 2022-01-16 (×6): qty 1

## 2022-01-16 MED ORDER — PANTOPRAZOLE SODIUM 40 MG PO TBEC
40.0000 mg | DELAYED_RELEASE_TABLET | Freq: Every day | ORAL | Status: DC
  Administered 2022-01-17 – 2022-01-23 (×7): 40 mg via ORAL
  Filled 2022-01-16 (×7): qty 1

## 2022-01-16 MED ORDER — DOCUSATE SODIUM 100 MG PO CAPS: 100.0000 mg | ORAL_CAPSULE | Freq: Two times a day (BID) | ORAL | Status: DC | PRN

## 2022-01-16 MED ORDER — CLOPIDOGREL BISULFATE 75 MG PO TABS
75.0000 mg | ORAL_TABLET | Freq: Every day | ORAL | Status: DC
Start: 2022-01-17 — End: 2022-01-23
  Administered 2022-01-17 – 2022-01-23 (×7): 75 mg via ORAL
  Filled 2022-01-16 (×7): qty 1

## 2022-01-16 MED ORDER — ASPIRIN 81 MG PO TBEC
81.0000 mg | DELAYED_RELEASE_TABLET | Freq: Every day | ORAL | Status: DC
  Administered 2022-01-17 – 2022-01-23 (×7): 81 mg via ORAL
  Filled 2022-01-16 (×7): qty 1

## 2022-01-16 MED ORDER — MORPHINE SULFATE (PF) 2 MG/ML IV SOLN
1.0000 mg | INTRAVENOUS | Status: DC | PRN
  Filled 2022-01-16: qty 1

## 2022-01-16 MED ORDER — POLYETHYLENE GLYCOL 3350 17 G PO PACK: 17.0000 g | PACK | Freq: Every day | ORAL | Status: DC | PRN

## 2022-01-16 MED ORDER — FUROSEMIDE 10 MG/ML IJ SOLN
40.0000 mg | Freq: Once | INTRAMUSCULAR | Status: AC
  Administered 2022-01-16: 40 mg via INTRAVENOUS
  Filled 2022-01-16: qty 4

## 2022-01-16 MED ORDER — POTASSIUM CHLORIDE 20 MEQ PO PACK
40.0000 meq | PACK | Freq: Once | ORAL | Status: AC
Start: 2022-01-16 — End: 2022-01-16
  Administered 2022-01-16: 40 meq
  Filled 2022-01-16: qty 2

## 2022-01-16 MED ORDER — MORPHINE SULFATE (PF) 2 MG/ML IV SOLN
1.0000 mg | INTRAVENOUS | Status: DC | PRN
  Administered 2022-01-16 (×2): 1 mg via INTRAVENOUS
  Administered 2022-01-20 – 2022-01-23 (×6): 2 mg via INTRAVENOUS
  Filled 2022-01-16 (×8): qty 1

## 2022-01-16 NOTE — Progress Notes (Signed)
NAME:  Lindsey Pollard, MRN:  [id number], DOB:  [birth year], LOS: 5 ADMISSION DATE:  01/11/2022  Brief Pt Description / Synopsis:  81 year old female admitted with acute metabolic encephalopathy and acute respiratory failure in the setting of acute on chronic HFpEF, elevated troponins (NSTEMI versus demand ischemia), UTI and presumed aspiration.  intubation for airway protection and multiorgan failure   History of Present Illness:  81 year old female with a past medical history significant for recent V-fib arrest, HFpEF, ischemic cardiomyopathy, CAD status post CABG x2 in 2018 and PCI, chronic kidney disease stage III, diabetes mellitus who presents to [facility] ED on 01/11/2022 due to altered mental status, respiratory distress, and nausea/vomiting.  Patient is currently intubated and sedated, therefore history is obtained from patient's daughter at bedside along with chart review.   Per the patient's daughter, the patient was recently admitted from 7/12 through 7/18 for brief cardiac arrest in the setting of NSTEMI and acute CHF, along with acute kidney injury.  Her mother was discharged home with PT/OT and cardiac rehab.  However since discharge her mother has been severely weak, very sleepy (more than normal), with poor p.o. intake.  Earlier this morning around the patient was very altered and confused/ "not acting right",vomiting (nonbloody), and appeared short of breath.   In the ED she was ill appearing but not hypoxic.  She was complaining she could not breathe, and subsequently was intubated for airway protection.   ED Course: Significant Labs:  Urinalysis is concerning for UTI (large leukocytes, greater than 50 WBC) Imaging Chest X-ray>>FINDINGS: Prior median sternotomy/CABG. Heart size within normal limits. Aortic atherosclerosis. No appreciable airspace consolidation or pulmonary edema. No evidence of pleural effusion or pneumothorax. No acute bony abnormality identified. CT head without  contrast>>IMPRESSION: No acute intracranial findings are seen.  Atrophy. CT chest/abdomen/pelvis>>IMPRESSION: Tip of endotracheal tube is at the carina and should be pulled back 2-3 cm. There is interval appearance of small bilateral pleural effusions, more so on the left side. There is thickening of interlobular septi suggesting possible interstitial pulmonary edema. There are patchy densities in the left parahilar region and both lower lung fields which may be due to pulmonary edema or pneumonia. Extensive coronary artery calcifications are seen. There is no evidence of intestinal obstruction or pneumoperitoneum. There is no hydronephrosis. Gallbladder stones. Diverticulosis of colon without signs of diverticulitis. Medications Administered: 3500 units heparin bolus, heparin drip, normal saline bolus   Cardiology has been consulted.  PCCM asked to admit for further work-up and treatment.   Pertinent  Medical History        Past Medical History:  Diagnosis Date   Chest pain 12/06/2021   Diabetes mellitus without complication ([place])     Hyperlipidemia     Hypertension     Myocardial infarction ([place])        Micro Data:  7/20: Urine>> 7/20: Blood culture x2>> 7/20: Tracheal aspirate>>   Antimicrobials:  Ceftriaxone 7/20 x1 dose Zosyn 7/20>>   Significant Hospital Events: Including procedures, antibiotic start and stop dates in addition to other pertinent events   7/20: Presented to ED, required intubation in ED.  Cardiology consulted for NSTEMI.  PCCM asked to admit 7/21 multiorgan failure, pressors, poor prognosis 01/14/22 family was updated yesterday patient is DNR failed weaning trial yesterday Versed drip probably we will try another weaning trial today. 7/24 failed SAT/SBT due to ISCHEMIC CARDIOMYOPATHY     Interim History / Subjective:  +MULTIORGAN FAILURE REMAINS CRITICALLY ILL FAILING SAT/SBT's PLAN FOR ONE WAY  EXTUBATION TODAY     Objective   Blood pressure  (!) 123/49, pulse 82, temperature 99 F (37.2 C), resp. rate 14, height 4' 11.02" (1.499 m), weight 60.1 kg, SpO2 100 %.    Vent Mode: PRVC FiO2 (%):  [24 %-30 %] 30 % Set Rate:  [16 bmp] 16 bmp Vt Set:  [350 mL] 350 mL PEEP:  [5 cmH20] 5 cmH20 Pressure Support:  [5 cmH20] 5 cmH20 Plateau Pressure:  [14 cmH20-17 cmH20] 14 cmH20   Intake/Output Summary (Last 24 hours) at 01/16/2022 0731 Last data filed at 01/16/2022 0615 Gross per 24 hour  Intake 1002.91 ml  Output 600 ml  Net 402.91 ml    Filed Weights   01/14/22 0500 01/15/22 0500 01/16/22 0451  Weight: 56.2 kg 56.6 kg 60.1 kg    REVIEW OF SYSTEMS  PATIENT IS UNABLE TO PROVIDE COMPLETE REVIEW OF SYSTEMS DUE TO SEVERE CRITICAL ILLNESS    PHYSICAL EXAMINATION:  GENERAL:critically ill appearing, +resp distress EYES: Pupils equal, round, reactive to light.  No scleral icterus.  MOUTH: Moist mucosal membrane. INTUBATED NECK: Supple.  PULMONARY: +rhonchi, +wheezing CARDIOVASCULAR: S1 and S2.  No murmurs  GASTROINTESTINAL: Soft, nontender, -distended. Positive bowel sounds.  MUSCULOSKELETAL: No swelling, clubbing, or edema.  NEUROLOGIC: obtunded SKIN:intact,warm,dry      Labs/imaging that I havepersonally reviewed  (right click and "Reselect all SmartList Selections" daily)     ASSESSMENT AND PLAN SYNOPSIS  81 yo white female admitted for severe aspiration pneumonia with septic/cardiogenic shock due to NSTEMI leading to severe hypoxic and hypercapnic respiratory  and renal failure  Severe ACUTE Hypoxic and Hypercapnic Respiratory Failure -continue Mechanical Ventilator support Failing weaning trials active ISCHEMIC CARDIOMYOPATHY  -will perform SAT/SBT when respiratory parameters are met and when family at bedside ONE WAY EXTUBATION POSSIBLY TODAY  Vent Mode: PRVC FiO2 (%):  [24 %-30 %] 30 % Set Rate:  [16 bmp] 16 bmp Vt Set:  [350 mL] 350 mL PEEP:  [5 cmH20] 5 cmH20 Pressure Support:  [5 cmH20] 5  cmH20 Plateau Pressure:  [14 cmH20-17 cmH20] 14 cmH20   ACUTE  CARDIAC FAILURE- NSTEMI -oxygen as needed -Lasix as tolerated -follow up cardiac enzymes as indicated -follow up cardiology recs-NO FURTHER INTERVENTIONS AT [place] ICU monitoring  ACUTE KIDNEY INJURY/Renal Failure -continue Foley Catheter-assess need -Avoid nephrotoxic agents -Follow urine output, BMP -Ensure adequate renal perfusion, optimize oxygenation -Renal dose medications   Intake/Output Summary (Last 24 hours) at 01/16/2022 0734 Last data filed at 01/16/2022 0615 Gross per 24 hour  Intake 1002.91 ml  Output 600 ml  Net 402.91 ml      Latest Ref Rng & Units 01/16/2022    4:43 AM 01/15/2022    4:56 AM 01/14/2022    4:12 AM  BMP  Glucose 70 - 99 mg/dL 168  188  165   BUN 8 - 23 mg/dL 48  37  35   Creatinine 0.44 - 1.00 mg/dL 1.36  1.31  1.16   Sodium 135 - 145 mmol/L 141  138  136   Potassium 3.5 - 5.1 mmol/L 3.1  3.7  4.3   Chloride 98 - 111 mmol/L 104  102  102   CO2 22 - 32 mmol/L 27  28  27    Calcium 8.9 - 10.3 mg/dL 8.3  7.9  8.3      NEUROLOGY ACUTE METABOLIC ENCEPHALOPATHY -need for sedation   INFECTIOUS DISEASE -continue antibiotics as prescribed -follow up cultures   ENDO - ICU  hypoglycemic\Hyperglycemia protocol -check FSBS per protocol   GI GI PROPHYLAXIS as indicated  NUTRITIONAL STATUS DIET-->TF's as tolerated Constipation protocol as indicated   ELECTROLYTES -follow labs as needed -replace as needed -pharmacy consultation and following     Best practice (right click and "Reselect all SmartList Selections" daily)  Diet:  NPO Pain/Anxiety/Delirium protocol (if indicated): Yes (RASS goal -2) VAP protocol (if indicated): Yes DVT prophylaxis: Systemic AC GI prophylaxis: PPI Central venous access:  Yes, and it is still needed Foley:  Yes, and it is still needed Mobility:  bed rest  Code Status: DNR status Disposition: ICU  Labs   CBC: Recent  Labs  Lab 01/11/22 0719 01/12/22 0415 01/13/22 0339 01/14/22 0412 01/15/22 0456  WBC 5.7 12.0* 9.4 8.3 9.7  NEUTROABS 3.7  --   --   --  7.9*  HGB 8.9* 8.6* 7.7* 7.4* 7.2*  HCT 27.8* 26.7* 24.2* 23.7* 23.1*  MCV 96.2 96.0 97.6 97.5 98.3  PLT 364 411* 312 281 264     Basic Metabolic Panel: Recent Labs  Lab 01/11/22 0713 01/11/22 0719 01/12/22 0415 01/13/22 0339 01/14/22 0412 01/15/22 0456 01/16/22 0443  NA  --    < > 135 134* 136 138 141  K  --    < > 3.1* 3.6 4.3 3.7 3.1*  CL  --    < > 100 103 102 102 104  CO2  --    < > 28 25 27 28 27   GLUCOSE  --    < > 158* 198* 165* 188* 168*  BUN  --    < > 46* 41* 35* 37* 48*  CREATININE  --    < > 1.75* 1.42* 1.16* 1.31* 1.36*  CALCIUM  --    < > 8.4* 7.9* 8.3* 7.9* 8.3*  MG 2.4  --  2.1 2.3 2.4 2.2  --   PHOS  --   --  4.2 3.6 3.6 3.9  --    < > = values in this interval not displayed.    GFR: Estimated Creatinine Clearance: 26 mL/min (A) (by C-G formula based on SCr of 1.36 mg/dL (H)). Recent Labs  Lab 01/11/22 0840 01/11/22 1049 01/12/22 0415 01/13/22 0339 01/14/22 0412 01/15/22 0456  PROCALCITON 0.46  --  0.32 0.12  --   --   WBC  --   --  12.0* 9.4 8.3 9.7  LATICACIDVEN 1.4 2.3*  --   --   --   --      Liver Function Tests: Recent Labs  Lab 01/11/22 0719 01/15/22 0456  AST 28 16  ALT 33 17  ALKPHOS 66 65  BILITOT 0.7 0.7  PROT 6.5 5.8*  ALBUMIN 3.3* 2.6*    No results for input(s): "LIPASE", "AMYLASE" in the last 168 hours. No results for input(s): "AMMONIA" in the last 168 hours.  ABG    Component Value Date/Time   PHART 7.34 (L) 01/11/2022 0825   PCO2ART 46 01/11/2022 0825   PO2ART 126 (H) 01/11/2022 0825   HCO3 24.8 01/11/2022 0825   ACIDBASEDEF 1.3 01/11/2022 0825   O2SAT 99.9 01/11/2022 0825     Coagulation Profile: Recent Labs  Lab 01/11/22 0719 01/11/22 0840  INR 1.0 1.1     Cardiac Enzymes: No results for input(s): "CKTOTAL", "CKMB", "CKMBINDEX", "TROPONINI" in the last  168 hours.  HbA1C: Hgb A1c MFr Bld  Date/Time Value Ref Range Status  12/06/2021 11:42 AM 6.8 (H) 4.8 - 5.6 % Final    Comment:    (  NOTE) Pre diabetes:          5.7%-6.4%  Diabetes:              >6.4%  Glycemic control for   <7.0% adults with diabetes   06/01/2021 09:30 AM 6.7 (H) 4.8 - 5.6 % Final    Comment:    (NOTE)         Prediabetes: 5.7 - 6.4         Diabetes: >6.4         Glycemic control for adults with diabetes: <7.0     CBG: Recent Labs  Lab 01/15/22 1556 01/15/22 1915 01/15/22 2249 01/15/22 2336 01/16/22 0444  GLUCAP 155* 154* 132* 127* 162*     Allergies Allergies  Allergen Reactions   Tetanus Toxoids Swelling    Tetanus and diphtheria toxids   Penicillin G Rash        DVT/GI PRX  assessed I Assessed the need for Labs I Assessed the need for Foley I Assessed the need for Central Venous Line Family Discussion when available I Assessed the need for Mobilization I made an Assessment of medications to be adjusted accordingly Safety Risk assessment completed  CASE DISCUSSED IN MULTIDISCIPLINARY ROUNDS WITH ICU TEAM     Critical Care Time devoted to patient care services described in this note is 45 minutes.  Critical care was necessary to treat /prevent imminent and life-threatening deterioration. Overall, patient is critically ill, prognosis is guarded.  Patient with Multiorgan failure and at high risk for cardiac arrest and death.    Corrin Parker, M.D.  [facility]  Medical Director [place] Director [facility] Cardio-Pulmonary Department

## 2022-01-16 NOTE — Progress Notes (Signed)
[facility] CLINIC CARDIOLOGY CONSULT NOTE       Patient ID: Lindsey Pollard MRN: [id number] DOB/AGE: 1940/07/26 81 y.o.  Admit date: 01/11/2022 Referring Physician Dr. Willy Eddy Primary Physician Dr. Einar Crow  Primary Cardiologist Dr. Juliann Pares Reason for Consultation NSTEMI  HPI: Lindsey Pollard is an 85yoF with a PMH of CAD s/p CABG x2 in 2018 (patent LIMA to LAD,  occluded SVG to OM1 by [facility] 05/2021), h/o PCI x1 distal RCA (50% ISR by [facility] 05/2021), ischemic CM with LVEF with moderate hypo of LV mid apical anterolateral wall, 55-60%, g1dd, mod MR 12/2021, CKD 3, hyperlipidemia, type 2 diabetes, CAD, GERD who was recently hospitalized at [facility] from 7/12 - 7/18 initially for shortness of breath and developed chest pain, VF arrest with successful resuscitation and her NSTEMI was treated medically.  She presents to [facility] ED 2 days after discharge with acute encephalopathy, nausea, vomiting, and was intubated for airway protection in the ED. cardiology was consulted due to her significant cardiac history and concern for NSTEMI.  Interval History:  -Remains intubated but able to shake her head "no" when asked if she has any chest pain -three family members at bedside, planning one way extubation this morning   Past Medical History:  Diagnosis Date   Chest pain 12/06/2021   Diabetes mellitus without complication (HCC)    Hyperlipidemia    Hypertension    Myocardial infarction [facility])     Past Surgical History:  Procedure Laterality Date   CARDIAC SURGERY     bypass   CORONARY/GRAFT ACUTE MI REVASCULARIZATION N/A 06/01/2021   Procedure: Coronary/Graft Acute MI Revascularization;  Surgeon: Alwyn Pea, MD;  Location: [facility] INVASIVE CV LAB;  Service: Cardiovascular;  Laterality: N/A;   EYE SURGERY     cataract extraction   LEFT HEART CATH AND CORONARY ANGIOGRAPHY N/A 06/01/2021   Procedure: LEFT HEART CATH AND CORONARY ANGIOGRAPHY;  Surgeon: Alwyn Pea, MD;  Location: [facility]  INVASIVE CV LAB;  Service: Cardiovascular;  Laterality: N/A;    Medications Prior to Admission  Medication Sig Dispense Refill Last Dose   acetaminophen (TYLENOL) 325 MG tablet Take 650 mg by mouth at bedtime.   PRN at PRN   albuterol (VENTOLIN HFA) 108 (90 Base) MCG/ACT inhaler Inhale 1-2 puffs into the lungs every 4 (four) hours as needed for shortness of breath.      aspirin 81 MG EC tablet Take by mouth.      atorvastatin (LIPITOR) 80 MG tablet Take 80 mg by mouth every evening.      baclofen (LIORESAL) 10 MG tablet Take 10 mg by mouth 3 (three) times daily.      Cholecalciferol 25 MCG (1000 UT) capsule Take 1,000 Units by mouth 2 (two) times daily.      clopidogrel (PLAVIX) 75 MG tablet Take 1 tablet (75 mg total) by mouth daily. 30 tablet 0    feeding supplement (ENSURE ENLIVE / ENSURE PLUS) LIQD Take 237 mLs by mouth 3 (three) times daily between meals. 237 mL 12    insulin glargine (LANTUS) 100 UNIT/ML injection Inject 8 Units into the skin at bedtime.      insulin lispro (HUMALOG) 100 UNIT/ML KwikPen Inject 0-2 Units into the skin 3 (three) times daily.      ipratropium-albuterol (DUONEB) 0.5-2.5 (3) MG/3ML SOLN Take 3 mLs by nebulization every 6 (six) hours as needed (shortness of breath).      isosorbide mononitrate (IMDUR) 30 MG 24 hr tablet Take 1 tablet (30 mg  total) by mouth daily. 30 tablet 1    lisinopril (ZESTRIL) 20 MG tablet Take 1 tablet (20 mg total) by mouth daily. 30 tablet 1    lisinopril-hydrochlorothiazide (ZESTORETIC) 10-12.5 MG tablet Take 1 tablet by mouth daily.      metoprolol succinate (TOPROL-XL) 50 MG 24 hr tablet Take 50 mg by mouth daily.      Multiple Vitamin (MULTIVITAMIN WITH MINERALS) TABS tablet Take 1 tablet by mouth daily.      nitroGLYCERIN (NITROSTAT) 0.4 MG SL tablet Place 0.4 mg under the tongue every 5 (five) minutes as needed for chest pain.      ondansetron (ZOFRAN-ODT) 4 MG disintegrating tablet Take 4 mg by mouth every 8 (eight) hours as  needed.      senna (SENOKOT) 8.6 MG tablet Take 2 tablets by mouth at bedtime.      SPIRIVA HANDIHALER 18 MCG inhalation capsule Place 1 capsule into inhaler and inhale daily as needed for shortness of breath.      spironolactone (ALDACTONE) 25 MG tablet Take 12.5 mg by mouth daily.      torsemide (DEMADEX) 20 MG tablet Take 20 mg by mouth daily.      Social History   Socioeconomic History   Marital status: Divorced    Spouse name: Not on file   Number of children: Not on file   Years of education: Not on file   Highest education level: Not on file  Occupational History   Not on file  Tobacco Use   Smoking status: Former    Types: Cigarettes   Smokeless tobacco: Never  Substance and Sexual Activity   Alcohol use: Not Currently   Drug use: Not Currently   Sexual activity: Not on file  Other Topics Concern   Not on file  Social History Narrative   Not on file   Social Determinants of Health   Financial Resource Strain: Not on file  Food Insecurity: Not on file  Transportation Needs: Not on file  Physical Activity: Not on file  Stress: Not on file  Social Connections: Not on file  Intimate Partner Violence: Not on file    Family History  Problem Relation Age of Onset   Congestive Heart Failure Mother    Diabetes Father    Breast cancer Neg Hx      PHYSICAL EXAM General: Elderly and chronically ill-appearing Caucasian female intubated lying in ICU bed with daughter at bedside  HEENT:  Normocephalic and atraumatic. Neck:  No JVD.  Lungs: Mechanically ventilated breath sounds, coarse to auscultation anteriorly Heart: HRRR . Normal S1 and S2 with 3/6 systolic murmur heard throughout.  Radial pulses nonpalpable Abdomen: Non-distended appearing.  Msk: Normal strength and tone for age. Extremities: Warm and well perfused. No clubbing, cyanosis.  No peripheral edema.  Neuro: answers simple questions with nods and shakes of her head Psych: unable to assess  Labs:   Lab  Results  Component Value Date   WBC 9.7 01/15/2022   HGB 7.2 (L) 01/15/2022   HCT 23.1 (L) 01/15/2022   MCV 98.3 01/15/2022   PLT 264 01/15/2022    Recent Labs  Lab 01/15/22 0456 01/16/22 0443  NA 138 141  K 3.7 3.1*  CL 102 104  CO2 28 27  BUN 37* 48*  CREATININE 1.31* 1.36*  CALCIUM 7.9* 8.3*  PROT 5.8*  --   BILITOT 0.7  --   ALKPHOS 65  --   ALT 17  --   AST 16  --  GLUCOSE 188* 168*    No results found for: "CKTOTAL", "CKMB", "CKMBINDEX", "TROPONINI"  Lab Results  Component Value Date   CHOL 102 12/07/2021   CHOL 127 06/01/2021   Lab Results  Component Value Date   HDL 31 (L) 12/07/2021   HDL 47 06/01/2021   Lab Results  Component Value Date   LDLCALC 45 12/07/2021   LDLCALC 49 06/01/2021   Lab Results  Component Value Date   TRIG 121 01/16/2022   TRIG 101 01/04/2022   TRIG 128 12/07/2021   Lab Results  Component Value Date   CHOLHDL 3.3 12/07/2021   CHOLHDL 2.7 06/01/2021   No results found for: "LDLDIRECT"    Radiology: [facility] Chest Port 1 View  Result Date: 01/15/2022 CLINICAL DATA:  Respiratory failure EXAM: PORTABLE CHEST 1 VIEW COMPARISON:  Prior chest x-ray 11/07/2041 FINDINGS: Endotracheal tube present. The tip is 4.7 cm above the carina. Left IJ approach central venous catheter with the catheter tip at the cavoatrial junction. A gastric tube is present. The proximal side-hole is visualized over the gastric bubble. Patient is status post median sternotomy with evidence of multivessel CABG. Sternal fixation hardware in place. Cardiac and mediastinal contours are within normal limits. Increased pulmonary vascular congestion now with mild interstitial edema. Probable small bilateral pleural effusions with associated bibasilar atelectasis. No pneumothorax. No acute osseous abnormality. IMPRESSION: 1. Stable and satisfactory support apparatus. 2. Increased pulmonary vascular congestion now with mild interstitial edema. 3. Small bilateral layering pleural  effusions. Electronically Signed   By: Jacqulynn Cadet M.D.   On: 01/15/2022 10:15   ECHOCARDIOGRAM LIMITED  Result Date: 01/12/2022    ECHOCARDIOGRAM LIMITED REPORT   Patient Name:   Lindsey Pollard [facility] Date of Exam: 01/12/2022 Medical Rec #:  [id number]     Height:       59.0 in Accession #:    [id number]    Weight:       140.4 lb Date of Birth:  [birth year]     BSA:          1.587 m Patient Age:    26 years      BP:           146/47 mmHg Patient Gender: F             HR:           51 bpm. Exam Location:  [facility] Procedure: Limited Echo, Cardiac Doppler, Color Doppler and Intracardiac            Opacification Agent Indications:     Elevated Troponin  History:         Patient has prior history of Echocardiogram examinations, most                  recent 01/04/2022.  Sonographer:     Sherrie Sport Referring Phys:  [id number] Benjamin Thaddius Manes Diagnosing Phys: Yolonda Kida MD  Sonographer Comments: Suboptimal parasternal window and suboptimal apical window. IMPRESSIONS  1. Left ventricular ejection fraction, by estimation, is 45 to 50%. The left ventricle has mildly decreased function. The left ventricle demonstrates global hypokinesis. The left ventricular internal cavity size was mildly dilated. Left ventricular diastolic function could not be evaluated.  2. Right ventricular systolic function is normal.  3. Moderate pleural effusion in the left lateral region.  4. The mitral valve is normal in structure.  5. The aortic valve is normal in structure. Aortic valve regurgitation is not visualized. Conclusion(s)/Recommendation(s): Poor windows for evaluation of  left ventricular function by transthoracic echocardiography. Would recommend an alternative means of evaluation. FINDINGS  Left Ventricle: Left ventricular ejection fraction, by estimation, is 45 to 50%. The left ventricle has mildly decreased function. The left ventricle demonstrates global hypokinesis. Definity contrast agent was given IV to delineate the left  ventricular  endocardial borders. The left ventricular internal cavity size was mildly dilated. There is no left ventricular hypertrophy. Left ventricular diastolic function could not be evaluated. Right Ventricle: No increase in right ventricular wall thickness. Right ventricular systolic function is normal. Left Atrium: Left atrial size was normal in size. Right Atrium: Right atrial size was normal in size. Mitral Valve: The mitral valve is normal in structure. Tricuspid Valve: The tricuspid valve is normal in structure. Aortic Valve: The aortic valve is normal in structure. Aortic valve regurgitation is not visualized. Pulmonic Valve: The pulmonic valve was normal in structure. Aorta: The ascending aorta was not well visualized. IAS/Shunts: No atrial level shunt detected by color flow Doppler. Additional Comments: There is a moderate pleural effusion in the left lateral region. LEFT VENTRICLE PLAX 2D LVIDd:         4.70 cm LVIDs:         3.40 cm LV PW:         1.10 cm LV IVS:        0.90 cm LVOT diam:     2.00 cm LVOT Area:     3.14 cm  LEFT ATRIUM         Index LA diam:    3.00 cm 1.89 cm/m   AORTA Ao Root diam: 2.90 cm TRICUSPID VALVE TR Peak grad:   28.9 mmHg TR Vmax:        269.00 cm/s  SHUNTS Systemic Diam: 2.00 cm Yolonda Kida MD Electronically signed by Yolonda Kida MD Signature Date/Time: 01/12/2022/2:52:11 PM    Final    DG Chest Port 1 View  Result Date: 01/11/2022 CLINICAL DATA:  Central line placement. EXAM: PORTABLE CHEST 1 VIEW COMPARISON:  Radiograph and CT earlier today. FINDINGS: New left internal jugular central venous catheter tip overlies the atrial caval junction. No pneumothorax. Stable endotracheal and enteric tubes. Stable heart size and mediastinal contours. Small bilateral pleural effusions, increasing on the left. No pulmonary edema or new airspace disease. IMPRESSION: 1. New left internal jugular central venous catheter with tip over the atrial caval junction. No  pneumothorax. 2. Stable endotracheal and enteric tubes. 3. Small bilateral pleural effusions, increasing on the left. Electronically Signed   By: Keith Rake M.D.   On: 01/11/2022 18:41   CT HEAD WO CONTRAST (5MM)  Result Date: 01/11/2022 CLINICAL DATA:  Altered mental status EXAM: CT HEAD WITHOUT CONTRAST TECHNIQUE: Contiguous axial images were obtained from the base of the skull through the vertex without intravenous contrast. RADIATION DOSE REDUCTION: This exam was performed according to the departmental dose-optimization program which includes automated exposure control, adjustment of the mA and/or kV according to patient size and/or use of iterative reconstruction technique. COMPARISON:  01/03/2022 FINDINGS: Brain: No acute intracranial findings are seen in noncontrast CT brain. There are no signs of bleeding within the cranium. Cortical sulci are prominent. Vascular: Unremarkable. Skull: Unremarkable Sinuses/Orbits: Unremarkable. Other: None. IMPRESSION: No acute intracranial findings are seen.  Atrophy. Electronically Signed   By: Elmer Picker M.D.   On: 01/11/2022 08:28   CT CHEST ABDOMEN PELVIS WO CONTRAST  Result Date: 01/11/2022 CLINICAL DATA:  Shortness of breath, altered mental status EXAM: CT CHEST,  ABDOMEN AND PELVIS WITHOUT CONTRAST TECHNIQUE: Multidetector CT imaging of the chest, abdomen and pelvis was performed following the standard protocol without IV contrast. RADIATION DOSE REDUCTION: This exam was performed according to the departmental dose-optimization program which includes automated exposure control, adjustment of the mA and/or kV according to patient size and/or use of iterative reconstruction technique. COMPARISON:  01/03/2022 FINDINGS: CT CHEST FINDINGS Cardiovascular: Extensive coronary artery calcifications are seen. There is evidence of previous coronary bypass surgery. Scattered calcifications are seen in thoracic aorta and its major branches. Mediastinum/Nodes:  Slightly enlarged lymph nodes are seen in mediastinum with no significant interval change. Tip of endotracheal tube is at the carina and should be pulled back 2-3 cm. Lungs/Pleura: Small patchy infiltrates are seen in left parahilar region and posterior lower lung fields. There is interval appearance of small bilateral pleural effusions. There is no pneumothorax. There is slight thickening of interlobular septi. Musculoskeletal: No acute findings are seen. CT ABDOMEN PELVIS FINDINGS Hepatobiliary: No focal abnormalities are seen in liver. There is no dilation of bile ducts. Gallbladder is distended, possibly due to fasting state. There is no wall thickening in gallbladder. Gallbladder stones are seen. Pancreas: No focal abnormalities are seen. Diverticulum is noted along the inner margin of second portion of duodenum. Spleen: Unremarkable. Adrenals/Urinary Tract: Adrenals are unremarkable. Left kidney is much smaller than right. This may be a congenital variation or suggest renal artery stenosis or chronic pyelonephritis. There is no hydronephrosis. There are no renal or ureteral stones. Foley catheter is seen in the bladder. Stomach/Bowel: Tip of enteric tube is seen in the stomach. There is no dilation of small bowel loops. Appendix is unremarkable. There is no significant wall thickening in colon. Scattered diverticula are seen in the colon without signs of focal acute diverticulitis. Vascular/Lymphatic: Extensive arterial calcifications are seen. Reproductive: Coarse calcifications seen in pelvis may suggest calcified uterine fibroids. Other: There is no ascites or pneumoperitoneum. Musculoskeletal: Degenerative changes are noted in lumbar spine, particularly severe at L2-L3 and L3-L4 levels. This finding has not changed significantly. IMPRESSION: Tip of endotracheal tube is at the carina and should be pulled back 2-3 cm. There is interval appearance of small bilateral pleural effusions, more so on the left  side. There is thickening of interlobular septi suggesting possible interstitial pulmonary edema. There are patchy densities in the left parahilar region and both lower lung fields which may be due to pulmonary edema or pneumonia. Extensive coronary artery calcifications are seen. There is no evidence of intestinal obstruction or pneumoperitoneum. There is no hydronephrosis. Gallbladder stones. Diverticulosis of colon without signs of diverticulitis. Other findings as described in the body of the report. Electronically Signed   By: Elmer Picker M.D.   On: 01/11/2022 08:27   DG Abd Portable 1 View  Result Date: 01/11/2022 CLINICAL DATA:  OG tube placement. EXAM: PORTABLE ABDOMEN - 1 VIEW COMPARISON:  AP chest 01/11/2022 at 704 hours FINDINGS: New orogastric tube with side port overlying the proximal stomach and tip overlying the mid aspect of the greater curvature. Nonobstructed upper abdominal bowel-gas pattern. No portal venous gas or pneumatosis. Severe left L3-4 disc space narrowing and moderate to severe diffuse L4-5 disc space narrowing. Mild dextrocurvature centered at L3-4. IMPRESSION: Orogastric tube in appropriate position. Electronically Signed   By: Yvonne Kendall M.D.   On: 01/11/2022 08:11   DG Chest Portable 1 View  Result Date: 01/11/2022 CLINICAL DATA:  Tube placement.  Encounter for intubation. EXAM: PORTABLE CHEST 1 VIEW COMPARISON:  AP  chest 01/11/2022 at 704 hours FINDINGS: AP chest 01/11/2022 at 735 hours. Interval placement of endotracheal tube with tip terminating imaging of the thoracic inlet and the carina proximally 3.2 cm above the carina. Enteric tube descends below the diaphragm with the side port overlying the proximal stomach and the tip excluded by inferior collimation, new from prior. Postsurgical changes are again seen of median sternotomy and CABG. Cardiac silhouette and mediastinal contours are within normal limits. Moderate calcification within the aortic arch. Mild  left basilar linear likely subsegmental atelectasis. No definite pleural effusion. No pneumothorax is seen. No acute skeletal abnormality. IMPRESSION: 1. New endotracheal tube and enteric tube in appropriate position. 2. No acute lung process. 3.  Aortic Atherosclerosis (ICD10-I70.0). Electronically Signed   By: Yvonne Kendall M.D.   On: 01/11/2022 08:10   DG Chest Portable 1 View  Result Date: 01/11/2022 CLINICAL DATA:  Provided history: New onset dyspnea, recent cardiac event. EXAM: PORTABLE CHEST 1 VIEW COMPARISON:  Prior chest radiographs 01/04/2022 and earlier. FINDINGS: Prior median sternotomy/CABG. Heart size within normal limits. Aortic atherosclerosis. No appreciable airspace consolidation or pulmonary edema. No evidence of pleural effusion or pneumothorax. No acute bony abnormality identified. IMPRESSION: No evidence of acute cardiopulmonary abnormality. Aortic Atherosclerosis (ICD10-I70.0). Electronically Signed   By: Kellie Simmering D.O.   On: 01/11/2022 08:03   ECHOCARDIOGRAM LIMITED  Result Date: 01/04/2022    ECHOCARDIOGRAM LIMITED REPORT   Patient Name:   Lindsey Pollard [facility] Date of Exam: 01/04/2022 Medical Rec #:  [id number]     Height:       59.0 in Accession #:    [id number]    Weight:       127.4 lb Date of Birth:  06/09/1941     BSA:          1.523 m Patient Age:    42 years      BP:           128/50 mmHg Patient Gender: F             HR:           54 bpm. Exam Location:  [facility] Procedure: Limited Echo, Color Doppler and Cardiac Doppler Indications:     Cardiac Areest I46.9  History:         Patient has prior history of Echocardiogram examinations, most                  recent 12/07/2021. Previous Myocardial Infarction,                  Signs/Symptoms:Chest Pain; Risk Factors:Diabetes and                  Hypertension.  Sonographer:     Sherrie Sport Referring Phys:  [id number] Kalona Nabila Albarracin Diagnosing Phys: Serafina Royals MD  Sonographer Comments: Image acquisition challenging due to patient behavioral  factors. IMPRESSIONS  1. The left ventricle demonstrates regional wall motion abnormalities (see scoring diagram/findings for description).  2. Right ventricular systolic function is normal. The right ventricular size is normal.  3. Left atrial size was moderately dilated.  4. The mitral valve is normal in structure. Moderate mitral valve regurgitation.  5. Tricuspid valve regurgitation is mild to moderate.  6. The aortic valve is normal in structure. Aortic valve regurgitation is mild. FINDINGS  Left Ventricle: The left ventricle demonstrates regional wall motion abnormalities. Moderate hypokinesis of the left ventricular, mid-apical anterolateral wall. Right Ventricle: The right ventricular size is normal. No  increase in right ventricular wall thickness. Right ventricular systolic function is normal. Left Atrium: Left atrial size was moderately dilated. Right Atrium: Right atrial size was normal in size. Pericardium: There is no evidence of pericardial effusion. Mitral Valve: The mitral valve is normal in structure. Moderate mitral valve regurgitation. Tricuspid Valve: The tricuspid valve is normal in structure. Tricuspid valve regurgitation is mild to moderate. Aortic Valve: The aortic valve is normal in structure. Aortic valve regurgitation is mild. Pulmonic Valve: The pulmonic valve was normal in structure. Pulmonic valve regurgitation is trivial. Aorta: The aortic root and ascending aorta are structurally normal, with no evidence of dilitation. IAS/Shunts: No atrial level shunt detected by color flow Doppler. LEFT VENTRICLE PLAX 2D LVIDd:         4.40 cm LVIDs:         3.20 cm LV PW:         1.10 cm LV IVS:        1.00 cm LVOT diam:     2.00 cm LVOT Area:     3.14 cm  LEFT ATRIUM         Index LA diam:    3.40 cm 2.23 cm/m   AORTA Ao Root diam: 2.90 cm  SHUNTS Systemic Diam: 2.00 cm Serafina Royals MD Electronically signed by Serafina Royals MD Signature Date/Time: 01/04/2022/4:56:35 PM    Final    DG Chest 1  View  Result Date: 01/04/2022 CLINICAL DATA:  81 year old female intubated.  Post CPR. EXAM: CHEST  1 VIEW COMPARISON:  Portable chest 01/03/2022 and earlier. FINDINGS: Portable AP semi upright view at 0611 hours. Endotracheal tube tip partially obscured by sternotomy hardware, but appears to be in good position between the level the clavicles and carina. Enteric tube courses into the abdomen, tip is not included. Stable pacer or resuscitation pads over the left chest. Stable lung volumes. Mediastinal contours remain normal. Allowing for portable technique the lungs are clear. No pneumothorax or pleural effusion identified. Negative visible bowel gas. Stable visualized osseous structures. IMPRESSION: 1. Satisfactory ET tube position. Enteric tube courses to the abdomen, tip not included. 2. No acute cardiopulmonary abnormality. Electronically Signed   By: Genevie Ann M.D.   On: 01/04/2022 07:26   DG Chest Portable 1 View  Result Date: 01/03/2022 CLINICAL DATA:  Intubation, post CPR EXAM: PORTABLE CHEST 1 VIEW COMPARISON:  01/03/2022 FINDINGS: Endotracheal tube is in the right mainstem bronchus. Recommend retracting approximately 5 cm. NG tube enters the stomach. Prior CABG. Heart is normal size. Increasing airspace disease throughout the left lung which may reflect atelectasis due to right mainstem intubation. Similar right upper lobe opacity/atelectasis. No effusions. IMPRESSION: Right mainstem intubation. Recommend retracting endotracheal tube approximately 5 cm. Areas of airspace disease in the left lung and right upper lobe may be related to atelectasis from right mainstem intubation. These results were called by telephone at the time of interpretation on 01/03/2022 at 10:21 pm to provider [facility] , who verbally acknowledged these results. Electronically Signed   By: Rolm Baptise M.D.   On: 01/03/2022 22:24   CT Cervical Spine Wo Contrast  Result Date: 01/03/2022 CLINICAL DATA:  Neck trauma (Age >= 65y)  EXAM: CT CERVICAL SPINE WITHOUT CONTRAST TECHNIQUE: Multidetector CT imaging of the cervical spine was performed without intravenous contrast. Multiplanar CT image reconstructions were also generated. RADIATION DOSE REDUCTION: This exam was performed according to the departmental dose-optimization program which includes automated exposure control, adjustment of the mA and/or kV according to patient  size and/or use of iterative reconstruction technique. COMPARISON:  None Available. FINDINGS: Alignment: Normal Skull base and vertebrae: No acute fracture. No primary bone lesion or focal pathologic process. Soft tissues and spinal canal: No prevertebral fluid or swelling. No visible canal hematoma. 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 Mild bilateral degenerative facet disease. Upper chest: No acute findings Other: None IMPRESSION: Degenerative disc and facet disease.  No acute bony abnormality. Electronically Signed   By: Rolm Baptise M.D.   On: 01/03/2022 20:10   CT HEAD WO CONTRAST (5MM)  Result Date: 01/03/2022 CLINICAL DATA:  Head trauma, minor (Age >= 65y) EXAM: CT HEAD WITHOUT CONTRAST TECHNIQUE: Contiguous axial images were obtained from the base of the skull through the vertex without intravenous contrast. RADIATION DOSE REDUCTION: This exam was performed according to the departmental dose-optimization program which includes automated exposure control, adjustment of the mA and/or kV according to patient size and/or use of iterative reconstruction technique. COMPARISON:  10/03/2021 FINDINGS: Brain: No acute intracranial abnormality. Specifically, no hemorrhage, hydrocephalus, mass lesion, acute infarction, or significant intracranial injury. Vascular: No hyperdense vessel or unexpected calcification. Skull: No acute calvarial abnormality. Sinuses/Orbits: No acute findings Other: None IMPRESSION: Normal study for patient's age. Electronically  Signed   By: Rolm Baptise M.D.   On: 01/03/2022 20:08   CT CHEST ABDOMEN PELVIS WO CONTRAST  Result Date: 01/03/2022 CLINICAL DATA:  Pneumonia, complication suspected, xray done. Shortness of breath. EXAM: CT CHEST, ABDOMEN AND PELVIS WITHOUT CONTRAST TECHNIQUE: Multidetector CT imaging of the chest, abdomen and pelvis was performed following the standard protocol without IV contrast. RADIATION DOSE REDUCTION: This exam was performed according to the departmental dose-optimization program which includes automated exposure control, adjustment of the mA and/or kV according to patient size and/or use of iterative reconstruction technique. COMPARISON:  None Available. FINDINGS: CT CHEST FINDINGS Cardiovascular: Heavily calcified aorta and coronary arteries. Prior CABG. Heart is normal size. Aorta is normal caliber. Mediastinum/Nodes: No mediastinal, hilar, or axillary adenopathy. Trachea and esophagus are unremarkable. Thyroid unremarkable. Lungs/Pleura: Lungs are clear. No focal airspace opacities or suspicious nodules. No effusions. Musculoskeletal: Chest wall soft tissues are unremarkable. No acute bony abnormality. CT ABDOMEN PELVIS FINDINGS Hepatobiliary: Small gallstone within the gallbladder. No focal hepatic abnormality. Pancreas: No focal abnormality or ductal dilatation. Spleen: No focal abnormality.  Normal size. Adrenals/Urinary Tract: Left kidney is atrophic. No renal or adrenal mass. No stones or hydronephrosis. Urinary bladder unremarkable. Stomach/Bowel: Left colonic diverticulosis. No active diverticulitis. Stomach and small bowel decompressed, unremarkable. Vascular/Lymphatic: Heavily calcified aorta. No evidence of aneurysm or adenopathy. Reproductive: Calcified fibroids in the uterus.  No adnexal masses. Other: No free fluid or free air. Musculoskeletal: No acute bony abnormality. Scoliosis and degenerative changes in the lumbar spine. IMPRESSION: No acute cardiopulmonary disease. Diffuse  aortoiliac atherosclerosis.  No aneurysm. Cholelithiasis. Left colonic diverticulosis.  No active diverticulitis. No acute findings in the abdomen or pelvis. Electronically Signed   By: Rolm Baptise M.D.   On: 01/03/2022 20:05   DG Chest 2 View  Result Date: 01/03/2022 CLINICAL DATA:  Shortness of breath EXAM: CHEST - 2 VIEW COMPARISON:  12/06/2021 FINDINGS: Prior CABG. Heart and mediastinal contours are within normal limits. No focal opacities or effusions. No acute bony abnormality. IMPRESSION: No active cardiopulmonary disease. Electronically Signed   By: Rolm Baptise M.D.   On: 01/03/2022 19:25    ECHO 01/04/2022  1. The left ventricle demonstrates regional wall motion abnormalities  (see  scoring diagram/findings for description).   2. Right ventricular systolic function is normal. The right ventricular  size is normal.   3. Left atrial size was moderately dilated.   4. The mitral valve is normal in structure. Moderate mitral valve  regurgitation.   5. Tricuspid valve regurgitation is mild to moderate.   6. The aortic valve is normal in structure. Aortic valve regurgitation is  mild.   FINDINGS   Left Ventricle: The left ventricle demonstrates regional wall motion  abnormalities. Moderate hypokinesis of the left ventricular, mid-apical  anterolateral wall.   Right Ventricle: The right ventricular size is normal. No increase in  right ventricular wall thickness. Right ventricular systolic function is  normal.   Left Atrium: Left atrial size was moderately dilated.   Right Atrium: Right atrial size was normal in size.   Pericardium: There is no evidence of pericardial effusion.   Mitral Valve: The mitral valve is normal in structure. Moderate mitral  valve regurgitation.   Tricuspid Valve: The tricuspid valve is normal in structure. Tricuspid  valve regurgitation is mild to moderate.   Aortic Valve: The aortic valve is normal in structure. Aortic valve  regurgitation is  mild.   Pulmonic Valve: The pulmonic valve was normal in structure. Pulmonic valve  regurgitation is trivial.   Aorta: The aortic root and ascending aorta are structurally normal, with  no evidence of dilitation.   IAS/Shunts: No atrial level shunt detected by color flow Doppler.   12/07/2021  1. Left ventricular ejection fraction, by estimation, is 55 to 60%. The  left ventricle has normal function. The left ventricle has no regional  wall motion abnormalities. Left ventricular diastolic parameters are  consistent with Grade I diastolic  dysfunction (impaired relaxation).   2. Right ventricular systolic function is normal. The right ventricular  size is normal.   3. The mitral valve is normal in structure. Moderate mitral valve  regurgitation.   4. The aortic valve is normal in structure. Aortic valve regurgitation is  mild.   FINDINGS   Left Ventricle: Left ventricular ejection fraction, by estimation, is 55  to 60%. The left ventricle has normal function. The left ventricle has no  regional wall motion abnormalities. The left ventricular internal cavity  size was normal in size. There is   no left ventricular hypertrophy. Left ventricular diastolic parameters  are consistent with Grade I diastolic dysfunction (impaired relaxation).   Right Ventricle: The right ventricular size is normal. No increase in  right ventricular wall thickness. Right ventricular systolic function is  normal.   Left Atrium: Left atrial size was normal in size.   Right Atrium: Right atrial size was normal in size.   Pericardium: There is no evidence of pericardial effusion.   Mitral Valve: The mitral valve is normal in structure. Moderate mitral  valve regurgitation. MV peak gradient, 4.8 mmHg. The mean mitral valve  gradient is 1.0 mmHg.   Tricuspid Valve: The tricuspid valve is grossly normal. Tricuspid valve  regurgitation is mild.   Aortic Valve: The aortic valve is normal in structure.  Aortic valve  regurgitation is mild. Aortic regurgitation PHT measures 567 msec. Aortic  valve mean gradient measures 10.0 mmHg. Aortic valve peak gradient  measures 16.9 mmHg. Aortic valve area, by VTI   measures 1.12 cm.   Pulmonic Valve: The pulmonic valve was normal in structure. Pulmonic valve  regurgitation is not visualized.   Aorta: The ascending aorta was not well visualized.   IAS/Shunts: No atrial level  shunt detected by color flow Doppler.   TELEMETRY reviewed by me: sinus rhythm rate 70s, multiple short runs of NSVT this morning.  EKG reviewed by me: sinus bradycardia 57, lateral ST depressions V5-6, improving on repeat. LBBB  ASSESSMENT AND PLAN:  Adrijana Cleere is an 2yoF with a PMH of CAD s/p CABG x2 in 2018 (patent LIMA to LAD,  occluded SVG to OM1 by [facility] 05/2021), h/o PCI x1 distal RCA (50% ISR by [facility] 05/2021), ischemic CM with LVEF with moderate hypo of LV mid apical anterolateral wall, 55-60%, g1dd, mod MR 12/2021, CKD 3, hyperlipidemia, type 2 diabetes, CAD, GERD who was recently hospitalized at [facility] from 7/12 - 7/18 initially for shortness of breath and developed chest pain, VF arrest with successful resuscitation and her NSTEMI was treated medically.  She presents to [facility] ED 2 days after discharge with acute encephalopathy, nausea, vomiting, and was intubated for airway protection in the ED. cardiology was consulted due to her significant cardiac history and concern for NSTEMI.  #Acute encephalopathy #recent NSTEMI 01/04/2022 - h/o significant CAD s/p CABG x2 with known occluded SVG to OM1, LIMA to LAD patent by [facility] 05/2021 #Ischemic cardiomyopathy #Acute hypoxic respiratory failure Patient has a complicated and significant history of CAD with multiple admissions over the past year for chest pain and has been medically managed due to patient preference.  She was most recently hospitalized last week with chest pain, VF arrest with ROSC requiring intubation.  Initial EKG  showed significant lateral ST depressions and echo showed a new LV mid apical anterolateral hypokinesis.  Her NSTEMI was ultimately treated medically.  She presents again 2 days after discharge with confusion and acting strangely with multiple episodes of emesis.  Her troponin is downtrending from last admission at 2206-1700 (compared to a peak of 6300 previously). Her EKG shows lateral ST depressions and LBBB, improving on repeats. UA is positive for leukocytes she is being treated for possible aspiration pneumonia.  -Agree with current therapy per primary team -Give 325 mg aspirin, continue DAPT with aspirin 81 mg and 75 mg clopidogrel daily. -S/p heparin drip for 48 hours, ended 7/22 -continue nitropaste 0.5inch q6h  -Continue atorvastatin 80 mg daily -S/p Levophed and vasopressin  -Restart her home antianginals/antihypertensives as her blood pressure allows: Metoprolol succinate 50 mg XL, isosorbide 30 mg daily. -Hold torsemide 20 mg, spironolactone 12.5 mg, lisinopril 20 mg due to AKI -Limited echo resulted with an EF of 45 to 50% with global hypokinesis, LV dilation -She is a DNR and has failed SBT the past 3 days, planning for one way extubation today.  Family understands limited role for invasive evaluation and the poor prognosis.  This patient's plan of care was discussed and created with Dr. Saralyn Pilar and he is in agreement.  Signed: Tristan Schroeder , PA-C 01/16/2022, 8:54 AM [facility] Cardiology

## 2022-01-16 NOTE — Consult Note (Signed)
PHARMACY CONSULT NOTE  Pharmacy Consult for Electrolyte Monitoring and Replacement   Recent Labs: Potassium (mmol/L)  Date Value  01/16/2022 3.1 (L)   Magnesium (mg/dL)  Date Value  16/03/9603 2.2   Calcium (mg/dL)  Date Value  54/02/8118 8.3 (L)   Albumin (g/dL)  Date Value  14/78/2956 2.6 (L)   Phosphorus (mg/dL)  Date Value  21/30/8657 3.9   Sodium (mmol/L)  Date Value  01/16/2022 141   Assessment: Patient is an 81 y/o F with medical history including CAD s/p CABG, HTN, HLD, DM, PAD, CHF (EF 45-50%), CKD-3a, RLS, recent admission 01/03/22 thru 01/09/22 for NSTEMI, Vfib cardiac arrest, AKI now presenting to the ED 7/20 with nausea, vomiting, SOB, lethargy. Patient ultimately required intubation.  Labs on admission notable for elevated troponin, BNP, Scr, hypokalemia  Goal of Therapy:  K >= 4, Mg >= 2 All other electrolytes within normal limits  Plan:  --K 3.1, Kcl 40 mEq per tube x 1 dose --Follow-up electrolytes again with AM labs tomorrow  Tressie Ellis 01/16/2022 7:57 AM

## 2022-01-16 NOTE — Progress Notes (Signed)
Pt extubated per MD order, pt tol procedure well, placed on 3Lnc

## 2022-01-16 NOTE — IPAL (Addendum)
  Interdisciplinary Goals of Care Family Meeting   Date carried out: 01/16/2022  Location of the meeting: Bedside  Member's involved: Physician, Bedside Registered Nurse, and Family Member or next of kin     GOALS OF CARE DISCUSSION  The Clinical status was relayed to family in detail-Daughter and friends  Updated and notified of patients medical condition- Explained to family course of therapy and the modalities   Patient with Progressive multiorgan failure with a very high probablity of a very minimal chance of meaningful recovery despite all aggressive and optimal medical therapy.  PATIENT REMAINS DNR status  Family understands the situation. Plan for one way extubation today and post extubation, patient now with resp distress Using accessory muscles to breathe family agrees that patient will try biPAP   Family are satisfied with Plan of action and management. All questions answered  Additional CC time 25 mins   Jozalynn Noyce Santiago Glad, M.D.  [facility]  Medical Director [facility] Medical Director [facility] Cardio-Pulmonary Department

## 2022-01-16 NOTE — Progress Notes (Signed)
Nutrition Follow-up  DOCUMENTATION CODES:   Non-severe (moderate) malnutrition in context of chronic illness  INTERVENTION:   -RD will follow for diet advancement and add supplements as appropriate   NUTRITION DIAGNOSIS:   Moderate Malnutrition related to chronic illness as evidenced by moderate muscle depletion, moderate fat depletion.  Ongoing  GOAL:   Patient will meet greater than or equal to 90% of their needs  Unmet  MONITOR:   PO intake, Supplement acceptance, Diet advancement  REASON FOR ASSESSMENT:   Ventilator    ASSESSMENT:   81 y/o female with h/o CAD s/p CABG x 2, PVD, GERD, CKD III, MI, DM, CHF, HTN, HLD and recent admission for VFib cardiac arrest and who is now admitted with acute metabolic encephalopathy, acute respiratory failure in the setting of acute on chronic HFpEF, elevated troponins (NSTEMI versus demand ischemia), UTI and presumed aspiration requiring intubation for airway protection.  7/25- extubated  Reviewed I/O's: +403 ml x 24 hours and +4.2 L since admission  UOP: 600 ml x 24 hours   Case discussed with RN, MD< and during ICU rounds. Pt was just extubated this morning. Per RN, breathing has been short and shallow. Pt is currently on 3 L of oxygen and is a high aspiration risk. Plan to transition to step down status today.   Medications reviewed and include colace, lasix, and miralax.   Labs reviewed: CBGS: 127-173 (inpatient orders for glycemic control are 0-15 units insulin aspart every 4 hours).    Diet Order:   Diet Order             Diet NPO time specified  Diet effective now                   EDUCATION NEEDS:   No education needs have been identified at this time  Skin:  Skin Assessment: Reviewed RN Assessment (ecchymosis)  Last BM:  01/15/22  Height:   Ht Readings from Last 1 Encounters:  01/15/22 4' 11.02" (1.499 m)    Weight:   Wt Readings from Last 1 Encounters:  01/16/22 60.1 kg    Ideal Body  Weight:  44.5 kg  BMI:  Body mass index is 26.75 kg/m.  Estimated Nutritional Needs:   Kcal:  1600-1800  Protein:  80-95 grams  Fluid:  > 1.6 L    Levada Schilling, RD, LDN, CDCES Registered Dietitian II Certified Diabetes Care and Education Specialist Please refer to [facility] for RD and/or RD on-call/weekend/after hours pager

## 2022-01-16 NOTE — Progress Notes (Signed)
Pt placed on BiPAP per MD order for increased WOB. Pt did not tol for more than 5 min. Mask removed and pt became nauseous and vomiting. Pt placed back on 3Lnc and emesis bag offered. RN aware.

## 2022-01-17 ENCOUNTER — Inpatient Hospital Stay: Payer: Medicare Other

## 2022-01-17 DIAGNOSIS — I214 Non-ST elevation (NSTEMI) myocardial infarction: Secondary | ICD-10-CM | POA: Diagnosis not present

## 2022-01-17 DIAGNOSIS — R509 Fever, unspecified: Secondary | ICD-10-CM

## 2022-01-17 LAB — URINALYSIS, COMPLETE (UACMP) WITH MICROSCOPIC
Bacteria, UA: NONE SEEN
Bilirubin Urine: NEGATIVE
Glucose, UA: NEGATIVE mg/dL
Hgb urine dipstick: NEGATIVE
Ketones, ur: NEGATIVE mg/dL
Leukocytes,Ua: NEGATIVE
Nitrite: NEGATIVE
Protein, ur: NEGATIVE mg/dL
Specific Gravity, Urine: 1.006 (ref 1.005–1.030)
pH: 5 (ref 5.0–8.0)

## 2022-01-17 LAB — GLUCOSE, CAPILLARY
Glucose-Capillary: 102 mg/dL — ABNORMAL HIGH (ref 70–99)
Glucose-Capillary: 103 mg/dL — ABNORMAL HIGH (ref 70–99)
Glucose-Capillary: 190 mg/dL — ABNORMAL HIGH (ref 70–99)
Glucose-Capillary: 192 mg/dL — ABNORMAL HIGH (ref 70–99)
Glucose-Capillary: 97 mg/dL (ref 70–99)
Glucose-Capillary: 99 mg/dL (ref 70–99)

## 2022-01-17 LAB — CBC
HCT: 26.3 % — ABNORMAL LOW (ref 36.0–46.0)
Hemoglobin: 8.1 g/dL — ABNORMAL LOW (ref 12.0–15.0)
MCH: 31 pg (ref 26.0–34.0)
MCHC: 30.8 g/dL (ref 30.0–36.0)
MCV: 100.8 fL — ABNORMAL HIGH (ref 80.0–100.0)
Platelets: 361 10*3/uL (ref 150–400)
RBC: 2.61 MIL/uL — ABNORMAL LOW (ref 3.87–5.11)
RDW: 15 % (ref 11.5–15.5)
WBC: 13.2 10*3/uL — ABNORMAL HIGH (ref 4.0–10.5)
nRBC: 0 % (ref 0.0–0.2)

## 2022-01-17 LAB — RESPIRATORY PANEL BY PCR

## 2022-01-17 LAB — BASIC METABOLIC PANEL
Anion gap: 9 (ref 5–15)
BUN: 49 mg/dL — ABNORMAL HIGH (ref 8–23)
CO2: 27 mmol/L (ref 22–32)
Calcium: 8.5 mg/dL — ABNORMAL LOW (ref 8.9–10.3)
Chloride: 103 mmol/L (ref 98–111)
Creatinine, Ser: 1.2 mg/dL — ABNORMAL HIGH (ref 0.44–1.00)
GFR, Estimated: 46 mL/min — ABNORMAL LOW (ref >60)
Glucose, Bld: 98 mg/dL (ref 70–99)
Potassium: 3.7 mmol/L (ref 3.5–5.1)
Sodium: 139 mmol/L (ref 135–145)

## 2022-01-17 MED ORDER — NITROGLYCERIN 2 % TD OINT
0.5000 [in_us] | TOPICAL_OINTMENT | Freq: Four times a day (QID) | TRANSDERMAL | Status: DC | PRN
  Administered 2022-01-18 – 2022-01-19 (×2): 0.5 [in_us] via TOPICAL
  Filled 2022-01-17 (×3): qty 1

## 2022-01-17 MED ORDER — IPRATROPIUM-ALBUTEROL 0.5-2.5 (3) MG/3ML IN SOLN
3.0000 mL | Freq: Four times a day (QID) | RESPIRATORY_TRACT | Status: DC | PRN
  Administered 2022-01-17 – 2022-01-20 (×3): 3 mL via RESPIRATORY_TRACT
  Filled 2022-01-17 (×3): qty 3

## 2022-01-17 MED ORDER — ADULT MULTIVITAMIN W/MINERALS CH
1.0000 | ORAL_TABLET | Freq: Every day | ORAL | Status: DC
  Administered 2022-01-18 – 2022-01-23 (×5): 1 via ORAL
  Filled 2022-01-17 (×5): qty 1

## 2022-01-17 MED ORDER — POTASSIUM CHLORIDE 20 MEQ PO PACK
40.0000 meq | PACK | Freq: Once | ORAL | Status: AC
Start: 2022-01-17 — End: 2022-01-17
  Administered 2022-01-17: 40 meq via ORAL
  Filled 2022-01-17: qty 2

## 2022-01-17 MED ORDER — ENSURE ENLIVE PO LIQD
237.0000 mL | Freq: Three times a day (TID) | ORAL | Status: DC
  Administered 2022-01-17 – 2022-01-23 (×12): 237 mL via ORAL

## 2022-01-17 MED ORDER — METOPROLOL SUCCINATE ER 25 MG PO TB24
12.5000 mg | ORAL_TABLET | Freq: Every day | ORAL | Status: DC
  Administered 2022-01-17 – 2022-01-21 (×5): 12.5 mg via ORAL
  Filled 2022-01-17: qty 0.5
  Filled 2022-01-17: qty 1
  Filled 2022-01-17: qty 0.5
  Filled 2022-01-17: qty 1
  Filled 2022-01-17: qty 0.5
  Filled 2022-01-17: qty 1

## 2022-01-17 MED ORDER — LOPERAMIDE HCL 2 MG PO CAPS
2.0000 mg | ORAL_CAPSULE | ORAL | Status: DC | PRN
  Administered 2022-01-17: 2 mg via ORAL
  Filled 2022-01-17: qty 1

## 2022-01-17 MED ORDER — FUROSEMIDE 10 MG/ML IJ SOLN
40.0000 mg | Freq: Two times a day (BID) | INTRAMUSCULAR | Status: AC
  Administered 2022-01-17 (×2): 40 mg via INTRAVENOUS
  Filled 2022-01-17 (×2): qty 4

## 2022-01-17 NOTE — Consult Note (Addendum)
PHARMACY CONSULT NOTE  Pharmacy Consult for Electrolyte Monitoring and Replacement   Recent Labs: Potassium (mmol/L)  Date Value  01/17/2022 3.7   Magnesium (mg/dL)  Date Value  50/56/9794 2.2   Calcium (mg/dL)  Date Value  80/16/5537 8.5 (L)   Albumin (g/dL)  Date Value  48/27/0786 2.6 (L)   Phosphorus (mg/dL)  Date Value  75/44/9201 3.9   Sodium (mmol/L)  Date Value  01/17/2022 139   Assessment: Patient is an 81 y/o F with medical history including CAD s/p CABG, HTN, HLD, DM, PAD, CHF (EF 45-50%), CKD-3a, RLS, recent admission 01/03/22 thru 01/09/22 for NSTEMI, Vfib cardiac arrest, AKI now presenting to the ED 7/20 with nausea, vomiting, SOB, lethargy. Patient ultimately required intubation.  Labs on admission notable for elevated troponin, BNP, Scr, hypokalemia  Goal of Therapy:  K >= 4, Mg >= 2 All other electrolytes within normal limits  Plan:  --No electrolyte replacement indicated at this time --Patient care transferred from PCCM to [facility]. Will discontinue electrolyte consult at this time. Defer further ordering of labs and electrolyte replacement to primary team --Pharmacy will continue to follow along peripherally  Tressie Ellis 01/17/2022 8:05 AM

## 2022-01-17 NOTE — Assessment & Plan Note (Addendum)
Unstable angina CAD s/p CABG Ischemic cardiomyopathy HTN HFrEF  Managed medically  Cardiology following - no further invasive procedures planned,  another 40mg  IV lasix today --> on discharge, plan for torsemide 20 mg daily for 3 days starting tomorrow then down to 10 mg daily  restart isosorbide 15 mg daily --> increased to 30 mg 07/29 --> 60 mg 07/31  metoprolol XL 12.5mg  --> 25 mg daily  Restarted lisinopril 10 mg daily  DAPT with aspirin 81 mg and 75 mg clopidogrel daily  nitropaste 0.5inch q6h  atorvastatin 80 mg nightly  (holding spironolactone 12.5 mg)  Cardiology states she will likely have chronic angina from her severe CAD --> palliative consult and will follow outpatient

## 2022-01-17 NOTE — Evaluation (Signed)
Physical Therapy Evaluation Patient Details Name: Lindsey Pollard MRN: [id number] DOB: [birth year] Today's Date: 01/17/2022  History of Present Illness  81 year old female who presents to [facility] ED on 01/11/2022 due to altered mental status, respiratory distress, and nausea/vomiting. Pt intubated 7/20-25, She was recently admitted 7/12 - 18 for NSTEMI and acute CHF, along with acute kidney injury, discharged home with PT/OT and cardiac rehab but continued to be weakwith a past medical history significant for recent V-fib arrest, HFpEF, ischemic cardiomyopathy, CAD status post CABG x2 in 2018 and PCI, chronic kidney disease stage III, diabetes mellitus.  Clinical Impression  Pt very weak, as expected post intubation, but did show very good effort with limited exercises and bed mobility.  Her O2 (on room air) remained in the high 80s/low 90s and HR was relatively stable in the 100s most of the time.  However despite good determination she tolerated only 30-40 seconds of sitting EOB before feeling complete fatigued and dizzy (BP 124/56) and needing to get back to bed.  Pt desires to go home but realizes she is far too weak to do so at discharge and is open to the idea of rehab.       Recommendations for follow up therapy are one component of a multi-disciplinary discharge planning process, led by the attending physician.  Recommendations may be updated based on patient status, additional functional criteria and insurance authorization.  Follow Up Recommendations Skilled nursing-short term rehab (<3 hours/day) Can patient physically be transported by private vehicle: No    Assistance Recommended at Discharge Frequent or constant Supervision/Assistance  Patient can return home with the following  Two people to help with walking and/or transfers;A lot of help with walking and/or transfers;A lot of help with bathing/dressing/bathroom;Assistance with cooking/housework;Assist for transportation;Help with stairs  or ramp for entrance    Equipment Recommendations  (TBD at rehab)  Recommendations for Other Services       Functional Status Assessment Patient has had a recent decline in their functional status and demonstrates the ability to make significant improvements in function in a reasonable and predictable amount of time.     Precautions / Restrictions Precautions Precautions: Fall Restrictions Weight Bearing Restrictions: No      Mobility  Bed Mobility Overal bed mobility: Needs Assistance Bed Mobility: Supine to Sit, Sit to Supine     Supine to sit: Min assist Sit to supine: Mod assist   General bed mobility comments: Good effort with getting to EOB, heavy UE/rail use, did much of the effort but needing assist in and out of bed    Transfers                   General transfer comment: deferred, pt only tolerated 30-40 seconds of sitting before needing to lay back down    Ambulation/Gait                  Stairs            Wheelchair Mobility    Modified Rankin (Stroke Patients Only)       Balance Overall balance assessment: Needs assistance   Sitting balance-Leahy Scale: Fair Sitting balance - Comments: Once assisted to EOB positioning she was able to briefly maintain w/o assist but had dizziness and despite good effort ultimately needed to lay back down  Pertinent Vitals/Pain Pain Assessment Pain Assessment: No/denies pain    Home Living Family/patient expects to be discharged to:: Skilled nursing facility Living Arrangements: Non-relatives/Friends Available Help at Discharge: Friend(s);Available PRN/intermittently   Home Access: Level entry       Home Layout: One level Home Equipment: Agricultural consultant (2 wheels);Gilmer Mor - single point Additional Comments: Lives with friend who recently had a stroke. Uses uber/lyft to go get to/from grocery store.    Prior Function Prior Level of  Function : Independent/Modified Independent             Mobility Comments: until a few weeks ago apparently only rarely needed AD but not out of the home much       Hand Dominance        Extremity/Trunk Assessment   Upper Extremity Assessment Upper Extremity Assessment: Generalized weakness    Lower Extremity Assessment Lower Extremity Assessment: Generalized weakness (L weaker than R, functional strength considering multi day intubation)       Communication   Communication: HOH  Cognition Arousal/Alertness: Awake/alert Behavior During Therapy: WFL for tasks assessed/performed Overall Cognitive Status: Within Functional Limits for tasks assessed                                          General Comments General comments (skin integrity, edema, etc.): O2 in the high 80s/low 90s on room air t/o the effort, BP during sitting dizziness (during transition back to supine) 124/56, HR relatively stable in the 100s most of the time    Exercises     Assessment/Plan    PT Assessment Patient needs continued PT services  PT Problem List Decreased strength;Cardiopulmonary status limiting activity;Decreased activity tolerance;Decreased balance;Decreased mobility;Decreased knowledge of precautions;Decreased safety awareness;Decreased knowledge of use of DME       PT Treatment Interventions DME instruction;Balance training;Therapeutic exercise;Gait training;Neuromuscular re-education;Functional mobility training;Cognitive remediation;Therapeutic activities;Patient/family education    PT Goals (Current goals can be found in the Care Plan section)  Acute Rehab PT Goals Patient Stated Goal: get better, go home PT Goal Formulation: With patient Time For Goal Achievement: 01/30/22 Potential to Achieve Goals: Good    Frequency Min 2X/week     Co-evaluation               AM-PAC PT "6 Clicks" Mobility  Outcome Measure Help needed turning from your back to  your side while in a flat bed without using bedrails?: A Little Help needed moving from lying on your back to sitting on the side of a flat bed without using bedrails?: A Lot Help needed moving to and from a bed to a chair (including a wheelchair)?: Total Help needed standing up from a chair using your arms (e.g., wheelchair or bedside chair)?: Total Help needed to walk in hospital room?: Total Help needed climbing 3-5 steps with a railing? : Total 6 Click Score: 9    End of Session   Activity Tolerance: Patient tolerated treatment well Patient left: in bed;with call bell/phone within reach;with bed alarm set Nurse Communication: Mobility status PT Visit Diagnosis: Muscle weakness (generalized) (M62.81)    Time: 0300-9233 PT Time Calculation (min) (ACUTE ONLY): 27 min   Charges:   PT Evaluation $PT Eval Low Complexity: 1 Low PT Treatments $Therapeutic Activity: 8-22 mins        Malachi Pro, DPT 01/17/2022, 6:00 PM

## 2022-01-17 NOTE — Progress Notes (Signed)
Taking over patient's care at approximately 0900.

## 2022-01-17 NOTE — Assessment & Plan Note (Addendum)
RESOLVED Temp was increasing 07/26, no cocnerns repeat BCx, CXR, UA, resp PCR  remove foley

## 2022-01-17 NOTE — Hospital Course (Addendum)
81 year old female with a past medical history significant for recent V-fib arrest, HFpEF, ischemic cardiomyopathy, CAD status post CABG x2 in 2018 and PCI, chronic kidney disease stage III, diabetes mellitus who presents to [facility] ED on 01/11/2022 due to altered mental status, respiratory distress, and nausea/vomiting. (Of note: Was recently admitted from 7/12 through 7/18 for brief cardiac arrest in the setting of NSTEMI and acute CHF, along with acute kidney injury, discharged home with PT/OT and cardiac rehab but continued to be weak, very sleepy (more than normal), with poor p.o. intake.) 07/20: Intubated in ED, cardiology consulted for NSTEMI, admitted to PCCM. 07/21: Multiorgan failure, requiring pressors, poor prognosis 07/22, 07/23: DNR. Failed weaning trials 07/24: failed SAT/SBT d/t ischemic cardiomyopathy  07/25: No further interventions per cardiology. GOC discussion w/ Dr Belia Heman (ICU), remains DNR, agreed to extubation, increased WOB and trial of BiPap but pt did not tolerate this. Plan transfer to [facility] tomorrow.  07/26: TRH pickup from ICU. O2 requirement lower, will transfer out of SDU. PT/OT pending will likely need SNF/rehab. Temp increasing, repeat BCx, CXR, UA (no apparent UTI), resp PCR (negative), remove foley, may need to restart abx. CXR shows increased edema 07/27: lasix dose x2, WBC trending down, afebrile. BCx NG<24h. O2 saturation good on 2L but notable increased WOB on exam.  07/28: good UOP yesterday w/ Lasix. RR still occasionally high. SNF placement per TOC.  Per cardiology, patient has expressed a wish that she will eventually go home with her daughter and "be comfortable" and not have her come back to the hospital.  [facility] working on SNF placement here and will explore palliative/hospice options in daughter's home state, patient has declined palliative consult here. 07/29: continues to have subjective SOB relieved by neb tx, SpO2 has been 97% on 3L, continued intermittent chest  pain. Awaiting SNF placement.  GOC discussion with patient, see assessment/plan Progression goals: Appreciate cardiology recs re: discharge readiness, home meds on d/c and outpatient follow-up SNF placement pending.  Today is Saturday, may be in hospital through the weekend to ensure cardiac/respiratory stabilization and SNF placement Monday

## 2022-01-17 NOTE — Progress Notes (Signed)
[place] NOTE       Patient ID: Lindsey Pollard MRN: [id number] DOB/AGE: 28-Oct-1940 81 y.o.  Admit date: 01/11/2022 Referring Physician Dr. Merlyn Lot Primary Physician Dr. [name]  Primary Cardiologist Dr. Clayborn Bigness Reason for Consultation NSTEMI  HPI: Lindsey Pollard is an 81yoF with a PMH of CAD s/p CABG x2 in 2018 (patent LIMA to LAD,  occluded SVG to OM1 by [facility] 05/2021), h/o PCI x1 distal RCA (50% ISR by [facility] 05/2021), ischemic CM with LVEF with moderate hypo of LV mid apical anterolateral wall, 55-60%, g1dd, mod MR 12/2021, CKD 3, hyperlipidemia, type 2 diabetes, CAD, GERD who was recently hospitalized at [facility] from 7/12 - 7/18 initially for shortness of breath and developed chest pain, VF arrest with successful resuscitation and her NSTEMI was treated medically.  She presents to [facility] ED 2 days after discharge with acute encephalopathy, nausea, vomiting, and was intubated for airway protection in the ED. cardiology was consulted due to her significant cardiac history and concern for NSTEMI. Successfully extubated 7/26 to 3L   Interval History:  - successfully extubated yesterday, now on 3L [place] - feels "jittery" but denies chest pain, SOB - family discussing dispo location and plan after discharge   Past Medical History:  Diagnosis Date   Chest pain 12/06/2021   Diabetes mellitus without complication ([place])    Hyperlipidemia    Hypertension    Myocardial infarction [facility])     Past Surgical History:  Procedure Laterality Date   CARDIAC SURGERY     bypass   CORONARY/GRAFT ACUTE MI REVASCULARIZATION N/A 06/01/2021   Procedure: Coronary/Graft Acute MI Revascularization;  Surgeon: Yolonda Kida, MD;  Location: [place] [facility];  Service: Cardiovascular;  Laterality: N/A;   EYE SURGERY     cataract extraction   LEFT HEART CATH AND CORONARY ANGIOGRAPHY N/A 06/01/2021   Procedure: LEFT HEART CATH AND CORONARY ANGIOGRAPHY;  Surgeon: Yolonda Kida, MD;  Location: [place] [facility];  Service: Cardiovascular;  Laterality: N/A;    Medications Prior to Admission  Medication Sig Dispense Refill Last Dose   acetaminophen (TYLENOL) 325 MG tablet Take 650 mg by mouth at bedtime.   PRN at PRN   albuterol (VENTOLIN HFA) 108 (90 Base) MCG/ACT inhaler Inhale 1-2 puffs into the lungs every 4 (four) hours as needed for shortness of breath.      aspirin 81 MG EC tablet Take by mouth.      atorvastatin (LIPITOR) 80 MG tablet Take 80 mg by mouth every evening.      baclofen (LIORESAL) 10 MG tablet Take 10 mg by mouth 3 (three) times daily.      Cholecalciferol 25 MCG (1000 UT) capsule Take 1,000 Units by mouth 2 (two) times daily.      clopidogrel (PLAVIX) 75 MG tablet Take 1 tablet (75 mg total) by mouth daily. 30 tablet 0    feeding supplement (ENSURE ENLIVE / ENSURE PLUS) LIQD Take 237 mLs by mouth 3 (three) times daily between meals. 237 mL 12    insulin glargine (LANTUS) 100 UNIT/ML injection Inject 8 Units into the skin at bedtime.      insulin lispro (HUMALOG) 100 UNIT/ML KwikPen Inject 0-2 Units into the skin 3 (three) times daily.      ipratropium-albuterol (DUONEB) 0.5-2.5 (3) MG/3ML SOLN Take 3 mLs by nebulization every 6 (six) hours as needed (shortness of breath).      isosorbide mononitrate (IMDUR) 30 MG 24 hr tablet Take 1  tablet (30 mg total) by mouth daily. 30 tablet 1    lisinopril (ZESTRIL) 20 MG tablet Take 1 tablet (20 mg total) by mouth daily. 30 tablet 1    lisinopril-hydrochlorothiazide (ZESTORETIC) 10-12.5 MG tablet Take 1 tablet by mouth daily.      metoprolol succinate (TOPROL-XL) 50 MG 24 hr tablet Take 50 mg by mouth daily.      Multiple Vitamin (MULTIVITAMIN WITH MINERALS) TABS tablet Take 1 tablet by mouth daily.      nitroGLYCERIN (NITROSTAT) 0.4 MG SL tablet Place 0.4 mg under the tongue every 5 (five) minutes as needed for chest pain.      ondansetron (ZOFRAN-ODT) 4 MG disintegrating tablet Take 4 mg by mouth  every 8 (eight) hours as needed.      senna (SENOKOT) 8.6 MG tablet Take 2 tablets by mouth at bedtime.      SPIRIVA HANDIHALER 18 MCG inhalation capsule Place 1 capsule into inhaler and inhale daily as needed for shortness of breath.      spironolactone (ALDACTONE) 25 MG tablet Take 12.5 mg by mouth daily.      torsemide (DEMADEX) 20 MG tablet Take 20 mg by mouth daily.      Social History   Socioeconomic History   Marital status: Divorced    Spouse name: Not on file   Number of children: Not on file   Years of education: Not on file   Highest education level: Not on file  Occupational History   Not on file  Tobacco Use   Smoking status: Former    Types: Cigarettes   Smokeless tobacco: Never  Substance and Sexual Activity   Alcohol use: Not Currently   Drug use: Not Currently   Sexual activity: Not on file  Other Topics Concern   Not on file  Social History Narrative   Not on file   Social Determinants of Health   Financial Resource Strain: Not on file  Food Insecurity: Not on file  Transportation Needs: Not on file  Physical Activity: Not on file  Stress: Not on file  Social Connections: Not on file  Intimate Partner Violence: Not on file    Family History  Problem Relation Age of Onset   Congestive Heart Failure Mother    Diabetes Father    Breast cancer Neg Hx      PHYSICAL EXAM General: Elderly and chronically ill-appearing Caucasian female sitting upright in ICU bed with daughter at bedside HEENT:  Normocephalic and atraumatic. Neck:  No JVD.  Lungs: somewhat short of breath appearing on 3L. Bibasilar crackles Heart: HRRR . Normal S1 and S2 with 3/6 systolic murmur heard throughout.  Radial pulses nonpalpable Abdomen: Non-distended appearing.  Msk: Normal strength and tone for age. Extremities: Warm without clubbing, cyanosis.  No peripheral edema.  Neuro: Awake and alert.  Psych: Answers questions appropriately.   Labs:   Lab Results  Component  Value Date   WBC 9.7 01/15/2022   HGB 7.2 (L) 01/15/2022   HCT 23.1 (L) 01/15/2022   MCV 98.3 01/15/2022   PLT 264 01/15/2022    Recent Labs  Lab 01/15/22 0456 01/16/22 0443 01/17/22 0330  NA 138   < > 139  K 3.7   < > 3.7  CL 102   < > 103  CO2 28   < > 27  BUN 37*   < > 49*  CREATININE 1.31*   < > 1.20*  CALCIUM 7.9*   < > 8.5*  PROT 5.8*  --   --  BILITOT 0.7  --   --   ALKPHOS 65  --   --   ALT 17  --   --   AST 16  --   --   GLUCOSE 188*   < > 98   < > = values in this interval not displayed.    No results found for: "CKTOTAL", "CKMB", "CKMBINDEX", "TROPONINI"  Lab Results  Component Value Date   CHOL 102 12/07/2021   CHOL 127 06/01/2021   Lab Results  Component Value Date   HDL 31 (L) 12/07/2021   HDL 47 06/01/2021   Lab Results  Component Value Date   LDLCALC 45 12/07/2021   LDLCALC 49 06/01/2021   Lab Results  Component Value Date   TRIG 121 01/16/2022   TRIG 101 01/04/2022   TRIG 128 12/07/2021   Lab Results  Component Value Date   CHOLHDL 3.3 12/07/2021   CHOLHDL 2.7 06/01/2021   No results found for: "LDLDIRECT"    Radiology: [facility] Chest Port 1 View  Result Date: 01/15/2022 CLINICAL DATA:  Respiratory failure EXAM: PORTABLE CHEST 1 VIEW COMPARISON:  Prior chest x-ray 11/07/2041 FINDINGS: Endotracheal tube present. The tip is 4.7 cm above the carina. Left IJ approach central venous catheter with the catheter tip at the cavoatrial junction. A gastric tube is present. The proximal side-hole is visualized over the gastric bubble. Patient is status post median sternotomy with evidence of multivessel CABG. Sternal fixation hardware in place. Cardiac and mediastinal contours are within normal limits. Increased pulmonary vascular congestion now with mild interstitial edema. Probable small bilateral pleural effusions with associated bibasilar atelectasis. No pneumothorax. No acute osseous abnormality. IMPRESSION: 1. Stable and satisfactory support apparatus.  2. Increased pulmonary vascular congestion now with mild interstitial edema. 3. Small bilateral layering pleural effusions. Electronically Signed   By: Jacqulynn Cadet M.D.   On: 01/15/2022 10:15   ECHOCARDIOGRAM LIMITED  Result Date: 01/12/2022    ECHOCARDIOGRAM LIMITED REPORT   Patient Name:   Lindsey Pollard [facility] Date of Exam: 01/12/2022 Medical Rec #:  [id number]     Height:       59.0 in Accession #:    [id number]    Weight:       140.4 lb Date of Birth:  [birth year]     BSA:          1.587 m Patient Age:    82 years      BP:           146/47 mmHg Patient Gender: F             HR:           51 bpm. Exam Location:  [facility] Procedure: Limited Echo, Cardiac Doppler, Color Doppler and Intracardiac            Opacification Agent Indications:     Elevated Troponin  History:         Patient has prior history of Echocardiogram examinations, most                  recent 01/04/2022.  Sonographer:     Sherrie Sport Referring Phys:  [id number] [place] Shaletta Hinostroza Diagnosing Phys: Yolonda Kida MD  Sonographer Comments: Suboptimal parasternal window and suboptimal apical window. IMPRESSIONS  1. Left ventricular ejection fraction, by estimation, is 45 to 50%. The left ventricle has mildly decreased function. The left ventricle demonstrates global hypokinesis. The left ventricular internal cavity size was mildly dilated. Left ventricular diastolic function could  not be evaluated.  2. Right ventricular systolic function is normal.  3. Moderate pleural effusion in the left lateral region.  4. The mitral valve is normal in structure.  5. The aortic valve is normal in structure. Aortic valve regurgitation is not visualized. Conclusion(s)/Recommendation(s): Poor windows for evaluation of left ventricular function by transthoracic echocardiography. Would recommend an alternative means of evaluation. FINDINGS  Left Ventricle: Left ventricular ejection fraction, by estimation, is 45 to 50%. The left ventricle has mildly decreased function. The  left ventricle demonstrates global hypokinesis. Definity contrast agent was given IV to delineate the left ventricular  endocardial borders. The left ventricular internal cavity size was mildly dilated. There is no left ventricular hypertrophy. Left ventricular diastolic function could not be evaluated. Right Ventricle: No increase in right ventricular wall thickness. Right ventricular systolic function is normal. Left Atrium: Left atrial size was normal in size. Right Atrium: Right atrial size was normal in size. Mitral Valve: The mitral valve is normal in structure. Tricuspid Valve: The tricuspid valve is normal in structure. Aortic Valve: The aortic valve is normal in structure. Aortic valve regurgitation is not visualized. Pulmonic Valve: The pulmonic valve was normal in structure. Aorta: The ascending aorta was not well visualized. IAS/Shunts: No atrial level shunt detected by color flow Doppler. Additional Comments: There is a moderate pleural effusion in the left lateral region. LEFT VENTRICLE PLAX 2D LVIDd:         4.70 cm LVIDs:         3.40 cm LV PW:         1.10 cm LV IVS:        0.90 cm LVOT diam:     2.00 cm LVOT Area:     3.14 cm  LEFT ATRIUM         Index LA diam:    3.00 cm 1.89 cm/m   AORTA Ao Root diam: 2.90 cm TRICUSPID VALVE TR Peak grad:   28.9 mmHg TR Vmax:        269.00 cm/s  SHUNTS Systemic Diam: 2.00 cm Yolonda Kida MD Electronically signed by Yolonda Kida MD Signature Date/Time: 01/12/2022/2:52:11 PM    Final    DG Chest Port 1 View  Result Date: 01/11/2022 CLINICAL DATA:  Central line placement. EXAM: PORTABLE CHEST 1 VIEW COMPARISON:  Radiograph and CT earlier today. FINDINGS: New left internal jugular central venous catheter tip overlies the atrial caval junction. No pneumothorax. Stable endotracheal and enteric tubes. Stable heart size and mediastinal contours. Small bilateral pleural effusions, increasing on the left. No pulmonary edema or new airspace disease.  IMPRESSION: 1. New left internal jugular central venous catheter with tip over the atrial caval junction. No pneumothorax. 2. Stable endotracheal and enteric tubes. 3. Small bilateral pleural effusions, increasing on the left. Electronically Signed   By: Keith Rake M.D.   On: 01/11/2022 18:41   CT HEAD WO CONTRAST (5MM)  Result Date: 01/11/2022 CLINICAL DATA:  Altered mental status EXAM: CT HEAD WITHOUT CONTRAST TECHNIQUE: Contiguous axial images were obtained from the base of the skull through the vertex without intravenous contrast. RADIATION DOSE REDUCTION: This exam was performed according to the departmental dose-optimization program which includes automated exposure control, adjustment of the mA and/or kV according to patient size and/or use of iterative reconstruction technique. COMPARISON:  01/03/2022 FINDINGS: Brain: No acute intracranial findings are seen in noncontrast CT brain. There are no signs of bleeding within the cranium. Cortical sulci are prominent. Vascular: Unremarkable. Skull: Unremarkable  Sinuses/Orbits: Unremarkable. Other: None. IMPRESSION: No acute intracranial findings are seen.  Atrophy. Electronically Signed   By: Elmer Picker M.D.   On: 01/11/2022 08:28   CT CHEST ABDOMEN PELVIS WO CONTRAST  Result Date: 01/11/2022 CLINICAL DATA:  Shortness of breath, altered mental status EXAM: CT CHEST, ABDOMEN AND PELVIS WITHOUT CONTRAST TECHNIQUE: Multidetector CT imaging of the chest, abdomen and pelvis was performed following the standard protocol without IV contrast. RADIATION DOSE REDUCTION: This exam was performed according to the departmental dose-optimization program which includes automated exposure control, adjustment of the mA and/or kV according to patient size and/or use of iterative reconstruction technique. COMPARISON:  01/03/2022 FINDINGS: CT CHEST FINDINGS Cardiovascular: Extensive coronary artery calcifications are seen. There is evidence of previous coronary  bypass surgery. Scattered calcifications are seen in thoracic aorta and its major branches. Mediastinum/Nodes: Slightly enlarged lymph nodes are seen in mediastinum with no significant interval change. Tip of endotracheal tube is at the carina and should be pulled back 2-3 cm. Lungs/Pleura: Small patchy infiltrates are seen in left parahilar region and posterior lower lung fields. There is interval appearance of small bilateral pleural effusions. There is no pneumothorax. There is slight thickening of interlobular septi. Musculoskeletal: No acute findings are seen. CT ABDOMEN PELVIS FINDINGS Hepatobiliary: No focal abnormalities are seen in liver. There is no dilation of bile ducts. Gallbladder is distended, possibly due to fasting state. There is no wall thickening in gallbladder. Gallbladder stones are seen. Pancreas: No focal abnormalities are seen. Diverticulum is noted along the inner margin of second portion of duodenum. Spleen: Unremarkable. Adrenals/Urinary Tract: Adrenals are unremarkable. Left kidney is much smaller than right. This may be a congenital variation or suggest renal artery stenosis or chronic pyelonephritis. There is no hydronephrosis. There are no renal or ureteral stones. Foley catheter is seen in the bladder. Stomach/Bowel: Tip of enteric tube is seen in the stomach. There is no dilation of small bowel loops. Appendix is unremarkable. There is no significant wall thickening in colon. Scattered diverticula are seen in the colon without signs of focal acute diverticulitis. Vascular/Lymphatic: Extensive arterial calcifications are seen. Reproductive: Coarse calcifications seen in pelvis may suggest calcified uterine fibroids. Other: There is no ascites or pneumoperitoneum. Musculoskeletal: Degenerative changes are noted in lumbar spine, particularly severe at L2-L3 and L3-L4 levels. This finding has not changed significantly. IMPRESSION: Tip of endotracheal tube is at the carina and should be  pulled back 2-3 cm. There is interval appearance of small bilateral pleural effusions, more so on the left side. There is thickening of interlobular septi suggesting possible interstitial pulmonary edema. There are patchy densities in the left parahilar region and both lower lung fields which may be due to pulmonary edema or pneumonia. Extensive coronary artery calcifications are seen. There is no evidence of intestinal obstruction or pneumoperitoneum. There is no hydronephrosis. Gallbladder stones. Diverticulosis of colon without signs of diverticulitis. Other findings as described in the body of the report. Electronically Signed   By: Elmer Picker M.D.   On: 01/11/2022 08:27   DG Abd Portable 1 View  Result Date: 01/11/2022 CLINICAL DATA:  OG tube placement. EXAM: PORTABLE ABDOMEN - 1 VIEW COMPARISON:  AP chest 01/11/2022 at 704 hours FINDINGS: New orogastric tube with side port overlying the proximal stomach and tip overlying the mid aspect of the greater curvature. Nonobstructed upper abdominal bowel-gas pattern. No portal venous gas or pneumatosis. Severe left L3-4 disc space narrowing and moderate to severe diffuse L4-5 disc space narrowing. Mild dextrocurvature  centered at L3-4. IMPRESSION: Orogastric tube in appropriate position. Electronically Signed   By: Neita Garnet M.D.   On: 01/11/2022 08:11   DG Chest Portable 1 View  Result Date: 01/11/2022 CLINICAL DATA:  Tube placement.  Encounter for intubation. EXAM: PORTABLE CHEST 1 VIEW COMPARISON:  AP chest 01/11/2022 at 704 hours FINDINGS: AP chest 01/11/2022 at 735 hours. Interval placement of endotracheal tube with tip terminating imaging of the thoracic inlet and the carina proximally 3.2 cm above the carina. Enteric tube descends below the diaphragm with the side port overlying the proximal stomach and the tip excluded by inferior collimation, new from prior. Postsurgical changes are again seen of median sternotomy and CABG. Cardiac  silhouette and mediastinal contours are within normal limits. Moderate calcification within the aortic arch. Mild left basilar linear likely subsegmental atelectasis. No definite pleural effusion. No pneumothorax is seen. No acute skeletal abnormality. IMPRESSION: 1. New endotracheal tube and enteric tube in appropriate position. 2. No acute lung process. 3.  Aortic Atherosclerosis (ICD10-I70.0). Electronically Signed   By: Neita Garnet M.D.   On: 01/11/2022 08:10   DG Chest Portable 1 View  Result Date: 01/11/2022 CLINICAL DATA:  Provided history: New onset dyspnea, recent cardiac event. EXAM: PORTABLE CHEST 1 VIEW COMPARISON:  Prior chest radiographs 01/04/2022 and earlier. FINDINGS: Prior median sternotomy/CABG. Heart size within normal limits. Aortic atherosclerosis. No appreciable airspace consolidation or pulmonary edema. No evidence of pleural effusion or pneumothorax. No acute bony abnormality identified. IMPRESSION: No evidence of acute cardiopulmonary abnormality. Aortic Atherosclerosis (ICD10-I70.0). Electronically Signed   By: Jackey Loge D.O.   On: 01/11/2022 08:03   ECHOCARDIOGRAM LIMITED  Result Date: 01/04/2022    ECHOCARDIOGRAM LIMITED REPORT   Patient Name:   BRENDY Pollard [facility] Date of Exam: 01/04/2022 Medical Rec #:  [id number]     Height:       59.0 in Accession #:    [id number]    Weight:       127.4 lb Date of Birth:  [birth year]     BSA:          1.523 m Patient Age:    81 years      BP:           128/50 mmHg Patient Gender: F             HR:           54 bpm. Exam Location:  [facility] Procedure: Limited Echo, Color Doppler and Cardiac Doppler Indications:     Cardiac Areest I46.9  History:         Patient has prior history of Echocardiogram examinations, most                  recent 12/07/2021. Previous Myocardial Infarction,                  Signs/Symptoms:Chest Pain; Risk Factors:Diabetes and                  Hypertension.  Sonographer:     Cristela Blue Referring Phys:  [id number] [place] MICHELLE Yancy Hascall  Diagnosing Phys: Arnoldo Hooker MD  Sonographer Comments: Image acquisition challenging due to patient behavioral factors. IMPRESSIONS  1. The left ventricle demonstrates regional wall motion abnormalities (see scoring diagram/findings for description).  2. Right ventricular systolic function is normal. The right ventricular size is normal.  3. Left atrial size was moderately dilated.  4. The mitral valve is normal in structure. Moderate mitral valve regurgitation.  5.  Tricuspid valve regurgitation is mild to moderate.  6. The aortic valve is normal in structure. Aortic valve regurgitation is mild. FINDINGS  Left Ventricle: The left ventricle demonstrates regional wall motion abnormalities. Moderate hypokinesis of the left ventricular, mid-apical anterolateral wall. Right Ventricle: The right ventricular size is normal. No increase in right ventricular wall thickness. Right ventricular systolic function is normal. Left Atrium: Left atrial size was moderately dilated. Right Atrium: Right atrial size was normal in size. Pericardium: There is no evidence of pericardial effusion. Mitral Valve: The mitral valve is normal in structure. Moderate mitral valve regurgitation. Tricuspid Valve: The tricuspid valve is normal in structure. Tricuspid valve regurgitation is mild to moderate. Aortic Valve: The aortic valve is normal in structure. Aortic valve regurgitation is mild. Pulmonic Valve: The pulmonic valve was normal in structure. Pulmonic valve regurgitation is trivial. Aorta: The aortic root and ascending aorta are structurally normal, with no evidence of dilitation. IAS/Shunts: No atrial level shunt detected by color flow Doppler. LEFT VENTRICLE PLAX 2D LVIDd:         4.40 cm LVIDs:         3.20 cm LV PW:         1.10 cm LV IVS:        1.00 cm LVOT diam:     2.00 cm LVOT Area:     3.14 cm  LEFT ATRIUM         Index LA diam:    3.40 cm 2.23 cm/m   AORTA Ao Root diam: 2.90 cm  SHUNTS Systemic Diam: 2.00 cm Serafina Royals MD Electronically signed by Serafina Royals MD Signature Date/Time: 01/04/2022/4:56:35 PM    Final    DG Chest 1 View  Result Date: 01/04/2022 CLINICAL DATA:  81 year old female intubated.  Post CPR. EXAM: CHEST  1 VIEW COMPARISON:  Portable chest 01/03/2022 and earlier. FINDINGS: Portable AP semi upright view at 0611 hours. Endotracheal tube tip partially obscured by sternotomy hardware, but appears to be in good position between the level the clavicles and carina. Enteric tube courses into the abdomen, tip is not included. Stable pacer or resuscitation pads over the left chest. Stable lung volumes. Mediastinal contours remain normal. Allowing for portable technique the lungs are clear. No pneumothorax or pleural effusion identified. Negative visible bowel gas. Stable visualized osseous structures. IMPRESSION: 1. Satisfactory ET tube position. Enteric tube courses to the abdomen, tip not included. 2. No acute cardiopulmonary abnormality. Electronically Signed   By: Genevie Ann M.D.   On: 01/04/2022 07:26   DG Chest Portable 1 View  Result Date: 01/03/2022 CLINICAL DATA:  Intubation, post CPR EXAM: PORTABLE CHEST 1 VIEW COMPARISON:  01/03/2022 FINDINGS: Endotracheal tube is in the right mainstem bronchus. Recommend retracting approximately 5 cm. NG tube enters the stomach. Prior CABG. Heart is normal size. Increasing airspace disease throughout the left lung which may reflect atelectasis due to right mainstem intubation. Similar right upper lobe opacity/atelectasis. No effusions. IMPRESSION: Right mainstem intubation. Recommend retracting endotracheal tube approximately 5 cm. Areas of airspace disease in the left lung and right upper lobe may be related to atelectasis from right mainstem intubation. These results were called by telephone at the time of interpretation on 01/03/2022 at 10:21 pm to provider [facility] , who verbally acknowledged these results. Electronically Signed   By: Rolm Baptise M.D.    On: 01/03/2022 22:24   CT Cervical Spine Wo Contrast  Result Date: 01/03/2022 CLINICAL DATA:  Neck trauma (Age >= 65y) EXAM: CT  CERVICAL SPINE WITHOUT CONTRAST TECHNIQUE: Multidetector CT imaging of the cervical spine was performed without intravenous contrast. Multiplanar CT image reconstructions were also generated. RADIATION DOSE REDUCTION: This exam was performed according to the departmental dose-optimization program which includes automated exposure control, adjustment of the mA and/or kV according to patient size and/or use of iterative reconstruction technique. COMPARISON:  None Available. FINDINGS: Alignment: Normal Skull base and vertebrae: No acute fracture. No primary bone lesion or focal pathologic process. Soft tissues and spinal canal: No prevertebral fluid or swelling. No visible canal hematoma. 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 Mild bilateral degenerative facet disease. Upper chest: No acute findings Other: None IMPRESSION: Degenerative disc and facet disease.  No acute bony abnormality. Electronically Signed   By: Charlett Nose M.D.   On: 01/03/2022 20:10   CT HEAD WO CONTRAST ( )  Result Date: 01/03/2022 CLINICAL DATA:  Head trauma, minor (Age >= 65y) EXAM: CT HEAD WITHOUT CONTRAST TECHNIQUE: Contiguous axial images were obtained from the base of the skull through the vertex without intravenous contrast. RADIATION DOSE REDUCTION: This exam was performed according to the departmental dose-optimization program which includes automated exposure control, adjustment of the mA and/or kV according to patient size and/or use of iterative reconstruction technique. COMPARISON:  10/03/2021 FINDINGS: Brain: No acute intracranial abnormality. Specifically, no hemorrhage, hydrocephalus, mass lesion, acute infarction, or significant intracranial injury. Vascular: No hyperdense vessel or unexpected calcification. Skull: No acute calvarial  abnormality. Sinuses/Orbits: No acute findings Other: None IMPRESSION: Normal study for patient's age. Electronically Signed   By: Charlett Nose M.D.   On: 01/03/2022 20:08   CT CHEST ABDOMEN PELVIS WO CONTRAST  Result Date: 01/03/2022 CLINICAL DATA:  Pneumonia, complication suspected, xray done. Shortness of breath. EXAM: CT CHEST, ABDOMEN AND PELVIS WITHOUT CONTRAST TECHNIQUE: Multidetector CT imaging of the chest, abdomen and pelvis was performed following the standard protocol without IV contrast. RADIATION DOSE REDUCTION: This exam was performed according to the departmental dose-optimization program which includes automated exposure control, adjustment of the mA and/or kV according to patient size and/or use of iterative reconstruction technique. COMPARISON:  None Available. FINDINGS: CT CHEST FINDINGS Cardiovascular: Heavily calcified aorta and coronary arteries. Prior CABG. Heart is normal size. Aorta is normal caliber. Mediastinum/Nodes: No mediastinal, hilar, or axillary adenopathy. Trachea and esophagus are unremarkable. Thyroid unremarkable. Lungs/Pleura: Lungs are clear. No focal airspace opacities or suspicious nodules. No effusions. Musculoskeletal: Chest wall soft tissues are unremarkable. No acute bony abnormality. CT ABDOMEN PELVIS FINDINGS Hepatobiliary: Small gallstone within the gallbladder. No focal hepatic abnormality. Pancreas: No focal abnormality or ductal dilatation. Spleen: No focal abnormality.  Normal size. Adrenals/Urinary Tract: Left kidney is atrophic. No renal or adrenal mass. No stones or hydronephrosis. Urinary bladder unremarkable. Stomach/Bowel: Left colonic diverticulosis. No active diverticulitis. Stomach and small bowel decompressed, unremarkable. Vascular/Lymphatic: Heavily calcified aorta. No evidence of aneurysm or adenopathy. Reproductive: Calcified fibroids in the uterus.  No adnexal masses. Other: No free fluid or free air. Musculoskeletal: No acute bony abnormality.  Scoliosis and degenerative changes in the lumbar spine. IMPRESSION: No acute cardiopulmonary disease. Diffuse aortoiliac atherosclerosis.  No aneurysm. Cholelithiasis. Left colonic diverticulosis.  No active diverticulitis. No acute findings in the abdomen or pelvis. Electronically Signed   By: Charlett Nose M.D.   On: 01/03/2022 20:05   DG Chest 2 View  Result Date: 01/03/2022 CLINICAL DATA:  Shortness of breath EXAM: CHEST - 2 VIEW COMPARISON:  12/06/2021 FINDINGS: Prior CABG. Heart and  mediastinal contours are within normal limits. No focal opacities or effusions. No acute bony abnormality. IMPRESSION: No active cardiopulmonary disease. Electronically Signed   By: Rolm Baptise M.D.   On: 01/03/2022 19:25    ECHO 01/04/2022  1. The left ventricle demonstrates regional wall motion abnormalities  (see scoring diagram/findings for description).   2. Right ventricular systolic function is normal. The right ventricular  size is normal.   3. Left atrial size was moderately dilated.   4. The mitral valve is normal in structure. Moderate mitral valve  regurgitation.   5. Tricuspid valve regurgitation is mild to moderate.   6. The aortic valve is normal in structure. Aortic valve regurgitation is  mild.   FINDINGS   Left Ventricle: The left ventricle demonstrates regional wall motion  abnormalities. Moderate hypokinesis of the left ventricular, mid-apical  anterolateral wall.   Right Ventricle: The right ventricular size is normal. No increase in  right ventricular wall thickness. Right ventricular systolic function is  normal.   Left Atrium: Left atrial size was moderately dilated.   Right Atrium: Right atrial size was normal in size.   Pericardium: There is no evidence of pericardial effusion.   Mitral Valve: The mitral valve is normal in structure. Moderate mitral  valve regurgitation.   Tricuspid Valve: The tricuspid valve is normal in structure. Tricuspid  valve regurgitation is  mild to moderate.   Aortic Valve: The aortic valve is normal in structure. Aortic valve  regurgitation is mild.   Pulmonic Valve: The pulmonic valve was normal in structure. Pulmonic valve  regurgitation is trivial.   Aorta: The aortic root and ascending aorta are structurally normal, with  no evidence of dilitation.   IAS/Shunts: No atrial level shunt detected by color flow Doppler.   12/07/2021  1. Left ventricular ejection fraction, by estimation, is 55 to 60%. The  left ventricle has normal function. The left ventricle has no regional  wall motion abnormalities. Left ventricular diastolic parameters are  consistent with Grade I diastolic  dysfunction (impaired relaxation).   2. Right ventricular systolic function is normal. The right ventricular  size is normal.   3. The mitral valve is normal in structure. Moderate mitral valve  regurgitation.   4. The aortic valve is normal in structure. Aortic valve regurgitation is  mild.   FINDINGS   Left Ventricle: Left ventricular ejection fraction, by estimation, is 55  to 60%. The left ventricle has normal function. The left ventricle has no  regional wall motion abnormalities. The left ventricular internal cavity  size was normal in size. There is   no left ventricular hypertrophy. Left ventricular diastolic parameters  are consistent with Grade I diastolic dysfunction (impaired relaxation).   Right Ventricle: The right ventricular size is normal. No increase in  right ventricular wall thickness. Right ventricular systolic function is  normal.   Left Atrium: Left atrial size was normal in size.   Right Atrium: Right atrial size was normal in size.   Pericardium: There is no evidence of pericardial effusion.   Mitral Valve: The mitral valve is normal in structure. Moderate mitral  valve regurgitation. MV peak gradient, 4.8 mmHg. The mean mitral valve  gradient is 1.0 mmHg.   Tricuspid Valve: The tricuspid valve is grossly  normal. Tricuspid valve  regurgitation is mild.   Aortic Valve: The aortic valve is normal in structure. Aortic valve  regurgitation is mild. Aortic regurgitation PHT measures 567 msec. Aortic  valve mean gradient measures 10.0 mmHg. Aortic  valve peak gradient  measures 16.9 mmHg. Aortic valve area, by VTI   measures 1.12 cm.   Pulmonic Valve: The pulmonic valve was normal in structure. Pulmonic valve  regurgitation is not visualized.   Aorta: The ascending aorta was not well visualized.   IAS/Shunts: No atrial level shunt detected by color flow Doppler.   TELEMETRY reviewed by me: sinus rhythm rate 70s, multiple short runs of NSVT this morning.  EKG reviewed by me: sinus bradycardia 57, lateral ST depressions V5-6, improving on repeat. LBBB  ASSESSMENT AND PLAN:  Lindsey Pollard is an 77yoF with a PMH of CAD s/p CABG x2 in 2018 (patent LIMA to LAD,  occluded SVG to OM1 by [facility] 05/2021), h/o PCI x1 distal RCA (50% ISR by [facility] 05/2021), ischemic CM with LVEF with moderate hypo of LV mid apical anterolateral wall, 55-60%, g1dd, mod MR 12/2021, CKD 3, hyperlipidemia, type 2 diabetes, CAD, GERD who was recently hospitalized at [facility] from 7/12 - 7/18 initially for shortness of breath and developed chest pain, VF arrest with successful resuscitation and her NSTEMI was treated medically.  She presents to [facility] ED 2 days after discharge with acute encephalopathy, nausea, vomiting, and was intubated for airway protection in the ED. cardiology was consulted due to her significant cardiac history and concern for NSTEMI. Successfully extubated 7/26 to 3L   #Acute encephalopathy #recent NSTEMI 01/04/2022 - h/o significant CAD s/p CABG x2 with known occluded SVG to OM1, LIMA to LAD patent by [facility] 05/2021 #Ischemic cardiomyopathy #Acute hypoxic respiratory failure Patient has a complicated and significant history of CAD with multiple admissions over the past year for chest pain and has been medically managed due  to patient preference.  She was most recently hospitalized last week with chest pain, VF arrest with ROSC requiring intubation.  Initial EKG showed significant lateral ST depressions and echo showed a new LV mid apical anterolateral hypokinesis.  Her NSTEMI was ultimately treated medically.  She presents again 2 days after discharge with confusion and acting strangely with multiple episodes of emesis.  Her troponin is downtrending from last admission at 2206-1700 (compared to a peak of 6300 previously). Her EKG shows lateral ST depressions and LBBB, improving on repeats. UA is positive for leukocytes she is being treated for possible aspiration pneumonia.  -successfully extubated 7/25 and has been off vasopressor support  -Agree with current therapy per primary team -s/p 325 mg aspirin, continue DAPT with aspirin 81 mg and 75 mg clopidogrel daily. -S/p heparin drip for 48 hours, ended 7/22 -continue nitropaste 0.5inch q6h  -Continue atorvastatin 80 mg daily -s/p lasix 40mg  IV x1 yesterday, remains net positive: give another 40mg  IV lasix BID today  -Restart low dose metoprolol XL 12.5mg , hold isosorbide 30 mg daily. -Hold torsemide 20 mg, spironolactone 12.5 mg, lisinopril 20 mg -Limited echo resulted with an EF of 45 to 50% with global hypokinesis, LV dilation  This patient's plan of care was discussed and created with Dr. Saralyn Pilar and he is in agreement.  Signed: Tristan Schroeder , PA-C 01/17/2022, 8:14 AM [facility]

## 2022-01-17 NOTE — Progress Notes (Addendum)
PROGRESS NOTE    Lindsey Pollard  [id number] DOB: Nov 14, 1940  DOA: 01/11/2022 Date of Service: 01/17/22 PCP: [name], MD     Brief Narrative / Hospital Course:  81 year old female with a past medical history significant for recent V-fib arrest, HFpEF, ischemic cardiomyopathy, CAD status post CABG x2 in 2018 and PCI, chronic kidney disease stage III, diabetes mellitus who presents to [facility] ED on 01/11/2022 due to altered mental status, respiratory distress, and nausea/vomiting. (Of note: Was recently admitted from 7/12 through 7/18 for brief cardiac arrest in the setting of NSTEMI and acute CHF, along with acute kidney injury, discharged home with PT/OT and cardiac rehab but continued to be weak, very sleepy (more than normal), with poor p.o. intake.) 07/20: Intubated in ED, cardiology consulted for NSTEMI, admitted to PCCM. 07/21: Multiorgan failure, requiring pressors, poor prognosis 07/22, 07/23: DNR. Failed weaning trials 07/24: failed SAT/SBT d/t ischemic cardiomyopathy  07/25: No further interventions per cardiology. GOC discussion w/ Dr Mortimer Fries (ICU), remains DNR, agreed to extubation, increased WOB and trial of BiPap but pt did not tolerate this. Plan transfer to [facility] tomorrow.  07/26: TRH pickup from ICU. O2 requirement lower, will transfer out of SDU. PT/OT pending will likely need SNF/rehab. Temp increasing, repeat BCx, CXR, UA, resp PCR, remove foley, may need to restart abx.    Consultants:  Cardiology PCCU (initially admitted to ICU)  Procedures: Intubation 07/20, extubation 07/25 Central line placement 07/20    Subjective: Patient reports feeling short winded but no severe SOB, no chest pain, no nausea. Family at bedside voice no concerns.      ASSESSMENT & PLAN:   Principal Problem:   NSTEMI (non-ST elevated myocardial infarction) ([place]) Active Problems:   Unstable angina (HCC)   Acute respiratory failure with hypoxia (HCC)   AKI (acute kidney  injury) ([place]) in the setting of stage IIIa chronic   S/P CABG x 2   Malnutrition of moderate degree   Fever and chills   NSTEMI (non-ST elevated myocardial infarction) ([place]) Unstable angina CAD s/p CABG Ischemic cardiomyopathy HTN Managed medically Cardiology following - no further invasive procedures planned  AKI (acute kidney injury) ([place]) in the setting of stage IIIa chronic Improving  Monitor serial BMP  Acute respiratory failure with hypoxia (HCC) Acute encephalopathy Improved, off ventilator   Fever and chills Temp increasing, repeat BCx, CXR, UA, resp PCR, remove foley, may need to restart abx.      DVT prophylaxis: heparin Code Status: DNR Family Communication: family at bedside on rounds  Disposition Plan / TOC needs: PT/OT pending will likely need SNF Barriers to discharge / significant pending items: continued monitoring post-extubation, will likely need SNFplacement, hope to D/C by end of the week              Objective: Vitals:   01/17/22 1200 01/17/22 1300 01/17/22 1400 01/17/22 1437  BP: (!) 152/64 (!) 145/57 (!) 141/52   Pulse: (!) 102 96  92  Resp: (!) 22 (!) 24 18 18   Temp: 99.7 F (37.6 C) 99.7 F (37.6 C) 99.9 F (37.7 C) 100.2 F (37.9 C)  TempSrc:      SpO2: 92% 100%  100%  Weight:      Height:        Intake/Output Summary (Last 24 hours) at 01/17/2022 1706 Last data filed at 01/17/2022 1151 Gross per 24 hour  Intake 240 ml  Output 1651 ml  Net -1411 ml   Filed Weights   01/15/22 0500  01/16/22 0451 01/17/22 0500  Weight: 56.6 kg 60.1 kg 60 kg    Examination:  Constitutional:  VS as above General Appearance: alert, frail but not cachectic, NAD Eyes: Normal lids and conjunctive, non-icteric sclera Ears, Nose, Mouth, Throat: Normal appearance externally Neck: No masses, trachea midline Respiratory: Increased respiratory rate, no accessory muscle use  Breath sounds normal, no wheeze/rhonchi/rales but poor  inspiratory effort  Cardiovascular: S1/S2 normal, no murmur/rub/gallop auscultated No lower extremity edema Gastrointestinal: Nontender, no masses Musculoskeletal:  No clubbing/cyanosis of digits Neurological: No cranial nerve deficit on limited exam Psychiatric: Normal judgment/insight Anxious mood and affect       Scheduled Medications:   aspirin EC  81 mg Oral Daily   Chlorhexidine Gluconate Cloth  6 each Topical Daily   clopidogrel  75 mg Oral Daily   feeding supplement  237 mL Oral TID BM   furosemide  40 mg Intravenous BID   heparin injection (subcutaneous)  5,000 Units Subcutaneous Q8H   insulin aspart  0-15 Units Subcutaneous Q4H   metoprolol succinate  12.5 mg Oral Daily   [START ON 01/18/2022] multivitamin with minerals  1 tablet Oral Daily   pantoprazole  40 mg Oral Daily    Continuous Infusions:  sodium chloride Stopped (01/15/22 0502)    PRN Medications:  acetaminophen, docusate sodium, ipratropium-albuterol, loperamide, morphine injection, nitroGLYCERIN, ondansetron (ZOFRAN) IV, mouth rinse, polyethylene glycol  Antimicrobials:  Anti-infectives (From admission, onward)    Start     Dose/Rate Route Frequency Ordered Stop   01/11/22 1200  piperacillin-tazobactam (ZOSYN) IVPB 3.375 g        3.375 g 12.5 mL/hr over 240 Minutes Intravenous Every 8 hours 01/11/22 1106 01/16/22 0149   01/11/22 0915  cefTRIAXone (ROCEPHIN) 1 g in sodium chloride 0.9 % 100 mL IVPB  Status:  Discontinued        1 g 200 mL/hr over 30 Minutes Intravenous Every 24 hours 01/11/22 0903 01/11/22 1106       Data Reviewed: I have personally reviewed following labs and imaging studies  CBC: Recent Labs  Lab 01/11/22 0719 01/12/22 0415 01/13/22 0339 01/14/22 0412 01/15/22 0456 01/17/22 1153  WBC 5.7 12.0* 9.4 8.3 9.7 13.2*  NEUTROABS 3.7  --   --   --  7.9*  --   HGB 8.9* 8.6* 7.7* 7.4* 7.2* 8.1*  HCT 27.8* 26.7* 24.2* 23.7* 23.1* 26.3*  MCV 96.2 96.0 97.6 97.5 98.3 100.8*   PLT 364 411* 312 281 264 [id number]   Basic Metabolic Panel: Recent Labs  Lab 01/11/22 0713 01/11/22 0719 01/12/22 0415 01/13/22 0339 01/14/22 0412 01/15/22 0456 01/16/22 0443 01/17/22 0330  NA  --    < > 135 134* 136 138 141 139  K  --    < > 3.1* 3.6 4.3 3.7 3.1* 3.7  CL  --    < > 100 103 102 102 104 103  CO2  --    < > 28 25 27 28 27 27   GLUCOSE  --    < > 158* 198* 165* 188* 168* 98  BUN  --    < > 46* 41* 35* 37* 48* 49*  CREATININE  --    < > 1.75* 1.42* 1.16* 1.31* 1.36* 1.20*  CALCIUM  --    < > 8.4* 7.9* 8.3* 7.9* 8.3* 8.5*  MG 2.4  --  2.1 2.3 2.4 2.2  --   --   PHOS  --   --  4.2 3.6 3.6 3.9  --   --    < > =  values in this interval not displayed.   GFR: Estimated Creatinine Clearance: 29.5 mL/min (A) (by C-G formula based on SCr of 1.2 mg/dL (H)). Liver Function Tests: Recent Labs  Lab 01/11/22 0719 01/15/22 0456  AST 28 16  ALT 33 17  ALKPHOS 66 65  BILITOT 0.7 0.7  PROT 6.5 5.8*  ALBUMIN 3.3* 2.6*   No results for input(s): "LIPASE", "AMYLASE" in the last 168 hours. No results for input(s): "AMMONIA" in the last 168 hours. Coagulation Profile: Recent Labs  Lab 01/11/22 0719 01/11/22 0840  INR 1.0 1.1   Cardiac Enzymes: No results for input(s): "CKTOTAL", "CKMB", "CKMBINDEX", "TROPONINI" in the last 168 hours. BNP (last 3 results) No results for input(s): "PROBNP" in the last 8760 hours. HbA1C: No results for input(s): "HGBA1C" in the last 72 hours. CBG: Recent Labs  Lab 01/16/22 2338 01/17/22 0335 01/17/22 0738 01/17/22 1129 01/17/22 1631  GLUCAP 126* 97 99 102* 192*   Lipid Profile: Recent Labs    01/16/22 0443  TRIG 121   Thyroid Function Tests: No results for input(s): "TSH", "T4TOTAL", "FREET4", "T3FREE", "THYROIDAB" in the last 72 hours. Anemia Panel: No results for input(s): "VITAMINB12", "FOLATE", "FERRITIN", "TIBC", "IRON", "RETICCTPCT" in the last 72 hours. Urine analysis:    Component Value Date/Time   COLORURINE YELLOW  (A) 01/11/2022 0733   APPEARANCEUR CLOUDY (A) 01/11/2022 0733   LABSPEC 1.020 01/11/2022 0733   PHURINE 5.0 01/11/2022 0733   GLUCOSEU NEGATIVE 01/11/2022 0733   HGBUR NEGATIVE 01/11/2022 0733   BILIRUBINUR NEGATIVE 01/11/2022 0733   KETONESUR NEGATIVE 01/11/2022 0733   PROTEINUR NEGATIVE 01/11/2022 0733   NITRITE NEGATIVE 01/11/2022 0733   LEUKOCYTESUR LARGE (A) 01/11/2022 0733   Sepsis Labs: @LABRCNTIP (procalcitonin:4,lacticidven:4)  Recent Results (from the past 240 hour(s))  Urine Culture     Status: Abnormal   Collection Time: 01/11/22  7:33 AM   Specimen: Urine, Random  Result Value Ref Range Status   Specimen Description   Final    URINE, RANDOM Performed at [facility], [address] [place], [place]    Special Requests   Final    NONE Performed at [facility], [address] [place], [place]    Culture >=100,000 COLONIES/mL ESCHERICHIA COLI (A)  Final   Report Status 01/13/2022 FINAL  Final   Organism ID, Bacteria ESCHERICHIA COLI (A)  Final      Susceptibility   Escherichia coli - MIC*    AMPICILLIN <=2 SENSITIVE Sensitive     CEFAZOLIN <=4 SENSITIVE Sensitive     CEFEPIME <=0.12 SENSITIVE Sensitive     CEFTRIAXONE <=0.25 SENSITIVE Sensitive     CIPROFLOXACIN <=0.25 SENSITIVE Sensitive     GENTAMICIN <=1 SENSITIVE Sensitive     IMIPENEM <=0.25 SENSITIVE Sensitive     NITROFURANTOIN <=16 SENSITIVE Sensitive     TRIMETH/SULFA <=20 SENSITIVE Sensitive     AMPICILLIN/SULBACTAM <=2 SENSITIVE Sensitive     PIP/TAZO <=4 SENSITIVE Sensitive     * >=100,000 COLONIES/mL ESCHERICHIA COLI  SARS Coronavirus 2 by RT PCR (hospital order, performed in [facility] hospital lab) *cepheid single result test* Nasal Mucosa     Status: None   Collection Time: 01/11/22 11:00 AM   Specimen: Nasal Mucosa; Nasal Swab  Result Value Ref Range Status   SARS Coronavirus 2 by RT PCR NEGATIVE NEGATIVE Final    Comment: (NOTE) SARS-CoV-2 target  nucleic acids are NOT DETECTED.  The SARS-CoV-2 RNA is generally detectable in upper and lower respiratory specimens during  the acute phase of infection. The lowest concentration of SARS-CoV-2 viral copies this assay can detect is 250 copies / mL. A negative result does not preclude SARS-CoV-2 infection and should not be used as the sole basis for treatment or other patient management decisions.  A negative result may occur with improper specimen collection / handling, submission of specimen other than nasopharyngeal swab, presence of viral mutation(s) within the areas targeted by this assay, and inadequate number of viral copies (<250 copies / mL). A negative result must be combined with clinical observations, patient history, and epidemiological information.  Fact Sheet for Patients:   RoadLapTop.co.za  Fact Sheet for Healthcare Providers: http://kim-miller.com/  This test is not yet approved or  cleared by the [place] FDA and has been authorized for detection and/or diagnosis of SARS-CoV-2 by FDA under an Emergency Use Authorization (EUA).  This EUA will remain in effect (meaning this test can be used) for the duration of the COVID-19 declaration under Section 564(b)(1) of the Act, 21 U.S.C. section 360bbb-3(b)(1), unless the authorization is terminated or revoked sooner.  Performed at [facility], [address] [place], [place] [address]   MRSA Next Gen by PCR, Nasal     Status: None   Collection Time: 01/11/22 11:00 AM   Specimen: Nasal Mucosa; Nasal Swab  Result Value Ref Range Status   MRSA by PCR Next Gen NOT DETECTED NOT DETECTED Final    Comment: (NOTE) The GeneXpert MRSA Assay (FDA approved for NASAL specimens only), is one component of a comprehensive MRSA colonization surveillance program. It is not intended to diagnose MRSA infection nor to guide or monitor treatment for MRSA infections. Test  performance is not FDA approved in patients less than 25 years old. Performed at [facility], [address] [place], [place] [address]   Culture, Respiratory w Gram Stain     Status: None   Collection Time: 01/11/22  2:05 PM   Specimen: Tracheal Aspirate; Respiratory  Result Value Ref Range Status   Specimen Description   Final    TRACHEAL ASPIRATE Performed at [facility], [address] [place], [place] [address]    Special Requests   Final    NONE Performed at [facility], [address] [place], [place] [address]    Gram Stain   Final    FEW WBC PRESENT,BOTH PMN AND MONONUCLEAR FEW GRAM POSITIVE COCCI IN PAIRS    Culture   Final    RARE Normal respiratory flora-no Staph aureus or Pseudomonas seen Performed at [facility], [address] [place], [place] [address]    Report Status 01/14/2022 FINAL  Final         Radiology Studies last 96 hours: DG Chest Port 1 View  Result Date: 01/15/2022 CLINICAL DATA:  Respiratory failure EXAM: PORTABLE CHEST 1 VIEW COMPARISON:  Prior chest x-ray 11/07/2041 FINDINGS: Endotracheal tube present. The tip is 4.7 cm above the carina. Left IJ approach central venous catheter with the catheter tip at the cavoatrial junction. A gastric tube is present. The proximal side-hole is visualized over the gastric bubble. Patient is status post median sternotomy with evidence of multivessel CABG. Sternal fixation hardware in place. Cardiac and mediastinal contours are within normal limits. Increased pulmonary vascular congestion now with mild interstitial edema. Probable small bilateral pleural effusions with associated bibasilar atelectasis. No pneumothorax. No acute osseous abnormality. IMPRESSION: 1. Stable and satisfactory support apparatus. 2. Increased pulmonary vascular congestion now with mild interstitial edema.  3. Small bilateral layering pleural effusions. Electronically Signed   By: Jacqulynn Cadet  M.D.   On: 01/15/2022 10:15            LOS: 6 days       Emeterio Reeve, DO [facility] 01/17/2022, 5:06 PM   Staff may message me via secure chat in [facility]  but this may not receive immediate response,  please page for urgent matters!  If 7PM-7AM, please contact night-coverage [email or web address]  Dictation software was used to generate the above note. Typos may occur and escape review, as with typed/written notes. Please contact Dr Sheppard Coil directly for clarity if needed.

## 2022-01-17 NOTE — Assessment & Plan Note (Addendum)
Acute encephalopathy RESOLVED, off ventilator   Continue supplemental O2 as needed  SOB likely will be chronic d/t CAD/anxiety   Lowered dose alprazolam

## 2022-01-17 NOTE — Progress Notes (Signed)
Nutrition Follow Up Note   DOCUMENTATION CODES:   Non-severe (moderate) malnutrition in context of chronic illness  INTERVENTION:   Ensure Enlive po TID, each supplement provides 350 kcal and 20 grams of protein.  MVI po daily   Pt at high refeed risk; recommend monitor potassium, magnesium and phosphorus labs daily until stable  NUTRITION DIAGNOSIS:   Moderate Malnutrition related to chronic illness as evidenced by moderate muscle depletion, moderate fat depletion.  GOAL:   Patient will meet greater than or equal to 90% of their needs -progressing   MONITOR:   PO intake, Supplement acceptance, Labs, Weight trends, Skin, I & O's  ASSESSMENT:   81 y/o female with h/o CAD s/p CABG x 2, PVD, GERD, CKD III, MI, DM, CHF, HTN, HLD and recent admission for VFib cardiac arrest and who is now admitted with acute metabolic encephalopathy, acute respiratory failure in the setting of acute on chronic HFpEF, elevated troponins (NSTEMI versus demand ischemia), UTI and presumed aspiration requiring intubation for airway protection.  Pt initiated on a regular diet yesterday. Ensure added by MD. Sherron Monday with RN, pt eating only bites of meals and drinking only sips of Ensure. RD will monitor pt's oral intake. Pt may require NGT and nutrition support if her oral intake does not improve. Pt remains at high refeed risk. Per chart, pt is down 8lbs since admit; pt appears to be up ~11lbs from her UBW. Pt +2.7L on her I & Os.   Medications reviewed and include: aspirin, plavix, lasix, heparin, insulin, protonix  Labs reviewed: K 3.7 wnl, BUN 49(H), creat 1.20(H) P 3.9 wnl, Mg 2.2 wnl- 7/24 Wbc- 13.2(H), Hgb 8.1(L), Hct 26.3(L) Cbgs- 102, 99, 97 x 24 hrs  Diet Order:   Diet Order             Diet regular Room service appropriate? Yes; Fluid consistency: Thin  Diet effective now                  EDUCATION NEEDS:   No education needs have been identified at this time  Skin:  Skin  Assessment: Reviewed RN Assessment (ecchymosis)  Last BM:  7/26- type 7  Height:   Ht Readings from Last 1 Encounters:  01/15/22 4' 11.02" (1.499 m)    Weight:   Wt Readings from Last 1 Encounters:  01/17/22 60 kg    Ideal Body Weight:  44.5 kg  BMI:  Body mass index is 26.7 kg/m.  Estimated Nutritional Needs:   Kcal:  1500-1700kcal/day  Protein:  75-85g/day  Fluid:  1.2-1.4L/day  Betsey Holiday MS, RD, LDN Please refer to [facility] for RD and/or RD on-call/weekend/after hours pager

## 2022-01-17 NOTE — Assessment & Plan Note (Addendum)
Improving   Monitor serial BMP

## 2022-01-18 DIAGNOSIS — I214 Non-ST elevation (NSTEMI) myocardial infarction: Secondary | ICD-10-CM | POA: Diagnosis not present

## 2022-01-18 LAB — COMPREHENSIVE METABOLIC PANEL
ALT: 16 U/L (ref 0–44)
AST: 21 U/L (ref 15–41)
Albumin: 2.9 g/dL — ABNORMAL LOW (ref 3.5–5.0)
Alkaline Phosphatase: 56 U/L (ref 38–126)
Anion gap: 12 (ref 5–15)
BUN: 52 mg/dL — ABNORMAL HIGH (ref 8–23)
CO2: 24 mmol/L (ref 22–32)
Calcium: 8.5 mg/dL — ABNORMAL LOW (ref 8.9–10.3)
Chloride: 99 mmol/L (ref 98–111)
Creatinine, Ser: 1.06 mg/dL — ABNORMAL HIGH (ref 0.44–1.00)
GFR, Estimated: 53 mL/min — ABNORMAL LOW (ref >60)
Glucose, Bld: 100 mg/dL — ABNORMAL HIGH (ref 70–99)
Potassium: 4.2 mmol/L (ref 3.5–5.1)
Sodium: 135 mmol/L (ref 135–145)
Total Bilirubin: 0.8 mg/dL (ref 0.3–1.2)
Total Protein: 6.1 g/dL — ABNORMAL LOW (ref 6.5–8.1)

## 2022-01-18 LAB — GLUCOSE, CAPILLARY
Glucose-Capillary: 109 mg/dL — ABNORMAL HIGH (ref 70–99)
Glucose-Capillary: 113 mg/dL — ABNORMAL HIGH (ref 70–99)
Glucose-Capillary: 116 mg/dL — ABNORMAL HIGH (ref 70–99)
Glucose-Capillary: 117 mg/dL — ABNORMAL HIGH (ref 70–99)
Glucose-Capillary: 126 mg/dL — ABNORMAL HIGH (ref 70–99)
Glucose-Capillary: 162 mg/dL — ABNORMAL HIGH (ref 70–99)

## 2022-01-18 LAB — CBC WITH DIFFERENTIAL/PLATELET
Abs Immature Granulocytes: 0.08 10*3/uL — ABNORMAL HIGH (ref 0.00–0.07)
Basophils Absolute: 0.1 10*3/uL (ref 0.0–0.1)
Basophils Relative: 1 %
Eosinophils Absolute: 0.2 10*3/uL (ref 0.0–0.5)
Eosinophils Relative: 2 %
HCT: 26.4 % — ABNORMAL LOW (ref 36.0–46.0)
Hemoglobin: 8.2 g/dL — ABNORMAL LOW (ref 12.0–15.0)
Immature Granulocytes: 1 %
Lymphocytes Relative: 16 %
Lymphs Abs: 1.8 10*3/uL (ref 0.7–4.0)
MCH: 30 pg (ref 26.0–34.0)
MCHC: 31.1 g/dL (ref 30.0–36.0)
MCV: 96.7 fL (ref 80.0–100.0)
Monocytes Absolute: 1.2 10*3/uL — ABNORMAL HIGH (ref 0.1–1.0)
Monocytes Relative: 10 %
Neutro Abs: 8.3 10*3/uL — ABNORMAL HIGH (ref 1.7–7.7)
Neutrophils Relative %: 70 %
Platelets: 351 10*3/uL (ref 150–400)
RBC: 2.73 MIL/uL — ABNORMAL LOW (ref 3.87–5.11)
RDW: 14.7 % (ref 11.5–15.5)
WBC: 11.7 10*3/uL — ABNORMAL HIGH (ref 4.0–10.5)
nRBC: 0 % (ref 0.0–0.2)

## 2022-01-18 LAB — URINE CULTURE: Culture: NO GROWTH

## 2022-01-18 MED ORDER — DM-GUAIFENESIN ER 30-600 MG PO TB12
1.0000 | ORAL_TABLET | Freq: Two times a day (BID) | ORAL | Status: DC
Start: 2022-01-18 — End: 2022-01-23
  Administered 2022-01-18 – 2022-01-23 (×11): 1 via ORAL
  Filled 2022-01-18 (×11): qty 1

## 2022-01-18 MED ORDER — FUROSEMIDE 10 MG/ML IJ SOLN
40.0000 mg | Freq: Two times a day (BID) | INTRAMUSCULAR | Status: DC
Start: 2022-01-18 — End: 2022-01-18
  Administered 2022-01-18: 40 mg via INTRAVENOUS
  Filled 2022-01-18: qty 4

## 2022-01-18 MED ORDER — ENOXAPARIN SODIUM 40 MG/0.4ML IJ SOSY
40.0000 mg | PREFILLED_SYRINGE | INTRAMUSCULAR | Status: DC
  Administered 2022-01-18 – 2022-01-22 (×5): 40 mg via SUBCUTANEOUS
  Filled 2022-01-18 (×5): qty 0.4

## 2022-01-18 MED ORDER — ATORVASTATIN CALCIUM 20 MG PO TABS
80.0000 mg | ORAL_TABLET | Freq: Every day | ORAL | Status: DC
  Administered 2022-01-18 – 2022-01-22 (×5): 80 mg via ORAL
  Filled 2022-01-18 (×5): qty 4

## 2022-01-18 MED ORDER — FUROSEMIDE 10 MG/ML IJ SOLN
40.0000 mg | Freq: Four times a day (QID) | INTRAMUSCULAR | Status: AC
  Administered 2022-01-18: 40 mg via INTRAVENOUS
  Filled 2022-01-18 (×2): qty 4

## 2022-01-18 MED ORDER — ISOSORBIDE MONONITRATE ER 30 MG PO TB24
15.0000 mg | ORAL_TABLET | Freq: Every day | ORAL | Status: DC
  Administered 2022-01-18 – 2022-01-20 (×3): 15 mg via ORAL
  Filled 2022-01-18 (×3): qty 1

## 2022-01-18 NOTE — Progress Notes (Signed)
Called to room with patient having complaints of chest pain. 9/10 scale. PRN nitro paste 0.5" provided to chest as per order. Patient in bed at this time. Continues to be drowsy but remains oriented. Daughter [name] at bedside. Patient also provided with new order of Mucinex placed for cough. Lasix held at time of ordered administration as Nitro paste was given and do not want patient to potentially become hypotensive. Will notify cardiology and give when appropriate.   MD Lyn Hollingshead made aware by this RN of new onset cough and chest pain. Will notify MD if chest pain is unrelieved and obtain 12 lead EKG.   At time of this RN leaving patient room, patient states chest pain is beginning to diminish. Will continue to closely monitor.

## 2022-01-18 NOTE — Evaluation (Signed)
Occupational Therapy Evaluation Patient Details Name: Lindsey Pollard MRN: [id number] DOB: 1940/12/13 Today's Date: 01/18/2022   History of Present Illness 81 year old female who presents to [facility] ED on 01/11/2022 due to altered mental status, respiratory distress, and nausea/vomiting. Pt intubated 7/20-25, She was recently admitted 7/12 - 18 for NSTEMI and acute CHF, along with acute kidney injury, discharged home with PT/OT and cardiac rehab but continued to be weakwith a past medical history significant for recent V-fib arrest, HFpEF, ischemic cardiomyopathy, CAD status post CABG x2 in 2018 and PCI, chronic kidney disease stage III, diabetes mellitus.   Clinical Impression   Lindsey Pollard was seen for OT evaluation this date. Prior to hospital admission, pt was MOD I for mobility using SPC. Pt lives with her best friend in home c level entry. Pt presents to acute OT demonstrating impaired ADL performance and functional mobility 2/2 decreased activity tolerance and functional strength/ROM/balance deficits. Pt currently requires MIN A exit bed. MAX A don B socks and hair brushing in sitting - pt requires BUE support for static sitting balance. MOD A + RW sit<>stand, pt endorses increased dizziness in sitting and standing, does not tolerate standing BP reading. Pt would benefit from skilled OT to address noted impairments and functional limitations (see below for any additional details). Upon hospital discharge, recommend STR to maximize pt safety and return to PLOF.    Recommendations for follow up therapy are one component of a multi-disciplinary discharge planning process, led by the attending physician.  Recommendations may be updated based on patient status, additional functional criteria and insurance authorization.   Follow Up Recommendations  Skilled nursing-short term rehab (<3 hours/day)    Assistance Recommended at Discharge Intermittent Supervision/Assistance  Patient can return home with the  following A little help with walking and/or transfers;A little help with bathing/dressing/bathroom;Help with stairs or ramp for entrance    Functional Status Assessment  Patient has had a recent decline in their functional status and demonstrates the ability to make significant improvements in function in a reasonable and predictable amount of time.  Equipment Recommendations  Other (comment) (defer to next venue of care)    Recommendations for Other Services       Precautions / Restrictions Precautions Precautions: Fall Restrictions Weight Bearing Restrictions: No      Mobility Bed Mobility Overal bed mobility: Needs Assistance Bed Mobility: Supine to Sit, Sit to Supine     Supine to sit: Min assist, HOB elevated Sit to supine: Mod assist        Transfers Overall transfer level: Needs assistance Equipment used: Rolling walker (2 wheels) Transfers: Sit to/from Stand Sit to Stand: Mod assist, +2 safety/equipment           General transfer comment: tolerated ~10 sec standing prior to requesting to sit      Balance Overall balance assessment: Needs assistance Sitting-balance support: Bilateral upper extremity supported Sitting balance-Leahy Scale: Fair     Standing balance support: Bilateral upper extremity supported, During functional activity Standing balance-Leahy Scale: Poor                             ADL either performed or assessed with clinical judgement   ADL Overall ADL's : Needs assistance/impaired  General ADL Comments: MAX A don B socks in sitting. MAX A hair brushing in sitting - pt requires BUE support for static sitting balance.      Pertinent Vitals/Pain Pain Assessment Pain Assessment: No/denies pain     Hand Dominance     Extremity/Trunk Assessment Upper Extremity Assessment Upper Extremity Assessment: Generalized weakness   Lower Extremity Assessment Lower  Extremity Assessment: Generalized weakness       Communication Communication Communication: HOH   Cognition Arousal/Alertness: Awake/alert Behavior During Therapy: WFL for tasks assessed/performed, Flat affect Overall Cognitive Status: Within Functional Limits for tasks assessed                                 General Comments: agreeable however reports fatigue.     General Comments  SEATED: BP 100/66, MAP 71, HR 92, SpO2 100% on 2L [place]            Home Living Family/patient expects to be discharged to:: Private residence Living Arrangements: Non-relatives/Friends Available Help at Discharge: Friend(s);Available PRN/intermittently;Family Type of Home: House Home Access: Level entry     Home Layout: One level     Bathroom Shower/Tub: Teacher, early years/pre: Standard     Home Equipment: Cane - single point;Grab bars - toilet;Shower seat   Additional Comments: Lives with friend who recently had a stroke. Uses uber/lyft to go get to/from grocery store. Daughter hoping pt will come live with her      Prior Functioning/Environment Prior Level of Function : Independent/Modified Independent             Mobility Comments: until a few weeks ago apparently only rarely needed AD but not out of the home much          OT Problem List: Decreased strength;Decreased activity tolerance;Decreased cognition;Impaired balance (sitting and/or standing)      OT Treatment/Interventions: Self-care/ADL training;Patient/family education;Therapeutic exercise;Balance training;Energy conservation;Therapeutic activities;DME and/or AE instruction    OT Goals(Current goals can be found in the care plan section) Acute Rehab OT Goals Patient Stated Goal: to go home OT Goal Formulation: With patient/family Time For Goal Achievement: 02/01/22 Potential to Achieve Goals: Good ADL Goals Pt Will Perform Grooming: with modified independence;sitting Pt Will Perform  Lower Body Dressing: with min assist;sit to/from stand Pt Will Transfer to Toilet: with min assist;ambulating;bedside commode  OT Frequency: Min 2X/week    Co-evaluation              AM-PAC OT "6 Clicks" Daily Activity     Outcome Measure Help from another person eating meals?: None Help from another person taking care of personal grooming?: A Little Help from another person toileting, which includes using toliet, bedpan, or urinal?: A Lot Help from another person bathing (including washing, rinsing, drying)?: A Lot Help from another person to put on and taking off regular upper body clothing?: A Little Help from another person to put on and taking off regular lower body clothing?: A Lot 6 Click Score: 16   End of Session Nurse Communication: Mobility status  Activity Tolerance: Patient tolerated treatment well Patient left: in bed;with call bell/phone within reach;with family/visitor present  OT Visit Diagnosis: Unsteadiness on feet (R26.81);Muscle weakness (generalized) (M62.81)                Time: [id number] OT Time Calculation (min): 18 min Charges:  OT General Charges $OT Visit: 1 Visit OT Evaluation $OT Eval Moderate Complexity:  1 Mod OT Treatments $Self Care/Home Management : 8-22 mins  Kathie Dike, M.S. OTR/L  01/18/22, 12:16 PM  ascom [phone]

## 2022-01-18 NOTE — Progress Notes (Signed)
Daughter [name] called for update. This RN provided current vitals and patient situation at this time. Patient remains comfortably resting in bed after eating breakfast. Will call [name] when patient moves pending bed availability.

## 2022-01-18 NOTE — Progress Notes (Signed)
[facility] CLINIC CARDIOLOGY CONSULT NOTE       Patient ID: TAKYAH CIARAMITARO MRN: [id number] DOB/AGE: [birth year] 81 y.o.  Admit date: 01/11/2022 Referring Physician Dr. Willy Eddy Primary Physician Dr. Einar Crow  Primary Cardiologist Dr. Juliann Pares Reason for Consultation NSTEMI  HPI: Pierina Schuknecht is an 14yoF with a PMH of CAD s/p CABG x2 in 2018 (patent LIMA to LAD,  occluded SVG to OM1 by [facility] 05/2021), h/o PCI x1 distal RCA (50% ISR by [facility] 05/2021), ischemic CM with LVEF with moderate hypo of LV mid apical anterolateral wall, 55-60%, g1dd, mod MR 12/2021, CKD 3, hyperlipidemia, type 2 diabetes, CAD, GERD who was recently hospitalized at [facility] from 7/12 - 7/18 initially for shortness of breath and developed chest pain, VF arrest with successful resuscitation and her NSTEMI was treated medically.  She presents to [facility] ED 2 days after discharge with acute encephalopathy, nausea, vomiting, and was intubated for airway protection in the ED. cardiology was consulted due to her significant cardiac history and concern for NSTEMI. Successfully extubated 7/26 to 3L   Interval History:  - feels SOB on 2L while eating breakfast - didn't sleep well last night  - no chest pain - good effort with PT yesterday afternoon    Past Medical History:  Diagnosis Date   Chest pain 12/06/2021   Diabetes mellitus without complication (HCC)    Hyperlipidemia    Hypertension    Myocardial infarction [facility])     Past Surgical History:  Procedure Laterality Date   CARDIAC SURGERY     bypass   CORONARY/GRAFT ACUTE MI REVASCULARIZATION N/A 06/01/2021   Procedure: Coronary/Graft Acute MI Revascularization;  Surgeon: Alwyn Pea, MD;  Location: [facility] INVASIVE CV LAB;  Service: Cardiovascular;  Laterality: N/A;   EYE SURGERY     cataract extraction   LEFT HEART CATH AND CORONARY ANGIOGRAPHY N/A 06/01/2021   Procedure: LEFT HEART CATH AND CORONARY ANGIOGRAPHY;  Surgeon: Alwyn Pea, MD;  Location:  [facility] INVASIVE CV LAB;  Service: Cardiovascular;  Laterality: N/A;    Medications Prior to Admission  Medication Sig Dispense Refill Last Dose   acetaminophen (TYLENOL) 325 MG tablet Take 650 mg by mouth at bedtime.   PRN at PRN   albuterol (VENTOLIN HFA) 108 (90 Base) MCG/ACT inhaler Inhale 1-2 puffs into the lungs every 4 (four) hours as needed for shortness of breath.      aspirin 81 MG EC tablet Take by mouth.      atorvastatin (LIPITOR) 80 MG tablet Take 80 mg by mouth every evening.      baclofen (LIORESAL) 10 MG tablet Take 10 mg by mouth 3 (three) times daily.      Cholecalciferol 25 MCG (1000 UT) capsule Take 1,000 Units by mouth 2 (two) times daily.      clopidogrel (PLAVIX) 75 MG tablet Take 1 tablet (75 mg total) by mouth daily. 30 tablet 0    feeding supplement (ENSURE ENLIVE / ENSURE PLUS) LIQD Take 237 mLs by mouth 3 (three) times daily between meals. 237 mL 12    insulin glargine (LANTUS) 100 UNIT/ML injection Inject 8 Units into the skin at bedtime.      insulin lispro (HUMALOG) 100 UNIT/ML KwikPen Inject 0-2 Units into the skin 3 (three) times daily.      ipratropium-albuterol (DUONEB) 0.5-2.5 (3) MG/3ML SOLN Take 3 mLs by nebulization every 6 (six) hours as needed (shortness of breath).      isosorbide mononitrate (IMDUR) 30 MG 24 hr tablet  Take 1 tablet (30 mg total) by mouth daily. 30 tablet 1    lisinopril (ZESTRIL) 20 MG tablet Take 1 tablet (20 mg total) by mouth daily. 30 tablet 1    lisinopril-hydrochlorothiazide (ZESTORETIC) 10-12.5 MG tablet Take 1 tablet by mouth daily.      metoprolol succinate (TOPROL-XL) 50 MG 24 hr tablet Take 50 mg by mouth daily.      Multiple Vitamin (MULTIVITAMIN WITH MINERALS) TABS tablet Take 1 tablet by mouth daily.      nitroGLYCERIN (NITROSTAT) 0.4 MG SL tablet Place 0.4 mg under the tongue every 5 (five) minutes as needed for chest pain.      ondansetron (ZOFRAN-ODT) 4 MG disintegrating tablet Take 4 mg by mouth every 8 (eight) hours as  needed.      senna (SENOKOT) 8.6 MG tablet Take 2 tablets by mouth at bedtime.      SPIRIVA HANDIHALER 18 MCG inhalation capsule Place 1 capsule into inhaler and inhale daily as needed for shortness of breath.      spironolactone (ALDACTONE) 25 MG tablet Take 12.5 mg by mouth daily.      torsemide (DEMADEX) 20 MG tablet Take 20 mg by mouth daily.      Social History   Socioeconomic History   Marital status: Divorced    Spouse name: Not on file   Number of children: Not on file   Years of education: Not on file   Highest education level: Not on file  Occupational History   Not on file  Tobacco Use   Smoking status: Former    Types: Cigarettes   Smokeless tobacco: Never  Substance and Sexual Activity   Alcohol use: Not Currently   Drug use: Not Currently   Sexual activity: Not on file  Other Topics Concern   Not on file  Social History Narrative   Not on file   Social Determinants of Health   Financial Resource Strain: Not on file  Food Insecurity: Not on file  Transportation Needs: Not on file  Physical Activity: Not on file  Stress: Not on file  Social Connections: Not on file  Intimate Partner Violence: Not on file    Family History  Problem Relation Age of Onset   Congestive Heart Failure Mother    Diabetes Father    Breast cancer Neg Hx      PHYSICAL EXAM General: Elderly and chronically ill-appearing Caucasian female sitting upright in ICU bed eating breakfast HEENT:  Normocephalic and atraumatic. Neck:  No JVD.  Lungs: short of breath appearing on 2L. Bibasilar crackles Heart: HRRR . Normal S1 and S2 with 3/6 systolic murmur heard throughout.  Radial pulses nonpalpable Abdomen: Non-distended appearing.  Msk: Normal strength and tone for age. Extremities: Warm without clubbing, cyanosis.  No peripheral edema.  Neuro: Awake and alert.  Psych: Answers questions appropriately.   Labs:   Lab Results  Component Value Date   WBC 11.7 (H) 01/18/2022   HGB  8.2 (L) 01/18/2022   HCT 26.4 (L) 01/18/2022   MCV 96.7 01/18/2022   PLT 351 01/18/2022    Recent Labs  Lab 01/18/22 0419  NA 135  K 4.2  CL 99  CO2 24  BUN 52*  CREATININE 1.06*  CALCIUM 8.5*  PROT 6.1*  BILITOT 0.8  ALKPHOS 56  ALT 16  AST 21  GLUCOSE 100*    No results found for: "CKTOTAL", "CKMB", "CKMBINDEX", "TROPONINI"  Lab Results  Component Value Date   CHOL 102 12/07/2021  CHOL 127 06/01/2021   Lab Results  Component Value Date   HDL 31 (L) 12/07/2021   HDL 47 06/01/2021   Lab Results  Component Value Date   LDLCALC 45 12/07/2021   LDLCALC 49 06/01/2021   Lab Results  Component Value Date   TRIG 121 01/16/2022   TRIG 101 01/04/2022   TRIG 128 12/07/2021   Lab Results  Component Value Date   CHOLHDL 3.3 12/07/2021   CHOLHDL 2.7 06/01/2021   No results found for: "LDLDIRECT"    Radiology: [facility] Chest Port 1 View  Result Date: 01/17/2022 CLINICAL DATA:  Fever, shortness of breath EXAM: PORTABLE CHEST 1 VIEW COMPARISON:  Previous studies including the examination of 01/15/2022 FINDINGS: Transverse diameter of Elnoria Howard is increased. Central pulmonary vessels are prominent. Increased interstitial markings are seen in the parahilar regions. Small to moderate bilateral pleural effusions are seen, more so on the left side with interval increase. Evaluation of left lower lung field for infiltrates is limited by the effusion. There is no pneumothorax. There is interval removal of endotracheal tube, central venous catheter and enteric tube. IMPRESSION: Central pulmonary vessels are prominent with increased interstitial markings in the parahilar regions suggesting CHF. Small to moderate bilateral pleural effusions, more so on the left side with interval increase. Electronically Signed   By: Elmer Picker M.D.   On: 01/17/2022 17:38   DG Chest Port 1 View  Result Date: 01/15/2022 CLINICAL DATA:  Respiratory failure EXAM: PORTABLE CHEST 1 VIEW COMPARISON:  Prior  chest x-ray 11/07/2041 FINDINGS: Endotracheal tube present. The tip is 4.7 cm above the carina. Left IJ approach central venous catheter with the catheter tip at the cavoatrial junction. A gastric tube is present. The proximal side-hole is visualized over the gastric bubble. Patient is status post median sternotomy with evidence of multivessel CABG. Sternal fixation hardware in place. Cardiac and mediastinal contours are within normal limits. Increased pulmonary vascular congestion now with mild interstitial edema. Probable small bilateral pleural effusions with associated bibasilar atelectasis. No pneumothorax. No acute osseous abnormality. IMPRESSION: 1. Stable and satisfactory support apparatus. 2. Increased pulmonary vascular congestion now with mild interstitial edema. 3. Small bilateral layering pleural effusions. Electronically Signed   By: Jacqulynn Cadet M.D.   On: 01/15/2022 10:15   ECHOCARDIOGRAM LIMITED  Result Date: 01/12/2022    ECHOCARDIOGRAM LIMITED REPORT   Patient Name:   MARBETH RAHMING [facility] Date of Exam: 01/12/2022 Medical Rec #:  [id number]     Height:       59.0 in Accession #:    [id number]    Weight:       140.4 lb Date of Birth:  [birth year]     BSA:          1.587 m Patient Age:    30 years      BP:           146/47 mmHg Patient Gender: F             HR:           51 bpm. Exam Location:  [facility] Procedure: Limited Echo, Cardiac Doppler, Color Doppler and Intracardiac            Opacification Agent Indications:     Elevated Troponin  History:         Patient has prior history of Echocardiogram examinations, most                  recent 01/04/2022.  Sonographer:  Sherrie Sport Referring Phys:  [id number] [place] Jahid Weida Diagnosing Phys: Yolonda Kida MD  Sonographer Comments: Suboptimal parasternal window and suboptimal apical window. IMPRESSIONS  1. Left ventricular ejection fraction, by estimation, is 45 to 50%. The left ventricle has mildly decreased function. The left ventricle demonstrates  global hypokinesis. The left ventricular internal cavity size was mildly dilated. Left ventricular diastolic function could not be evaluated.  2. Right ventricular systolic function is normal.  3. Moderate pleural effusion in the left lateral region.  4. The mitral valve is normal in structure.  5. The aortic valve is normal in structure. Aortic valve regurgitation is not visualized. Conclusion(s)/Recommendation(s): Poor windows for evaluation of left ventricular function by transthoracic echocardiography. Would recommend an alternative means of evaluation. FINDINGS  Left Ventricle: Left ventricular ejection fraction, by estimation, is 45 to 50%. The left ventricle has mildly decreased function. The left ventricle demonstrates global hypokinesis. Definity contrast agent was given IV to delineate the left ventricular  endocardial borders. The left ventricular internal cavity size was mildly dilated. There is no left ventricular hypertrophy. Left ventricular diastolic function could not be evaluated. Right Ventricle: No increase in right ventricular wall thickness. Right ventricular systolic function is normal. Left Atrium: Left atrial size was normal in size. Right Atrium: Right atrial size was normal in size. Mitral Valve: The mitral valve is normal in structure. Tricuspid Valve: The tricuspid valve is normal in structure. Aortic Valve: The aortic valve is normal in structure. Aortic valve regurgitation is not visualized. Pulmonic Valve: The pulmonic valve was normal in structure. Aorta: The ascending aorta was not well visualized. IAS/Shunts: No atrial level shunt detected by color flow Doppler. Additional Comments: There is a moderate pleural effusion in the left lateral region. LEFT VENTRICLE PLAX 2D LVIDd:         4.70 cm LVIDs:         3.40 cm LV PW:         1.10 cm LV IVS:        0.90 cm LVOT diam:     2.00 cm LVOT Area:     3.14 cm  LEFT ATRIUM         Index LA diam:    3.00 cm 1.89 cm/m   AORTA Ao Root  diam: 2.90 cm TRICUSPID VALVE TR Peak grad:   28.9 mmHg TR Vmax:        269.00 cm/s  SHUNTS Systemic Diam: 2.00 cm Yolonda Kida MD Electronically signed by Yolonda Kida MD Signature Date/Time: 01/12/2022/2:52:11 PM    Final    DG Chest Port 1 View  Result Date: 01/11/2022 CLINICAL DATA:  Central line placement. EXAM: PORTABLE CHEST 1 VIEW COMPARISON:  Radiograph and CT earlier today. FINDINGS: New left internal jugular central venous catheter tip overlies the atrial caval junction. No pneumothorax. Stable endotracheal and enteric tubes. Stable heart size and mediastinal contours. Small bilateral pleural effusions, increasing on the left. No pulmonary edema or new airspace disease. IMPRESSION: 1. New left internal jugular central venous catheter with tip over the atrial caval junction. No pneumothorax. 2. Stable endotracheal and enteric tubes. 3. Small bilateral pleural effusions, increasing on the left. Electronically Signed   By: Keith Rake M.D.   On: 01/11/2022 18:41   CT HEAD WO CONTRAST (5MM)  Result Date: 01/11/2022 CLINICAL DATA:  Altered mental status EXAM: CT HEAD WITHOUT CONTRAST TECHNIQUE: Contiguous axial images were obtained from the base of the skull through the vertex without intravenous contrast. RADIATION  DOSE REDUCTION: This exam was performed according to the departmental dose-optimization program which includes automated exposure control, adjustment of the mA and/or kV according to patient size and/or use of iterative reconstruction technique. COMPARISON:  01/03/2022 FINDINGS: Brain: No acute intracranial findings are seen in noncontrast CT brain. There are no signs of bleeding within the cranium. Cortical sulci are prominent. Vascular: Unremarkable. Skull: Unremarkable Sinuses/Orbits: Unremarkable. Other: None. IMPRESSION: No acute intracranial findings are seen.  Atrophy. Electronically Signed   By: Ernie Avena M.D.   On: 01/11/2022 08:28   CT CHEST ABDOMEN  PELVIS WO CONTRAST  Result Date: 01/11/2022 CLINICAL DATA:  Shortness of breath, altered mental status EXAM: CT CHEST, ABDOMEN AND PELVIS WITHOUT CONTRAST TECHNIQUE: Multidetector CT imaging of the chest, abdomen and pelvis was performed following the standard protocol without IV contrast. RADIATION DOSE REDUCTION: This exam was performed according to the departmental dose-optimization program which includes automated exposure control, adjustment of the mA and/or kV according to patient size and/or use of iterative reconstruction technique. COMPARISON:  01/03/2022 FINDINGS: CT CHEST FINDINGS Cardiovascular: Extensive coronary artery calcifications are seen. There is evidence of previous coronary bypass surgery. Scattered calcifications are seen in thoracic aorta and its major branches. Mediastinum/Nodes: Slightly enlarged lymph nodes are seen in mediastinum with no significant interval change. Tip of endotracheal tube is at the carina and should be pulled back 2-3 cm. Lungs/Pleura: Small patchy infiltrates are seen in left parahilar region and posterior lower lung fields. There is interval appearance of small bilateral pleural effusions. There is no pneumothorax. There is slight thickening of interlobular septi. Musculoskeletal: No acute findings are seen. CT ABDOMEN PELVIS FINDINGS Hepatobiliary: No focal abnormalities are seen in liver. There is no dilation of bile ducts. Gallbladder is distended, possibly due to fasting state. There is no wall thickening in gallbladder. Gallbladder stones are seen. Pancreas: No focal abnormalities are seen. Diverticulum is noted along the inner margin of second portion of duodenum. Spleen: Unremarkable. Adrenals/Urinary Tract: Adrenals are unremarkable. Left kidney is much smaller than right. This may be a congenital variation or suggest renal artery stenosis or chronic pyelonephritis. There is no hydronephrosis. There are no renal or ureteral stones. Foley catheter is seen in  the bladder. Stomach/Bowel: Tip of enteric tube is seen in the stomach. There is no dilation of small bowel loops. Appendix is unremarkable. There is no significant wall thickening in colon. Scattered diverticula are seen in the colon without signs of focal acute diverticulitis. Vascular/Lymphatic: Extensive arterial calcifications are seen. Reproductive: Coarse calcifications seen in pelvis may suggest calcified uterine fibroids. Other: There is no ascites or pneumoperitoneum. Musculoskeletal: Degenerative changes are noted in lumbar spine, particularly severe at L2-L3 and L3-L4 levels. This finding has not changed significantly. IMPRESSION: Tip of endotracheal tube is at the carina and should be pulled back 2-3 cm. There is interval appearance of small bilateral pleural effusions, more so on the left side. There is thickening of interlobular septi suggesting possible interstitial pulmonary edema. There are patchy densities in the left parahilar region and both lower lung fields which may be due to pulmonary edema or pneumonia. Extensive coronary artery calcifications are seen. There is no evidence of intestinal obstruction or pneumoperitoneum. There is no hydronephrosis. Gallbladder stones. Diverticulosis of colon without signs of diverticulitis. Other findings as described in the body of the report. Electronically Signed   By: Ernie Avena M.D.   On: 01/11/2022 08:27   DG Abd Portable 1 View  Result Date: 01/11/2022 CLINICAL DATA:  OG  tube placement. EXAM: PORTABLE ABDOMEN - 1 VIEW COMPARISON:  AP chest 01/11/2022 at 704 hours FINDINGS: New orogastric tube with side port overlying the proximal stomach and tip overlying the mid aspect of the greater curvature. Nonobstructed upper abdominal bowel-gas pattern. No portal venous gas or pneumatosis. Severe left L3-4 disc space narrowing and moderate to severe diffuse L4-5 disc space narrowing. Mild dextrocurvature centered at L3-4. IMPRESSION: Orogastric  tube in appropriate position. Electronically Signed   By: Neita Garnet M.D.   On: 01/11/2022 08:11   DG Chest Portable 1 View  Result Date: 01/11/2022 CLINICAL DATA:  Tube placement.  Encounter for intubation. EXAM: PORTABLE CHEST 1 VIEW COMPARISON:  AP chest 01/11/2022 at 704 hours FINDINGS: AP chest 01/11/2022 at 735 hours. Interval placement of endotracheal tube with tip terminating imaging of the thoracic inlet and the carina proximally 3.2 cm above the carina. Enteric tube descends below the diaphragm with the side port overlying the proximal stomach and the tip excluded by inferior collimation, new from prior. Postsurgical changes are again seen of median sternotomy and CABG. Cardiac silhouette and mediastinal contours are within normal limits. Moderate calcification within the aortic arch. Mild left basilar linear likely subsegmental atelectasis. No definite pleural effusion. No pneumothorax is seen. No acute skeletal abnormality. IMPRESSION: 1. New endotracheal tube and enteric tube in appropriate position. 2. No acute lung process. 3.  Aortic Atherosclerosis (ICD10-I70.0). Electronically Signed   By: Neita Garnet M.D.   On: 01/11/2022 08:10   DG Chest Portable 1 View  Result Date: 01/11/2022 CLINICAL DATA:  Provided history: New onset dyspnea, recent cardiac event. EXAM: PORTABLE CHEST 1 VIEW COMPARISON:  Prior chest radiographs 01/04/2022 and earlier. FINDINGS: Prior median sternotomy/CABG. Heart size within normal limits. Aortic atherosclerosis. No appreciable airspace consolidation or pulmonary edema. No evidence of pleural effusion or pneumothorax. No acute bony abnormality identified. IMPRESSION: No evidence of acute cardiopulmonary abnormality. Aortic Atherosclerosis (ICD10-I70.0). Electronically Signed   By: Jackey Loge D.O.   On: 01/11/2022 08:03   ECHOCARDIOGRAM LIMITED  Result Date: 01/04/2022    ECHOCARDIOGRAM LIMITED REPORT   Patient Name:   MARIJAH MOLENDA [facility] Date of Exam: 01/04/2022  Medical Rec #:  [id number]     Height:       59.0 in Accession #:    [id number]    Weight:       127.4 lb Date of Birth:  1940/10/29     BSA:          1.523 m Patient Age:    81 years      BP:           128/50 mmHg Patient Gender: F             HR:           54 bpm. Exam Location:  [facility] Procedure: Limited Echo, Color Doppler and Cardiac Doppler Indications:     Cardiac Areest I46.9  History:         Patient has prior history of Echocardiogram examinations, most                  recent 12/07/2021. Previous Myocardial Infarction,                  Signs/Symptoms:Chest Pain; Risk Factors:Diabetes and                  Hypertension.  Sonographer:     Cristela Blue Referring Phys:  [id number] [facility] MICHELLE Zaylei Mullane Diagnosing Phys: Arnoldo Hooker  MD  Sonographer Comments: Image acquisition challenging due to patient behavioral factors. IMPRESSIONS  1. The left ventricle demonstrates regional wall motion abnormalities (see scoring diagram/findings for description).  2. Right ventricular systolic function is normal. The right ventricular size is normal.  3. Left atrial size was moderately dilated.  4. The mitral valve is normal in structure. Moderate mitral valve regurgitation.  5. Tricuspid valve regurgitation is mild to moderate.  6. The aortic valve is normal in structure. Aortic valve regurgitation is mild. FINDINGS  Left Ventricle: The left ventricle demonstrates regional wall motion abnormalities. Moderate hypokinesis of the left ventricular, mid-apical anterolateral wall. Right Ventricle: The right ventricular size is normal. No increase in right ventricular wall thickness. Right ventricular systolic function is normal. Left Atrium: Left atrial size was moderately dilated. Right Atrium: Right atrial size was normal in size. Pericardium: There is no evidence of pericardial effusion. Mitral Valve: The mitral valve is normal in structure. Moderate mitral valve regurgitation. Tricuspid Valve: The tricuspid valve is normal in  structure. Tricuspid valve regurgitation is mild to moderate. Aortic Valve: The aortic valve is normal in structure. Aortic valve regurgitation is mild. Pulmonic Valve: The pulmonic valve was normal in structure. Pulmonic valve regurgitation is trivial. Aorta: The aortic root and ascending aorta are structurally normal, with no evidence of dilitation. IAS/Shunts: No atrial level shunt detected by color flow Doppler. LEFT VENTRICLE PLAX 2D LVIDd:         4.40 cm LVIDs:         3.20 cm LV PW:         1.10 cm LV IVS:        1.00 cm LVOT diam:     2.00 cm LVOT Area:     3.14 cm  LEFT ATRIUM         Index LA diam:    3.40 cm 2.23 cm/m   AORTA Ao Root diam: 2.90 cm  SHUNTS Systemic Diam: 2.00 cm Serafina Royals MD Electronically signed by Serafina Royals MD Signature Date/Time: 01/04/2022/4:56:35 PM    Final    DG Chest 1 View  Result Date: 01/04/2022 CLINICAL DATA:  81 year old female intubated.  Post CPR. EXAM: CHEST  1 VIEW COMPARISON:  Portable chest 01/03/2022 and earlier. FINDINGS: Portable AP semi upright view at 0611 hours. Endotracheal tube tip partially obscured by sternotomy hardware, but appears to be in good position between the level the clavicles and carina. Enteric tube courses into the abdomen, tip is not included. Stable pacer or resuscitation pads over the left chest. Stable lung volumes. Mediastinal contours remain normal. Allowing for portable technique the lungs are clear. No pneumothorax or pleural effusion identified. Negative visible bowel gas. Stable visualized osseous structures. IMPRESSION: 1. Satisfactory ET tube position. Enteric tube courses to the abdomen, tip not included. 2. No acute cardiopulmonary abnormality. Electronically Signed   By: Genevie Ann M.D.   On: 01/04/2022 07:26   DG Chest Portable 1 View  Result Date: 01/03/2022 CLINICAL DATA:  Intubation, post CPR EXAM: PORTABLE CHEST 1 VIEW COMPARISON:  01/03/2022 FINDINGS: Endotracheal tube is in the right mainstem bronchus.  Recommend retracting approximately 5 cm. NG tube enters the stomach. Prior CABG. Heart is normal size. Increasing airspace disease throughout the left lung which may reflect atelectasis due to right mainstem intubation. Similar right upper lobe opacity/atelectasis. No effusions. IMPRESSION: Right mainstem intubation. Recommend retracting endotracheal tube approximately 5 cm. Areas of airspace disease in the left lung and right upper lobe may be related to atelectasis  from right mainstem intubation. These results were called by telephone at the time of interpretation on 01/03/2022 at 10:21 pm to provider [facility] , who verbally acknowledged these results. Electronically Signed   By: Rolm Baptise M.D.   On: 01/03/2022 22:24   CT Cervical Spine Wo Contrast  Result Date: 01/03/2022 CLINICAL DATA:  Neck trauma (Age >= 65y) EXAM: CT CERVICAL SPINE WITHOUT CONTRAST TECHNIQUE: Multidetector CT imaging of the cervical spine was performed without intravenous contrast. Multiplanar CT image reconstructions were also generated. RADIATION DOSE REDUCTION: This exam was performed according to the departmental dose-optimization program which includes automated exposure control, adjustment of the mA and/or kV according to patient size and/or use of iterative reconstruction technique. COMPARISON:  None Available. FINDINGS: Alignment: Normal Skull base and vertebrae: No acute fracture. No primary bone lesion or focal pathologic process. Soft tissues and spinal canal: No prevertebral fluid or swelling. No visible canal hematoma. 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 Mild bilateral degenerative facet disease. Upper chest: No acute findings Other: None IMPRESSION: Degenerative disc and facet disease.  No acute bony abnormality. Electronically Signed   By: Rolm Baptise M.D.   On: 01/03/2022 20:10   CT HEAD WO CONTRAST (5MM)  Result Date: 01/03/2022 CLINICAL DATA:  Head  trauma, minor (Age >= 65y) EXAM: CT HEAD WITHOUT CONTRAST TECHNIQUE: Contiguous axial images were obtained from the base of the skull through the vertex without intravenous contrast. RADIATION DOSE REDUCTION: This exam was performed according to the departmental dose-optimization program which includes automated exposure control, adjustment of the mA and/or kV according to patient size and/or use of iterative reconstruction technique. COMPARISON:  10/03/2021 FINDINGS: Brain: No acute intracranial abnormality. Specifically, no hemorrhage, hydrocephalus, mass lesion, acute infarction, or significant intracranial injury. Vascular: No hyperdense vessel or unexpected calcification. Skull: No acute calvarial abnormality. Sinuses/Orbits: No acute findings Other: None IMPRESSION: Normal study for patient's age. Electronically Signed   By: Rolm Baptise M.D.   On: 01/03/2022 20:08   CT CHEST ABDOMEN PELVIS WO CONTRAST  Result Date: 01/03/2022 CLINICAL DATA:  Pneumonia, complication suspected, xray done. Shortness of breath. EXAM: CT CHEST, ABDOMEN AND PELVIS WITHOUT CONTRAST TECHNIQUE: Multidetector CT imaging of the chest, abdomen and pelvis was performed following the standard protocol without IV contrast. RADIATION DOSE REDUCTION: This exam was performed according to the departmental dose-optimization program which includes automated exposure control, adjustment of the mA and/or kV according to patient size and/or use of iterative reconstruction technique. COMPARISON:  None Available. FINDINGS: CT CHEST FINDINGS Cardiovascular: Heavily calcified aorta and coronary arteries. Prior CABG. Heart is normal size. Aorta is normal caliber. Mediastinum/Nodes: No mediastinal, hilar, or axillary adenopathy. Trachea and esophagus are unremarkable. Thyroid unremarkable. Lungs/Pleura: Lungs are clear. No focal airspace opacities or suspicious nodules. No effusions. Musculoskeletal: Chest wall soft tissues are unremarkable. No acute  bony abnormality. CT ABDOMEN PELVIS FINDINGS Hepatobiliary: Small gallstone within the gallbladder. No focal hepatic abnormality. Pancreas: No focal abnormality or ductal dilatation. Spleen: No focal abnormality.  Normal size. Adrenals/Urinary Tract: Left kidney is atrophic. No renal or adrenal mass. No stones or hydronephrosis. Urinary bladder unremarkable. Stomach/Bowel: Left colonic diverticulosis. No active diverticulitis. Stomach and small bowel decompressed, unremarkable. Vascular/Lymphatic: Heavily calcified aorta. No evidence of aneurysm or adenopathy. Reproductive: Calcified fibroids in the uterus.  No adnexal masses. Other: No free fluid or free air. Musculoskeletal: No acute bony abnormality. Scoliosis and degenerative changes in the lumbar spine. IMPRESSION: No acute cardiopulmonary disease.  Diffuse aortoiliac atherosclerosis.  No aneurysm. Cholelithiasis. Left colonic diverticulosis.  No active diverticulitis. No acute findings in the abdomen or pelvis. Electronically Signed   By: Rolm Baptise M.D.   On: 01/03/2022 20:05   DG Chest 2 View  Result Date: 01/03/2022 CLINICAL DATA:  Shortness of breath EXAM: CHEST - 2 VIEW COMPARISON:  12/06/2021 FINDINGS: Prior CABG. Heart and mediastinal contours are within normal limits. No focal opacities or effusions. No acute bony abnormality. IMPRESSION: No active cardiopulmonary disease. Electronically Signed   By: Rolm Baptise M.D.   On: 01/03/2022 19:25    ECHO 01/04/2022  1. The left ventricle demonstrates regional wall motion abnormalities  (see scoring diagram/findings for description).   2. Right ventricular systolic function is normal. The right ventricular  size is normal.   3. Left atrial size was moderately dilated.   4. The mitral valve is normal in structure. Moderate mitral valve  regurgitation.   5. Tricuspid valve regurgitation is mild to moderate.   6. The aortic valve is normal in structure. Aortic valve regurgitation is  mild.    FINDINGS   Left Ventricle: The left ventricle demonstrates regional wall motion  abnormalities. Moderate hypokinesis of the left ventricular, mid-apical  anterolateral wall.   Right Ventricle: The right ventricular size is normal. No increase in  right ventricular wall thickness. Right ventricular systolic function is  normal.   Left Atrium: Left atrial size was moderately dilated.   Right Atrium: Right atrial size was normal in size.   Pericardium: There is no evidence of pericardial effusion.   Mitral Valve: The mitral valve is normal in structure. Moderate mitral  valve regurgitation.   Tricuspid Valve: The tricuspid valve is normal in structure. Tricuspid  valve regurgitation is mild to moderate.   Aortic Valve: The aortic valve is normal in structure. Aortic valve  regurgitation is mild.   Pulmonic Valve: The pulmonic valve was normal in structure. Pulmonic valve  regurgitation is trivial.   Aorta: The aortic root and ascending aorta are structurally normal, with  no evidence of dilitation.   IAS/Shunts: No atrial level shunt detected by color flow Doppler.   12/07/2021  1. Left ventricular ejection fraction, by estimation, is 55 to 60%. The  left ventricle has normal function. The left ventricle has no regional  wall motion abnormalities. Left ventricular diastolic parameters are  consistent with Grade I diastolic  dysfunction (impaired relaxation).   2. Right ventricular systolic function is normal. The right ventricular  size is normal.   3. The mitral valve is normal in structure. Moderate mitral valve  regurgitation.   4. The aortic valve is normal in structure. Aortic valve regurgitation is  mild.   FINDINGS   Left Ventricle: Left ventricular ejection fraction, by estimation, is 55  to 60%. The left ventricle has normal function. The left ventricle has no  regional wall motion abnormalities. The left ventricular internal cavity  size was normal in size.  There is   no left ventricular hypertrophy. Left ventricular diastolic parameters  are consistent with Grade I diastolic dysfunction (impaired relaxation).   Right Ventricle: The right ventricular size is normal. No increase in  right ventricular wall thickness. Right ventricular systolic function is  normal.   Left Atrium: Left atrial size was normal in size.   Right Atrium: Right atrial size was normal in size.   Pericardium: There is no evidence of pericardial effusion.   Mitral Valve: The mitral valve is normal in structure. Moderate mitral  valve regurgitation. MV peak gradient, 4.8 mmHg. The mean mitral valve  gradient is 1.0 mmHg.   Tricuspid Valve: The tricuspid valve is grossly normal. Tricuspid valve  regurgitation is mild.   Aortic Valve: The aortic valve is normal in structure. Aortic valve  regurgitation is mild. Aortic regurgitation PHT measures 567 msec. Aortic  valve mean gradient measures 10.0 mmHg. Aortic valve peak gradient  measures 16.9 mmHg. Aortic valve area, by VTI   measures 1.12 cm.   Pulmonic Valve: The pulmonic valve was normal in structure. Pulmonic valve  regurgitation is not visualized.   Aorta: The ascending aorta was not well visualized.   IAS/Shunts: No atrial level shunt detected by color flow Doppler.   TELEMETRY reviewed by me: sinus rhythm rate 70s, multiple short runs of NSVT this morning.  EKG reviewed by me: sinus bradycardia 57, lateral ST depressions V5-6, improving on repeat. LBBB  ASSESSMENT AND PLAN:  Dianara Forsey is an 72yoF with a PMH of CAD s/p CABG x2 in 2018 (patent LIMA to LAD,  occluded SVG to OM1 by [facility] 05/2021), h/o PCI x1 distal RCA (50% ISR by [facility] 05/2021), ischemic CM with LVEF with moderate hypo of LV mid apical anterolateral wall, 55-60%, g1dd, mod MR 12/2021, CKD 3, hyperlipidemia, type 2 diabetes, CAD, GERD who was recently hospitalized at [facility] from 7/12 - 7/18 initially for shortness of breath and developed chest  pain, VF arrest with successful resuscitation and her NSTEMI was treated medically.  She presents to [facility] ED 2 days after discharge with acute encephalopathy, nausea, vomiting, and was intubated for airway protection in the ED. cardiology was consulted due to her significant cardiac history and concern for NSTEMI. Successfully extubated 7/26 to 3L   #Acute encephalopathy #recent NSTEMI 01/04/2022 - h/o significant CAD s/p CABG x2 with known occluded SVG to OM1, LIMA to LAD patent by [facility] 05/2021 #Ischemic cardiomyopathy #Acute hypoxic respiratory failure Patient has a complicated and significant history of CAD with multiple admissions over the past year for chest pain and has been medically managed due to patient preference.  She was most recently hospitalized last week with chest pain, VF arrest with ROSC requiring intubation.  Initial EKG showed significant lateral ST depressions and echo showed a new LV mid apical anterolateral hypokinesis.  Her NSTEMI was ultimately treated medically.  She presents again 2 days after discharge with confusion and acting strangely with multiple episodes of emesis.  Her troponin is downtrending from last admission at 2206-1700 (compared to a peak of 6300 previously). Her EKG shows lateral ST depressions and LBBB, improving on repeats. UA is positive for leukocytes she is being treated for possible aspiration pneumonia.  -successfully extubated 7/25 and has been off vasopressor support  -Agree with current therapy per primary team -s/p 325 mg aspirin, continue DAPT with aspirin 81 mg and 75 mg clopidogrel daily. -S/p heparin drip for 48 hours, ended 7/22 -continue nitropaste 0.5inch q6h  -Continue atorvastatin 80 mg nightly -s/p lasix 40mg  IV x2 yesterday with great output,  although she remains net positive:  -give another 40mg  IV lasix TID today  -continue low dose metoprolol XL 12.5mg  and restart isosorbide 15 mg daily. -Hold torsemide 20 mg, spironolactone 12.5 mg,  lisinopril 20 mg -Limited echo resulted with an EF of 45 to 50% with global hypokinesis, LV dilation  This patient's plan of care was discussed and created with Dr. Saralyn Pilar and he is in agreement.  Signed: Tristan Schroeder , PA-C 01/18/2022, 9:39 AM [facility]  Cardiology

## 2022-01-18 NOTE — TOC Initial Note (Signed)
Transition of Care [facility]) - Initial/Assessment Note    Patient Details  Name: Lindsey Pollard MRN: [id number] Date of Birth: 1940/12/17  [facility] CM/SW Contact:    Allayne Butcher, RN Phone Number: 01/18/2022, 9:08 PM  Clinical Narrative:                 Patient admitted to the hospital with NSTEMI initially requiring intubation.  Patient extubated on 7/25 now tolerating [place] 2L.  Patient has been working working with PT and OT and they are recommending SNF.  Patient and family agrees with short term rehab. Patient came in from home, she was recently discharged home with home health but home health agency [facility] never got a chance to go out before patient was re admitted.  Daughter lives in [place], the patient has voiced that she wants to move in with her daughter.  Current plan for her to go to rehab here and then go live with her daughter.    Expected Discharge Plan: Skilled Nursing Facility Barriers to Discharge: Continued Medical Work up   Patient Goals and CMS Choice Patient states their goals for this hospitalization and ongoing recovery are:: to go for STR then move to live with her daughter CMS Medicare.gov Compare Post Acute Care list provided to:: Patient Choice offered to / list presented to : Patient  Expected Discharge Plan and Services Expected Discharge Plan: Skilled Nursing Facility   Discharge Planning Services: CM Consult Post Acute Care Choice: Skilled Nursing Facility Living arrangements for the past 2 months: Single Family Home                           HH Arranged: NA          Prior Living Arrangements/Services Living arrangements for the past 2 months: Single Family Home Lives with:: Friends Patient language and need for interpreter reviewed:: Yes Do you feel safe going back to the place where you live?: No   family not comfortable with patient returning home alone  Need for Family Participation in Patient Care: Yes  (Comment) Care giver support system in place?: Yes (comment) Current home services: DME (rollator) Criminal Activity/Legal Involvement Pertinent to Current Situation/Hospitalization: No - Comment as needed  Activities of Daily Living Home Assistive Devices/Equipment: None    Permission Sought/Granted Permission sought to share information with : Case Manager, Family Supports, Magazine features editor Permission granted to share information with : Yes, Verbal Permission Granted  Share Information with NAME: Loleta Books  Permission granted to share info w AGENCY: SNF's  Permission granted to share info w Relationship: nephew  Permission granted to share info w Contact Information: [phone]  Emotional Assessment Appearance:: Appears stated age Attitude/Demeanor/Rapport: Engaged Affect (typically observed): Accepting Orientation: : Oriented to Self, Oriented to Place, Oriented to  Time, Oriented to Situation Alcohol / Substance Use: Not Applicable Psych Involvement: No (comment)  Admission diagnosis:  NSTEMI (non-ST elevated myocardial infarction) [facility]) [I21.4] Patient Active Problem List   Diagnosis Date Noted   Fever and chills 01/17/2022   Malnutrition of moderate degree 01/11/2022   Overweight (BMI 25.0-29.9) 01/06/2022   Cardiac arrest with successful resuscitation (HCC) 01/03/2022   Unstable angina (HCC) 12/07/2021   NSTEMI (non-ST elevated myocardial infarction) (HCC) 12/06/2021   Chronic kidney disease, stage 3a (HCC) 12/06/2021   Lung nodule 12/06/2021   Type II diabetes mellitus with renal manifestations (HCC) 12/06/2021   HTN (hypertension) 12/06/2021   HLD (hyperlipidemia)  12/06/2021   AKI (acute kidney injury) (HCC) in the setting of stage IIIa chronic    Anemia    Acute respiratory failure with hypoxia (HCC)    Chronic diastolic CHF (congestive heart failure) (HCC)    Fungal infection of the groin    Fungal infection of skin    Essential  hypertension 06/01/2021   Carotid stenosis 10/26/2020   Atherosclerosis of native arteries of extremity with intermittent claudication (HCC) 09/16/2020   Carotid bruit present 09/16/2020   Healthcare maintenance 01/06/2019   Aortic calcification (HCC) 06/19/2017   Ischemic cardiomyopathy 06/19/2017   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/17/2017   PVD (peripheral vascular disease) (HCC) 06/14/2017   Pseudophakia of right eye 11/10/2014   Bilateral hearing loss 10/05/2014   CAD (coronary artery disease) 10/05/2014   GERD (gastroesophageal reflux disease) 10/05/2014   RLS (restless legs syndrome) 09/09/2014   Mixed hyperlipidemia 08/27/2014   Type 2 diabetes mellitus with hyperlipidemia (HCC) 08/27/2014   PCP:  Lauro Regulus, MD Pharmacy:   [facility] [address] [place] - [address] [place] [address] Phone: [phone] Fax: [phone]  [facility] ) - [place], [place] - [address] [place] [address] Phone: [phone] Fax: [phone]     Social Determinants of Health (SDOH) Interventions    Readmission Risk Interventions    01/18/2022    9:04 PM 01/05/2022    5:09 PM  Readmission Risk Prevention Plan  Transportation Screening Complete Complete  PCP or Specialist Appt within 3-5 Days  Complete  Social Work Consult for Recovery Care Planning/Counseling  Not Complete  SW consult not completed comments  NA  Palliative Care Screening  Not Applicable  Medication Review Oceanographer) Referral to Pharmacy Referral to Pharmacy  PCP or Specialist appointment within 3-5 days of discharge Complete   HRI or Home Care Consult Complete   SW Recovery Care/Counseling Consult Not Complete   SW Consult Not Complete Comments NA   Palliative Care Screening Not Applicable   Skilled Nursing Facility Complete

## 2022-01-18 NOTE — Progress Notes (Signed)
PROGRESS NOTE    DELBERT DOHRMAN  [id number] DOB: 1941/02/03  DOA: 01/11/2022 Date of Service: 01/18/22 PCP: [name], MD     Brief Narrative / Hospital Course:  81 year old female with a past medical history significant for recent V-fib arrest, HFpEF, ischemic cardiomyopathy, CAD status post CABG x2 in 2018 and PCI, chronic kidney disease stage III, diabetes mellitus who presents to [facility] ED on 01/11/2022 due to altered mental status, respiratory distress, and nausea/vomiting. (Of note: Was recently admitted from 7/12 through 7/18 for brief cardiac arrest in the setting of NSTEMI and acute CHF, along with acute kidney injury, discharged home with PT/OT and cardiac rehab but continued to be weak, very sleepy (more than normal), with poor p.o. intake.) 07/20: Intubated in ED, cardiology consulted for NSTEMI, admitted to PCCM. 07/21: Multiorgan failure, requiring pressors, poor prognosis 07/22, 07/23: DNR. Failed weaning trials 07/24: failed SAT/SBT d/t ischemic cardiomyopathy  07/25: No further interventions per cardiology. GOC discussion w/ Dr Mortimer Fries (ICU), remains DNR, agreed to extubation, increased WOB and trial of BiPap but pt did not tolerate this. Plan transfer to [facility] tomorrow.  07/26: TRH pickup from ICU. O2 requirement lower, will transfer out of SDU. PT/OT pending will likely need SNF/rehab. Temp increasing, repeat BCx, CXR, UA (no apparent UTI), resp PCR (negative), remove foley, may need to restart abx. CXR shows increased edema 07/27: lasix dose x2, WBC trending down, afebrile. BCx NG<24h. O2 saturation good on 2L but notable increased WOB on exam.  PT/OT recommending SNF  Consultants:  Cardiology PCCU (initially admitted to ICU)  Procedures: Intubation 07/20, extubation 07/25 Central line placement 07/20, removed 07/26    Subjective: Patient reports feeling short winded same as yesterday.      ASSESSMENT & PLAN:   Principal Problem:   NSTEMI (non-ST  elevated myocardial infarction) ([place]) Active Problems:   Unstable angina (HCC)   Acute respiratory failure with hypoxia (HCC)   AKI (acute kidney injury) ([place]) in the setting of stage IIIa chronic   S/P CABG x 2   Malnutrition of moderate degree   Fever and chills   NSTEMI (non-ST elevated myocardial infarction) ([place]) Unstable angina CAD s/p CABG Ischemic cardiomyopathy HTN HFrEF Managed medically Cardiology following - no further invasive procedures planned, recommending give another 40mg  IV lasix TID today, restart isosorbide 15 mg daily. To continue low dose metoprolol XL 12.5mg , DAPT with aspirin 81 mg and 75 mg clopidogrel daily, nitropaste 0.5inch q6h, atorvastatin 80 mg nightly,  (holding torsemide 20 mg, spironolactone 12.5 mg, lisinopril 20 mg)  AKI (acute kidney injury) ([place]) in the setting of stage IIIa chronic Improving  Monitor serial BMP  Acute respiratory failure with hypoxia (HCC) Acute encephalopathy Improved, off ventilator   Fever and chills Temp increasing, repeat BCx, CXR, UA, resp PCR, remove foley, may need to restart abx.      DVT prophylaxis: lovenox Code Status: DNR Family Communication: family at bedside on rounds yesterday Disposition Plan / TOC needs:  SNF Barriers to discharge / significant pending items: continued monitoring post-extubation, will likely need SNFplacement, hope to D/C by end of the week to SNF. Getting increased Lasix today and hopefully will improve WOB             Objective: Vitals:   01/18/22 0600 01/18/22 0700 01/18/22 0800 01/18/22 1000  BP: (!) 134/50 (!) 123/55  130/87  Pulse: 81 81  85  Resp: 16 15  19   Temp:   97.6 F (36.4 C)   TempSrc:  Oral   SpO2: 100% 100%  100%  Weight:      Height:        Intake/Output Summary (Last 24 hours) at 01/18/2022 1330 Last data filed at 01/18/2022 0900 Gross per 24 hour  Intake --  Output 1350 ml  Net -1350 ml   Filed Weights   01/16/22 0451 01/17/22 0500  01/18/22 0500  Weight: 60.1 kg 60 kg 61.3 kg    Examination:  Constitutional:  VS as above General Appearance: alert, frail but not cachectic, NAD Eyes: Normal lids and conjunctive, non-icteric sclera Ears, Nose, Mouth, Throat: Normal appearance externally Neck: No masses, trachea midline Respiratory: Increased respiratory rate, no accessory muscle use  Breath sounds normal, no wheeze/rhonchi/rales but poor inspiratory effort  Cardiovascular: S1/S2 normal, no murmur/rub/gallop auscultated No lower extremity edema Gastrointestinal: Nontender, no masses Musculoskeletal:  No clubbing/cyanosis of digits Neurological: No cranial nerve deficit on limited exam Psychiatric: Normal judgment/insight Anxious mood and affect       Scheduled Medications:   aspirin EC  81 mg Oral Daily   atorvastatin  80 mg Oral QHS   Chlorhexidine Gluconate Cloth  6 each Topical Daily   clopidogrel  75 mg Oral Daily   dextromethorphan-guaiFENesin  1 tablet Oral BID   feeding supplement  237 mL Oral TID BM   furosemide  40 mg Intravenous Q6H   heparin injection (subcutaneous)  5,000 Units Subcutaneous Q8H   insulin aspart  0-15 Units Subcutaneous Q4H   isosorbide mononitrate  15 mg Oral Daily   metoprolol succinate  12.5 mg Oral Daily   multivitamin with minerals  1 tablet Oral Daily   pantoprazole  40 mg Oral Daily    Continuous Infusions:  sodium chloride Stopped (01/15/22 0502)    PRN Medications:  acetaminophen, docusate sodium, ipratropium-albuterol, loperamide, morphine injection, nitroGLYCERIN, ondansetron (ZOFRAN) IV, mouth rinse, polyethylene glycol  Antimicrobials:  Anti-infectives (From admission, onward)    Start     Dose/Rate Route Frequency Ordered Stop   01/11/22 1200  piperacillin-tazobactam (ZOSYN) IVPB 3.375 g        3.375 g 12.5 mL/hr over 240 Minutes Intravenous Every 8 hours 01/11/22 1106 01/16/22 0149   01/11/22 0915  cefTRIAXone (ROCEPHIN) 1 g in sodium  chloride 0.9 % 100 mL IVPB  Status:  Discontinued        1 g 200 mL/hr over 30 Minutes Intravenous Every 24 hours 01/11/22 0903 01/11/22 1106       Data Reviewed: I have personally reviewed following labs and imaging studies  CBC: Recent Labs  Lab 01/13/22 0339 01/14/22 0412 01/15/22 0456 01/17/22 1153 01/18/22 0419  WBC 9.4 8.3 9.7 13.2* 11.7*  NEUTROABS  --   --  7.9*  --  8.3*  HGB 7.7* 7.4* 7.2* 8.1* 8.2*  HCT 24.2* 23.7* 23.1* 26.3* 26.4*  MCV 97.6 97.5 98.3 100.8* 96.7  PLT 312 281 264 361 [id number]   Basic Metabolic Panel: Recent Labs  Lab 01/12/22 0415 01/13/22 0339 01/14/22 0412 01/15/22 0456 01/16/22 0443 01/17/22 0330 01/18/22 0419  NA 135 134* 136 138 141 139 135  K 3.1* 3.6 4.3 3.7 3.1* 3.7 4.2  CL 100 103 102 102 104 103 99  CO2 28 25 27 28 27 27 24   GLUCOSE 158* 198* 165* 188* 168* 98 100*  BUN 46* 41* 35* 37* 48* 49* 52*  CREATININE 1.75* 1.42* 1.16* 1.31* 1.36* 1.20* 1.06*  CALCIUM 8.4* 7.9* 8.3* 7.9* 8.3* 8.5* 8.5*  MG 2.1 2.3 2.4 2.2  --   --   --  PHOS 4.2 3.6 3.6 3.9  --   --   --    GFR: Estimated Creatinine Clearance: 33.7 mL/min (A) (by C-G formula based on SCr of 1.06 mg/dL (H)). Liver Function Tests: Recent Labs  Lab 01/15/22 0456 01/18/22 0419  AST 16 21  ALT 17 16  ALKPHOS 65 56  BILITOT 0.7 0.8  PROT 5.8* 6.1*  ALBUMIN 2.6* 2.9*   No results for input(s): "LIPASE", "AMYLASE" in the last 168 hours. No results for input(s): "AMMONIA" in the last 168 hours. Coagulation Profile: No results for input(s): "INR", "PROTIME" in the last 168 hours.  Cardiac Enzymes: No results for input(s): "CKTOTAL", "CKMB", "CKMBINDEX", "TROPONINI" in the last 168 hours. BNP (last 3 results) No results for input(s): "PROBNP" in the last 8760 hours. HbA1C: No results for input(s): "HGBA1C" in the last 72 hours. CBG: Recent Labs  Lab 01/17/22 1952 01/17/22 2338 01/18/22 0336 01/18/22 0742 01/18/22 1155  GLUCAP 190* 103* 113* 116* 126*    Lipid Profile: Recent Labs    01/16/22 0443  TRIG 121   Thyroid Function Tests: No results for input(s): "TSH", "T4TOTAL", "FREET4", "T3FREE", "THYROIDAB" in the last 72 hours. Anemia Panel: No results for input(s): "VITAMINB12", "FOLATE", "FERRITIN", "TIBC", "IRON", "RETICCTPCT" in the last 72 hours. Urine analysis:    Component Value Date/Time   COLORURINE STRAW (A) 01/17/2022 1832   APPEARANCEUR CLEAR (A) 01/17/2022 1832   LABSPEC 1.006 01/17/2022 1832   PHURINE 5.0 01/17/2022 1832   GLUCOSEU NEGATIVE 01/17/2022 1832   HGBUR NEGATIVE 01/17/2022 1832   BILIRUBINUR NEGATIVE 01/17/2022 1832   KETONESUR NEGATIVE 01/17/2022 1832   PROTEINUR NEGATIVE 01/17/2022 1832   NITRITE NEGATIVE 01/17/2022 1832   LEUKOCYTESUR NEGATIVE 01/17/2022 1832   Sepsis Labs: @LABRCNTIP (procalcitonin:4,lacticidven:4)  Recent Results (from the past 240 hour(s))  Urine Culture     Status: Abnormal   Collection Time: 01/11/22  7:33 AM   Specimen: Urine, Random  Result Value Ref Range Status   Specimen Description   Final    URINE, RANDOM Performed at [facility], [address] [place], [place] [address]    Special Requests   Final    NONE Performed at [facility], [address] [place], [place] [address]    Culture >=100,000 COLONIES/mL ESCHERICHIA COLI (A)  Final   Report Status 01/13/2022 FINAL  Final   Organism ID, Bacteria ESCHERICHIA COLI (A)  Final      Susceptibility   Escherichia coli - MIC*    AMPICILLIN <=2 SENSITIVE Sensitive     CEFAZOLIN <=4 SENSITIVE Sensitive     CEFEPIME <=0.12 SENSITIVE Sensitive     CEFTRIAXONE <=0.25 SENSITIVE Sensitive     CIPROFLOXACIN <=0.25 SENSITIVE Sensitive     GENTAMICIN <=1 SENSITIVE Sensitive     IMIPENEM <=0.25 SENSITIVE Sensitive     NITROFURANTOIN <=16 SENSITIVE Sensitive     TRIMETH/SULFA <=20 SENSITIVE Sensitive     AMPICILLIN/SULBACTAM <=2 SENSITIVE Sensitive     PIP/TAZO <=4 SENSITIVE Sensitive     *  >=100,000 COLONIES/mL ESCHERICHIA COLI  SARS Coronavirus 2 by RT PCR (hospital order, performed in [facility] hospital lab) *cepheid single result test* Nasal Mucosa     Status: None   Collection Time: 01/11/22 11:00 AM   Specimen: Nasal Mucosa; Nasal Swab  Result Value Ref Range Status   SARS Coronavirus 2 by RT PCR NEGATIVE NEGATIVE Final    Comment: (NOTE) SARS-CoV-2 target nucleic acids are NOT DETECTED.  The SARS-CoV-2 RNA is generally detectable in upper  and lower respiratory specimens during the acute phase of infection. The lowest concentration of SARS-CoV-2 viral copies this assay can detect is 250 copies / mL. A negative result does not preclude SARS-CoV-2 infection and should not be used as the sole basis for treatment or other patient management decisions.  A negative result may occur with improper specimen collection / handling, submission of specimen other than nasopharyngeal swab, presence of viral mutation(s) within the areas targeted by this assay, and inadequate number of viral copies (<250 copies / mL). A negative result must be combined with clinical observations, patient history, and epidemiological information.  Fact Sheet for Patients:   https://www.patel.info/  Fact Sheet for Healthcare Providers: https://hall.com/  This test is not yet approved or  cleared by the [place] FDA and has been authorized for detection and/or diagnosis of SARS-CoV-2 by FDA under an Emergency Use Authorization (EUA).  This EUA will remain in effect (meaning this test can be used) for the duration of the COVID-19 declaration under Section 564(b)(1) of the Act, 21 U.S.C. section 360bbb-3(b)(1), unless the authorization is terminated or revoked sooner.  Performed at [facility], [address] [place], [place] [address]   MRSA Next Gen by PCR, Nasal     Status: None   Collection Time: 01/11/22 11:00 AM   Specimen: Nasal  Mucosa; Nasal Swab  Result Value Ref Range Status   MRSA by PCR Next Gen NOT DETECTED NOT DETECTED Final    Comment: (NOTE) The GeneXpert MRSA Assay (FDA approved for NASAL specimens only), is one component of a comprehensive MRSA colonization surveillance program. It is not intended to diagnose MRSA infection nor to guide or monitor treatment for MRSA infections. Test performance is not FDA approved in patients less than 48 years old. Performed at [facility], [address] [place], [place] [address]   Culture, Respiratory w Gram Stain     Status: None   Collection Time: 01/11/22  2:05 PM   Specimen: Tracheal Aspirate; Respiratory  Result Value Ref Range Status   Specimen Description   Final    TRACHEAL ASPIRATE Performed at [facility], [address] [place], [place] [address]    Special Requests   Final    NONE Performed at [facility], [address] [place], [place] [address]    Gram Stain   Final    FEW WBC PRESENT,BOTH PMN AND MONONUCLEAR FEW GRAM POSITIVE COCCI IN PAIRS    Culture   Final    RARE Normal respiratory flora-no Staph aureus or Pseudomonas seen Performed at [facility] [place] [address] [place], [place] [address]    Report Status 01/14/2022 FINAL  Final  Culture, blood (Routine X 2) w Reflex to ID Panel     Status: None (Preliminary result)   Collection Time: 01/17/22  6:00 PM   Specimen: BLOOD  Result Value Ref Range Status   Specimen Description BLOOD RIGHT ANTECUBITAL  Final   Special Requests   Final    BOTTLES DRAWN AEROBIC AND ANAEROBIC Blood Culture adequate volume   Culture   Final    NO GROWTH < 24 HOURS Performed at [facility], [address], [place], [place] [address]    Report Status PENDING  Incomplete  Culture, blood (Routine X 2) w Reflex to ID Panel     Status: None (Preliminary result)   Collection Time: 01/17/22  6:00 PM   Specimen: BLOOD  Result Value Ref Range Status  Specimen Description BLOOD BLOOD LEFT WRIST  Final   Special Requests   Final    BOTTLES DRAWN AEROBIC AND ANAEROBIC Blood Culture adequate volume   Culture   Final    NO GROWTH < 24 HOURS Performed at [facility], [address] [place], [place] [address]    Report Status PENDING  Incomplete  Respiratory (~20 pathogens) panel by PCR     Status: None   Collection Time: 01/17/22  6:32 PM   Specimen: Nasopharyngeal Swab; Respiratory  Result Value Ref Range Status   Adenovirus NOT DETECTED NOT DETECTED Final   Coronavirus 229E NOT DETECTED NOT DETECTED Final    Comment: (NOTE) The Coronavirus on the Respiratory Panel, DOES NOT test for the novel  Coronavirus (2019 nCoV)    Coronavirus HKU1 NOT DETECTED NOT DETECTED Final   Coronavirus NL63 NOT DETECTED NOT DETECTED Final   Coronavirus OC43 NOT DETECTED NOT DETECTED Final   Metapneumovirus NOT DETECTED NOT DETECTED Final   Rhinovirus / Enterovirus NOT DETECTED NOT DETECTED Final   Influenza A NOT DETECTED NOT DETECTED Final   Influenza B NOT DETECTED NOT DETECTED Final   Parainfluenza Virus 1 NOT DETECTED NOT DETECTED Final   Parainfluenza Virus 2 NOT DETECTED NOT DETECTED Final   Parainfluenza Virus 3 NOT DETECTED NOT DETECTED Final   Parainfluenza Virus 4 NOT DETECTED NOT DETECTED Final   Respiratory Syncytial Virus NOT DETECTED NOT DETECTED Final   Bordetella pertussis NOT DETECTED NOT DETECTED Final   Bordetella Parapertussis NOT DETECTED NOT DETECTED Final   Chlamydophila pneumoniae NOT DETECTED NOT DETECTED Final   Mycoplasma pneumoniae NOT DETECTED NOT DETECTED Final    Comment: Performed at [facility], [address] [place], [place] [address]         Radiology Studies last 96 hours: DG Chest Port 1 View  Result Date: 01/17/2022 CLINICAL DATA:  Fever, shortness of breath EXAM: PORTABLE CHEST 1 VIEW COMPARISON:  Previous studies including the examination of 01/15/2022 FINDINGS: Transverse diameter  of Edwyna Shell is increased. Central pulmonary vessels are prominent. Increased interstitial markings are seen in the parahilar regions. Small to moderate bilateral pleural effusions are seen, more so on the left side with interval increase. Evaluation of left lower lung field for infiltrates is limited by the effusion. There is no pneumothorax. There is interval removal of endotracheal tube, central venous catheter and enteric tube. IMPRESSION: Central pulmonary vessels are prominent with increased interstitial markings in the parahilar regions suggesting CHF. Small to moderate bilateral pleural effusions, more so on the left side with interval increase. Electronically Signed   By: Ernie Avena M.D.   On: 01/17/2022 17:38   DG Chest Port 1 View  Result Date: 01/15/2022 CLINICAL DATA:  Respiratory failure EXAM: PORTABLE CHEST 1 VIEW COMPARISON:  Prior chest x-ray 11/07/2041 FINDINGS: Endotracheal tube present. The tip is 4.7 cm above the carina. Left IJ approach central venous catheter with the catheter tip at the cavoatrial junction. A gastric tube is present. The proximal side-hole is visualized over the gastric bubble. Patient is status post median sternotomy with evidence of multivessel CABG. Sternal fixation hardware in place. Cardiac and mediastinal contours are within normal limits. Increased pulmonary vascular congestion now with mild interstitial edema. Probable small bilateral pleural effusions with associated bibasilar atelectasis. No pneumothorax. No acute osseous abnormality. IMPRESSION: 1. Stable and satisfactory support apparatus. 2. Increased pulmonary vascular congestion now with mild interstitial edema. 3. Small bilateral layering pleural effusions. Electronically Signed   By: Vilma Prader  Archer Asa M.D.   On: 01/15/2022 10:15            LOS: 7 days       Sunnie Nielsen, DO [facility] 01/18/2022, 1:30 PM   Staff may message me via secure chat in Epic  but this may not  receive immediate response,  please page for urgent matters!  If 7PM-7AM, please contact night-coverage [email or web address]  Dictation software was used to generate the above note. Typos may occur and escape review, as with typed/written notes. Please contact Dr Lyn Hollingshead directly for clarity if needed.

## 2022-01-18 NOTE — Progress Notes (Signed)
PT Cancellation Note  Patient Details Name: Lindsey Pollard MRN: [id number] DOB: Mar 05, 1941   Cancelled Treatment:    Reason Eval/Treat Not Completed: Medical issues which prohibited therapy  Pt was able to participate with OT earlier today and even stood for ~10 seconds.  However in speaking with nursing after lunch she has apparently been more fatigued since, despite good SpO2 had increased work of breathing, c/o chest pain needing nitro paste and both nurses requested to hold activity with PT today.  Will maintain on caseload and attempts to treat as appropriate.   Malachi Pro, DPT 01/18/2022, 3:51 PM

## 2022-01-19 DIAGNOSIS — I214 Non-ST elevation (NSTEMI) myocardial infarction: Secondary | ICD-10-CM | POA: Diagnosis not present

## 2022-01-19 LAB — GLUCOSE, CAPILLARY
Glucose-Capillary: 128 mg/dL — ABNORMAL HIGH (ref 70–99)
Glucose-Capillary: 123 mg/dL — ABNORMAL HIGH (ref 70–99)
Glucose-Capillary: 107 mg/dL — ABNORMAL HIGH (ref 70–99)
Glucose-Capillary: 141 mg/dL — ABNORMAL HIGH (ref 70–99)
Glucose-Capillary: 132 mg/dL — ABNORMAL HIGH (ref 70–99)
Glucose-Capillary: 98 mg/dL (ref 70–99)

## 2022-01-19 LAB — BASIC METABOLIC PANEL
Anion gap: 7 (ref 5–15)
BUN: 49 mg/dL — ABNORMAL HIGH (ref 8–23)
CO2: 30 mmol/L (ref 22–32)
Calcium: 8.3 mg/dL — ABNORMAL LOW (ref 8.9–10.3)
Chloride: 98 mmol/L (ref 98–111)
Creatinine, Ser: 1 mg/dL (ref 0.44–1.00)
GFR, Estimated: 57 mL/min — ABNORMAL LOW (ref >60)
Glucose, Bld: 105 mg/dL — ABNORMAL HIGH (ref 70–99)
Potassium: 3.4 mmol/L — ABNORMAL LOW (ref 3.5–5.1)
Sodium: 135 mmol/L (ref 135–145)

## 2022-01-19 MED ORDER — FUROSEMIDE 10 MG/ML IJ SOLN
40.0000 mg | Freq: Two times a day (BID) | INTRAMUSCULAR | Status: AC
  Administered 2022-01-19 (×2): 40 mg via INTRAVENOUS
  Filled 2022-01-19 (×2): qty 4

## 2022-01-19 MED ORDER — POTASSIUM CHLORIDE CRYS ER 20 MEQ PO TBCR
20.0000 meq | EXTENDED_RELEASE_TABLET | Freq: Once | ORAL | Status: AC
Start: 2022-01-19 — End: 2022-01-19
  Administered 2022-01-19: 20 meq via ORAL
  Filled 2022-01-19: qty 1

## 2022-01-19 NOTE — Progress Notes (Signed)
1440 Pt transferred in to room 220. Pt alert and oriented x4. On 2LNC. HOH. Active BS. Last BM today 7/28. Mepilex to sacrum and bilateral heels. Call bell and possessions within reach. Bed alarm on.

## 2022-01-19 NOTE — Progress Notes (Signed)
Lindsey Pollard NOTE       Patient ID: WETONA HOLLINGSWORTH MRN: [id number] DOB/AGE: [birth year] 81 y.o.  Admit date: 01/11/2022 Referring Physician Dr. Merlyn Lot Primary Physician Dr. [name]  Primary Cardiologist Dr. Clayborn Bigness Reason for Consultation NSTEMI  HPI: Lindsey Pollard is an 81yoF with a PMH of CAD s/p CABG x2 in 2018 (patent LIMA to LAD,  occluded SVG to OM1 by [facility] 05/2021), h/o PCI x1 distal RCA (50% ISR by [facility] 05/2021), ischemic CM with LVEF with moderate hypo of LV mid apical anterolateral wall, 55-60%, g1dd, mod MR 12/2021, CKD 3, hyperlipidemia, type 2 diabetes, CAD, GERD who was recently hospitalized at [facility] from 7/12 - 7/18 initially for shortness of breath and developed chest pain, VF arrest with successful resuscitation and her NSTEMI was treated medically.  She presents to [facility] ED 2 days after discharge with acute encephalopathy, nausea, vomiting, and was intubated for airway protection in the ED. cardiology was consulted due to her significant cardiac history and concern for NSTEMI. Successfully extubated 7/26 to 3L   Interval History:  -Felt short of breath with increased work of breathing this morning, currently receiving a nebulizer treatment during interview which patient says is helping -Diuresed well yesterday with IV Lasix, currently net even from a volume standpoint -Had pleuritic chest discomfort associated with coughing yesterday, denies her usual anginal equivalent   Past Medical History:  Diagnosis Date   Chest pain 12/06/2021   Diabetes mellitus without complication ([place])    Hyperlipidemia    Hypertension    Myocardial infarction [facility])     Past Surgical History:  Procedure Laterality Date   CARDIAC SURGERY     bypass   CORONARY/GRAFT ACUTE MI REVASCULARIZATION N/A 06/01/2021   Procedure: Coronary/Graft Acute MI Revascularization;  Surgeon: Yolonda Kida, MD;  Location: [place] [facility];  Service: Cardiovascular;   Laterality: N/A;   EYE SURGERY     cataract extraction   LEFT HEART CATH AND CORONARY ANGIOGRAPHY N/A 06/01/2021   Procedure: LEFT HEART CATH AND CORONARY ANGIOGRAPHY;  Surgeon: Yolonda Kida, MD;  Location: [place] [facility];  Service: Cardiovascular;  Laterality: N/A;    Medications Prior to Admission  Medication Sig Dispense Refill Last Dose   acetaminophen (TYLENOL) 325 MG tablet Take 650 mg by mouth at bedtime.   PRN at PRN   albuterol (VENTOLIN HFA) 108 (90 Base) MCG/ACT inhaler Inhale 1-2 puffs into the lungs every 4 (four) hours as needed for shortness of breath.      aspirin 81 MG EC tablet Take by mouth.      atorvastatin (LIPITOR) 80 MG tablet Take 80 mg by mouth every evening.      baclofen (LIORESAL) 10 MG tablet Take 10 mg by mouth 3 (three) times daily.      Cholecalciferol 25 MCG (1000 UT) capsule Take 1,000 Units by mouth 2 (two) times daily.      clopidogrel (PLAVIX) 75 MG tablet Take 1 tablet (75 mg total) by mouth daily. 30 tablet 0    feeding supplement (ENSURE ENLIVE / ENSURE PLUS) LIQD Take 237 mLs by mouth 3 (three) times daily between meals. 237 mL 12    insulin glargine (LANTUS) 100 UNIT/ML injection Inject 8 Units into the skin at bedtime.      insulin lispro (HUMALOG) 100 UNIT/ML KwikPen Inject 0-2 Units into the skin 3 (three) times daily.      ipratropium-albuterol (DUONEB) 0.5-2.5 (3) MG/3ML SOLN Take 3 mLs by nebulization  every 6 (six) hours as needed (shortness of breath).      isosorbide mononitrate (IMDUR) 30 MG 24 hr tablet Take 1 tablet (30 mg total) by mouth daily. 30 tablet 1    lisinopril (ZESTRIL) 20 MG tablet Take 1 tablet (20 mg total) by mouth daily. 30 tablet 1    lisinopril-hydrochlorothiazide (ZESTORETIC) 10-12.5 MG tablet Take 1 tablet by mouth daily.      metoprolol succinate (TOPROL-XL) 50 MG 24 hr tablet Take 50 mg by mouth daily.      Multiple Vitamin (MULTIVITAMIN WITH MINERALS) TABS tablet Take 1 tablet by mouth daily.       nitroGLYCERIN (NITROSTAT) 0.4 MG SL tablet Place 0.4 mg under the tongue every 5 (five) minutes as needed for chest pain.      ondansetron (ZOFRAN-ODT) 4 MG disintegrating tablet Take 4 mg by mouth every 8 (eight) hours as needed.      senna (SENOKOT) 8.6 MG tablet Take 2 tablets by mouth at bedtime.      SPIRIVA HANDIHALER 18 MCG inhalation capsule Place 1 capsule into inhaler and inhale daily as needed for shortness of breath.      spironolactone (ALDACTONE) 25 MG tablet Take 12.5 mg by mouth daily.      torsemide (DEMADEX) 20 MG tablet Take 20 mg by mouth daily.      Social History   Socioeconomic History   Marital status: Divorced    Spouse name: Not on file   Number of children: Not on file   Years of education: Not on file   Highest education level: Not on file  Occupational History   Not on file  Tobacco Use   Smoking status: Former    Types: Cigarettes   Smokeless tobacco: Never  Substance and Sexual Activity   Alcohol use: Not Currently   Drug use: Not Currently   Sexual activity: Not on file  Other Topics Concern   Not on file  Social History Narrative   Not on file   Social Determinants of Health   Financial Resource Strain: Not on file  Food Insecurity: Not on file  Transportation Needs: Not on file  Physical Activity: Not on file  Stress: Not on file  Social Connections: Not on file  Intimate Partner Violence: Not on file    Family History  Problem Relation Age of Onset   Congestive Heart Failure Mother    Diabetes Father    Breast cancer Neg Hx      PHYSICAL EXAM General: Elderly and chronically ill-appearing Caucasian female lying in incline in ICU bed receiving a nebulizer treatment with daughter and best friend at bedside HEENT:  Normocephalic and atraumatic. Neck:  No JVD.  Lungs: Short of breath appearing with bibasilar crackles Heart: HRRR . Normal S1 and S2 with 3/6 systolic murmur heard throughout.  Radial pulses nonpalpable Abdomen:  Non-distended appearing.  Msk: Normal strength and tone for age. Extremities: Warm without clubbing, cyanosis.  No peripheral edema.  Neuro: Awake and alert.  Psych: Anxious appearing, Answers questions appropriately.   Labs:   Lab Results  Component Value Date   WBC 11.7 (H) 01/18/2022   HGB 8.2 (L) 01/18/2022   HCT 26.4 (L) 01/18/2022   MCV 96.7 01/18/2022   PLT 351 01/18/2022    Recent Labs  Lab 01/18/22 0419 01/19/22 0511  NA 135 135  K 4.2 3.4*  CL 99 98  CO2 24 30  BUN 52* 49*  CREATININE 1.06* 1.00  CALCIUM 8.5* 8.3*  PROT 6.1*  --   BILITOT 0.8  --   ALKPHOS 56  --   ALT 16  --   AST 21  --   GLUCOSE 100* 105*    No results found for: "CKTOTAL", "CKMB", "CKMBINDEX", "TROPONINI"  Lab Results  Component Value Date   CHOL 102 12/07/2021   CHOL 127 06/01/2021   Lab Results  Component Value Date   HDL 31 (L) 12/07/2021   HDL 47 06/01/2021   Lab Results  Component Value Date   LDLCALC 45 12/07/2021   LDLCALC 49 06/01/2021   Lab Results  Component Value Date   TRIG 121 01/16/2022   TRIG 101 01/04/2022   TRIG 128 12/07/2021   Lab Results  Component Value Date   CHOLHDL 3.3 12/07/2021   CHOLHDL 2.7 06/01/2021   No results found for: "LDLDIRECT"    Radiology: [facility] Chest Port 1 View  Result Date: 01/17/2022 CLINICAL DATA:  Fever, shortness of breath EXAM: PORTABLE CHEST 1 VIEW COMPARISON:  Previous studies including the examination of 01/15/2022 FINDINGS: Transverse diameter of Edwyna Shell is increased. Central pulmonary vessels are prominent. Increased interstitial markings are seen in the parahilar regions. Small to moderate bilateral pleural effusions are seen, more so on the left side with interval increase. Evaluation of left lower lung field for infiltrates is limited by the effusion. There is no pneumothorax. There is interval removal of endotracheal tube, central venous catheter and enteric tube. IMPRESSION: Central pulmonary vessels are prominent with  increased interstitial markings in the parahilar regions suggesting CHF. Small to moderate bilateral pleural effusions, more so on the left side with interval increase. Electronically Signed   By: Ernie Avena M.D.   On: 01/17/2022 17:38   DG Chest Port 1 View  Result Date: 01/15/2022 CLINICAL DATA:  Respiratory failure EXAM: PORTABLE CHEST 1 VIEW COMPARISON:  Prior chest x-ray 11/07/2041 FINDINGS: Endotracheal tube present. The tip is 4.7 cm above the carina. Left IJ approach central venous catheter with the catheter tip at the cavoatrial junction. A gastric tube is present. The proximal side-hole is visualized over the gastric bubble. Patient is status post median sternotomy with evidence of multivessel CABG. Sternal fixation hardware in place. Cardiac and mediastinal contours are within normal limits. Increased pulmonary vascular congestion now with mild interstitial edema. Probable small bilateral pleural effusions with associated bibasilar atelectasis. No pneumothorax. No acute osseous abnormality. IMPRESSION: 1. Stable and satisfactory support apparatus. 2. Increased pulmonary vascular congestion now with mild interstitial edema. 3. Small bilateral layering pleural effusions. Electronically Signed   By: Malachy Moan M.D.   On: 01/15/2022 10:15   ECHOCARDIOGRAM LIMITED  Result Date: 01/12/2022    ECHOCARDIOGRAM LIMITED REPORT   Patient Name:   Lindsey Pollard [facility] Date of Exam: 01/12/2022 Medical Rec #:  [id number]     Height:       59.0 in Accession #:    [id number]    Weight:       140.4 lb Date of Birth:  March 31, 1941     BSA:          1.587 m Patient Age:    81 years      BP:           146/47 mmHg Patient Gender: F             HR:           51 bpm. Exam Location:  [facility] Procedure: Limited Echo, Cardiac Doppler, Color Doppler and Intracardiac  Opacification Agent Indications:     Elevated Troponin  History:         Patient has prior history of Echocardiogram examinations, most                   recent 01/04/2022.  Sonographer:     Sherrie Sport Referring Phys:  [id number] [place] Lindsey Pollard Diagnosing Phys: Yolonda Kida MD  Sonographer Comments: Suboptimal parasternal window and suboptimal apical window. IMPRESSIONS  1. Left ventricular ejection fraction, by estimation, is 45 to 50%. The left ventricle has mildly decreased function. The left ventricle demonstrates global hypokinesis. The left ventricular internal cavity size was mildly dilated. Left ventricular diastolic function could not be evaluated.  2. Right ventricular systolic function is normal.  3. Moderate pleural effusion in the left lateral region.  4. The mitral valve is normal in structure.  5. The aortic valve is normal in structure. Aortic valve regurgitation is not visualized. Conclusion(s)/Recommendation(s): Poor windows for evaluation of left ventricular function by transthoracic echocardiography. Would recommend an alternative means of evaluation. FINDINGS  Left Ventricle: Left ventricular ejection fraction, by estimation, is 45 to 50%. The left ventricle has mildly decreased function. The left ventricle demonstrates global hypokinesis. Definity contrast agent was given IV to delineate the left ventricular  endocardial borders. The left ventricular internal cavity size was mildly dilated. There is no left ventricular hypertrophy. Left ventricular diastolic function could not be evaluated. Right Ventricle: No increase in right ventricular wall thickness. Right ventricular systolic function is normal. Left Atrium: Left atrial size was normal in size. Right Atrium: Right atrial size was normal in size. Mitral Valve: The mitral valve is normal in structure. Tricuspid Valve: The tricuspid valve is normal in structure. Aortic Valve: The aortic valve is normal in structure. Aortic valve regurgitation is not visualized. Pulmonic Valve: The pulmonic valve was normal in structure. Aorta: The ascending aorta was not well visualized. IAS/Shunts:  No atrial level shunt detected by color flow Doppler. Additional Comments: There is a moderate pleural effusion in the left lateral region. LEFT VENTRICLE PLAX 2D LVIDd:         4.70 cm LVIDs:         3.40 cm LV PW:         1.10 cm LV IVS:        0.90 cm LVOT diam:     2.00 cm LVOT Area:     3.14 cm  LEFT ATRIUM         Index LA diam:    3.00 cm 1.89 cm/m   AORTA Ao Root diam: 2.90 cm TRICUSPID VALVE TR Peak grad:   28.9 mmHg TR Vmax:        269.00 cm/s  SHUNTS Systemic Diam: 2.00 cm Yolonda Kida MD Electronically signed by Yolonda Kida MD Signature Date/Time: 01/12/2022/2:52:11 PM    Final    DG Chest Port 1 View  Result Date: 01/11/2022 CLINICAL DATA:  Central line placement. EXAM: PORTABLE CHEST 1 VIEW COMPARISON:  Radiograph and CT earlier today. FINDINGS: New left internal jugular central venous catheter tip overlies the atrial caval junction. No pneumothorax. Stable endotracheal and enteric tubes. Stable heart size and mediastinal contours. Small bilateral pleural effusions, increasing on the left. No pulmonary edema or new airspace disease. IMPRESSION: 1. New left internal jugular central venous catheter with tip over the atrial caval junction. No pneumothorax. 2. Stable endotracheal and enteric tubes. 3. Small bilateral pleural effusions, increasing on the left. Electronically Signed  By: Keith Rake M.D.   On: 01/11/2022 18:41   CT HEAD WO CONTRAST (5MM)  Result Date: 01/11/2022 CLINICAL DATA:  Altered mental status EXAM: CT HEAD WITHOUT CONTRAST TECHNIQUE: Contiguous axial images were obtained from the base of the skull through the vertex without intravenous contrast. RADIATION DOSE REDUCTION: This exam was performed according to the departmental dose-optimization program which includes automated exposure control, adjustment of the mA and/or kV according to patient size and/or use of iterative reconstruction technique. COMPARISON:  01/03/2022 FINDINGS: Brain: No acute intracranial  findings are seen in noncontrast CT brain. There are no signs of bleeding within the cranium. Cortical sulci are prominent. Vascular: Unremarkable. Skull: Unremarkable Sinuses/Orbits: Unremarkable. Other: None. IMPRESSION: No acute intracranial findings are seen.  Atrophy. Electronically Signed   By: Elmer Picker M.D.   On: 01/11/2022 08:28   CT CHEST ABDOMEN PELVIS WO CONTRAST  Result Date: 01/11/2022 CLINICAL DATA:  Shortness of breath, altered mental status EXAM: CT CHEST, ABDOMEN AND PELVIS WITHOUT CONTRAST TECHNIQUE: Multidetector CT imaging of the chest, abdomen and pelvis was performed following the standard protocol without IV contrast. RADIATION DOSE REDUCTION: This exam was performed according to the departmental dose-optimization program which includes automated exposure control, adjustment of the mA and/or kV according to patient size and/or use of iterative reconstruction technique. COMPARISON:  01/03/2022 FINDINGS: CT CHEST FINDINGS Cardiovascular: Extensive coronary artery calcifications are seen. There is evidence of previous coronary bypass surgery. Scattered calcifications are seen in thoracic aorta and its major branches. Mediastinum/Nodes: Slightly enlarged lymph nodes are seen in mediastinum with no significant interval change. Tip of endotracheal tube is at the carina and should be pulled back 2-3 cm. Lungs/Pleura: Small patchy infiltrates are seen in left parahilar region and posterior lower lung fields. There is interval appearance of small bilateral pleural effusions. There is no pneumothorax. There is slight thickening of interlobular septi. Musculoskeletal: No acute findings are seen. CT ABDOMEN PELVIS FINDINGS Hepatobiliary: No focal abnormalities are seen in liver. There is no dilation of bile ducts. Gallbladder is distended, possibly due to fasting state. There is no wall thickening in gallbladder. Gallbladder stones are seen. Pancreas: No focal abnormalities are seen.  Diverticulum is noted along the inner margin of second portion of duodenum. Spleen: Unremarkable. Adrenals/Urinary Tract: Adrenals are unremarkable. Left kidney is much smaller than right. This may be a congenital variation or suggest renal artery stenosis or chronic pyelonephritis. There is no hydronephrosis. There are no renal or ureteral stones. Foley catheter is seen in the bladder. Stomach/Bowel: Tip of enteric tube is seen in the stomach. There is no dilation of small bowel loops. Appendix is unremarkable. There is no significant wall thickening in colon. Scattered diverticula are seen in the colon without signs of focal acute diverticulitis. Vascular/Lymphatic: Extensive arterial calcifications are seen. Reproductive: Coarse calcifications seen in pelvis may suggest calcified uterine fibroids. Other: There is no ascites or pneumoperitoneum. Musculoskeletal: Degenerative changes are noted in lumbar spine, particularly severe at L2-L3 and L3-L4 levels. This finding has not changed significantly. IMPRESSION: Tip of endotracheal tube is at the carina and should be pulled back 2-3 cm. There is interval appearance of small bilateral pleural effusions, more so on the left side. There is thickening of interlobular septi suggesting possible interstitial pulmonary edema. There are patchy densities in the left parahilar region and both lower lung fields which may be due to pulmonary edema or pneumonia. Extensive coronary artery calcifications are seen. There is no evidence of intestinal obstruction or pneumoperitoneum. There  is no hydronephrosis. Gallbladder stones. Diverticulosis of colon without signs of diverticulitis. Other findings as described in the body of the report. Electronically Signed   By: Elmer Picker M.D.   On: 01/11/2022 08:27   DG Abd Portable 1 View  Result Date: 01/11/2022 CLINICAL DATA:  OG tube placement. EXAM: PORTABLE ABDOMEN - 1 VIEW COMPARISON:  AP chest 01/11/2022 at 704 hours  FINDINGS: New orogastric tube with side port overlying the proximal stomach and tip overlying the mid aspect of the greater curvature. Nonobstructed upper abdominal bowel-gas pattern. No portal venous gas or pneumatosis. Severe left L3-4 disc space narrowing and moderate to severe diffuse L4-5 disc space narrowing. Mild dextrocurvature centered at L3-4. IMPRESSION: Orogastric tube in appropriate position. Electronically Signed   By: Yvonne Kendall M.D.   On: 01/11/2022 08:11   DG Chest Portable 1 View  Result Date: 01/11/2022 CLINICAL DATA:  Tube placement.  Encounter for intubation. EXAM: PORTABLE CHEST 1 VIEW COMPARISON:  AP chest 01/11/2022 at 704 hours FINDINGS: AP chest 01/11/2022 at 735 hours. Interval placement of endotracheal tube with tip terminating imaging of the thoracic inlet and the carina proximally 3.2 cm above the carina. Enteric tube descends below the diaphragm with the side port overlying the proximal stomach and the tip excluded by inferior collimation, new from prior. Postsurgical changes are again seen of median sternotomy and CABG. Cardiac silhouette and mediastinal contours are within normal limits. Moderate calcification within the aortic arch. Mild left basilar linear likely subsegmental atelectasis. No definite pleural effusion. No pneumothorax is seen. No acute skeletal abnormality. IMPRESSION: 1. New endotracheal tube and enteric tube in appropriate position. 2. No acute lung process. 3.  Aortic Atherosclerosis (ICD10-I70.0). Electronically Signed   By: Yvonne Kendall M.D.   On: 01/11/2022 08:10   DG Chest Portable 1 View  Result Date: 01/11/2022 CLINICAL DATA:  Provided history: New onset dyspnea, recent cardiac event. EXAM: PORTABLE CHEST 1 VIEW COMPARISON:  Prior chest radiographs 01/04/2022 and earlier. FINDINGS: Prior median sternotomy/CABG. Heart size within normal limits. Aortic atherosclerosis. No appreciable airspace consolidation or pulmonary edema. No evidence of  pleural effusion or pneumothorax. No acute bony abnormality identified. IMPRESSION: No evidence of acute cardiopulmonary abnormality. Aortic Atherosclerosis (ICD10-I70.0). Electronically Signed   By: Kellie Simmering D.O.   On: 01/11/2022 08:03   ECHOCARDIOGRAM LIMITED  Result Date: 01/04/2022    ECHOCARDIOGRAM LIMITED REPORT   Patient Name:   Lindsey Pollard [facility] Date of Exam: 01/04/2022 Medical Rec #:  [id number]     Height:       59.0 in Accession #:    [id number]    Weight:       127.4 lb Date of Birth:  03/16/1941     BSA:          1.523 m Patient Age:    56 years      BP:           128/50 mmHg Patient Gender: F             HR:           54 bpm. Exam Location:  [facility] Procedure: Limited Echo, Color Doppler and Cardiac Doppler Indications:     Cardiac Areest I46.9  History:         Patient has prior history of Echocardiogram examinations, most                  recent 12/07/2021. Previous Myocardial Infarction,  Signs/Symptoms:Chest Pain; Risk Factors:Diabetes and                  Hypertension.  Sonographer:     Sherrie Sport Referring Phys:  [id number] Lindsey Pollard Diagnosing Phys: Serafina Royals MD  Sonographer Comments: Image acquisition challenging due to patient behavioral factors. IMPRESSIONS  1. The left ventricle demonstrates regional wall motion abnormalities (see scoring diagram/findings for description).  2. Right ventricular systolic function is normal. The right ventricular size is normal.  3. Left atrial size was moderately dilated.  4. The mitral valve is normal in structure. Moderate mitral valve regurgitation.  5. Tricuspid valve regurgitation is mild to moderate.  6. The aortic valve is normal in structure. Aortic valve regurgitation is mild. FINDINGS  Left Ventricle: The left ventricle demonstrates regional wall motion abnormalities. Moderate hypokinesis of the left ventricular, mid-apical anterolateral wall. Right Ventricle: The right ventricular size is normal. No increase in right  ventricular wall thickness. Right ventricular systolic function is normal. Left Atrium: Left atrial size was moderately dilated. Right Atrium: Right atrial size was normal in size. Pericardium: There is no evidence of pericardial effusion. Mitral Valve: The mitral valve is normal in structure. Moderate mitral valve regurgitation. Tricuspid Valve: The tricuspid valve is normal in structure. Tricuspid valve regurgitation is mild to moderate. Aortic Valve: The aortic valve is normal in structure. Aortic valve regurgitation is mild. Pulmonic Valve: The pulmonic valve was normal in structure. Pulmonic valve regurgitation is trivial. Aorta: The aortic root and ascending aorta are structurally normal, with no evidence of dilitation. IAS/Shunts: No atrial level shunt detected by color flow Doppler. LEFT VENTRICLE PLAX 2D LVIDd:         4.40 cm LVIDs:         3.20 cm LV PW:         1.10 cm LV IVS:        1.00 cm LVOT diam:     2.00 cm LVOT Area:     3.14 cm  LEFT ATRIUM         Index LA diam:    3.40 cm 2.23 cm/m   AORTA Ao Root diam: 2.90 cm  SHUNTS Systemic Diam: 2.00 cm Serafina Royals MD Electronically signed by Serafina Royals MD Signature Date/Time: 01/04/2022/4:56:35 PM    Final    DG Chest 1 View  Result Date: 01/04/2022 CLINICAL DATA:  81 year old female intubated.  Post CPR. EXAM: CHEST  1 VIEW COMPARISON:  Portable chest 01/03/2022 and earlier. FINDINGS: Portable AP semi upright view at 0611 hours. Endotracheal tube tip partially obscured by sternotomy hardware, but appears to be in good position between the level the clavicles and carina. Enteric tube courses into the abdomen, tip is not included. Stable pacer or resuscitation pads over the left chest. Stable lung volumes. Mediastinal contours remain normal. Allowing for portable technique the lungs are clear. No pneumothorax or pleural effusion identified. Negative visible bowel gas. Stable visualized osseous structures. IMPRESSION: 1. Satisfactory ET tube  position. Enteric tube courses to the abdomen, tip not included. 2. No acute cardiopulmonary abnormality. Electronically Signed   By: Genevie Ann M.D.   On: 01/04/2022 07:26   DG Chest Portable 1 View  Result Date: 01/03/2022 CLINICAL DATA:  Intubation, post CPR EXAM: PORTABLE CHEST 1 VIEW COMPARISON:  01/03/2022 FINDINGS: Endotracheal tube is in the right mainstem bronchus. Recommend retracting approximately 5 cm. NG tube enters the stomach. Prior CABG. Heart is normal size. Increasing airspace disease throughout the left lung which may  reflect atelectasis due to right mainstem intubation. Similar right upper lobe opacity/atelectasis. No effusions. IMPRESSION: Right mainstem intubation. Recommend retracting endotracheal tube approximately 5 cm. Areas of airspace disease in the left lung and right upper lobe may be related to atelectasis from right mainstem intubation. These results were called by telephone at the time of interpretation on 01/03/2022 at 10:21 pm to provider [facility] , who verbally acknowledged these results. Electronically Signed   By: Rolm Baptise M.D.   On: 01/03/2022 22:24   CT Cervical Spine Wo Contrast  Result Date: 01/03/2022 CLINICAL DATA:  Neck trauma (Age >= 65y) EXAM: CT CERVICAL SPINE WITHOUT CONTRAST TECHNIQUE: Multidetector CT imaging of the cervical spine was performed without intravenous contrast. Multiplanar CT image reconstructions were also generated. RADIATION DOSE REDUCTION: This exam was performed according to the departmental dose-optimization program which includes automated exposure control, adjustment of the mA and/or kV according to patient size and/or use of iterative reconstruction technique. COMPARISON:  None Available. FINDINGS: Alignment: Normal Skull base and vertebrae: No acute fracture. No primary bone lesion or focal pathologic process. Soft tissues and spinal canal: No prevertebral fluid or swelling. No visible canal hematoma. Disc levels: Degenerative disc  disease at C4-5 through C6-7 with disc space narrowing and spurring. Mild bilateral degenerative facet disease. Upper chest: No acute findings Other: None IMPRESSION: Degenerative disc and facet disease.  No acute bony abnormality. Electronically Signed   By: Rolm Baptise M.D.   On: 01/03/2022 20:10   CT HEAD WO CONTRAST (5MM)  Result Date: 01/03/2022 CLINICAL DATA:  Head trauma, minor (Age >= 65y) EXAM: CT HEAD WITHOUT CONTRAST TECHNIQUE: Contiguous axial images were obtained from the base of the skull through the vertex without intravenous contrast. RADIATION DOSE REDUCTION: This exam was performed according to the departmental dose-optimization program which includes automated exposure control, adjustment of the mA and/or kV according to patient size and/or use of iterative reconstruction technique. COMPARISON:  10/03/2021 FINDINGS: Brain: No acute intracranial abnormality. Specifically, no hemorrhage, hydrocephalus, mass lesion, acute infarction, or significant intracranial injury. Vascular: No hyperdense vessel or unexpected calcification. Skull: No acute calvarial abnormality. Sinuses/Orbits: No acute findings Other: None IMPRESSION: Normal study for patient's age. Electronically Signed   By: Rolm Baptise M.D.   On: 01/03/2022 20:08   CT CHEST ABDOMEN PELVIS WO CONTRAST  Result Date: 01/03/2022 CLINICAL DATA:  Pneumonia, complication suspected, xray done. Shortness of breath. EXAM: CT CHEST, ABDOMEN AND PELVIS WITHOUT CONTRAST TECHNIQUE: Multidetector CT imaging of the chest, abdomen and pelvis was performed following the standard protocol without IV contrast. RADIATION DOSE REDUCTION: This exam was performed according to the departmental dose-optimization program which includes automated exposure control, adjustment of the mA and/or kV according to patient size and/or use of iterative reconstruction technique. COMPARISON:  None Available. FINDINGS: CT CHEST FINDINGS Cardiovascular: Heavily calcified  aorta and coronary arteries. Prior CABG. Heart is normal size. Aorta is normal caliber. Mediastinum/Nodes: No mediastinal, hilar, or axillary adenopathy. Trachea and esophagus are unremarkable. Thyroid unremarkable. Lungs/Pleura: Lungs are clear. No focal airspace opacities or suspicious nodules. No effusions. Musculoskeletal: Chest wall soft tissues are unremarkable. No acute bony abnormality. CT ABDOMEN PELVIS FINDINGS Hepatobiliary: Small gallstone within the gallbladder. No focal hepatic abnormality. Pancreas: No focal abnormality or ductal dilatation. Spleen: No focal abnormality.  Normal size. Adrenals/Urinary Tract: Left kidney is atrophic. No renal or adrenal mass. No stones or hydronephrosis. Urinary bladder unremarkable. Stomach/Bowel: Left colonic diverticulosis. No active diverticulitis. Stomach and small bowel decompressed, unremarkable. Vascular/Lymphatic: Heavily calcified  aorta. No evidence of aneurysm or adenopathy. Reproductive: Calcified fibroids in the uterus.  No adnexal masses. Other: No free fluid or free air. Musculoskeletal: No acute bony abnormality. Scoliosis and degenerative changes in the lumbar spine. IMPRESSION: No acute cardiopulmonary disease. Diffuse aortoiliac atherosclerosis.  No aneurysm. Cholelithiasis. Left colonic diverticulosis.  No active diverticulitis. No acute findings in the abdomen or pelvis. Electronically Signed   By: Charlett Nose M.D.   On: 01/03/2022 20:05   DG Chest 2 View  Result Date: 01/03/2022 CLINICAL DATA:  Shortness of breath EXAM: CHEST - 2 VIEW COMPARISON:  12/06/2021 FINDINGS: Prior CABG. Heart and mediastinal contours are within normal limits. No focal opacities or effusions. No acute bony abnormality. IMPRESSION: No active cardiopulmonary disease. Electronically Signed   By: Charlett Nose M.D.   On: 01/03/2022 19:25    ECHO 01/04/2022  1. The left ventricle demonstrates regional wall motion abnormalities  (see scoring diagram/findings for  description).   2. Right ventricular systolic function is normal. The right ventricular  size is normal.   3. Left atrial size was moderately dilated.   4. The mitral valve is normal in structure. Moderate mitral valve  regurgitation.   5. Tricuspid valve regurgitation is mild to moderate.   6. The aortic valve is normal in structure. Aortic valve regurgitation is  mild.   FINDINGS   Left Ventricle: The left ventricle demonstrates regional wall motion  abnormalities. Moderate hypokinesis of the left ventricular, mid-apical  anterolateral wall.   Right Ventricle: The right ventricular size is normal. No increase in  right ventricular wall thickness. Right ventricular systolic function is  normal.   Left Atrium: Left atrial size was moderately dilated.   Right Atrium: Right atrial size was normal in size.   Pericardium: There is no evidence of pericardial effusion.   Mitral Valve: The mitral valve is normal in structure. Moderate mitral  valve regurgitation.   Tricuspid Valve: The tricuspid valve is normal in structure. Tricuspid  valve regurgitation is mild to moderate.   Aortic Valve: The aortic valve is normal in structure. Aortic valve  regurgitation is mild.   Pulmonic Valve: The pulmonic valve was normal in structure. Pulmonic valve  regurgitation is trivial.   Aorta: The aortic root and ascending aorta are structurally normal, with  no evidence of dilitation.   IAS/Shunts: No atrial level shunt detected by color flow Doppler.   12/07/2021  1. Left ventricular ejection fraction, by estimation, is 55 to 60%. The  left ventricle has normal function. The left ventricle has no regional  wall motion abnormalities. Left ventricular diastolic parameters are  consistent with Grade I diastolic  dysfunction (impaired relaxation).   2. Right ventricular systolic function is normal. The right ventricular  size is normal.   3. The mitral valve is normal in structure. Moderate  mitral valve  regurgitation.   4. The aortic valve is normal in structure. Aortic valve regurgitation is  mild.   FINDINGS   Left Ventricle: Left ventricular ejection fraction, by estimation, is 55  to 60%. The left ventricle has normal function. The left ventricle has no  regional wall motion abnormalities. The left ventricular internal cavity  size was normal in size. There is   no left ventricular hypertrophy. Left ventricular diastolic parameters  are consistent with Grade I diastolic dysfunction (impaired relaxation).   Right Ventricle: The right ventricular size is normal. No increase in  right ventricular wall thickness. Right ventricular systolic function is  normal.   Left  Atrium: Left atrial size was normal in size.   Right Atrium: Right atrial size was normal in size.   Pericardium: There is no evidence of pericardial effusion.   Mitral Valve: The mitral valve is normal in structure. Moderate mitral  valve regurgitation. MV peak gradient, 4.8 mmHg. The mean mitral valve  gradient is 1.0 mmHg.   Tricuspid Valve: The tricuspid valve is grossly normal. Tricuspid valve  regurgitation is mild.   Aortic Valve: The aortic valve is normal in structure. Aortic valve  regurgitation is mild. Aortic regurgitation PHT measures 567 msec. Aortic  valve mean gradient measures 10.0 mmHg. Aortic valve peak gradient  measures 16.9 mmHg. Aortic valve area, by VTI   measures 1.12 cm.   Pulmonic Valve: The pulmonic valve was normal in structure. Pulmonic valve  regurgitation is not visualized.   Aorta: The ascending aorta was not well visualized.   IAS/Shunts: No atrial level shunt detected by color flow Doppler.   TELEMETRY reviewed by me: sinus rhythm rate 70s, multiple short runs of NSVT this morning.  EKG reviewed by me: sinus bradycardia 57, lateral ST depressions V5-6, improving on repeat. LBBB  ASSESSMENT AND PLAN:  Lindsey Pollard is an 7yoF with a PMH of CAD s/p CABG x2 in  2018 (patent LIMA to LAD,  occluded SVG to OM1 by [facility] 05/2021), h/o PCI x1 distal RCA (50% ISR by [facility] 05/2021), ischemic CM with LVEF with moderate hypo of LV mid apical anterolateral wall, 55-60%, g1dd, mod MR 12/2021, CKD 3, hyperlipidemia, type 2 diabetes, CAD, GERD who was recently hospitalized at [facility] from 7/12 - 7/18 initially for shortness of breath and developed chest pain, VF arrest with successful resuscitation and her NSTEMI was treated medically.  She presents to [facility] ED 2 days after discharge with acute encephalopathy, nausea, vomiting, and was intubated for airway protection in the ED. cardiology was consulted due to her significant cardiac history and concern for NSTEMI. Successfully extubated 7/26 to 3L   #Acute encephalopathy, resolved #recent NSTEMI 01/04/2022 - h/o significant CAD s/p CABG x2 with known occluded SVG to OM1, LIMA to LAD patent by [facility] 05/2021 #Ischemic cardiomyopathy #Acute hypoxic respiratory failure Patient has a complicated and significant history of CAD with multiple admissions over the past year for chest pain and has been medically managed due to patient preference.  She was most recently hospitalized last week with chest pain, VF arrest with ROSC requiring intubation.  Initial EKG showed significant lateral ST depressions and echo showed a new LV mid apical anterolateral hypokinesis.  Her NSTEMI was ultimately treated medically.  She presents again 2 days after discharge with confusion and acting strangely with multiple episodes of emesis.  Her troponin is downtrending from last admission at 2206-1700 (compared to a peak of 6300 previously). Her EKG shows lateral ST depressions and LBBB, improving on repeats. UA is positive for leukocytes she is being treated for possible aspiration pneumonia.  -successfully extubated 7/25 and has been off vasopressor support  -Agree with current therapy per primary team -s/p 325 mg aspirin, continue DAPT with aspirin 81 mg and 75 mg  clopidogrel daily. -S/p heparin drip for 48 hours, ended 7/22 -continue nitropaste 0.5inch q6h as needed for chest pain -Continue atorvastatin 80 mg nightly -s/p lasix 40mg  IV x4 doses with great output, volume status net even today -give another 40mg  IV lasix twice daily today -continue low dose metoprolol XL 12.5mg  and isosorbide 15 mg daily. -Hold torsemide 20 mg, spironolactone 12.5 mg, lisinopril 20 mg -  Limited echo resulted with an EF of 45 to 50% with global hypokinesis, LV dilation  Goals of care: Patient expressed to her daughter that she wishes to "live her remaining days with her (in [place]) and be comfortable."  The patient told me today that she does not wish to return to the hospital and she understands that she has severe coronary disease and will likely live with chronic angina.  We have discussed the significant risks and unlikely significant benefits of further heart catheterizations and she understands this. I believe it is reasonable to engage hospice at this point and I have relayed this conversation to the hospitalist who plans to reach out to [facility] today.  This patient's plan of care was discussed and created with Dr. Darrold Junker and he is in agreement.  Signed: Rebeca Allegra , PA-C 01/19/2022, 9:51 AM [facility]

## 2022-01-19 NOTE — Progress Notes (Signed)
Nutrition Follow Up Note   DOCUMENTATION CODES:   Non-severe (moderate) malnutrition in context of chronic illness  INTERVENTION:   Ensure Enlive po TID, each supplement provides 350 kcal and 20 grams of protein.  Magic cup TID with meals, each supplement provides 290 kcal and 9 grams of protein  MVI po daily   If NGT placed, recommend:  Osmolite 1.2@60ml/hr- Initiate at 30ml/hr and advance by 10ml/hr q 8 hours until goal rate is reached.   Free water flushes 30ml q4 hours to maintain tube patency   Regimen provides 1728kcal/day, 80g/day protein and 1361ml/day of free water.   Pt at high refeed risk; recommend monitor potassium, magnesium and phosphorus labs daily until stable  NUTRITION DIAGNOSIS:   Moderate Malnutrition related to chronic illness as evidenced by moderate muscle depletion, moderate fat depletion.  GOAL:   Patient will meet greater than or equal to 90% of their needs -not met   MONITOR:   PO intake, Supplement acceptance, Labs, Weight trends, Skin, I & O's  ASSESSMENT:   81 y/o female with h/o CAD s/p CABG x 2, PVD, GERD, CKD III, MI, DM, CHF, HTN, HLD and recent admission for VFib cardiac arrest and who is now admitted with acute metabolic encephalopathy, acute respiratory failure in the setting of acute on chronic HFpEF, elevated troponins (NSTEMI versus demand ischemia), UTI and presumed aspiration requiring intubation for airway protection.  Spoke with RN, pt eating <25% of meals in hospital and is not drinking much of her Ensure supplements. Recommend consideration of NGT placement and nutritional support until pt's oral intake improves; this was discussed with MD. Pt is asking to go home and just "be comfortable". MD to follow up with pt's GOC and will discuss feeding tube. Pt is at high refeed risk. Plan is for SNF at discharge.   Medications reviewed and include: aspirin, plavix, lasix, heparin, insulin, protonix  Labs reviewed: K 3.7 wnl, BUN  49(H), creat 1.20(H) P 3.9 wnl, Mg 2.2 wnl- 7/24 Wbc- 13.2(H), Hgb 8.1(L), Hct 26.3(L) Cbgs- 102, 99, 97 x 24 hrs  Diet Order:   Diet Order             Diet regular Room service appropriate? Yes; Fluid consistency: Thin  Diet effective now                  EDUCATION NEEDS:   No education needs have been identified at this time  Skin:  Skin Assessment: Reviewed RN Assessment (ecchymosis)  Last BM:  7/28- type 6  Height:   Ht Readings from Last 1 Encounters:  01/15/22 4' 11.02" (1.499 m)    Weight:   Wt Readings from Last 1 Encounters:  01/19/22 60.8 kg    Ideal Body Weight:  44.5 kg  BMI:  Body mass index is 27.06 kg/m.  Estimated Nutritional Needs:   Kcal:  1500-1700kcal/day  Protein:  75-85g/day  Fluid:  1.2-1.4L/day  Casey Campbell MS, RD, LDN Please refer to AMION for RD and/or RD on-call/weekend/after hours pager 

## 2022-01-19 NOTE — NC FL2 (Signed)
[place] MEDICAID FL2 LEVEL OF CARE SCREENING TOOL     IDENTIFICATION  Patient Name: Lindsey Pollard Birthdate: 1940/10/07 Sex: female Admission Date (Current Location): 01/11/2022  [facility] and [place] Number:  Chiropodist and Address:  [facility], [address], [place], [place] [address]      Provider Number: [id number]  Attending Physician Name and Address:  Sunnie Nielsen, DO  Relative Name and Phone Number:  Loleta Books (nephew) [phone]    Current Level of Care: Hospital Recommended Level of Care: Skilled Nursing Facility Prior Approval Number:    Date Approved/Denied:   PASRR Number: [id number]  Discharge Plan: SNF    Current Diagnoses: Patient Active Problem List   Diagnosis Date Noted   Fever and chills 01/17/2022   Malnutrition of moderate degree 01/11/2022   Overweight (BMI 25.0-29.9) 01/06/2022   Cardiac arrest with successful resuscitation (HCC) 01/03/2022   Unstable angina (HCC) 12/07/2021   NSTEMI (non-ST elevated myocardial infarction) (HCC) 12/06/2021   Chronic kidney disease, stage 3a (HCC) 12/06/2021   Lung nodule 12/06/2021   Type II diabetes mellitus with renal manifestations (HCC) 12/06/2021   HTN (hypertension) 12/06/2021   HLD (hyperlipidemia) 12/06/2021   AKI (acute kidney injury) (HCC) in the setting of stage IIIa chronic    Anemia    Acute respiratory failure with hypoxia (HCC)    Chronic diastolic CHF (congestive heart failure) (HCC)    Fungal infection of the groin    Fungal infection of skin    Essential hypertension 06/01/2021   Carotid stenosis 10/26/2020   Atherosclerosis of native arteries of extremity with intermittent claudication (HCC) 09/16/2020   Carotid bruit present 09/16/2020   Healthcare maintenance 01/06/2019   Aortic calcification (HCC) 06/19/2017   Ischemic cardiomyopathy 06/19/2017   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/17/2017   PVD (peripheral vascular disease) (HCC) 06/14/2017    Pseudophakia of right eye 11/10/2014   Bilateral hearing loss 10/05/2014   CAD (coronary artery disease) 10/05/2014   GERD (gastroesophageal reflux disease) 10/05/2014   RLS (restless legs syndrome) 09/09/2014   Mixed hyperlipidemia 08/27/2014   Type 2 diabetes mellitus with hyperlipidemia (HCC) 08/27/2014    Orientation RESPIRATION BLADDER Height & Weight     Self, Time, Situation, Place  O2 External catheter Weight: 60.8 kg Height:  4' 11.02" (149.9 cm)  BEHAVIORAL SYMPTOMS/MOOD NEUROLOGICAL BOWEL NUTRITION STATUS      Incontinent Diet (see discharge summary)  AMBULATORY STATUS COMMUNICATION OF NEEDS Skin   Extensive Assist Verbally Normal                       Personal Care Assistance Level of Assistance  Bathing, Feeding, Dressing Bathing Assistance: Maximum assistance Feeding assistance: Limited assistance Dressing Assistance: Maximum assistance     Functional Limitations Info  Sight, Hearing, Speech Sight Info: Adequate Hearing Info: Impaired Speech Info: Adequate    SPECIAL CARE FACTORS FREQUENCY  PT (By licensed PT), OT (By licensed OT)     PT Frequency: 5 times per week OT Frequency: 5 times per week            Contractures Contractures Info: Not present    Additional Factors Info  Code Status, Allergies Code Status Info: DNR Allergies Info: Tetanus Toxoids, PCN G           Current Medications (01/19/2022):  This is the current hospital active medication list Current Facility-Administered Medications  Medication Dose Route Frequency Provider Last Rate Last Admin   0.9 %  sodium chloride infusion  250 mL Intravenous Continuous Erin Fulling, MD   Stopped at 01/15/22 0502   acetaminophen (TYLENOL) tablet 650 mg  650 mg Oral Q4H PRN Judithe Modest, NP       aspirin EC tablet 81 mg  81 mg Oral Daily Tressie Ellis, RPH   81 mg at 01/19/22 3419   atorvastatin (LIPITOR) tablet 80 mg  80 mg Oral QHS Rebeca Allegra, PA-C   80 mg at 01/18/22  2012   Chlorhexidine Gluconate Cloth 2 % PADS 6 each  6 each Topical Daily Erin Fulling, MD   6 each at 01/19/22 0918   clopidogrel (PLAVIX) tablet 75 mg  75 mg Oral Daily Tressie Ellis, RPH   75 mg at 01/19/22 3790   dextromethorphan-guaiFENesin (MUCINEX DM) 30-600 MG per 12 hr tablet 1 tablet  1 tablet Oral BID Sunnie Nielsen, DO   1 tablet at 01/19/22 2409   docusate sodium (COLACE) capsule 100 mg  100 mg Oral BID PRN Tressie Ellis, RPH       enoxaparin (LOVENOX) injection 40 mg  40 mg Subcutaneous Q24H Sunnie Nielsen, DO   40 mg at 01/18/22 2013   feeding supplement (ENSURE ENLIVE / ENSURE PLUS) liquid 237 mL  237 mL Oral TID BM Sunnie Nielsen, DO   237 mL at 01/19/22 0918   furosemide (LASIX) injection 40 mg  40 mg Intravenous BID Rebeca Allegra, PA-C   40 mg at 01/19/22 0911   insulin aspart (novoLOG) injection 0-15 Units  0-15 Units Subcutaneous Q4H Harlon Ditty D, NP   2 Units at 01/19/22 7353   ipratropium-albuterol (DUONEB) 0.5-2.5 (3) MG/3ML nebulizer solution 3 mL  3 mL Nebulization Q6H PRN Sunnie Nielsen, DO   3 mL at 01/19/22 0913   isosorbide mononitrate (IMDUR) 24 hr tablet 15 mg  15 mg Oral Daily Rebeca Allegra, PA-C   15 mg at 01/19/22 2992   loperamide (IMODIUM) capsule 2 mg  2 mg Oral PRN Sunnie Nielsen, DO   2 mg at 01/17/22 1337   metoprolol succinate (TOPROL-XL) 24 hr tablet 12.5 mg  12.5 mg Oral Daily Rebeca Allegra, PA-C   12.5 mg at 01/19/22 4268   morphine (PF) 2 MG/ML injection 1-2 mg  1-2 mg Intravenous Q2H PRN Erin Fulling, MD   1 mg at 01/16/22 1655   multivitamin with minerals tablet 1 tablet  1 tablet Oral Daily Sunnie Nielsen, DO   1 tablet at 01/19/22 0918   nitroGLYCERIN (NITROGLYN) 2 % ointment 0.5 inch  0.5 inch Topical Q6H PRN Rebeca Allegra, PA-C   0.5 inch at 01/19/22 0402   ondansetron (ZOFRAN) injection 4 mg  4 mg Intravenous Q6H PRN Judithe Modest, NP       Oral care mouth rinse  15 mL Mouth Rinse PRN  Erin Fulling, MD       pantoprazole (PROTONIX) EC tablet 40 mg  40 mg Oral Daily Tressie Ellis, RPH   40 mg at 01/19/22 3419   polyethylene glycol (MIRALAX / GLYCOLAX) packet 17 g  17 g Oral Daily PRN Tressie Ellis, [facility]         Discharge Medications: Please see discharge summary for a list of discharge medications.  Relevant Imaging Results:  Relevant Lab Results:   Additional Information SS #: [id number]  Allayne Butcher, RN

## 2022-01-19 NOTE — Progress Notes (Signed)
PROGRESS NOTE    Lindsey Pollard  [id number] DOB: 1940/11/30  DOA: 01/11/2022 Date of Service: 01/19/22 PCP: [name], MD     Brief Narrative / Hospital Course:  81 year old female with a past medical history significant for recent V-fib arrest, HFpEF, ischemic cardiomyopathy, CAD status post CABG x2 in 2018 and PCI, chronic kidney disease stage III, diabetes mellitus who presents to [facility] ED on 01/11/2022 due to altered mental status, respiratory distress, and nausea/vomiting. (Of note: Was recently admitted from 7/12 through 7/18 for brief cardiac arrest in the setting of NSTEMI and acute CHF, along with acute kidney injury, discharged home with PT/OT and cardiac rehab but continued to be weak, very sleepy (more than normal), with poor p.o. intake.) 07/20: Intubated in ED, cardiology consulted for NSTEMI, admitted to PCCM. 07/21: Multiorgan failure, requiring pressors, poor prognosis 07/22, 07/23: DNR. Failed weaning trials 07/24: failed SAT/SBT d/t ischemic cardiomyopathy  07/25: No further interventions per cardiology. GOC discussion w/ Dr Mortimer Fries (ICU), remains DNR, agreed to extubation, increased WOB and trial of BiPap but pt did not tolerate this. Plan transfer to [facility] tomorrow.  07/26: TRH pickup from ICU. O2 requirement lower, will transfer out of SDU. PT/OT pending will likely need SNF/rehab. Temp increasing, repeat BCx, CXR, UA (no apparent UTI), resp PCR (negative), remove foley, may need to restart abx. CXR shows increased edema 07/27: lasix dose x2, WBC trending down, afebrile. BCx NG<24h. O2 saturation good on 2L but notable increased WOB on exam.  07/28: good UOP yesterday w/ Lasix. RR still occasionally high. SNF placement per TOC.  Per cardiology, patient has expressed a wish that she will eventually go home with her daughter and "be comfortable" and not have her come back to the hospital.  [facility] working on SNF placement here and will explore palliative/hospice options in  daughter's home state, patient has declined palliative consult here.  Progression goals: Appreciate cardiology recs re: discharge readiness, home meds on d/c and outpatient follow-up SNF placement pending.  Today is Friday, may be in hospital through the weekend to ensure cardiac/respiratory stabilization and SNF placement Monday PT/OT recommending SNF  Consultants:  Cardiology PCCU (initially admitted to ICU)  Procedures: Intubation 07/20, extubation 07/25 Central line placement 07/20, removed 07/26    Subjective: Patient reports feeling short winded same as yesterday.      ASSESSMENT & PLAN:   Principal Problem:   NSTEMI (non-ST elevated myocardial infarction) ([place]) Active Problems:   Unstable angina (HCC)   Acute respiratory failure with hypoxia (HCC)   AKI (acute kidney injury) ([place]) in the setting of stage IIIa chronic   S/P CABG x 2   Malnutrition of moderate degree   Fever and chills   NSTEMI (non-ST elevated myocardial infarction) ([place]) Unstable angina CAD s/p CABG Ischemic cardiomyopathy HTN HFrEF Managed medically Cardiology following - no further invasive procedures planned, another 40mg  IV lasix TID today restart isosorbide 15 mg daily.  continue low dose metoprolol XL 12.5mg , DAPT with aspirin 81 mg and 75 mg clopidogrel daily nitropaste 0.5inch q6h atorvastatin 80 mg nightly (holding torsemide 20 mg, spironolactone 12.5 mg, lisinopril 20 mg) Cardiology states she will likely have chronic angina from her severe CAD and she understands this  AKI (acute kidney injury) ([place]) in the setting of stage IIIa chronic Improving  Monitor serial BMP  Acute respiratory failure with hypoxia ([place]) Acute encephalopathy RESOLVED, off ventilator  Continue supplemental O2 as needed  Fever and chills RESOLVED Temp was increasing 07/26, no cocnerns repeat  BCx, CXR, UA, resp PCR remove foley     DVT prophylaxis: lovenox Code Status: DNR Family  Communication: family at bedside on rounds today Disposition Plan / TOC needs:  SNF, TOC also following to discuss hospice/palliative options when patient eventually goes home with her daughter. Barriers to discharge / significant pending items: continued monitoring post-extubation, will need SNF, today is Friday, will likely be here through the weekend pending SNF placement             Objective: Vitals:   01/19/22 1000 01/19/22 1200 01/19/22 1300 01/19/22 1423  BP: (!) 139/51 122/63  (!) 133/49  Pulse: 81 81 79 87  Resp: 14 (!) 23 14 20   Temp:  97.8 F (36.6 C)  98.8 F (37.1 C)  TempSrc:  Oral    SpO2: 100% 100% 100%   Weight:      Height:        Intake/Output Summary (Last 24 hours) at 01/19/2022 1436 Last data filed at 01/19/2022 1000 Gross per 24 hour  Intake 240 ml  Output 900 ml  Net -660 ml   Filed Weights   01/17/22 0500 01/18/22 0500 01/19/22 0409  Weight: 60 kg 61.3 kg 60.8 kg    Examination:  Constitutional:  VS as above General Appearance: alert, frail but not cachectic, NAD Eyes: Normal lids and conjunctive, non-icteric sclera Ears, Nose, Mouth, Throat: Normal appearance externally Neck: No masses, trachea midline Respiratory: Increased respiratory rate, no accessory muscle use  Breath sounds normal, no wheeze/rhonchi/rales but poor inspiratory effort  Cardiovascular: S1/S2 normal, no murmur/rub/gallop auscultated No lower extremity edema Gastrointestinal: Nontender, no masses Musculoskeletal:  No clubbing/cyanosis of digits Neurological: No cranial nerve deficit on limited exam Psychiatric: Normal judgment/insight Anxious mood and affect       Scheduled Medications:   aspirin EC  81 mg Oral Daily   atorvastatin  80 mg Oral QHS   Chlorhexidine Gluconate Cloth  6 each Topical Daily   clopidogrel  75 mg Oral Daily   dextromethorphan-guaiFENesin  1 tablet Oral BID   enoxaparin (LOVENOX) injection  40 mg Subcutaneous Q24H    feeding supplement  237 mL Oral TID BM   furosemide  40 mg Intravenous BID   insulin aspart  0-15 Units Subcutaneous Q4H   isosorbide mononitrate  15 mg Oral Daily   metoprolol succinate  12.5 mg Oral Daily   multivitamin with minerals  1 tablet Oral Daily   pantoprazole  40 mg Oral Daily    Continuous Infusions:  sodium chloride Stopped (01/15/22 0502)    PRN Medications:  acetaminophen, docusate sodium, ipratropium-albuterol, loperamide, morphine injection, nitroGLYCERIN, ondansetron (ZOFRAN) IV, mouth rinse, polyethylene glycol  Antimicrobials:  Anti-infectives (From admission, onward)    Start     Dose/Rate Route Frequency Ordered Stop   01/11/22 1200  piperacillin-tazobactam (ZOSYN) IVPB 3.375 g        3.375 g 12.5 mL/hr over 240 Minutes Intravenous Every 8 hours 01/11/22 1106 01/16/22 0149   01/11/22 0915  cefTRIAXone (ROCEPHIN) 1 g in sodium chloride 0.9 % 100 mL IVPB  Status:  Discontinued        1 g 200 mL/hr over 30 Minutes Intravenous Every 24 hours 01/11/22 0903 01/11/22 1106       Data Reviewed: I have personally reviewed following labs and imaging studies  CBC: Recent Labs  Lab 01/13/22 0339 01/14/22 0412 01/15/22 0456 01/17/22 1153 01/18/22 0419  WBC 9.4 8.3 9.7 13.2* 11.7*  NEUTROABS  --   --  7.9*  --  8.3*  HGB 7.7* 7.4* 7.2* 8.1* 8.2*  HCT 24.2* 23.7* 23.1* 26.3* 26.4*  MCV 97.6 97.5 98.3 100.8* 96.7  PLT 312 281 264 361 351   Basic Metabolic Panel: Recent Labs  Lab 01/13/22 0339 01/14/22 0412 01/15/22 0456 01/16/22 0443 01/17/22 0330 01/18/22 0419 01/19/22 0511  NA 134* 136 138 141 139 135 135  K 3.6 4.3 3.7 3.1* 3.7 4.2 3.4*  CL 103 102 102 104 103 99 98  CO2 25 27 28 27 27 24 30   GLUCOSE 198* 165* 188* 168* 98 100* 105*  BUN 41* 35* 37* 48* 49* 52* 49*  CREATININE 1.42* 1.16* 1.31* 1.36* 1.20* 1.06* 1.00  CALCIUM 7.9* 8.3* 7.9* 8.3* 8.5* 8.5* 8.3*  MG 2.3 2.4 2.2  --   --   --   --   PHOS 3.6 3.6 3.9  --   --   --   --     GFR: Estimated Creatinine Clearance: 35.6 mL/min (by C-G formula based on SCr of 1 mg/dL). Liver Function Tests: Recent Labs  Lab 01/15/22 0456 01/18/22 0419  AST 16 21  ALT 17 16  ALKPHOS 65 56  BILITOT 0.7 0.8  PROT 5.8* 6.1*  ALBUMIN 2.6* 2.9*   No results for input(s): "LIPASE", "AMYLASE" in the last 168 hours. No results for input(s): "AMMONIA" in the last 168 hours. Coagulation Profile: No results for input(s): "INR", "PROTIME" in the last 168 hours.  Cardiac Enzymes: No results for input(s): "CKTOTAL", "CKMB", "CKMBINDEX", "TROPONINI" in the last 168 hours. BNP (last 3 results) No results for input(s): "PROBNP" in the last 8760 hours. HbA1C: No results for input(s): "HGBA1C" in the last 72 hours. CBG: Recent Labs  Lab 01/18/22 1940 01/18/22 2343 01/19/22 0359 01/19/22 0728 01/19/22 1132  GLUCAP 162* 109* 128* 123* 107*   Lipid Profile: No results for input(s): "CHOL", "HDL", "LDLCALC", "TRIG", "CHOLHDL", "LDLDIRECT" in the last 72 hours.  Thyroid Function Tests: No results for input(s): "TSH", "T4TOTAL", "FREET4", "T3FREE", "THYROIDAB" in the last 72 hours. Anemia Panel: No results for input(s): "VITAMINB12", "FOLATE", "FERRITIN", "TIBC", "IRON", "RETICCTPCT" in the last 72 hours. Urine analysis:    Component Value Date/Time   COLORURINE STRAW (A) 01/17/2022 1832   APPEARANCEUR CLEAR (A) 01/17/2022 1832   LABSPEC 1.006 01/17/2022 1832   PHURINE 5.0 01/17/2022 1832   GLUCOSEU NEGATIVE 01/17/2022 1832   HGBUR NEGATIVE 01/17/2022 1832   BILIRUBINUR NEGATIVE 01/17/2022 1832   KETONESUR NEGATIVE 01/17/2022 1832   PROTEINUR NEGATIVE 01/17/2022 1832   NITRITE NEGATIVE 01/17/2022 1832   LEUKOCYTESUR NEGATIVE 01/17/2022 1832   Sepsis Labs: @LABRCNTIP (procalcitonin:4,lacticidven:4)  Recent Results (from the past 240 hour(s))  Urine Culture     Status: Abnormal   Collection Time: 01/11/22  7:33 AM   Specimen: Urine, Random  Result Value Ref Range Status    Specimen Description   Final    URINE, RANDOM Performed at [facility], [address], [place], [address] [place]    Special Requests   Final    NONE Performed at [facility], [address] [place], [address] [place]    Culture >=100,000 COLONIES/mL ESCHERICHIA COLI (A)  Final   Report Status 01/13/2022 FINAL  Final   Organism ID, Bacteria ESCHERICHIA COLI (A)  Final      Susceptibility   Escherichia coli - MIC*    AMPICILLIN <=2 SENSITIVE Sensitive     CEFAZOLIN <=4 SENSITIVE Sensitive     CEFEPIME <=0.12 SENSITIVE Sensitive     CEFTRIAXONE <=0.25  SENSITIVE Sensitive     CIPROFLOXACIN <=0.25 SENSITIVE Sensitive     GENTAMICIN <=1 SENSITIVE Sensitive     IMIPENEM <=0.25 SENSITIVE Sensitive     NITROFURANTOIN <=16 SENSITIVE Sensitive     TRIMETH/SULFA <=20 SENSITIVE Sensitive     AMPICILLIN/SULBACTAM <=2 SENSITIVE Sensitive     PIP/TAZO <=4 SENSITIVE Sensitive     * >=100,000 COLONIES/mL ESCHERICHIA COLI  SARS Coronavirus 2 by RT PCR (hospital order, performed in [facility] hospital lab) *cepheid single result test* Nasal Mucosa     Status: None   Collection Time: 01/11/22 11:00 AM   Specimen: Nasal Mucosa; Nasal Swab  Result Value Ref Range Status   SARS Coronavirus 2 by RT PCR NEGATIVE NEGATIVE Final    Comment: (NOTE) SARS-CoV-2 target nucleic acids are NOT DETECTED.  The SARS-CoV-2 RNA is generally detectable in upper and lower respiratory specimens during the acute phase of infection. The lowest concentration of SARS-CoV-2 viral copies this assay can detect is 250 copies / mL. A negative result does not preclude SARS-CoV-2 infection and should not be used as the sole basis for treatment or other patient management decisions.  A negative result may occur with improper specimen collection / handling, submission of specimen other than nasopharyngeal swab, presence of viral mutation(s) within the areas targeted by this assay, and inadequate number  of viral copies (<250 copies / mL). A negative result must be combined with clinical observations, patient history, and epidemiological information.  Fact Sheet for Patients:   https://www.patel.info/  Fact Sheet for Healthcare Providers: https://hall.com/  This test is not yet approved or  cleared by the [place] FDA and has been authorized for detection and/or diagnosis of SARS-CoV-2 by FDA under an Emergency Use Authorization (EUA).  This EUA will remain in effect (meaning this test can be used) for the duration of the COVID-19 declaration under Section 564(b)(1) of the Act, 21 U.S.C. section 360bbb-3(b)(1), unless the authorization is terminated or revoked sooner.  Performed at [facility], [address] [place], [place] [address]   MRSA Next Gen by PCR, Nasal     Status: None   Collection Time: 01/11/22 11:00 AM   Specimen: Nasal Mucosa; Nasal Swab  Result Value Ref Range Status   MRSA by PCR Next Gen NOT DETECTED NOT DETECTED Final    Comment: (NOTE) The GeneXpert MRSA Assay (FDA approved for NASAL specimens only), is one component of a comprehensive MRSA colonization surveillance program. It is not intended to diagnose MRSA infection nor to guide or monitor treatment for MRSA infections. Test performance is not FDA approved in patients less than 32 years old. Performed at [facility], [address] [place], [place] [address]   Culture, Respiratory w Gram Stain     Status: None   Collection Time: 01/11/22  2:05 PM   Specimen: Tracheal Aspirate; Respiratory  Result Value Ref Range Status   Specimen Description   Final    TRACHEAL ASPIRATE Performed at [facility], [address], [place], [place] [address]    Special Requests   Final    NONE Performed at [facility], [address] [place], [place] [address]    Gram Stain   Final    FEW WBC PRESENT,BOTH PMN AND  MONONUCLEAR FEW GRAM POSITIVE COCCI IN PAIRS    Culture   Final    RARE Normal respiratory flora-no Staph aureus or Pseudomonas seen Performed at [facility] [address], [place], [place] [address]  Report Status 01/14/2022 FINAL  Final  Culture, blood (Routine X 2) w Reflex to ID Panel     Status: None (Preliminary result)   Collection Time: 01/17/22  6:00 PM   Specimen: BLOOD  Result Value Ref Range Status   Specimen Description BLOOD RIGHT ANTECUBITAL  Final   Special Requests   Final    BOTTLES DRAWN AEROBIC AND ANAEROBIC Blood Culture adequate volume   Culture   Final    NO GROWTH 2 DAYS Performed at [facility], [address], [place], [place] [address]    Report Status PENDING  Incomplete  Culture, blood (Routine X 2) w Reflex to ID Panel     Status: None (Preliminary result)   Collection Time: 01/17/22  6:00 PM   Specimen: BLOOD  Result Value Ref Range Status   Specimen Description BLOOD BLOOD LEFT WRIST  Final   Special Requests   Final    BOTTLES DRAWN AEROBIC AND ANAEROBIC Blood Culture adequate volume   Culture   Final    NO GROWTH 2 DAYS Performed at [facility], [address] [place], [place] [address]    Report Status PENDING  Incomplete  Remove urinary catheter to obtain Clean Catch urine culture     Status: None   Collection Time: 01/17/22  6:32 PM   Specimen: Urine, Clean Catch  Result Value Ref Range Status   Specimen Description   Final    URINE, CLEAN CATCH Performed at [facility], [address], [place], [place] [address]    Special Requests   Final    NONE Performed at [facility], [address] [place], [place] [address]    Culture   Final    NO GROWTH Performed at [facility], [address] [place], [place] [address]    Report Status 01/18/2022 FINAL  Final  Respiratory (~20 pathogens) panel by PCR     Status: None   Collection Time: 01/17/22  6:32 PM   Specimen:  Nasopharyngeal Swab; Respiratory  Result Value Ref Range Status   Adenovirus NOT DETECTED NOT DETECTED Final   Coronavirus 229E NOT DETECTED NOT DETECTED Final    Comment: (NOTE) The Coronavirus on the Respiratory Panel, DOES NOT test for the novel  Coronavirus (2019 nCoV)    Coronavirus HKU1 NOT DETECTED NOT DETECTED Final   Coronavirus NL63 NOT DETECTED NOT DETECTED Final   Coronavirus OC43 NOT DETECTED NOT DETECTED Final   Metapneumovirus NOT DETECTED NOT DETECTED Final   Rhinovirus / Enterovirus NOT DETECTED NOT DETECTED Final   Influenza A NOT DETECTED NOT DETECTED Final   Influenza B NOT DETECTED NOT DETECTED Final   Parainfluenza Virus 1 NOT DETECTED NOT DETECTED Final   Parainfluenza Virus 2 NOT DETECTED NOT DETECTED Final   Parainfluenza Virus 3 NOT DETECTED NOT DETECTED Final   Parainfluenza Virus 4 NOT DETECTED NOT DETECTED Final   Respiratory Syncytial Virus NOT DETECTED NOT DETECTED Final   Bordetella pertussis NOT DETECTED NOT DETECTED Final   Bordetella Parapertussis NOT DETECTED NOT DETECTED Final   Chlamydophila pneumoniae NOT DETECTED NOT DETECTED Final   Mycoplasma pneumoniae NOT DETECTED NOT DETECTED Final    Comment: Performed at [facility], [address], [place], [place] [address]         Radiology Studies last 96 hours: DG Chest Port 1 View  Result Date: 01/17/2022 CLINICAL DATA:  Fever, shortness of breath EXAM: PORTABLE CHEST 1 VIEW COMPARISON:  Previous studies including  the examination of 01/15/2022 FINDINGS: Transverse diameter of Elnoria Howard is increased. Central pulmonary vessels are prominent. Increased interstitial markings are seen in the parahilar regions. Small to moderate bilateral pleural effusions are seen, more so on the left side with interval increase. Evaluation of left lower lung field for infiltrates is limited by the effusion. There is no pneumothorax. There is interval removal of endotracheal tube, central venous catheter and enteric  tube. IMPRESSION: Central pulmonary vessels are prominent with increased interstitial markings in the parahilar regions suggesting CHF. Small to moderate bilateral pleural effusions, more so on the left side with interval increase. Electronically Signed   By: Elmer Picker M.D.   On: 01/17/2022 17:38            LOS: 8 days       Emeterio Reeve, DO [facility] 01/19/2022, 2:36 PM   Staff may message me via secure chat in [place]  but this may not receive immediate response,  please page for urgent matters!  If 7PM-7AM, please contact night-coverage [email or web address]  Dictation software was used to generate the above note. Typos may occur and escape review, as with typed/written notes. Please contact Dr Sheppard Coil directly for clarity if needed.

## 2022-01-19 NOTE — Progress Notes (Addendum)
PT Cancellation Note  Patient Details Name: Lindsey Pollard MRN: [id number] DOB: [birth year]   Cancelled Treatment:    Reason Eval/Treat Not Completed: Fatigue/lethargy limiting ability to participate (Patient declined due to fatigue. She reports she did exercise in the bed last night which resulted in chest pains. She is requesting to just rest at this time. PT will continue with attempts.)  Donna Bernard, PT, MPT   Ina Homes 01/19/2022, 2:29 PM

## 2022-01-20 DIAGNOSIS — Z7189 Other specified counseling: Secondary | ICD-10-CM

## 2022-01-20 DIAGNOSIS — I214 Non-ST elevation (NSTEMI) myocardial infarction: Secondary | ICD-10-CM | POA: Diagnosis not present

## 2022-01-20 LAB — CBC
HCT: 28.1 % — ABNORMAL LOW (ref 36.0–46.0)
Hemoglobin: 9 g/dL — ABNORMAL LOW (ref 12.0–15.0)
MCH: 30.8 pg (ref 26.0–34.0)
MCHC: 32 g/dL (ref 30.0–36.0)
MCV: 96.2 fL (ref 80.0–100.0)
Platelets: 465 10*3/uL — ABNORMAL HIGH (ref 150–400)
RBC: 2.92 MIL/uL — ABNORMAL LOW (ref 3.87–5.11)
RDW: 14.9 % (ref 11.5–15.5)
WBC: 11.4 10*3/uL — ABNORMAL HIGH (ref 4.0–10.5)
nRBC: 0 % (ref 0.0–0.2)

## 2022-01-20 LAB — BASIC METABOLIC PANEL
Anion gap: 9 (ref 5–15)
BUN: 40 mg/dL — ABNORMAL HIGH (ref 8–23)
CO2: 30 mmol/L (ref 22–32)
Calcium: 8.9 mg/dL (ref 8.9–10.3)
Chloride: 96 mmol/L — ABNORMAL LOW (ref 98–111)
Creatinine, Ser: 1.07 mg/dL — ABNORMAL HIGH (ref 0.44–1.00)
GFR, Estimated: 53 mL/min — ABNORMAL LOW (ref >60)
Glucose, Bld: 172 mg/dL — ABNORMAL HIGH (ref 70–99)
Potassium: 3.6 mmol/L (ref 3.5–5.1)
Sodium: 135 mmol/L (ref 135–145)

## 2022-01-20 LAB — GLUCOSE, CAPILLARY
Glucose-Capillary: 149 mg/dL — ABNORMAL HIGH (ref 70–99)
Glucose-Capillary: 118 mg/dL — ABNORMAL HIGH (ref 70–99)
Glucose-Capillary: 130 mg/dL — ABNORMAL HIGH (ref 70–99)
Glucose-Capillary: 113 mg/dL — ABNORMAL HIGH (ref 70–99)
Glucose-Capillary: 270 mg/dL — ABNORMAL HIGH (ref 70–99)

## 2022-01-20 MED ORDER — ISOSORBIDE MONONITRATE ER 30 MG PO TB24
30.0000 mg | ORAL_TABLET | Freq: Every day | ORAL | Status: DC
Start: 2022-01-21 — End: 2022-01-22
  Administered 2022-01-21: 30 mg via ORAL
  Filled 2022-01-20 (×2): qty 1

## 2022-01-20 MED ORDER — INSULIN ASPART 100 UNIT/ML IJ SOLN
0.0000 [IU] | Freq: Three times a day (TID) | INTRAMUSCULAR | Status: DC
  Administered 2022-01-20: 8 [IU] via SUBCUTANEOUS
  Administered 2022-01-21 – 2022-01-23 (×5): 2 [IU] via SUBCUTANEOUS
  Filled 2022-01-20 (×6): qty 1

## 2022-01-20 NOTE — Progress Notes (Signed)
PROGRESS NOTE    Lindsey Pollard  FXT:[id number] DOB: 1940/07/20  DOA: 01/11/2022 Date of Service: 01/20/22 PCP: Lauro Regulus, MD     Brief Narrative / Hospital Course:  81 year old female with a past medical history significant for recent V-fib arrest, HFpEF, ischemic cardiomyopathy, CAD status post CABG x2 in 2018 and PCI, chronic kidney disease stage III, diabetes mellitus who presents to [facility] ED on 01/11/2022 due to altered mental status, respiratory distress, and nausea/vomiting. (Of note: Was recently admitted from 7/12 through 7/18 for brief cardiac arrest in the setting of NSTEMI and acute CHF, along with acute kidney injury, discharged home with PT/OT and cardiac rehab but continued to be weak, very sleepy (more than normal), with poor p.o. intake.) 07/20: Intubated in ED, cardiology consulted for NSTEMI, admitted to PCCM. 07/21: Multiorgan failure, requiring pressors, poor prognosis 07/22, 07/23: DNR. Failed weaning trials 07/24: failed SAT/SBT d/t ischemic cardiomyopathy  07/25: No further interventions per cardiology. GOC discussion w/ Dr Belia Heman (ICU), remains DNR, agreed to extubation, increased WOB and trial of BiPap but pt did not tolerate this. Plan transfer to [facility] tomorrow.  07/26: TRH pickup from ICU. O2 requirement lower, will transfer out of SDU. PT/OT pending will likely need SNF/rehab. Temp increasing, repeat BCx, CXR, UA (no apparent UTI), resp PCR (negative), remove foley, may need to restart abx. CXR shows increased edema 07/27: lasix dose x2, WBC trending down, afebrile. BCx NG<24h. O2 saturation good on 2L but notable increased WOB on exam.  07/28: good UOP yesterday w/ Lasix. RR still occasionally high. SNF placement per TOC.  Per cardiology, patient has expressed a wish that she will eventually go home with her daughter and "be comfortable" and not have her come back to the hospital.  [facility] working on SNF placement here and will explore palliative/hospice options in  daughter's home state, patient has declined palliative consult here. 07/29: continues to have subjective SOB relieved by neb tx, SpO2 has been 97% on 3L, continued intermittent chest pain. Awaiting SNF placement.  GOC discussion with patient, see assessment/plan Progression goals: Appreciate cardiology recs re: discharge readiness, home meds on d/c and outpatient follow-up SNF placement pending.  Today is Saturday, may be in hospital through the weekend to ensure cardiac/respiratory stabilization and SNF placement Monday   Consultants:  Cardiology PCCU (initially admitted to ICU)  Procedures: Intubation 07/20, extubation 07/25 Central line placement 07/20, removed 07/26    Subjective: Patient reports feeling short winded same as yesterday.  Reports intermittent chest pain, also same as yesterday.  Patient states she feels slightly better with breathing treatments.     ASSESSMENT & PLAN:   Principal Problem:   NSTEMI (non-ST elevated myocardial infarction) (HCC) Active Problems:   Unstable angina (HCC)   Acute respiratory failure with hypoxia (HCC)   AKI (acute kidney injury) (HCC) in the setting of stage IIIa chronic   S/P CABG x 2   Malnutrition of moderate degree   Fever and chills   Goals of care, counseling/discussion   NSTEMI (non-ST elevated myocardial infarction) (HCC) Unstable angina CAD s/p CABG Ischemic cardiomyopathy HTN HFrEF Managed medically Cardiology following - no further invasive procedures planned, another 40mg  IV lasix TID today restart isosorbide 15 mg daily --> increased to 30 mg 07/29 continue low dose metoprolol XL 12.5mg , DAPT with aspirin 81 mg and 75 mg clopidogrel daily nitropaste 0.5inch q6h atorvastatin 80 mg nightly (holding torsemide 20 mg, spironolactone 12.5 mg, lisinopril 20 mg) Cardiology states she will likely have chronic angina  from her severe CAD and she understands this  AKI (acute kidney injury) (HCC) in the setting of  stage IIIa chronic Improving  Monitor serial BMP  Acute respiratory failure with hypoxia (HCC) Acute encephalopathy RESOLVED, off ventilator  Continue supplemental O2 as needed  Fever and chills RESOLVED Temp was increasing 07/26, no cocnerns repeat BCx, CXR, UA, resp PCR remove foley  Goals of care, counseling/discussion Patient has been resistant to accepting palliative care consult.  Given that cardiology has noted she is likely to experience persistent angina/SOB and there is little to no cardiac intervention that they can do for her at this point given severity of her disease, I discussed with patient what she would want to happen if/when she develops CP/SOB outside the hospital.  Would she like to be brought to the hospital to treat aggressively as we have been doing, versus treat symptoms as able with medications at home.  I did not specifically discuss hospice/palliative care, but I did bring up that I would like to have a plan going forward for when she discharges, because if she does not want to come back to the hospital/receive aggressive treatment, we need to have a plan in place for medications to help her symptoms.  Patient states that she will think about it.     DVT prophylaxis: lovenox Code Status: DNR Family Communication: Will call family to discuss GOC further since patient plans on eventually living with her daughter. Disposition Plan / TOC needs:  SNF, TOC also following to discuss hospice/palliative options when patient eventually goes home with her daughter. Barriers to discharge / significant pending items: today is Saturday, will likely be here through the weekend pending SNF placement             Objective: Vitals:   01/20/22 0433 01/20/22 0502 01/20/22 0545 01/20/22 0753  BP:    (!) 144/60  Pulse: (!) 106 90  82  Resp: (!) 24 (!) 22  18  Temp:    97.6 F (36.4 C)  TempSrc:    Oral  SpO2: 97% 100%  100%  Weight:   61.1 kg   Height:         Intake/Output Summary (Last 24 hours) at 01/20/2022 1258 Last data filed at 01/20/2022 1157 Gross per 24 hour  Intake 360 ml  Output 400 ml  Net -40 ml   Filed Weights   01/18/22 0500 01/19/22 0409 01/20/22 0545  Weight: 61.3 kg 60.8 kg 61.1 kg    Examination:  Constitutional:  VS as above General Appearance: alert, frail but not cachectic, NAD Eyes: Normal lids and conjunctive, non-icteric sclera Ears, Nose, Mouth, Throat: Normal appearance externally Neck: No masses, trachea midline Respiratory: Increased respiratory rate, no accessory muscle use -when I walked into the room patient appeared to be breathing deeply but not rapidly while asleep, on awakening she definitely becomes tachypneic. Breath sounds normal, no wheeze/rhonchi/rales but poor inspiratory effort  Cardiovascular: S1/S2 normal, no murmur/rub/gallop auscultated No lower extremity edema Gastrointestinal: Nontender, no masses Musculoskeletal:  No clubbing/cyanosis of digits Neurological: No cranial nerve deficit on limited exam Psychiatric: Normal judgment/insight Anxious mood and affect       Scheduled Medications:   aspirin EC  81 mg Oral Daily   atorvastatin  80 mg Oral QHS   Chlorhexidine Gluconate Cloth  6 each Topical Daily   clopidogrel  75 mg Oral Daily   dextromethorphan-guaiFENesin  1 tablet Oral BID   enoxaparin (LOVENOX) injection  40 mg Subcutaneous Q24H  feeding supplement  237 mL Oral TID BM   insulin aspart  0-15 Units Subcutaneous Q4H   [START ON 01/21/2022] isosorbide mononitrate  30 mg Oral Daily   metoprolol succinate  12.5 mg Oral Daily   multivitamin with minerals  1 tablet Oral Daily   pantoprazole  40 mg Oral Daily    Continuous Infusions:  sodium chloride Stopped (01/15/22 0502)    PRN Medications:  acetaminophen, docusate sodium, ipratropium-albuterol, loperamide, morphine injection, nitroGLYCERIN, ondansetron (ZOFRAN) IV, mouth rinse, polyethylene  glycol  Antimicrobials:  Anti-infectives (From admission, onward)    Start     Dose/Rate Route Frequency Ordered Stop   01/11/22 1200  piperacillin-tazobactam (ZOSYN) IVPB 3.375 g        3.375 g 12.5 mL/hr over 240 Minutes Intravenous Every 8 hours 01/11/22 1106 01/16/22 0149   01/11/22 0915  cefTRIAXone (ROCEPHIN) 1 g in sodium chloride 0.9 % 100 mL IVPB  Status:  Discontinued        1 g 200 mL/hr over 30 Minutes Intravenous Every 24 hours 01/11/22 0903 01/11/22 1106       Data Reviewed: I have personally reviewed following labs and imaging studies  CBC: Recent Labs  Lab 01/14/22 0412 01/15/22 0456 01/17/22 1153 01/18/22 0419 01/20/22 0451  WBC 8.3 9.7 13.2* 11.7* 11.4*  NEUTROABS  --  7.9*  --  8.3*  --   HGB 7.4* 7.2* 8.1* 8.2* 9.0*  HCT 23.7* 23.1* 26.3* 26.4* 28.1*  MCV 97.5 98.3 100.8* 96.7 96.2  PLT 281 264 361 351 [id number]*   Basic Metabolic Panel: Recent Labs  Lab 01/14/22 0412 01/15/22 0456 01/16/22 0443 01/17/22 0330 01/18/22 0419 01/19/22 0511 01/20/22 0451  NA 136 138 141 139 135 135 135  K 4.3 3.7 3.1* 3.7 4.2 3.4* 3.6  CL 102 102 104 103 99 98 96*  CO2 27 28 27 27 24 30 30   GLUCOSE 165* 188* 168* 98 100* 105* 172*  BUN 35* 37* 48* 49* 52* 49* 40*  CREATININE 1.16* 1.31* 1.36* 1.20* 1.06* 1.00 1.07*  CALCIUM 8.3* 7.9* 8.3* 8.5* 8.5* 8.3* 8.9  MG 2.4 2.2  --   --   --   --   --   PHOS 3.6 3.9  --   --   --   --   --    GFR: Estimated Creatinine Clearance: 33.4 mL/min (A) (by C-G formula based on SCr of 1.07 mg/dL (H)). Liver Function Tests: Recent Labs  Lab 01/15/22 0456 01/18/22 0419  AST 16 21  ALT 17 16  ALKPHOS 65 56  BILITOT 0.7 0.8  PROT 5.8* 6.1*  ALBUMIN 2.6* 2.9*   No results for input(s): "LIPASE", "AMYLASE" in the last 168 hours. No results for input(s): "AMMONIA" in the last 168 hours. Coagulation Profile: No results for input(s): "INR", "PROTIME" in the last 168 hours.  Cardiac Enzymes: No results for input(s): "CKTOTAL",  "CKMB", "CKMBINDEX", "TROPONINI" in the last 168 hours. BNP (last 3 results) No results for input(s): "PROBNP" in the last 8760 hours. HbA1C: No results for input(s): "HGBA1C" in the last 72 hours. CBG: Recent Labs  Lab 01/19/22 2041 01/19/22 2324 01/20/22 0416 01/20/22 0755 01/20/22 1127  GLUCAP 132* 98 149* 118* 130*   Lipid Profile: No results for input(s): "CHOL", "HDL", "LDLCALC", "TRIG", "CHOLHDL", "LDLDIRECT" in the last 72 hours.  Thyroid Function Tests: No results for input(s): "TSH", "T4TOTAL", "FREET4", "T3FREE", "THYROIDAB" in the last 72 hours. Anemia Panel: No results for input(s): "VITAMINB12", "FOLATE", "FERRITIN", "TIBC", "IRON", "  RETICCTPCT" in the last 72 hours. Urine analysis:    Component Value Date/Time   COLORURINE STRAW (A) 01/17/2022 1832   APPEARANCEUR CLEAR (A) 01/17/2022 1832   LABSPEC 1.006 01/17/2022 1832   PHURINE 5.0 01/17/2022 1832   GLUCOSEU NEGATIVE 01/17/2022 1832   HGBUR NEGATIVE 01/17/2022 1832   BILIRUBINUR NEGATIVE 01/17/2022 1832   KETONESUR NEGATIVE 01/17/2022 1832   PROTEINUR NEGATIVE 01/17/2022 1832   NITRITE NEGATIVE 01/17/2022 1832   LEUKOCYTESUR NEGATIVE 01/17/2022 1832   Sepsis Labs: @LABRCNTIP (procalcitonin:4,lacticidven:4)  Recent Results (from the past 240 hour(s))  Urine Culture     Status: Abnormal   Collection Time: 01/11/22  7:33 AM   Specimen: Urine, Random  Result Value Ref Range Status   Specimen Description   Final    URINE, RANDOM Performed at [facility], [address] [place], [place] [address]    Special Requests   Final    NONE Performed at [facility], [address] [place], [place] [address]    Culture >=100,000 COLONIES/mL ESCHERICHIA COLI (A)  Final   Report Status 01/13/2022 FINAL  Final   Organism ID, Bacteria ESCHERICHIA COLI (A)  Final      Susceptibility   Escherichia coli - MIC*    AMPICILLIN <=2 SENSITIVE Sensitive     CEFAZOLIN <=4 SENSITIVE Sensitive      CEFEPIME <=0.12 SENSITIVE Sensitive     CEFTRIAXONE <=0.25 SENSITIVE Sensitive     CIPROFLOXACIN <=0.25 SENSITIVE Sensitive     GENTAMICIN <=1 SENSITIVE Sensitive     IMIPENEM <=0.25 SENSITIVE Sensitive     NITROFURANTOIN <=16 SENSITIVE Sensitive     TRIMETH/SULFA <=20 SENSITIVE Sensitive     AMPICILLIN/SULBACTAM <=2 SENSITIVE Sensitive     PIP/TAZO <=4 SENSITIVE Sensitive     * >=100,000 COLONIES/mL ESCHERICHIA COLI  SARS Coronavirus 2 by RT PCR (hospital order, performed in [facility] hospital lab) *cepheid single result test* Nasal Mucosa     Status: None   Collection Time: 01/11/22 11:00 AM   Specimen: Nasal Mucosa; Nasal Swab  Result Value Ref Range Status   SARS Coronavirus 2 by RT PCR NEGATIVE NEGATIVE Final    Comment: (NOTE) SARS-CoV-2 target nucleic acids are NOT DETECTED.  The SARS-CoV-2 RNA is generally detectable in upper and lower respiratory specimens during the acute phase of infection. The lowest concentration of SARS-CoV-2 viral copies this assay can detect is 250 copies / mL. A negative result does not preclude SARS-CoV-2 infection and should not be used as the sole basis for treatment or other patient management decisions.  A negative result may occur with improper specimen collection / handling, submission of specimen other than nasopharyngeal swab, presence of viral mutation(s) within the areas targeted by this assay, and inadequate number of viral copies (<250 copies / mL). A negative result must be combined with clinical observations, patient history, and epidemiological information.  Fact Sheet for Patients:   https://www.patel.info/  Fact Sheet for Healthcare Providers: https://hall.com/  This test is not yet approved or  cleared by the [place] FDA and has been authorized for detection and/or diagnosis of SARS-CoV-2 by FDA under an Emergency Use Authorization (EUA).  This EUA will remain in effect  (meaning this test can be used) for the duration of the COVID-19 declaration under Section 564(b)(1) of the Act, 21 U.S.C. section 360bbb-3(b)(1), unless the authorization is terminated or revoked sooner.  Performed at [facility], [address] [place], [place] [address]   MRSA Next Gen by PCR, Nasal  Status: None   Collection Time: 01/11/22 11:00 AM   Specimen: Nasal Mucosa; Nasal Swab  Result Value Ref Range Status   MRSA by PCR Next Gen NOT DETECTED NOT DETECTED Final    Comment: (NOTE) The GeneXpert MRSA Assay (FDA approved for NASAL specimens only), is one component of a comprehensive MRSA colonization surveillance program. It is not intended to diagnose MRSA infection nor to guide or monitor treatment for MRSA infections. Test performance is not FDA approved in patients less than 62 years old. Performed at [facility], [address] [place], [place] [address]   Culture, Respiratory w Gram Stain     Status: None   Collection Time: 01/11/22  2:05 PM   Specimen: Tracheal Aspirate; Respiratory  Result Value Ref Range Status   Specimen Description   Final    TRACHEAL ASPIRATE Performed at [facility], [address] [place], [place] [address]    Special Requests   Final    NONE Performed at [facility], [address] [place], [place] [address]    Gram Stain   Final    FEW WBC PRESENT,BOTH PMN AND MONONUCLEAR FEW GRAM POSITIVE COCCI IN PAIRS    Culture   Final    RARE Normal respiratory flora-no Staph aureus or Pseudomonas seen Performed at [place] [address] [place], [place] [address]    Report Status 01/14/2022 FINAL  Final  Culture, blood (Routine X 2) w Reflex to ID Panel     Status: None (Preliminary result)   Collection Time: 01/17/22  6:00 PM   Specimen: BLOOD  Result Value Ref Range Status   Specimen Description BLOOD RIGHT ANTECUBITAL  Final   Special Requests   Final    BOTTLES DRAWN  AEROBIC AND ANAEROBIC Blood Culture adequate volume   Culture   Final    NO GROWTH 3 DAYS Performed at [facility], [address] [place], [place] [address]    Report Status PENDING  Incomplete  Culture, blood (Routine X 2) w Reflex to ID Panel     Status: None (Preliminary result)   Collection Time: 01/17/22  6:00 PM   Specimen: BLOOD  Result Value Ref Range Status   Specimen Description BLOOD BLOOD LEFT WRIST  Final   Special Requests   Final    BOTTLES DRAWN AEROBIC AND ANAEROBIC Blood Culture adequate volume   Culture   Final    NO GROWTH 3 DAYS Performed at [facility], [address] [place], [place] [address]    Report Status PENDING  Incomplete  Remove urinary catheter to obtain Clean Catch urine culture     Status: None   Collection Time: 01/17/22  6:32 PM   Specimen: Urine, Clean Catch  Result Value Ref Range Status   Specimen Description   Final    URINE, CLEAN CATCH Performed at [facility], [address], [place], [place] [address]    Special Requests   Final    NONE Performed at [facility], [address] [place], [place] [address]    Culture   Final    NO GROWTH Performed at [facility], [place] [address] [place], [place] [address]    Report Status 01/18/2022 FINAL  Final  Respiratory (~20 pathogens) panel by PCR     Status: None   Collection Time: 01/17/22  6:32 PM   Specimen: Nasopharyngeal Swab; Respiratory  Result Value Ref Range Status   Adenovirus NOT  DETECTED NOT DETECTED Final   Coronavirus 229E NOT DETECTED NOT DETECTED Final    Comment: (NOTE) The Coronavirus on the Respiratory Panel, DOES NOT test for the novel  Coronavirus (2019 nCoV)    Coronavirus HKU1 NOT DETECTED NOT DETECTED Final   Coronavirus NL63 NOT DETECTED NOT DETECTED Final   Coronavirus OC43 NOT DETECTED NOT DETECTED Final   Metapneumovirus NOT DETECTED NOT DETECTED Final   Rhinovirus / Enterovirus NOT DETECTED NOT DETECTED  Final   Influenza A NOT DETECTED NOT DETECTED Final   Influenza B NOT DETECTED NOT DETECTED Final   Parainfluenza Virus 1 NOT DETECTED NOT DETECTED Final   Parainfluenza Virus 2 NOT DETECTED NOT DETECTED Final   Parainfluenza Virus 3 NOT DETECTED NOT DETECTED Final   Parainfluenza Virus 4 NOT DETECTED NOT DETECTED Final   Respiratory Syncytial Virus NOT DETECTED NOT DETECTED Final   Bordetella pertussis NOT DETECTED NOT DETECTED Final   Bordetella Parapertussis NOT DETECTED NOT DETECTED Final   Chlamydophila pneumoniae NOT DETECTED NOT DETECTED Final   Mycoplasma pneumoniae NOT DETECTED NOT DETECTED Final    Comment: Performed at [facility], [place] [address], [place], [place] [address]         Radiology Studies last 96 hours: DG Chest Port 1 View  Result Date: 01/17/2022 CLINICAL DATA:  Fever, shortness of breath EXAM: PORTABLE CHEST 1 VIEW COMPARISON:  Previous studies including the examination of 01/15/2022 FINDINGS: Transverse diameter of Elnoria Howard is increased. Central pulmonary vessels are prominent. Increased interstitial markings are seen in the parahilar regions. Small to moderate bilateral pleural effusions are seen, more so on the left side with interval increase. Evaluation of left lower lung field for infiltrates is limited by the effusion. There is no pneumothorax. There is interval removal of endotracheal tube, central venous catheter and enteric tube. IMPRESSION: Central pulmonary vessels are prominent with increased interstitial markings in the parahilar regions suggesting CHF. Small to moderate bilateral pleural effusions, more so on the left side with interval increase. Electronically Signed   By: Elmer Picker M.D.   On: 01/17/2022 17:38            LOS: 9 days       Emeterio Reeve, DO [facility] 01/20/2022, 12:58 PM   Staff may message me via secure chat in [place]  but this may not receive immediate response,  please page for urgent  matters!  If 7PM-7AM, please contact night-coverage [email or web address]  Dictation software was used to generate the above note. Typos may occur and escape review, as with typed/written notes. Please contact Dr Sheppard Coil directly for clarity if needed.

## 2022-01-20 NOTE — Plan of Care (Signed)
  Problem: Education: Goal: Knowledge of General Education information will improve Description: Including pain rating scale, medication(s)/side effects and non-pharmacologic comfort measures Outcome: Progressing   Problem: Health Behavior/Discharge Planning: Goal: Ability to manage health-related needs will improve Outcome: Progressing   Problem: Clinical Measurements: Goal: Ability to maintain clinical measurements within normal limits will improve Outcome: Progressing Goal: Will remain free from infection Outcome: Progressing Goal: Diagnostic test results will improve Outcome: Progressing Goal: Respiratory complications will improve Outcome: Progressing Goal: Cardiovascular complication will be avoided Outcome: Progressing   Problem: Activity: Goal: Risk for activity intolerance will decrease Outcome: Progressing   Problem: Nutrition: Goal: Adequate nutrition will be maintained Outcome: Progressing   Problem: Coping: Goal: Level of anxiety will decrease Outcome: Progressing   Problem: Elimination: Goal: Will not experience complications related to bowel motility Outcome: Progressing Goal: Will not experience complications related to urinary retention Outcome: Progressing   Problem: Pain Managment: Goal: General experience of comfort will improve Outcome: Progressing   Problem: Safety: Goal: Ability to remain free from injury will improve Outcome: Progressing   Problem: Skin Integrity: Goal: Risk for impaired skin integrity will decrease Outcome: Progressing   Problem: Education: Goal: Ability to describe self-care measures that may prevent or decrease complications (Diabetes Survival Skills Education) will improve Outcome: Progressing   Problem: Coping: Goal: Ability to adjust to condition or change in health will improve Outcome: Progressing   Problem: Fluid Volume: Goal: Ability to maintain a balanced intake and output will improve Outcome:  Progressing   Problem: Health Behavior/Discharge Planning: Goal: Ability to identify and utilize available resources and services will improve Outcome: Progressing Goal: Ability to manage health-related needs will improve Outcome: Progressing   Problem: Metabolic: Goal: Ability to maintain appropriate glucose levels will improve Outcome: Progressing   Problem: Nutritional: Goal: Maintenance of adequate nutrition will improve Outcome: Progressing Goal: Progress toward achieving an optimal weight will improve Outcome: Progressing   Problem: Skin Integrity: Goal: Risk for impaired skin integrity will decrease Outcome: Progressing   Problem: Tissue Perfusion: Goal: Adequacy of tissue perfusion will improve Outcome: Progressing   Problem: Activity: Goal: Ability to tolerate increased activity will improve Outcome: Progressing   Problem: Respiratory: Goal: Ability to maintain a clear airway and adequate ventilation will improve Outcome: Progressing   Problem: Role Relationship: Goal: Method of communication will improve Outcome: Progressing

## 2022-01-20 NOTE — Progress Notes (Addendum)
RRT called by RN to administer nebulizer requested by patient. Patient appears anxious and diaphoretic with tachypneic labored breathing and use of accessory and abdominal muscles. Oxygen saturation 97% on 3L. Nebulizer administered. Patient states she feels "a little bit better" after nebulizer, but that her chest hurts. RN notified.

## 2022-01-20 NOTE — Progress Notes (Signed)
[facility] Cardiology  SUBJECTIVE: Patient laying in bed, denies chest pain, experience shortness of breath earlier this morning, required nebulizer therapy with modest clinical improvement   Vitals:   01/20/22 0433 01/20/22 0502 01/20/22 0545 01/20/22 0753  BP:    (!) 144/60  Pulse: (!) 106 90  82  Resp: (!) 24 (!) 22  18  Temp:    97.6 F (36.4 C)  TempSrc:    Oral  SpO2: 97% 100%  100%  Weight:   61.1 kg   Height:         Intake/Output Summary (Last 24 hours) at 01/20/2022 0849 Last data filed at 01/20/2022 0848 Gross per 24 hour  Intake 420 ml  Output 400 ml  Net 20 ml      PHYSICAL EXAM  General: Well developed, well nourished, in no acute distress HEENT:  Normocephalic and atramatic Neck:  No JVD.  Lungs: Clear bilaterally to auscultation and percussion. Heart: HRRR . Normal S1 and S2 without gallops or murmurs.  Abdomen: Bowel sounds are positive, abdomen soft and non-tender  Msk:  Back normal, normal gait. Normal strength and tone for age. Extremities: No clubbing, cyanosis or edema.   Neuro: Alert and oriented X 3. Psych:  Good affect, responds appropriately   LABS: Basic Metabolic Panel: Recent Labs    01/19/22 0511 01/20/22 0451  NA 135 135  K 3.4* 3.6  CL 98 96*  CO2 30 30  GLUCOSE 105* 172*  BUN 49* 40*  CREATININE 1.00 1.07*  CALCIUM 8.3* 8.9   Liver Function Tests: Recent Labs    01/18/22 0419  AST 21  ALT 16  ALKPHOS 56  BILITOT 0.8  PROT 6.1*  ALBUMIN 2.9*   No results for input(s): "LIPASE", "AMYLASE" in the last 72 hours. CBC: Recent Labs    01/18/22 0419 01/20/22 0451  WBC 11.7* 11.4*  NEUTROABS 8.3*  --   HGB 8.2* 9.0*  HCT 26.4* 28.1*  MCV 96.7 96.2  PLT 351 465*   Cardiac Enzymes: No results for input(s): "CKTOTAL", "CKMB", "CKMBINDEX", "TROPONINI" in the last 72 hours. BNP: Invalid input(s): "POCBNP" D-Dimer: No results for input(s): "DDIMER" in the last 72 hours. Hemoglobin A1C: No results for input(s): "HGBA1C"  in the last 72 hours. Fasting Lipid Panel: No results for input(s): "CHOL", "HDL", "LDLCALC", "TRIG", "CHOLHDL", "LDLDIRECT" in the last 72 hours. Thyroid Function Tests: No results for input(s): "TSH", "T4TOTAL", "T3FREE", "THYROIDAB" in the last 72 hours.  Invalid input(s): "FREET3" Anemia Panel: No results for input(s): "VITAMINB12", "FOLATE", "FERRITIN", "TIBC", "IRON", "RETICCTPCT" in the last 72 hours.  No results found.   Echo LVEF 45-50%  01/12/2022  TELEMETRY: :  ASSESSMENT AND PLAN:  Principal Problem:   NSTEMI (non-ST elevated myocardial infarction) (HCC) Active Problems:   S/P CABG x 2   AKI (acute kidney injury) (HCC) in the setting of stage IIIa chronic   Acute respiratory failure with hypoxia (HCC)   Unstable angina (HCC)   Malnutrition of moderate degree   Fever and chills    1.  Acute hypoxic respiratory failure, multifactorial, extubated 01/16/2022, had shortness of breath this morning, improved after nebulizer therapy, saturation 100% on 3 L, net 24-hour output 240 cc 2.  HFpEF, EF 45-50% 01/12/2022, appears euvolemic 3.  NSTEMI 01/04/2022, known occluded SVG to OM1 with patent LIMA to LAD by cardiac catheterization 06/01/2021, not felt to be amenable to percutaneous or surgical PCI 4.  AKI with underlying CKD, BUN and creatinine 40 and 1.07, respectively  Recommendations  1.  Agree with overall current therapy 2.  Uptitrate isosorbide mononitrate to 30 mg daily 3.  Furosemide IV as needed 4.  Defer further cardiac diagnostics at this time   Marcina Millard, MD, PhD, [facility] 01/20/2022 8:49 AM

## 2022-01-20 NOTE — TOC Progression Note (Signed)
Transition of Care [facility]) - Progression Note    Patient Details  Name: Lindsey Pollard MRN: [id number] Date of Birth: [birth year]  Transition of Care [facility]) CM/SW Contact  Liliana Cline, LCSW Phone Number: 01/20/2022, 10:10 AM  Clinical Narrative:    Patient has 1 bed offer at [facility]. Left VM for nephew Riley Lam requesting return call.    Expected Discharge Plan: Skilled Nursing Facility Barriers to Discharge: Continued Medical Work up  Expected Discharge Plan and Services Expected Discharge Plan: Skilled Nursing Facility   Discharge Planning Services: CM Consult Post Acute Care Choice: Skilled Nursing Facility Living arrangements for the past 2 months: Single Family Home                           HH Arranged: NA           Social Determinants of Health (SDOH) Interventions    Readmission Risk Interventions    01/18/2022    9:04 PM 01/05/2022    5:09 PM  Readmission Risk Prevention Plan  Transportation Screening Complete Complete  PCP or Specialist Appt within 3-5 Days  Complete  Social Work Consult for Recovery Care Planning/Counseling  Not Complete  SW consult not completed comments  NA  Palliative Care Screening  Not Applicable  Medication Review [facility]) Referral to Pharmacy Referral to Pharmacy  PCP or Specialist appointment within 3-5 days of discharge Complete   HRI or Home Care Consult Complete   SW Recovery Care/Counseling Consult Not Complete   SW Consult Not Complete Comments NA   Palliative Care Screening Not Applicable   Skilled Nursing Facility Complete

## 2022-01-20 NOTE — Progress Notes (Signed)
PT Cancellation Note  Patient Details Name: Lindsey Pollard MRN: [id number] DOB: 04-Nov-1940   Cancelled Treatment:     Therapist in to see pt who was resting in bed upon arrival on 3L O2, sats at 95%,  RR 24, HR 84 bpm. Pt stated she didn't feel well, slightly cool and clammy with c/o chest discomfort. Nursing notified. Will hold PT today. Continue next available date/time per pt tolerance.   Jannet Askew 01/20/2022, 1:07 PM

## 2022-01-20 NOTE — Assessment & Plan Note (Signed)
   Palliative care following outpatient  DNR  Patient will experience chronic/recurrent chest pain & shortness of breath, would like to be kept comfortable/treat symptoms rather than return to hospital  Nephew is medical decision maker, since he has medical training.  The rest of the family including patient's daughter and patient endorse this

## 2022-01-20 NOTE — Progress Notes (Signed)
Responded to call from patient.  Patient has increased WOB, patient states "I can't breath, my chest is hurting".  Patient's O2 saturation 100% on 3L [place], RR18, BP144/60, lungs bilaterally diminished.  Patient HOB increased to sitting position, assisted in position of comfort.  Dr. Lyn Hollingshead aware, prn med for WOB given as ordered.

## 2022-01-21 DIAGNOSIS — I214 Non-ST elevation (NSTEMI) myocardial infarction: Secondary | ICD-10-CM | POA: Diagnosis not present

## 2022-01-21 LAB — GLUCOSE, CAPILLARY
Glucose-Capillary: 135 mg/dL — ABNORMAL HIGH (ref 70–99)
Glucose-Capillary: 116 mg/dL — ABNORMAL HIGH (ref 70–99)
Glucose-Capillary: 124 mg/dL — ABNORMAL HIGH (ref 70–99)
Glucose-Capillary: 75 mg/dL (ref 70–99)

## 2022-01-21 MED ORDER — LORAZEPAM 2 MG/ML IJ SOLN
0.5000 mg | Freq: Once | INTRAMUSCULAR | Status: AC
  Administered 2022-01-21: 0.5 mg via INTRAVENOUS
  Filled 2022-01-21: qty 1

## 2022-01-21 MED ORDER — ALPRAZOLAM 0.25 MG PO TABS
0.2500 mg | ORAL_TABLET | Freq: Three times a day (TID) | ORAL | Status: DC | PRN
  Administered 2022-01-22 (×2): 0.25 mg via ORAL
  Filled 2022-01-21 (×2): qty 1

## 2022-01-21 NOTE — Progress Notes (Signed)
PROGRESS NOTE    STEPHENIA VOGAN  WVP:[id number] DOB: 1941/04/16  DOA: 01/11/2022 Date of Service: 01/21/22 PCP: Lauro Regulus, MD     Brief Narrative / Hospital Course:  81 year old female with a past medical history significant for recent V-fib arrest, HFpEF, ischemic cardiomyopathy, CAD status post CABG x2 in 2018 and PCI, chronic kidney disease stage III, diabetes mellitus who presents to [facility] ED on 01/11/2022 due to altered mental status, respiratory distress, and nausea/vomiting. (Of note: Was recently admitted from 7/12 through 7/18 for brief cardiac arrest in the setting of NSTEMI and acute CHF, along with acute kidney injury, discharged home with PT/OT and cardiac rehab but continued to be weak, very sleepy (more than normal), with poor p.o. intake.) 07/20: Intubated in ED, cardiology consulted for NSTEMI, admitted to PCCM. 07/21: Multiorgan failure, requiring pressors, poor prognosis 07/22, 07/23: DNR. Failed weaning trials 07/24: failed SAT/SBT d/t ischemic cardiomyopathy  07/25: No further interventions per cardiology. GOC discussion w/ Dr Belia Heman (ICU), remains DNR, agreed to extubation, increased WOB and trial of BiPap but pt did not tolerate this. Plan transfer to [facility] tomorrow.  07/26: TRH pickup from ICU. O2 requirement lower, will transfer out of SDU. PT/OT pending will likely need SNF/rehab. Temp increasing, repeat BCx, CXR, UA (no apparent UTI), resp PCR (negative), remove foley, may need to restart abx. CXR shows increased edema 07/27: lasix dose x2, WBC trending down, afebrile. BCx NG<24h. O2 saturation good on 2L but notable increased WOB on exam.  07/28: good UOP yesterday w/ Lasix. RR still occasionally high. SNF placement per TOC.  Per cardiology, patient has expressed a wish that she will eventually go home with her daughter and "be comfortable" and not have her come back to the hospital.  [facility] working on SNF placement here and will explore palliative/hospice options in  daughter's home state, patient has declined palliative consult here. 07/29-07/30: continues to have subjective SOB relieved by neb tx, SpO2 has been 97% on 3L, continued intermittent chest pain. Awaiting SNF placement.  GOC discussion with patient, see assessment/plan Progression goals: Appreciate cardiology recs re: discharge readiness, home meds on d/c and outpatient follow-up SNF placement pending.  Today is Sunday, may be in hospital through the weekend to ensure cardiac/respiratory stabilization and SNF placement Monday Palliative care to see: recs re plan outpatient to avoid hospital given readmission risk and pt has expressed desire to not come back to hospital   Consultants:  Cardiology PCCU (initially admitted to ICU)  Procedures: Intubation 07/20, extubation 07/25 Central line placement 07/20, removed 07/26    Subjective: Patient reports feeling short winded same as yesterday.  Reports intermittent chest pain, also same as yesterday.  Reports anxiety.      ASSESSMENT & PLAN:   Principal Problem:   NSTEMI (non-ST elevated myocardial infarction) (HCC) Active Problems:   Unstable angina (HCC)   Acute respiratory failure with hypoxia (HCC)   AKI (acute kidney injury) (HCC) in the setting of stage IIIa chronic   S/P CABG x 2   Malnutrition of moderate degree   Fever and chills   Goals of care, counseling/discussion   NSTEMI (non-ST elevated myocardial infarction) (HCC) Unstable angina CAD s/p CABG Ischemic cardiomyopathy HTN HFrEF Managed medically Cardiology following - no further invasive procedures planned, another 40mg  IV lasix TID today restart isosorbide 15 mg daily --> increased to 30 mg 07/29 continue low dose metoprolol XL 12.5mg , DAPT with aspirin 81 mg and 75 mg clopidogrel daily nitropaste 0.5inch q6h atorvastatin 80 mg  nightly (holding torsemide 20 mg, spironolactone 12.5 mg, lisinopril 20 mg) Cardiology states she will likely have chronic  angina from her severe CAD and she understands this  AKI (acute kidney injury) ([place]) in the setting of stage IIIa chronic Improving  Monitor serial BMP  Acute respiratory failure with hypoxia (HCC) Acute encephalopathy RESOLVED, off ventilator  Continue supplemental O2 as needed SOB likely will be chronic d/t CAD/anxiety   Fever and chills RESOLVED Temp was increasing 07/26, no cocnerns repeat BCx, CXR, UA, resp PCR remove foley  Goals of care, counseling/discussion Patient has been resistant to accepting palliative care consult.  Given that cardiology has noted she is likely to experience persistent angina/SOB and there is little to no cardiac intervention that they can do for her at this point given severity of her disease, I discussed with patient what she would want to happen if/when she develops CP/SOB outside the hospital.  Would she like to be brought to the hospital to treat aggressively as we have been doing, versus treat symptoms as able with medications at home.  I did not specifically discuss hospice/palliative care, but I did bring up that I would like to have a plan going forward for when she discharges, because if she does not want to come back to the hospital/receive aggressive treatment, we need to have a plan in place for medications to help her symptoms.  Patient states that she will think about it. On discussion again, she is amenable to talk to palliative care, will place consult and confirm they can see her Monday      DVT prophylaxis: lovenox Code Status: DNR Family Communication: d/w family at bedside yesterday  Disposition Plan / TOC needs:  SNF, TOC also following to discuss hospice/palliative options when patient eventually goes home with her daughter. Barriers to discharge / significant pending items: today is Sunday, will likely be here through the weekend pending SNF placement. Also palliative consult pending              Objective: Vitals:    01/20/22 1918 01/21/22 0440 01/21/22 0800 01/21/22 1142  BP: (!) 140/54 (!) 151/74 (!) 189/82 (!) 143/35  Pulse: 89 83 99 100  Resp: 20 20 (!) 24 18  Temp: 98 F (36.7 C) 97.6 F (36.4 C) 97.8 F (36.6 C) 97.6 F (36.4 C)  TempSrc: Oral Oral Oral Oral  SpO2: 100% 99% 96% 100%  Weight:      Height:        Intake/Output Summary (Last 24 hours) at 01/21/2022 1427 Last data filed at 01/21/2022 1407 Gross per 24 hour  Intake 720 ml  Output 450 ml  Net 270 ml   Filed Weights   01/18/22 0500 01/19/22 0409 01/20/22 0545  Weight: 61.3 kg 60.8 kg 61.1 kg    Examination:  Constitutional:  VS as above General Appearance: alert, frail but not cachectic, NAD Eyes: Normal lids and conjunctive, non-icteric sclera Ears, Nose, Mouth, Throat: Normal appearance externally Neck: No masses, trachea midline Respiratory: Increased respiratory rate, no accessory muscle use -when I walked into the room patient appeared to be breathing deeply but not rapidly while asleep, on awakening she definitely becomes tachypneic. Breath sounds normal, no wheeze/rhonchi/rales but poor inspiratory effort  Cardiovascular: S1/S2 normal, no murmur/rub/gallop auscultated No lower extremity edema Gastrointestinal: Nontender, no masses Musculoskeletal:  No clubbing/cyanosis of digits Neurological: No cranial nerve deficit on limited exam Psychiatric: Normal judgment/insight Anxious mood and affect       Scheduled Medications:  aspirin EC  81 mg Oral Daily   atorvastatin  80 mg Oral QHS   Chlorhexidine Gluconate Cloth  6 each Topical Daily   clopidogrel  75 mg Oral Daily   dextromethorphan-guaiFENesin  1 tablet Oral BID   enoxaparin (LOVENOX) injection  40 mg Subcutaneous Q24H   feeding supplement  237 mL Oral TID BM   insulin aspart  0-15 Units Subcutaneous TID AC & HS   isosorbide mononitrate  30 mg Oral Daily   metoprolol succinate  12.5 mg Oral Daily   multivitamin with minerals  1 tablet  Oral Daily   pantoprazole  40 mg Oral Daily    Continuous Infusions:  sodium chloride Stopped (01/15/22 0502)    PRN Medications:  acetaminophen, docusate sodium, ipratropium-albuterol, loperamide, morphine injection, nitroGLYCERIN, ondansetron (ZOFRAN) IV, mouth rinse, polyethylene glycol  Antimicrobials:  Anti-infectives (From admission, onward)    Start     Dose/Rate Route Frequency Ordered Stop   01/11/22 1200  piperacillin-tazobactam (ZOSYN) IVPB 3.375 g        3.375 g 12.5 mL/hr over 240 Minutes Intravenous Every 8 hours 01/11/22 1106 01/16/22 0149   01/11/22 0915  cefTRIAXone (ROCEPHIN) 1 g in sodium chloride 0.9 % 100 mL IVPB  Status:  Discontinued        1 g 200 mL/hr over 30 Minutes Intravenous Every 24 hours 01/11/22 0903 01/11/22 1106       Data Reviewed: I have personally reviewed following labs and imaging studies  CBC: Recent Labs  Lab 01/15/22 0456 01/17/22 1153 01/18/22 0419 01/20/22 0451  WBC 9.7 13.2* 11.7* 11.4*  NEUTROABS 7.9*  --  8.3*  --   HGB 7.2* 8.1* 8.2* 9.0*  HCT 23.1* 26.3* 26.4* 28.1*  MCV 98.3 100.8* 96.7 96.2  PLT 264 361 351 [id number]*   Basic Metabolic Panel: Recent Labs  Lab 01/15/22 0456 01/16/22 0443 01/17/22 0330 01/18/22 0419 01/19/22 0511 01/20/22 0451  NA 138 141 139 135 135 135  K 3.7 3.1* 3.7 4.2 3.4* 3.6  CL 102 104 103 99 98 96*  CO2 28 27 27 24 30 30   GLUCOSE 188* 168* 98 100* 105* 172*  BUN 37* 48* 49* 52* 49* 40*  CREATININE 1.31* 1.36* 1.20* 1.06* 1.00 1.07*  CALCIUM 7.9* 8.3* 8.5* 8.5* 8.3* 8.9  MG 2.2  --   --   --   --   --   PHOS 3.9  --   --   --   --   --    GFR: Estimated Creatinine Clearance: 33.4 mL/min (A) (by C-G formula based on SCr of 1.07 mg/dL (H)). Liver Function Tests: Recent Labs  Lab 01/15/22 0456 01/18/22 0419  AST 16 21  ALT 17 16  ALKPHOS 65 56  BILITOT 0.7 0.8  PROT 5.8* 6.1*  ALBUMIN 2.6* 2.9*   No results for input(s): "LIPASE", "AMYLASE" in the last 168 hours. No results  for input(s): "AMMONIA" in the last 168 hours. Coagulation Profile: No results for input(s): "INR", "PROTIME" in the last 168 hours.  Cardiac Enzymes: No results for input(s): "CKTOTAL", "CKMB", "CKMBINDEX", "TROPONINI" in the last 168 hours. BNP (last 3 results) No results for input(s): "PROBNP" in the last 8760 hours. HbA1C: No results for input(s): "HGBA1C" in the last 72 hours. CBG: Recent Labs  Lab 01/20/22 1127 01/20/22 1549 01/20/22 2055 01/21/22 0755 01/21/22 1138  GLUCAP 130* 113* 270* 135* 116*   Lipid Profile: No results for input(s): "CHOL", "HDL", "LDLCALC", "TRIG", "CHOLHDL", "LDLDIRECT" in the last  72 hours.  Thyroid Function Tests: No results for input(s): "TSH", "T4TOTAL", "FREET4", "T3FREE", "THYROIDAB" in the last 72 hours. Anemia Panel: No results for input(s): "VITAMINB12", "FOLATE", "FERRITIN", "TIBC", "IRON", "RETICCTPCT" in the last 72 hours. Urine analysis:    Component Value Date/Time   COLORURINE STRAW (A) 01/17/2022 1832   APPEARANCEUR CLEAR (A) 01/17/2022 1832   LABSPEC 1.006 01/17/2022 1832   PHURINE 5.0 01/17/2022 1832   GLUCOSEU NEGATIVE 01/17/2022 1832   HGBUR NEGATIVE 01/17/2022 1832   BILIRUBINUR NEGATIVE 01/17/2022 1832   KETONESUR NEGATIVE 01/17/2022 1832   PROTEINUR NEGATIVE 01/17/2022 1832   NITRITE NEGATIVE 01/17/2022 1832   LEUKOCYTESUR NEGATIVE 01/17/2022 1832   Sepsis Labs: @LABRCNTIP (procalcitonin:4,lacticidven:4)  Recent Results (from the past 240 hour(s))  Culture, blood (Routine X 2) w Reflex to ID Panel     Status: None (Preliminary result)   Collection Time: 01/17/22  6:00 PM   Specimen: BLOOD  Result Value Ref Range Status   Specimen Description BLOOD RIGHT ANTECUBITAL  Final   Special Requests   Final    BOTTLES DRAWN AEROBIC AND ANAEROBIC Blood Culture adequate volume   Culture   Final    NO GROWTH 4 DAYS Performed at [facility], [address], [place], [place]    Report Status PENDING   Incomplete  Culture, blood (Routine X 2) w Reflex to ID Panel     Status: None (Preliminary result)   Collection Time: 01/17/22  6:00 PM   Specimen: BLOOD  Result Value Ref Range Status   Specimen Description BLOOD BLOOD LEFT WRIST  Final   Special Requests   Final    BOTTLES DRAWN AEROBIC AND ANAEROBIC Blood Culture adequate volume   Culture   Final    NO GROWTH 4 DAYS Performed at [facility], [address] [place], [place]    Report Status PENDING  Incomplete  Remove urinary catheter to obtain Clean Catch urine culture     Status: None   Collection Time: 01/17/22  6:32 PM   Specimen: Urine, Clean Catch  Result Value Ref Range Status   Specimen Description   Final    URINE, CLEAN CATCH Performed at [facility], [address], [place], [place]    Special Requests   Final    NONE Performed at [facility], [address], [place], [place]    Culture   Final    NO GROWTH Performed at [facility], [address], [place], [place]    Report Status 01/18/2022 FINAL  Final  Respiratory (~20 pathogens) panel by PCR     Status: None   Collection Time: 01/17/22  6:32 PM   Specimen: Nasopharyngeal Swab; Respiratory  Result Value Ref Range Status   Adenovirus NOT DETECTED NOT DETECTED Final   Coronavirus 229E NOT DETECTED NOT DETECTED Final    Comment: (NOTE) The Coronavirus on the Respiratory Panel, DOES NOT test for the novel  Coronavirus (2019 nCoV)    Coronavirus HKU1 NOT DETECTED NOT DETECTED Final   Coronavirus NL63 NOT DETECTED NOT DETECTED Final   Coronavirus OC43 NOT DETECTED NOT DETECTED Final   Metapneumovirus NOT DETECTED NOT DETECTED Final   Rhinovirus / Enterovirus NOT DETECTED NOT DETECTED Final   Influenza A NOT DETECTED NOT DETECTED Final   Influenza B NOT DETECTED NOT DETECTED Final   Parainfluenza Virus 1 NOT DETECTED NOT DETECTED Final   Parainfluenza Virus 2 NOT DETECTED NOT  DETECTED Final  Parainfluenza Virus 3 NOT DETECTED NOT DETECTED Final   Parainfluenza Virus 4 NOT DETECTED NOT DETECTED Final   Respiratory Syncytial Virus NOT DETECTED NOT DETECTED Final   Bordetella pertussis NOT DETECTED NOT DETECTED Final   Bordetella Parapertussis NOT DETECTED NOT DETECTED Final   Chlamydophila pneumoniae NOT DETECTED NOT DETECTED Final   Mycoplasma pneumoniae NOT DETECTED NOT DETECTED Final    Comment: Performed at [facility], [place] [address] [place], [place] [address]         Radiology Studies last 96 hours: DG Chest Port 1 View  Result Date: 01/17/2022 CLINICAL DATA:  Fever, shortness of breath EXAM: PORTABLE CHEST 1 VIEW COMPARISON:  Previous studies including the examination of 01/15/2022 FINDINGS: Transverse diameter of Elnoria Howard is increased. Central pulmonary vessels are prominent. Increased interstitial markings are seen in the parahilar regions. Small to moderate bilateral pleural effusions are seen, more so on the left side with interval increase. Evaluation of left lower lung field for infiltrates is limited by the effusion. There is no pneumothorax. There is interval removal of endotracheal tube, central venous catheter and enteric tube. IMPRESSION: Central pulmonary vessels are prominent with increased interstitial markings in the parahilar regions suggesting CHF. Small to moderate bilateral pleural effusions, more so on the left side with interval increase. Electronically Signed   By: Elmer Picker M.D.   On: 01/17/2022 17:38            LOS: 10 days       Emeterio Reeve, DO [facility] 01/21/2022, 2:27 PM   Staff may message me via secure chat in [place]  but this may not receive immediate response,  please page for urgent matters!  If 7PM-7AM, please contact night-coverage [email or web address]  Dictation software was used to generate the above note. Typos may occur and escape review, as with typed/written notes. Please  contact Dr Sheppard Coil directly for clarity if needed.

## 2022-01-21 NOTE — Progress Notes (Signed)
PT Cancellation Note  Patient Details Name: Lindsey Pollard MRN: [id number] DOB: May 15, 1941   Cancelled Treatment:    Reason Eval/Treat Not Completed: Medical issues which prohibited therapy  Chart reviewed and discussed with RN.  RN requested hold session today.  Will continue at a later time/date.   Danielle Dess 01/21/2022, 10:50 AM

## 2022-01-21 NOTE — Progress Notes (Addendum)
"  I can't breath..I had another attack earlier, it is coming on again..".  Patient O2 saturation 96% on 3L [place], with increased WOB\, lung sounds diminished but clear bilaterally at this time.  PRN med for WOB given, Dr. Lyn Hollingshead made aware.

## 2022-01-21 NOTE — TOC Progression Note (Addendum)
Transition of Care [facility]) - Progression Note    Patient Details  Name: Lindsey Pollard MRN: [id number] Date of Birth: 1941-03-21  Transition of Care [facility]) CM/SW Contact  Liliana Cline, LCSW Phone Number: 01/21/2022, 3:49 PM  Clinical Narrative:    Left VM for patient's nephew requesting return call to discuss bed offers [employer], [facility])  4:32- Spoke to nephew who requested bed search be extended to [place] area. He requested a call back tomorrow after 4pm as he works night shift. He stated if they do not get bed offers with high ratings through Medicare.gov in [place], his second choice would be [facility]. Extended bed search. Updated TOC handoff to  follow up tomorrow afternoon.   Expected Discharge Plan: Skilled Nursing Facility Barriers to Discharge: Continued Medical Work up  Expected Discharge Plan and Services Expected Discharge Plan: Skilled Nursing Facility   Discharge Planning Services: CM Consult Post Acute Care Choice: Skilled Nursing Facility Living arrangements for the past 2 months: Single Family Home                           HH Arranged: NA           Social Determinants of Health (SDOH) Interventions    Readmission Risk Interventions    01/18/2022    9:04 PM 01/05/2022    5:09 PM  Readmission Risk Prevention Plan  Transportation Screening Complete Complete  PCP or Specialist Appt within 3-5 Days  Complete  Social Work Consult for Recovery Care Planning/Counseling  Not Complete  SW consult not completed comments  NA  Palliative Care Screening  Not Applicable  Medication Review [employer]) Referral to Pharmacy Referral to Pharmacy  PCP or Specialist appointment within 3-5 days of discharge Complete   HRI or Home Care Consult Complete   SW Recovery Care/Counseling Consult Not Complete   SW Consult Not Complete Comments NA   Palliative Care Screening Not Applicable   Skilled Nursing Facility Complete

## 2022-01-21 NOTE — Progress Notes (Signed)
Patient resting comfortably in bed, no distress.

## 2022-01-21 NOTE — Progress Notes (Signed)
[facility] Cardiology  SUBJECTIVE: Patient laying in bed, denies chest pain, but does report recurrent episodes of shortness of breath   Vitals:   01/20/22 1525 01/20/22 1918 01/21/22 0440 01/21/22 0800  BP: (!) 142/61 (!) 140/54 (!) 151/74 (!) 189/82  Pulse: 82 89 83 99  Resp: 18 20 20    Temp: 98.3 F (36.8 C) 98 F (36.7 C) 97.6 F (36.4 C)   TempSrc: Oral Oral Oral   SpO2: 100% 100% 99% 96%  Weight:      Height:         Intake/Output Summary (Last 24 hours) at 01/21/2022 0902 Last data filed at 01/21/2022 01/23/2022 Gross per 24 hour  Intake 660 ml  Output 450 ml  Net 210 ml      PHYSICAL EXAM  General: Well developed, well nourished, in no acute distress HEENT:  Normocephalic and atramatic Neck:  No JVD.  Lungs: Clear bilaterally to auscultation and percussion. Heart: HRRR . Normal S1 and S2 without gallops or murmurs.  Abdomen: Bowel sounds are positive, abdomen soft and non-tender  Msk:  Back normal, normal gait. Normal strength and tone for age. Extremities: No clubbing, cyanosis or edema.   Neuro: Alert and oriented X 3. Psych:  Good affect, responds appropriately   LABS: Basic Metabolic Panel: Recent Labs    01/19/22 0511 01/20/22 0451  NA 135 135  K 3.4* 3.6  CL 98 96*  CO2 30 30  GLUCOSE 105* 172*  BUN 49* 40*  CREATININE 1.00 1.07*  CALCIUM 8.3* 8.9   Liver Function Tests: No results for input(s): "AST", "ALT", "ALKPHOS", "BILITOT", "PROT", "ALBUMIN" in the last 72 hours. No results for input(s): "LIPASE", "AMYLASE" in the last 72 hours. CBC: Recent Labs    01/20/22 0451  WBC 11.4*  HGB 9.0*  HCT 28.1*  MCV 96.2  PLT 465*   Cardiac Enzymes: No results for input(s): "CKTOTAL", "CKMB", "CKMBINDEX", "TROPONINI" in the last 72 hours. BNP: Invalid input(s): "POCBNP" D-Dimer: No results for input(s): "DDIMER" in the last 72 hours. Hemoglobin A1C: No results for input(s): "HGBA1C" in the last 72 hours. Fasting Lipid Panel: No results for  input(s): "CHOL", "HDL", "LDLCALC", "TRIG", "CHOLHDL", "LDLDIRECT" in the last 72 hours. Thyroid Function Tests: No results for input(s): "TSH", "T4TOTAL", "T3FREE", "THYROIDAB" in the last 72 hours.  Invalid input(s): "FREET3" Anemia Panel: No results for input(s): "VITAMINB12", "FOLATE", "FERRITIN", "TIBC", "IRON", "RETICCTPCT" in the last 72 hours.  No results found.   Echo LVEF 45-50% 01/12/2022  TELEMETRY:  ASSESSMENT AND PLAN:  Principal Problem:   NSTEMI (non-ST elevated myocardial infarction) (HCC) Active Problems:   S/P CABG x 2   AKI (acute kidney injury) (HCC) in the setting of stage IIIa chronic   Acute respiratory failure with hypoxia (HCC)   Unstable angina (HCC)   Malnutrition of moderate degree   Fever and chills   Goals of care, counseling/discussion    1. Acute hypoxic respiratory failure, multifactorial, extubated 01/16/2022, continues to have episodic shortness of breath, oxygen saturation 100% on 3 L, net 24-hour output  plus 270 cc 2.  HFpEF, EF 45-50% 01/12/2022, appears euvolemic 3.  NSTEMI 01/04/2022, known occluded SVG to OM1 with patent LIMA to LAD by cardiac catheterization 06/01/2021, not felt to be amenable to percutaneous or surgical PCI 4.  AKI with underlying CKD, BUN and creatinine 49 and 1.00, respectively   Recommendations   1.  Agree with overall current therapy 2.  Continue isosorbide mononitrate to 30 mg daily 3.  Furosemide  IV as needed 4.  Defer further cardiac diagnostics at this time   Marcina Millard, MD, PhD, [facility] 01/21/2022 9:02 AM

## 2022-01-22 ENCOUNTER — Inpatient Hospital Stay: Payer: Medicare Other

## 2022-01-22 DIAGNOSIS — Z7189 Other specified counseling: Secondary | ICD-10-CM

## 2022-01-22 DIAGNOSIS — R54 Age-related physical debility: Secondary | ICD-10-CM

## 2022-01-22 DIAGNOSIS — I214 Non-ST elevation (NSTEMI) myocardial infarction: Secondary | ICD-10-CM | POA: Diagnosis not present

## 2022-01-22 LAB — CBC
HCT: 29.2 % — ABNORMAL LOW (ref 36.0–46.0)
Hemoglobin: 8.9 g/dL — ABNORMAL LOW (ref 12.0–15.0)
MCH: 30.2 pg (ref 26.0–34.0)
MCHC: 30.5 g/dL (ref 30.0–36.0)
MCV: 99 fL (ref 80.0–100.0)
Platelets: 343 10*3/uL (ref 150–400)
RBC: 2.95 MIL/uL — ABNORMAL LOW (ref 3.87–5.11)
RDW: 14.7 % (ref 11.5–15.5)
WBC: 8.9 10*3/uL (ref 4.0–10.5)
nRBC: 0 % (ref 0.0–0.2)

## 2022-01-22 LAB — GLUCOSE, CAPILLARY
Glucose-Capillary: 101 mg/dL — ABNORMAL HIGH (ref 70–99)
Glucose-Capillary: 131 mg/dL — ABNORMAL HIGH (ref 70–99)
Glucose-Capillary: 137 mg/dL — ABNORMAL HIGH (ref 70–99)
Glucose-Capillary: 92 mg/dL (ref 70–99)

## 2022-01-22 LAB — BASIC METABOLIC PANEL
Anion gap: 11 (ref 5–15)
BUN: 31 mg/dL — ABNORMAL HIGH (ref 8–23)
CO2: 29 mmol/L (ref 22–32)
Calcium: 8.6 mg/dL — ABNORMAL LOW (ref 8.9–10.3)
Chloride: 97 mmol/L — ABNORMAL LOW (ref 98–111)
Creatinine, Ser: 0.74 mg/dL (ref 0.44–1.00)
GFR, Estimated: >60 mL/min (ref >60)
Glucose, Bld: 98 mg/dL (ref 70–99)
Potassium: 4.4 mmol/L (ref 3.5–5.1)
Sodium: 137 mmol/L (ref 135–145)

## 2022-01-22 LAB — CULTURE, BLOOD (ROUTINE X 2)
Culture: NO GROWTH
Culture: NO GROWTH
Special Requests: ADEQUATE
Special Requests: ADEQUATE

## 2022-01-22 MED ORDER — ALPRAZOLAM 0.25 MG PO TABS
0.1250 mg | ORAL_TABLET | Freq: Three times a day (TID) | ORAL | Status: DC | PRN
  Administered 2022-01-23: 0.125 mg via ORAL
  Filled 2022-01-22: qty 1

## 2022-01-22 MED ORDER — LISINOPRIL 10 MG PO TABS: 10.0000 mg | ORAL_TABLET | Freq: Every day | ORAL | Status: DC

## 2022-01-22 MED ORDER — LISINOPRIL 10 MG PO TABS
10.0000 mg | ORAL_TABLET | Freq: Every day | ORAL | Status: DC
  Administered 2022-01-23: 10 mg via ORAL
  Filled 2022-01-22: qty 1

## 2022-01-22 MED ORDER — METOPROLOL SUCCINATE ER 25 MG PO TB24
25.0000 mg | ORAL_TABLET | Freq: Every day | ORAL | Status: DC
  Administered 2022-01-22 – 2022-01-23 (×2): 25 mg via ORAL
  Filled 2022-01-22: qty 1

## 2022-01-22 MED ORDER — ISOSORBIDE MONONITRATE ER 30 MG PO TB24
60.0000 mg | ORAL_TABLET | Freq: Every day | ORAL | Status: DC
Start: 2022-01-22 — End: 2022-01-23
  Administered 2022-01-22 – 2022-01-23 (×2): 60 mg via ORAL
  Filled 2022-01-22: qty 2

## 2022-01-22 MED ORDER — FUROSEMIDE 10 MG/ML IJ SOLN
40.0000 mg | Freq: Once | INTRAMUSCULAR | Status: AC
  Administered 2022-01-22: 40 mg via INTRAVENOUS
  Filled 2022-01-22: qty 4

## 2022-01-22 NOTE — Care Management Important Message (Signed)
Important Message  Patient Details  Name: Lindsey Pollard MRN: [id number] Date of Birth: Oct 23, 1940   Medicare Important Message Given:  Yes     Johnell Comings 01/22/2022, 11:41 AM

## 2022-01-22 NOTE — Progress Notes (Signed)
Physical Therapy Treatment Patient Details Name: Lindsey Pollard MRN: [id number] DOB: [birth year] Today's Date: 01/22/2022   History of Present Illness 81 year old female who presents to [facility] ED on 01/11/2022 due to altered mental status, respiratory distress, and nausea/vomiting. Pt intubated 7/20-25, She was recently admitted 7/12 - 18 for NSTEMI and acute CHF, along with acute kidney injury, discharged home with PT/OT and cardiac rehab but continued to be weakwith a past medical history significant for recent V-fib arrest, HFpEF, ischemic cardiomyopathy, CAD status post CABG x2 in 2018 and PCI, chronic kidney disease stage III, diabetes mellitus.    PT Comments    Pt seen this pm, family in room, pt resting in bed, agreed to PT. Supine to sit with MinA and HOB raised. Pt sat EOB x 15 minutes with feet supported. Good demonstration of posture, no LOB, O2 sats 100% on 2L. Attempted standing twice, with/without RW, MaxA to attain standing, unable to weight shift to take a step. Pt remains motivated to get stronger and hopes to ultimately live with her daughter in a one level home with ramp.   Recommendations for follow up therapy are one component of a multi-disciplinary discharge planning process, led by the attending physician.  Recommendations may be updated based on patient status, additional functional criteria and insurance authorization.  Follow Up Recommendations  Skilled nursing-short term rehab (<3 hours/day) Can patient physically be transported by private vehicle: No   Assistance Recommended at Discharge Frequent or constant Supervision/Assistance  Patient can return home with the following Two people to help with walking and/or transfers;A lot of help with walking and/or transfers;A lot of help with bathing/dressing/bathroom;Assistance with cooking/housework;Assist for transportation;Help with stairs or ramp for entrance   Equipment Recommendations  None recommended by PT     Recommendations for Other Services       Precautions / Restrictions Precautions Precautions: Fall Restrictions Weight Bearing Restrictions: No     Mobility  Bed Mobility Overal bed mobility: Needs Assistance Bed Mobility: Supine to Sit, Sit to Supine     Supine to sit: Min assist, HOB elevated Sit to supine: Mod assist   General bed mobility comments: Pt sat EOB x 15 minutes with supervision    Transfers Overall transfer level: Needs assistance Equipment used: Rolling walker (2 wheels) Transfers: Sit to/from Stand Sit to Stand: Max assist           General transfer comment: Only able to stand, unable t advance feet    Ambulation/Gait                   Stairs             Wheelchair Mobility    Modified Rankin (Stroke Patients Only)       Balance Overall balance assessment: Needs assistance Sitting-balance support: Feet supported, No upper extremity supported Sitting balance-Leahy Scale: Good                                      Cognition Arousal/Alertness: Awake/alert Behavior During Therapy: WFL for tasks assessed/performed, Flat affect Overall Cognitive Status: Within Functional Limits for tasks assessed                                 General Comments: Family present        Exercises General Exercises - Lower Extremity Ankle Circles/Pumps:  AROM, Both, 10 reps Long Arc Quad: AROM, Both, 5 reps    General Comments General comments (skin integrity, edema, etc.): Pt remained on 2L O2 with sats remaining at 98-100      Pertinent Vitals/Pain Pain Assessment Pain Assessment: No/denies pain    Home Living                          Prior Function            PT Goals (current goals can now be found in the care plan section) Acute Rehab PT Goals Patient Stated Goal: get better, go home Progress towards PT goals: Progressing toward goals    Frequency    Min 2X/week      PT  Plan Current plan remains appropriate    Co-evaluation              AM-PAC PT "6 Clicks" Mobility   Outcome Measure  Help needed turning from your back to your side while in a flat bed without using bedrails?: A Little Help needed moving from lying on your back to sitting on the side of a flat bed without using bedrails?: A Lot Help needed moving to and from a bed to a chair (including a wheelchair)?: Total Help needed standing up from a chair using your arms (e.g., wheelchair or bedside chair)?: Total Help needed to walk in hospital room?: Total Help needed climbing 3-5 steps with a railing? : Total 6 Click Score: 9    End of Session Equipment Utilized During Treatment: Gait belt;Oxygen Activity Tolerance: Patient tolerated treatment well Patient left: in bed;with call bell/phone within reach;with bed alarm set;with family/visitor present Nurse Communication: Mobility status PT Visit Diagnosis: Muscle weakness (generalized) (M62.81)     Time: 5885-0277 PT Time Calculation (min) (ACUTE ONLY): 32 min  Charges:  $Therapeutic Activity: 23-37 mins                    Zadie Cleverly, PTA   Jannet Askew 01/22/2022, 5:47 PM

## 2022-01-22 NOTE — Consult Note (Signed)
Consultation Note Date: 01/22/2022   Patient Name: Lindsey Pollard  DOB: 08/19/1940  MRN: [id number]  Age / Sex: 81 y.o., female  PCP: [name], MD Referring Physician: Emeterio Reeve, DO  Reason for Consultation:  [place], outpatient plan  HPI/Patient Profile: 81 y.o. female  with past medical history of V-fib arrest, NSTEMI, ischemic cardiomyopathy, CAD, CKD III, DM  admitted on 01/11/2022 with altered mental status, respiratory distress, vomiting. She was intubated for airway protection- has been extubated. Workup this hospitalization reveals NSTEMI and acute kidney injury. She has expressed her wishes not to come back to the hospital. Palliative medicine consulted for [place].   Primary Decision Maker PATIENT-= she has an HCPOA dedicating her nephew Nathaneil Canary as her preferred decision maker if she is unable. Not on chart. Her daughter notes that she understands her mom's preference and is agreeable to [facility] being Air traffic controller for patient if needed  Discussion: Chart reviewed including labs, progress notes from this and previous admissions, imaging.  Received message from dietician indicating concern for poor po intake and questioning possible feeding tube placement.  Met with patient, her daughter [name] and her friend Butch Penny.  Patient and Butch Penny moved to [place] from [place] to be closer to Donna's son, Nathaneil Canary (who subsequently moved to [place])- daughter [name] lives in [place].  Conner has been a caretaker for her friend Butch Penny until recently. We discussed Audie's current illness, possible trajectories, and options for the future, encouraged her to express her own goals of care.  Eleanna shares that her main concern is to attempt to get strong enough to be able to tolerate the trip to [place] to live out her time with her daughter.  Kourtnee notes that if she discharged and had physical  decline- she would not want to be rehospitalized - she would only want her symptoms managed and be allowed to die peacefully. [name] expressed that Tanna did not want to be resuscitated or intubated even prior to this admission.  Options were reviewed for transition to comfort care now, with Hospice which would be supplemental but not 24 hour care in her home vs d/c to rehab with Palliative, with transition to Hospice if she declined- with GOC to be getting her to a state where she could tolerate a car ride or plane ride to [place].  We discussed her po intake. She notes that she has a preference for sweet food items. Her family brought her a strawberry milkshake from [facility] and she ate all of it.  She does not want any artificial feeding.     SUMMARY OF RECOMMENDATIONS -Continue current plan of care -TOC order for referral to oupt Palliative with transition to comfort/hospice if she has further decline- would not want rehospitalization  -Decreased po intake- recommend liberalizing diet- I encouraged family to bring foods that patient enjoys- eat small, frequent meals, she is drinking her Ensure   Code Status/Advance Care Planning: DNR   Prognosis:   Unable to determine  Discharge Planning: [place] for  rehab with Palliative care service follow-up  Primary Diagnoses: Present on Admission:  NSTEMI (non-ST elevated myocardial infarction) ([place])  Unstable angina ([place])  Acute respiratory failure with hypoxia ([place])  AKI (acute kidney injury) ([place]) in the setting of stage IIIa chronic   Review of Systems  Physical Exam  Vital Signs: BP (!) 159/64 (BP Location: Left Arm)   Pulse 81   Temp 97.6 F (36.4 C) (Oral)   Resp 17   Ht 4' 11.02" (1.499 m)   Wt 57.1 kg   SpO2 100%   BMI 25.39 kg/m  Pain Scale: 0-10 POSS *See Group Information*: 1-Acceptable,Awake and alert Pain Score: Asleep   SpO2: SpO2: 100 % O2 Device:SpO2: 100 % O2 Flow Rate: .O2 Flow Rate  (L/min): 3.5 L/min  IO: Intake/output summary:  Intake/Output Summary (Last 24 hours) at 01/22/2022 1621 Last data filed at 01/22/2022 1006 Gross per 24 hour  Intake 180 ml  Output 200 ml  Net -20 ml    LBM: Last BM Date : 01/21/22 Baseline Weight: Weight: 63.7 kg Most recent weight: Weight: 57.1 kg       Thank you for this consult. Palliative medicine will continue to follow and assist as needed.  Time Total: 90 minutes Greater than 50%  of this time was spent counseling and coordinating care related to the above assessment and plan.  Signed by: Mariana Kaufman, AGNP-C Palliative Medicine    Please contact Palliative Medicine Team phone at [phone] for questions and concerns.  For individual provider: See Shea Evans

## 2022-01-22 NOTE — Progress Notes (Addendum)
PROGRESS NOTE    Lindsey Pollard  [id number] DOB: 1940-10-02  DOA: 01/11/2022 Date of Service: 01/22/22 PCP: [name], MD     Brief Narrative / Hospital Course:  81 year old female with a past medical history significant for recent V-fib arrest, HFpEF, ischemic cardiomyopathy, CAD status post CABG x2 in 2018 and PCI, chronic kidney disease stage III, diabetes mellitus who presents to [facility] ED on 01/11/2022 due to altered mental status, respiratory distress, and nausea/vomiting. (Of note: Was recently admitted from 7/12 through 7/18 for brief cardiac arrest in the setting of NSTEMI and acute CHF, along with acute kidney injury, discharged home with PT/OT and cardiac rehab but continued to be weak, very sleepy (more than normal), with poor p.o. intake.) 07/20: Intubated in ED, cardiology consulted for NSTEMI, admitted to PCCM. 07/21: Multiorgan failure, requiring pressors, poor prognosis 07/22, 07/23: DNR. Failed weaning trials 07/24: failed SAT/SBT d/t ischemic cardiomyopathy  07/25: No further interventions per cardiology. GOC discussion w/ Dr Mortimer Fries (ICU), remains DNR, agreed to extubation, increased WOB and trial of BiPap but pt did not tolerate this. Plan transfer to [facility] tomorrow.  07/26: TRH pickup from ICU. O2 requirement lower, will transfer out of SDU. PT/OT pending will likely need SNF/rehab. Temp increasing, repeat BCx, CXR, UA (no apparent UTI), resp PCR (negative), remove foley, may need to restart abx. CXR shows increased edema 07/27: lasix dose x2, WBC trending down, afebrile. BCx NG<24h. O2 saturation good on 2L but notable increased WOB on exam.  07/28: good UOP yesterday w/ Lasix. RR still occasionally high. SNF placement per TOC.  Per cardiology, patient has expressed a wish that she will eventually go home with her daughter and "be comfortable" and not have her come back to the hospital.  [facility] working on SNF placement here and will explore palliative/hospice options in  daughter's home state, patient has declined palliative consult here. 07/29-07/31: continues to have subjective SOB relieved by neb tx, SpO2 has been 97% on 3L, continued intermittent chest pain. Cardiology has increased metoprolol and isosrobide, repeat CXR 07/31 and dose lasix IV x1 plan to d/c likely on torsemide 10 mg daily. Awaiting SNF placement - searching in [place].  [place] discussion with patient, see assessment/plan. Palliative consulted, recs pending.  Progression goals: Appreciate cardiology recs re: discharge readiness, home meds on d/c and outpatient follow-up SNF placement pending.  Palliative care to see: recs re plan outpatient to avoid hospital given readmission risk and pt has expressed desire to not come back to hospital   Consultants:  Cardiology PCCU (initially admitted to ICU)  Procedures: Intubation 07/20, extubation 07/25 Central line placement 07/20, removed 07/26    Subjective: Patient reports feeling short winded same as yesterday.  Reports intermittent chest pain, also same as yesterday.  Daughter is concerned aptient is not eating d/t feeling SOB.      ASSESSMENT & PLAN:   Principal Problem:   NSTEMI (non-ST elevated myocardial infarction) ([place]) Active Problems:   Unstable angina (HCC)   Acute respiratory failure with hypoxia (HCC)   AKI (acute kidney injury) ([place]) in the setting of stage IIIa chronic   S/P CABG x 2   Malnutrition of moderate degree   Fever and chills   Goals of care, counseling/discussion   NSTEMI (non-ST elevated myocardial infarction) ([place]) Unstable angina CAD s/p CABG Ischemic cardiomyopathy HTN HFrEF Managed medically Cardiology following - no further invasive procedures planned, another 40mg  IV lasix TID today restart isosorbide 15 mg daily --> increased to 30 mg 07/29 -->  60 mg 07/31 metoprolol XL 12.5mg  --> 25 mg  Restarted lisinopril 10 mg daily  DAPT with aspirin 81 mg and 75 mg clopidogrel daily nitropaste 0.5inch  q6h atorvastatin 80 mg nightly (holding torsemide 20 mg, spironolactone 12.5 mg) Cardiology states she will likely have chronic angina from her severe CAD --> palliative consult pending   AKI (acute kidney injury) (HCC) in the setting of stage IIIa chronic Improving  Monitor serial BMP  Acute respiratory failure with hypoxia (HCC) Acute encephalopathy RESOLVED, off ventilator  Continue supplemental O2 as needed SOB likely will be chronic d/t CAD/anxiety  Lowered dose alprazolam   Fever and chills RESOLVED Temp was increasing 07/26, no cocnerns repeat BCx, CXR, UA, resp PCR remove foley  Goals of care, counseling/discussion Patient has been resistant to accepting palliative care consult.  Given that cardiology has noted she is likely to experience persistent angina/SOB and there is little to no cardiac intervention that they can do for her at this point given severity of her disease, I discussed with patient what she would want to happen if/when she develops CP/SOB outside the hospital.  Would she like to be brought to the hospital to treat aggressively as we have been doing, versus treat symptoms as able with medications at home.  I did not specifically discuss hospice/palliative care, but I did bring up that I would like to have a plan going forward for when she discharges, because if she does not want to come back to the hospital/receive aggressive treatment, we need to have a plan in place for medications to help her symptoms.  Patient states that she will think about it. On discussion again, she is amenable to talk to palliative care, will place consult and confirm they can see her Monday      DVT prophylaxis: lovenox Code Status: DNR Family Communication: d/w family at bedside today and called nephew Riley Lam today as well   Disposition Plan / TOC needs:  SNF, TOC also following to discuss hospice/palliative options when patient eventually goes home with her daughter. Barriers to  discharge / significant pending items: SNF placement in [place] pending, palliative consult pending              Objective: Vitals:   01/22/22 0500 01/22/22 0705 01/22/22 0812 01/22/22 0826  BP:  (!) 161/71  (!) 159/64  Pulse:  88  81  Resp:   14 17  Temp:   97.6 F (36.4 C) 97.6 F (36.4 C)  TempSrc:   Axillary Oral  SpO2:  99%  100%  Weight: 57.1 kg     Height:        Intake/Output Summary (Last 24 hours) at 01/22/2022 1705 Last data filed at 01/22/2022 1006 Gross per 24 hour  Intake 180 ml  Output --  Net 180 ml   Filed Weights   01/19/22 0409 01/20/22 0545 01/22/22 0500  Weight: 60.8 kg 61.1 kg 57.1 kg    Examination:  Constitutional:  VS as above General Appearance: alert, frail but not cachectic, NAD Eyes: Normal lids and conjunctive, non-icteric sclera Ears, Nose, Mouth, Throat: Normal appearance externally Neck: No masses, trachea midline Respiratory: Increased respiratory rate, no accessory muscle use -when I walked into the room patient appeared to be breathing deeply but not rapidly while asleep, on awakening she definitely becomes tachypneic. Breath sounds normal, no wheeze/rhonchi/rales but poor inspiratory effort  Cardiovascular: S1/S2 normal, no murmur/rub/gallop auscultated No lower extremity edema Gastrointestinal: Nontender, no masses Musculoskeletal:  No clubbing/cyanosis of digits  Neurological: No cranial nerve deficit on limited exam Psychiatric: Normal judgment/insight Anxious mood and affect       Scheduled Medications:   aspirin EC  81 mg Oral Daily   atorvastatin  80 mg Oral QHS   clopidogrel  75 mg Oral Daily   dextromethorphan-guaiFENesin  1 tablet Oral BID   enoxaparin (LOVENOX) injection  40 mg Subcutaneous Q24H   feeding supplement  237 mL Oral TID BM   insulin aspart  0-15 Units Subcutaneous TID AC & HS   isosorbide mononitrate  60 mg Oral Daily   [START ON 01/23/2022] lisinopril  10 mg Oral Daily   metoprolol  succinate  25 mg Oral Daily   multivitamin with minerals  1 tablet Oral Daily   pantoprazole  40 mg Oral Daily    Continuous Infusions:  sodium chloride Stopped (01/15/22 0502)    PRN Medications:  acetaminophen, ALPRAZolam, docusate sodium, ipratropium-albuterol, loperamide, morphine injection, nitroGLYCERIN, ondansetron (ZOFRAN) IV, mouth rinse, polyethylene glycol  Antimicrobials:  Anti-infectives (From admission, onward)    Start     Dose/Rate Route Frequency Ordered Stop   01/11/22 1200  piperacillin-tazobactam (ZOSYN) IVPB 3.375 g        3.375 g 12.5 mL/hr over 240 Minutes Intravenous Every 8 hours 01/11/22 1106 01/16/22 0149   01/11/22 0915  cefTRIAXone (ROCEPHIN) 1 g in sodium chloride 0.9 % 100 mL IVPB  Status:  Discontinued        1 g 200 mL/hr over 30 Minutes Intravenous Every 24 hours 01/11/22 0903 01/11/22 1106       Data Reviewed: I have personally reviewed following labs and imaging studies  CBC: Recent Labs  Lab 01/17/22 1153 01/18/22 0419 01/20/22 0451 01/22/22 0612  WBC 13.2* 11.7* 11.4* 8.9  NEUTROABS  --  8.3*  --   --   HGB 8.1* 8.2* 9.0* 8.9*  HCT 26.3* 26.4* 28.1* 29.2*  MCV 100.8* 96.7 96.2 99.0  PLT 361 351 465* [id number]   Basic Metabolic Panel: Recent Labs  Lab 01/17/22 0330 01/18/22 0419 01/19/22 0511 01/20/22 0451 01/22/22 0612  NA 139 135 135 135 137  K 3.7 4.2 3.4* 3.6 4.4  CL 103 99 98 96* 97*  CO2 27 24 30 30 29   GLUCOSE 98 100* 105* 172* 98  BUN 49* 52* 49* 40* 31*  CREATININE 1.20* 1.06* 1.00 1.07* 0.74  CALCIUM 8.5* 8.5* 8.3* 8.9 8.6*   GFR: Estimated Creatinine Clearance: 43.2 mL/min (by C-G formula based on SCr of 0.74 mg/dL). Liver Function Tests: Recent Labs  Lab 01/18/22 0419  AST 21  ALT 16  ALKPHOS 56  BILITOT 0.8  PROT 6.1*  ALBUMIN 2.9*   No results for input(s): "LIPASE", "AMYLASE" in the last 168 hours. No results for input(s): "AMMONIA" in the last 168 hours. Coagulation Profile: No results for  input(s): "INR", "PROTIME" in the last 168 hours.  Cardiac Enzymes: No results for input(s): "CKTOTAL", "CKMB", "CKMBINDEX", "TROPONINI" in the last 168 hours. BNP (last 3 results) No results for input(s): "PROBNP" in the last 8760 hours. HbA1C: No results for input(s): "HGBA1C" in the last 72 hours. CBG: Recent Labs  Lab 01/21/22 1138 01/21/22 1622 01/21/22 2037 01/22/22 0818 01/22/22 1258  GLUCAP 116* 124* 75 101* 131*   Lipid Profile: No results for input(s): "CHOL", "HDL", "LDLCALC", "TRIG", "CHOLHDL", "LDLDIRECT" in the last 72 hours.  Thyroid Function Tests: No results for input(s): "TSH", "T4TOTAL", "FREET4", "T3FREE", "THYROIDAB" in the last 72 hours. Anemia Panel: No results for input(s): "VITAMINB12", "  FOLATE", "FERRITIN", "TIBC", "IRON", "RETICCTPCT" in the last 72 hours. Urine analysis:    Component Value Date/Time   COLORURINE STRAW (A) 01/17/2022 1832   APPEARANCEUR CLEAR (A) 01/17/2022 1832   LABSPEC 1.006 01/17/2022 1832   PHURINE 5.0 01/17/2022 1832   GLUCOSEU NEGATIVE 01/17/2022 1832   HGBUR NEGATIVE 01/17/2022 1832   BILIRUBINUR NEGATIVE 01/17/2022 1832   KETONESUR NEGATIVE 01/17/2022 1832   PROTEINUR NEGATIVE 01/17/2022 1832   NITRITE NEGATIVE 01/17/2022 1832   LEUKOCYTESUR NEGATIVE 01/17/2022 1832   Sepsis Labs: @LABRCNTIP (procalcitonin:4,lacticidven:4)  Recent Results (from the past 240 hour(s))  Culture, blood (Routine X 2) w Reflex to ID Panel     Status: None   Collection Time: 01/17/22  6:00 PM   Specimen: BLOOD  Result Value Ref Range Status   Specimen Description BLOOD RIGHT ANTECUBITAL  Final   Special Requests   Final    BOTTLES DRAWN AEROBIC AND ANAEROBIC Blood Culture adequate volume   Culture   Final    NO GROWTH 5 DAYS Performed at [facility], [address] [place], [place]    Report Status 01/22/2022 FINAL  Final  Culture, blood (Routine X 2) w Reflex to ID Panel     Status: None   Collection Time:  01/17/22  6:00 PM   Specimen: BLOOD  Result Value Ref Range Status   Specimen Description BLOOD BLOOD LEFT WRIST  Final   Special Requests   Final    BOTTLES DRAWN AEROBIC AND ANAEROBIC Blood Culture adequate volume   Culture   Final    NO GROWTH 5 DAYS Performed at [facility], [address], [place], [place]    Report Status 01/22/2022 FINAL  Final  Remove urinary catheter to obtain Clean Catch urine culture     Status: None   Collection Time: 01/17/22  6:32 PM   Specimen: Urine, Clean Catch  Result Value Ref Range Status   Specimen Description   Final    URINE, CLEAN CATCH Performed at [facility], [address] [place], [place]    Special Requests   Final    NONE Performed at [facility], [address] [place], [place]    Culture   Final    NO GROWTH Performed at [facility], [address] [place], [place]    Report Status 01/18/2022 FINAL  Final  Respiratory (~20 pathogens) panel by PCR     Status: None   Collection Time: 01/17/22  6:32 PM   Specimen: Nasopharyngeal Swab; Respiratory  Result Value Ref Range Status   Adenovirus NOT DETECTED NOT DETECTED Final   Coronavirus 229E NOT DETECTED NOT DETECTED Final    Comment: (NOTE) The Coronavirus on the Respiratory Panel, DOES NOT test for the novel  Coronavirus (2019 nCoV)    Coronavirus HKU1 NOT DETECTED NOT DETECTED Final   Coronavirus NL63 NOT DETECTED NOT DETECTED Final   Coronavirus OC43 NOT DETECTED NOT DETECTED Final   Metapneumovirus NOT DETECTED NOT DETECTED Final   Rhinovirus / Enterovirus NOT DETECTED NOT DETECTED Final   Influenza A NOT DETECTED NOT DETECTED Final   Influenza B NOT DETECTED NOT DETECTED Final   Parainfluenza Virus 1 NOT DETECTED NOT DETECTED Final   Parainfluenza Virus 2 NOT DETECTED NOT DETECTED Final   Parainfluenza Virus 3 NOT DETECTED NOT DETECTED Final   Parainfluenza Virus 4 NOT DETECTED NOT  DETECTED Final   Respiratory Syncytial Virus NOT DETECTED NOT DETECTED Final  Bordetella pertussis NOT DETECTED NOT DETECTED Final   Bordetella Parapertussis NOT DETECTED NOT DETECTED Final   Chlamydophila pneumoniae NOT DETECTED NOT DETECTED Final   Mycoplasma pneumoniae NOT DETECTED NOT DETECTED Final    Comment: Performed at [facility], [place] [address] [place], [place] [address]         Radiology Studies last 96 hours: DG Chest Port 1 View  Result Date: 01/22/2022 CLINICAL DATA:  Shortness of breath EXAM: PORTABLE CHEST 1 VIEW COMPARISON:  Chest x-ray dated January 17, 2022 FINDINGS: Cardiac and mediastinal contours are unchanged, status post CABG. Increased left-greater-than-right heterogeneous pulmonary opacities. Moderate left and small right pleural effusions. Evidence of pneumothorax. IMPRESSION: 1. Increased left-greater-than-right heterogeneous pulmonary opacities, likely due to worsened pulmonary edema. 2. Moderate left and small right pleural effusions, similar in size when compared with prior exam. Electronically Signed   By: Yetta Glassman M.D.   On: 01/22/2022 12:14            LOS: 11 days       Emeterio Reeve, DO [facility] 01/22/2022, 5:05 PM   Staff may message me via secure chat in [place]  but this may not receive immediate response,  please page for urgent matters!  If 7PM-7AM, please contact night-coverage [email or web address]  Dictation software was used to generate the above note. Typos may occur and escape review, as with typed/written notes. Please contact Dr Sheppard Coil directly for clarity if needed.

## 2022-01-22 NOTE — Progress Notes (Signed)
[facility]  Patient ID: Lindsey Pollard MRN: [id number] DOB/AGE: [birth year] 81 y.o.   Admit date: 01/11/2022 Referring Physician Dr. Willy Eddy Primary Physician Dr. Einar Crow  Primary Cardiologist Dr. Juliann Pares Reason for Consultation NSTEMI   HPI: Lindsey Pollard is an 81yoF with a PMH of CAD s/p CABG x2 in 2018 (patent LIMA to LAD,  occluded SVG to OM1 by [facility] 05/2021), h/o PCI x1 distal RCA (50% ISR by [facility] 05/2021), ischemic CM with LVEF with moderate hypo of LV mid apical anterolateral wall, 55-60%, g1dd, mod MR 12/2021, CKD 3, hyperlipidemia, type 2 diabetes, CAD, GERD who was recently hospitalized at [facility] from 7/12 - 7/18 initially for shortness of breath and developed chest pain, VF arrest with successful resuscitation and her NSTEMI was treated medically.  She presents to [facility] ED 2 days after discharge with acute encephalopathy, nausea, vomiting, and was intubated for airway protection in the ED. cardiology was consulted due to her significant cardiac history and concern for NSTEMI. Successfully extubated 7/26 to 3L    Interval History:  -somnolent this morning after getting xanax (anxiety) and morphine (PRN for increased WOB) -feels like she can't breathe when she tries to eat, so she didn't try to eat dinner last night or lunch today - no chest pain   Vitals:   01/22/22 0500 01/22/22 0705 01/22/22 0812 01/22/22 0826  BP:  (!) 161/71  (!) 159/64  Pulse:  88  81  Resp:   14 17  Temp:   97.6 F (36.4 C) 97.6 F (36.4 C)  TempSrc:   Axillary Oral  SpO2:  99%  100%  Weight: 57.1 kg     Height:         Intake/Output Summary (Last 24 hours) at 01/22/2022 1129 Last data filed at 01/22/2022 1006 Gross per 24 hour  Intake 300 ml  Output 200 ml  Net 100 ml     PHYSICAL EXAM General: Elderly and ill-appearing Caucasian female sitting upright in hospital bed with eyes closed. Responds to voice and light touch. Daughter at bedside. HEENT:  Normocephalic and  atraumatic. Neck:   No JVD.  Lungs: Short of breath appearing on 3L by  with bibasilar crackles Heart: HRRR . Normal S1 and S2 with 3/6 systolic murmur heard throughout.  Abdomen: Non-distended appearing.  Msk: Normal strength and tone for age. Extremities: Warm without clubbing, cyanosis.  No peripheral edema.  Neuro: sleeping on my entry to the room, arouses to voice and light touch Psych: somnolent, answers questions appropriately   LABS: Basic Metabolic Panel: Recent Labs    01/20/22 0451 01/22/22 0612  NA 135 137  K 3.6 4.4  CL 96* 97*  CO2 30 29  GLUCOSE 172* 98  BUN 40* 31*  CREATININE 1.07* 0.74  CALCIUM 8.9 8.6*    Liver Function Tests: No results for input(s): "AST", "ALT", "ALKPHOS", "BILITOT", "PROT", "ALBUMIN" in the last 72 hours. No results for input(s): "LIPASE", "AMYLASE" in the last 72 hours. CBC: Recent Labs    01/20/22 0451 01/22/22 0612  WBC 11.4* 8.9  HGB 9.0* 8.9*  HCT 28.1* 29.2*  MCV 96.2 99.0  PLT 465* 343    Cardiac Enzymes: No results for input(s): "CKTOTAL", "CKMB", "CKMBINDEX", "TROPONINI" in the last 72 hours. BNP: Invalid input(s): "POCBNP" D-Dimer: No results for input(s): "DDIMER" in the last 72 hours. Hemoglobin A1C: No results for input(s): "HGBA1C" in the last 72 hours. Fasting Lipid Panel: No results for input(s): "CHOL", "HDL", "LDLCALC", "TRIG", "CHOLHDL", "LDLDIRECT" in  the last 72 hours. Thyroid Function Tests: No results for input(s): "TSH", "T4TOTAL", "T3FREE", "THYROIDAB" in the last 72 hours.  Invalid input(s): "FREET3" Anemia Panel: No results for input(s): "VITAMINB12", "FOLATE", "FERRITIN", "TIBC", "IRON", "RETICCTPCT" in the last 72 hours.  No results found.   Echo LVEF 45-50% 01/12/2022  TELEMETRY:  ASSESSMENT AND PLAN:  Principal Problem:   NSTEMI (non-ST elevated myocardial infarction) (HCC) Active Problems:   S/P CABG x 2   AKI (acute kidney injury) (HCC) in the setting of stage IIIa  chronic   Acute respiratory failure with hypoxia (HCC)   Unstable angina (HCC)   Malnutrition of moderate degree   Fever and chills   Goals of care, counseling/discussion    1. Acute hypoxic respiratory failure, multifactorial, extubated 01/16/2022, continues to have episodic shortness of breath, oxygen saturation 100% on 3 L, net 24-hour output  plus 270 cc 2.  HFpEF, EF 45-50% 01/12/2022, appears euvolemic 3.  NSTEMI 01/04/2022, known occluded SVG to OM1 with patent LIMA to LAD by cardiac catheterization 06/01/2021, not felt to be amenable to percutaneous or surgical PCI 4.  AKI with underlying CKD, BUN and creatinine 49 and 1.00, respectively   Recommendations   1.  Agree with overall current therapy 2.  increase isosorbide mononitrate from 30mg  to 60 mg daily 3.  Increase metoprolol XL to 25mg  daily 4.  will repeat a CXR today and give another 40mg  of IV lasix today - likely discharge on torsemide 10mg  daily  5.  Defer further cardiac diagnostics at this time 6.  Pending SNF placement   This patient's plan of care was discussed and created with Dr. and he is in agreement.    , PA-C 01/22/2022 11:29 AM

## 2022-01-22 NOTE — Plan of Care (Signed)
  Problem: Education: Goal: Knowledge of General Education information will improve Description: Including pain rating scale, medication(s)/side effects and non-pharmacologic comfort measures Outcome: Progressing   Problem: Health Behavior/Discharge Planning: Goal: Ability to manage health-related needs will improve Outcome: Progressing   Problem: Clinical Measurements: Goal: Ability to maintain clinical measurements within normal limits will improve Outcome: Progressing Goal: Will remain free from infection Outcome: Progressing Goal: Diagnostic test results will improve Outcome: Progressing Goal: Respiratory complications will improve Outcome: Progressing Goal: Cardiovascular complication will be avoided Outcome: Progressing   Problem: Activity: Goal: Risk for activity intolerance will decrease Outcome: Progressing   Problem: Nutrition: Goal: Adequate nutrition will be maintained Outcome: Progressing   Problem: Coping: Goal: Level of anxiety will decrease Outcome: Progressing   Problem: Elimination: Goal: Will not experience complications related to bowel motility Outcome: Progressing Goal: Will not experience complications related to urinary retention Outcome: Progressing   Problem: Pain Managment: Goal: General experience of comfort will improve Outcome: Progressing   Problem: Safety: Goal: Ability to remain free from injury will improve Outcome: Progressing   Problem: Skin Integrity: Goal: Risk for impaired skin integrity will decrease Outcome: Progressing   Problem: Education: Goal: Ability to describe self-care measures that may prevent or decrease complications (Diabetes Survival Skills Education) will improve Outcome: Progressing   Problem: Coping: Goal: Ability to adjust to condition or change in health will improve Outcome: Progressing   Problem: Fluid Volume: Goal: Ability to maintain a balanced intake and output will improve Outcome:  Progressing   Problem: Health Behavior/Discharge Planning: Goal: Ability to identify and utilize available resources and services will improve Outcome: Progressing Goal: Ability to manage health-related needs will improve Outcome: Progressing   Problem: Metabolic: Goal: Ability to maintain appropriate glucose levels will improve Outcome: Progressing   Problem: Nutritional: Goal: Maintenance of adequate nutrition will improve Outcome: Progressing Goal: Progress toward achieving an optimal weight will improve Outcome: Progressing   Problem: Skin Integrity: Goal: Risk for impaired skin integrity will decrease Outcome: Progressing   Problem: Tissue Perfusion: Goal: Adequacy of tissue perfusion will improve Outcome: Progressing   Problem: Activity: Goal: Ability to tolerate increased activity will improve Outcome: Progressing   Problem: Respiratory: Goal: Ability to maintain a clear airway and adequate ventilation will improve Outcome: Progressing   Problem: Role Relationship: Goal: Method of communication will improve Outcome: Progressing   

## 2022-01-23 DIAGNOSIS — I214 Non-ST elevation (NSTEMI) myocardial infarction: Secondary | ICD-10-CM | POA: Diagnosis not present

## 2022-01-23 LAB — GLUCOSE, CAPILLARY
Glucose-Capillary: 115 mg/dL — ABNORMAL HIGH (ref 70–99)
Glucose-Capillary: 139 mg/dL — ABNORMAL HIGH (ref 70–99)

## 2022-01-23 MED ORDER — FUROSEMIDE 10 MG/ML IJ SOLN
40.0000 mg | Freq: Once | INTRAMUSCULAR | Status: AC
  Administered 2022-01-23: 40 mg via INTRAVENOUS
  Filled 2022-01-23: qty 4

## 2022-01-23 MED ORDER — ALPRAZOLAM 0.25 MG PO TABS
0.1250 mg | ORAL_TABLET | Freq: Three times a day (TID) | ORAL | 0 refills | Status: DC | PRN
Start: 2022-01-23 — End: 2022-02-02

## 2022-01-23 MED ORDER — DOCUSATE SODIUM 100 MG PO CAPS: 100.0000 mg | ORAL_CAPSULE | Freq: Two times a day (BID) | ORAL | 0 refills | Status: AC | PRN

## 2022-01-23 MED ORDER — PANTOPRAZOLE SODIUM 40 MG PO TBEC
40.0000 mg | DELAYED_RELEASE_TABLET | Freq: Every day | ORAL | 0 refills | Status: AC
Start: 2022-01-24 — End: ?

## 2022-01-23 MED ORDER — MORPHINE SULFATE 10 MG/5ML PO SOLN: 5.0000 mg | ORAL | 0 refills | Status: DC | PRN

## 2022-01-23 MED ORDER — FUROSEMIDE 10 MG/ML IJ SOLN
40.0000 mg | Freq: Two times a day (BID) | INTRAMUSCULAR | Status: DC
Start: 2022-01-23 — End: 2022-01-23

## 2022-01-23 MED ORDER — METOPROLOL SUCCINATE ER 25 MG PO TB24
25.0000 mg | ORAL_TABLET | Freq: Every day | ORAL | 0 refills | Status: AC
Start: 2022-01-24 — End: ?

## 2022-01-23 MED ORDER — POTASSIUM CHLORIDE 20 MEQ PO PACK
20.0000 meq | PACK | Freq: Once | ORAL | Status: AC
  Administered 2022-01-23: 20 meq via ORAL
  Filled 2022-01-23: qty 1

## 2022-01-23 MED ORDER — SPIRONOLACTONE 25 MG PO TABS
12.5000 mg | ORAL_TABLET | Freq: Every day | ORAL | Status: DC
Start: 2022-01-24 — End: 2022-01-23

## 2022-01-23 MED ORDER — NITROGLYCERIN 2 % TD OINT: 0.5000 [in_us] | TOPICAL_OINTMENT | Freq: Four times a day (QID) | TRANSDERMAL | 0 refills | Status: DC | PRN

## 2022-01-23 MED ORDER — ISOSORBIDE MONONITRATE ER 60 MG PO TB24: 60.0000 mg | ORAL_TABLET | Freq: Every day | ORAL | 0 refills | Status: DC

## 2022-01-23 MED ORDER — TORSEMIDE 10 MG PO TABS: ORAL_TABLET | ORAL | 0 refills | Status: DC

## 2022-01-23 MED ORDER — LOPERAMIDE HCL 2 MG PO CAPS: 2.0000 mg | ORAL_CAPSULE | ORAL | 0 refills | Status: AC | PRN

## 2022-01-23 MED ORDER — ACETAMINOPHEN 325 MG PO TABS: 650.0000 mg | ORAL_TABLET | ORAL | Status: AC | PRN

## 2022-01-23 MED ORDER — POLYETHYLENE GLYCOL 3350 17 G PO PACK: 17.0000 g | PACK | Freq: Every day | ORAL | 0 refills | Status: AC | PRN

## 2022-01-23 MED ORDER — MORPHINE SULFATE 10 MG/5ML PO SOLN: 5.0000 mg | ORAL | Status: DC | PRN

## 2022-01-23 MED ORDER — TORSEMIDE 20 MG PO TABS: 10.0000 mg | ORAL_TABLET | Freq: Every day | ORAL | Status: DC

## 2022-01-23 MED ORDER — LISINOPRIL 10 MG PO TABS: 10.0000 mg | ORAL_TABLET | Freq: Every day | ORAL | 0 refills | Status: DC

## 2022-01-23 NOTE — Progress Notes (Signed)
Mobility Specialist - Progress Note   01/23/22 1200  Mobility  Activity Ambulated with assistance in room;Transferred from bed to chair  Level of Assistance +2 (takes two people) (safety during transfer  Simultaneous filing. User may not have seen previous data.)  Assistive Device Front wheel walker  Distance Ambulated (ft) 5 ft  Activity Response Tolerated well (Simultaneous filing. User may not have seen previous data.)  $Mobility charge 1 Mobility (Simultaneous filing. User may not have seen previous data.)     Pre-mobility: 69 HR, 100% SpO2 During mobility: 71 HR, 95%  SpO2   Pt sleeping in bed upon arrival, utilizing 3L. Pt sat EOB with minA voicing lightheadedness upon sitting. Pt stood to RW with CGA and ambulated to recliner with minA. Cueing to initiate gait, does have mild fear of falling with activity. Assist to navigate with RW. Pt left in chair with alarm set, needs in reach.    Filiberto Pinks Mobility Specialist 01/23/22, 12:31 PM

## 2022-01-23 NOTE — Progress Notes (Signed)
Occupational Therapy Treatment Patient Details Name: Lindsey Pollard MRN: [id number] DOB: February 06, 1941 Today's Date: 01/23/2022   History of present illness 81 year old female who presents to [facility] ED on 01/11/2022 due to altered mental status, respiratory distress, and nausea/vomiting. Pt intubated 7/20-25, She was recently admitted 7/12 - 18 for NSTEMI and acute CHF, along with acute kidney injury, discharged home with PT/OT and cardiac rehab but continued to be weakwith a past medical history significant for recent V-fib arrest, HFpEF, ischemic cardiomyopathy, CAD status post CABG x2 in 2018 and PCI, chronic kidney disease stage III, diabetes mellitus.   OT comments  Pt seen for OT treatment on this date. Upon arrival to room pt awake and alert, lying in bed with HOB elevated and family present. Pt agreeable to tx focused on dressing, toileting, and toilet t/f. Pt requiring MIN A/HHA with increased time/effort for supine>sit. MIN A with min vcs for safe hand placement for STS. MAX A for donning/doffing pants and underpants sit/stand. SBA for donning/doffing shirt while seated. MIN A for grooming tasks while seated - needs assist combing hair on the back of head. MIN A + RW with min vcs for sequencing and RW management for BSC t/f. MIN A for balance for pericare in standing with single UE support on RW. Pt required several seated rest breaks throughout session and only able to tolerate ~30 seconds of standing. Pt oxygen 84% on room air while seated, resolved to 96% with 3L via [place] and PLB. Pt left seated at EOB eating lunch with family present. Pt making good progress toward goals. Pt continues to benefit from skilled OT services to maximize return to PLOF and minimize risk of future falls, injury, caregiver burden, and readmission. Will continue to follow POC. Discharge recommendation remains appropriate.      Recommendations for follow up therapy are one component of a multi-disciplinary discharge planning  process, led by the attending physician.  Recommendations may be updated based on patient status, additional functional criteria and insurance authorization.    Follow Up Recommendations  Skilled nursing-short term rehab (<3 hours/day)    Assistance Recommended at Discharge Intermittent Supervision/Assistance  Patient can return home with the following  A little help with walking and/or transfers;A little help with bathing/dressing/bathroom;Help with stairs or ramp for entrance   Equipment Recommendations  Other (comment) (per next venue of care)    Recommendations for Other Services      Precautions / Restrictions Precautions Precautions: Fall Restrictions Weight Bearing Restrictions: No       Mobility Bed Mobility Overal bed mobility: Needs Assistance Bed Mobility: Supine to Sit     Supine to sit: Min assist, HOB elevated (HHA)          Transfers Overall transfer level: Needs assistance Equipment used: Rolling walker (2 wheels) Transfers: Sit to/from Stand, Bed to chair/wheelchair/BSC Sit to Stand: Min assist     Step pivot transfers: Min assist           Balance Overall balance assessment: Needs assistance Sitting-balance support: Feet supported, No upper extremity supported Sitting balance-Leahy Scale: Good     Standing balance support: Single extremity supported, During functional activity, Reliant on assistive device for balance Standing balance-Leahy Scale: Fair                             ADL either performed or assessed with clinical judgement   ADL Overall ADL's : Needs assistance/impaired  General ADL Comments: MAX A for donning/doffing pants and underpants sit/stand. SBA for donning/doffing shirt while seated. MIN A for grooming tasks while seated - needs assist combing hair on the back of head. MIN A + RW with min vcs for sequencing and RW management for BSC t/f. MIN A for  balance for pericare in standing with single UE support on RW.      Cognition Arousal/Alertness: Awake/alert Behavior During Therapy: WFL for tasks assessed/performed, Flat affect Overall Cognitive Status: Within Functional Limits for tasks assessed                                                     Pertinent Vitals/ Pain       Pain Assessment Pain Assessment: Faces Faces Pain Scale: No hurt   Frequency  Min 2X/week        Progress Toward Goals  OT Goals(current goals can now be found in the care plan section)  Progress towards OT goals: Progressing toward goals  Acute Rehab OT Goals Patient Stated Goal: to get stronger OT Goal Formulation: With patient/family Time For Goal Achievement: 02/01/22 Potential to Achieve Goals: Good ADL Goals Pt Will Perform Grooming: with modified independence;sitting Pt Will Perform Lower Body Dressing: with min assist;sit to/from stand Pt Will Transfer to Toilet: with min assist;ambulating;bedside commode  Plan Frequency remains appropriate;Discharge plan remains appropriate       AM-PAC OT "6 Clicks" Daily Activity     Outcome Measure   Help from another person eating meals?: None Help from another person taking care of personal grooming?: A Little Help from another person toileting, which includes using toliet, bedpan, or urinal?: A Lot Help from another person bathing (including washing, rinsing, drying)?: A Lot Help from another person to put on and taking off regular upper body clothing?: A Little Help from another person to put on and taking off regular lower body clothing?: A Lot 6 Click Score: 16    End of Session Equipment Utilized During Treatment: Rolling walker (2 wheels);Gait belt  OT Visit Diagnosis: Unsteadiness on feet (R26.81);Muscle weakness (generalized) (M62.81)   Activity Tolerance Patient tolerated treatment well   Patient Left in bed;with call bell/phone within reach;with  family/visitor present   Nurse Communication          Time: 2952-8413 OT Time Calculation (min): 31 min  Charges: OT General Charges $OT Visit: 1 Visit OT Treatments $Self Care/Home Management : 23-37 mins  [employer], OTDS  [employer] 01/23/2022, 3:59 PM

## 2022-01-23 NOTE — Progress Notes (Signed)
Discharge report given to [place] at [facility]. Family updated, currently at bedside and taking patient belongings to facility.  Awaiting EMS transport.

## 2022-01-23 NOTE — TOC Progression Note (Signed)
Transition of Care [facility]) - Progression Note    Patient Details  Name: Lindsey Pollard MRN: [id number] Date of Birth: 02/08/1941  [facility]) CM/SW Contact  Chapman Fitch, RN Phone Number: 01/23/2022, 11:09 AM  Clinical Narrative:     Received return call from nephew Riley Lam. He has accepted bed at [facility] place. Accepted bed in HUB and notified admission coordinator Auth started in [place] portal [place] Sam id [id number]  Expected Discharge Plan: Skilled Nursing Facility Barriers to Discharge: Continued Medical Work up  Expected Discharge Plan and Services Expected Discharge Plan: Skilled Nursing Facility   Discharge Planning Services: CM Consult Post Acute Care Choice: Skilled Nursing Facility Living arrangements for the past 2 months: Single Family Home                           HH Arranged: NA           Social Determinants of Health (SDOH) Interventions    Readmission Risk Interventions    01/18/2022    9:04 PM 01/05/2022    5:09 PM  Readmission Risk Prevention Plan  Transportation Screening Complete Complete  PCP or Specialist Appt within 3-5 Days  Complete  Social Work Consult for Recovery Care Planning/Counseling  Not Complete  SW consult not completed comments  NA  Palliative Care Screening  Not Applicable  Medication Review Oceanographer) Referral to Pharmacy Referral to Pharmacy  PCP or Specialist appointment within 3-5 days of discharge Complete   HRI or Home Care Consult Complete   SW Recovery Care/Counseling Consult Not Complete   SW Consult Not Complete Comments NA   Palliative Care Screening Not Applicable   Skilled Nursing Facility Complete

## 2022-01-23 NOTE — Progress Notes (Signed)
Daily Progress Note   Patient Name: Lindsey Pollard       Date: 01/23/2022 DOB: Feb 07, 1941  Age: 81 y.o. MRN#: [id number] Attending Physician: Emeterio Reeve, DO Primary Care Physician: [name], MD Admit Date: 01/11/2022  Reason for Consultation/Follow-up: Establishing goals of care  Patient Profile/HPI:  81 y.o. female  with past medical history of V-fib arrest, NSTEMI, ischemic cardiomyopathy, CAD, CKD III, DM  admitted on 01/11/2022 with altered mental status, respiratory distress, vomiting. She was intubated for airway protection- has been extubated. Workup this hospitalization reveals NSTEMI and acute kidney injury. She has expressed her wishes not to come back to the hospital. Palliative medicine consulted for [place].   Subjective: Chart reviewed including labs, progress notes, medication use. Plan for discharge today.  Her daughter, friend and HCPOA are at bedside.  I reviewed the plan of care for patient agreed upon yesterday- d/c to SNF with outpatient Palliative- with hopes of increasing endurance so she can tolerate trip to [place] where she can hopefully establish with hospice. If she declines while at SNF, she would not want to return to the hospital- would want comfort measures only and Hospice care.   I reviewed and completed  MOST form with her and her HCPOA-   Cardiopulmonary Resuscitation: Do Not Attempt Resuscitation (DNR/No CPR)  Medical Interventions: Comfort Measures: Keep clean, warm, and dry. Use medication by any route, positioning, wound care, and other measures to relieve pain and suffering. Use oxygen, suction and manual treatment of airway obstruction as needed for comfort. Do not transfer to the hospital unless comfort needs cannot be met in current  location.  Antibiotics: Determine use of limitation of antibiotics when infection occurs  IV Fluids: IV fluids for a defined trial period  Feeding Tube: No feeding tube    Review of Systems  Constitutional:  Positive for malaise/fatigue.     Physical Exam Vitals and nursing note reviewed.  Neurological:     Mental Status: She is alert and oriented to person, place, and time.     Comments: Hard of hearing  Psychiatric:        Mood and Affect: Mood normal.        Thought Content: Thought content normal.        Judgment: Judgment normal.  Vital Signs: BP (!) 146/58 (BP Location: Left Arm)   Pulse 78   Temp 97.6 F (36.4 C)   Resp (!) 21   Ht 4' 11.02" (1.499 m)   Wt 57.1 kg   SpO2 100%   BMI 25.39 kg/m  SpO2: SpO2: 100 % O2 Device: O2 Device: Nasal Cannula O2 Flow Rate: O2 Flow Rate (L/min): 3 L/min  Intake/output summary:  Intake/Output Summary (Last 24 hours) at 01/23/2022 1424 Last data filed at 01/23/2022 0500 Gross per 24 hour  Intake 120 ml  Output 900 ml  Net -780 ml   LBM: Last BM Date : 01/21/22 Baseline Weight: Weight: 63.7 kg Most recent weight: Weight: 57.1 kg       Palliative Assessment/Data: PPS: 40%      Patient Active Problem List   Diagnosis Date Noted   Goals of care, counseling/discussion 01/20/2022   Fever and chills 01/17/2022   Malnutrition of moderate degree 01/11/2022   Overweight (BMI 25.0-29.9) 01/06/2022   Cardiac arrest with successful resuscitation ([place]) 01/03/2022   Unstable angina ([place]) 12/07/2021   NSTEMI (non-ST elevated myocardial infarction) ([place]) 12/06/2021   Chronic kidney disease, stage 3a ([place]) 12/06/2021   Lung nodule 12/06/2021   Type II diabetes mellitus with renal manifestations ([place]) 12/06/2021   HTN (hypertension) 12/06/2021   HLD (hyperlipidemia) 12/06/2021   AKI (acute kidney injury) ([place]) in the setting of stage IIIa chronic    Anemia    Acute respiratory failure with hypoxia (HCC)    Chronic  diastolic CHF (congestive heart failure) (HCC)    Fungal infection of the groin    Fungal infection of skin    Essential hypertension 06/01/2021   Carotid stenosis 10/26/2020   Atherosclerosis of native arteries of extremity with intermittent claudication ([place]) 09/16/2020   Carotid bruit present 09/16/2020   Healthcare maintenance 01/06/2019   Aortic calcification (HCC) 06/19/2017   Ischemic cardiomyopathy 06/19/2017   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/17/2017   PVD (peripheral vascular disease) ([place]) 06/14/2017   Pseudophakia of right eye 11/10/2014   Bilateral hearing loss 10/05/2014   CAD (coronary artery disease) 10/05/2014   GERD (gastroesophageal reflux disease) 10/05/2014   RLS (restless legs syndrome) 09/09/2014   Mixed hyperlipidemia 08/27/2014   Type 2 diabetes mellitus with hyperlipidemia ([place]) 08/27/2014    Palliative Care Assessment & Plan    Assessment/Recommendations/Plan  D/C to SNF TOC order to refer to outpatient Palliative MOST form completed and scanned to [facility] and placed in paper chart DNR form signed, scanned to [facility] and placed in paper chart HCPOA form scanned to [facility] and placed on paper chart   Code Status: DNR  Prognosis:  Unable to determine  Discharge Planning: [place] for rehab with Palliative care service follow-up  Care plan was discussed with family and care team.   Thank you for allowing the Palliative Medicine Team to assist in the care of this patient.  Total time:  60 mins Prolonged billing:      Greater than 50%  of this time was spent counseling and coordinating care related to the above assessment and plan.  Mariana Kaufman, AGNP-C Palliative Medicine   Please contact Palliative Medicine Team phone at [phone] for questions and concerns.

## 2022-01-23 NOTE — Progress Notes (Signed)
[facility] Cardiology  Patient ID: Lindsey Pollard MRN: [id number] DOB/AGE: [birth year] 81 y.o.   Admit date: 01/11/2022 Referring Physician Dr. Willy Eddy Primary Physician Dr. Einar Crow  Primary Cardiologist Dr. Juliann Pares Reason for Consultation NSTEMI   HPI: Lindsey Pollard is an 81yoF with a PMH of CAD s/p CABG x2 in 2018 (patent LIMA to LAD,  occluded SVG to OM1 by [facility] 05/2021), h/o PCI x1 distal RCA (50% ISR by [facility] 05/2021), ischemic CM with LVEF with moderate hypo of LV mid apical anterolateral wall, 55-60%, g1dd, mod MR 12/2021, CKD 3, hyperlipidemia, type 2 diabetes, CAD, GERD who was recently hospitalized at [facility] from 7/12 - 7/18 initially for shortness of breath and developed chest pain, VF arrest with successful resuscitation and her NSTEMI was treated medically.  She presents to [facility] ED 2 days after discharge with acute encephalopathy, nausea, vomiting, and was intubated for airway protection in the ED. cardiology was consulted due to her significant cardiac history and concern for NSTEMI. Successfully extubated 7/26 to 3L    Interval History:  -slept well last night, feels like she is breathing a little easier -eager to go live with her daughter -no chest pain    Vitals:   01/22/22 1715 01/22/22 2002 01/23/22 0416 01/23/22 0827  BP: (!) 126/48 122/62 (!) 146/56 (!) 146/58  Pulse: 75 79 75 78  Resp: 15 17 18  (!) 21  Temp: 97.7 F (36.5 C) 97.8 F (36.6 C)  97.6 F (36.4 C)  TempSrc:      SpO2: 100% 99% 100% 100%  Weight:      Height:         Intake/Output Summary (Last 24 hours) at 01/23/2022 1212 Last data filed at 01/23/2022 0500 Gross per 24 hour  Intake 120 ml  Output 900 ml  Net -780 ml     PHYSICAL EXAM General: Elderly and ill-appearing Caucasian female sitting upright in hospital bed  HEENT:  Normocephalic and atraumatic. Neck:   No JVD.  Lungs: Short of breath appearing on 3L by [place] with bibasilar crackles Heart: HRRR . Normal S1 and S2 with 3/6 systolic  murmur heard throughout.  Abdomen: Non-distended appearing.  Msk: Normal strength and tone for age. Extremities: Warm without clubbing, cyanosis.  No peripheral edema.  Neuro: alert and oriented x 3 Psych:  answers questions appropriately   LABS: Basic Metabolic Panel: Recent Labs    01/22/22 0612  NA 137  K 4.4  CL 97*  CO2 29  GLUCOSE 98  BUN 31*  CREATININE 0.74  CALCIUM 8.6*    Liver Function Tests: No results for input(s): "AST", "ALT", "ALKPHOS", "BILITOT", "PROT", "ALBUMIN" in the last 72 hours. No results for input(s): "LIPASE", "AMYLASE" in the last 72 hours. CBC: Recent Labs    01/22/22 0612  WBC 8.9  HGB 8.9*  HCT 29.2*  MCV 99.0  PLT 343    Cardiac Enzymes: No results for input(s): "CKTOTAL", "CKMB", "CKMBINDEX", "TROPONINI" in the last 72 hours. BNP: Invalid input(s): "POCBNP" D-Dimer: No results for input(s): "DDIMER" in the last 72 hours. Hemoglobin A1C: No results for input(s): "HGBA1C" in the last 72 hours. Fasting Lipid Panel: No results for input(s): "CHOL", "HDL", "LDLCALC", "TRIG", "CHOLHDL", "LDLDIRECT" in the last 72 hours. Thyroid Function Tests: No results for input(s): "TSH", "T4TOTAL", "T3FREE", "THYROIDAB" in the last 72 hours.  Invalid input(s): "FREET3" Anemia Panel: No results for input(s): "VITAMINB12", "FOLATE", "FERRITIN", "TIBC", "IRON", "RETICCTPCT" in the last 72 hours.  DG Chest [facility] 1 [address]  Result Date: 01/22/2022 CLINICAL DATA:  Shortness of breath EXAM: PORTABLE CHEST 1 VIEW COMPARISON:  Chest x-ray dated January 17, 2022 FINDINGS: Cardiac and mediastinal contours are unchanged, status post CABG. Increased left-greater-than-right heterogeneous pulmonary opacities. Moderate left and small right pleural effusions. Evidence of pneumothorax. IMPRESSION: 1. Increased left-greater-than-right heterogeneous pulmonary opacities, likely due to worsened pulmonary edema. 2. Moderate left and small right pleural effusions, similar in  size when compared with prior exam. Electronically Signed   By: Allegra Lai M.D.   On: 01/22/2022 12:14     Echo LVEF 45-50% 01/12/2022  ASSESSMENT AND PLAN:  Principal Problem:   NSTEMI (non-ST elevated myocardial infarction) (HCC) Active Problems:   S/P CABG x 2   AKI (acute kidney injury) (HCC) in the setting of stage IIIa chronic   Acute respiratory failure with hypoxia (HCC)   Unstable angina (HCC)   Malnutrition of moderate degree   Fever and chills   Goals of care, counseling/discussion    1. Acute hypoxic respiratory failure, multifactorial, extubated 01/16/2022, continues to have episodic shortness of breath, oxygen saturation 100% on 3 L, net 24-hour output  plus 270 cc 2.  HFpEF, EF 45-50% 01/12/2022 3.  NSTEMI 01/04/2022, known occluded SVG to OM1 with patent LIMA to LAD by cardiac catheterization 06/01/2021, not felt to be amenable to percutaneous or surgical PCI 4.  AKI with underlying CKD, BUN and creatinine 49 and 1.00, respectively   Recommendations   1.  Agree with overall current therapy 2.  Please discharge on isosorbide mononitrate 60 mg daily, metoprolol XL to 25mg  daily, lisinopril 10mg  daily, spironolactone 12.5mg  daily. If she goes to rehab today, take torsemide 20mg  daily for 3 days, then 10mg  daily therafter. If she is here overnight, will give another 2 doses of IV lasix tonight and this morning.  3.   4.  Defer further cardiac diagnostics at this time 7.  Pending SNF placement   This patient's plan of care was discussed and created with Dr. and he is in agreement.    , PA-C 01/23/2022 12:12 PM

## 2022-01-23 NOTE — Progress Notes (Signed)
EMS transported to facility.  Daughter took patient belongings with her to facility.  Piv x2 removed.  Discharge packet with POA, DNR, MOST and discharge instructions, with RX signed taken by EMS for facility.

## 2022-01-23 NOTE — TOC Transition Note (Signed)
Transition of Care [facility]) - CM/SW Discharge Note   Patient Details  Name: Lindsey Pollard MRN: [id number] Date of Birth: [birth year]  Transition of [facility]) CM/SW Contact:  Chapman Fitch, RN Phone Number: 01/23/2022, 3:02 PM   Clinical Narrative:    Marcelino Duster at [facility] confirms she has auth and can accept patient today Nephew updated Outpatient palliative referral made to [facility] with [employer]    EMS packet printed to floor Bedside RN confirms she has ems packet, signed scripts, and signed DNR Nephew updated  EMS called     Barriers to Discharge: Continued Medical Work up   Patient Goals and CMS Choice Patient states their goals for this hospitalization and ongoing recovery are:: to go for STR then move to live with her daughter CMS Medicare.gov Compare Post Acute Care list provided to:: Patient Choice offered to / list presented to : Patient  Discharge Placement                       Discharge Plan and Services   Discharge Planning Services: CM Consult Post Acute Care Choice: Skilled Nursing Facility                    HH Arranged: NA          Social Determinants of Health (SDOH) Interventions     Readmission Risk Interventions    01/18/2022    9:04 PM 01/05/2022    5:09 PM  Readmission Risk Prevention Plan  Transportation Screening Complete Complete  PCP or Specialist Appt within 3-5 Days  Complete  Social Work Consult for Recovery Care Planning/Counseling  Not Complete  SW consult not completed comments  NA  Palliative Care Screening  Not Applicable  Medication Review Oceanographer) Referral to Pharmacy Referral to Pharmacy  PCP or Specialist appointment within 3-5 days of discharge Complete   HRI or Home Care Consult Complete   SW Recovery Care/Counseling Consult Not Complete   SW Consult Not Complete Comments NA   Palliative Care Screening Not Applicable   Skilled Nursing Facility Complete

## 2022-01-23 NOTE — Progress Notes (Signed)
Nutrition Follow Up Note   DOCUMENTATION CODES:   Non-severe (moderate) malnutrition in context of chronic illness  INTERVENTION:   Ensure Enlive po TID, each supplement provides 350 kcal and 20 grams of protein.  Magic cup TID with meals, each supplement provides 290 kcal and 9 grams of protein  MVI po daily   Pt at high refeed risk; recommend monitor potassium, magnesium and phosphorus labs daily until stable  NUTRITION DIAGNOSIS:   Moderate Malnutrition related to chronic illness as evidenced by moderate muscle depletion, moderate fat depletion.  GOAL:   Patient will meet greater than or equal to 90% of their needs -not met   MONITOR:   PO intake, Supplement acceptance, Labs, Weight trends, Skin, I & O's  ASSESSMENT:   81 y/o female with h/o CAD s/p CABG x 2, PVD, GERD, CKD III, MI, DM, CHF, HTN, HLD and recent admission for VFib cardiac arrest and who is now admitted with acute metabolic encephalopathy, acute respiratory failure in the setting of acute on chronic HFpEF, elevated troponins (NSTEMI versus demand ischemia), UTI and presumed aspiration requiring intubation for airway protection.  Pt continues to have poor appetite and oral intake; pt eating <25% of meals in hospital. Pt is drinking some Ensure and is eating some food brought from outside of the hospital. Palliative care met with pt yesterday; pt does want a feeding tube and wants to focus more on being comfortable with no escalation of care. Plan is for discharge to SNF today.   Medications reviewed and include: aspirin, plavix, lovenox, lasix, insulin, protonix, MVI, aldactone   Labs reviewed: K 4.4 wnl, BUN 31(H) Hgb 8.9(L), Hct 29.2(L) Cbgs- 139, 115 x 24 hrs  Diet Order:   Diet Order             Diet - low sodium heart healthy           Diet regular Room service appropriate? Yes; Fluid consistency: Thin  Diet effective now                  EDUCATION NEEDS:   No education needs have been  identified at this time  Skin:  Skin Assessment: Reviewed RN Assessment (ecchymosis)  Last BM:  8/1- type 4  Height:   Ht Readings from Last 1 Encounters:  01/15/22 4' 11.02" (1.499 m)    Weight:   Wt Readings from Last 1 Encounters:  01/22/22 57.1 kg    Ideal Body Weight:  44.5 kg  BMI:  Body mass index is 25.39 kg/m.  Estimated Nutritional Needs:   Kcal:  1500-1700kcal/day  Protein:  75-85g/day  Fluid:  1.2-1.4L/day  Koleen Distance MS, RD, LDN Please refer to [facility] for RD and/or RD on-call/weekend/after hours pager

## 2022-01-23 NOTE — Discharge Summary (Signed)
Physician Discharge Summary   Patient: Lindsey Pollard MRN: [id number]  DOB: [birth year]   Admit:     Date of Admission: 01/11/2022 Admitted from: home   Discharge: Date of discharge: 01/23/22 Disposition: Skilled nursing facility Condition at discharge: fair  CODE STATUS: DNR   Diet recommendation: Regular diet   Discharge Physician: Sunnie Nielsen, DO [facility]     PCP: Lauro Regulus, MD  Recommendations for Outpatient Follow-up:  Follow up with palliative care outpatient: Goal for symptom control of chronic recurrent chest pain and shortness of breath due to end-stage coronary artery disease, patient does not want to be hospitalized Please obtain labs/tests: BMP, CBC, magnesium in 1-2 weeks Up with cardiology as directed Please follow up on the following pending results: none    Discharge Instructions     (HEART FAILURE PATIENTS) Call MD:  Anytime you have any of the following symptoms: 1) 3 pound weight gain in 24 hours or 5 pounds in 1 week 2) shortness of breath, with or without a dry hacking cough 3) swelling in the hands, feet or stomach 4) if you have to sleep on extra pillows at night in order to breathe.   Complete by: As directed    Amb Referral to Palliative Care   Complete by: As directed    Diet - low sodium heart healthy   Complete by: As directed    Increase activity slowly   Complete by: As directed           Brief/Interim Summary: 81 year old female with a past medical history significant for recent V-fib arrest, HFpEF, ischemic cardiomyopathy, CAD status post CABG x2 in 2018 and PCI, chronic kidney disease stage III, diabetes mellitus who presents to [facility] ED on 01/11/2022 due to altered mental status, respiratory distress, and nausea/vomiting. (Of note: Was recently admitted from 7/12 through 7/18 for brief cardiac arrest in the setting of NSTEMI and acute CHF, along with acute kidney injury, discharged home with PT/OT and  cardiac rehab but continued to be weak, very sleepy (more than normal), with poor p.o. intake.) 07/20: Intubated in ED, cardiology consulted for NSTEMI, admitted to PCCM. 07/21: Multiorgan failure, requiring pressors, poor prognosis 07/22, 07/23: DNR. Failed weaning trials 07/24: failed SAT/SBT d/t ischemic cardiomyopathy  07/25: No further interventions per cardiology. GOC discussion w/ Dr Belia Heman (ICU), remains DNR, agreed to extubation, increased WOB and trial of BiPap but pt did not tolerate this. Plan transfer to [facility] tomorrow.  07/26: TRH pickup from ICU. O2 requirement lower, will transfer out of SDU. PT/OT pending will likely need SNF/rehab. Temp increasing, repeat BCx, CXR, UA (no apparent UTI), resp PCR (negative), remove foley, may need to restart abx. CXR shows increased edema 07/27: lasix dose x2, WBC trending down, afebrile. BCx NG<24h. O2 saturation good on 2L but notable increased WOB on exam.  07/28: good UOP yesterday w/ Lasix. RR still occasionally high. SNF placement per TOC.  Per cardiology, patient has expressed a wish that she will eventually go home with her daughter and "be comfortable" and not have her come back to the hospital.  [facility] working on SNF placement here and will explore palliative/hospice options in daughter's home state, patient has declined palliative consult here. 07/29-08/01: continues to have subjective SOB relieved by neb tx, SpO2 has been 97% on 3L, continued intermittent chest pain. Cardiology has increased metoprolol and isosrobide, repeat CXR 07/31 and dose lasix IV x1 plan to d/c likely on torsemide 10 mg daily. GOC discussion  with patient, and palliative consult ordered. Palliative recs: Outpatient palliative with transition to comfort/hospice if further decline, patient would not want artificial feeds or rehospitalization.  Awaiting SNF placement/palliative outpatient follow-up confirmed.   Consultants:  Cardiology PCCU (initially admitted to  ICU) Palliative care   Procedures: Intubation 07/20, extubation 07/25 Central line placement 07/20, removed 07/26        Discharge Diagnoses: Principal Problem:   NSTEMI (non-ST elevated myocardial infarction) [facility]) Active Problems:   Unstable angina ([place])   Acute respiratory failure with hypoxia ([place])   AKI (acute kidney injury) ([place]) in the setting of stage IIIa chronic   S/P CABG x 2   Malnutrition of moderate degree   Fever and chills   Goals of care, counseling/discussion    Assessment & Plan:  NSTEMI (non-ST elevated myocardial infarction) ([place]) Unstable angina CAD s/p CABG Ischemic cardiomyopathy HTN HFrEF Managed medically Cardiology following - no further invasive procedures planned, another 40mg  IV lasix today --> on discharge, plan for torsemide 20 mg daily for 3 days starting tomorrow then down to 10 mg daily restart isosorbide 15 mg daily --> increased to 30 mg 07/29 --> 60 mg 07/31 metoprolol XL 12.5mg  --> 25 mg daily Restarted lisinopril 10 mg daily DAPT with aspirin 81 mg and 75 mg clopidogrel daily nitropaste 0.5inch q6h atorvastatin 80 mg nightly (holding spironolactone 12.5 mg) Cardiology states she will likely have chronic angina from her severe CAD --> palliative consult and will follow outpatient  AKI (acute kidney injury) ([place]) in the setting of stage IIIa chronic Improving  Monitor serial BMP  Acute respiratory failure with hypoxia (HCC) Acute encephalopathy RESOLVED, off ventilator  Continue supplemental O2 as needed SOB likely will be chronic d/t CAD/anxiety  Lowered dose alprazolam   Fever and chills RESOLVED Temp was increasing 07/26, no cocnerns repeat BCx, CXR, UA, resp PCR remove foley  Goals of care, counseling/discussion Palliative care following outpatient DNR Patient will experience chronic/recurrent chest pain & shortness of breath, would like to be kept comfortable/treat symptoms rather than return to  hospital Nephew is medical decision maker, since he has medical training.  The rest of the family including patient's daughter and patient endorse this      Discharge Instructions  Allergies as of 01/23/2022       Reactions   Tetanus Toxoids Swelling   Tetanus and diphtheria toxids   Penicillin G Rash        Medication List     STOP taking these medications    baclofen 10 MG tablet Commonly known as: LIORESAL   insulin glargine 100 UNIT/ML injection Commonly known as: LANTUS   insulin lispro 100 UNIT/ML KwikPen Commonly known as: HUMALOG   lisinopril-hydrochlorothiazide 10-12.5 MG tablet Commonly known as: ZESTORETIC   nitroGLYCERIN 0.4 MG SL tablet Commonly known as: NITROSTAT Replaced by: nitroGLYCERIN 2 % ointment   senna 8.6 MG tablet Commonly known as: SENOKOT   Spiriva HandiHaler 18 MCG inhalation capsule Generic drug: tiotropium       TAKE these medications    acetaminophen 325 MG tablet Commonly known as: TYLENOL Take 2 tablets (650 mg total) by mouth every 4 (four) hours as needed for mild pain (temp > 101.5). What changed:  when to take this reasons to take this   albuterol 108 (90 Base) MCG/ACT inhaler Commonly known as: VENTOLIN HFA Inhale 1-2 puffs into the lungs every 4 (four) hours as needed for shortness of breath.   ALPRAZolam 0.25 MG tablet Commonly known as: XANAX Take  0.5 tablets (0.125 mg total) by mouth 3 (three) times daily as needed for anxiety.   aspirin EC 81 MG tablet Take by mouth.   atorvastatin 80 MG tablet Commonly known as: LIPITOR Take 80 mg by mouth every evening.   Cholecalciferol 25 MCG (1000 UT) capsule Take 1,000 Units by mouth 2 (two) times daily.   clopidogrel 75 MG tablet Commonly known as: PLAVIX Take 1 tablet (75 mg total) by mouth daily.   docusate sodium 100 MG capsule Commonly known as: COLACE Take 1 capsule (100 mg total) by mouth 2 (two) times daily as needed for mild constipation.    feeding supplement Liqd Take 237 mLs by mouth 3 (three) times daily between meals.   ipratropium-albuterol 0.5-2.5 (3) MG/3ML Soln Commonly known as: DUONEB Take 3 mLs by nebulization every 6 (six) hours as needed (shortness of breath).   isosorbide mononitrate 60 MG 24 hr tablet Commonly known as: IMDUR Take 1 tablet (60 mg total) by mouth daily. Start taking on: January 24, 2022 What changed:  medication strength how much to take   lisinopril 10 MG tablet Commonly known as: ZESTRIL Take 1 tablet (10 mg total) by mouth daily. Start taking on: January 24, 2022 What changed:  medication strength how much to take   loperamide 2 MG capsule Commonly known as: IMODIUM Take 1 capsule (2 mg total) by mouth as needed for diarrhea or loose stools.   metoprolol succinate 25 MG 24 hr tablet Commonly known as: TOPROL-XL Take 1 tablet (25 mg total) by mouth daily. Start taking on: January 24, 2022 What changed:  medication strength how much to take   multivitamin with minerals Tabs tablet Take 1 tablet by mouth daily.   nitroGLYCERIN 2 % ointment Commonly known as: NITROGLYN Apply 0.5 inches topically every 6 (six) hours as needed for chest pain. Replaces: nitroGLYCERIN 0.4 MG SL tablet   ondansetron 4 MG disintegrating tablet Commonly known as: ZOFRAN-ODT Take 4 mg by mouth every 8 (eight) hours as needed.   pantoprazole 40 MG tablet Commonly known as: PROTONIX Take 1 tablet (40 mg total) by mouth daily. Start taking on: January 24, 2022   polyethylene glycol 17 g packet Commonly known as: MIRALAX / GLYCOLAX Take 17 g by mouth daily as needed for moderate constipation.   spironolactone 25 MG tablet Commonly known as: ALDACTONE Take 12.5 mg by mouth daily.   torsemide 10 MG tablet Commonly known as: DEMADEX Take 2 tablets (20 mg total) by mouth daily for 3 days, THEN 1 tablet (10 mg total) daily for 27 days. Start taking on: January 23, 2022 What changed:  medication  strength See the new instructions.         Contact information for after-discharge care     Destination     [facility] Preferred SNF .   Service: Skilled Nursing Contact information: [place] [address] [phone]                     Allergies  Allergen Reactions   Tetanus Toxoids Swelling    Tetanus and diphtheria toxids   Penicillin G Rash     Subjective: Patient reports another "attack" this morning of trouble breathing which resolved with supportive care/alprazolam.  No chest pain at this time.  Discharge Exam: Vitals:   01/23/22 0416 01/23/22 0827  BP: (!) 146/56 (!) 146/58  Pulse: 75 78  Resp: 18 (!) 21  Temp:  97.6 F (36.4 C)  SpO2: 100% 100%  General: Pt is alert, awake, not in acute distress Cardiovascular: RRR, S1/S2, no gallops Respiratory: CTA bilaterally, no wheezing, no rhonchi Abdominal: Soft, NT, ND, bowel sounds + Extremities: no edema, no cyanosis     The results of significant diagnostics from this hospitalization (including imaging, microbiology, ancillary and laboratory) are listed below for reference.     Microbiology: Recent Results (from the past 240 hour(s))  Culture, blood (Routine X 2) w Reflex to ID Panel     Status: None   Collection Time: 01/17/22  6:00 PM   Specimen: BLOOD  Result Value Ref Range Status   Specimen Description BLOOD RIGHT ANTECUBITAL  Final   Special Requests   Final    BOTTLES DRAWN AEROBIC AND ANAEROBIC Blood Culture adequate volume   Culture   Final    NO GROWTH 5 DAYS Performed at [facility], [address] [place], [place] [address]    Report Status 01/22/2022 FINAL  Final  Culture, blood (Routine X 2) w Reflex to ID Panel     Status: None   Collection Time: 01/17/22  6:00 PM   Specimen: BLOOD  Result Value Ref Range Status   Specimen Description BLOOD BLOOD LEFT WRIST  Final   Special Requests   Final    BOTTLES DRAWN AEROBIC  AND ANAEROBIC Blood Culture adequate volume   Culture   Final    NO GROWTH 5 DAYS Performed at [facility], [address] [place], [place] [address]    Report Status 01/22/2022 FINAL  Final  Remove urinary catheter to obtain Clean Catch urine culture     Status: None   Collection Time: 01/17/22  6:32 PM   Specimen: Urine, Clean Catch  Result Value Ref Range Status   Specimen Description   Final    URINE, CLEAN CATCH Performed at [facility], [address] [place], [place] [address]    Special Requests   Final    NONE Performed at [facility], [address], [place], [place] [address]    Culture   Final    NO GROWTH Performed at [facility], [place] [address], [place], [place] [address]    Report Status 01/18/2022 FINAL  Final  Respiratory (~20 pathogens) panel by PCR     Status: None   Collection Time: 01/17/22  6:32 PM   Specimen: Nasopharyngeal Swab; Respiratory  Result Value Ref Range Status   Adenovirus NOT DETECTED NOT DETECTED Final   Coronavirus 229E NOT DETECTED NOT DETECTED Final    Comment: (NOTE) The Coronavirus on the Respiratory Panel, DOES NOT test for the novel  Coronavirus (2019 nCoV)    Coronavirus HKU1 NOT DETECTED NOT DETECTED Final   Coronavirus NL63 NOT DETECTED NOT DETECTED Final   Coronavirus OC43 NOT DETECTED NOT DETECTED Final   Metapneumovirus NOT DETECTED NOT DETECTED Final   Rhinovirus / Enterovirus NOT DETECTED NOT DETECTED Final   Influenza A NOT DETECTED NOT DETECTED Final   Influenza B NOT DETECTED NOT DETECTED Final   Parainfluenza Virus 1 NOT DETECTED NOT DETECTED Final   Parainfluenza Virus 2 NOT DETECTED NOT DETECTED Final   Parainfluenza Virus 3 NOT DETECTED NOT DETECTED Final   Parainfluenza Virus 4 NOT DETECTED NOT DETECTED Final   Respiratory Syncytial Virus NOT DETECTED NOT DETECTED Final   Bordetella pertussis NOT DETECTED NOT DETECTED Final   Bordetella Parapertussis NOT DETECTED NOT  DETECTED Final   Chlamydophila pneumoniae NOT DETECTED NOT DETECTED Final  Mycoplasma pneumoniae NOT DETECTED NOT DETECTED Final    Comment: Performed at [facility], [place] [address] [place], [place] [address]     Labs: BNP (last 3 results) Recent Labs    12/06/21 0837 01/03/22 1826 01/11/22 0713  BNP 384.0* 120.8* [id number]*   Basic Metabolic Panel: Recent Labs  Lab 01/17/22 0330 01/18/22 0419 01/19/22 0511 01/20/22 0451 01/22/22 0612  NA 139 135 135 135 137  K 3.7 4.2 3.4* 3.6 4.4  CL 103 99 98 96* 97*  CO2 27 24 30 30 29   GLUCOSE 98 100* 105* 172* 98  BUN 49* 52* 49* 40* 31*  CREATININE 1.20* 1.06* 1.00 1.07* 0.74  CALCIUM 8.5* 8.5* 8.3* 8.9 8.6*   Liver Function Tests: Recent Labs  Lab 01/18/22 0419  AST 21  ALT 16  ALKPHOS 56  BILITOT 0.8  PROT 6.1*  ALBUMIN 2.9*   No results for input(s): "LIPASE", "AMYLASE" in the last 168 hours. No results for input(s): "AMMONIA" in the last 168 hours. CBC: Recent Labs  Lab 01/17/22 1153 01/18/22 0419 01/20/22 0451 01/22/22 0612  WBC 13.2* 11.7* 11.4* 8.9  NEUTROABS  --  8.3*  --   --   HGB 8.1* 8.2* 9.0* 8.9*  HCT 26.3* 26.4* 28.1* 29.2*  MCV 100.8* 96.7 96.2 99.0  PLT 361 351 465* 343   Cardiac Enzymes: No results for input(s): "CKTOTAL", "CKMB", "CKMBINDEX", "TROPONINI" in the last 168 hours. BNP: Invalid input(s): "POCBNP" CBG: Recent Labs  Lab 01/22/22 1258 01/22/22 1717 01/22/22 2137 01/23/22 0829 01/23/22 1200  GLUCAP 131* 137* 92 115* 139*   D-Dimer No results for input(s): "DDIMER" in the last 72 hours. Hgb A1c No results for input(s): "HGBA1C" in the last 72 hours. Lipid Profile No results for input(s): "CHOL", "HDL", "LDLCALC", "TRIG", "CHOLHDL", "LDLDIRECT" in the last 72 hours. Thyroid function studies No results for input(s): "TSH", "T4TOTAL", "T3FREE", "THYROIDAB" in the last 72 hours.  Invalid input(s): "FREET3" Anemia work up No results for input(s): "VITAMINB12",  "FOLATE", "FERRITIN", "TIBC", "IRON", "RETICCTPCT" in the last 72 hours. Urinalysis    Component Value Date/Time   COLORURINE STRAW (A) 01/17/2022 1832   APPEARANCEUR CLEAR (A) 01/17/2022 1832   LABSPEC 1.006 01/17/2022 1832   PHURINE 5.0 01/17/2022 1832   GLUCOSEU NEGATIVE 01/17/2022 1832   HGBUR NEGATIVE 01/17/2022 1832   BILIRUBINUR NEGATIVE 01/17/2022 1832   KETONESUR NEGATIVE 01/17/2022 1832   PROTEINUR NEGATIVE 01/17/2022 1832   NITRITE NEGATIVE 01/17/2022 1832   LEUKOCYTESUR NEGATIVE 01/17/2022 1832   Sepsis Labs Recent Labs  Lab 01/17/22 1153 01/18/22 0419 01/20/22 0451 01/22/22 0612  WBC 13.2* 11.7* 11.4* 8.9   Microbiology Recent Results (from the past 240 hour(s))  Culture, blood (Routine X 2) w Reflex to ID Panel     Status: None   Collection Time: 01/17/22  6:00 PM   Specimen: BLOOD  Result Value Ref Range Status   Specimen Description BLOOD RIGHT ANTECUBITAL  Final   Special Requests   Final    BOTTLES DRAWN AEROBIC AND ANAEROBIC Blood Culture adequate volume   Culture   Final    NO GROWTH 5 DAYS Performed at [facility], [address], [place], [place] [address]    Report Status 01/22/2022 FINAL  Final  Culture, blood (Routine X 2) w Reflex to ID Panel     Status: None   Collection Time: 01/17/22  6:00 PM   Specimen: BLOOD  Result Value Ref Range Status   Specimen Description BLOOD BLOOD  LEFT WRIST  Final   Special Requests   Final    BOTTLES DRAWN AEROBIC AND ANAEROBIC Blood Culture adequate volume   Culture   Final    NO GROWTH 5 DAYS Performed at [facility], [address] [place], [place] [address]    Report Status 01/22/2022 FINAL  Final  Remove urinary catheter to obtain Clean Catch urine culture     Status: None   Collection Time: 01/17/22  6:32 PM   Specimen: Urine, Clean Catch  Result Value Ref Range Status   Specimen Description   Final    URINE, CLEAN CATCH Performed at [facility], [address] [place], [place] [address]    Special Requests   Final    NONE Performed at [facility], [address], [place], [place] [address]    Culture   Final    NO GROWTH Performed at [facility], [address], [place], [place] [address]    Report Status 01/18/2022 FINAL  Final  Respiratory (~20 pathogens) panel by PCR     Status: None   Collection Time: 01/17/22  6:32 PM   Specimen: Nasopharyngeal Swab; Respiratory  Result Value Ref Range Status   Adenovirus NOT DETECTED NOT DETECTED Final   Coronavirus 229E NOT DETECTED NOT DETECTED Final    Comment: (NOTE) The Coronavirus on the Respiratory Panel, DOES NOT test for the novel  Coronavirus (2019 nCoV)    Coronavirus HKU1 NOT DETECTED NOT DETECTED Final   Coronavirus NL63 NOT DETECTED NOT DETECTED Final   Coronavirus OC43 NOT DETECTED NOT DETECTED Final   Metapneumovirus NOT DETECTED NOT DETECTED Final   Rhinovirus / Enterovirus NOT DETECTED NOT DETECTED Final   Influenza A NOT DETECTED NOT DETECTED Final   Influenza B NOT DETECTED NOT DETECTED Final   Parainfluenza Virus 1 NOT DETECTED NOT DETECTED Final   Parainfluenza Virus 2 NOT DETECTED NOT DETECTED Final   Parainfluenza Virus 3 NOT DETECTED NOT DETECTED Final   Parainfluenza Virus 4 NOT DETECTED NOT DETECTED Final   Respiratory Syncytial Virus NOT DETECTED NOT DETECTED Final   Bordetella pertussis NOT DETECTED NOT DETECTED Final   Bordetella Parapertussis NOT DETECTED NOT DETECTED Final   Chlamydophila pneumoniae NOT DETECTED NOT DETECTED Final   Mycoplasma pneumoniae NOT DETECTED NOT DETECTED Final    Comment: Performed at [facility], [address], [place], [place] [address]   Imaging ECHOCARDIOGRAM LIMITED  Result Date: 01/12/2022    ECHOCARDIOGRAM LIMITED REPORT   Patient Name:   Lindsey Pollard [facility] Date of Exam: 01/12/2022 Medical Rec #:  [id number]     Height:       59.0 in Accession #:    [id number]    Weight:       140.4 lb Date of Birth:   23-Jun-1941     BSA:          1.587 m Patient Age:    81 years      BP:           146/47 mmHg Patient Gender: F             HR:           51 bpm. Exam Location:  [facility] Procedure: Limited Echo, Cardiac Doppler, Color Doppler and Intracardiac            Opacification Agent Indications:     Elevated Troponin  History:         Patient has prior  history of Echocardiogram examinations, most                  recent 01/04/2022.  Sonographer:     Sherrie Sport Referring Phys:  [id number] [place] TANG Diagnosing Phys: Yolonda Kida MD  Sonographer Comments: Suboptimal parasternal window and suboptimal apical window. IMPRESSIONS  1. Left ventricular ejection fraction, by estimation, is 45 to 50%. The left ventricle has mildly decreased function. The left ventricle demonstrates global hypokinesis. The left ventricular internal cavity size was mildly dilated. Left ventricular diastolic function could not be evaluated.  2. Right ventricular systolic function is normal.  3. Moderate pleural effusion in the left lateral region.  4. The mitral valve is normal in structure.  5. The aortic valve is normal in structure. Aortic valve regurgitation is not visualized. Conclusion(s)/Recommendation(s): Poor windows for evaluation of left ventricular function by transthoracic echocardiography. Would recommend an alternative means of evaluation. FINDINGS  Left Ventricle: Left ventricular ejection fraction, by estimation, is 45 to 50%. The left ventricle has mildly decreased function. The left ventricle demonstrates global hypokinesis. Definity contrast agent was given IV to delineate the left ventricular  endocardial borders. The left ventricular internal cavity size was mildly dilated. There is no left ventricular hypertrophy. Left ventricular diastolic function could not be evaluated. Right Ventricle: No increase in right ventricular wall thickness. Right ventricular systolic function is normal. Left Atrium: Left atrial size was normal in  size. Right Atrium: Right atrial size was normal in size. Mitral Valve: The mitral valve is normal in structure. Tricuspid Valve: The tricuspid valve is normal in structure. Aortic Valve: The aortic valve is normal in structure. Aortic valve regurgitation is not visualized. Pulmonic Valve: The pulmonic valve was normal in structure. Aorta: The ascending aorta was not well visualized. IAS/Shunts: No atrial level shunt detected by color flow Doppler. Additional Comments: There is a moderate pleural effusion in the left lateral region. LEFT VENTRICLE PLAX 2D LVIDd:         4.70 cm LVIDs:         3.40 cm LV PW:         1.10 cm LV IVS:        0.90 cm LVOT diam:     2.00 cm LVOT Area:     3.14 cm  LEFT ATRIUM         Index LA diam:    3.00 cm 1.89 cm/m   AORTA Ao Root diam: 2.90 cm TRICUSPID VALVE TR Peak grad:   28.9 mmHg TR Vmax:        269.00 cm/s  SHUNTS Systemic Diam: 2.00 cm Yolonda Kida MD Electronically signed by Yolonda Kida MD Signature Date/Time: 01/12/2022/2:52:11 PM    Final       Time coordinating discharge: Over 30 minutes  SIGNED:  Emeterio Reeve DO [facility]

## 2022-01-24 LAB — BLOOD GAS, ARTERIAL
Bicarbonate: 19.9 mmol/L — ABNORMAL LOW (ref 20.0–28.0)
O2 Saturation: 99.5 %
pCO2 arterial: 36 mmHg (ref 32–48)
pH, Arterial: 7.35 (ref 7.35–7.45)
pO2, Arterial: 391 mmHg — ABNORMAL HIGH (ref 83–108)

## 2022-01-25 ENCOUNTER — Ambulatory Visit: Payer: Medicare Other | Admitting: Family

## 2022-01-29 ENCOUNTER — Emergency Department (HOSPITAL_COMMUNITY): Payer: Medicare Other

## 2022-01-29 ENCOUNTER — Inpatient Hospital Stay (HOSPITAL_COMMUNITY)
Admission: EM | Admit: 2022-01-29 | Discharge: 2022-02-03 | DRG: 280 | Disposition: A | Discharge status: Skilled Nursing Facility | Payer: Medicare Other | Source: Skilled Nursing Facility | Attending: Internal Medicine | Admitting: Internal Medicine

## 2022-01-29 ENCOUNTER — Encounter (HOSPITAL_COMMUNITY): Payer: Self-pay

## 2022-01-29 ENCOUNTER — Other Ambulatory Visit: Payer: Self-pay

## 2022-01-29 DIAGNOSIS — I5023 Acute on chronic systolic (congestive) heart failure: Secondary | ICD-10-CM | POA: Diagnosis present

## 2022-01-29 DIAGNOSIS — E1122 Type 2 diabetes mellitus with diabetic chronic kidney disease: Secondary | ICD-10-CM | POA: Diagnosis present

## 2022-01-29 DIAGNOSIS — Z951 Presence of aortocoronary bypass graft: Secondary | ICD-10-CM

## 2022-01-29 DIAGNOSIS — E875 Hyperkalemia: Secondary | ICD-10-CM | POA: Diagnosis present

## 2022-01-29 DIAGNOSIS — I130 Hypertensive heart and chronic kidney disease with heart failure and stage 1 through stage 4 chronic kidney disease, or unspecified chronic kidney disease: Secondary | ICD-10-CM | POA: Diagnosis present

## 2022-01-29 DIAGNOSIS — N1831 Chronic kidney disease, stage 3a: Secondary | ICD-10-CM | POA: Diagnosis present

## 2022-01-29 DIAGNOSIS — Z789 Other specified health status: Secondary | ICD-10-CM

## 2022-01-29 DIAGNOSIS — Z6825 Body mass index (BMI) 25.0-25.9, adult: Secondary | ICD-10-CM

## 2022-01-29 DIAGNOSIS — I10 Essential (primary) hypertension: Secondary | ICD-10-CM | POA: Diagnosis present

## 2022-01-29 DIAGNOSIS — I440 Atrioventricular block, first degree: Secondary | ICD-10-CM | POA: Diagnosis present

## 2022-01-29 DIAGNOSIS — I25118 Atherosclerotic heart disease of native coronary artery with other forms of angina pectoris: Secondary | ICD-10-CM

## 2022-01-29 DIAGNOSIS — J9601 Acute respiratory failure with hypoxia: Secondary | ICD-10-CM | POA: Diagnosis present

## 2022-01-29 DIAGNOSIS — Z87891 Personal history of nicotine dependence: Secondary | ICD-10-CM

## 2022-01-29 DIAGNOSIS — R0602 Shortness of breath: Secondary | ICD-10-CM | POA: Diagnosis not present

## 2022-01-29 DIAGNOSIS — Z8249 Family history of ischemic heart disease and other diseases of the circulatory system: Secondary | ICD-10-CM

## 2022-01-29 DIAGNOSIS — E871 Hypo-osmolality and hyponatremia: Secondary | ICD-10-CM | POA: Diagnosis present

## 2022-01-29 DIAGNOSIS — L8990 Pressure ulcer of unspecified site, unspecified stage: Secondary | ICD-10-CM | POA: Diagnosis present

## 2022-01-29 DIAGNOSIS — Z66 Do not resuscitate: Secondary | ICD-10-CM | POA: Diagnosis present

## 2022-01-29 DIAGNOSIS — Z515 Encounter for palliative care: Secondary | ICD-10-CM

## 2022-01-29 DIAGNOSIS — I469 Cardiac arrest, cause unspecified: Secondary | ICD-10-CM | POA: Diagnosis present

## 2022-01-29 DIAGNOSIS — I214 Non-ST elevation (NSTEMI) myocardial infarction: Secondary | ICD-10-CM | POA: Diagnosis present

## 2022-01-29 DIAGNOSIS — L89152 Pressure ulcer of sacral region, stage 2: Secondary | ICD-10-CM | POA: Diagnosis present

## 2022-01-29 DIAGNOSIS — Z7189 Other specified counseling: Secondary | ICD-10-CM | POA: Diagnosis not present

## 2022-01-29 DIAGNOSIS — E119 Type 2 diabetes mellitus without complications: Secondary | ICD-10-CM | POA: Diagnosis present

## 2022-01-29 DIAGNOSIS — I2510 Atherosclerotic heart disease of native coronary artery without angina pectoris: Secondary | ICD-10-CM | POA: Diagnosis present

## 2022-01-29 DIAGNOSIS — Z955 Presence of coronary angioplasty implant and graft: Secondary | ICD-10-CM | POA: Diagnosis not present

## 2022-01-29 DIAGNOSIS — Z7982 Long term (current) use of aspirin: Secondary | ICD-10-CM

## 2022-01-29 DIAGNOSIS — E782 Mixed hyperlipidemia: Secondary | ICD-10-CM | POA: Diagnosis present

## 2022-01-29 DIAGNOSIS — N179 Acute kidney failure, unspecified: Secondary | ICD-10-CM | POA: Diagnosis present

## 2022-01-29 DIAGNOSIS — Z833 Family history of diabetes mellitus: Secondary | ICD-10-CM | POA: Diagnosis not present

## 2022-01-29 DIAGNOSIS — I255 Ischemic cardiomyopathy: Secondary | ICD-10-CM | POA: Diagnosis present

## 2022-01-29 DIAGNOSIS — E440 Moderate protein-calorie malnutrition: Secondary | ICD-10-CM | POA: Diagnosis present

## 2022-01-29 DIAGNOSIS — Z79899 Other long term (current) drug therapy: Secondary | ICD-10-CM

## 2022-01-29 DIAGNOSIS — R0603 Acute respiratory distress: Principal | ICD-10-CM

## 2022-01-29 DIAGNOSIS — E1129 Type 2 diabetes mellitus with other diabetic kidney complication: Secondary | ICD-10-CM | POA: Diagnosis present

## 2022-01-29 DIAGNOSIS — F419 Anxiety disorder, unspecified: Secondary | ICD-10-CM | POA: Diagnosis present

## 2022-01-29 DIAGNOSIS — Z7902 Long term (current) use of antithrombotics/antiplatelets: Secondary | ICD-10-CM

## 2022-01-29 DIAGNOSIS — Z711 Person with feared health complaint in whom no diagnosis is made: Secondary | ICD-10-CM | POA: Diagnosis not present

## 2022-01-29 LAB — I-STAT ARTERIAL BLOOD GAS, ED
Acid-Base Excess: 3 mmol/L — ABNORMAL HIGH (ref 0.0–2.0)
Bicarbonate: 28.9 mmol/L — ABNORMAL HIGH (ref 20.0–28.0)
Calcium, Ion: 1.14 mmol/L — ABNORMAL LOW (ref 1.15–1.40)
HCT: 25 % — ABNORMAL LOW (ref 36.0–46.0)
Hemoglobin: 8.5 g/dL — ABNORMAL LOW (ref 12.0–15.0)
O2 Saturation: 96 %
Potassium: 4.1 mmol/L (ref 3.5–5.1)
Sodium: 130 mmol/L — ABNORMAL LOW (ref 135–145)
TCO2: 30 mmol/L (ref 22–32)
pCO2 arterial: 49 mmHg — ABNORMAL HIGH (ref 32–48)
pH, Arterial: 7.378 (ref 7.35–7.45)
pO2, Arterial: 83 mmHg (ref 83–108)

## 2022-01-29 LAB — TROPONIN I (HIGH SENSITIVITY)
Troponin I (High Sensitivity): 30 ng/L — ABNORMAL HIGH (ref <18)
Troponin I (High Sensitivity): 64 ng/L — ABNORMAL HIGH (ref <18)
Troponin I (High Sensitivity): 207 ng/L (ref <18)
Troponin I (High Sensitivity): 293 ng/L (ref <18)

## 2022-01-29 LAB — CBC WITH DIFFERENTIAL/PLATELET
Abs Immature Granulocytes: 0.07 10*3/uL (ref 0.00–0.07)
Basophils Absolute: 0.1 10*3/uL (ref 0.0–0.1)
Basophils Relative: 1 %
Eosinophils Absolute: 0.3 10*3/uL (ref 0.0–0.5)
Eosinophils Relative: 2 %
HCT: 29.2 % — ABNORMAL LOW (ref 36.0–46.0)
Hemoglobin: 9.1 g/dL — ABNORMAL LOW (ref 12.0–15.0)
Immature Granulocytes: 1 %
Lymphocytes Relative: 16 %
Lymphs Abs: 2.1 10*3/uL (ref 0.7–4.0)
MCH: 29.4 pg (ref 26.0–34.0)
MCHC: 31.2 g/dL (ref 30.0–36.0)
MCV: 94.5 fL (ref 80.0–100.0)
Monocytes Absolute: 1 10*3/uL (ref 0.1–1.0)
Monocytes Relative: 8 %
Neutro Abs: 10 10*3/uL — ABNORMAL HIGH (ref 1.7–7.7)
Neutrophils Relative %: 72 %
Platelets: 365 10*3/uL (ref 150–400)
RBC: 3.09 MIL/uL — ABNORMAL LOW (ref 3.87–5.11)
RDW: 14.6 % (ref 11.5–15.5)
WBC: 13.6 10*3/uL — ABNORMAL HIGH (ref 4.0–10.5)
nRBC: 0 % (ref 0.0–0.2)

## 2022-01-29 LAB — COMPREHENSIVE METABOLIC PANEL
ALT: 28 U/L (ref 0–44)
AST: 33 U/L (ref 15–41)
Albumin: 3.3 g/dL — ABNORMAL LOW (ref 3.5–5.0)
Alkaline Phosphatase: 103 U/L (ref 38–126)
Anion gap: 11 (ref 5–15)
BUN: 55 mg/dL — ABNORMAL HIGH (ref 8–23)
CO2: 27 mmol/L (ref 22–32)
Calcium: 8.8 mg/dL — ABNORMAL LOW (ref 8.9–10.3)
Chloride: 94 mmol/L — ABNORMAL LOW (ref 98–111)
Creatinine, Ser: 1.5 mg/dL — ABNORMAL HIGH (ref 0.44–1.00)
GFR, Estimated: 35 mL/min — ABNORMAL LOW (ref >60)
Glucose, Bld: 217 mg/dL — ABNORMAL HIGH (ref 70–99)
Potassium: 4.5 mmol/L (ref 3.5–5.1)
Sodium: 132 mmol/L — ABNORMAL LOW (ref 135–145)
Total Bilirubin: 1 mg/dL (ref 0.3–1.2)
Total Protein: 6.1 g/dL — ABNORMAL LOW (ref 6.5–8.1)

## 2022-01-29 LAB — BRAIN NATRIURETIC PEPTIDE: B Natriuretic Peptide: 1815.2 pg/mL — ABNORMAL HIGH (ref 0.0–100.0)

## 2022-01-29 LAB — GLUCOSE, CAPILLARY: Glucose-Capillary: 251 mg/dL — ABNORMAL HIGH (ref 70–99)

## 2022-01-29 LAB — MAGNESIUM: Magnesium: 2.7 mg/dL — ABNORMAL HIGH (ref 1.7–2.4)

## 2022-01-29 MED ORDER — PANTOPRAZOLE SODIUM 40 MG PO TBEC
40.0000 mg | DELAYED_RELEASE_TABLET | Freq: Every evening | ORAL | Status: DC
  Administered 2022-01-30 – 2022-02-02 (×4): 40 mg via ORAL
  Filled 2022-01-29 (×4): qty 1

## 2022-01-29 MED ORDER — MORPHINE SULFATE (PF) 2 MG/ML IV SOLN: 2.0000 mg | INTRAVENOUS | Status: DC | PRN

## 2022-01-29 MED ORDER — METOPROLOL SUCCINATE ER 25 MG PO TB24
25.0000 mg | ORAL_TABLET | Freq: Every day | ORAL | Status: DC
  Administered 2022-01-30 – 2022-02-03 (×5): 25 mg via ORAL
  Filled 2022-01-29 (×6): qty 1

## 2022-01-29 MED ORDER — DOCUSATE SODIUM 100 MG PO CAPS
100.0000 mg | ORAL_CAPSULE | Freq: Two times a day (BID) | ORAL | Status: DC
  Administered 2022-01-29 – 2022-02-03 (×8): 100 mg via ORAL
  Filled 2022-01-29 (×10): qty 1

## 2022-01-29 MED ORDER — ATORVASTATIN CALCIUM 80 MG PO TABS
80.0000 mg | ORAL_TABLET | Freq: Every evening | ORAL | Status: DC
  Administered 2022-01-30 – 2022-02-02 (×4): 80 mg via ORAL
  Filled 2022-01-29 (×4): qty 1

## 2022-01-29 MED ORDER — ACETAMINOPHEN 325 MG PO TABS: 650.0000 mg | ORAL_TABLET | Freq: Four times a day (QID) | ORAL | Status: DC | PRN

## 2022-01-29 MED ORDER — FUROSEMIDE 10 MG/ML IJ SOLN
40.0000 mg | Freq: Two times a day (BID) | INTRAMUSCULAR | Status: DC
  Administered 2022-01-29 – 2022-02-02 (×8): 40 mg via INTRAVENOUS
  Filled 2022-01-29 (×8): qty 4

## 2022-01-29 MED ORDER — OXYCODONE HCL 5 MG PO TABS
5.0000 mg | ORAL_TABLET | ORAL | Status: DC | PRN
  Administered 2022-02-02: 5 mg via ORAL
  Filled 2022-01-29: qty 1

## 2022-01-29 MED ORDER — BISACODYL 5 MG PO TBEC: 5.0000 mg | DELAYED_RELEASE_TABLET | Freq: Every day | ORAL | Status: DC | PRN

## 2022-01-29 MED ORDER — ACETAMINOPHEN 650 MG RE SUPP: 650.0000 mg | Freq: Four times a day (QID) | RECTAL | Status: DC | PRN

## 2022-01-29 MED ORDER — FUROSEMIDE 10 MG/ML IJ SOLN
40.0000 mg | Freq: Two times a day (BID) | INTRAMUSCULAR | Status: DC
Start: 2022-01-29 — End: 2022-01-29

## 2022-01-29 MED ORDER — ENOXAPARIN SODIUM 30 MG/0.3ML IJ SOSY
30.0000 mg | PREFILLED_SYRINGE | INTRAMUSCULAR | Status: DC
  Administered 2022-01-29 – 2022-02-02 (×5): 30 mg via SUBCUTANEOUS
  Filled 2022-01-29 (×5): qty 0.3

## 2022-01-29 MED ORDER — CLOPIDOGREL BISULFATE 75 MG PO TABS
75.0000 mg | ORAL_TABLET | Freq: Every evening | ORAL | Status: DC
  Administered 2022-01-30 – 2022-02-02 (×4): 75 mg via ORAL
  Filled 2022-01-29 (×4): qty 1

## 2022-01-29 MED ORDER — ISOSORBIDE MONONITRATE ER 60 MG PO TB24
60.0000 mg | ORAL_TABLET | Freq: Every day | ORAL | Status: DC
  Filled 2022-01-29 (×2): qty 1

## 2022-01-29 MED ORDER — MAGNESIUM SULFATE 2 GM/50ML IV SOLN
2.0000 g | Freq: Once | INTRAVENOUS | Status: AC
  Administered 2022-01-29: 2 g via INTRAVENOUS

## 2022-01-29 MED ORDER — IPRATROPIUM-ALBUTEROL 0.5-2.5 (3) MG/3ML IN SOLN: 3.0000 mL | Freq: Four times a day (QID) | RESPIRATORY_TRACT | Status: DC | PRN

## 2022-01-29 MED ORDER — LORAZEPAM 2 MG/ML IJ SOLN: 0.5000 mg | INTRAMUSCULAR | Status: DC | PRN

## 2022-01-29 MED ORDER — ASPIRIN 81 MG PO TBEC
81.0000 mg | DELAYED_RELEASE_TABLET | Freq: Every evening | ORAL | Status: DC
Start: 2022-01-29 — End: 2022-02-03
  Administered 2022-01-30 – 2022-02-02 (×4): 81 mg via ORAL
  Filled 2022-01-29 (×4): qty 1

## 2022-01-29 MED ORDER — SPIRONOLACTONE 12.5 MG HALF TABLET
12.5000 mg | ORAL_TABLET | Freq: Every morning | ORAL | Status: DC
  Administered 2022-01-30 – 2022-02-03 (×5): 12.5 mg via ORAL
  Filled 2022-01-29 (×5): qty 1

## 2022-01-29 MED ORDER — POLYETHYLENE GLYCOL 3350 17 G PO PACK: 17.0000 g | PACK | Freq: Every day | ORAL | Status: DC | PRN

## 2022-01-29 MED ORDER — SODIUM CHLORIDE 0.9% FLUSH
3.0000 mL | Freq: Two times a day (BID) | INTRAVENOUS | Status: DC
  Administered 2022-01-29 – 2022-02-03 (×9): 3 mL via INTRAVENOUS

## 2022-01-29 MED ORDER — ALPRAZOLAM 0.25 MG PO TABS
0.1250 mg | ORAL_TABLET | Freq: Three times a day (TID) | ORAL | Status: DC | PRN
  Administered 2022-01-29 – 2022-02-03 (×7): 0.125 mg via ORAL
  Filled 2022-01-29 (×7): qty 1

## 2022-01-29 MED ORDER — ONDANSETRON HCL 4 MG PO TABS: 4.0000 mg | ORAL_TABLET | Freq: Four times a day (QID) | ORAL | Status: DC | PRN

## 2022-01-29 MED ORDER — TRAZODONE HCL 50 MG PO TABS
25.0000 mg | ORAL_TABLET | Freq: Every evening | ORAL | Status: DC | PRN
  Administered 2022-02-03: 25 mg via ORAL
  Filled 2022-01-29: qty 1

## 2022-01-29 MED ORDER — HYDRALAZINE HCL 20 MG/ML IJ SOLN
5.0000 mg | INTRAMUSCULAR | Status: DC | PRN
  Administered 2022-01-29: 5 mg via INTRAVENOUS
  Filled 2022-01-29: qty 1

## 2022-01-29 MED ORDER — MORPHINE SULFATE (PF) 2 MG/ML IV SOLN
2.0000 mg | INTRAVENOUS | Status: DC | PRN
  Administered 2022-01-29: 2 mg via INTRAVENOUS
  Filled 2022-01-29: qty 1

## 2022-01-29 MED ORDER — ONDANSETRON HCL 4 MG/2ML IJ SOLN
4.0000 mg | Freq: Four times a day (QID) | INTRAMUSCULAR | Status: DC | PRN
  Administered 2022-01-29: 4 mg via INTRAVENOUS
  Filled 2022-01-29: qty 2

## 2022-01-29 MED ORDER — ALBUTEROL SULFATE (2.5 MG/3ML) 0.083% IN NEBU
3.0000 mL | INHALATION_SOLUTION | RESPIRATORY_TRACT | Status: DC | PRN
Start: 2022-01-29 — End: 2022-02-03

## 2022-01-29 NOTE — Progress Notes (Signed)
RT called to patient room due to patient having increased HR and increased WOB.  Patient was placed back on bipap and is currently tolerating well.  Will continue to monitor.

## 2022-01-29 NOTE — ED Provider Notes (Signed)
[facility] EMERGENCY DEPARTMENT Provider Note   CSN: [id number] Arrival date & time: 01/29/22  1779     History {Add pertinent medical, surgical, social history, OB history to HPI:1} No chief complaint on file.   Lindsey Pollard is a 81 y.o. female with past medical history of recent V-fib arrest, HFpEF, COPD, ischemic cardiomyopathy, coronary artery disease status post CABG x2 in 2018, CKD 3, diabetes who presents emerged department for sudden onset dyspnea.  The patient had woken up this morning with sudden onset severe dyspnea at her skilled nursing facility.  EMS was summoned and found the patient to be hypertensive to the 200 systolic with rales and wheezes on exam.  EMS given nitroglycerin with subsequent improvement in patient blood pressure, placed on CPAP, given Solu-Medrol and DuoNebs and was sent to the ED.  Patient arrives in respiratory distress and is able to provide minimal history secondary to respiratory distress.  She is able to state that she has had no associated chest pain, fever, chills or new cough.  HPI     Home Medications Prior to Admission medications   Medication Sig Start Date End Date Taking? Authorizing Provider  acetaminophen (TYLENOL) 325 MG tablet Take 2 tablets (650 mg total) by mouth every 4 (four) hours as needed for mild pain (temp > 101.5). 01/23/22   Sunnie Nielsen, DO  albuterol (VENTOLIN HFA) 108 (90 Base) MCG/ACT inhaler Inhale 1-2 puffs into the lungs every 4 (four) hours as needed for shortness of breath. 08/09/21   [provider]  ALPRAZolam Prudy Feeler) 0.25 MG tablet Take 0.5 tablets (0.125 mg total) by mouth 3 (three) times daily as needed for anxiety. 01/23/22   Sunnie Nielsen, DO  aspirin 81 MG EC tablet Take by mouth.    [provider]  atorvastatin (LIPITOR) 80 MG tablet Take 80 mg by mouth every evening. 07/06/20   [provider]  Cholecalciferol 25 MCG (1000 UT) capsule Take 1,000 Units by  mouth 2 (two) times daily.    [provider]  clopidogrel (PLAVIX) 75 MG tablet Take 1 tablet (75 mg total) by mouth daily. 06/04/21   Alford Highland, MD  docusate sodium (COLACE) 100 MG capsule Take 1 capsule (100 mg total) by mouth 2 (two) times daily as needed for mild constipation. 01/23/22   Sunnie Nielsen, DO  feeding supplement (ENSURE ENLIVE / ENSURE PLUS) LIQD Take 237 mLs by mouth 3 (three) times daily between meals. 01/09/22   Hollice Espy, MD  ipratropium-albuterol (DUONEB) 0.5-2.5 (3) MG/3ML SOLN Take 3 mLs by nebulization every 6 (six) hours as needed (shortness of breath). 08/29/21   [provider]  isosorbide mononitrate (IMDUR) 60 MG 24 hr tablet Take 1 tablet (60 mg total) by mouth daily. 01/24/22   Sunnie Nielsen, DO  lisinopril (ZESTRIL) 10 MG tablet Take 1 tablet (10 mg total) by mouth daily. 01/24/22   Sunnie Nielsen, DO  loperamide (IMODIUM) 2 MG capsule Take 1 capsule (2 mg total) by mouth as needed for diarrhea or loose stools. 01/23/22   Sunnie Nielsen, DO  metoprolol succinate (TOPROL-XL) 25 MG 24 hr tablet Take 1 tablet (25 mg total) by mouth daily. 01/24/22   Sunnie Nielsen, DO  morphine 10 MG/5ML solution Take 2.5 mLs (5 mg total) by mouth every 2 (two) hours as needed (increased work of breathing, shortness of breath). 01/23/22   Barbara Cower, NP  Multiple Vitamin (MULTIVITAMIN WITH MINERALS) TABS tablet Take 1 tablet by mouth daily.  [provider]  nitroGLYCERIN (NITROGLYN) 2 % ointment Apply 0.5 inches topically every 6 (six) hours as needed for chest pain. 01/23/22   Sunnie Nielsen, DO  ondansetron (ZOFRAN-ODT) 4 MG disintegrating tablet Take 4 mg by mouth every 8 (eight) hours as needed. 11/23/21   [provider]  pantoprazole (PROTONIX) 40 MG tablet Take 1 tablet (40 mg total) by mouth daily. 01/24/22   Sunnie Nielsen, DO  polyethylene glycol (MIRALAX / GLYCOLAX) 17 g packet Take 17 g by mouth daily as  needed for moderate constipation. 01/23/22   Sunnie Nielsen, DO  spironolactone (ALDACTONE) 25 MG tablet Take 12.5 mg by mouth daily. 09/05/21   [provider]  torsemide (DEMADEX) 10 MG tablet Take 2 tablets (20 mg total) by mouth daily for 3 days, THEN 1 tablet (10 mg total) daily for 27 days. 01/23/22 02/22/22  Sunnie Nielsen, DO      Allergies    Tetanus toxoids and Penicillin g    Review of Systems   Review of Systems  Constitutional:  Negative for chills and fever.  HENT:  Negative for ear pain and sore throat.   Eyes:  Negative for pain and visual disturbance.  Respiratory:  Positive for shortness of breath. Negative for cough.   Cardiovascular:  Negative for chest pain and palpitations.  Gastrointestinal:  Negative for abdominal pain and vomiting.  Genitourinary:  Negative for dysuria and hematuria.  Musculoskeletal:  Negative for arthralgias and back pain.  Skin:  Negative for color change and rash.  Neurological:  Negative for seizures and syncope.  All other systems reviewed and are negative.   Physical Exam Updated Vital Signs There were no vitals taken for this visit. Physical Exam Vitals and nursing note reviewed.  Constitutional:      General: She is in acute distress.     Appearance: She is well-developed. She is ill-appearing.     Comments: Upon arrival, the patient is sitting upright tachypneic, anxious appearing, ill-appearing and is in respiratory distress with tachypnea.  HENT:     Head: Normocephalic and atraumatic.  Eyes:     Conjunctiva/sclera: Conjunctivae normal.  Cardiovascular:     Rate and Rhythm: Normal rate and regular rhythm.     Heart sounds: No murmur heard. Pulmonary:     Effort: Respiratory distress present.     Breath sounds: Rales present.     Comments: Bibasilar Rales appreciated on exam. Diminished bilaterally without discernable wheezing. Tachypnea with accessory muscle usage.  Mild to moderate JVD.  Patient is in  respiratory distress upon arrival Abdominal:     Palpations: Abdomen is soft.     Tenderness: There is no abdominal tenderness.  Musculoskeletal:        General: No swelling.     Cervical back: Neck supple.  Skin:    General: Skin is warm and dry.     Capillary Refill: Capillary refill takes less than 2 seconds.  Neurological:     Mental Status: She is alert.  Psychiatric:        Mood and Affect: Mood normal.     ED Results / Procedures / Treatments   Labs (all labs ordered are listed, but only abnormal results are displayed) Labs Reviewed - No data to display  EKG None  Radiology No results found.  Procedures Procedures  {Document cardiac monitor, telemetry assessment procedure when appropriate:1}  Medications Ordered in ED Medications - No data to display  ED Course/ Medical Decision Making/ A&P  Medical Decision Making Amount and/or Complexity of Data Reviewed Labs: ordered. Radiology: ordered.  Risk Prescription drug management. Decision regarding hospitalization.     {Document critical care time when appropriate:1} {Document review of labs and clinical decision tools ie heart score, Chads2Vasc2 etc:1}  {Document your independent review of radiology images, and any outside records:1} {Document your discussion with family members, caretakers, and with consultants:1} {Document social determinants of health affecting pt's care:1} {Document your decision making why or why not admission, treatments were needed:1} Final Clinical Impression(s) / ED Diagnoses Final diagnoses:  None    Rx / DC Orders ED Discharge Orders     None

## 2022-01-29 NOTE — Progress Notes (Signed)
Patient arrived by EMS on CPAP.  Patient removed from CPAP and placed on Bipap.  Patient is currently tolerating well at this time.  Will continue to monitor.

## 2022-01-29 NOTE — H&P (Signed)
History and Physical    Patient: Lindsey Pollard PIR:[id number] DOB: [birth year] DOA: 01/29/2022 DOS: the patient was seen and examined on 01/29/2022 PCP: Lauro Regulus, MD  Patient coming from: SNF rehab at [facility]; NOK: Daughter, Kevin Fenton, [phone]; nephew, Loleta Books, [phone]   Chief Complaint: SOB  HPI: Lindsey Pollard is a 81 y.o. female with medical history significant of recent Vfib arrest; chronic systolic CHF; CAD s/p CABG and PCI; stage 3 CKD; and DM who was admitted to [facility] from 7/20-8/1 for NSTEMI and multisystem organ failure including VDRF.  She was extubated on 7/25 and eventually discharged to SNF with palliative care.  Based on most recent GOC discussion, she would prefer to transition to hospice/comfort if further decline and would not want artificial feeds or rehospitalization.   She reports that she felt very SOB this AM.  Nothing seemed to trigger it.  No weight gain/edema.  No cough.  She was placed on BIPAP with improvement.  She would like to trial off the BIPAP but is willing to go back on if needed.  I was unable to reach family at the time of admission and left a message for her nephew.    ER Course:  Respiratory distress ,on BIPAP.  Recent vfib arrest.  BP 200s with EMS, improved with NTG.  Better on BIPAP. Pulm edema on CXR, BNP 1800.  VBG pending.       Review of Systems: As mentioned in the history of present illness. All other systems reviewed and are negative.  Limited by BIPAP.    Past Medical History:  Diagnosis Date   Chest pain 12/06/2021   Diabetes mellitus without complication (HCC)    Hyperlipidemia    Hypertension    Myocardial infarction [facility])    Past Surgical History:  Procedure Laterality Date   CARDIAC SURGERY     bypass   CORONARY/GRAFT ACUTE MI REVASCULARIZATION N/A 06/01/2021   Procedure: Coronary/Graft Acute MI Revascularization;  Surgeon: Alwyn Pea, MD;  Location: [facility] INVASIVE CV LAB;  Service:  Cardiovascular;  Laterality: N/A;   EYE SURGERY     cataract extraction   LEFT HEART CATH AND CORONARY ANGIOGRAPHY N/A 06/01/2021   Procedure: LEFT HEART CATH AND CORONARY ANGIOGRAPHY;  Surgeon: Alwyn Pea, MD;  Location: [facility] INVASIVE CV LAB;  Service: Cardiovascular;  Laterality: N/A;   Social History:  reports that she has quit smoking. Her smoking use included cigarettes. She has never used smokeless tobacco. She reports that she does not currently use alcohol. She reports that she does not currently use drugs.  Allergies  Allergen Reactions   Tetanus Toxoids Swelling    Tetanus and diphtheria toxids   Penicillin G Rash    Family History  Problem Relation Age of Onset   Congestive Heart Failure Mother    Diabetes Father    Breast cancer Neg Hx     Prior to Admission medications   Medication Sig Start Date End Date Taking? Authorizing Provider  acetaminophen (TYLENOL) 325 MG tablet Take 2 tablets (650 mg total) by mouth every 4 (four) hours as needed for mild pain (temp > 101.5). 01/23/22  Yes Sunnie Nielsen, DO  albuterol (VENTOLIN HFA) 108 (90 Base) MCG/ACT inhaler Inhale 1-2 puffs into the lungs every 4 (four) hours as needed for shortness of breath. 08/09/21  Yes [provider]  ALPRAZolam (XANAX) 0.25 MG tablet Take 0.5 tablets (0.125 mg total) by mouth 3 (three) times daily as needed for anxiety. 01/23/22  Yes Sunnie Nielsen, DO  aspirin 81 MG EC tablet Take 81 mg by mouth every evening.   Yes [provider]  atorvastatin (LIPITOR) 80 MG tablet Take 80 mg by mouth every evening. 07/06/20  Yes [provider]  Cholecalciferol 25 MCG (1000 UT) capsule Take 1,000 Units by mouth 2 (two) times daily.   Yes [provider]  clopidogrel (PLAVIX) 75 MG tablet Take 1 tablet (75 mg total) by mouth daily. Patient taking differently: Take 75 mg by mouth every evening. 06/04/21  Yes Wieting, Richard, MD  docusate sodium (COLACE) 100 MG  capsule Take 1 capsule (100 mg total) by mouth 2 (two) times daily as needed for mild constipation. 01/23/22  Yes Sunnie Nielsen, DO  feeding supplement (ENSURE ENLIVE / ENSURE PLUS) LIQD Take 237 mLs by mouth 3 (three) times daily between meals. 01/09/22  Yes Hollice Espy, MD  ipratropium-albuterol (DUONEB) 0.5-2.5 (3) MG/3ML SOLN Take 3 mLs by nebulization every 6 (six) hours as needed (shortness of breath). 08/29/21  Yes [provider]  isosorbide mononitrate (IMDUR) 60 MG 24 hr tablet Take 1 tablet (60 mg total) by mouth daily. 01/24/22  Yes Sunnie Nielsen, DO  lisinopril (ZESTRIL) 10 MG tablet Take 1 tablet (10 mg total) by mouth daily. 01/24/22  Yes Sunnie Nielsen, DO  loperamide (IMODIUM) 2 MG capsule Take 1 capsule (2 mg total) by mouth as needed for diarrhea or loose stools. Patient taking differently: Take 2 mg by mouth daily as needed for diarrhea or loose stools. 01/23/22  Yes Sunnie Nielsen, DO  metoprolol succinate (TOPROL-XL) 25 MG 24 hr tablet Take 1 tablet (25 mg total) by mouth daily. 01/24/22  Yes Sunnie Nielsen, DO  Multiple Vitamin (MULTIVITAMIN WITH MINERALS) TABS tablet Take 1 tablet by mouth every evening.   Yes [provider]  nitroGLYCERIN (NITROGLYN) 2 % ointment Apply 0.5 inches topically every 6 (six) hours as needed for chest pain. 01/23/22  Yes Sunnie Nielsen, DO  ondansetron (ZOFRAN-ODT) 4 MG disintegrating tablet Take 4 mg by mouth every 8 (eight) hours as needed for nausea or vomiting. 11/23/21  Yes [provider]  pantoprazole (PROTONIX) 40 MG tablet Take 1 tablet (40 mg total) by mouth daily. Patient taking differently: Take 40 mg by mouth every evening. 01/24/22  Yes Sunnie Nielsen, DO  polyethylene glycol (MIRALAX / GLYCOLAX) 17 g packet Take 17 g by mouth daily as needed for moderate constipation. 01/23/22  Yes Sunnie Nielsen, DO  spironolactone (ALDACTONE) 25 MG tablet Take 12.5 mg by mouth every morning. 09/05/21   Yes [provider]  torsemide (DEMADEX) 10 MG tablet Take 2 tablets (20 mg total) by mouth daily for 3 days, THEN 1 tablet (10 mg total) daily for 27 days. 01/23/22 02/22/22 Yes Sunnie Nielsen, DO  morphine 10 MG/5ML solution Take 2.5 mLs (5 mg total) by mouth every 2 (two) hours as needed (increased work of breathing, shortness of breath). Patient not taking: Reported on 01/29/2022 01/23/22   Barbara Cower, NP    Physical Exam: Vitals:   01/29/22 1610 01/29/22 1645 01/29/22 1800 01/29/22 1807  BP:  (!) 114/51 (!) 129/55   Pulse:  (!) 103 91   Resp:  (!) 33 13   Temp: 97.6 F (36.4 C)   97.8 F (36.6 C)  TempSrc: Axillary   Oral  SpO2:  97% 100%   Weight:      Height:       General:  Appears calm and comfortable and is  in NAD; chronically ill appearing, on BIPAP Eyes:   EOMI, normal lids, iris ENT:  grossly normal hearing, BIPAP in place Neck:  no LAD, masses or thyromegaly Cardiovascular:  RR with mild tachycardia, no m/r/g. No LE edema.  Respiratory:   Poor air movement despite BIPAP.  Mildly increased respiratory effort on BIPAP. Abdomen:  soft, NT, ND Skin:  no rash or induration seen on limited exam Musculoskeletal:  grossly normal tone BUE/BLE, good ROM, no bony abnormality Lower extremity:  No LE edema.  Limited foot exam with no ulcerations.  2+ distal pulses. Psychiatric:  blunted mood and affect, speech appropriate but limited by BIPAP Neurologic:  grossly normal but hard to evaluate given BIPAP-dependent respiratory failure   Radiological Exams on Admission: Independently reviewed - see discussion in A/P where applicable  DG Chest Portable 1 View  Result Date: 01/29/2022 CLINICAL DATA:  dyspnea EXAM: PORTABLE CHEST 1 VIEW COMPARISON:  January 22, 2022 FINDINGS: Again seen are sternotomy wires and CABG changes. Cardiomediastinal silhouette is prominent, stable. Bilateral pleural effusion greater on the left. There has been interval significant improvement of the  pulmonary edema. There is mild-to-moderate pulmonary vascular congestion seen. The visualized skeletal structures are unremarkable. IMPRESSION: 1. Mild-to-moderate bilateral pleural effusion greater on the left without significant interval change. 2. There has been significant resolution of the pulmonary edema. Presently, there is mild-to-moderate pulmonary vascular congestion seen. Electronically Signed   By: [name] M.D.   On: 01/29/2022 10:43    EKG: Independently reviewed.  Sinus tachycardia with rate 103; LBBB; nonspecific ST changes   Labs on Admission: I have personally reviewed the available labs and imaging studies at the time of the admission.  Pertinent labs:    Na++ 132 Glucose 217 BUN 55/Creatinine 1.50/GFR 35; 31/0.74/>60 on 7/31 ABG: 7.378/49/28.9 BNP 1815.2 HS troponin 30, 64 WBC 13.6 Hgb 9.1   Assessment and Plan: Principal Problem:   Acute on chronic systolic (congestive) heart failure (HCC) Active Problems:   AKI (acute kidney injury) ([place]) in the setting of stage IIIa chronic   CAD (coronary artery disease)   Cardiac arrest with successful resuscitation ([place])   Type II diabetes mellitus with renal manifestations ([place])   HTN (hypertension)   Mixed hyperlipidemia   Goals of care, counseling/discussion    Acute on chronic systolic CHF -Patient with known h/o chronic systolic CHF and recent multisystem organ failure with cardiac arrest presenting with worsening SOB and respiratory distress -CXR consistent with mild pulmonary edema -Markedly elevated BNP compared with prior -With elevated BNP and abnl CXR, acute decompensated CHF seems probable as diagnosis -Will admit to progressive care, as per the Emergency HF Mortality Risk Grade.  The patient has: severe pulmonary edema requiring new O2 therapy -Recent echo with EF 45-50%, global hypokinesis; will not repeat -Will continue ASA, Plavix, Imdur -CHF order set utilized -Was given Lasix 40 mg IV  BID -Continue spironolactone -Continue BIPAP/Mead O2 for now -Mildly elevated HS troponin is likely related to demand ischemia; doubt ACS based on symptoms  AKI on stage 3a CKD -Likely related to diminished renal perfusion in the setting of volume overload -Will follow BMP -Attempt to avoid nephrotoxic agents  CAD/recent arrest -Patient with recent vfib arrest -s/p CABG and PCI -Mildly elevated troponin but low suspicion for ACS, likely related to demand ischemia -Will continue to trend troponin   HTN -Hold lisinopril for now -Continue Toprol XL -Will also add prn hydralazine  HLD -Continue Lipitor  DM -Glucose 152 -Diet controlled -Last  A1c was 5.2 -There is no indication to start medication at this time, but will cover with SSI for now  Anxiety -Unable to tolerate PO meds while on BIPAP so will order prn IV Ativan as needed for anxiety  Goals of care -She was previously clear on her desire to transition to comfort and avoid rehospitalization should the need arise -She is less certain about this now -She does remain DNR -Will request palliative care consult      Advance Care Planning:   Code Status: DNR   Consults: CHF navigator; palliative care; PT/OT; TOC team; nutrition  DVT Prophylaxis: Lovenox  Family Communication: None present; I was unable to reach either her daughter or nephew by telephone at the time of admission  Severity of Illness: The appropriate patient status for this patient is INPATIENT. Inpatient status is judged to be reasonable and necessary in order to provide the required intensity of service to ensure the patient's safety. The patient's presenting symptoms, physical exam findings, and initial radiographic and laboratory data in the context of their chronic comorbidities is felt to place them at high risk for further clinical deterioration. Furthermore, it is not anticipated that the patient will be medically stable for discharge from the  hospital within 2 midnights of admission.   * I certify that at the point of admission it is my clinical judgment that the patient will require inpatient hospital care spanning beyond 2 midnights from the point of admission due to high intensity of service, high risk for further deterioration and high frequency of surveillance required.*  Author: Karmen Bongo, MD 01/29/2022 6:59 PM  For on call review [email or web address].

## 2022-01-29 NOTE — ED Triage Notes (Signed)
BIB EMS from [place] [facility] with onset of resp distress this am.  Recently discharged to [facility] after vfib arrest. Patient is tachypneic and diaphoretic.  Received albuterol 15mg , 125mg  solumedrol, 1 atrovent and 2 nitro.

## 2022-01-29 NOTE — ED Notes (Signed)
RT coming to trial patient off bipap and on [place]

## 2022-01-29 NOTE — ED Notes (Signed)
Notified phleb to draw repeat troponin

## 2022-01-29 NOTE — ED Notes (Signed)
Patient started complaining of difficulty breathing and became diaphoretic.  Patient placed back on bipap and Inpatient MD paged.

## 2022-01-29 NOTE — Progress Notes (Signed)
Pt found on NRB, pt denies SOB. Pt switched to HFNC 6L, Sp02 100%. Bipap on standby in pt's room if needed.

## 2022-01-29 NOTE — ED Notes (Signed)
Palliative care came at bedside and recommended morphine administration to make pt comfortable at this time. Pt is very sob. Continues to be on bipap. VS stable a this time. Will continue to monitor.

## 2022-01-29 NOTE — Progress Notes (Signed)
Pt. Troponin 207. On call for [facility] paged to make aware.

## 2022-01-29 NOTE — Plan of Care (Signed)
  Problem: Respiratory: Goal: Ability to maintain a clear airway and adequate ventilation will improve Outcome: Progressing   

## 2022-01-29 NOTE — Consult Note (Signed)
Consultation Note Date: 01/29/2022   Patient Name: Lindsey Pollard  DOB: 09-08-1940  MRN: [id number]  Age / Sex: 81 y.o., female  PCP: [name], MD Referring Physician: Karmen Bongo, MD  Reason for Consultation: Establishing goals of care  HPI/Patient Profile: 81 y.o. female  with past medical history of  recent Vfib arrest, chronic systolic CHF, CAD s/p CABG and PCI, stage 3 CKD, and DM presented to ED on 01/29/22 from [facility] with respiratory distress. On arrival to ED she was placed on BiPap. Of note, patient was recently hospitalized from 7/20-01/23/22 for STEMI and multisystem organ failure including VDRF. She was extubated on 01/16/22 and eventually discharged to SNF with palliative care.  Patient was admitted on 01/29/2022 with acute on chronic systolic CHF, AKI on stage 3a CKD, CAD/recent arrest.   Patient was seen by PMT during previous hospitalization where she indicated during MOST form completion for DNR, comfort measures, determine use or limitation of antibiotics if infection occurs, IVF trial period, no feeding tube.  Patient has had 3 admissions and 1 ED visit in the last 6 months; she is a 7 day readmission.  Clinical Assessment and Goals of Care: I have reviewed medical records including EPIC notes, labs, and imaging. Received report from primary RN - no acute concerns. RN questions DNR code status as MOST form at bedside indicates wish for CPR. RN reports bipap wean was attempted; however, patient only tolerated for about 1-1.5 hours off before declining again and requiring it to be replaced.  Went to visit patient at bedside - no family/visitors present. Patient was lying in bed awake, alert, oriented, and able to participate in simple conversation due to bipap in use. Non-verbal gestures of discomfort noted. No respiratory distress, increased work of breathing, or secretions noted with  bipap use. Patient endorses feeling very anxious and requests "help me relax."   MOST form noted at bedside that was completed 01/23/22 - likely completed at [facility] which reflects: CPR, Limited additional interventions, determine use or limitations of antibiotics if infection occurs, IVF trial period, no feeding tube. These goals are different than ones noted per [place] with PMT on 01/23/22 prior to discharge from [facility].  Met with patient  to discuss diagnosis, prognosis, GOC, EOL wishes, disposition, and options.   I re-introduced Palliative Medicine as specialized medical care for people living with serious illness. It focuses on providing relief from the symptoms and stress of a serious illness. The goal is to improve quality of life for both the patient and the family.  I readdressed code status to clarify her wishes from MOST for discrepancy - patient is clear her wish is for DNR/DNI. She would rather have a peaceful passing and states "do not put a tube down my throat."  Attempted to discuss her thoughts/feelings between aggressive medical intervention and comfort/hospice care. Patient tells me to "talk to my daughter." I offered to call her nephew/HCPOA and she requests again I speak with her daughter.   3:34 PM Attempted to call nephew/HCPOA/Douglas to discuss diagnosis, prognosis, GOC, EOL wishes, disposition, and options - no answer - confidential voicemail left and PMT phone number provided with request to return call.  3:35 PM Called patient's daughter/Lindsey Pollard to discuss diagnosis, prognosis, GOC, EOL wishes, disposition, and options.   Emotional support provided to [facility]. We discussed patient's interval history since discharge from previous hospitalization. Yesterday, patient was able to take a couple of steps out of her wheelchair to toilet and shower. Lindsey Pollard  describes her mother as very independent. 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  Lindsey Pollard tells me the patient's overall goal was to get stronger to return to [place] to be with her, then enroll in hospice. Gently reviewed with Lindsey Pollard that patient is likely not going to regain enough strength/be stable to travel such a long distance - she agrees, but wish's patient would accept this. Lindsey Pollard and the patient have had several discussions on her goals of care - patient told her "I want to be comfortable." Lindsey Pollard states, "I don't want to lose her, but I don't want her to suffer either." They have had discussions on hospice, but patient does not want to enroll until she can get to [place]. Lindsey Pollard is respectful of whatever the patient's wishes are. She tells me "it's hard to see her like this."  We discussed patient's current illness and what it means in the larger context of patient's on-going co-morbidities. Natural disease trajectory and expectations at EOL were discussed. I attempted to elicit values and goals of care important to the patient. The difference between aggressive medical intervention and comfort care was considered in light of the patient's goals of care.   Advance directives, concepts specific to code status, artificial feeding and hydration, and rehospitalization were considered and discussed. Lindsey Pollard confirms patient's nephew is her 40 - she tells me he works night shift and sleeps during the day. He is also located in [place], not local.  Questions and concerns were addressed. Lindsey Pollard was encouraged to call with questions and/or concerns. PMT number was provided.  Went back to speak with patient. Updated her on conversation had with daughter/Lindsey Pollard. Patient was appreciative I was able to speak with her. Again attempted to discuss her thoughts/feelings between aggressive medical intervention and comfort/hospice care. Patient tells me, "I want to be with my daughter." I reviewed with her that it is unlikely  she will get well enough to travel that far to be with her. Patient tells me she wants to try. Her goal is to continue current interventions, she is ok with bipap for now and continuing [place] tomorrow pending her clinical course. In the event of a further respiratory decline she is ok with transition to full comfort measures over intubation.   Discussed with patient/family the importance of continued conversation with each other and the medical providers regarding overall plan of care and treatment options, ensuring decisions are within the context of the patient's values and GOCs.    Questions and concerns were addressed. The patient/family was encouraged to call with questions and/or concerns.    Primary Decision Maker: Patient at this time HCPOA is nephew/Douglas Benny Lennert - works nights, sleeps during the day    SUMMARY OF RECOMMENDATIONS   Continue current medical treatment, patient ok with Bipap Continue DNR/DNI  In the event of further respiratory decline, patient would prefer transition to full comfort care over intubation and daughter supports this Patient's overall goal is to get well enough to move to [place] to be with her daughter, then enroll in hospice. Unfortunately, this is unlikely to happen - daughter agrees, patient has not yet accepted this Patient unable to take Xanax while on bipap, started ativan IV 0.25m q4h PRN anxiety Indication for morphine adjusted to include dyspnea Unable to speak with HCPOA/nephew/[name] - was told he works nights and sleeps during the day PMT will continue  to follow and support holistically   Code Status/Advance Care Planning: DNR  Palliative Prophylaxis:  Aspiration, Bowel Regimen, Delirium Protocol, Frequent Pain Assessment, Oral Care, and Turn Reposition  Additional Recommendations (Limitations, Scope, Preferences): Full Scope Treatment, No Artificial Feeding, and No Tracheostomy  Psycho-social/Spiritual:  Desire for further Chaplaincy  support:no Created space and opportunity for patient and family to express thoughts and feelings regarding patient's current medical situation.  Emotional support and therapeutic listening provided.  Prognosis:  Unable to determine  Discharge Planning: To Be Determined      Primary Diagnoses: Present on Admission:  Acute on chronic systolic (congestive) heart failure ([place])   I have reviewed the medical record, interviewed the patient and family, and examined the patient. The following aspects are pertinent.  Past Medical History:  Diagnosis Date   Chest pain 12/06/2021   Diabetes mellitus without complication (HCC)    Hyperlipidemia    Hypertension    Myocardial infarction [facility])    Social History   Socioeconomic History   Marital status: Divorced    Spouse name: Not on file   Number of children: Not on file   Years of education: Not on file   Highest education level: Not on file  Occupational History   Not on file  Tobacco Use   Smoking status: Former    Types: Cigarettes   Smokeless tobacco: Never  Substance and Sexual Activity   Alcohol use: Not Currently   Drug use: Not Currently   Sexual activity: Not on file  Other Topics Concern   Not on file  Social History Narrative   Not on file   Social Determinants of Health   Financial Resource Strain: Not on file  Food Insecurity: Not on file  Transportation Needs: Not on file  Physical Activity: Not on file  Stress: Not on file  Social Connections: Not on file   Family History  Problem Relation Age of Onset   Congestive Heart Failure Mother    Diabetes Father    Breast cancer Neg Hx    Scheduled Meds:  aspirin EC  81 mg Oral QPM   atorvastatin  80 mg Oral QPM   clopidogrel  75 mg Oral QPM   docusate sodium  100 mg Oral BID   enoxaparin (LOVENOX) injection  30 mg Subcutaneous Q24H   furosemide  40 mg Intravenous BID   [START ON 01/30/2022] isosorbide mononitrate  60 mg Oral Daily   [START ON 01/30/2022]  metoprolol succinate  25 mg Oral Daily   pantoprazole  40 mg Oral QPM   sodium chloride flush  3 mL Intravenous Q12H   [START ON 01/30/2022] spironolactone  12.5 mg Oral q morning   Continuous Infusions: PRN Meds:.acetaminophen **OR** acetaminophen, albuterol, ALPRAZolam, bisacodyl, hydrALAZINE, ipratropium-albuterol, morphine injection, ondansetron **OR** ondansetron (ZOFRAN) IV, oxyCODONE, polyethylene glycol, traZODone Medications Prior to Admission:  Prior to Admission medications   Medication Sig Start Date End Date Taking? Authorizing Provider  acetaminophen (TYLENOL) 325 MG tablet Take 2 tablets (650 mg total) by mouth every 4 (four) hours as needed for mild pain (temp > 101.5). 01/23/22  Yes Emeterio Reeve, DO  albuterol (VENTOLIN HFA) 108 (90 Base) MCG/ACT inhaler Inhale 1-2 puffs into the lungs every 4 (four) hours as needed for shortness of breath. 08/09/21  Yes [provider]  ALPRAZolam (XANAX) 0.25 MG tablet Take 0.5 tablets (0.125 mg total) by mouth 3 (three) times daily as needed for anxiety. 01/23/22  Yes Emeterio Reeve, DO  aspirin 81 MG EC  tablet Take 81 mg by mouth every evening.   Yes [provider]  atorvastatin (LIPITOR) 80 MG tablet Take 80 mg by mouth every evening. 07/06/20  Yes [provider]  Cholecalciferol 25 MCG (1000 UT) capsule Take 1,000 Units by mouth 2 (two) times daily.   Yes [provider]  clopidogrel (PLAVIX) 75 MG tablet Take 1 tablet (75 mg total) by mouth daily. Patient taking differently: Take 75 mg by mouth every evening. 06/04/21  Yes Wieting, Richard, MD  docusate sodium (COLACE) 100 MG capsule Take 1 capsule (100 mg total) by mouth 2 (two) times daily as needed for mild constipation. 01/23/22  Yes Emeterio Reeve, DO  feeding supplement (ENSURE ENLIVE / ENSURE PLUS) LIQD Take 237 mLs by mouth 3 (three) times daily between meals. 01/09/22  Yes Annita Brod, MD  ipratropium-albuterol (DUONEB) 0.5-2.5 (3)  MG/3ML SOLN Take 3 mLs by nebulization every 6 (six) hours as needed (shortness of breath). 08/29/21  Yes [provider]  isosorbide mononitrate (IMDUR) 60 MG 24 hr tablet Take 1 tablet (60 mg total) by mouth daily. 01/24/22  Yes Emeterio Reeve, DO  lisinopril (ZESTRIL) 10 MG tablet Take 1 tablet (10 mg total) by mouth daily. 01/24/22  Yes Emeterio Reeve, DO  loperamide (IMODIUM) 2 MG capsule Take 1 capsule (2 mg total) by mouth as needed for diarrhea or loose stools. Patient taking differently: Take 2 mg by mouth daily as needed for diarrhea or loose stools. 01/23/22  Yes Emeterio Reeve, DO  metoprolol succinate (TOPROL-XL) 25 MG 24 hr tablet Take 1 tablet (25 mg total) by mouth daily. 01/24/22  Yes Emeterio Reeve, DO  Multiple Vitamin (MULTIVITAMIN WITH MINERALS) TABS tablet Take 1 tablet by mouth every evening.   Yes [provider]  nitroGLYCERIN (NITROGLYN) 2 % ointment Apply 0.5 inches topically every 6 (six) hours as needed for chest pain. 01/23/22  Yes Emeterio Reeve, DO  ondansetron (ZOFRAN-ODT) 4 MG disintegrating tablet Take 4 mg by mouth every 8 (eight) hours as needed for nausea or vomiting. 11/23/21  Yes [provider]  pantoprazole (PROTONIX) 40 MG tablet Take 1 tablet (40 mg total) by mouth daily. Patient taking differently: Take 40 mg by mouth every evening. 01/24/22  Yes Emeterio Reeve, DO  polyethylene glycol (MIRALAX / GLYCOLAX) 17 g packet Take 17 g by mouth daily as needed for moderate constipation. 01/23/22  Yes Emeterio Reeve, DO  spironolactone (ALDACTONE) 25 MG tablet Take 12.5 mg by mouth every morning. 09/05/21  Yes [provider]  torsemide (DEMADEX) 10 MG tablet Take 2 tablets (20 mg total) by mouth daily for 3 days, THEN 1 tablet (10 mg total) daily for 27 days. 01/23/22 02/22/22 Yes Emeterio Reeve, DO   Allergies  Allergen Reactions   Tetanus Toxoids Swelling    Tetanus and diphtheria toxids   Penicillin G Rash    Review of Systems  Unable to perform ROS: Acuity of condition    Physical Exam Vitals and nursing note reviewed.  Constitutional:      General: She is not in acute distress.    Appearance: She is ill-appearing.  Pulmonary:     Effort: No respiratory distress.     Comments: Bipap in use Skin:    General: Skin is warm and dry.  Neurological:     Mental Status: She is alert and oriented to person, place, and time.     Motor: Weakness present.  Psychiatric:        Attention and Perception:  Attention normal.        Behavior: Behavior is cooperative.        Cognition and Memory: Cognition and memory normal.     Vital Signs: BP (!) 157/63 Comment: 4L [place]  Pulse 86 Comment: 4L [place]  Temp (!) 97.4 F (36.3 C) (Oral)   Resp (!) 31 Comment: 4L [place]  Ht 4' 11"  (1.499 m)   Wt 56.7 kg   SpO2 97% Comment: 4L [place]  BMI 25.25 kg/m  Pain Scale: 0-10   Pain Score: 0-No pain   SpO2: SpO2: 97 % (4L [place]) O2 Device:SpO2: 97 % (4L [place]) O2 Flow Rate: .O2 Flow Rate (L/min): 4 L/min  IO: Intake/output summary:  Intake/Output Summary (Last 24 hours) at 01/29/2022 1502 Last data filed at 01/29/2022 1122 Gross per 24 hour  Intake 50 ml  Output --  Net 50 ml    LBM:   Baseline Weight: Weight: 56.7 kg Most recent weight: Weight: 56.7 kg     Palliative Assessment/Data: PPS 30%     Time In: 1430 Time Out: 1600 Time Total: 90 minutes  Greater than 50%  of this time was spent counseling and coordinating care related to the above assessment and plan.  Signed by: Lin Landsman, NP   Please contact Palliative Medicine Team phone at [phone] for questions and concerns.  For individual provider: See Amion  *Portions of this note are a verbal dictation therefore any spelling and/or grammatical errors are due to the "[employer]" system interpretation.

## 2022-01-29 NOTE — ED Notes (Signed)
Hydralazine IVP given for bp of 175/80. Will continue to monitor.

## 2022-01-29 NOTE — Progress Notes (Signed)
Patient taken off of bipap and placed on 4L [place].  Currently tolerating well.  Will continue to monitor.  

## 2022-01-30 DIAGNOSIS — R0603 Acute respiratory distress: Secondary | ICD-10-CM

## 2022-01-30 DIAGNOSIS — I5023 Acute on chronic systolic (congestive) heart failure: Secondary | ICD-10-CM | POA: Diagnosis not present

## 2022-01-30 DIAGNOSIS — N179 Acute kidney failure, unspecified: Secondary | ICD-10-CM | POA: Diagnosis not present

## 2022-01-30 DIAGNOSIS — I25118 Atherosclerotic heart disease of native coronary artery with other forms of angina pectoris: Secondary | ICD-10-CM | POA: Diagnosis not present

## 2022-01-30 DIAGNOSIS — Z711 Person with feared health complaint in whom no diagnosis is made: Secondary | ICD-10-CM

## 2022-01-30 LAB — CBC
HCT: 29.6 % — ABNORMAL LOW (ref 36.0–46.0)
Hemoglobin: 9.7 g/dL — ABNORMAL LOW (ref 12.0–15.0)
MCH: 30 pg (ref 26.0–34.0)
MCHC: 32.8 g/dL (ref 30.0–36.0)
MCV: 91.6 fL (ref 80.0–100.0)
Platelets: 292 10*3/uL (ref 150–400)
RBC: 3.23 MIL/uL — ABNORMAL LOW (ref 3.87–5.11)
RDW: 14.6 % (ref 11.5–15.5)
WBC: 15.1 10*3/uL — ABNORMAL HIGH (ref 4.0–10.5)
nRBC: 0 % (ref 0.0–0.2)

## 2022-01-30 LAB — BASIC METABOLIC PANEL
Anion gap: 14 (ref 5–15)
Anion gap: 11 (ref 5–15)
BUN: 68 mg/dL — ABNORMAL HIGH (ref 8–23)
BUN: 62 mg/dL — ABNORMAL HIGH (ref 8–23)
CO2: 23 mmol/L (ref 22–32)
CO2: 28 mmol/L (ref 22–32)
Calcium: 8.6 mg/dL — ABNORMAL LOW (ref 8.9–10.3)
Calcium: 8.6 mg/dL — ABNORMAL LOW (ref 8.9–10.3)
Chloride: 95 mmol/L — ABNORMAL LOW (ref 98–111)
Chloride: 93 mmol/L — ABNORMAL LOW (ref 98–111)
Creatinine, Ser: 1.53 mg/dL — ABNORMAL HIGH (ref 0.44–1.00)
Creatinine, Ser: 1.48 mg/dL — ABNORMAL HIGH (ref 0.44–1.00)
GFR, Estimated: 34 mL/min — ABNORMAL LOW (ref >60)
GFR, Estimated: 36 mL/min — ABNORMAL LOW (ref >60)
Glucose, Bld: 135 mg/dL — ABNORMAL HIGH (ref 70–99)
Glucose, Bld: 126 mg/dL — ABNORMAL HIGH (ref 70–99)
Potassium: 7 mmol/L (ref 3.5–5.1)
Potassium: 5.6 mmol/L — ABNORMAL HIGH (ref 3.5–5.1)
Sodium: 132 mmol/L — ABNORMAL LOW (ref 135–145)
Sodium: 132 mmol/L — ABNORMAL LOW (ref 135–145)

## 2022-01-30 LAB — GLUCOSE, CAPILLARY: Glucose-Capillary: 126 mg/dL — ABNORMAL HIGH (ref 70–99)

## 2022-01-30 MED ORDER — ENSURE ENLIVE PO LIQD
237.0000 mL | Freq: Two times a day (BID) | ORAL | Status: DC
  Administered 2022-01-30 – 2022-02-03 (×8): 237 mL via ORAL

## 2022-01-30 NOTE — Progress Notes (Signed)
Daily Progress Note   Patient Name: Lindsey Pollard       Date: 01/30/2022 DOB: 16-Jun-1941  Age: 81 y.o. MRN#: [id number] Attending Physician: Zannie Cove, MD Primary Care Physician: Lauro Regulus, MD Admit Date: 01/29/2022  Reason for Consultation/Follow-up: Establishing goals of care  Subjective: Chart review performed. Received report from primary RN - no acute concerns. RN has been able to wean patient to 2L O2 [name]. Discussed patient's performance with PT- they report she is very weak.  Went to visit patient at bedside - no family/visitors present. Patient had just finished working with PT - is up in chair. No signs or non-verbal gestures of pain or discomfort noted. No respiratory distress, increased work of breathing, or secretions noted. Patient reports feeling "tired" and a "little short of breath." She is on 2L O2 [name].  Emotional support provided to patient. She states that working with PT went "ok." When asked if she felt rehab at [facility] has been beneficial she reports no - she is not really interested in continuing rehab. Discussed that the goal of rehab is improvement/stabalization of functional status,  which can be a difficult goal to meet for patients with advanced illness and multiple medical conditions. Patient's ultimate goal is to get to [place] to be with her daughter. We discussed being in LTC close to her daughter in [place]; however, it would take time to find a facility. Patient is agreeable to return to [facility] for rehab if it's a short time. She requests I call and speak with her daughter Lindsey Pollard to discuss further details.  Discussed option of hospice - patient confirms she is not interested in hospice at this time while trying to get to [place].  All questions and concerns  addressed.   Called Lindsey Pollard - emotional support provided. Provided medical updates from my, RN, and PT assessment today. Continued discussion around patient's goal to get to [place] - Lindsey Pollard is supportive of this but reiterates she would need to find a LTC facility as she is not able to care for patient in her home. Reviewed patient's insurance in context of LTC option to the best of my ability. Lindsey Pollard has been in contact with HCPOA/nephew/[name] about facilities - he is supportive of finding one in [place]. TOC to speak with Lindsey Pollard about cost of non-emergent transport.  She and patient also understand that a transport that long is risky due to her frailty and high risk for decline. After discussion, plan is for patient to return to [facility] for rehab while Lindsey Pollard works on finding LTC facility close to her home. Goal is for patient to minimize time in rehab. Lindsey Pollard is hopeful TOC can assist her in finding facility/understand her next steps.   Notified TOC of patient/daughters requests as above. Notified [facility] liaison of request for outpatient PC follow up sooner than later due to high risk of recurrent respiratory failure/decline.  All questions and concerns addressed. Encouraged to call with questions and/or concerns. PMT number previously provided.  Length of Stay: 1  Current Medications: Scheduled Meds:   aspirin EC  81 mg Oral QPM   atorvastatin  80 mg Oral QPM   clopidogrel  75 mg Oral QPM   docusate sodium  100 mg Oral BID   enoxaparin (LOVENOX) injection  30 mg Subcutaneous Q24H   feeding supplement  237 mL Oral BID BM   furosemide  40 mg Intravenous BID   isosorbide mononitrate  60 mg Oral Daily   metoprolol succinate  25 mg Oral Daily   pantoprazole  40 mg Oral QPM   sodium chloride flush  3 mL Intravenous Q12H   spironolactone  12.5 mg Oral q morning    Continuous Infusions:   PRN Meds: acetaminophen **OR** acetaminophen, albuterol, ALPRAZolam, bisacodyl, hydrALAZINE,  ipratropium-albuterol, LORazepam, morphine injection, ondansetron **OR** ondansetron (ZOFRAN) IV, oxyCODONE, polyethylene glycol, traZODone  Physical Exam Vitals and nursing note reviewed.  Constitutional:      General: She is not in acute distress.    Appearance: She is ill-appearing.  Pulmonary:     Effort: No respiratory distress.  Skin:    General: Skin is warm and dry.  Neurological:     Mental Status: She is alert and oriented to person, place, and time.     Motor: Weakness present.  Psychiatric:        Attention and Perception: Attention normal.        Behavior: Behavior is cooperative.        Cognition and Memory: Cognition and memory normal.             Vital Signs: BP (!) 106/35   Pulse 90   Temp 97.9 F (36.6 C) (Oral)   Resp 20   Ht 4\' 11"  (1.499 m)   Wt 59.7 kg   SpO2 99%   BMI 26.58 kg/m  SpO2: SpO2: 99 % O2 Device: O2 Device: Nasal Cannula O2 Flow Rate: O2 Flow Rate (L/min): 2 L/min  Intake/output summary:  Intake/Output Summary (Last 24 hours) at 01/30/2022 1129 Last data filed at 01/30/2022 0818 Gross per 24 hour  Intake 480 ml  Output 460 ml  Net 20 ml   LBM: Last BM Date :  (pta) Baseline Weight: Weight: 56.7 kg Most recent weight: Weight: 59.7 kg       Palliative Assessment/Data: PPS 40%      Patient Active Problem List   Diagnosis Date Noted   Acute on chronic systolic (congestive) heart failure (HCC) 01/29/2022   Goals of care, counseling/discussion 01/20/2022   Fever and chills 01/17/2022   Malnutrition of moderate degree 01/11/2022   Overweight (BMI 25.0-29.9) 01/06/2022   Cardiac arrest with successful resuscitation (HCC) 01/03/2022   Unstable angina (HCC) 12/07/2021   NSTEMI (non-ST elevated myocardial infarction) (HCC) 12/06/2021   Chronic kidney disease, stage 3a (HCC) 12/06/2021   Lung  nodule 12/06/2021   Type II diabetes mellitus with renal manifestations (HCC) 12/06/2021   HTN (hypertension) 12/06/2021   HLD  (hyperlipidemia) 12/06/2021   AKI (acute kidney injury) (HCC) in the setting of stage IIIa chronic    Anemia    Acute respiratory failure with hypoxia (HCC)    Chronic diastolic CHF (congestive heart failure) (HCC)    Fungal infection of the groin    Fungal infection of skin    Essential hypertension 06/01/2021   Carotid stenosis 10/26/2020   Atherosclerosis of native arteries of extremity with intermittent claudication (HCC) 09/16/2020   Carotid bruit present 09/16/2020   Healthcare maintenance 01/06/2019   Aortic calcification (HCC) 06/19/2017   Ischemic cardiomyopathy 06/19/2017   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 female  with past medical history of  recent Vfib arrest, chronic systolic CHF, CAD s/p CABG and PCI, stage 3 CKD, and DM presented to ED on 01/29/22 from [facility] with respiratory distress. On arrival to ED she was placed on BiPap. Of note, patient was recently hospitalized from 7/20-01/23/22 for STEMI and multisystem organ failure including VDRF. She was extubated on 01/16/22 and eventually discharged to SNF with palliative care.  Patient was admitted on 01/29/2022 with acute on chronic systolic CHF, AKI on stage 3a CKD, CAD/recent arrest.    Patient was seen by PMT during previous hospitalization where she indicated during MOST form completion for DNR, comfort measures, determine use or limitation of antibiotics if infection occurs, IVF trial period, no feeding tube.   Patient has had 3 admissions and 1 ED visit in the last 6 months; she is a 7 day  readmission.  Assessment: Principal Problem:   Acute on chronic systolic (congestive) heart failure (HCC) Active Problems:   CAD (coronary artery disease)   Mixed hyperlipidemia   AKI (acute kidney injury) (HCC) in the setting of stage IIIa chronic   Type II diabetes mellitus with renal manifestations (HCC)   HTN (hypertension)   Cardiac arrest with successful resuscitation (HCC)   Goals of care, counseling/discussion   Recommendations/Plan: Continue current medical treatment Continue DNR/DNI as previously documented - durable DNR form completed and placed in shadow chart. Copy was made and will be scanned into Vynca/ACP tab Daughter is looking for LTC facility in [place] for patient. In the interim, plan is for patient return to rehab with goal to minimize time there. Patient/family hope she can transfer to LTC in [place] as soon as possible then enroll in hospice. Patient/family understand transport that long is risky in context of her frailty and high risk for decline Patient remains hospice appropriate if/when agreeable TOC notified and consulted for: daughter's questions around cost of transport and assistance with finding LTC facilities Requested [facility] outpatient Palliative Care follow up with patient after discharge sooner than later PMT will continue to follow and support holistically  Goals of Care and Additional Recommendations: Limitations on Scope of Treatment: Full Scope Treatment and No Tracheostomy  Code Status:    Code Status Orders  (From admission, onward)           Start  Ordered   01/29/22 1259  Do not attempt resuscitation (DNR)  Continuous       Question Answer Comment  In the event of cardiac or respiratory ARREST Do not call a "code blue"   In the event of cardiac or respiratory ARREST Do not perform Intubation, CPR, defibrillation or ACLS   In the event of cardiac or respiratory ARREST Use medication by any route, position, wound care, and other  measures to relive pain and suffering. May use oxygen, suction and manual treatment of airway obstruction as needed for comfort.      01/29/22 1259           Code Status History     Date Active Date Inactive Code Status Order ID Comments User Context   01/11/2022 1720 01/23/2022 2120 DNR [id number]  Flora Lipps, MD Inpatient   01/11/2022 [phone] 01/11/2022 1720 Full Code [id number]  Bradly Bienenstock, NP ED   01/03/2022 2214 01/09/2022 2112 Full Code [id number]  Lang Snow, NP ED   12/06/2021 2139 12/08/2021 2206 Partial Code [id number]  Jone Baseman, NP ED   12/06/2021 1034 12/06/2021 2139 Full Code [id number]  Ivor Costa, MD ED   06/01/2021 0917 06/03/2021 2015 DNR [id number]  Collier Bullock, MD Inpatient   06/01/2021 0831 06/01/2021 0917 Full Code [id number]  Yolonda Kida, MD Inpatient      Advance Directive Documentation    Flowsheet Row Most Recent Value  Type of Advance Directive Out of facility DNR (pink MOST or yellow form)  Pre-existing out of facility DNR order (yellow form or pink MOST form) --  "MOST" Form in Place? --       Prognosis:  Unable to determine  Discharge Planning: [facility] for rehab with Palliative care service follow-up  Care plan was discussed with primary RN, patient, patient's daughter, Dr. Broadus John, [facility], [facility] liaison  Thank you for allowing the Palliative Medicine Team to assist in the care of this patient.   Total Time 60 minutes Prolonged Time Billed  no       Greater than 50%  of this time was spent counseling and coordinating care related to the above assessment and plan.  Lin Landsman, NP  Please contact Palliative Medicine Team phone at [phone] for questions and concerns.    *Portions of this note are a verbal dictation therefore any spelling and/or grammatical errors are due to the "[place] [employer]" system interpretation.

## 2022-01-30 NOTE — Progress Notes (Signed)
Lab called to notify of pt's critical potassium level of 7.0. Lab states blood was hemolyzed. State they will redraw potassium level to confirm level.

## 2022-01-30 NOTE — Progress Notes (Signed)
Initial Nutrition Assessment  DOCUMENTATION CODES:   Non-severe (moderate) malnutrition in context of chronic illness  INTERVENTION:  Liberalize diet from a heart healthy to a regular diet to provide widest variety of menu options to enhance nutritional adequacy Ensure Enlive po BID, each supplement provides 350 kcal and 20 grams of protein. MVI with minerals daily  NUTRITION DIAGNOSIS:   Moderate Malnutrition related to chronic illness (chronic CHF, CAD) as evidenced by mild fat depletion, moderate muscle depletion, mild muscle depletion.  GOAL:   Patient will meet greater than or equal to 90% of their needs  MONITOR:   PO intake, Supplement acceptance, Diet advancement, Labs, Weight trends  REASON FOR ASSESSMENT:   Consult Other (Comment) (nutrition goals)  ASSESSMENT:   Pt admitted from [facility] (SNF) with SOB r/t acute on chronic CHF. PMH significant for recent vfib arrest, chronic systolic CHF, CAD s/p CABG and PCI, CKD stage III, recent admit to [facility] 7/20-8/1 for NSTEMI and multisystem organ failure including VDRF  PMT following for GOC. Pt wishes to continue with current interventions at this time while trying to arrange for transportation and hopeful LTC placement in [place] close to her daughter.   Pt reports that she has always been a light eater. Prior to her most recent admission, she states that she did not typically eat a large meal but rather would snack throughout the day. Since her most recent admission and her recent stay at [facility], she endorses eating very minimally which she relates to early satiety and difficulty breathing. Continues with very minimal PO intake during admission. Through review of Palliative Care notes, pt declines artificial nutrition. Will attempt to optimize nutritional intake through diet liberalization and nutrition supplements  Meal completions:  8/8: 5%-breakfast, 5%- lunch  Pt is uncertain of her usual wt and whether or  not she has had recent wt loss. Per review of wt history, she has had a 9.8% wt loss since December, which is concerning but not clinically significant for time frame. Her more recent wt within the last month has noted to increase which could likely be attributed to acute exacerbation of CHF.    Edema: non-pitting BLE  Medications: colace, lasix, protonix  Labs: sodium 130, BUN 55, Cr 1.50, ionized Ca 1.14, Mg 2.7, GFR 35  NUTRITION - FOCUSED PHYSICAL EXAM:  Flowsheet Row Most Recent Value  Orbital Region Mild depletion  Upper Arm Region Mild depletion  Thoracic and Lumbar Region No depletion  Buccal Region No depletion  Temple Region Mild depletion  Clavicle Bone Region Mild depletion  Clavicle and Acromion Bone Region Moderate depletion  Scapular Bone Region Mild depletion  Dorsal Hand No depletion  Patellar Region Moderate depletion  Anterior Thigh Region Moderate depletion  Posterior Calf Region Moderate depletion  Edema (RD Assessment) None  Hair Reviewed  Eyes Reviewed  Mouth Reviewed  Skin Reviewed  Nails Reviewed      Diet Order:   Diet Order             Diet Heart Room service appropriate? Yes; Fluid consistency: Thin; Fluid restriction: 1500 mL Fluid  Diet effective ____                   EDUCATION NEEDS:   No education needs have been identified at this time  Skin:  Skin Assessment: Skin Integrity Issues: Skin Integrity Issues:: Stage II Stage II: mid/L sacrum  Last BM:  PTA  Height:   Ht Readings from Last 1 Encounters:  01/29/22 4\' 11"  (1.499 m)    Weight:   Wt Readings from Last 1 Encounters:  01/30/22 59.7 kg   BMI:  Body mass index is 26.58 kg/m.  Estimated Nutritional Needs:   Kcal:  1500-1700  Protein:  80-95g  Fluid:  1.5L  04/01/22, RDN, LDN Clinical Nutrition

## 2022-01-30 NOTE — Progress Notes (Signed)
[facility] [id number] [facility]) Hospital Liaison note:  This is a pending outpatient-based Palliative Care patient. Will continue to follow for disposition.  Please call with any outpatient palliative questions or concerns.  Thank you, Abran Cantor, LPN [facility] Liaison [phone]

## 2022-01-30 NOTE — Progress Notes (Signed)
PROGRESS NOTE    Lindsey CHOJNOWSKI  UVO:[id number] DOB: 07-May-1941 DOA: 01/29/2022 PCP: Lauro Regulus, MD  81/F with history of recent V-fib arrest, chronic systolic CHF, CAD/CABG and PCI, stage IIIa CKD, type 2 diabetes mellitus was recently admitted to [facility] from 7/20-8/1 with V-fib arrest, NSTEMI and respiratory failure requiring resuscitation and mechanical ventilation, extubated 7/25, treated medically for NSTEMI, seen by palliative care and then discharged to SNF with discussion to transition to hospice for further decline.  Brought to [facility] ED 8/7 with acute onset dyspnea In the ED she was noted to be in respiratory distress, SBP in the 200s with EMS, chest x-ray with pulmonary edema, BNP 1800  Subjective: -Feels weak and tired, breathing is improving  Assessment and Plan:  Acute on chronic systolic CHF -Last echo 2 weeks ago with a EF of 45%, admitted with NSTEMI, cardiac arrest then, treated medically -Required BiPAP on admission, chest x-ray with mild pulmonary edema -Continue IV Lasix 40 Mg twice daily, Aldactone -Continue Imdur -PT OT eval  H/o CAD, CABG and PCI  -Recent NSTEMI and cardiac arrest, treated medically  -Continue aspirin Plavix and Imdur  -No symptoms of ACS at this time   AKI on stage 3a CKD -Likely cardiorenal, gentle diuresis  Hyperkalemia -Per labs specimen was hemolyzed, await repeat   HTN -Continue Toprol, hold lisinopril   DM -Diet controlled, last A1c was 5.2  Goals of care -Based on previous palliative care discussions 10 days ago she expressed desire to transition to comfort for further decline and to avoid rehospitalization -Now DNR, wishes to continue medical treatment -Appreciate palliative care input, she would be most appropriate for hospice services at discharge   DVT prophylaxis: Lovenox Code Status: DNR Family Communication: Discussed with patient detail no family at bedside Disposition Plan: Back to SNF  Consultants:  Palliative care   Procedures:   Antimicrobials:    Objective: Vitals:   01/30/22 0723 01/30/22 0800 01/30/22 0950 01/30/22 1042  BP: (!) 99/43  (!) 109/54 (!) 106/35  Pulse: 92  94 90  Resp: 19  (!) 22 20  Temp: 98.6 F (37 C)   97.9 F (36.6 C)  TempSrc: Oral   Oral  SpO2: 100% 99% 99% 99%  Weight:      Height:        Intake/Output Summary (Last 24 hours) at 01/30/2022 1147 Last data filed at 01/30/2022 0818 Gross per 24 hour  Intake 480 ml  Output 460 ml  Net 20 ml   Filed Weights   01/29/22 0957 01/30/22 0100  Weight: 56.7 kg 59.7 kg    Examination:  General exam: Elderly frail chronically ill female sitting up in bed, AAOx3 HEENT: Positive JVD CVS: S1-S2, regular rhythm Lungs: Few basilar rales Abdomen: Soft, nontender, bowel sounds present Extremities: No edema Skin: No rashes Psychiatry:  Mood & affect appropriate.     Data Reviewed:   CBC: Recent Labs  Lab 01/29/22 1003 01/29/22 1133 01/30/22 0903  WBC 13.6*  --  15.1*  NEUTROABS 10.0*  --   --   HGB 9.1* 8.5* 9.7*  HCT 29.2* 25.0* 29.6*  MCV 94.5  --  91.6  PLT 365  --  292   Basic Metabolic Panel: Recent Labs  Lab 01/29/22 1003 01/29/22 1133 01/30/22 0747  NA 132* 130* 132*  K 4.5 4.1 7.0*  CL 94*  --  95*  CO2 27  --  23  GLUCOSE 217*  --  135*  BUN 55*  --  68*  CREATININE 1.50*  --  1.53*  CALCIUM 8.8*  --  8.6*  MG 2.7*  --   --    GFR: Estimated Creatinine Clearance: 23.1 mL/min (A) (by C-G formula based on SCr of 1.53 mg/dL (H)). Liver Function Tests: Recent Labs  Lab 01/29/22 1003  AST 33  ALT 28  ALKPHOS 103  BILITOT 1.0  PROT 6.1*  ALBUMIN 3.3*   No results for input(s): "LIPASE", "AMYLASE" in the last 168 hours. No results for input(s): "AMMONIA" in the last 168 hours. Coagulation Profile: No results for input(s): "INR", "PROTIME" in the last 168 hours. Cardiac Enzymes: No results for input(s): "CKTOTAL", "CKMB", "CKMBINDEX", "TROPONINI" in the last 168  hours. BNP (last 3 results) No results for input(s): "PROBNP" in the last 8760 hours. HbA1C: No results for input(s): "HGBA1C" in the last 72 hours. CBG: Recent Labs  Lab 01/23/22 1200 01/29/22 1829 01/30/22 1039  GLUCAP 139* 251* 126*   Lipid Profile: No results for input(s): "CHOL", "HDL", "LDLCALC", "TRIG", "CHOLHDL", "LDLDIRECT" in the last 72 hours. Thyroid Function Tests: No results for input(s): "TSH", "T4TOTAL", "FREET4", "T3FREE", "THYROIDAB" in the last 72 hours. Anemia Panel: No results for input(s): "VITAMINB12", "FOLATE", "FERRITIN", "TIBC", "IRON", "RETICCTPCT" in the last 72 hours. Urine analysis:    Component Value Date/Time   COLORURINE STRAW (A) 01/17/2022 1832   APPEARANCEUR CLEAR (A) 01/17/2022 1832   LABSPEC 1.006 01/17/2022 1832   PHURINE 5.0 01/17/2022 1832   GLUCOSEU NEGATIVE 01/17/2022 1832   HGBUR NEGATIVE 01/17/2022 1832   BILIRUBINUR NEGATIVE 01/17/2022 1832   KETONESUR NEGATIVE 01/17/2022 1832   PROTEINUR NEGATIVE 01/17/2022 1832   NITRITE NEGATIVE 01/17/2022 1832   LEUKOCYTESUR NEGATIVE 01/17/2022 1832   Sepsis Labs: @LABRCNTIP (procalcitonin:4,lacticidven:4)  )No results found for this or any previous visit (from the past 240 hour(s)).   Radiology Studies: DG Chest Portable 1 View  Result Date: 01/29/2022 CLINICAL DATA:  dyspnea EXAM: PORTABLE CHEST 1 VIEW COMPARISON:  January 22, 2022 FINDINGS: Again seen are sternotomy wires and CABG changes. Cardiomediastinal silhouette is prominent, stable. Bilateral pleural effusion greater on the left. There has been interval significant improvement of the pulmonary edema. There is mild-to-moderate pulmonary vascular congestion seen. The visualized skeletal structures are unremarkable. IMPRESSION: 1. Mild-to-moderate bilateral pleural effusion greater on the left without significant interval change. 2. There has been significant resolution of the pulmonary edema. Presently, there is mild-to-moderate pulmonary  vascular congestion seen. Electronically Signed   By: January 24, 2022 M.D.   On: 01/29/2022 10:43     Scheduled Meds:  aspirin EC  81 mg Oral QPM   atorvastatin  80 mg Oral QPM   clopidogrel  75 mg Oral QPM   docusate sodium  100 mg Oral BID   enoxaparin (LOVENOX) injection  30 mg Subcutaneous Q24H   feeding supplement  237 mL Oral BID BM   furosemide  40 mg Intravenous BID   isosorbide mononitrate  60 mg Oral Daily   metoprolol succinate  25 mg Oral Daily   pantoprazole  40 mg Oral QPM   sodium chloride flush  3 mL Intravenous Q12H   spironolactone  12.5 mg Oral q morning   Continuous Infusions:   LOS: 1 day    Time spent: 03/31/2022    , MD [facility]   01/30/2022, 11:47 AM

## 2022-01-30 NOTE — Evaluation (Signed)
Occupational Therapy Evaluation Patient Details Name: Lindsey Pollard MRN: [id number] DOB: 08/05/1940 Today's Date: 01/30/2022   History of Present Illness 81 y.o. female admitted with acute onset dyspnea and  acute on chronic CHF  Medical history significant of recent Vfib arrest; chronic systolic CHF; CAD s/p CABG and PCI; stage 3 CKD; and DM who was admitted to [facility] from 7/20-8/1 for NSTEMI and multisystem organ failure including VDRF.  She was extubated on 7/25 and eventually discharged to SNF with palliative care.   Clinical Impression   PTA, recent DC from hospital to SNF for rehab. Prior to recent hospitalization, Lindsey Pollard lived independently with her best friend. Able to progress OOB to recliner with Min A (HHA +2 for safety). Mod A overall for ADL tasks primarily due to poor activity tolerance. VSS on 2L during session with exception of RR @ 33. Pt's goal is to get stronger so she can go to [place] to see her daughter, Lindsey Pollard. Acute OT to follow to facilitate safe DC back to SNF for rehab.      Recommendations for follow up therapy are one component of a multi-disciplinary discharge planning process, led by the attending physician.  Recommendations may be updated based on patient status, additional functional criteria and insurance authorization.   Follow Up Recommendations  Skilled nursing-short term rehab (<3 hours/day)    Assistance Recommended at Discharge Frequent or constant Supervision/Assistance  Patient can return home with the following A little help with walking and/or transfers;A lot of help with bathing/dressing/bathroom;Assistance with cooking/housework;Direct supervision/assist for medications management;Assist for transportation;Help with stairs or ramp for entrance    Functional Status Assessment  Patient has had a recent decline in their functional status and demonstrates the ability to make significant improvements in function in a reasonable and predictable amount of  time.  Equipment Recommendations       Recommendations for Other Services       Precautions / Restrictions Precautions Precautions: Fall Precaution Comments: watch RR; SpO2      Mobility Bed Mobility Overal bed mobility: Needs Assistance Bed Mobility: Supine to Sit     Supine to sit: Min assist          Transfers Overall transfer level: Needs assistance   Transfers: Sit to/from Stand Sit to Stand: Min assist Stand pivot transfers:  (HHA)   Step pivot transfers: Min assist, +2 safety/equipment            Balance     Sitting balance-Leahy Scale: Fair       Standing balance-Leahy Scale: Poor                             ADL either performed or assessed with clinical judgement   ADL Overall ADL's : Needs assistance/impaired     Grooming: Minimal assistance   Upper Body Bathing: Minimal assistance   Lower Body Bathing: Moderate assistance   Upper Body Dressing : Minimal assistance   Lower Body Dressing: Moderate assistance   Toilet Transfer: Minimal assistance;+2 for safety/equipment;Stand-pivot (simulated)   Toileting- Clothing Manipulation and Hygiene: Moderate assistance       Functional mobility during ADLs: Minimal assistance (HHA)       Vision         Perception     Praxis      Pertinent Vitals/Pain Pain Assessment Pain Assessment: Faces Faces Pain Scale: Hurts a little bit Pain Location: discomfort wtih breathing wtih activity Pain Descriptors / Indicators: Discomfort  Pain Intervention(s): Limited activity within patient's tolerance     Hand Dominance Right   Extremity/Trunk Assessment Upper Extremity Assessment Upper Extremity Assessment: Generalized weakness   Lower Extremity Assessment Lower Extremity Assessment: Defer to PT evaluation   Cervical / Trunk Assessment Cervical / Trunk Assessment: Normal   Communication Communication Communication: HOH   Cognition Arousal/Alertness:  Awake/alert Behavior During Therapy: WFL for tasks assessed/performed Overall Cognitive Status: No family/caregiver present to determine baseline cognitive functioning                                 General Comments: most likely close to baseline     General Comments       Exercises Other Exercises Other Exercises: encouraged pursed lip breathing   Shoulder Instructions      Home Living Family/patient expects to be discharged to:: Skilled nursing facility                                        Prior Functioning/Environment Prior Level of Function : Needs assist             Mobility Comments: at snf, was compleing stand pivot transfer. Had been there a short period of time adn hadn't "really started rehab". Assistance for ADL tasks; prior to hospitalization lived independently with her best friend          OT Problem List: Decreased strength;Decreased activity tolerance;Decreased safety awareness;Decreased knowledge of use of DME or AE;Cardiopulmonary status limiting activity      OT Treatment/Interventions: Self-care/ADL training;Energy conservation;DME and/or AE instruction;Therapeutic activities;Patient/family education    OT Goals(Current goals can be found in the care plan section) Acute Rehab OT Goals Patient Stated Goal: to go to [place] to see her daughter Lindsey Pollard OT Goal Formulation: With patient Time For Goal Achievement: 02/13/22 Potential to Achieve Goals: Fair  OT Frequency: Min 2X/week    Co-evaluation PT/OT/SLP Co-Evaluation/Treatment: Yes Reason for Co-Treatment: Other (comment) (activity tolerance)   OT goals addressed during session: ADL's and self-care      AM-PAC OT "6 Clicks" Daily Activity     Outcome Measure Help from another person eating meals?: None Help from another person taking care of personal grooming?: A Little Help from another person toileting, which includes using toliet, bedpan, or urinal?: A  Lot Help from another person bathing (including washing, rinsing, drying)?: A Lot Help from another person to put on and taking off regular upper body clothing?: A Little Help from another person to put on and taking off regular lower body clothing?: A Lot 6 Click Score: 16   End of Session Equipment Utilized During Treatment: Oxygen (2L) Nurse Communication: Mobility status  Activity Tolerance: Patient tolerated treatment well Patient left: in chair;with call bell/phone within reach;with chair alarm set  OT Visit Diagnosis: Unsteadiness on feet (R26.81);Muscle weakness (generalized) (M62.81);Pain Pain - part of body:  (generalized; wtih breathing discomfort)                Time: 0347-4259 OT Time Calculation (min): 24 min Charges:  OT General Charges $OT Visit: 1 Visit OT Evaluation $OT Eval Moderate Complexity: 1 Mod  Ethelyne Erich, OT/L   Acute OT Clinical Specialist Acute Rehabilitation Services Pager [phone] Office [phone]   [facility] 01/30/2022, 1:49 PM

## 2022-01-30 NOTE — Progress Notes (Signed)
Pt has PRN Bipap order, no distress at this time.

## 2022-01-30 NOTE — Evaluation (Signed)
Physical Therapy Evaluation Patient Details Name: Lindsey Pollard MRN: [id number] DOB: [birth year] Today's Date: 01/30/2022  History of Present Illness  81 y.o. female admitted with acute onset dyspnea and  acute on chronic CHF  Medical history significant of recent Vfib arrest; chronic systolic CHF; CAD s/p CABG and PCI; stage 3 CKD; and DM who was admitted to [facility] from 7/20-8/1 for NSTEMI and multisystem organ failure including VDRF.  She was extubated on 7/25 and eventually discharged to SNF with palliative care.  Clinical Impression  Pt admitted with above diagnosis and presents to PT with functional limitations due to deficits listed below (See PT problem list). Pt needs skilled PT to maximize independence and safety to allow discharge to SNF. Pt with poor activity tolerance and expect progress will be slow if medical status remains stable.         Recommendations for follow up therapy are one component of a multi-disciplinary discharge planning process, led by the attending physician.  Recommendations may be updated based on patient status, additional functional criteria and insurance authorization.  Follow Up Recommendations Skilled nursing-short term rehab (<3 hours/day) Can patient physically be transported by private vehicle: No    Assistance Recommended at Discharge Frequent or constant Supervision/Assistance  Patient can return home with the following  Two people to help with walking and/or transfers;Direct supervision/assist for financial management;Direct supervision/assist for medications management;Assistance with cooking/housework;A lot of help with bathing/dressing/bathroom;Help with stairs or ramp for entrance;Assist for transportation    Equipment Recommendations Wheelchair (measurements PT);Wheelchair cushion (measurements PT)  Recommendations for Other Services       Functional Status Assessment Patient has had a recent decline in their functional status and demonstrates  the ability to make significant improvements in function in a reasonable and predictable amount of time.     Precautions / Restrictions Precautions Precautions: Fall Precaution Comments: watch RR; SpO2      Mobility  Bed Mobility Overal bed mobility: Needs Assistance Bed Mobility: Supine to Sit     Supine to sit: Min assist     General bed mobility comments: Assist to elevate trunk into sitting    Transfers Overall transfer level: Needs assistance Equipment used: 1 person hand held assist Transfers: Sit to/from Stand Sit to Stand: Min assist   Step pivot transfers: Min assist, +2 safety/equipment       General transfer comment: Assist to bring hips up and for balance. Small steps bed to recliner.    Ambulation/Gait               General Gait Details: Did not attempt  Stairs            Wheelchair Mobility    Modified Rankin (Stroke Patients Only)       Balance Overall balance assessment: Needs assistance Sitting-balance support: No upper extremity supported, Feet supported Sitting balance-Leahy Scale: Fair     Standing balance support: Single extremity supported, Bilateral upper extremity supported Standing balance-Leahy Scale: Poor Standing balance comment: UE support and min assist                             Pertinent Vitals/Pain Pain Assessment Pain Assessment: Faces Faces Pain Scale: Hurts a little bit Pain Location: discomfort wtih breathing wtih activity Pain Descriptors / Indicators: Discomfort Pain Intervention(s): Limited activity within patient's tolerance, Monitored during session    Home Living Family/patient expects to be discharged to:: Skilled nursing facility  Prior Function Prior Level of Function : Needs assist             Mobility Comments: at snf, was compleing stand pivot transfer. Had been there a short period of time adn hadn't "really started rehab". Assistance  for ADL tasks; prior to hospitalization lived independently with her best friend       Hand Dominance   Dominant Hand: Right    Extremity/Trunk Assessment   Upper Extremity Assessment Upper Extremity Assessment: Defer to OT evaluation    Lower Extremity Assessment Lower Extremity Assessment: Generalized weakness    Cervical / Trunk Assessment Cervical / Trunk Assessment: Normal  Communication   Communication: HOH  Cognition Arousal/Alertness: Awake/alert Behavior During Therapy: WFL for tasks assessed/performed Overall Cognitive Status: No family/caregiver present to determine baseline cognitive functioning                                 General Comments: most likely close to baseline        General Comments General comments (skin integrity, edema, etc.): Pt on 2L O2 with RR 36. Verbal cues to slow breathing with pursed lip breathing.    Exercises     Assessment/Plan    PT Assessment Patient needs continued PT services  PT Problem List Decreased strength;Decreased activity tolerance;Decreased balance;Decreased mobility;Cardiopulmonary status limiting activity       PT Treatment Interventions DME instruction;Gait training;Functional mobility training;Therapeutic activities;Therapeutic exercise;Balance training;Patient/family education    PT Goals (Current goals can be found in the Care Plan section)  Acute Rehab PT Goals Patient Stated Goal: go to [place] PT Goal Formulation: With patient Time For Goal Achievement: 02/13/22 Potential to Achieve Goals: Good    Frequency Min 2X/week     Co-evaluation PT/OT/SLP Co-Evaluation/Treatment: Yes Reason for Co-Treatment: Other (comment) (activity tolerance) PT goals addressed during session: Mobility/safety with mobility OT goals addressed during session: ADL's and self-care       AM-PAC PT "6 Clicks" Mobility  Outcome Measure Help needed turning from your back to your side while in a flat bed  without using bedrails?: A Little Help needed moving from lying on your back to sitting on the side of a flat bed without using bedrails?: A Little Help needed moving to and from a bed to a chair (including a wheelchair)?: A Little Help needed standing up from a chair using your arms (e.g., wheelchair or bedside chair)?: A Little Help needed to walk in hospital room?: Total Help needed climbing 3-5 steps with a railing? : Total 6 Click Score: 14    End of Session Equipment Utilized During Treatment: Oxygen Activity Tolerance: Patient limited by fatigue Patient left: in chair;with call bell/phone within reach;with chair alarm set Nurse Communication: Mobility status PT Visit Diagnosis: Other abnormalities of gait and mobility (R26.89);Muscle weakness (generalized) (M62.81);Unsteadiness on feet (R26.81)    Time: 1130-1147 PT Time Calculation (min) (ACUTE ONLY): 17 min   Charges:   PT Evaluation $PT Eval Moderate Complexity: 1 Mod          [facility] PT Acute Rehabilitation Services Office [phone]   Angelina Ok [facility] 01/30/2022, 2:41 PM

## 2022-01-31 DIAGNOSIS — I5023 Acute on chronic systolic (congestive) heart failure: Secondary | ICD-10-CM | POA: Diagnosis not present

## 2022-01-31 LAB — COMPREHENSIVE METABOLIC PANEL
ALT: 22 U/L (ref 0–44)
AST: 22 U/L (ref 15–41)
Albumin: 2.9 g/dL — ABNORMAL LOW (ref 3.5–5.0)
Alkaline Phosphatase: 73 U/L (ref 38–126)
Anion gap: 12 (ref 5–15)
BUN: 63 mg/dL — ABNORMAL HIGH (ref 8–23)
CO2: 30 mmol/L (ref 22–32)
Calcium: 8.4 mg/dL — ABNORMAL LOW (ref 8.9–10.3)
Chloride: 91 mmol/L — ABNORMAL LOW (ref 98–111)
Creatinine, Ser: 1.35 mg/dL — ABNORMAL HIGH (ref 0.44–1.00)
GFR, Estimated: 40 mL/min — ABNORMAL LOW (ref >60)
Glucose, Bld: 98 mg/dL (ref 70–99)
Potassium: 4.8 mmol/L (ref 3.5–5.1)
Sodium: 133 mmol/L — ABNORMAL LOW (ref 135–145)
Total Bilirubin: 0.6 mg/dL (ref 0.3–1.2)
Total Protein: 5.1 g/dL — ABNORMAL LOW (ref 6.5–8.1)

## 2022-01-31 LAB — CBC
HCT: 25.6 % — ABNORMAL LOW (ref 36.0–46.0)
Hemoglobin: 8.3 g/dL — ABNORMAL LOW (ref 12.0–15.0)
MCH: 29.9 pg (ref 26.0–34.0)
MCHC: 32.4 g/dL (ref 30.0–36.0)
MCV: 92.1 fL (ref 80.0–100.0)
Platelets: 246 10*3/uL (ref 150–400)
RBC: 2.78 MIL/uL — ABNORMAL LOW (ref 3.87–5.11)
RDW: 14.9 % (ref 11.5–15.5)
WBC: 10.4 10*3/uL (ref 4.0–10.5)
nRBC: 0 % (ref 0.0–0.2)

## 2022-01-31 NOTE — Progress Notes (Signed)
Pt has PRN bipap orders, no distress noted at this time. 

## 2022-01-31 NOTE — Progress Notes (Signed)
Heart Failure Navigator Progress Note  Assessed for Heart & Vascular TOC clinic readiness.  Patient does not meet criteria due to planned Hospice at discharge.     Lindsey Pollard, BSN, Scientist, clinical (histocompatibility and immunogenetics) Only

## 2022-01-31 NOTE — TOC Initial Note (Signed)
Transition of Care [facility]) - Initial/Assessment Note    Patient Details  Name: Lindsey Pollard MRN: [id number] Date of Birth: [birth year]  [facility] CM/SW Contact:    Erin Sons, LCSW Phone Number: 01/31/2022, 10:36 AM  Clinical Narrative:                  CSW is informed by PMT that pt's daughter [name] is wanting to discuss pt's disposition and eventual LTCin [place]. CSW spoke with [name] over the phone who confirmed pt is short term at [facility]. CSW explained that hospital would not place LTC out of states. Plan is for pt to return to [facility] under STR with medicare. [name] explained eventually they would like to move pt to a LTC facility in [place]. She has already reached out to [facility] in [place] to help with placement but is waiting on a return call. CSW directed [name] to DSS to clarify process for transferring medicaid to a new state. CSW also provided instructions for [name] to utilize medicare.gov to identify SNFs in her area and to coordinate with Malvin Johns case worker to send any requested paperwork. [name] is Adult nurse. TOC will continue to follow to assist with DC back to [facility].   Expected Discharge Plan: Skilled Nursing Facility Barriers to Discharge: Continued Medical Work up   Patient Goals and CMS Choice        Expected Discharge Plan and Services Expected Discharge Plan: Skilled Nursing Facility                                              Prior Living Arrangements/Services                       Activities of Daily Living      Permission Sought/Granted                  Emotional Assessment              Admission diagnosis:  Acute on chronic systolic (congestive) heart failure (HCC) [I50.23] Patient Active Problem List   Diagnosis Date Noted   Acute on chronic systolic (congestive) heart failure (HCC) 01/29/2022   Goals of care, counseling/discussion 01/20/2022   Fever and chills  01/17/2022   Malnutrition of moderate degree 01/11/2022   Overweight (BMI 25.0-29.9) 01/06/2022   Cardiac arrest with successful resuscitation (HCC) 01/03/2022   Unstable angina (HCC) 12/07/2021   NSTEMI (non-ST elevated myocardial infarction) (HCC) 12/06/2021   Chronic kidney disease, stage 3a (HCC) 12/06/2021   Lung nodule 12/06/2021   Type II diabetes mellitus with renal manifestations (HCC) 12/06/2021   HTN (hypertension) 12/06/2021   HLD (hyperlipidemia) 12/06/2021   AKI (acute kidney injury) (HCC) in the setting of stage IIIa chronic    Anemia    Acute respiratory failure with hypoxia (HCC)    Chronic diastolic CHF (congestive heart failure) (HCC)    Fungal infection of the groin    Fungal infection of skin    Essential hypertension 06/01/2021   Carotid stenosis 10/26/2020   Atherosclerosis of native arteries of extremity with intermittent claudication (HCC) 09/16/2020   Carotid bruit present 09/16/2020   Healthcare maintenance 01/06/2019   Aortic calcification (HCC) 06/19/2017   Ischemic cardiomyopathy 06/19/2017   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/17/2017   PVD (peripheral vascular disease) (  HCC) 06/14/2017   Pseudophakia of right eye 11/10/2014   Bilateral hearing loss 10/05/2014   CAD (coronary artery disease) 10/05/2014   GERD (gastroesophageal reflux disease) 10/05/2014   RLS (restless legs syndrome) 09/09/2014   Mixed hyperlipidemia 08/27/2014   Type 2 diabetes mellitus with hyperlipidemia (HCC) 08/27/2014   PCP:  Lauro Regulus, MD Pharmacy:  No Pharmacies Listed    Social Determinants of Health (SDOH) Interventions    Readmission Risk Interventions    01/18/2022    9:04 PM 01/05/2022    5:09 PM  Readmission Risk Prevention Plan  Transportation Screening Complete Complete  PCP or Specialist Appt within 3-5 Days  Complete  Social Work Consult for Recovery Care Planning/Counseling  Not Complete  SW consult not completed comments  NA  Palliative Care Screening  Not  Applicable  Medication Review [employer]) Referral to Pharmacy Referral to Pharmacy  PCP or Specialist appointment within 3-5 days of discharge Complete   HRI or Home Care Consult Complete   SW Recovery Care/Counseling Consult Not Complete   SW Consult Not Complete Comments NA   Palliative Care Screening Not Applicable   Skilled Nursing Facility Complete

## 2022-01-31 NOTE — Plan of Care (Signed)
°  Problem: Coping: °Goal: Level of anxiety will decrease °Outcome: Progressing °  °

## 2022-01-31 NOTE — Progress Notes (Addendum)
PROGRESS NOTE    Lindsey Pollard  QBH:[id number] DOB: May 05, 1941 DOA: 01/29/2022 PCP: Lauro Regulus, MD  81/F with history of recent V-fib arrest, chronic systolic CHF, CAD/CABG and PCI, stage IIIa CKD, type 2 diabetes mellitus was recently admitted to [facility] from 7/20-8/1 with V-fib arrest, NSTEMI and respiratory failure requiring resuscitation and mechanical ventilation, extubated 7/25, treated medically for NSTEMI, seen by palliative care and then discharged to SNF with discussion to transition to hospice for further decline.  Brought to [facility] ED 8/7 with acute onset dyspnea In the ED she was noted to be in respiratory distress, SBP in the 200s with EMS, chest x-ray with pulmonary edema, BNP 1800 -Improving with BiPAP and diuretics  Subjective: -A bit tired but overall improving, breathing better  Assessment and Plan:  Acute on chronic systolic CHF -Last echo 2 weeks ago with a EF of 45%, admitted with NSTEMI, cardiac arrest then, treated medically -Required BiPAP on admission, chest x-ray with mild pulmonary edema -I/os are inaccurate, weight is unchanged as well, clinically improving, continue IV Lasix 40 Mg twice daily with Aldactone -BP soft, will stop Imdur, GDMT limited as result -Palliative care following, patient is hopeful to discharge to SNF with palliative care follow-up when stable, then travel to [place] near her daughter  H/o CAD, CABG and PCI  -Recent NSTEMI and cardiac arrest, treated medically  -Continue aspirin Plavix, Toprol, - Imdur discontinued today -No symptoms of ACS at this time   AKI on stage 3a CKD -Likely cardiorenal, gentle diuresis  Hyperkalemia -Labs hemolyzed yesterday, repeat improved   HTN -Continue Toprol, hold lisinopril   DM -Diet controlled, last A1c was 5.2  Goals of care -Based on previous palliative care discussions 10 days ago she expressed desire to transition to comfort for further decline and to avoid rehospitalization -Now  DNR, wishes to continue medical treatment -Appreciate palliative care input, she would be most appropriate for hospice services at discharge   DVT prophylaxis: Lovenox Code Status: DNR Family Communication: No family at bedside, left message for daughter Kevin Fenton Disposition Plan: Back to SNF likely 48 hours  Consultants: Palliative care   Procedures:   Antimicrobials:    Objective: Vitals:   01/31/22 0813 01/31/22 0905 01/31/22 0910 01/31/22 0911  BP: (!) 102/37 (!) 120/58    Pulse: 65     Resp: 11 18    Temp: (!) 97.5 F (36.4 C)     TempSrc: Oral     SpO2: 98% 96% (!) 88% 92%  Weight:      Height:        Intake/Output Summary (Last 24 hours) at 01/31/2022 1151 Last data filed at 01/31/2022 0814 Gross per 24 hour  Intake 507 ml  Output 700 ml  Net -193 ml   Filed Weights   01/29/22 0957 01/30/22 0100 01/31/22 0511  Weight: 56.7 kg 59.7 kg 60.1 kg    Examination:  General exam: Elderly frail chronically ill female sitting up in bed, AAOx3 HEENT: Positive JVD CVS: S1-S2, regular rhythm Lungs: Few basilar rales Abdomen: Soft, nontender, bowel sounds present Extremities: No edema Skin: No rashes Psychiatry:  Mood & affect appropriate.     Data Reviewed:   CBC: Recent Labs  Lab 01/29/22 1003 01/29/22 1133 01/30/22 0903 01/31/22 0345  WBC 13.6*  --  15.1* 10.4  NEUTROABS 10.0*  --   --   --   HGB 9.1* 8.5* 9.7* 8.3*  HCT 29.2* 25.0* 29.6* 25.6*  MCV 94.5  --  91.6 92.1  PLT 365  --  292 246   Basic Metabolic Panel: Recent Labs  Lab 01/29/22 1003 01/29/22 1133 01/30/22 0747 01/30/22 1123 01/31/22 0345  NA 132* 130* 132* 132* 133*  K 4.5 4.1 7.0* 5.6* 4.8  CL 94*  --  95* 93* 91*  CO2 27  --  23 28 30   GLUCOSE 217*  --  135* 126* 98  BUN 55*  --  68* 62* 63*  CREATININE 1.50*  --  1.53* 1.48* 1.35*  CALCIUM 8.8*  --  8.6* 8.6* 8.4*  MG 2.7*  --   --   --   --    GFR: Estimated Creatinine Clearance: 26.2 mL/min (A) (by C-G formula  based on SCr of 1.35 mg/dL (H)). Liver Function Tests: Recent Labs  Lab 01/29/22 1003 01/31/22 0345  AST 33 22  ALT 28 22  ALKPHOS 103 73  BILITOT 1.0 0.6  PROT 6.1* 5.1*  ALBUMIN 3.3* 2.9*   No results for input(s): "LIPASE", "AMYLASE" in the last 168 hours. No results for input(s): "AMMONIA" in the last 168 hours. Coagulation Profile: No results for input(s): "INR", "PROTIME" in the last 168 hours. Cardiac Enzymes: No results for input(s): "CKTOTAL", "CKMB", "CKMBINDEX", "TROPONINI" in the last 168 hours. BNP (last 3 results) No results for input(s): "PROBNP" in the last 8760 hours. HbA1C: No results for input(s): "HGBA1C" in the last 72 hours. CBG: Recent Labs  Lab 01/29/22 1829 01/30/22 1039  GLUCAP 251* 126*   Lipid Profile: No results for input(s): "CHOL", "HDL", "LDLCALC", "TRIG", "CHOLHDL", "LDLDIRECT" in the last 72 hours. Thyroid Function Tests: No results for input(s): "TSH", "T4TOTAL", "FREET4", "T3FREE", "THYROIDAB" in the last 72 hours. Anemia Panel: No results for input(s): "VITAMINB12", "FOLATE", "FERRITIN", "TIBC", "IRON", "RETICCTPCT" in the last 72 hours. Urine analysis:    Component Value Date/Time   COLORURINE STRAW (A) 01/17/2022 1832   APPEARANCEUR CLEAR (A) 01/17/2022 1832   LABSPEC 1.006 01/17/2022 1832   PHURINE 5.0 01/17/2022 1832   GLUCOSEU NEGATIVE 01/17/2022 1832   HGBUR NEGATIVE 01/17/2022 1832   BILIRUBINUR NEGATIVE 01/17/2022 1832   KETONESUR NEGATIVE 01/17/2022 1832   PROTEINUR NEGATIVE 01/17/2022 1832   NITRITE NEGATIVE 01/17/2022 1832   LEUKOCYTESUR NEGATIVE 01/17/2022 1832   Sepsis Labs: @LABRCNTIP (procalcitonin:4,lacticidven:4)  )No results found for this or any previous visit (from the past 240 hour(s)).   Radiology Studies: No results found.   Scheduled Meds:  aspirin EC  81 mg Oral QPM   atorvastatin  80 mg Oral QPM   clopidogrel  75 mg Oral QPM   docusate sodium  100 mg Oral BID   enoxaparin (LOVENOX) injection   30 mg Subcutaneous Q24H   feeding supplement  237 mL Oral BID BM   furosemide  40 mg Intravenous BID   metoprolol succinate  25 mg Oral Daily   pantoprazole  40 mg Oral QPM   sodium chloride flush  3 mL Intravenous Q12H   spironolactone  12.5 mg Oral q morning   Continuous Infusions:   LOS: 2 days    Time spent: 01/19/2022    , MD [facility]   01/31/2022, 11:51 AM

## 2022-01-31 NOTE — Progress Notes (Signed)
Daily Progress Note   Patient Name: Lindsey Pollard       Date: 01/31/2022 DOB: 11/20/1940  Age: 81 y.o. MRN#: [id number] Attending Physician: Zannie Cove, MD Primary Care Physician: Lauro Regulus, MD Admit Date: 01/29/2022  Reason for Consultation/Follow-up: Establishing goals of care  Subjective: Chart review performed including progress notes, labs, imaging.  Patient assessed at the bedside.  She reports feeling tired, no other concerns.  No family present during my visit.  Called patient's daughter [name] to provide ongoing palliative support but was unable to reach.  Noted that she was able to speak with TOC and get her questions addressed.  Left voicemail with PMT contact information should other needs arise.  Encouraged to call with questions and/or concerns.  Length of Stay: 2          Nursing notes and vital signs reviewed. General: Elderly Caucasian female in no acute distress Respiratory: normal effort without respiratory distress Cardiac: Normal rate Neuro: Alert Psych: Cooperative,   Vital Signs: BP (!) 115/37 (BP Location: Right Arm)   Pulse 77   Temp (!) 97.4 F (36.3 C) (Oral)   Resp (!) 21   Ht 4\' 11"  (1.499 m)   Wt 60.1 kg   SpO2 91%   BMI 26.76 kg/m  SpO2: SpO2: 91 % O2 Device: O2 Device: Nasal Cannula O2 Flow Rate: O2 Flow Rate (L/min): 1 L/min       Palliative Assessment/Data: PPS 40%     Palliative Care Assessment & Plan   Patient Profile: 81 y.o. female  with past medical history of  recent Vfib arrest, chronic systolic CHF, CAD s/p CABG and PCI, stage 3 CKD, and DM presented to ED on 01/29/22 from [facility] with respiratory distress. On arrival to ED she was placed on BiPap. Of note, patient was recently hospitalized from 7/20-01/23/22 for  STEMI and multisystem organ failure including VDRF. She was extubated on 01/16/22 and eventually discharged to SNF with palliative care.  Patient was admitted on 01/29/2022 with acute on chronic systolic CHF, AKI on stage 3a CKD, CAD/recent arrest.    Patient was seen by PMT during previous hospitalization where she indicated during MOST form completion for DNR, comfort measures, determine use or limitation of antibiotics if infection occurs, IVF trial period, no feeding tube.   Patient has had  3 admissions and 1 ED visit in the last 6 months; she is a 7 day readmission.  Assessment: Principal Problem:   Acute on chronic systolic (congestive) heart failure (HCC) Active Problems:   CAD (coronary artery disease)   Mixed hyperlipidemia   AKI (acute kidney injury) (HCC) in the setting of stage IIIa chronic   Type II diabetes mellitus with renal manifestations (HCC)   HTN (hypertension)   Cardiac arrest with successful resuscitation (HCC)   Goals of care, counseling/discussion   Recommendations/Plan: Continue DNR Continue full scope of treatment otherwise Patient remains appropriate for discharge to SNF with palliative care follow-up when medically stable; ultimate goal is for transfer to LTC in [place] to be near her daughter for enrollment in hospice PMT will continue to follow peripherally.  Please secure chat or call team line with urgent needs   Prognosis:  Unable to determine  Discharge Planning: Skilled Nursing Facility for rehab with Palliative care service follow-up   Total time: I spent 35 minutes in the care of the patient today in the above activities and documenting the encounter.   Richardson Dopp, PA-C Palliative Medicine Team Team phone # [phone]  Thank you for allowing the Palliative Medicine Team to assist in the care of this patient. Please utilize secure chat with additional questions, if there is no response within 30 minutes please call the above phone  number.  Palliative Medicine Team providers are available by phone from 7am to 7pm daily and can be reached through the team cell phone.  Should this patient require assistance outside of these hours, please call the patient's attending physician.

## 2022-02-01 DIAGNOSIS — L8990 Pressure ulcer of unspecified site, unspecified stage: Secondary | ICD-10-CM | POA: Diagnosis present

## 2022-02-01 DIAGNOSIS — I5023 Acute on chronic systolic (congestive) heart failure: Secondary | ICD-10-CM | POA: Diagnosis not present

## 2022-02-01 DIAGNOSIS — N179 Acute kidney failure, unspecified: Secondary | ICD-10-CM | POA: Diagnosis not present

## 2022-02-01 DIAGNOSIS — I25118 Atherosclerotic heart disease of native coronary artery with other forms of angina pectoris: Secondary | ICD-10-CM | POA: Diagnosis not present

## 2022-02-01 DIAGNOSIS — I10 Essential (primary) hypertension: Secondary | ICD-10-CM | POA: Diagnosis not present

## 2022-02-01 LAB — BASIC METABOLIC PANEL
Anion gap: 6 (ref 5–15)
BUN: 60 mg/dL — ABNORMAL HIGH (ref 8–23)
CO2: 32 mmol/L (ref 22–32)
Calcium: 8.3 mg/dL — ABNORMAL LOW (ref 8.9–10.3)
Chloride: 93 mmol/L — ABNORMAL LOW (ref 98–111)
Creatinine, Ser: 1.19 mg/dL — ABNORMAL HIGH (ref 0.44–1.00)
GFR, Estimated: 46 mL/min — ABNORMAL LOW (ref >60)
Glucose, Bld: 90 mg/dL (ref 70–99)
Potassium: 4.6 mmol/L (ref 3.5–5.1)
Sodium: 131 mmol/L — ABNORMAL LOW (ref 135–145)

## 2022-02-01 MED ORDER — EMPAGLIFLOZIN 10 MG PO TABS
10.0000 mg | ORAL_TABLET | Freq: Every day | ORAL | Status: DC
  Administered 2022-02-01 – 2022-02-03 (×3): 10 mg via ORAL
  Filled 2022-02-01 (×3): qty 1

## 2022-02-01 NOTE — Hospital Course (Addendum)
Mrs. Lindsey Pollard was admitted to the hospital with the working diagnosis of decompensated heart failure.   81 yo female with the past medical history of heart failure, hx of ventricular fibrillation cardiac arrest, coronary artery disease sp CABG, CKD sage 3, and T2DM. He was admitted to [facility] on 07/20 to 01/23/22 for NSTEMI. Then he required mechanical ventilation, he was eventually liberated from the ventilator and was discharged to SNF with palliative care. Patient had severe dyspnea and was transported back to the hospital. On her initial physical examination she was in respiratory distress and was placed on non invasive mechanical ventilation. Blood pressure 114/51, HR 103, RR 33 and 02 saturation 97%, lungs with poor air movement, increased work of breathing on Bipap, heart with S1 and S2 present and rhythm, abdomen not distended and no lower extremity edema.   Na 137, K 4.5 Cl 94, bicarbonate 27, glucose 217, bun 55 cr 1,50  BNP 1,815  High sensitive troponin 30  Wbc 13,6 hgb 9,1 plt 365   Chest radiograph with cardiomegaly, bilateral hilar vascular congestion with cephalization of the vasculature, bilateral pleural effusions.   EKG 103 bpm, normal axis, 1st degree AV block, sinus rhythm with no significant ST segment or T wave changes, poor R R wave progression.   Patient was placed on diuretic therapy with improvement in her symptoms.  Patient will continue physical therapy as outpatient at SNF.

## 2022-02-01 NOTE — Assessment & Plan Note (Addendum)
Her glucose remained well controlled during her hospitalization.   Dyslipidemia., continue with statin therapy.

## 2022-02-01 NOTE — Assessment & Plan Note (Addendum)
Echocardiogram with mild reduced LV systolic function with EF 45 to 50%, global hypokinesis, LV internal cavity with mild dilatation, RV systolic function preserved.   Patient with improvement in volume status. Her blood pressure systolic is 114 to 127 mmHg,   Plan to continue diuresis with oral furosemide, spironolactone and empagliflozin.  Continue with metoprolol Holding on ARB or ace Inh due to risk of hypotension.  Plan for follow up as outpatient.

## 2022-02-01 NOTE — TOC Progression Note (Signed)
Transition of Care [facility]) - Progression Note    Patient Details  Name: Lindsey Pollard MRN: [id number] Date of Birth: [birth year]  [facility] CM/SW Contact  Carime Dinkel Aris [place], [place] Phone Number: 02/01/2022, 1:28 PM  Clinical Narrative:     CSW is notified by RN that pt daughter requesting to speak with CSW. CSW called daughter and left message.   CSW submitted SNF auth request in [employer] portal; status pending.    Expected Discharge Plan: Skilled Nursing Facility Barriers to Discharge: Continued Medical Work up  Expected Discharge Plan and Services Expected Discharge Plan: Skilled Nursing Facility                                               Social Determinants of Health (SDOH) Interventions    Readmission Risk Interventions    01/18/2022    9:04 PM 01/05/2022    5:09 PM  Readmission Risk Prevention Plan  Transportation Screening Complete Complete  PCP or Specialist Appt within 3-5 Days  Complete  Social Work Consult for Recovery Care Planning/Counseling  Not Complete  SW consult not completed comments  NA  Palliative Care Screening  Not Applicable  Medication Review [employer]) Referral to Pharmacy Referral to Pharmacy  PCP or Specialist appointment within 3-5 days of discharge Complete   HRI or Home Care Consult Complete   SW Recovery Care/Counseling Consult Not Complete   SW Consult Not Complete Comments NA   Palliative Care Screening Not Applicable   Skilled Nursing Facility Complete

## 2022-02-01 NOTE — Assessment & Plan Note (Addendum)
Hyponatremia, hyperkalemia.  Patient has responded well to diuresis, her renal function has a serum cr of 1,0 with K at 3,9 and serum bicarbonate at 31. Na is 133.   Plan to continue diuresis with furosemide, spironolactone and empagliflozin.  Follow up renal function and electrolytes as outpatient.

## 2022-02-01 NOTE — Assessment & Plan Note (Addendum)
No chest pain. Continue with statin, dual antiplatelet therapy and metoprolol.

## 2022-02-01 NOTE — Progress Notes (Signed)
Occupational Therapy Treatment Patient Details Name: Lindsey Pollard MRN: [id number] DOB: 1940/12/14 Today's Date: 02/01/2022   History of present illness 81 y.o. female admitted with acute onset dyspnea and  acute on chronic CHF  Medical history significant of recent Vfib arrest; chronic systolic CHF; CAD s/p CABG and PCI; stage 3 CKD; and DM who was admitted to [facility] from 7/20-8/1 for NSTEMI and multisystem organ failure including VDRF.  She was extubated on 7/25 and eventually discharged to SNF with palliative care.   OT comments  Pt making progress with OT goals. She continues to be limited by decreased activity tolerance and weakness. Overall requiring up to min A for functional mobility, however can do it with min guard and increased encouragement. Unable to tolerate long periods of standing. Continuing to recommend SNF to maximize independence and safety. OT will follow acutely.    Recommendations for follow up therapy are one component of a multi-disciplinary discharge planning process, led by the attending physician.  Recommendations may be updated based on patient status, additional functional criteria and insurance authorization.    Follow Up Recommendations  Skilled nursing-short term rehab (<3 hours/day)    Assistance Recommended at Discharge Frequent or constant Supervision/Assistance  Patient can return home with the following  A little help with walking and/or transfers;A lot of help with bathing/dressing/bathroom;Assistance with cooking/housework;Direct supervision/assist for medications management;Assist for transportation;Help with stairs or ramp for entrance   Equipment Recommendations  Other (comment) (defer to next venue)    Recommendations for Other Services      Precautions / Restrictions Precautions Precautions: Fall Precaution Comments: watch RR; SpO2 Restrictions Weight Bearing Restrictions: No       Mobility Bed Mobility Overal bed mobility: Needs  Assistance Bed Mobility: Supine to Sit, Sit to Supine     Supine to sit: Supervision Sit to supine: Supervision   General bed mobility comments: Assist for safety/lines and use of rail    Transfers Overall transfer level: Needs assistance Equipment used: Rolling walker (2 wheels) Transfers: Sit to/from Stand Sit to Stand: Min assist           General transfer comment: Assist to power up and for balance, fatigues quickly     Balance Overall balance assessment: Needs assistance Sitting-balance support: No upper extremity supported, Feet supported Sitting balance-Leahy Scale: Fair     Standing balance support: Reliant on assistive device for balance Standing balance-Leahy Scale: Poor Standing balance comment: UE support and min guard assist                           ADL either performed or assessed with clinical judgement   ADL Overall ADL's : Needs assistance/impaired                         Toilet Transfer: Minimal assistance;Stand-pivot Statistician Details (indicate cue type and reason): simulated with sit to stands and step pivots Toileting- Clothing Manipulation and Hygiene: Minimal assistance;Sitting/lateral lean;Sit to/from stand Toileting - Clothing Manipulation Details (indicate cue type and reason): assist needed for steadying     Functional mobility during ADLs: Minimal assistance;Rolling walker (2 wheels) General ADL Comments: Limited activity tolerance, weakness, and balance deficits    Extremity/Trunk Assessment              Vision       Perception     Praxis      Cognition Arousal/Alertness: Awake/alert Behavior During Therapy: Flat  affect Overall Cognitive Status: No family/caregiver present to determine baseline cognitive functioning                                 General Comments: most likely close to baseline        Exercises Exercises: Other exercises Other Exercises Other Exercises:  Seated kick outs x10 Other Exercises: Seated marching x10 Other Exercises: toe taps x10 Other Exercises: sit to stands x3    Shoulder Instructions       General Comments VSS on2L, however pt presents with SoB throuhgout session, even at rest    Pertinent Vitals/ Pain       Pain Assessment Pain Assessment: No/denies pain  Home Living                                          Prior Functioning/Environment              Frequency  Min 2X/week        Progress Toward Goals  OT Goals(current goals can now be found in the care plan section)  Progress towards OT goals: Progressing toward goals  Acute Rehab OT Goals Patient Stated Goal: To get strongetr OT Goal Formulation: With patient Time For Goal Achievement: 02/13/22 Potential to Achieve Goals: Fair ADL Goals Pt Will Perform Upper Body Bathing: with set-up;sitting Pt Will Perform Lower Body Bathing: with min assist;sit to/from stand Pt Will Transfer to Toilet: stand pivot transfer;bedside commode;with supervision Additional ADL Goal #1: Pt will independently demonstrate 3 energy conservation strategis during ADL tasks  Plan Frequency remains appropriate;Discharge plan remains appropriate    Co-evaluation                 AM-PAC OT "6 Clicks" Daily Activity     Outcome Measure   Help from another person eating meals?: None Help from another person taking care of personal grooming?: A Little Help from another person toileting, which includes using toliet, bedpan, or urinal?: A Lot Help from another person bathing (including washing, rinsing, drying)?: A Lot Help from another person to put on and taking off regular upper body clothing?: A Little Help from another person to put on and taking off regular lower body clothing?: A Lot 6 Click Score: 16    End of Session Equipment Utilized During Treatment: Gait belt;Rolling walker (2 wheels);Oxygen  OT Visit Diagnosis: Unsteadiness on feet  (R26.81);Muscle weakness (generalized) (M62.81);Pain   Activity Tolerance Patient tolerated treatment well   Patient Left in bed;with call bell/phone within reach;with bed alarm set   Nurse Communication Mobility status        Time: 1445-1459 OT Time Calculation (min): 14 min  Charges: OT General Charges $OT Visit: 1 Visit OT Treatments $Therapeutic Activity: 8-22 mins  Zondra Lawlor H., OTR/L Acute Rehabilitation  Devlin Brink Elane Carmelle Bamberg 02/01/2022, 3:06 PM

## 2022-02-01 NOTE — Progress Notes (Signed)
Patient refused use of BIPAP for the evening.  °

## 2022-02-01 NOTE — Assessment & Plan Note (Addendum)
  Active Pressure Injury/Wound(s)     Pressure Ulcer  Duration          Pressure Injury 01/29/22 Sacrum Mid;Left Stage 2 -  Partial thickness loss of dermis presenting as a shallow open injury with a red, pink wound bed without slough. 2 days             Continue local wound care.

## 2022-02-01 NOTE — Assessment & Plan Note (Addendum)
Continue blood pressure control with furosemide, spironolactone and metoprolol. Diuresis with empagliflozin

## 2022-02-01 NOTE — Progress Notes (Addendum)
Progress Note   Patient: Lindsey Pollard ZOX:[id number] DOB: 1940/11/11 DOA: 01/29/2022     3 DOS: the patient was seen and examined on 02/01/2022   Brief hospital course: [name] was admitted to the hospital with the working diagnosis of decompensated heart failure.   81 yo female with the past medical history of heart failure, hx of ventricular fibrillation cardiac arrest, coronary artery disease sp CABG, CKD sage 3, and T2DM. He was admitted to [facility] on 07/20 to 01/23/22 for NSTEMI. Then he required mechanical ventilation, he was eventually liberated from the ventilator and was discharged to SNF with palliative care. Patient had severe dyspnea and was transported back to the hospital. On her initial physical examination she was in respiratory distress and was placed on non invasive mechanical ventilation. Blood pressure 114/51, HR 103, RR 33 and 02 saturation 97%, lungs with poor air movement, increased work of breathing on Bipap, heart with S1 and S2 present and rhythm, abdomen not distended and no lower extremity edema.   Na 137, K 4.5 Cl 94, bicarbonate 27, glucose 217, bun 55 cr 1,50  BNP 1,815  High sensitive troponin 30  Wbc 13,6 hgb 9,1 plt 365   Chest radiograph with cardiomegaly, bilateral hilar vascular congestion with cephalization of the vasculature, bilateral pleural effusions.   EKG 103 bpm, normal axis, 1st degree AV block, sinus rhythm with no significant ST segment or T wave changes, poor R R wave progression.   Patient was placed on diuretic therapy.   Assessment and Plan: * Acute on chronic systolic (congestive) heart failure (HCC) Echocardiogram with mild reduced LV systolic function with EF 45 to 50%, global hypokinesis, LV internal cavity with mild dilatation, RV systolic function preserved.   Documented urine output is 650 ml Systolic blood pressure is 107 to 113 mmHg.   Plan to continue diuresis with furosemide 40 mg IV q12 Add SGLT 2 inh  Continue with metoprolol  and spironolactone.     AKI (acute kidney injury) (HCC) in the setting of stage IIIa chronic Hyponatremia, hyperkalemia.  Renal function with serum cr at 1,19 with K at 4,6 and serum bicarbonate at 32.  Plan to continue diuresis with furosemide and spironolactone.  Add SGLT 2 inh Follow up renal function in am.   CAD (coronary artery disease) No chest pain. Continue with statin, dual antiplatelet therapy and metoprolol.   Type II diabetes mellitus with renal manifestations (HCC) Glucose has been stable with fasting at 90 mg/dl Plan to continue holding insulin and monitor capillary glucose as needed.   Dyslipidemia., continue with statin therapy.   HTN (hypertension) Continue blood pressure monitoring.   Pressure injury of skin  Active Pressure Injury/Wound(s)     Pressure Ulcer  Duration          Pressure Injury 01/29/22 Sacrum Mid;Left Stage 2 -  Partial thickness loss of dermis presenting as a shallow open injury with a red, pink wound bed without slough. 2 days             Continue local wound care.         Subjective: Patient is feeling better, continue to have dyspnea and not back to her baseline   Physical Exam: Vitals:   02/01/22 0350 02/01/22 0829 02/01/22 1218 02/01/22 1219  BP:  (!) 107/39  (!) 113/40  Pulse:  61  68  Resp:  (!) 22  13  Temp:  (!) 96.6 F (35.9 C) 97.8 F (36.6 C) 97.8 F (36.6 C)  TempSrc:  Oral  Oral  SpO2:  99%  90%  Weight: 57.9 kg     Height:       Neurology awake and alert ENT with no pallor Cardiovascular with S1 and S2 present and rhythmic with no gallops Respiratory with scattered rales but not wheezing  Abdomen not distended No lower extremity edema  Data Reviewed:    Family Communication: no family at the bedside   Disposition: Status is: Inpatient Remains inpatient appropriate because: heart failure   Planned Discharge Destination: Skilled nursing facility      Author: Coralie Keens,  MD 02/01/2022 2:40 PM  For on call review [email or web address].

## 2022-02-01 NOTE — Care Management Important Message (Signed)
Important Message  Patient Details  Name: Lindsey Pollard MRN: [id number] Date of Birth: 1941-05-23   Medicare Important Message Given:  Yes     Renie Ora 02/01/2022, 7:55 AM

## 2022-02-01 NOTE — Progress Notes (Signed)
Daily Progress Note   Patient Name: Lindsey Pollard       Date: 02/01/2022 DOB: [birth year]  Age: 81 y.o. MRN#: [id number] Attending Physician: Coralie Keens Primary Care Physician: Lauro Regulus, MD Admit Date: 01/29/2022  Reason for Consultation/Follow-up: Establishing goals of care  Subjective: Chart review performed including progress notes, labs  Patient assessed at the bedside.  She reports feeling tired.  No family present during my visit.  Bevely shares with me the progress she was able to make with PT today.  Attributes her fatigue to this exertion.  Upon further discussion of her symptoms, she endorses some anxiety.  She just found out yesterday that her sister was in a bad car accident and she has not heard anything else.  She is worried that her sister is not going to make it.  Offered my condolences and discussed her coping mechanisms.  She is working on her breathing and did take her anxiety medication earlier today.  She is agreeable to this PA reaching out to RN for her second as needed dose.  Counseled patient that she has 3 available doses as needed per day of Xanax.  MOST form was introduced given upcoming discharge back to SNF with goals for eventual transition to hospice from there. An extensive conversation was had, covering concepts specific to code status, artifical feeding and hydration, continued IV antibiotics and rehospitalization. Offered to assist with completion however she would like to defer to the outpatient setting, as she is unable to read without her glasses.  Encouraged to call with questions and/or concerns.  PMT will continue to support holistically  Length of Stay: 3          Nursing notes and vital signs reviewed. General: Elderly Caucasian  female in no acute distress Respiratory: normal effort without respiratory distress Cardiac: Normal rate Neuro: Alert and oriented Psych: Cooperative, anxious mood   Vital Signs: BP (!) 113/40 (BP Location: Right Arm)   Pulse 68   Temp 97.8 F (36.6 C) (Oral)   Resp 13   Ht 4\' 11"  (1.499 m)   Wt 57.9 kg   SpO2 90%   BMI 25.78 kg/m  SpO2: SpO2: 90 % O2 Device: O2 Device: Nasal Cannula O2 Flow Rate: O2 Flow Rate (L/min): 2 L/min       Palliative  Assessment/Data: PPS 40%     Palliative Care Assessment & Plan   Patient Profile: 81 y.o. female  with past medical history of  recent Vfib arrest, chronic systolic CHF, CAD s/p CABG and PCI, stage 3 CKD, and DM presented to ED on 01/29/22 from [facility] with respiratory distress. On arrival to ED she was placed on BiPap. Of note, patient was recently hospitalized from 7/20-01/23/22 for STEMI and multisystem organ failure including VDRF. She was extubated on 01/16/22 and eventually discharged to SNF with palliative care.  Patient was admitted on 01/29/2022 with acute on chronic systolic CHF, AKI on stage 3a CKD, CAD/recent arrest.    Patient was seen by PMT during previous hospitalization where she indicated during MOST form completion for DNR, comfort measures, determine use or limitation of antibiotics if infection occurs, IVF trial period, no feeding tube.   Patient has had 3 admissions and 1 ED visit in the last 6 months; she is a 7 day readmission.  Assessment: Principal Problem:   Acute on chronic systolic (congestive) heart failure (HCC) Active Problems:   CAD (coronary artery disease)   AKI (acute kidney injury) (HCC) in the setting of stage IIIa chronic   Type II diabetes mellitus with renal manifestations (HCC)   HTN (hypertension)   Pressure injury of skin   Recommendations/Plan: Continue DNR Continue full scope of treatment otherwise Patient defers MOST form completion today Patient remains appropriate for discharge to  SNF with palliative care follow-up when medically stable; ultimate goal is for transfer to LTC in [place] to be near her daughter for enrollment in hospice PMT remains available as needed.  Please secure chat or call team line should urgent needs arise   Prognosis:  Unable to determine  Discharge Planning: Skilled Nursing Facility for rehab with Palliative care service follow-up   Total time: I spent 50 minutes in the care of the patient today in the above activities and documenting the encounter.   Richardson Dopp, PA-C Palliative Medicine Team Team phone # [phone]  Thank you for allowing the Palliative Medicine Team to assist in the care of this patient. Please utilize secure chat with additional questions, if there is no response within 30 minutes please call the above phone number.  Palliative Medicine Team providers are available by phone from 7am to 7pm daily and can be reached through the team cell phone.  Should this patient require assistance outside of these hours, please call the patient's attending physician.

## 2022-02-01 NOTE — Progress Notes (Signed)
Physical Therapy Treatment Patient Details Name: Lindsey Pollard MRN: [id number] DOB: 04-25-1941 Today's Date: 02/01/2022   History of Present Illness 81 y.o. female admitted with acute onset dyspnea and  acute on chronic CHF  Medical history significant of recent Vfib arrest; chronic systolic CHF; CAD s/p CABG and PCI; stage 3 CKD; and DM who was admitted to [facility] from 7/20-8/1 for NSTEMI and multisystem organ failure including VDRF.  She was extubated on 7/25 and eventually discharged to SNF with palliative care.    PT Comments    Pt making slow, steady progress. Able to tolerate several short bouts of ambulation with assist and seated rest breaks in between. Continue to recommend ST-SNF at dc.    Recommendations for follow up therapy are one component of a multi-disciplinary discharge planning process, led by the attending physician.  Recommendations may be updated based on patient status, additional functional criteria and insurance authorization.  Follow Up Recommendations  Skilled nursing-short term rehab (<3 hours/day) Can patient physically be transported by private vehicle: No   Assistance Recommended at Discharge Frequent or constant Supervision/Assistance  Patient can return home with the following Two people to help with walking and/or transfers;Direct supervision/assist for financial management;Direct supervision/assist for medications management;Assistance with cooking/housework;A lot of help with bathing/dressing/bathroom;Help with stairs or ramp for entrance;Assist for transportation   Equipment Recommendations  Wheelchair (measurements PT);Wheelchair cushion (measurements PT)    Recommendations for Other Services       Precautions / Restrictions Precautions Precautions: Fall Precaution Comments: watch RR; SpO2     Mobility  Bed Mobility Overal bed mobility: Needs Assistance Bed Mobility: Supine to Sit, Sit to Supine     Supine to sit: Min guard Sit to supine: Min  guard   General bed mobility comments: Assist for safety/lines and use of rail    Transfers Overall transfer level: Needs assistance Equipment used: Rolling walker (2 wheels) Transfers: Sit to/from Stand Sit to Stand: Min assist           General transfer comment: Assist to bring hips up and for balance.    Ambulation/Gait Ambulation/Gait assistance: Min guard Gait Distance (Feet): 15 Feet (5' x 1, 10' x 1, 15' x 1, 8' x 2) Assistive device: Rolling walker (2 wheels) Gait Pattern/deviations: Step-through pattern, Decreased step length - right, Decreased step length - left, Trunk flexed Gait velocity: decr Gait velocity interpretation: <1.31 ft/sec, indicative of household ambulator   General Gait Details: Assist for safety and lines with second person following with rollator for pt to sit and rest on due to quickl fatigue   Stairs             Wheelchair Mobility    Modified Rankin (Stroke Patients Only)       Balance Overall balance assessment: Needs assistance Sitting-balance support: No upper extremity supported, Feet supported Sitting balance-Leahy Scale: Fair     Standing balance support: Single extremity supported, Bilateral upper extremity supported Standing balance-Leahy Scale: Poor Standing balance comment: UE support and min guard assist                            Cognition Arousal/Alertness: Awake/alert Behavior During Therapy: Flat affect Overall Cognitive Status: No family/caregiver present to determine baseline cognitive functioning                                 General Comments: most likely  close to baseline        Exercises      General Comments General comments (skin integrity, edema, etc.): Pt on 2L with SpO2 89-90% and RR 30's      Pertinent Vitals/Pain Pain Assessment Pain Assessment: No/denies pain    Home Living                          Prior Function            PT Goals  (current goals can now be found in the care plan section) Acute Rehab PT Goals Patient Stated Goal: go to [place] Progress towards PT goals: Progressing toward goals    Frequency    Min 2X/week      PT Plan Current plan remains appropriate    Co-evaluation PT/OT/SLP Co-Evaluation/Treatment: Yes            AM-PAC PT "6 Clicks" Mobility   Outcome Measure  Help needed turning from your back to your side while in a flat bed without using bedrails?: A Little Help needed moving from lying on your back to sitting on the side of a flat bed without using bedrails?: A Little Help needed moving to and from a bed to a chair (including a wheelchair)?: A Little Help needed standing up from a chair using your arms (e.g., wheelchair or bedside chair)?: A Little Help needed to walk in hospital room?: Total Help needed climbing 3-5 steps with a railing? : Total 6 Click Score: 14    End of Session Equipment Utilized During Treatment: Oxygen;Gait belt Activity Tolerance: Patient limited by fatigue Patient left: with call bell/phone within reach;in bed;with bed alarm set Nurse Communication: Mobility status PT Visit Diagnosis: Other abnormalities of gait and mobility (R26.89);Muscle weakness (generalized) (M62.81);Unsteadiness on feet (R26.81)     Time: 1029-1101 PT Time Calculation (min) (ACUTE ONLY): 32 min  Charges:  $Gait Training: 23-37 mins                     [facility] PT Acute Rehabilitation Services Office [phone]    Angelina Ok [facility] 02/01/2022, 11:30 AM

## 2022-02-02 ENCOUNTER — Other Ambulatory Visit (HOSPITAL_COMMUNITY): Payer: Self-pay

## 2022-02-02 ENCOUNTER — Telehealth (HOSPITAL_COMMUNITY): Payer: Self-pay | Admitting: Pharmacy Technician

## 2022-02-02 LAB — BASIC METABOLIC PANEL
Anion gap: 8 (ref 5–15)
BUN: 53 mg/dL — ABNORMAL HIGH (ref 8–23)
CO2: 31 mmol/L (ref 22–32)
Calcium: 8 mg/dL — ABNORMAL LOW (ref 8.9–10.3)
Chloride: 94 mmol/L — ABNORMAL LOW (ref 98–111)
Creatinine, Ser: 1.04 mg/dL — ABNORMAL HIGH (ref 0.44–1.00)
GFR, Estimated: 54 mL/min — ABNORMAL LOW (ref >60)
Glucose, Bld: 100 mg/dL — ABNORMAL HIGH (ref 70–99)
Potassium: 3.9 mmol/L (ref 3.5–5.1)
Sodium: 133 mmol/L — ABNORMAL LOW (ref 135–145)

## 2022-02-02 MED ORDER — ALPRAZOLAM 0.25 MG PO TABS: 0.1250 mg | ORAL_TABLET | Freq: Three times a day (TID) | ORAL | 0 refills | Status: AC | PRN

## 2022-02-02 MED ORDER — EMPAGLIFLOZIN 10 MG PO TABS
10.0000 mg | ORAL_TABLET | Freq: Every day | ORAL | 0 refills | Status: AC
  Filled 2022-02-02: qty 30, 30d supply, fill #0

## 2022-02-02 MED ORDER — ORAL CARE MOUTH RINSE: 15.0000 mL | OROMUCOSAL | Status: DC | PRN

## 2022-02-02 MED ORDER — FUROSEMIDE 40 MG PO TABS
40.0000 mg | ORAL_TABLET | Freq: Every day | ORAL | Status: DC
  Administered 2022-02-03: 40 mg via ORAL
  Filled 2022-02-02: qty 1

## 2022-02-02 MED ORDER — FUROSEMIDE 40 MG PO TABS
40.0000 mg | ORAL_TABLET | Freq: Every day | ORAL | 0 refills | Status: DC
  Filled 2022-02-02: qty 30, 30d supply, fill #0

## 2022-02-02 MED ORDER — NITROGLYCERIN 0.4 MG SL SUBL
SUBLINGUAL_TABLET | SUBLINGUAL | Status: AC
  Filled 2022-02-02: qty 1

## 2022-02-02 MED ORDER — NITROGLYCERIN 0.4 MG SL SUBL
0.4000 mg | SUBLINGUAL_TABLET | SUBLINGUAL | Status: DC | PRN
Start: 2022-02-02 — End: 2022-02-03
  Administered 2022-02-02: 0.4 mg via SUBLINGUAL

## 2022-02-02 NOTE — NC FL2 (Signed)
[place] MEDICAID FL2 LEVEL OF CARE SCREENING TOOL     IDENTIFICATION  Patient Name: Lindsey Pollard Birthdate: 1941-06-11 Sex: female Admission Date (Current Location): 01/29/2022  [facility] and [place] Number:  Producer, television/film/video and Address:  [facility] [place], [address], [place], [place] [address]      Provider Number: [id number]  Attending Physician Name and Address:  Coralie Keens,*  Relative Name and Phone Number:       Current Level of Care: Hospital Recommended Level of Care: Skilled Nursing Facility Prior Approval Number:    Date Approved/Denied: 02/02/22 PASRR Number: [id number]  Discharge Plan: SNF    Current Diagnoses: Patient Active Problem List   Diagnosis Date Noted   Pressure injury of skin 02/01/2022   Acute on chronic systolic (congestive) heart failure (HCC) 01/29/2022   Fever and chills 01/17/2022   Malnutrition of moderate degree 01/11/2022   Overweight (BMI 25.0-29.9) 01/06/2022   Unstable angina (HCC) 12/07/2021   NSTEMI (non-ST elevated myocardial infarction) (HCC) 12/06/2021   Chronic kidney disease, stage 3a (HCC) 12/06/2021   Lung nodule 12/06/2021   Type II diabetes mellitus with renal manifestations (HCC) 12/06/2021   HTN (hypertension) 12/06/2021   HLD (hyperlipidemia) 12/06/2021   AKI (acute kidney injury) (HCC) in the setting of stage IIIa chronic    Anemia    Acute respiratory failure with hypoxia (HCC)    Chronic diastolic CHF (congestive heart failure) (HCC)    Fungal infection of the groin    Fungal infection of skin    Essential hypertension 06/01/2021   Carotid stenosis 10/26/2020   Atherosclerosis of native arteries of extremity with intermittent claudication (HCC) 09/16/2020   Carotid bruit present 09/16/2020   Healthcare maintenance 01/06/2019   Aortic calcification (HCC) 06/19/2017   Ischemic cardiomyopathy 06/19/2017   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/17/2017   PVD (peripheral vascular disease) (HCC)  06/14/2017   Pseudophakia of right eye 11/10/2014   Bilateral hearing loss 10/05/2014   CAD (coronary artery disease) 10/05/2014   GERD (gastroesophageal reflux disease) 10/05/2014   RLS (restless legs syndrome) 09/09/2014   Type 2 diabetes mellitus with hyperlipidemia (HCC) 08/27/2014    Orientation RESPIRATION BLADDER Height & Weight     Self, Situation, Time, Place  O2 (2L [place]) Incontinent, External catheter Weight: 125 lb 1.6 oz (56.7 kg) Height:  4\' 11"  (149.9 cm)  BEHAVIORAL SYMPTOMS/MOOD NEUROLOGICAL BOWEL NUTRITION STATUS      Incontinent Diet (see d/c summary)  AMBULATORY STATUS COMMUNICATION OF NEEDS Skin   Extensive Assist Verbally PU Stage and Appropriate Care                       Personal Care Assistance Level of Assistance  Bathing, Feeding, Dressing Bathing Assistance: Maximum assistance Feeding assistance: Independent Dressing Assistance: Maximum assistance     Functional Limitations Info  Sight, Hearing, Speech Sight Info: Impaired Hearing Info: Impaired Speech Info: Adequate    SPECIAL CARE FACTORS FREQUENCY  PT (By licensed PT), OT (By licensed OT)     PT Frequency: 5x/week OT Frequency: 5x/week            Contractures Contractures Info: Not present    Additional Factors Info  Code Status, Allergies Code Status Info: DNR Allergies Info: Tetanus Toxoids, PCN G           Current Medications (02/02/2022):  This is the current hospital active medication list Current Facility-Administered Medications  Medication Dose Route Frequency Provider Last  Rate Last Admin   acetaminophen (TYLENOL) tablet 650 mg  650 mg Oral Q6H PRN Karmen Bongo, MD       Or   acetaminophen (TYLENOL) suppository 650 mg  650 mg Rectal Q6H PRN Karmen Bongo, MD       albuterol (PROVENTIL) (2.5 MG/3ML) 0.083% nebulizer solution 3 mL  3 mL Inhalation Q4H PRN Karmen Bongo, MD       ALPRAZolam Duanne Moron) tablet 0.125 mg  0.125 mg Oral TID PRN Karmen Bongo, MD    0.125 mg at 02/01/22 1755   aspirin EC tablet 81 mg  81 mg Oral QPM Karmen Bongo, MD   81 mg at 02/01/22 1754   atorvastatin (LIPITOR) tablet 80 mg  80 mg Oral QPM Karmen Bongo, MD   80 mg at 02/01/22 1754   bisacodyl (DULCOLAX) EC tablet 5 mg  5 mg Oral Daily PRN Karmen Bongo, MD       clopidogrel (PLAVIX) tablet 75 mg  75 mg Oral QPM Karmen Bongo, MD   75 mg at 02/01/22 1754   docusate sodium (COLACE) capsule 100 mg  100 mg Oral BID Karmen Bongo, MD   100 mg at 02/02/22 1002   empagliflozin (JARDIANCE) tablet 10 mg  10 mg Oral Daily Arrien, Jimmy Picket, MD   10 mg at 02/02/22 1002   enoxaparin (LOVENOX) injection 30 mg  30 mg Subcutaneous Q24H Karmen Bongo, MD   30 mg at 02/01/22 1551   feeding supplement (ENSURE ENLIVE / ENSURE PLUS) liquid 237 mL  237 mL Oral BID BM Domenic Polite, MD   237 mL at 02/02/22 1007   furosemide (LASIX) injection 40 mg  40 mg Intravenous BID Karmen Bongo, MD   40 mg at 02/02/22 1003   hydrALAZINE (APRESOLINE) injection 5 mg  5 mg Intravenous Q4H PRN Karmen Bongo, MD   5 mg at 01/29/22 1604   ipratropium-albuterol (DUONEB) 0.5-2.5 (3) MG/3ML nebulizer solution 3 mL  3 mL Nebulization Q6H PRN Karmen Bongo, MD       metoprolol succinate (TOPROL-XL) 24 hr tablet 25 mg  25 mg Oral Daily Karmen Bongo, MD   25 mg at 02/02/22 1002   ondansetron (ZOFRAN) tablet 4 mg  4 mg Oral Q6H PRN Karmen Bongo, MD       Or   ondansetron [facility]) injection 4 mg  4 mg Intravenous Q6H PRN Karmen Bongo, MD   4 mg at 01/29/22 1604   Oral care mouth rinse  15 mL Mouth Rinse PRN Arrien, Jimmy Picket, MD       oxyCODONE (Oxy IR/ROXICODONE) immediate release tablet 5 mg  5 mg Oral Q4H PRN Karmen Bongo, MD       pantoprazole (PROTONIX) EC tablet 40 mg  40 mg Oral QPM Karmen Bongo, MD   40 mg at 02/01/22 1755   polyethylene glycol (MIRALAX / GLYCOLAX) packet 17 g  17 g Oral Daily PRN Karmen Bongo, MD       sodium chloride flush (NS) 0.9 %  injection 3 mL  3 mL Intravenous Q12H Karmen Bongo, MD   3 mL at 02/02/22 1003   spironolactone (ALDACTONE) tablet 12.5 mg  12.5 mg Oral q morning Karmen Bongo, MD   12.5 mg at 02/02/22 1002   traZODone (DESYREL) tablet 25 mg  25 mg Oral QHS PRN Karmen Bongo, MD         Discharge Medications: Please see discharge summary for a list of discharge medications.  Relevant Imaging Results:  Relevant Lab Results:  Additional Information SS #: [id number]  Azriella Mattia [address] LCSW

## 2022-02-02 NOTE — TOC Benefit Eligibility Note (Signed)
Patient Advocate Encounter  Insurance verification completed.    The patient is currently admitted and upon discharge could be taking Farxiga 10 mg.  The current 30 day co-pay is $0.00.   The patient is currently admitted and upon discharge could be taking Jardiance 10 mg.  The current 30 day co-pay is $0.00.   The patient is insured through [employer] Medicare Part D     Gatha Mcnulty, CPhT Pharmacy Patient Advocate Specialist [facility] Patient Advocate Team Direct Number: [phone]  Fax: [phone]        

## 2022-02-02 NOTE — Progress Notes (Signed)
Patient complains about chest pain. Patient relayed 5/10 chest pain at the mid sternum. RN obtained VS and gave 1 nitroglycerin sublingual per chest pain protocol. VS stable post 5 mins. RN asked patient if patient would like the second nitrosublingual. Patient relayed to nurse "No, give me the pain med". RN gave the prn pain medication. EKG obtained and unremarkable.

## 2022-02-02 NOTE — Progress Notes (Signed)
Mobility Specialist Progress Note:   02/02/22 1212  Mobility  Activity Transferred from bed to chair  Level of Assistance Contact guard assist, steadying assist  Assistive Device  (HHA)  Distance Ambulated (ft) 4 ft  Activity Response Tolerated well  $Mobility charge 1 Mobility   Pt received in bed willing to get to chair for lunch. No complaints of pain. Left in chair with call bell in reach and all needs met.   [facility] Mellie Buccellato Mobility Specialist

## 2022-02-02 NOTE — Progress Notes (Signed)
Daily Progress Note   Patient Name: Lindsey Pollard       Date: 02/02/2022 DOB: Apr 12, 1941  Age: 81 y.o. MRN#: [id number] Attending Physician: Coralie Keens Primary Care Physician: Lauro Regulus, MD Admit Date: 01/29/2022  Reason for Consultation/Follow-up: Establishing goals of care  Subjective: Chart review performed including progress notes, labs  Patient assessed at the bedside.  She is sitting in bedside chair wanting to return to bed.  Discussed with RN.  Assisted with repositioning of her phone.  No family present during my visit.  PMT received phone calls today from patient's nephew Lindsey Pollard and daughter Lindsey Pollard regarding plans for possible discharge.  I called both family members back and we discussed their concerns about patient readiness and safety, potential for further decompensation and relapse of symptoms given readmission after last hospital stay.  Reassured of the processes in place to ensure patient stability such as instructions for diuretic dose adjustment if she were to gain weight rapidly.  Ultimately they would agree with discharge today if primary attending felt it was safe, but they wanted to ensure all members of the care team were aware of their concerns.  Offered to advocate and discuss further with attending and [facility] team.  Lindsey Pollard feels it is in patient's best interest to have the best possible chance for success at strengthening/rehabilitation when she returns to [facility] so that she can tolerate transportation back to [place].  Family has a good understanding of the severity of patient's illness and that anything can happen at any time.  Encouraged to call with questions and/or concerns.  PMT will continue to support holistically  Length of Stay: 4           Nursing notes and vital signs reviewed. General: Elderly Caucasian female in no acute distress Respiratory: normal effort without respiratory distress Cardiac: Normal rate Neuro: Alert and oriented Psych: Cooperative   Vital Signs: BP (!) 121/40 (BP Location: Right Arm)   Pulse 69   Temp 97.8 F (36.6 C) (Oral)   Resp 20   Ht 4\' 11"  (1.499 m)   Wt 56.7 kg   SpO2 100%   BMI 25.27 kg/m  SpO2: SpO2: 100 % O2 Device: O2 Device: Nasal Cannula O2 Flow Rate: O2 Flow Rate (L/min): 2 L/min       Palliative Assessment/Data:  PPS 40%     Palliative Care Assessment & Plan   Patient Profile: 81 y.o. female  with past medical history of  recent Vfib arrest, chronic systolic CHF, CAD s/p CABG and PCI, stage 3 CKD, and DM presented to ED on 01/29/22 from [facility] with respiratory distress. On arrival to ED she was placed on BiPap. Of note, patient was recently hospitalized from 7/20-01/23/22 for STEMI and multisystem organ failure including VDRF. She was extubated on 01/16/22 and eventually discharged to SNF with palliative care.  Patient was admitted on 01/29/2022 with acute on chronic systolic CHF, AKI on stage 3a CKD, CAD/recent arrest.    Patient was seen by PMT during previous hospitalization where she indicated during MOST form completion for DNR, comfort measures, determine use or limitation of antibiotics if infection occurs, IVF trial period, no feeding tube.   Patient has had 3 admissions and 1 ED visit in the last 6 months; she is a 7 day readmission.  Assessment: Principal Problem:   Acute on chronic systolic (congestive) heart failure (HCC) Active Problems:   CAD (coronary artery disease)   AKI (acute kidney injury) (HCC) in the setting of stage IIIa chronic   Type II diabetes mellitus with renal manifestations (HCC)   HTN (hypertension)   Pressure injury of skin   Recommendations/Plan: Continue DNR Continue full scope of treatment otherwise Patient remains  appropriate for discharge to SNF with palliative care follow-up; ultimate goal is for transfer to LTC in [place] to be near her daughter for enrollment in hospice Discussed family concerns with the care team, will discharge tomorrow per attending if she remains stable PMT remains available as needed.  Please secure chat or call team line should urgent needs arise   Prognosis:  Unable to determine  Discharge Planning: Skilled Nursing Facility for rehab with Palliative care service follow-up   Total time: I spent 35 minutes in the care of the patient today in the above activities and documenting the encounter.   Richardson Dopp, PA-C Palliative Medicine Team Team phone # [phone]  Thank you for allowing the Palliative Medicine Team to assist in the care of this patient. Please utilize secure chat with additional questions, if there is no response within 30 minutes please call the above phone number.  Palliative Medicine Team providers are available by phone from 7am to 7pm daily and can be reached through the team cell phone.  Should this patient require assistance outside of these hours, please call the patient's attending physician.

## 2022-02-02 NOTE — TOC Progression Note (Signed)
Transition of Care [facility]) - Progression Note    Patient Details  Name: Lindsey Pollard MRN: [id number] Date of Birth: 06/26/1940  [facility] CM/SW Contact  Erin Sons, [place] Phone Number: 02/02/2022, 1:19 PM  Clinical Narrative:     [facility]. CSW notified pt's daughter. Then received call from pt's nephew who expressed concerns about pt's readiness to DC. CSW notified MD and MD plans to speak with nephew.   MD spoke with pt and pt's nephew and updated CSW that pt will DC tomorrow.   [employer] notified. Auth expires after today so new SNF auth submitted to admit pt tomorrow; Status pending.   Expected Discharge Plan: Skilled Nursing Facility Barriers to Discharge: Continued Medical Work up  Expected Discharge Plan and Services Expected Discharge Plan: Skilled Nursing Facility         Expected Discharge Date: 02/02/22                                     Social Determinants of Health (SDOH) Interventions    Readmission Risk Interventions    01/18/2022    9:04 PM 01/05/2022    5:09 PM  Readmission Risk Prevention Plan  Transportation Screening Complete Complete  PCP or Specialist Appt within 3-5 Days  Complete  Social Work Consult for Recovery Care Planning/Counseling  Not Complete  SW consult not completed comments  NA  Palliative Care Screening  Not Applicable  Medication Review [employer]) Referral to Pharmacy Referral to Pharmacy  PCP or Specialist appointment within 3-5 days of discharge Complete   HRI or Home Care Consult Complete   SW Recovery Care/Counseling Consult Not Complete   SW Consult Not Complete Comments NA   Palliative Care Screening Not Applicable   Skilled Nursing Facility Complete

## 2022-02-02 NOTE — Progress Notes (Signed)
Patient called out to the nurses station saying she does not feel good. VS obtained and stable, RN asked if the patient is feeling anxious. Patient has one time occurrence of vomit during the dinner meal. Prn dose of xanax given.

## 2022-02-02 NOTE — Telephone Encounter (Signed)
Pharmacy Patient Advocate Encounter  Insurance verification completed.    The patient is insured through [employer] Medicare Part D   The patient is currently admitted and ran test claims for the following: Farxiga, Jardiance.  Copays and coinsurance results were relayed to Inpatient clinical team.  

## 2022-02-02 NOTE — Discharge Summary (Signed)
Physician Discharge Summary   Patient: Lindsey Pollard MRN: [id number] DOB: July 16, 1940  Admit date:     01/29/2022  Discharge date: 02/03/22  Discharge Physician: Lindsey Pollard   PCP: Lauro Regulus, MD   Recommendations at discharge:    Patient will continue diuresis with furosemide, spironolactone and empagliflozin.  Continue with palliative care as outpatient with possible transition to hospice.  Continue furosemide 40 mg daily, increase to 40 mg bid in case of weight gain 2 to 3 lbs in 24 hrs and 5 lbs in 7 days.   I spoke over the phone with the patient's nephew about patient's  condition, plan of care, prognosis and all questions were addressed.   Discharge Diagnoses: Principal Problem:   Acute on chronic systolic (congestive) heart failure (HCC) Active Problems:   AKI (acute kidney injury) (HCC) in the setting of stage IIIa chronic   CAD (coronary artery disease)   Type II diabetes mellitus with renal manifestations (HCC)   HTN (hypertension)   Pressure injury of skin  Resolved Problems:   * No resolved hospital problems. [facility] Course: Lindsey Pollard was admitted to the hospital with the working diagnosis of decompensated heart failure.   81 yo female with the past medical history of heart failure, hx of ventricular fibrillation cardiac arrest, coronary artery disease sp CABG, CKD sage 3, and T2DM. He was admitted to [facility] on 07/20 to 01/23/22 for NSTEMI. Then he required mechanical ventilation, he was eventually liberated from the ventilator and was discharged to SNF with palliative care. Patient had severe dyspnea and was transported back to the hospital. On her initial physical examination she was in respiratory distress and was placed on non invasive mechanical ventilation. Blood pressure 114/51, HR 103, RR 33 and 02 saturation 97%, lungs with poor air movement, increased work of breathing on Bipap, heart with S1 and S2 present and rhythm, abdomen not distended  and no lower extremity edema.   Na 137, K 4.5 Cl 94, bicarbonate 27, glucose 217, bun 55 cr 1,50  BNP 1,815  High sensitive troponin 30  Wbc 13,6 hgb 9,1 plt 365   Chest radiograph with cardiomegaly, bilateral hilar vascular congestion with cephalization of the vasculature, bilateral pleural effusions.   EKG 103 bpm, normal axis, 1st degree AV block, sinus rhythm with no significant ST segment or T wave changes, poor R R wave progression.   Patient was placed on diuretic therapy with improvement in her symptoms.  Patient will continue physical therapy as outpatient at SNF.    Assessment and Plan: * Acute on chronic systolic (congestive) heart failure (HCC) Echocardiogram with mild reduced LV systolic function with EF 45 to 50%, global hypokinesis, LV internal cavity with mild dilatation, RV systolic function preserved.   Patient with improvement in volume status. Her blood pressure systolic is 114 to 127 mmHg,   Plan to continue diuresis with oral furosemide, spironolactone and empagliflozin.  Continue with metoprolol Holding on ARB or ace Inh due to risk of hypotension.  Plan for follow up as outpatient.   AKI (acute kidney injury) (HCC) in the setting of stage IIIa chronic Hyponatremia, hyperkalemia.  Patient has responded well to diuresis, her renal function has a serum cr of 1,0 with K at 3,9 and serum bicarbonate at 31. Na is 133.   Plan to continue diuresis with furosemide, spironolactone and empagliflozin.  Follow up renal function and electrolytes as outpatient.   CAD (coronary artery disease) No chest pain. Continue with statin,  dual antiplatelet therapy and metoprolol.   Type II diabetes mellitus with renal manifestations ([place]) Her glucose remained well controlled during her hospitalization.   Dyslipidemia., continue with statin therapy.   HTN (hypertension) Continue blood pressure control with furosemide, spironolactone and carvedilol.  Diuresis with  empagliflozin   Pressure injury of skin  Active Pressure Injury/Wound(s)     Pressure Ulcer  Duration          Pressure Injury 01/29/22 Sacrum Mid;Left Stage 2 -  Partial thickness loss of dermis presenting as a shallow open injury with a red, pink wound bed without slough. 2 days             Continue local wound care.   Moderate calorie protein malnutrition. Continue with nutritional supplementation.       Consultants: none  Procedures performed: none   Disposition: Skilled nursing facility Diet recommendation:  Cardiac and Carb modified diet DISCHARGE MEDICATION: Allergies as of 02/02/2022       Reactions   Tetanus Toxoids Swelling   Tetanus and diphtheria toxids   Penicillin G Rash        Medication List     STOP taking these medications    isosorbide mononitrate 60 MG 24 hr tablet Commonly known as: IMDUR   lisinopril 10 MG tablet Commonly known as: ZESTRIL   torsemide 10 MG tablet Commonly known as: DEMADEX       TAKE these medications    acetaminophen 325 MG tablet Commonly known as: TYLENOL Take 2 tablets (650 mg total) by mouth every 4 (four) hours as needed for mild pain (temp > 101.5).   albuterol 108 (90 Base) MCG/ACT inhaler Commonly known as: VENTOLIN HFA Inhale 1-2 puffs into the lungs every 4 (four) hours as needed for shortness of breath.   ALPRAZolam 0.25 MG tablet Commonly known as: XANAX Take 0.5 tablets (0.125 mg total) by mouth 3 (three) times daily as needed for anxiety.   aspirin EC 81 MG tablet Take 81 mg by mouth every evening.   atorvastatin 80 MG tablet Commonly known as: LIPITOR Take 80 mg by mouth every evening.   Cholecalciferol 25 MCG (1000 UT) capsule Take 1,000 Units by mouth 2 (two) times daily.   clopidogrel 75 MG tablet Commonly known as: PLAVIX Take 1 tablet (75 mg total) by mouth daily. What changed: when to take this   docusate sodium 100 MG capsule Commonly known as: COLACE Take 1 capsule  (100 mg total) by mouth 2 (two) times daily as needed for mild constipation.   empagliflozin 10 MG Tabs tablet Commonly known as: JARDIANCE Take 1 tablet (10 mg total) by mouth daily. Start taking on: February 03, 2022   feeding supplement Liqd Take 237 mLs by mouth 3 (three) times daily between meals.   furosemide 40 MG tablet Commonly known as: LASIX Take 1 tablet (40 mg total) by mouth daily. Start taking on: February 03, 2022   ipratropium-albuterol 0.5-2.5 (3) MG/3ML Soln Commonly known as: DUONEB Take 3 mLs by nebulization every 6 (six) hours as needed (shortness of breath).   loperamide 2 MG capsule Commonly known as: IMODIUM Take 1 capsule (2 mg total) by mouth as needed for diarrhea or loose stools. What changed: when to take this   metoprolol succinate 25 MG 24 hr tablet Commonly known as: TOPROL-XL Take 1 tablet (25 mg total) by mouth daily.   multivitamin with minerals Tabs tablet Take 1 tablet by mouth every evening.   nitroGLYCERIN 2 % ointment Commonly  known as: NITROGLYN Apply 0.5 inches topically every 6 (six) hours as needed for chest pain.   ondansetron 4 MG disintegrating tablet Commonly known as: ZOFRAN-ODT Take 4 mg by mouth every 8 (eight) hours as needed for nausea or vomiting.   pantoprazole 40 MG tablet Commonly known as: PROTONIX Take 1 tablet (40 mg total) by mouth daily. What changed: when to take this   polyethylene glycol 17 g packet Commonly known as: MIRALAX / GLYCOLAX Take 17 g by mouth daily as needed for moderate constipation.   spironolactone 25 MG tablet Commonly known as: ALDACTONE Take 12.5 mg by mouth every morning.        Discharge Exam: Filed Weights   01/31/22 0511 02/01/22 0350 02/02/22 0430  Weight: 60.1 kg 57.9 kg 56.7 kg   BP (!) 127/43   Pulse 68   Temp 97.6 F (36.4 C) (Oral)   Resp 20   Ht 4\' 11"  (1.499 m)   Wt 56.7 kg   SpO2 100%   BMI 25.27 kg/m   Patient with improvement of her symptoms, continue  to have intermittent anxiety  Neurology awake and alert ENT with mild pallor Cardiovascular with S1 and S2 present and rhythmic with no gallops Respiratory with no rales or wheezing Abdomen with no distention  No lower extremity edema   Condition at discharge: stable  The results of significant diagnostics from this hospitalization (including imaging, microbiology, ancillary and laboratory) are listed below for reference.   Imaging Studies: DG Chest Portable 1 View  Result Date: 01/29/2022 CLINICAL DATA:  dyspnea EXAM: PORTABLE CHEST 1 VIEW COMPARISON:  January 22, 2022 FINDINGS: Again seen are sternotomy wires and CABG changes. Cardiomediastinal silhouette is prominent, stable. Bilateral pleural effusion greater on the left. There has been interval significant improvement of the pulmonary edema. There is mild-to-moderate pulmonary vascular congestion seen. The visualized skeletal structures are unremarkable. IMPRESSION: 1. Mild-to-moderate bilateral pleural effusion greater on the left without significant interval change. 2. There has been significant resolution of the pulmonary edema. Presently, there is mild-to-moderate pulmonary vascular congestion seen. Electronically Signed   By: January 24, 2022 M.D.   On: 01/29/2022 10:43   DG Chest Port 1 View  Result Date: 01/22/2022 CLINICAL DATA:  Shortness of breath EXAM: PORTABLE CHEST 1 VIEW COMPARISON:  Chest x-ray dated January 17, 2022 FINDINGS: Cardiac and mediastinal contours are unchanged, status post CABG. Increased left-greater-than-right heterogeneous pulmonary opacities. Moderate left and small right pleural effusions. Evidence of pneumothorax. IMPRESSION: 1. Increased left-greater-than-right heterogeneous pulmonary opacities, likely due to worsened pulmonary edema. 2. Moderate left and small right pleural effusions, similar in size when compared with prior exam. Electronically Signed   By: January 19, 2022 M.D.   On: 01/22/2022 12:14   DG Chest  Port 1 View  Result Date: 01/17/2022 CLINICAL DATA:  Fever, shortness of breath EXAM: PORTABLE CHEST 1 VIEW COMPARISON:  Previous studies including the examination of 01/15/2022 FINDINGS: Transverse diameter of 01/17/2022 is increased. Central pulmonary vessels are prominent. Increased interstitial markings are seen in the parahilar regions. Small to moderate bilateral pleural effusions are seen, more so on the left side with interval increase. Evaluation of left lower lung field for infiltrates is limited by the effusion. There is no pneumothorax. There is interval removal of endotracheal tube, central venous catheter and enteric tube. IMPRESSION: Central pulmonary vessels are prominent with increased interstitial markings in the parahilar regions suggesting CHF. Small to moderate bilateral pleural effusions, more so on the left side with interval increase. Electronically  Signed   By: Elmer Picker M.D.   On: 01/17/2022 17:38   DG Chest Port 1 View  Result Date: 01/15/2022 CLINICAL DATA:  Respiratory failure EXAM: PORTABLE CHEST 1 VIEW COMPARISON:  Prior chest x-ray 11/07/2041 FINDINGS: Endotracheal tube present. The tip is 4.7 cm above the carina. Left IJ approach central venous catheter with the catheter tip at the cavoatrial junction. A gastric tube is present. The proximal side-hole is visualized over the gastric bubble. Patient is status post median sternotomy with evidence of multivessel CABG. Sternal fixation hardware in place. Cardiac and mediastinal contours are within normal limits. Increased pulmonary vascular congestion now with mild interstitial edema. Probable small bilateral pleural effusions with associated bibasilar atelectasis. No pneumothorax. No acute osseous abnormality. IMPRESSION: 1. Stable and satisfactory support apparatus. 2. Increased pulmonary vascular congestion now with mild interstitial edema. 3. Small bilateral layering pleural effusions. Electronically Signed   By: Jacqulynn Cadet M.D.   On: 01/15/2022 10:15   ECHOCARDIOGRAM LIMITED  Result Date: 01/12/2022    ECHOCARDIOGRAM LIMITED REPORT   Patient Name:   SYRITA RADELL [facility] Date of Exam: 01/12/2022 Medical Rec #:  [id number]     Height:       59.0 in Accession #:    [id number]    Weight:       140.4 lb Date of Birth:  1941-05-04     BSA:          1.587 m Patient Age:    33 years      BP:           146/47 mmHg Patient Gender: F             HR:           51 bpm. Exam Location:  [facility] Procedure: Limited Echo, Cardiac Doppler, Color Doppler and Intracardiac            Opacification Agent Indications:     Elevated Troponin  History:         Patient has prior history of Echocardiogram examinations, most                  recent 01/04/2022.  Sonographer:     Sherrie Sport Referring Phys:  [id number] [place] TANG Diagnosing Phys: Yolonda Kida MD  Sonographer Comments: Suboptimal parasternal window and suboptimal apical window. IMPRESSIONS  1. Left ventricular ejection fraction, by estimation, is 45 to 50%. The left ventricle has mildly decreased function. The left ventricle demonstrates global hypokinesis. The left ventricular internal cavity size was mildly dilated. Left ventricular diastolic function could not be evaluated.  2. Right ventricular systolic function is normal.  3. Moderate pleural effusion in the left lateral region.  4. The mitral valve is normal in structure.  5. The aortic valve is normal in structure. Aortic valve regurgitation is not visualized. Conclusion(s)/Recommendation(s): Poor windows for evaluation of left ventricular function by transthoracic echocardiography. Would recommend an alternative means of evaluation. FINDINGS  Left Ventricle: Left ventricular ejection fraction, by estimation, is 45 to 50%. The left ventricle has mildly decreased function. The left ventricle demonstrates global hypokinesis. Definity contrast agent was given IV to delineate the left ventricular  endocardial borders. The left  ventricular internal cavity size was mildly dilated. There is no left ventricular hypertrophy. Left ventricular diastolic function could not be evaluated. Right Ventricle: No increase in right ventricular wall thickness. Right ventricular systolic function is normal. Left Atrium: Left atrial size was normal in size. Right Atrium: Right  atrial size was normal in size. Mitral Valve: The mitral valve is normal in structure. Tricuspid Valve: The tricuspid valve is normal in structure. Aortic Valve: The aortic valve is normal in structure. Aortic valve regurgitation is not visualized. Pulmonic Valve: The pulmonic valve was normal in structure. Aorta: The ascending aorta was not well visualized. IAS/Shunts: No atrial level shunt detected by color flow Doppler. Additional Comments: There is a moderate pleural effusion in the left lateral region. LEFT VENTRICLE PLAX 2D LVIDd:         4.70 cm LVIDs:         3.40 cm LV PW:         1.10 cm LV IVS:        0.90 cm LVOT diam:     2.00 cm LVOT Area:     3.14 cm  LEFT ATRIUM         Index LA diam:    3.00 cm 1.89 cm/m   AORTA Ao Root diam: 2.90 cm TRICUSPID VALVE TR Peak grad:   28.9 mmHg TR Vmax:        269.00 cm/s  SHUNTS Systemic Diam: 2.00 cm Yolonda Kida MD Electronically signed by Yolonda Kida MD Signature Date/Time: 01/12/2022/2:52:11 PM    Final    DG Chest Port 1 View  Result Date: 01/11/2022 CLINICAL DATA:  Central line placement. EXAM: PORTABLE CHEST 1 VIEW COMPARISON:  Radiograph and CT earlier today. FINDINGS: New left internal jugular central venous catheter tip overlies the atrial caval junction. No pneumothorax. Stable endotracheal and enteric tubes. Stable heart size and mediastinal contours. Small bilateral pleural effusions, increasing on the left. No pulmonary edema or new airspace disease. IMPRESSION: 1. New left internal jugular central venous catheter with tip over the atrial caval junction. No pneumothorax. 2. Stable endotracheal and enteric  tubes. 3. Small bilateral pleural effusions, increasing on the left. Electronically Signed   By: Keith Rake M.D.   On: 01/11/2022 18:41   CT HEAD WO CONTRAST (5MM)  Result Date: 01/11/2022 CLINICAL DATA:  Altered mental status EXAM: CT HEAD WITHOUT CONTRAST TECHNIQUE: Contiguous axial images were obtained from the base of the skull through the vertex without intravenous contrast. RADIATION DOSE REDUCTION: This exam was performed according to the departmental dose-optimization program which includes automated exposure control, adjustment of the mA and/or kV according to patient size and/or use of iterative reconstruction technique. COMPARISON:  01/03/2022 FINDINGS: Brain: No acute intracranial findings are seen in noncontrast CT brain. There are no signs of bleeding within the cranium. Cortical sulci are prominent. Vascular: Unremarkable. Skull: Unremarkable Sinuses/Orbits: Unremarkable. Other: None. IMPRESSION: No acute intracranial findings are seen.  Atrophy. Electronically Signed   By: Elmer Picker M.D.   On: 01/11/2022 08:28   CT CHEST ABDOMEN PELVIS WO CONTRAST  Result Date: 01/11/2022 CLINICAL DATA:  Shortness of breath, altered mental status EXAM: CT CHEST, ABDOMEN AND PELVIS WITHOUT CONTRAST TECHNIQUE: Multidetector CT imaging of the chest, abdomen and pelvis was performed following the standard protocol without IV contrast. RADIATION DOSE REDUCTION: This exam was performed according to the departmental dose-optimization program which includes automated exposure control, adjustment of the mA and/or kV according to patient size and/or use of iterative reconstruction technique. COMPARISON:  01/03/2022 FINDINGS: CT CHEST FINDINGS Cardiovascular: Extensive coronary artery calcifications are seen. There is evidence of previous coronary bypass surgery. Scattered calcifications are seen in thoracic aorta and its major branches. Mediastinum/Nodes: Slightly enlarged lymph nodes are seen in  mediastinum with no significant interval  change. Tip of endotracheal tube is at the carina and should be pulled back 2-3 cm. Lungs/Pleura: Small patchy infiltrates are seen in left parahilar region and posterior lower lung fields. There is interval appearance of small bilateral pleural effusions. There is no pneumothorax. There is slight thickening of interlobular septi. Musculoskeletal: No acute findings are seen. CT ABDOMEN PELVIS FINDINGS Hepatobiliary: No focal abnormalities are seen in liver. There is no dilation of bile ducts. Gallbladder is distended, possibly due to fasting state. There is no wall thickening in gallbladder. Gallbladder stones are seen. Pancreas: No focal abnormalities are seen. Diverticulum is noted along the inner margin of second portion of duodenum. Spleen: Unremarkable. Adrenals/Urinary Tract: Adrenals are unremarkable. Left kidney is much smaller than right. This may be a congenital variation or suggest renal artery stenosis or chronic pyelonephritis. There is no hydronephrosis. There are no renal or ureteral stones. Foley catheter is seen in the bladder. Stomach/Bowel: Tip of enteric tube is seen in the stomach. There is no dilation of small bowel loops. Appendix is unremarkable. There is no significant wall thickening in colon. Scattered diverticula are seen in the colon without signs of focal acute diverticulitis. Vascular/Lymphatic: Extensive arterial calcifications are seen. Reproductive: Coarse calcifications seen in pelvis may suggest calcified uterine fibroids. Other: There is no ascites or pneumoperitoneum. Musculoskeletal: Degenerative changes are noted in lumbar spine, particularly severe at L2-L3 and L3-L4 levels. This finding has not changed significantly. IMPRESSION: Tip of endotracheal tube is at the carina and should be pulled back 2-3 cm. There is interval appearance of small bilateral pleural effusions, more so on the left side. There is thickening of interlobular  septi suggesting possible interstitial pulmonary edema. There are patchy densities in the left parahilar region and both lower lung fields which may be due to pulmonary edema or pneumonia. Extensive coronary artery calcifications are seen. There is no evidence of intestinal obstruction or pneumoperitoneum. There is no hydronephrosis. Gallbladder stones. Diverticulosis of colon without signs of diverticulitis. Other findings as described in the body of the report. Electronically Signed   By: Ernie AvenaPalani  Rathinasamy M.D.   On: 01/11/2022 08:27   DG Abd Portable 1 View  Result Date: 01/11/2022 CLINICAL DATA:  OG tube placement. EXAM: PORTABLE ABDOMEN - 1 VIEW COMPARISON:  AP chest 01/11/2022 at 704 hours FINDINGS: New orogastric tube with side port overlying the proximal stomach and tip overlying the mid aspect of the greater curvature. Nonobstructed upper abdominal bowel-gas pattern. No portal venous gas or pneumatosis. Severe left L3-4 disc space narrowing and moderate to severe diffuse L4-5 disc space narrowing. Mild dextrocurvature centered at L3-4. IMPRESSION: Orogastric tube in appropriate position. Electronically Signed   By: Neita Garnetonald  Viola M.D.   On: 01/11/2022 08:11   DG Chest Portable 1 View  Result Date: 01/11/2022 CLINICAL DATA:  Tube placement.  Encounter for intubation. EXAM: PORTABLE CHEST 1 VIEW COMPARISON:  AP chest 01/11/2022 at 704 hours FINDINGS: AP chest 01/11/2022 at 735 hours. Interval placement of endotracheal tube with tip terminating imaging of the thoracic inlet and the carina proximally 3.2 cm above the carina. Enteric tube descends below the diaphragm with the side port overlying the proximal stomach and the tip excluded by inferior collimation, new from prior. Postsurgical changes are again seen of median sternotomy and CABG. Cardiac silhouette and mediastinal contours are within normal limits. Moderate calcification within the aortic arch. Mild left basilar linear likely subsegmental  atelectasis. No definite pleural effusion. No pneumothorax is seen. No acute skeletal abnormality. IMPRESSION: 1.  New endotracheal tube and enteric tube in appropriate position. 2. No acute lung process. 3.  Aortic Atherosclerosis (ICD10-I70.0). Electronically Signed   By: Yvonne Kendall M.D.   On: 01/11/2022 08:10   DG Chest Portable 1 View  Result Date: 01/11/2022 CLINICAL DATA:  Provided history: New onset dyspnea, recent cardiac event. EXAM: PORTABLE CHEST 1 VIEW COMPARISON:  Prior chest radiographs 01/04/2022 and earlier. FINDINGS: Prior median sternotomy/CABG. Heart size within normal limits. Aortic atherosclerosis. No appreciable airspace consolidation or pulmonary edema. No evidence of pleural effusion or pneumothorax. No acute bony abnormality identified. IMPRESSION: No evidence of acute cardiopulmonary abnormality. Aortic Atherosclerosis (ICD10-I70.0). Electronically Signed   By: Kellie Simmering D.O.   On: 01/11/2022 08:03   ECHOCARDIOGRAM LIMITED  Result Date: 01/04/2022    ECHOCARDIOGRAM LIMITED REPORT   Patient Name:   [facility] Date of Exam: 01/04/2022 Medical Rec #:  [id number]     Height:       59.0 in Accession #:    [id number]    Weight:       127.4 lb Date of Birth:  07/10/1940     BSA:          1.523 m Patient Age:    47 years      BP:           128/50 mmHg Patient Gender: F             HR:           54 bpm. Exam Location:  [facility] Procedure: Limited Echo, Color Doppler and Cardiac Doppler Indications:     Cardiac Areest I46.9  History:         Patient has prior history of Echocardiogram examinations, most                  recent 12/07/2021. Previous Myocardial Infarction,                  Signs/Symptoms:Chest Pain; Risk Factors:Diabetes and                  Hypertension.  Sonographer:     Sherrie Sport Referring Phys:  [id number] [place] TANG Diagnosing Phys: Serafina Royals MD  Sonographer Comments: Image acquisition challenging due to patient behavioral factors. IMPRESSIONS  1. The left  ventricle demonstrates regional wall motion abnormalities (see scoring diagram/findings for description).  2. Right ventricular systolic function is normal. The right ventricular size is normal.  3. Left atrial size was moderately dilated.  4. The mitral valve is normal in structure. Moderate mitral valve regurgitation.  5. Tricuspid valve regurgitation is mild to moderate.  6. The aortic valve is normal in structure. Aortic valve regurgitation is mild. FINDINGS  Left Ventricle: The left ventricle demonstrates regional wall motion abnormalities. Moderate hypokinesis of the left ventricular, mid-apical anterolateral wall. Right Ventricle: The right ventricular size is normal. No increase in right ventricular wall thickness. Right ventricular systolic function is normal. Left Atrium: Left atrial size was moderately dilated. Right Atrium: Right atrial size was normal in size. Pericardium: There is no evidence of pericardial effusion. Mitral Valve: The mitral valve is normal in structure. Moderate mitral valve regurgitation. Tricuspid Valve: The tricuspid valve is normal in structure. Tricuspid valve regurgitation is mild to moderate. Aortic Valve: The aortic valve is normal in structure. Aortic valve regurgitation is mild. Pulmonic Valve: The pulmonic valve was normal in structure. Pulmonic valve regurgitation is trivial. Aorta: The aortic root and ascending aorta are structurally normal, with  no evidence of dilitation. IAS/Shunts: No atrial level shunt detected by color flow Doppler. LEFT VENTRICLE PLAX 2D LVIDd:         4.40 cm LVIDs:         3.20 cm LV PW:         1.10 cm LV IVS:        1.00 cm LVOT diam:     2.00 cm LVOT Area:     3.14 cm  LEFT ATRIUM         Index LA diam:    3.40 cm 2.23 cm/m   AORTA Ao Root diam: 2.90 cm  SHUNTS Systemic Diam: 2.00 cm Serafina Royals MD Electronically signed by Serafina Royals MD Signature Date/Time: 01/04/2022/4:56:35 PM    Final    DG Chest 1 View  Result Date:  01/04/2022 CLINICAL DATA:  81 year old female intubated.  Post CPR. EXAM: CHEST  1 VIEW COMPARISON:  Portable chest 01/03/2022 and earlier. FINDINGS: Portable AP semi upright view at 0611 hours. Endotracheal tube tip partially obscured by sternotomy hardware, but appears to be in good position between the level the clavicles and carina. Enteric tube courses into the abdomen, tip is not included. Stable pacer or resuscitation pads over the left chest. Stable lung volumes. Mediastinal contours remain normal. Allowing for portable technique the lungs are clear. No pneumothorax or pleural effusion identified. Negative visible bowel gas. Stable visualized osseous structures. IMPRESSION: 1. Satisfactory ET tube position. Enteric tube courses to the abdomen, tip not included. 2. No acute cardiopulmonary abnormality. Electronically Signed   By: Genevie Ann M.D.   On: 01/04/2022 07:26   DG Chest Portable 1 View  Result Date: 01/03/2022 CLINICAL DATA:  Intubation, post CPR EXAM: PORTABLE CHEST 1 VIEW COMPARISON:  01/03/2022 FINDINGS: Endotracheal tube is in the right mainstem bronchus. Recommend retracting approximately 5 cm. NG tube enters the stomach. Prior CABG. Heart is normal size. Increasing airspace disease throughout the left lung which may reflect atelectasis due to right mainstem intubation. Similar right upper lobe opacity/atelectasis. No effusions. IMPRESSION: Right mainstem intubation. Recommend retracting endotracheal tube approximately 5 cm. Areas of airspace disease in the left lung and right upper lobe may be related to atelectasis from right mainstem intubation. These results were called by telephone at the time of interpretation on 01/03/2022 at 10:21 pm to provider [facility] , who verbally acknowledged these results. Electronically Signed   By: Rolm Baptise M.D.   On: 01/03/2022 22:24   CT Cervical Spine Wo Contrast  Result Date: 01/03/2022 CLINICAL DATA:  Neck trauma (Age >= 65y) EXAM: CT CERVICAL  SPINE WITHOUT CONTRAST TECHNIQUE: Multidetector CT imaging of the cervical spine was performed without intravenous contrast. Multiplanar CT image reconstructions were also generated. RADIATION DOSE REDUCTION: This exam was performed according to the departmental dose-optimization program which includes automated exposure control, adjustment of the mA and/or kV according to patient size and/or use of iterative reconstruction technique. COMPARISON:  None Available. FINDINGS: Alignment: Normal Skull base and vertebrae: No acute fracture. No primary bone lesion or focal pathologic process. Soft tissues and spinal canal: No prevertebral fluid or swelling. No visible canal hematoma. 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 Mild bilateral degenerative facet disease. Upper chest: No acute findings Other: None IMPRESSION: Degenerative disc and facet disease.  No acute bony abnormality. Electronically Signed   By: Rolm Baptise M.D.   On: 01/03/2022 20:10   CT HEAD WO CONTRAST (5MM)  Result Date:  01/03/2022 CLINICAL DATA:  Head trauma, minor (Age >= 65y) EXAM: CT HEAD WITHOUT CONTRAST TECHNIQUE: Contiguous axial images were obtained from the base of the skull through the vertex without intravenous contrast. RADIATION DOSE REDUCTION: This exam was performed according to the departmental dose-optimization program which includes automated exposure control, adjustment of the mA and/or kV according to patient size and/or use of iterative reconstruction technique. COMPARISON:  10/03/2021 FINDINGS: Brain: No acute intracranial abnormality. Specifically, no hemorrhage, hydrocephalus, mass lesion, acute infarction, or significant intracranial injury. Vascular: No hyperdense vessel or unexpected calcification. Skull: No acute calvarial abnormality. Sinuses/Orbits: No acute findings Other: None IMPRESSION: Normal study for patient's age. Electronically Signed   By: Charlett Nose M.D.   On: 01/03/2022 20:08   CT CHEST ABDOMEN PELVIS WO CONTRAST  Result Date: 01/03/2022 CLINICAL DATA:  Pneumonia, complication suspected, xray done. Shortness of breath. EXAM: CT CHEST, ABDOMEN AND PELVIS WITHOUT CONTRAST TECHNIQUE: Multidetector CT imaging of the chest, abdomen and pelvis was performed following the standard protocol without IV contrast. RADIATION DOSE REDUCTION: This exam was performed according to the departmental dose-optimization program which includes automated exposure control, adjustment of the mA and/or kV according to patient size and/or use of iterative reconstruction technique. COMPARISON:  None Available. FINDINGS: CT CHEST FINDINGS Cardiovascular: Heavily calcified aorta and coronary arteries. Prior CABG. Heart is normal size. Aorta is normal caliber. Mediastinum/Nodes: No mediastinal, hilar, or axillary adenopathy. Trachea and esophagus are unremarkable. Thyroid unremarkable. Lungs/Pleura: Lungs are clear. No focal airspace opacities or suspicious nodules. No effusions. Musculoskeletal: Chest wall soft tissues are unremarkable. No acute bony abnormality. CT ABDOMEN PELVIS FINDINGS Hepatobiliary: Small gallstone within the gallbladder. No focal hepatic abnormality. Pancreas: No focal abnormality or ductal dilatation. Spleen: No focal abnormality.  Normal size. Adrenals/Urinary Tract: Left kidney is atrophic. No renal or adrenal mass. No stones or hydronephrosis. Urinary bladder unremarkable. Stomach/Bowel: Left colonic diverticulosis. No active diverticulitis. Stomach and small bowel decompressed, unremarkable. Vascular/Lymphatic: Heavily calcified aorta. No evidence of aneurysm or adenopathy. Reproductive: Calcified fibroids in the uterus.  No adnexal masses. Other: No free fluid or free air. Musculoskeletal: No acute bony abnormality. Scoliosis and degenerative changes in the lumbar spine. IMPRESSION: No acute cardiopulmonary disease. Diffuse aortoiliac atherosclerosis.   No aneurysm. Cholelithiasis. Left colonic diverticulosis.  No active diverticulitis. No acute findings in the abdomen or pelvis. Electronically Signed   By: Charlett Nose M.D.   On: 01/03/2022 20:05   DG Chest 2 View  Result Date: 01/03/2022 CLINICAL DATA:  Shortness of breath EXAM: CHEST - 2 VIEW COMPARISON:  12/06/2021 FINDINGS: Prior CABG. Heart and mediastinal contours are within normal limits. No focal opacities or effusions. No acute bony abnormality. IMPRESSION: No active cardiopulmonary disease. Electronically Signed   By: Charlett Nose M.D.   On: 01/03/2022 19:25    Microbiology: Results for orders placed or performed during the hospital encounter of 01/11/22  Urine Culture     Status: Abnormal   Collection Time: 01/11/22  7:33 AM   Specimen: Urine, Random  Result Value Ref Range Status   Specimen Description   Final    URINE, RANDOM Performed at [facility], [address], [place], [place] [address]    Special Requests   Final    NONE Performed at [facility], [address] [place], [place] [address]    Culture >=100,000 COLONIES/mL ESCHERICHIA COLI (A)  Final   Report Status 01/13/2022 FINAL  Final   Organism ID, Bacteria ESCHERICHIA COLI (A)  Final  Susceptibility   Escherichia coli - MIC*    AMPICILLIN <=2 SENSITIVE Sensitive     CEFAZOLIN <=4 SENSITIVE Sensitive     CEFEPIME <=0.12 SENSITIVE Sensitive     CEFTRIAXONE <=0.25 SENSITIVE Sensitive     CIPROFLOXACIN <=0.25 SENSITIVE Sensitive     GENTAMICIN <=1 SENSITIVE Sensitive     IMIPENEM <=0.25 SENSITIVE Sensitive     NITROFURANTOIN <=16 SENSITIVE Sensitive     TRIMETH/SULFA <=20 SENSITIVE Sensitive     AMPICILLIN/SULBACTAM <=2 SENSITIVE Sensitive     PIP/TAZO <=4 SENSITIVE Sensitive     * >=100,000 COLONIES/mL ESCHERICHIA COLI  SARS Coronavirus 2 by RT PCR (hospital order, performed in [facility] hospital lab) *cepheid single result test* Nasal Mucosa     Status: None   Collection  Time: 01/11/22 11:00 AM   Specimen: Nasal Mucosa; Nasal Swab  Result Value Ref Range Status   SARS Coronavirus 2 by RT PCR NEGATIVE NEGATIVE Final    Comment: (NOTE) SARS-CoV-2 target nucleic acids are NOT DETECTED.  The SARS-CoV-2 RNA is generally detectable in upper and lower respiratory specimens during the acute phase of infection. The lowest concentration of SARS-CoV-2 viral copies this assay can detect is 250 copies / mL. A negative result does not preclude SARS-CoV-2 infection and should not be used as the sole basis for treatment or other patient management decisions.  A negative result may occur with improper specimen collection / handling, submission of specimen other than nasopharyngeal swab, presence of viral mutation(s) within the areas targeted by this assay, and inadequate number of viral copies (<250 copies / mL). A negative result must be combined with clinical observations, patient history, and epidemiological information.  Fact Sheet for Patients:   RoadLapTop.co.za  Fact Sheet for Healthcare Providers: http://kim-miller.com/  This test is not yet approved or  cleared by the [place] FDA and has been authorized for detection and/or diagnosis of SARS-CoV-2 by FDA under an Emergency Use Authorization (EUA).  This EUA will remain in effect (meaning this test can be used) for the duration of the COVID-19 declaration under Section 564(b)(1) of the Act, 21 U.S.C. section 360bbb-3(b)(1), unless the authorization is terminated or revoked sooner.  Performed at [facility], [address] [place], [place] [address]   MRSA Next Gen by PCR, Nasal     Status: None   Collection Time: 01/11/22 11:00 AM   Specimen: Nasal Mucosa; Nasal Swab  Result Value Ref Range Status   MRSA by PCR Next Gen NOT DETECTED NOT DETECTED Final    Comment: (NOTE) The GeneXpert MRSA Assay (FDA approved for NASAL specimens only), is  one component of a comprehensive MRSA colonization surveillance program. It is not intended to diagnose MRSA infection nor to guide or monitor treatment for MRSA infections. Test performance is not FDA approved in patients less than 32 years old. Performed at [facility], [address] [place], [place] [address]   Culture, Respiratory w Gram Stain     Status: None   Collection Time: 01/11/22  2:05 PM   Specimen: Tracheal Aspirate; Respiratory  Result Value Ref Range Status   Specimen Description   Final    TRACHEAL ASPIRATE Performed at [facility], [address], [place], [place] [address]    Special Requests   Final    NONE Performed at [facility], [address] [place], [place] [address]    Gram Stain   Final    FEW WBC PRESENT,BOTH PMN AND MONONUCLEAR FEW Romie Minus  POSITIVE COCCI IN PAIRS    Culture   Final    RARE Normal respiratory flora-no Staph aureus or Pseudomonas seen Performed at [place] [address] [place], [place] [address]    Report Status 01/14/2022 FINAL  Final  Culture, blood (Routine X 2) w Reflex to ID Panel     Status: None   Collection Time: 01/17/22  6:00 PM   Specimen: BLOOD  Result Value Ref Range Status   Specimen Description BLOOD RIGHT ANTECUBITAL  Final   Special Requests   Final    BOTTLES DRAWN AEROBIC AND ANAEROBIC Blood Culture adequate volume   Culture   Final    NO GROWTH 5 DAYS Performed at [facility], [address] [place], [place] [address]    Report Status 01/22/2022 FINAL  Final  Culture, blood (Routine X 2) w Reflex to ID Panel     Status: None   Collection Time: 01/17/22  6:00 PM   Specimen: BLOOD  Result Value Ref Range Status   Specimen Description BLOOD BLOOD LEFT WRIST  Final   Special Requests   Final    BOTTLES DRAWN AEROBIC AND ANAEROBIC Blood Culture adequate volume   Culture   Final    NO GROWTH 5 DAYS Performed at [facility], [address], [place], [place] [address]    Report Status 01/22/2022 FINAL  Final  Remove urinary catheter to obtain Clean Catch urine culture     Status: None   Collection Time: 01/17/22  6:32 PM   Specimen: Urine, Clean Catch  Result Value Ref Range Status   Specimen Description   Final    URINE, CLEAN CATCH Performed at [facility], [address], [place], [place] [address]    Special Requests   Final    NONE Performed at [facility], [address] [place], [place] [address]    Culture   Final    NO GROWTH Performed at [facility], [place] [address], [place], [place] [address]    Report Status 01/18/2022 FINAL  Final  Respiratory (~20 pathogens) panel by PCR     Status: None   Collection Time: 01/17/22  6:32 PM   Specimen: Nasopharyngeal Swab; Respiratory  Result Value Ref Range Status   Adenovirus NOT DETECTED NOT DETECTED Final   Coronavirus 229E NOT DETECTED NOT DETECTED Final    Comment: (NOTE) The Coronavirus on the Respiratory Panel, DOES NOT test for the novel  Coronavirus (2019 nCoV)    Coronavirus HKU1 NOT DETECTED NOT DETECTED Final   Coronavirus NL63 NOT DETECTED NOT DETECTED Final   Coronavirus OC43 NOT DETECTED NOT DETECTED Final   Metapneumovirus NOT DETECTED NOT DETECTED Final   Rhinovirus / Enterovirus NOT DETECTED NOT DETECTED Final   Influenza A NOT DETECTED NOT DETECTED Final   Influenza B NOT DETECTED NOT DETECTED Final   Parainfluenza Virus 1 NOT DETECTED NOT DETECTED Final   Parainfluenza Virus 2 NOT DETECTED NOT DETECTED Final   Parainfluenza Virus 3 NOT DETECTED NOT DETECTED Final   Parainfluenza Virus 4 NOT DETECTED NOT DETECTED Final   Respiratory Syncytial Virus NOT DETECTED NOT DETECTED Final   Bordetella pertussis NOT DETECTED NOT DETECTED Final   Bordetella Parapertussis NOT DETECTED NOT DETECTED Final   Chlamydophila pneumoniae NOT DETECTED NOT DETECTED Final   Mycoplasma pneumoniae NOT DETECTED NOT DETECTED Final     Comment: Performed at [facility], [place] [address] [place], [place] [address]  Labs: CBC: Recent Labs  Lab 01/29/22 1003 01/29/22 1133 01/30/22 0903 01/31/22 0345  WBC 13.6*  --  15.1* 10.4  NEUTROABS 10.0*  --   --   --   HGB 9.1* 8.5* 9.7* 8.3*  HCT 29.2* 25.0* 29.6* 25.6*  MCV 94.5  --  91.6 92.1  PLT 365  --  292 0000000   Basic Metabolic Panel: Recent Labs  Lab 01/29/22 1003 01/29/22 1133 01/30/22 0747 01/30/22 1123 01/31/22 0345 02/01/22 0438 02/02/22 0411  NA 132*   < > 132* 132* 133* 131* 133*  K 4.5   < > 7.0* 5.6* 4.8 4.6 3.9  CL 94*  --  95* 93* 91* 93* 94*  CO2 27  --  23 28 30  32 31  GLUCOSE 217*  --  135* 126* 98 90 100*  BUN 55*  --  68* 62* 63* 60* 53*  CREATININE 1.50*  --  1.53* 1.48* 1.35* 1.19* 1.04*  CALCIUM 8.8*  --  8.6* 8.6* 8.4* 8.3* 8.0*  MG 2.7*  --   --   --   --   --   --    < > = values in this interval not displayed.   Liver Function Tests: Recent Labs  Lab 01/29/22 1003 01/31/22 0345  AST 33 22  ALT 28 22  ALKPHOS 103 73  BILITOT 1.0 0.6  PROT 6.1* 5.1*  ALBUMIN 3.3* 2.9*   CBG: Recent Labs  Lab 01/29/22 1829 01/30/22 1039  GLUCAP 251* 126*    Discharge time spent: greater than 30 minutes.  Signed: Tawni Millers, MD [facility] 02/02/2022

## 2022-02-03 NOTE — TOC Progression Note (Signed)
Transition of Care [facility]) - Progression Note    Patient Details  Name: Lindsey Pollard MRN: [id number] Date of Birth: [birth year]  Transition of Care [facility]) CM/SW Contact  Patrice Paradise, LCSW Phone Number: 02/03/2022, 10:25 AM  Clinical Narrative:     Pt's authorization approved:  [employer] #[phone] Health Plan #- [id number] Authorization dates: 8/12-8/15 Fax Number: [phone]    TOC team will continue to assist with discharge planning needs.   Expected Discharge Plan: Skilled Nursing Facility Barriers to Discharge: Continued Medical Work up  Expected Discharge Plan and Services Expected Discharge Plan: Skilled Nursing Facility         Expected Discharge Date: 02/03/22                                     Social Determinants of Health (SDOH) Interventions    Readmission Risk Interventions    01/18/2022    9:04 PM 01/05/2022    5:09 PM  Readmission Risk Prevention Plan  Transportation Screening Complete Complete  PCP or Specialist Appt within 3-5 Days  Complete  Social Work Consult for Recovery Care Planning/Counseling  Not Complete  SW consult not completed comments  NA  Palliative Care Screening  Not Applicable  Medication Review Oceanographer) Referral to Pharmacy Referral to Pharmacy  PCP or Specialist appointment within 3-5 days of discharge Complete   HRI or Home Care Consult Complete   SW Recovery Care/Counseling Consult Not Complete   SW Consult Not Complete Comments NA   Palliative Care Screening Not Applicable   Skilled Nursing Facility Complete

## 2022-02-03 NOTE — Progress Notes (Signed)
Patient has remained stable overnight with no respiratory distress or dyspnea, no chest pain, this am she is feeling better, and comfortable in being transfer to SNF  BP (!) 130/39 (BP Location: Right Arm)   Pulse 69   Temp 98.6 F (37 C) (Oral)   Resp 13   Ht 4\' 11"  (1.499 m)   Wt 56.5 kg   SpO2 100%   BMI 25.16 kg/m   Neurology awake and alert ENT with mild pallor Cardiovascular with S1 and S2 present and rhythmic with no gallops, positive systolic murmur at the apex Respiratory with no rales or wheezing, no rhonchi Abdomen not distended No lower extremity edema  Acute on chronic heart failure exacerbation. Plan to transfer to SNF, continue diuresis and blood pressure control.   I spoke over the phone with the patient's nephew about patient's  condition, plan of care, prognosis and all questions were addressed.

## 2022-02-03 NOTE — TOC Transition Note (Signed)
Transition of Care [facility]) - CM/SW Discharge Note   Patient Details  Name: Lindsey Pollard MRN: [id number] Date of Birth: [birth year]  Transition of Care [facility]) CM/SW Contact:  Patrice Paradise, LCSW Phone Number:[phone] 02/03/2022, 10:33 AM   Clinical Narrative:    Patient will DC to:?[facility] Anticipated DC date:?02/03/2022 Family notified:?Daughter left VM Transport by: Sharin Mons   Per MD patient ready for DC to [facility]. RN, patient, patient's family, and facility notified of DC. Discharge Summary sent to facility. RN given number for report  [phone] [id number] room 305b. DC packet on chart. Ambulance transport requested for patient.   CSW signing off.   Judd Lien, [place] [phone]    Final next level of care: Skilled Nursing Facility Barriers to Discharge: Barriers Resolved   Patient Goals and CMS Choice        Discharge Placement              Patient chooses bed at: [facility] Patient to be transferred to facility by: [facility] Name of family member notified: Daughter Patient and family notified of of transfer: 02/03/22  Discharge Plan and Services                                     Social Determinants of Health (SDOH) Interventions     Readmission Risk Interventions    01/18/2022    9:04 PM 01/05/2022    5:09 PM  Readmission Risk Prevention Plan  Transportation Screening Complete Complete  PCP or Specialist Appt within 3-5 Days  Complete  Social Work Consult for Recovery Care Planning/Counseling  Not Complete  SW consult not completed comments  NA  Palliative Care Screening  Not Applicable  Medication Review Oceanographer) Referral to Pharmacy Referral to Pharmacy  PCP or Specialist appointment within 3-5 days of discharge Complete   HRI or Home Care Consult Complete   SW Recovery Care/Counseling Consult Not Complete   SW Consult Not Complete Comments NA   Palliative Care Screening Not Applicable   Skilled Nursing Facility Complete

## 2022-02-06 ENCOUNTER — Inpatient Hospital Stay (HOSPITAL_COMMUNITY)
Admission: EM | Admit: 2022-02-06 | Discharge: 2022-02-16 | DRG: 250 | Disposition: A | Discharge status: Hospice | Payer: Medicare Other | Attending: Internal Medicine | Admitting: Internal Medicine

## 2022-02-06 ENCOUNTER — Encounter (HOSPITAL_COMMUNITY): Payer: Self-pay | Admitting: Internal Medicine

## 2022-02-06 ENCOUNTER — Emergency Department (HOSPITAL_COMMUNITY): Payer: Medicare Other

## 2022-02-06 ENCOUNTER — Other Ambulatory Visit (HOSPITAL_COMMUNITY): Payer: Medicare Other

## 2022-02-06 DIAGNOSIS — T380X5A Adverse effect of glucocorticoids and synthetic analogues, initial encounter: Secondary | ICD-10-CM | POA: Diagnosis not present

## 2022-02-06 DIAGNOSIS — E1122 Type 2 diabetes mellitus with diabetic chronic kidney disease: Secondary | ICD-10-CM | POA: Diagnosis present

## 2022-02-06 DIAGNOSIS — Z7189 Other specified counseling: Secondary | ICD-10-CM | POA: Diagnosis not present

## 2022-02-06 DIAGNOSIS — Z515 Encounter for palliative care: Secondary | ICD-10-CM | POA: Diagnosis not present

## 2022-02-06 DIAGNOSIS — L89301 Pressure ulcer of unspecified buttock, stage 1: Secondary | ICD-10-CM | POA: Diagnosis present

## 2022-02-06 DIAGNOSIS — Z7984 Long term (current) use of oral hypoglycemic drugs: Secondary | ICD-10-CM

## 2022-02-06 DIAGNOSIS — I083 Combined rheumatic disorders of mitral, aortic and tricuspid valves: Secondary | ICD-10-CM | POA: Diagnosis present

## 2022-02-06 DIAGNOSIS — I25110 Atherosclerotic heart disease of native coronary artery with unstable angina pectoris: Secondary | ICD-10-CM | POA: Diagnosis not present

## 2022-02-06 DIAGNOSIS — Z888 Allergy status to other drugs, medicaments and biological substances status: Secondary | ICD-10-CM

## 2022-02-06 DIAGNOSIS — I5043 Acute on chronic combined systolic (congestive) and diastolic (congestive) heart failure: Secondary | ICD-10-CM | POA: Diagnosis present

## 2022-02-06 DIAGNOSIS — J9621 Acute and chronic respiratory failure with hypoxia: Secondary | ICD-10-CM | POA: Diagnosis present

## 2022-02-06 DIAGNOSIS — I130 Hypertensive heart and chronic kidney disease with heart failure and stage 1 through stage 4 chronic kidney disease, or unspecified chronic kidney disease: Secondary | ICD-10-CM | POA: Diagnosis present

## 2022-02-06 DIAGNOSIS — Z9981 Dependence on supplemental oxygen: Secondary | ICD-10-CM

## 2022-02-06 DIAGNOSIS — K219 Gastro-esophageal reflux disease without esophagitis: Secondary | ICD-10-CM | POA: Diagnosis present

## 2022-02-06 DIAGNOSIS — N1831 Chronic kidney disease, stage 3a: Secondary | ICD-10-CM | POA: Diagnosis present

## 2022-02-06 DIAGNOSIS — R6510 Systemic inflammatory response syndrome (SIRS) of non-infectious origin without acute organ dysfunction: Secondary | ICD-10-CM | POA: Diagnosis present

## 2022-02-06 DIAGNOSIS — J9622 Acute and chronic respiratory failure with hypercapnia: Secondary | ICD-10-CM | POA: Diagnosis present

## 2022-02-06 DIAGNOSIS — I5023 Acute on chronic systolic (congestive) heart failure: Secondary | ICD-10-CM

## 2022-02-06 DIAGNOSIS — I252 Old myocardial infarction: Secondary | ICD-10-CM | POA: Diagnosis not present

## 2022-02-06 DIAGNOSIS — Z7982 Long term (current) use of aspirin: Secondary | ICD-10-CM

## 2022-02-06 DIAGNOSIS — I2510 Atherosclerotic heart disease of native coronary artery without angina pectoris: Secondary | ICD-10-CM | POA: Diagnosis present

## 2022-02-06 DIAGNOSIS — E871 Hypo-osmolality and hyponatremia: Secondary | ICD-10-CM | POA: Diagnosis present

## 2022-02-06 DIAGNOSIS — Z20822 Contact with and (suspected) exposure to covid-19: Secondary | ICD-10-CM | POA: Diagnosis present

## 2022-02-06 DIAGNOSIS — E1165 Type 2 diabetes mellitus with hyperglycemia: Secondary | ICD-10-CM | POA: Diagnosis present

## 2022-02-06 DIAGNOSIS — I25118 Atherosclerotic heart disease of native coronary artery with other forms of angina pectoris: Secondary | ICD-10-CM

## 2022-02-06 DIAGNOSIS — R778 Other specified abnormalities of plasma proteins: Secondary | ICD-10-CM | POA: Diagnosis not present

## 2022-02-06 DIAGNOSIS — I214 Non-ST elevation (NSTEMI) myocardial infarction: Principal | ICD-10-CM | POA: Diagnosis present

## 2022-02-06 DIAGNOSIS — L89152 Pressure ulcer of sacral region, stage 2: Secondary | ICD-10-CM | POA: Diagnosis present

## 2022-02-06 DIAGNOSIS — Z7902 Long term (current) use of antithrombotics/antiplatelets: Secondary | ICD-10-CM

## 2022-02-06 DIAGNOSIS — Z880 Allergy status to penicillin: Secondary | ICD-10-CM

## 2022-02-06 DIAGNOSIS — Z79899 Other long term (current) drug therapy: Secondary | ICD-10-CM

## 2022-02-06 DIAGNOSIS — Z8249 Family history of ischemic heart disease and other diseases of the circulatory system: Secondary | ICD-10-CM

## 2022-02-06 DIAGNOSIS — Z955 Presence of coronary angioplasty implant and graft: Secondary | ICD-10-CM

## 2022-02-06 DIAGNOSIS — I4892 Unspecified atrial flutter: Secondary | ICD-10-CM | POA: Diagnosis not present

## 2022-02-06 DIAGNOSIS — I25810 Atherosclerosis of coronary artery bypass graft(s) without angina pectoris: Secondary | ICD-10-CM | POA: Diagnosis not present

## 2022-02-06 DIAGNOSIS — E785 Hyperlipidemia, unspecified: Secondary | ICD-10-CM | POA: Diagnosis present

## 2022-02-06 DIAGNOSIS — I480 Paroxysmal atrial fibrillation: Secondary | ICD-10-CM | POA: Diagnosis not present

## 2022-02-06 DIAGNOSIS — E119 Type 2 diabetes mellitus without complications: Secondary | ICD-10-CM

## 2022-02-06 DIAGNOSIS — Z833 Family history of diabetes mellitus: Secondary | ICD-10-CM

## 2022-02-06 DIAGNOSIS — R54 Age-related physical debility: Secondary | ICD-10-CM | POA: Diagnosis present

## 2022-02-06 DIAGNOSIS — Z951 Presence of aortocoronary bypass graft: Secondary | ICD-10-CM

## 2022-02-06 DIAGNOSIS — Z87891 Personal history of nicotine dependence: Secondary | ICD-10-CM

## 2022-02-06 DIAGNOSIS — N189 Chronic kidney disease, unspecified: Secondary | ICD-10-CM

## 2022-02-06 DIAGNOSIS — D631 Anemia in chronic kidney disease: Secondary | ICD-10-CM | POA: Diagnosis present

## 2022-02-06 DIAGNOSIS — I255 Ischemic cardiomyopathy: Secondary | ICD-10-CM | POA: Diagnosis present

## 2022-02-06 DIAGNOSIS — Z8674 Personal history of sudden cardiac arrest: Secondary | ICD-10-CM

## 2022-02-06 LAB — URINALYSIS, ROUTINE W REFLEX MICROSCOPIC
Bilirubin Urine: NEGATIVE
Glucose, UA: >=500 mg/dL — AB
Hgb urine dipstick: NEGATIVE
Ketones, ur: NEGATIVE mg/dL
Nitrite: NEGATIVE
Protein, ur: NEGATIVE mg/dL
Specific Gravity, Urine: 1.009 (ref 1.005–1.030)
pH: 5 (ref 5.0–8.0)

## 2022-02-06 LAB — CBC WITH DIFFERENTIAL/PLATELET
Abs Immature Granulocytes: 0.09 10*3/uL — ABNORMAL HIGH (ref 0.00–0.07)
Basophils Absolute: 0.1 10*3/uL (ref 0.0–0.1)
Basophils Relative: 0 %
Eosinophils Absolute: 0.2 10*3/uL (ref 0.0–0.5)
Eosinophils Relative: 1 %
HCT: 29.9 % — ABNORMAL LOW (ref 36.0–46.0)
Hemoglobin: 9.6 g/dL — ABNORMAL LOW (ref 12.0–15.0)
Immature Granulocytes: 1 %
Lymphocytes Relative: 7 %
Lymphs Abs: 1.2 10*3/uL (ref 0.7–4.0)
MCH: 29.8 pg (ref 26.0–34.0)
MCHC: 32.1 g/dL (ref 30.0–36.0)
MCV: 92.9 fL (ref 80.0–100.0)
Monocytes Absolute: 1.1 10*3/uL — ABNORMAL HIGH (ref 0.1–1.0)
Monocytes Relative: 7 %
Neutro Abs: 13.7 10*3/uL — ABNORMAL HIGH (ref 1.7–7.7)
Neutrophils Relative %: 84 %
Platelets: 336 10*3/uL (ref 150–400)
RBC: 3.22 MIL/uL — ABNORMAL LOW (ref 3.87–5.11)
RDW: 14.6 % (ref 11.5–15.5)
WBC: 16.3 10*3/uL — ABNORMAL HIGH (ref 4.0–10.5)
nRBC: 0 % (ref 0.0–0.2)

## 2022-02-06 LAB — COMPREHENSIVE METABOLIC PANEL
ALT: 23 U/L (ref 0–44)
AST: 35 U/L (ref 15–41)
Albumin: 3.5 g/dL (ref 3.5–5.0)
Alkaline Phosphatase: 136 U/L — ABNORMAL HIGH (ref 38–126)
Anion gap: 13 (ref 5–15)
BUN: 26 mg/dL — ABNORMAL HIGH (ref 8–23)
CO2: 32 mmol/L (ref 22–32)
Calcium: 9.2 mg/dL (ref 8.9–10.3)
Chloride: 89 mmol/L — ABNORMAL LOW (ref 98–111)
Creatinine, Ser: 1.09 mg/dL — ABNORMAL HIGH (ref 0.44–1.00)
GFR, Estimated: 51 mL/min — ABNORMAL LOW (ref >60)
Glucose, Bld: 272 mg/dL — ABNORMAL HIGH (ref 70–99)
Potassium: 3.7 mmol/L (ref 3.5–5.1)
Sodium: 134 mmol/L — ABNORMAL LOW (ref 135–145)
Total Bilirubin: 1.1 mg/dL (ref 0.3–1.2)
Total Protein: 6.1 g/dL — ABNORMAL LOW (ref 6.5–8.1)

## 2022-02-06 LAB — TROPONIN I (HIGH SENSITIVITY)
Troponin I (High Sensitivity): 46 ng/L — ABNORMAL HIGH (ref <18)
Troponin I (High Sensitivity): 145 ng/L (ref <18)
Troponin I (High Sensitivity): 621 ng/L (ref <18)
Troponin I (High Sensitivity): 833 ng/L (ref <18)

## 2022-02-06 LAB — I-STAT VENOUS BLOOD GAS, ED
Acid-Base Excess: 10 mmol/L — ABNORMAL HIGH (ref 0.0–2.0)
Bicarbonate: 38.4 mmol/L — ABNORMAL HIGH (ref 20.0–28.0)
Calcium, Ion: 1.11 mmol/L — ABNORMAL LOW (ref 1.15–1.40)
HCT: 33 % — ABNORMAL LOW (ref 36.0–46.0)
Hemoglobin: 11.2 g/dL — ABNORMAL LOW (ref 12.0–15.0)
O2 Saturation: 61 %
Potassium: 4.1 mmol/L (ref 3.5–5.1)
Sodium: 131 mmol/L — ABNORMAL LOW (ref 135–145)
TCO2: 41 mmol/L — ABNORMAL HIGH (ref 22–32)
pCO2, Ven: 71.6 mmHg (ref 44–60)
pH, Ven: 7.337 (ref 7.25–7.43)
pO2, Ven: 36 mmHg (ref 32–45)

## 2022-02-06 LAB — I-STAT ARTERIAL BLOOD GAS, ED
Acid-Base Excess: 12 mmol/L — ABNORMAL HIGH (ref 0.0–2.0)
Bicarbonate: 37.3 mmol/L — ABNORMAL HIGH (ref 20.0–28.0)
Calcium, Ion: 1.11 mmol/L — ABNORMAL LOW (ref 1.15–1.40)
HCT: 28 % — ABNORMAL LOW (ref 36.0–46.0)
Hemoglobin: 9.5 g/dL — ABNORMAL LOW (ref 12.0–15.0)
O2 Saturation: 100 %
Patient temperature: 96.9
Potassium: 3.4 mmol/L — ABNORMAL LOW (ref 3.5–5.1)
Sodium: 129 mmol/L — ABNORMAL LOW (ref 135–145)
TCO2: 39 mmol/L — ABNORMAL HIGH (ref 22–32)
pCO2 arterial: 47 mmHg (ref 32–48)
pH, Arterial: 7.504 — ABNORMAL HIGH (ref 7.35–7.45)
pO2, Arterial: 229 mmHg — ABNORMAL HIGH (ref 83–108)

## 2022-02-06 LAB — SARS CORONAVIRUS 2 BY RT PCR: SARS Coronavirus 2 by RT PCR: NEGATIVE

## 2022-02-06 LAB — CBG MONITORING, ED
Glucose-Capillary: 264 mg/dL — ABNORMAL HIGH (ref 70–99)
Glucose-Capillary: 227 mg/dL — ABNORMAL HIGH (ref 70–99)
Glucose-Capillary: 172 mg/dL — ABNORMAL HIGH (ref 70–99)

## 2022-02-06 LAB — I-STAT CHEM 8, ED
BUN: 29 mg/dL — ABNORMAL HIGH (ref 8–23)
Calcium, Ion: 1.11 mmol/L — ABNORMAL LOW (ref 1.15–1.40)
Chloride: 87 mmol/L — ABNORMAL LOW (ref 98–111)
Creatinine, Ser: 1.1 mg/dL — ABNORMAL HIGH (ref 0.44–1.00)
Glucose, Bld: 276 mg/dL — ABNORMAL HIGH (ref 70–99)
HCT: 32 % — ABNORMAL LOW (ref 36.0–46.0)
Hemoglobin: 10.9 g/dL — ABNORMAL LOW (ref 12.0–15.0)
Potassium: 3.7 mmol/L (ref 3.5–5.1)
Sodium: 131 mmol/L — ABNORMAL LOW (ref 135–145)
TCO2: 35 mmol/L — ABNORMAL HIGH (ref 22–32)

## 2022-02-06 LAB — GLUCOSE, CAPILLARY: Glucose-Capillary: 159 mg/dL — ABNORMAL HIGH (ref 70–99)

## 2022-02-06 LAB — BRAIN NATRIURETIC PEPTIDE: B Natriuretic Peptide: 2726.8 pg/mL — ABNORMAL HIGH (ref 0.0–100.0)

## 2022-02-06 LAB — LACTIC ACID, PLASMA: Lactic Acid, Venous: 1.8 mmol/L (ref 0.5–1.9)

## 2022-02-06 LAB — PROCALCITONIN: Procalcitonin: 0.15 ng/mL

## 2022-02-06 MED ORDER — NITROGLYCERIN IN D5W 200-5 MCG/ML-% IV SOLN: 0.0000 ug/min | INTRAVENOUS | Status: DC

## 2022-02-06 MED ORDER — MAGNESIUM SULFATE 2 GM/50ML IV SOLN
2.0000 g | Freq: Once | INTRAVENOUS | Status: AC
  Administered 2022-02-06: 2 g via INTRAVENOUS
  Filled 2022-02-06: qty 50

## 2022-02-06 MED ORDER — CLOPIDOGREL BISULFATE 75 MG PO TABS
75.0000 mg | ORAL_TABLET | Freq: Every day | ORAL | Status: DC
  Administered 2022-02-06: 75 mg via ORAL
  Filled 2022-02-06: qty 1

## 2022-02-06 MED ORDER — ACETAMINOPHEN 325 MG PO TABS
650.0000 mg | ORAL_TABLET | Freq: Four times a day (QID) | ORAL | Status: DC | PRN
  Administered 2022-02-09: 650 mg via ORAL
  Filled 2022-02-06: qty 2

## 2022-02-06 MED ORDER — CEFEPIME HCL 2 G IV SOLR
2.0000 g | Freq: Once | INTRAVENOUS | Status: AC
  Administered 2022-02-06: 2 g via INTRAVENOUS
  Filled 2022-02-06: qty 12.5

## 2022-02-06 MED ORDER — ALBUTEROL SULFATE (2.5 MG/3ML) 0.083% IN NEBU
2.5000 mg | INHALATION_SOLUTION | RESPIRATORY_TRACT | Status: DC | PRN
  Administered 2022-02-09: 2.5 mg via RESPIRATORY_TRACT
  Filled 2022-02-06: qty 3

## 2022-02-06 MED ORDER — VANCOMYCIN HCL 500 MG/100ML IV SOLN
500.0000 mg | INTRAVENOUS | Status: DC
  Administered 2022-02-07: 500 mg via INTRAVENOUS
  Filled 2022-02-06: qty 100

## 2022-02-06 MED ORDER — SODIUM CHLORIDE 0.9% FLUSH: 3.0000 mL | INTRAVENOUS | Status: DC | PRN

## 2022-02-06 MED ORDER — SODIUM CHLORIDE 0.9% FLUSH
3.0000 mL | Freq: Two times a day (BID) | INTRAVENOUS | Status: DC
  Administered 2022-02-06 – 2022-02-15 (×7): 3 mL via INTRAVENOUS

## 2022-02-06 MED ORDER — EMPAGLIFLOZIN 10 MG PO TABS
10.0000 mg | ORAL_TABLET | Freq: Every day | ORAL | Status: DC
  Administered 2022-02-06 – 2022-02-16 (×11): 10 mg via ORAL
  Filled 2022-02-06 (×11): qty 1

## 2022-02-06 MED ORDER — SODIUM CHLORIDE 0.9 % IV SOLN: INTRAVENOUS | Status: DC

## 2022-02-06 MED ORDER — ATORVASTATIN CALCIUM 80 MG PO TABS
80.0000 mg | ORAL_TABLET | Freq: Every evening | ORAL | Status: DC
  Administered 2022-02-06 – 2022-02-15 (×9): 80 mg via ORAL
  Filled 2022-02-06 (×9): qty 1

## 2022-02-06 MED ORDER — ASPIRIN 81 MG PO CHEW
81.0000 mg | CHEWABLE_TABLET | Freq: Every day | ORAL | Status: DC
  Administered 2022-02-06: 81 mg via ORAL
  Filled 2022-02-06: qty 1

## 2022-02-06 MED ORDER — ALPRAZOLAM 0.25 MG PO TABS
0.1250 mg | ORAL_TABLET | Freq: Three times a day (TID) | ORAL | Status: DC | PRN
  Administered 2022-02-06 – 2022-02-15 (×11): 0.125 mg via ORAL
  Filled 2022-02-06 (×11): qty 1

## 2022-02-06 MED ORDER — FUROSEMIDE 10 MG/ML IJ SOLN
40.0000 mg | Freq: Two times a day (BID) | INTRAMUSCULAR | Status: DC
  Administered 2022-02-06 – 2022-02-08 (×4): 40 mg via INTRAVENOUS
  Filled 2022-02-06 (×4): qty 4

## 2022-02-06 MED ORDER — METOPROLOL SUCCINATE ER 25 MG PO TB24
25.0000 mg | ORAL_TABLET | Freq: Every day | ORAL | Status: DC
  Administered 2022-02-06 – 2022-02-16 (×11): 25 mg via ORAL
  Filled 2022-02-06 (×11): qty 1

## 2022-02-06 MED ORDER — METHYLPREDNISOLONE SODIUM SUCC 125 MG IJ SOLR
125.0000 mg | Freq: Once | INTRAMUSCULAR | Status: AC
Start: 2022-02-06 — End: 2022-02-06
  Administered 2022-02-06: 125 mg via INTRAVENOUS
  Filled 2022-02-06: qty 2

## 2022-02-06 MED ORDER — NITROGLYCERIN IN D5W 200-5 MCG/ML-% IV SOLN
10.0000 ug/min | INTRAVENOUS | Status: DC
  Administered 2022-02-06: 10 ug/min via INTRAVENOUS
  Filled 2022-02-06: qty 250

## 2022-02-06 MED ORDER — VANCOMYCIN HCL IN DEXTROSE 1-5 GM/200ML-% IV SOLN
1000.0000 mg | Freq: Once | INTRAVENOUS | Status: AC
  Administered 2022-02-06: 1000 mg via INTRAVENOUS
  Filled 2022-02-06: qty 200

## 2022-02-06 MED ORDER — SODIUM CHLORIDE 0.9 % IV SOLN: 250.0000 mL | INTRAVENOUS | Status: DC | PRN

## 2022-02-06 MED ORDER — SODIUM CHLORIDE 0.9% FLUSH
3.0000 mL | Freq: Two times a day (BID) | INTRAVENOUS | Status: DC
  Administered 2022-02-07 – 2022-02-16 (×8): 3 mL via INTRAVENOUS

## 2022-02-06 MED ORDER — ACETAMINOPHEN 650 MG RE SUPP: 650.0000 mg | Freq: Four times a day (QID) | RECTAL | Status: DC | PRN

## 2022-02-06 MED ORDER — INSULIN ASPART 100 UNIT/ML IJ SOLN
0.0000 [IU] | Freq: Four times a day (QID) | INTRAMUSCULAR | Status: DC
  Administered 2022-02-06: 2 [IU] via SUBCUTANEOUS
  Administered 2022-02-06: 5 [IU] via SUBCUTANEOUS

## 2022-02-06 MED ORDER — FUROSEMIDE 10 MG/ML IJ SOLN
40.0000 mg | Freq: Once | INTRAMUSCULAR | Status: AC
  Administered 2022-02-06: 40 mg via INTRAVENOUS
  Filled 2022-02-06: qty 4

## 2022-02-06 MED ORDER — ENOXAPARIN SODIUM 40 MG/0.4ML IJ SOSY
40.0000 mg | PREFILLED_SYRINGE | INTRAMUSCULAR | Status: DC
  Administered 2022-02-06: 40 mg via SUBCUTANEOUS
  Filled 2022-02-06: qty 0.4

## 2022-02-06 MED ORDER — SODIUM CHLORIDE 0.9 % IV SOLN
2.0000 g | Freq: Two times a day (BID) | INTRAVENOUS | Status: DC
  Administered 2022-02-06 – 2022-02-07 (×2): 2 g via INTRAVENOUS
  Filled 2022-02-06 (×2): qty 12.5

## 2022-02-06 MED ORDER — HEPARIN (PORCINE) 25000 UT/250ML-% IV SOLN
700.0000 [IU]/h | INTRAVENOUS | Status: DC
  Administered 2022-02-06: 700 [IU]/h via INTRAVENOUS
  Filled 2022-02-06: qty 250

## 2022-02-06 MED ORDER — LORAZEPAM 2 MG/ML IJ SOLN
0.1250 mg | Freq: Three times a day (TID) | INTRAMUSCULAR | Status: DC | PRN
Start: 2022-02-06 — End: 2022-02-06

## 2022-02-06 MED ORDER — ASPIRIN 81 MG PO CHEW
81.0000 mg | CHEWABLE_TABLET | ORAL | Status: AC
  Administered 2022-02-07: 81 mg via ORAL
  Filled 2022-02-06: qty 1

## 2022-02-06 MED ORDER — HEPARIN BOLUS VIA INFUSION
3400.0000 [IU] | Freq: Once | INTRAVENOUS | Status: AC
  Administered 2022-02-06: 3400 [IU] via INTRAVENOUS
  Filled 2022-02-06: qty 3400

## 2022-02-06 MED ORDER — LORAZEPAM 2 MG/ML IJ SOLN
0.5000 mg | Freq: Once | INTRAMUSCULAR | Status: AC
  Administered 2022-02-06: 0.5 mg via INTRAVENOUS
  Filled 2022-02-06: qty 1

## 2022-02-06 NOTE — Progress Notes (Signed)
Patient placed back on bipap per MD.  Patient tolerating well.  Will continue to monitor.

## 2022-02-06 NOTE — ED Triage Notes (Signed)
Pt arrived via [facility] from [facility]. Per EMS pt started feeling shoB this morning. Recent D/C from hospital. Pt hot to touch. Chronic 2L. Hx of arrest and CHF.   86% 4L [place] --> CPAP 93%. 167/72, 105 HR   5mg  alb. 0.5 Atrovent. 2 amio gtt (total of 300mg ). 1 nitro.

## 2022-02-06 NOTE — H&P (Addendum)
History and Physical    Patient: Lindsey Pollard ENI:[id number] DOB: [birth year] DOA: 02/06/2022 DOS: the patient was seen and examined on 02/06/2022 PCP: Lauro Regulus, MD  Patient coming from: [facility] via EMS  Chief Complaint:  Chief Complaint  Patient presents with   Respiratory Distress   HPI: Lindsey Pollard is a 81 y.o. female with medical history significant of hypertension, hyperlipidemia, heart failure reduced EF, hx of ventricular fibrillation cardiac arrest, CAD s/p CABG, chronic respiratory failure on 2 L, CKD stage III, and DM type II presented with complaints of slowly worsening shortness of breath over the last 3 days.  History is limited from the patient as she is on BiPAP.  Patient had just recently been hospitalized from 8/7-8/12 due to acute on chronic respiratory failure with hypoxia secondary to acute on chronic systolic heart failure.  Patient was temporarily on BiPAP and improved with diuresis.  She reports that she still felt weak when she was discharged to rehab facility.  Over the last couple days she is been feeling poorly.  Denies having any fever, chills, chest pain, cough, nausea, vomiting, abdominal pain, or leg swelling.  She was unable to rest at night due to orthopnea.  Patient reported that even trying to move her legs made her feel winded.  In route with EMS patient was noted to be warm to the touch with O2 saturations as low as 86% on 4 L of nasal cannula oxygen.  Patient was placed on CPAP with improvement to 93%.  Patient has been given DuoNeb breathing treatment, two amiodarone drips totaling 300 mg, and 1 sublingual nitroglycerin tablet.  In the emergency department patient was noted to be afebrile with respirations 12-33, blood pressures maintained, and O2 saturations currently maintained on BiPAP.  Labs significant for WBC 16.3, hemoglobin 9.6->10.9, sodium 134, BUN 26 creatinine 1.09, glucose 272,  BNP 2726.8, and high-sensitivity  troponin 46->145.  Chest x-ray noted continued moderate to large bilateral pleural effusions with bibasilar collapse or consolidation and pulmonary vascular congestion with probable interstitial edema.  COVID-19 screening was pending.  Blood cultures were obtained.  Patient had been given 125 mg of Solu-Medrol IV, vancomycin, cefepime, magnesium sulfate 2 g, and Lasix 40 mg IV.  Review of Systems: As mentioned in the history of present illness. All other systems reviewed and are negative. Past Medical History:  Diagnosis Date   Chest pain 12/06/2021   Diabetes mellitus without complication (HCC)    Hyperlipidemia    Hypertension    Myocardial infarction [facility])    Past Surgical History:  Procedure Laterality Date   CARDIAC SURGERY     bypass   CORONARY/GRAFT ACUTE MI REVASCULARIZATION N/A 06/01/2021   Procedure: Coronary/Graft Acute MI Revascularization;  Surgeon: Alwyn Pea, MD;  Location: [facility] INVASIVE CV LAB;  Service: Cardiovascular;  Laterality: N/A;   EYE SURGERY     cataract extraction   LEFT HEART CATH AND CORONARY ANGIOGRAPHY N/A 06/01/2021   Procedure: LEFT HEART CATH AND CORONARY ANGIOGRAPHY;  Surgeon: Alwyn Pea, MD;  Location: [facility] INVASIVE CV LAB;  Service: Cardiovascular;  Laterality: N/A;   Social History:  reports that she has quit smoking. Her smoking use included cigarettes. She has never used smokeless tobacco. She reports that she does not currently use alcohol. She reports that she does not currently use drugs.  Allergies  Allergen Reactions   Tetanus Toxoids Swelling    Tetanus and diphtheria toxids   Penicillin G Rash  Family History  Problem Relation Age of Onset   Congestive Heart Failure Mother    Diabetes Father    Breast cancer Neg Hx     Prior to Admission medications   Medication Sig Start Date End Date Taking? Authorizing Provider  acetaminophen (TYLENOL) 325 MG tablet Take 2 tablets (650 mg total) by mouth every 4 (four) hours as  needed for mild pain (temp > 101.5). 01/23/22  Yes Sunnie NielsenAlexander, Natalie, DO  albuterol (VENTOLIN HFA) 108 (90 Base) MCG/ACT inhaler Inhale 1-2 puffs into the lungs every 4 (four) hours as needed for shortness of breath. 08/09/21  Yes [provider]  ALPRAZolam (XANAX) 0.25 MG tablet Take 0.5 tablets (0.125 mg total) by mouth 3 (three) times daily as needed for anxiety. 02/02/22  Yes Arrien, York RamMauricio Daniel, MD  aspirin 81 MG EC tablet Take 81 mg by mouth every evening.   Yes [provider]  atorvastatin (LIPITOR) 80 MG tablet Take 80 mg by mouth every evening. 07/06/20  Yes [provider]  Cholecalciferol 25 MCG (1000 UT) capsule Take 1,000 Units by mouth 2 (two) times daily.   Yes [provider]  clopidogrel (PLAVIX) 75 MG tablet Take 1 tablet (75 mg total) by mouth daily. Patient taking differently: Take 75 mg by mouth every evening. 06/04/21  Yes Wieting, Richard, MD  docusate sodium (COLACE) 100 MG capsule Take 1 capsule (100 mg total) by mouth 2 (two) times daily as needed for mild constipation. 01/23/22  Yes Sunnie NielsenAlexander, Natalie, DO  empagliflozin (JARDIANCE) 10 MG TABS tablet Take 1 tablet (10 mg total) by mouth daily. 02/03/22 03/05/22 Yes Arrien, York RamMauricio Daniel, MD  feeding supplement (ENSURE ENLIVE / ENSURE PLUS) LIQD Take 237 mLs by mouth 3 (three) times daily between meals. 01/09/22  Yes Hollice EspyKrishnan, Sendil K, MD  furosemide (LASIX) 40 MG tablet Take 1 tablet (40 mg total) by mouth daily. 02/03/22 03/05/22 Yes Arrien, York RamMauricio Daniel, MD  ipratropium-albuterol (DUONEB) 0.5-2.5 (3) MG/3ML SOLN Take 3 mLs by nebulization every 6 (six) hours as needed (shortness of breath). 08/29/21  Yes [provider]  loperamide (IMODIUM) 2 MG capsule Take 1 capsule (2 mg total) by mouth as needed for diarrhea or loose stools. Patient taking differently: Take 2 mg by mouth daily as needed for diarrhea or loose stools. 01/23/22  Yes Sunnie NielsenAlexander, Natalie, DO  metoprolol succinate  (TOPROL-XL) 25 MG 24 hr tablet Take 1 tablet (25 mg total) by mouth daily. 01/24/22  Yes Sunnie NielsenAlexander, Natalie, DO  Multiple Vitamin (MULTIVITAMIN WITH MINERALS) TABS tablet Take 1 tablet by mouth every evening.   Yes [provider]  ondansetron (ZOFRAN-ODT) 4 MG disintegrating tablet Take 4 mg by mouth every 8 (eight) hours as needed for nausea or vomiting. 11/23/21  Yes [provider]  pantoprazole (PROTONIX) 40 MG tablet Take 1 tablet (40 mg total) by mouth daily. 01/24/22  Yes Sunnie NielsenAlexander, Natalie, DO  polyethylene glycol (MIRALAX / GLYCOLAX) 17 g packet Take 17 g by mouth daily as needed for moderate constipation. 01/23/22  Yes Sunnie NielsenAlexander, Natalie, DO  spironolactone (ALDACTONE) 25 MG tablet Take 12.5 mg by mouth every evening. 09/05/21  Yes [provider]  nitroGLYCERIN (NITROSTAT) 0.4 MG SL tablet Place 0.4 mg under the tongue every 5 (five) minutes as needed for chest pain. Patient not taking: Reported on 02/06/2022    [provider]    Physical Exam: Vitals:   02/06/22 0630 02/06/22 0700 02/06/22 0709 02/06/22 0730  BP: (!) 146/50 (!) 153/51  (!) 154/57  Pulse: 66 70 68 71  Resp: (!) 21 (!) 34 19 (!) 36  Temp:  (!) 96.8 F (36 C) (!) 96.8 F (36 C) (!) 96.9 F (36.1 C)  TempSrc:      SpO2: 100% 100% 100% 100%   Exam  Constitutional: Elderly female who appears to be fatigued Eyes: PERRL, lids and conjunctivae  ENMT: Mucous membranes are dry. Neck: normal, supple Respiratory: Tachycardia with crackles noted in the mid to lower lung fields.  Currently on BiPAP.  Only able to talk in short sentences. Cardiovascular: Regular rate and rhythm  No extremity edema. 2+ pedal pulses.  Abdomen: no tenderness, no masses palpated.  Bowel sounds positive.  Musculoskeletal: no clubbing / cyanosis. No joint deformity upper and lower extremities.  Skin: no rashes, lesions, ulcers.   Neurologic: CN 2-12 grossly intact.  Weakness noted to bilateral lower  extremities. Psychiatric: Normal judgment and insight.  Lethargic and oriented x 3. Normal mood.   Data Reviewed:  EKG reveals sinus rhythm at 98 bpm with minimal ST elevations noted in the anterior leads and  Assessment and Plan: Acute on chronic respiratory failure with hypoxia and hypercapnia secondary to acute on chronic systolic congestive heart failure exacerbation Patient presented with complaints of shortness of breath.  Noted to be hypoxic down to 84% with EMS on 4 L of nasal cannula oxygen.  She had just recently been discharged on 2 L of nasal cannula oxygen.  On physical exam patient without lower extremity edema.  Chest ray showing cardiomegaly with diffuse bilateral pleural effusions.  BNP elevated at 2726.8.  Patient has been given Lasix 40 mg IV.  Patient had just been discharged from hospital for the same. -Admit to a progressive bed -Continuous pulse oximetry with nasal cannula oxygen maintain O2 saturations -Check venous blood gas(pH 7.337, PCO2 71.6, PO2 36) -Strict I&O's and daily weights -N.p.o. while on BiPAP -Lasix 40 mg IV twice daily -Continue metoprolol po once able -Patient had been on spironolactone and ACE/ARB has been held due to the prior reports of hypotension -Cardiology consulted,  will follow-up for further recommendations  SIRS Patient was noted to be tachypneic with WBC elevated at 16.3.  Blood cultures have been obtained and patient was started on empiric antibiotics of vancomycin and cefepime.  Chest x-ray noted concern for pulmonary edema with atelectasis versus consolidation.  It was reported patient felt warm to the touch, she has been afebrile here. -Follow-up COVID-19 screening -Follow-up blood cultures -Check procalcitonin -Check urinalysis  Elevated troponin CADrecent arrest  High-sensitivity troponins initially 46 ->145.  Patient with prior history of CABG and PCI.  Recent V-fib arrest in July. -Continue to trend cardiac  troponins  Controlled diabetes mellitus type 2 without long-term use of insulin, with hyperglycemia On admission glucose elevated up to 272.  Last hemoglobin A1c 6.8 on 12/06/2021.   Home regimen includes Jardiance 10 mg daily.  Patient had been given IV steroids as a likely cause for hyperglycemia. -Hypoglycemic protocols -Continue Jardiance  -CBGs every 6 hours with sensitive SSI   Hyponatremia  Acute. Sodium 134. -Continue to monitor  Anemia of chronic kidney disease Hemoglobin 9.6 g/dL which appears near patient's baseline. -Recheck CBC tomorrow morning  CKD stage IIIa Patient creatinine 1.09 which appears around patient's baseline. -Continue to monitor with diuresis  Hyperlipidemia -Continue atorvastatin  DVT prophylaxis: Lovenox, but may warrant switching to heparin Advance Care Planning:   Code Status: Full Code. Patient confirmed least wanted attempt at resuscitation as noted on MOST  form from 8/11. Consults: Cardiology  Family Communication: Nephew updated over the phone   Severity of Illness: The appropriate patient status for this patient is INPATIENT. Inpatient status is judged to be reasonable and necessary in order to provide the required intensity of service to ensure the patient's safety. The patient's presenting symptoms, physical exam findings, and initial radiographic and laboratory data in the context of their chronic comorbidities is felt to place them at high risk for further clinical deterioration. Furthermore, it is not anticipated that the patient will be medically stable for discharge from the hospital within 2 midnights of admission.   * I certify that at the point of admission it is my clinical judgment that the patient will require inpatient hospital care spanning beyond 2 midnights from the point of admission due to high intensity of service, high risk for further deterioration and high frequency of surveillance required.*  Author: Norval Morton,  MD 02/06/2022 7:56 AM  For on call review [email or web address].

## 2022-02-06 NOTE — Progress Notes (Signed)
RT NOTE: patient taken off of bipap and placed on 4L [place].  Currently tolerating well.  Will continue to monitor.

## 2022-02-06 NOTE — ED Notes (Signed)
Riley Lam (nephew) and/or [name] (daughter) would like to be contacted about pt status. Riley Lam phone is [phone], and [name] phone is [phone].

## 2022-02-06 NOTE — Progress Notes (Signed)
Transported pt. From ed to 3E via [facility] with no incident.Marland Kitchen

## 2022-02-06 NOTE — Consult Note (Addendum)
Cardiology Consultation:   Patient ID: CHANTELLE VERDI MRN: [id number]; DOB: 04/08/1941  Admit date: 02/06/2022 Date of Consult: 02/06/2022  PCP:  [name], MD   [facility] Providers Cardiologist:  None       Dr. Clayborn [facility]  Patient Profile:   VIRGIN ZELLERS is a 81 y.o. female with a hx of CAD s/p CABG in 2018, ICM , CKD-3, HLD, DM-2, GERD who is being seen 02/06/2022 for the evaluation of acute CHF wit ICM at the request of Dr. Tamala Julian.  History of Present Illness:   Ms. Carbary PMH of CAD s/p CABG x2 in 2018 (patent LIMA to LAD,  occluded SVG to OM1 by [facility] 05/2021), h/o PCI x1 distal RCA (50% ISR by [facility] 05/2021), ischemic CM with LVEF with moderate hypo of LV mid apical anterolateral wall, 55-60%, g1dd, mod MR 12/2021, CKD 3, hyperlipidemia, type 2 diabetes, CAD, GERD who was recently hospitalized at [facility] 12/06/21 to 12/10/21 treated medically pk troponin 1593.   Readmitted from 7/12 - 7/18 initially for shortness of breath and developed chest pain, VF arrest with successful resuscitation and her NSTEMI was treated medically. Was on vent initially but weaned.  Readmitted 01/11/22 with acute encephalopathy N, V and was intubated, to protect airway and hypoxia.  EKG that day with new LBBB, SB ST depression lateral leads.  Hs troponin elevated but may have been coming down from her NSTEMI post arrest.  Pk troponin 6,377 Echo 01/04/22 with RWMA moderate hypokinesis of LV mid apical anterolateral wall.  Mod MR,  mild to mod TR and mild A1  Follow up Echo 01/12/22 with EF 45-50% global hypokinesis, RV normal, mod pleural effusion in left lateral region.  Eventually extubated  discharged 01/23/22 to SNF for rehab on imdur 60, toprol XL 25, lisinopril 10, spironolactone 12.5, and torsemide 10 mg.  Along with ASA and plavix  Readmitted 01/29/22 with acute respiratory distress.  Placed on Bipap.  She had suddenly become SOB. She was diuresed and palliative care consult was done.   Discharged 02/03/22 to [facility].  discharged with lasix 40 daily, toprol XL 25 mg, spironolactone 25, imdur and lisinopril stopped.  Though a prn order for NTG paste every 6 hours as needed.   Along with asa and plavix.   She was DNR full scope of treatment otherwise.  Ultimate goal is discharge to [place] to be near her daughter   Today pt presented to [facility] ER from [facility] and rehab for acute SOB at 0100.  She is on chronic 02 2L. Her sp02 was 86% on 4L and CPAP placed also with nebs.  She was given NTG.  No chest pain. EMS gave amiodarone 150 mg X 2 for ventricular tach. Here on BiPap.  Pt not felt well over a few days with increasing SOB. Very little activity with SOB.   Has rec'd 40 IV lasix, Mg+ 2G, solumedrol 125 IV started on ABX  EKG:  The EKG was personally reviewed and demonstrates:  SR at 98 with intraventricular conduction delay no acute St changes from prior EKGs  Qtc 456 Telemetry:  Telemetry was personally reviewed and demonstrates:  SR  Strips from EMS wide complex at times   Na 134, k+ 3.7 glucose 272, BUN 26, Cr 1.09 alk phos 136,  BNP 2,726 hs trop 46, 145, 621, 833   Lactic acid 1.8 procalcitonin 15 PCXR IMPRESSION: 1. Continued moderate to large bilateral pleural effusions with bibasilar collapse or consolidation. 2. Stable  pulmonary vascular congestion, probable mild interstitial edema.  BP 158/52, to 129/49 P now 60-70s R 14  temp on admit R was 96.8  Now on lasix 40 every 12 hours.  Ws on IV NTG but stopped.    Resting with BiPAP  no chest pain and SOB improved. Unable to ask may questions due to BiPAP  Past Medical History:  Diagnosis Date   Chest pain 12/06/2021   Diabetes mellitus without complication ([place])    Hyperlipidemia    Hypertension    Myocardial infarction [facility])     Past Surgical History:  Procedure Laterality Date   CARDIAC SURGERY     bypass   CORONARY/GRAFT ACUTE MI REVASCULARIZATION N/A 06/01/2021   Procedure: Coronary/Graft Acute  MI Revascularization;  Surgeon: Yolonda Kida, MD;  Location: [place] [facility];  Service: Cardiovascular;  Laterality: N/A;   EYE SURGERY     cataract extraction   LEFT HEART CATH AND CORONARY ANGIOGRAPHY N/A 06/01/2021   Procedure: LEFT HEART CATH AND CORONARY ANGIOGRAPHY;  Surgeon: Yolonda Kida, MD;  Location: [place] [facility];  Service: Cardiovascular;  Laterality: N/A;     Home Medications:  Prior to Admission medications   Medication Sig Start Date End Date Taking? Authorizing Provider  acetaminophen (TYLENOL) 325 MG tablet Take 2 tablets (650 mg total) by mouth every 4 (four) hours as needed for mild pain (temp > 101.5). 01/23/22  Yes Emeterio Reeve, DO  albuterol (VENTOLIN HFA) 108 (90 Base) MCG/ACT inhaler Inhale 1-2 puffs into the lungs every 4 (four) hours as needed for shortness of breath. 08/09/21  Yes [provider]  ALPRAZolam (XANAX) 0.25 MG tablet Take 0.5 tablets (0.125 mg total) by mouth 3 (three) times daily as needed for anxiety. 02/02/22  Yes Arrien, Jimmy Picket, MD  aspirin 81 MG EC tablet Take 81 mg by mouth every evening.   Yes [provider]  atorvastatin (LIPITOR) 80 MG tablet Take 80 mg by mouth every evening. 07/06/20  Yes [provider]  Cholecalciferol 25 MCG (1000 UT) capsule Take 1,000 Units by mouth 2 (two) times daily.   Yes [provider]  clopidogrel (PLAVIX) 75 MG tablet Take 1 tablet (75 mg total) by mouth daily. Patient taking differently: Take 75 mg by mouth every evening. 06/04/21  Yes Wieting, Richard, MD  docusate sodium (COLACE) 100 MG capsule Take 1 capsule (100 mg total) by mouth 2 (two) times daily as needed for mild constipation. 01/23/22  Yes Emeterio Reeve, DO  empagliflozin (JARDIANCE) 10 MG TABS tablet Take 1 tablet (10 mg total) by mouth daily. 02/03/22 03/05/22 Yes Arrien, Jimmy Picket, MD  feeding supplement (ENSURE ENLIVE / ENSURE PLUS) LIQD Take 237 mLs by mouth 3 (three) times  daily between meals. 01/09/22  Yes Annita Brod, MD  furosemide (LASIX) 40 MG tablet Take 1 tablet (40 mg total) by mouth daily. 02/03/22 03/05/22 Yes Arrien, Jimmy Picket, MD  ipratropium-albuterol (DUONEB) 0.5-2.5 (3) MG/3ML SOLN Take 3 mLs by nebulization every 6 (six) hours as needed (shortness of breath). 08/29/21  Yes [provider]  loperamide (IMODIUM) 2 MG capsule Take 1 capsule (2 mg total) by mouth as needed for diarrhea or loose stools. Patient taking differently: Take 2 mg by mouth daily as needed for diarrhea or loose stools. 01/23/22  Yes Emeterio Reeve, DO  metoprolol succinate (TOPROL-XL) 25 MG 24 hr tablet Take 1 tablet (25 mg total) by mouth daily. 01/24/22  Yes Emeterio Reeve, DO  Multiple Vitamin (MULTIVITAMIN  WITH MINERALS) TABS tablet Take 1 tablet by mouth every evening.   Yes [provider]  ondansetron (ZOFRAN-ODT) 4 MG disintegrating tablet Take 4 mg by mouth every 8 (eight) hours as needed for nausea or vomiting. 11/23/21  Yes [provider]  pantoprazole (PROTONIX) 40 MG tablet Take 1 tablet (40 mg total) by mouth daily. 01/24/22  Yes Emeterio Reeve, DO  polyethylene glycol (MIRALAX / GLYCOLAX) 17 g packet Take 17 g by mouth daily as needed for moderate constipation. 01/23/22  Yes Emeterio Reeve, DO  spironolactone (ALDACTONE) 25 MG tablet Take 12.5 mg by mouth every evening. 09/05/21  Yes [provider]  nitroGLYCERIN (NITROSTAT) 0.4 MG SL tablet Place 0.4 mg under the tongue every 5 (five) minutes as needed for chest pain. Patient not taking: Reported on 02/06/2022    [provider]    Inpatient Medications: Scheduled Meds:  atorvastatin  80 mg Oral QPM   empagliflozin  10 mg Oral Daily   enoxaparin (LOVENOX) injection  40 mg Subcutaneous Q24H   furosemide  40 mg Intravenous BID   insulin aspart  0-9 Units Subcutaneous Q6H   metoprolol succinate  25 mg Oral Daily   sodium chloride flush  3 mL Intravenous  Q12H   Continuous Infusions:  ceFEPime (MAXIPIME) IV     [START ON 02/07/2022] vancomycin     PRN Meds: acetaminophen **OR** acetaminophen, albuterol, ALPRAZolam  Allergies:    Allergies  Allergen Reactions   Tetanus Toxoids Swelling    Tetanus and diphtheria toxids   Penicillin G Rash    Social History:   Social History   Socioeconomic History   Marital status: Divorced    Spouse name: Not on file   Number of children: Not on file   Years of education: Not on file   Highest education level: Not on file  Occupational History   Not on file  Tobacco Use   Smoking status: Former    Types: Cigarettes   Smokeless tobacco: Never  Substance and Sexual Activity   Alcohol use: Not Currently   Drug use: Not Currently   Sexual activity: Not on file  Other Topics Concern   Not on file  Social History Narrative   Not on file   Social Determinants of Health   Financial Resource Strain: Not on file  Food Insecurity: Not on file  Transportation Needs: Not on file  Physical Activity: Not on file  Stress: Not on file  Social Connections: Not on file  Intimate Partner Violence: Not on file    Family History:    Family History  Problem Relation Age of Onset   Congestive Heart Failure Mother    Diabetes Father    Breast cancer Neg Hx      ROS:  Please see the history of present illness.  General:no colds or fevers, no weight changes Skin:no rashes or ulcers HEENT:no blurred vision, no congestion CV:see HPI PUL:see HPI GI:no diarrhea constipation or melena, no indigestion GU:no hematuria, no dysuria MS:no joint pain, no claudication Neuro:no syncope, no lightheadedness Endo:+ diabetes, no thyroid disease  All other ROS reviewed and negative.     Physical Exam/Data:   Vitals:   02/06/22 1330 02/06/22 1405 02/06/22 1430 02/06/22 1435  BP: (!) 129/49  (!) 145/61   Pulse: 66 66 70 69  Resp: 13 17 (!) 29 18  Temp: 98.4 F (36.9 C)  98.3 F (36.8 C) 98.4 F  (36.9 C)  TempSrc:      SpO2:  100% 100% 100% 100%   No intake or output data in the 24 hours ending 02/06/22 1500    02/03/2022    5:04 AM 02/02/2022    4:30 AM 02/01/2022    3:50 AM  Last 3 Weights  Weight (lbs) 124 lb 9 oz 125 lb 1.6 oz 127 lb 10.3 oz  Weight (kg) 56.5 kg 56.745 kg 57.9 kg     There is no height or weight on file to calculate BMI.  General:  frail female with bipap, in no acute distress HEENT: normal Neck: mild JVD Vascular: No carotid bruits; Distal pulses 2+ bilaterally Cardiac:  normal S1, S2; RRR; no murmur gallup rub or click Lungs:  clear to auscultation bilaterally, no wheezing, rhonchi or rales  though difficult to hear with BiPap Abd: soft, nontender, no hepatomegaly  Ext: no edema Musculoskeletal:  No deformities, BUE and BLE strength normal and equal Skin: warm and dry  Neuro:  alert and oriented X 3 MAE follows commands, no focal abnormalities noted Psych:  Normal affect    Relevant CV Studies: Echo 01/12/22 IMPRESSIONS     1. Left ventricular ejection fraction, by estimation, is 45 to 50%. The  left ventricle has mildly decreased function. The left ventricle  demonstrates global hypokinesis. The left ventricular internal cavity size  was mildly dilated. Left ventricular  diastolic function could not be evaluated.   2. Right ventricular systolic function is normal.   3. Moderate pleural effusion in the left lateral region.   4. The mitral valve is normal in structure.   5. The aortic valve is normal in structure. Aortic valve regurgitation is  not visualized.   Conclusion(s)/Recommendation(s): Poor windows for evaluation of left  ventricular function by transthoracic echocardiography. Would recommend an  alternative means of evaluation.   FINDINGS   Left Ventricle: Left ventricular ejection fraction, by estimation, is 45  to 50%. The left ventricle has mildly decreased function. The left  ventricle demonstrates global hypokinesis.  Definity contrast agent was  given IV to delineate the left ventricular   endocardial borders. The left ventricular internal cavity size was mildly  dilated. There is no left ventricular hypertrophy. Left ventricular  diastolic function could not be evaluated.   Right Ventricle: No increase in right ventricular wall thickness. Right  ventricular systolic function is normal.   Left Atrium: Left atrial size was normal in size.   Right Atrium: Right atrial size was normal in size.   Mitral Valve: The mitral valve is normal in structure.   Tricuspid Valve: The tricuspid valve is normal in structure.   Aortic Valve: The aortic valve is normal in structure. Aortic valve  regurgitation is not visualized.   Pulmonic Valve: The pulmonic valve was normal in structure.   Aorta: The ascending aorta was not well visualized.   IAS/Shunts: No atrial level shunt detected by color flow Doppler.   Additional Comments: There is a moderate pleural effusion in the left  lateral region.  LEFT VENTRICLE  PLAX 2D  LVIDd:         4.70 cm  LVIDs:         3.40 cm  LV PW:         1.10 cm  LV IVS:        0.90 cm  LVOT diam:     2.00 cm  LVOT Area:     3.14 cm      LEFT ATRIUM         Index  LA diam:    3.00 cm 1.89 cm/m      AORTA  Ao Root diam: 2.90 cm   TRICUSPID VALVE  TR Peak grad:   28.9 mmHg  TR Vmax:        269.00 cm/s     SHUNTS  Systemic Diam: 2.00 cm   Echo 01/04/22 IMPRESSIONS     1. The left ventricle demonstrates regional wall motion abnormalities  (see scoring diagram/findings for description).   2. Right ventricular systolic function is normal. The right ventricular  size is normal.   3. Left atrial size was moderately dilated.   4. The mitral valve is normal in structure. Moderate mitral valve  regurgitation.   5. Tricuspid valve regurgitation is mild to moderate.   6. The aortic valve is normal in structure. Aortic valve regurgitation is  mild.   FINDINGS    Left Ventricle: The left ventricle demonstrates regional wall motion  abnormalities. Moderate hypokinesis of the left ventricular, mid-apical  anterolateral wall.   Right Ventricle: The right ventricular size is normal. No increase in  right ventricular wall thickness. Right ventricular systolic function is  normal.   Left Atrium: Left atrial size was moderately dilated.   Right Atrium: Right atrial size was normal in size.   Pericardium: There is no evidence of pericardial effusion.   Mitral Valve: The mitral valve is normal in structure. Moderate mitral  valve regurgitation.   Tricuspid Valve: The tricuspid valve is normal in structure. Tricuspid  valve regurgitation is mild to moderate.   Aortic Valve: The aortic valve is normal in structure. Aortic valve  regurgitation is mild.   Pulmonic Valve: The pulmonic valve was normal in structure. Pulmonic valve  regurgitation is trivial.   Aorta: The aortic root and ascending aorta are structurally normal, with  no evidence of dilitation.   IAS/Shunts: No atrial level shunt detected by color flow Doppler.   LEFT VENTRICLE  PLAX 2D  LVIDd:         4.40 cm  LVIDs:         3.20 cm  LV PW:         1.10 cm  LV IVS:        1.00 cm  LVOT diam:     2.00 cm  LVOT Area:     3.14 cm      LEFT ATRIUM         Index  LA diam:    3.40 cm 2.23 cm/m      AORTA  Ao Root diam: 2.90 cm      SHUNTS  Systemic Diam: 2.00 cm   Serafina Royals MD  Electronically signed by Serafina Royals MD  Signature Date/Time: 01/04/2022/4:56:35 PM      Cardiac cath 06/01/21 for NSTEMI   Mid RCA lesion is 50% stenosed.   Ost RCA lesion is 50% stenosed.   Ost LM to Ost LAD lesion is 95% stenosed.   Prox LAD to Mid LAD lesion is 100% stenosed.   Mid LAD lesion is 100% stenosed.   Origin lesion is 100% stenosed.   The left ventricular systolic function is normal.   LV end diastolic pressure is normal.   The left ventricular ejection fraction is  55-65% by visual estimate.   Conclusion Non-STEMI presentation with unstable features diffuse ST depression persistent anginal chest pain.  EKG did not meet STEMI criteria but clinically the patient presented with what appeared to be an acute coronary syndrome and cardiac cath was  recommended emergently. Left ventricular function appeared to be relatively normal greater than 55% with mild inferior hypo- Coronaries showed diffuse left main disease 95% Mid LAD 100% CTO grafted Circumflex moderate size with minor irregularities RCA large very large long ostial to distal stent widely patent except for one focal mid area of 50% in-stent restenosis TIMI-3 flow. Patient found to have severe peripheral vascular disease and right femoral artery and tortuous aorta Catheters and sheath were removed Intervention was deferred Patient was treated aggressively medically in ICU  Diagnostic Dominance: Right  Carotid disease 10/19/21 Summary:  Right Carotid: Velocities in the right ICA are consistent with a 40-59%                 stenosis.   Left Carotid: Velocities in the left ICA are consistent with a 40-59%  stenosis.   Vertebrals:  Bilateral vertebral arteries demonstrate antegrade flow.  Subclavians: Normal flow hemodynamics were seen in bilateral subclavian               arteries.     Laboratory Data:  High Sensitivity Troponin:   Recent Labs  Lab 01/29/22 2136 02/06/22 0404 02/06/22 0629 02/06/22 0933 02/06/22 1052  TROPONINIHS 293* 46* 145* 621* 833*     Chemistry Recent Labs  Lab 02/01/22 0438 02/02/22 0411 02/06/22 0404 02/06/22 0457 02/06/22 0458 02/06/22 0948  NA 131* 133* 134* 131* 129* 131*  K 4.6 3.9 3.7 3.7 3.4* 4.1  CL 93* 94* 89* 87*  --   --   CO2 32 31 32  --   --   --   GLUCOSE 90 100* 272* 276*  --   --   BUN 60* 53* 26* 29*  --   --   CREATININE 1.19* 1.04* 1.09* 1.10*  --   --   CALCIUM 8.3* 8.0* 9.2  --   --   --   GFRNONAA 46* 54* 51*  --   --   --    ANIONGAP _0 --   --   --     Recent Labs  Lab 01/31/22 0345 02/06/22 0404  PROT 5.1* 6.1*  ALBUMIN 2.9* 3.5  AST 22 35  ALT 22 23  ALKPHOS 73 136*  BILITOT 0.6 1.1   Lipids No results for input(s): "CHOL", "TRIG", "HDL", "LABVLDL", "LDLCALC", "CHOLHDL" in the last 168 hours.  Hematology Recent Labs  Lab 01/31/22 0345 02/06/22 0404 02/06/22 0457 02/06/22 0458 02/06/22 0948  WBC 10.4 16.3*  --   --   --   RBC 2.78* 3.22*  --   --   --   HGB 8.3* 9.6* 10.9* 9.5* 11.2*  HCT 25.6* 29.9* 32.0* 28.0* 33.0*  MCV 92.1 92.9  --   --   --   MCH 29.9 29.8  --   --   --   MCHC 32.4 32.1  --   --   --   RDW 14.9 14.6  --   --   --   PLT 246 336  --   --   --    Thyroid No results for input(s): "TSH", "FREET4" in the last 168 hours.  BNP Recent Labs  Lab 02/06/22 0404  BNP 2,726.8*    DDimer No results for input(s): "DDIMER" in the last 168 hours.   Radiology/Studies:  DG Chest Portable 1 View  Result Date: 02/06/2022 CLINICAL DATA:  81 year old female with shortness of breath. EXAM: PORTABLE CHEST 1 VIEW COMPARISON:  Portable chest 01/29/2022 and earlier.  FINDINGS: Portable AP semi upright view at 0417 hours. Continued moderate to large bilateral veiling lower lung opacity. Obscured diaphragm as before. No air bronchograms or pneumothorax identified. Upper lung pulmonary vascularity appears stable, mild congestion. Stable cardiac size and mediastinal contours. Previous sternotomy. Paucity of bowel gas in the upper abdomen. No acute osseous abnormality identified. IMPRESSION: 1. Continued moderate to large bilateral pleural effusions with bibasilar collapse or consolidation. 2. Stable pulmonary vascular congestion, probable mild interstitial edema. Electronically Signed   By: Genevie Ann M.D.   On: 02/06/2022 05:29     Assessment and Plan:   Acute diastolic CHF with pl effusion/ICM  - 5th admit this summer for acute diastolic CHF. Was on lasix as outpt. She is in rehab unsure if  salt intake is high.  She has rec'd 40 mg IV lasix but no I&O.  Is on jardiance, lasix, BB spironolactone. Has had ACE in past but stopped.  Plan for GDMT.  Elevated troponin from CHF Vs on going ischemia.  Recent MI X 2 with presentation 11/2021 and 12/2021 with acute SOB and proceeded that evening with VF arrest, she was defibrillated with return to VT then synchronized cardioversion to SR and IV amio given.  Treated medically for NSTEMI.  Pk trop 6, 377 -  continue ASA and plavix  possible IV heaprin CAD with hx of CABG 2018 2 vessel with VG down and patent LIMA in 2022.  RCA with stents 50% ISR.   Possible VT though with strips in room wide complex MD to review.  Palliative care has seen last admit was DNR   SIRS - neg lactic acid and procalcitonin blood cultures pending.  Anemia of chronic disease/DM per IM CKD 3a monitor HLD on statin continue   Risk Assessment/Risk Scores:     TIMI Risk Score for Unstable Angina or Non-ST Elevation MI:   The patient's TIMI risk score is 5, which indicates a 26% risk of all cause mortality, new or recurrent myocardial infarction or need for urgent revascularization in the next 14 days.  New York Heart Association (NYHA) Functional Class NYHA Class IV        For questions or updates, please contact [facility] Please consult [email or web address] for contact info under    Signed, Cecilie Kicks, NP  02/06/2022 3:00 PM   I have seen and examined the patient along with Cecilie Kicks, NP .  I have reviewed the chart, notes and new data.  I agree with PA/NP's note.  Key new complaints: she is on BiPAP, only able to offer limited responses. Denies ongoing angina and reports improved dyspnea. Reviewed the 4 recent admissions w NSTEMI, VT/VF arrest, recurrent HF Key examination changes: a few basal lung rales, reduced breath sounds in both bases. RRR, hard to hear CV exam due to BiPAP, but there is an apical holosystolic murmur. No edema, no JVD. Key new  findings / data: abnormal troponin, rising. ECG shows transient RBBB (probably occurring during a period of SVT or atrial flutter with 2:1 AV block at 144 bpm) and some worsening ST depression in the lateral precordial leads. Reviewed her echocardiograms. These show at least moderate hypokinesis in the right coronary territory. MR is at least moderate and may be severe (eccentric posteriorly directed jet, heavily shadowed by the mitral annulus calcification).  PLAN: It appears that Mrs. Hendley has a substantial area of ischemic myocardium with "piece-meal" infarctions, ischemic ventricular arrhythmia and may have intermittent severe mitral insufficiency with ischemic mechanism that underlies her  CHF exacerbation episodes. Reviewed the last coronary angiograms w interventional Cardiology, Dr. Burt Knack. The left coronary is severely diseased and feeds the left circumflex (relatively small) and LAD occluded after 1st septal and 1st diag. Patent LIMA to large territory distal LAD. The right coronary is large, has several stents (probably overlapping ) and moderate restenosis. Progressive disease in RCA could explain her recent escalating clinical events and may be amenable to PCI. Unlikely to benefit from PCI to left main. If she is able to come off BiPAP, plan repeat cardiac cath and possible PCI tomorrow. Will keep on IV heparin and add IV NTG. Continue beta blocker, ASA/clopidogrel and IV loop diuretic. This procedure has been fully reviewed with the patient and informed consent has been obtained.   Sanda Klein, MD, [place] [phone] 02/06/2022, 3:17 PM

## 2022-02-06 NOTE — ED Notes (Signed)
Troponin  145. Dr.Smith made aware.

## 2022-02-06 NOTE — Progress Notes (Signed)
   02/06/22 0357  BiPAP/CPAP/SIPAP  $ Non-Invasive Ventilator  Non-Invasive Vent Initial  $ Face Mask Large  Yes  $ Face Mask Medium Yes  BiPAP/CPAP/SIPAP Pt Type Adult  Mask Type Full face mask  Mask Size Medium  Set Rate 10 breaths/min  IPAP 14 cmH20  EPAP 7 cmH2O  Oxygen Percent 100 %  BiPAP/CPAP/SIPAP BiPAP  Patient Home Equipment No  Auto Titrate Yes  Press High Alarm 25 cmH2O  Press Low Alarm 5 cmH2O  BiPAP/CPAP /SiPAP Vitals  Pulse Rate 99  Resp (!) 33  SpO2 99 %  MEWS Score/Color  MEWS Score 2  MEWS Score Color Yellow   Placed pt. On bipap due to respiratory distress per md order.

## 2022-02-06 NOTE — ED Provider Notes (Signed)
[place] EMERGENCY DEPARTMENT Provider Note   CSN: [id number] Arrival date & time: 02/06/22  [id number]     History  No chief complaint on file.   Lindsey Pollard is a 81 y.o. female.  Level 5 caveat for respiratory distress.  Patient brought in by EMS on CPAP.  Facility called out for difficulty breathing and came on suddenly about 1 AM.  She wears 2 L of oxygen at baseline and was saturating in mid 80s.  Oxygen was increased to 4 L.  Patient placed on CPAP by EMS and given bronchodilators and nitroglycerin.  Denies chest pain.  Denies cough or fever.  Denies leg pain or leg swelling. Recently hospitalized for CHF exacerbation and discharged 3 days ago.  States compliance with her medications. EMS gave amiodarone 150 mg x 2 for concern for ventricular tachycardia.  past medical history of heart failure, hx of ventricular fibrillation cardiac arrest, coronary artery disease sp CABG, CKD sage 3, and T2DM. He was admitted to [facility] on 07/20 to 01/23/22 for NSTEMI EF45-50%.  She was DNR during her last admission.  The history is provided by the patient and the EMS personnel.       Home Medications Prior to Admission medications   Medication Sig Start Date End Date Taking? Authorizing Provider  acetaminophen (TYLENOL) 325 MG tablet Take 2 tablets (650 mg total) by mouth every 4 (four) hours as needed for mild pain (temp > 101.5). 01/23/22   Emeterio Reeve, DO  albuterol (VENTOLIN HFA) 108 (90 Base) MCG/ACT inhaler Inhale 1-2 puffs into the lungs every 4 (four) hours as needed for shortness of breath. 08/09/21   [provider]  ALPRAZolam Duanne Moron) 0.25 MG tablet Take 0.5 tablets (0.125 mg total) by mouth 3 (three) times daily as needed for anxiety. 02/02/22   Arrien, Jimmy Picket, MD  aspirin 81 MG EC tablet Take 81 mg by mouth every evening.    [provider]  atorvastatin (LIPITOR) 80 MG tablet Take 80 mg by mouth every evening. 07/06/20   [provider]  Cholecalciferol 25 MCG (1000 UT) capsule Take 1,000 Units by mouth 2 (two) times daily.    [provider]  clopidogrel (PLAVIX) 75 MG tablet Take 1 tablet (75 mg total) by mouth daily. Patient taking differently: Take 75 mg by mouth every evening. 06/04/21   Loletha Grayer, MD  docusate sodium (COLACE) 100 MG capsule Take 1 capsule (100 mg total) by mouth 2 (two) times daily as needed for mild constipation. 01/23/22   Emeterio Reeve, DO  empagliflozin (JARDIANCE) 10 MG TABS tablet Take 1 tablet (10 mg total) by mouth daily. 02/03/22 03/05/22  Arrien, Jimmy Picket, MD  feeding supplement (ENSURE ENLIVE / ENSURE PLUS) LIQD Take 237 mLs by mouth 3 (three) times daily between meals. 01/09/22   Annita Brod, MD  furosemide (LASIX) 40 MG tablet Take 1 tablet (40 mg total) by mouth daily. 02/03/22 03/05/22  Arrien, Jimmy Picket, MD  ipratropium-albuterol (DUONEB) 0.5-2.5 (3) MG/3ML SOLN Take 3 mLs by nebulization every 6 (six) hours as needed (shortness of breath). 08/29/21   [provider]  loperamide (IMODIUM) 2 MG capsule Take 1 capsule (2 mg total) by mouth as needed for diarrhea or loose stools. Patient taking differently: Take 2 mg by mouth daily as needed for diarrhea or loose stools. 01/23/22   Emeterio Reeve, DO  metoprolol succinate (TOPROL-XL) 25 MG 24 hr tablet Take 1 tablet (25 mg total) by mouth daily. 01/24/22  Emeterio Reeve, DO  Multiple Vitamin (MULTIVITAMIN WITH MINERALS) TABS tablet Take 1 tablet by mouth every evening.    [provider]  nitroGLYCERIN (NITROGLYN) 2 % ointment Apply 0.5 inches topically every 6 (six) hours as needed for chest pain. 01/23/22   Emeterio Reeve, DO  ondansetron (ZOFRAN-ODT) 4 MG disintegrating tablet Take 4 mg by mouth every 8 (eight) hours as needed for nausea or vomiting. 11/23/21   [provider]  pantoprazole (PROTONIX) 40 MG tablet Take 1 tablet (40 mg total) by mouth daily. Patient  taking differently: Take 40 mg by mouth every evening. 01/24/22   Emeterio Reeve, DO  polyethylene glycol (MIRALAX / GLYCOLAX) 17 g packet Take 17 g by mouth daily as needed for moderate constipation. 01/23/22   Emeterio Reeve, DO  spironolactone (ALDACTONE) 25 MG tablet Take 12.5 mg by mouth every morning. 09/05/21   [provider]      Allergies    Tetanus toxoids and Penicillin g    Review of Systems   Review of Systems  Unable to perform ROS: Severe respiratory distress    Physical Exam Updated Vital Signs BP (!) 147/43   Pulse 64   Temp (!) 96.6 F (35.9 C)   Resp 12   SpO2 100%  Physical Exam Constitutional:      General: She is in acute distress.     Appearance: Normal appearance. She is ill-appearing and diaphoretic.     Comments: Respiratory distress on CPAP, speaking in short phrases  HENT:     Head: Normocephalic and atraumatic.  Eyes:     Pupils: Pupils are equal, round, and reactive to light.  Cardiovascular:     Rate and Rhythm: Tachycardia present.     Heart sounds: No murmur heard. Pulmonary:     Effort: Respiratory distress present.     Breath sounds: Wheezing and rhonchi present.  Abdominal:     Tenderness: There is no abdominal tenderness. There is no guarding or rebound.  Musculoskeletal:        General: No swelling or tenderness.     Cervical back: Normal range of motion and neck supple.  Skin:    General: Skin is warm.     Findings: No rash.  Neurological:     General: No focal deficit present.     Mental Status: She is oriented to person, place, and time.     Cranial Nerves: No cranial nerve deficit.     Comments: Oriented to person and place, answers all questions appropriately     ED Results / Procedures / Treatments   Labs (all labs ordered are listed, but only abnormal results are displayed) Labs Reviewed  CBC WITH DIFFERENTIAL/PLATELET - Abnormal; Notable for the following components:      Result Value   WBC 16.3 (*)     RBC 3.22 (*)    Hemoglobin 9.6 (*)    HCT 29.9 (*)    Neutro Abs 13.7 (*)    Monocytes Absolute 1.1 (*)    Abs Immature Granulocytes 0.09 (*)    All other components within normal limits  COMPREHENSIVE METABOLIC PANEL - Abnormal; Notable for the following components:   Sodium 134 (*)    Chloride 89 (*)    Glucose, Bld 272 (*)    BUN 26 (*)    Creatinine, Ser 1.09 (*)    Total Protein 6.1 (*)    Alkaline Phosphatase 136 (*)    GFR, Estimated 51 (*)    All other components  within normal limits  BRAIN NATRIURETIC PEPTIDE - Abnormal; Notable for the following components:   B Natriuretic Peptide 2,726.8 (*)    All other components within normal limits  I-STAT CHEM 8, ED - Abnormal; Notable for the following components:   Sodium 131 (*)    Chloride 87 (*)    BUN 29 (*)    Creatinine, Ser 1.10 (*)    Glucose, Bld 276 (*)    Calcium, Ion 1.11 (*)    TCO2 35 (*)    Hemoglobin 10.9 (*)    HCT 32.0 (*)    All other components within normal limits  TROPONIN I (HIGH SENSITIVITY) - Abnormal; Notable for the following components:   Troponin I (High Sensitivity) 46 (*)    All other components within normal limits  SARS CORONAVIRUS 2 BY RT PCR  CULTURE, BLOOD (ROUTINE X 2)  CULTURE, BLOOD (ROUTINE X 2)  LACTIC ACID, PLASMA  I-STAT ARTERIAL BLOOD GAS, ED  TROPONIN I (HIGH SENSITIVITY)    EKG EKG Interpretation  Date/Time:  Tuesday February 06 2022 03:54:45 EDT Ventricular Rate:  98 PR Interval:  185 QRS Duration: 135 QT Interval:  357 QTC Calculation: 456 R Axis:   56 Text Interpretation: Sinus rhythm Nonspecific intraventricular conduction delay Nonspecific repol abnormality, diffuse leads Minimal ST elevation, anterior leads No significant change was found Confirmed by Glynn Octave [phone]) on 02/06/2022 4:07:20 AM  Radiology DG Chest Portable 1 View  Result Date: 02/06/2022 CLINICAL DATA:  81 year old female with shortness of breath. EXAM: PORTABLE CHEST 1 VIEW  COMPARISON:  Portable chest 01/29/2022 and earlier. FINDINGS: Portable AP semi upright view at 0417 hours. Continued moderate to large bilateral veiling lower lung opacity. Obscured diaphragm as before. No air bronchograms or pneumothorax identified. Upper lung pulmonary vascularity appears stable, mild congestion. Stable cardiac size and mediastinal contours. Previous sternotomy. Paucity of bowel gas in the upper abdomen. No acute osseous abnormality identified. IMPRESSION: 1. Continued moderate to large bilateral pleural effusions with bibasilar collapse or consolidation. 2. Stable pulmonary vascular congestion, probable mild interstitial edema. Electronically Signed   By: Odessa Fleming M.D.   On: 02/06/2022 05:29    Procedures .Critical Care  Performed by: Glynn Octave, MD Authorized by: Glynn Octave, MD   Critical care provider statement:    Critical care time (minutes):  45   Critical care time was exclusive of:  Separately billable procedures and treating other patients   Critical care was necessary to treat or prevent imminent or life-threatening deterioration of the following conditions:  Respiratory failure   Critical care was time spent personally by me on the following activities:  Development of treatment plan with patient or surrogate, discussions with consultants, evaluation of patient's response to treatment, examination of patient, ordering and review of laboratory studies, ordering and review of radiographic studies, ordering and performing treatments and interventions, pulse oximetry, re-evaluation of patient's condition and review of old charts   I assumed direction of critical care for this patient from another provider in my specialty: no     Care discussed with: admitting provider       Medications Ordered in ED Medications  ceFEPIme (MAXIPIME) 2 g in sodium chloride 0.9 % 100 mL IVPB (2 g Intravenous New Bag/Given 02/06/22 0624)  vancomycin (VANCOCIN) IVPB 1000 mg/200 mL  premix (has no administration in time range)  furosemide (LASIX) injection 40 mg (40 mg Intravenous Given 02/06/22 0409)  methylPREDNISolone sodium succinate (SOLU-MEDROL) 125 mg/2 mL injection 125 mg (125 mg Intravenous Given  02/06/22 0409)  magnesium sulfate IVPB 2 g 50 mL (0 g Intravenous Stopped 02/06/22 0511)    ED Course/ Medical Decision Making/ A&P                           Medical Decision Making Amount and/or Complexity of Data Reviewed Labs: ordered. Decision-making details documented in ED Course. Radiology: ordered and independent interpretation performed. Decision-making details documented in ED Course. ECG/medicine tests: ordered and independent interpretation performed. Decision-making details documented in ED Course.  Risk Prescription drug management. Decision regarding hospitalization.  Respiratory distress and respiratory failure with concern for volume overload and CHF exacerbation.  Denies chest pain.  EKG without acute ST elevation  Patient continues on BiPAP.  Given bronchodilators, steroids and magnesium as well as IV Lasix.  Concern for CHF exacerbation. X-ray shows bilateral pleural effusions with congestion.  Results reviewed and interpreted by me.  Breathing improved on BiPAP.  Patient confirms DNR as she had in place during previous admission. BNP elevated. Leukocytosis noted as well.  No fever.  Patient tolerating BiPAP well. Xray may have component of pneumonia so antibiotics initiated as well after cultures are obtained.  Nephew Douglas updated. He confirms DNR. Patient does not have POA but Riley Lam and daughter [name] help her make decisions.   Work of breathing improved on bipap. Denies chest pain.  Admission d/w Dr. Antionette Char.         Final Clinical Impression(s) / ED Diagnoses Final diagnoses:  Acute on chronic respiratory failure with hypoxia (HCC)  Acute on chronic systolic congestive heart failure [facility])    Rx / DC Orders ED Discharge  Orders     None         Dempsey Ahonen, Jeannett Senior, MD 02/06/22 [phone]

## 2022-02-06 NOTE — Progress Notes (Signed)
[facility]) Hospital Liaison note:  This is a pending outpatient-based Palliative Care patient. Will continue to follow for disposition.  Please call with any outpatient palliative questions or concerns.  Thank you, Abran Cantor, LPN [facility] Liaison [phone]

## 2022-02-06 NOTE — Progress Notes (Signed)
ANTICOAGULATION CONSULT NOTE - Initial Consult  Pharmacy Consult for heparin Indication: chest pain/ACS  Allergies  Allergen Reactions   Tetanus Toxoids Swelling    Tetanus and diphtheria toxids   Penicillin G Rash    Patient Measurements:   Heparin Dosing Weight: TBW  Vital Signs: Temp: 98.4 F (36.9 C) (08/15 1435) Temp Source: Rectal (08/15 0415) BP: 145/61 (08/15 1430) Pulse Rate: 69 (08/15 1435)  Labs: Recent Labs    02/06/22 0404 02/06/22 0457 02/06/22 0458 02/06/22 0629 02/06/22 0933 02/06/22 0948 02/06/22 1052  HGB 9.6* 10.9* 9.5*  --   --  11.2*  --   HCT 29.9* 32.0* 28.0*  --   --  33.0*  --   PLT 336  --   --   --   --   --   --   CREATININE 1.09* 1.10*  --   --   --   --   --   TROPONINIHS 46*  --   --  145* 621*  --  833*    Estimated Creatinine Clearance: 31.2 mL/min (A) (by C-G formula based on SCr of 1.1 mg/dL (H)).   Medical History: Past Medical History:  Diagnosis Date   Chest pain 12/06/2021   Diabetes mellitus without complication (HCC)    Hyperlipidemia    Hypertension    Myocardial infarction [facility])    Assessment: 81 YOF presenting with SOB, hx CAD, elevated troponin.  She is not on anticoagulation PTA  Goal of Therapy:  Heparin level 0.3-0.7 units/ml Monitor platelets by anticoagulation protocol: Yes   Plan:  Heparin 3400 units IV x 1, and gtt at 700 units/hr F/u 8 hour heparin level F/u cards plan  Daylene Posey, PharmD Clinical Pharmacist ED Pharmacist Phone # [phone] 02/06/2022 3:39 PM

## 2022-02-06 NOTE — Progress Notes (Signed)
Pharmacy Antibiotic Note  Lindsey Pollard is a 81 y.o. female admitted on 02/06/2022 with pneumonia.  Pharmacy has been consulted for Vancomycin dosing. WBC elevated. CrCl ~30.   Plan: -Vancomycin 500 mg IV q24h >>>Estimated AUC: 451 -Cefepime x 1 in the ED, f/u additional gram negative coverage -Trend WBC, temp, renal function  -F/U infectious work-up -Drug levels as indicated      Temp (24hrs), Avg:96.9 F (36.1 C), Min:96.6 F (35.9 C), Max:97.3 F (36.3 C)  Recent Labs  Lab 01/30/22 0903 01/30/22 1123 01/31/22 0345 02/01/22 0438 02/02/22 0411 02/06/22 0403 02/06/22 0404 02/06/22 0457  WBC 15.1*  --  10.4  --   --   --  16.3*  --   CREATININE  --    < > 1.35* 1.19* 1.04*  --  1.09* 1.10*  LATICACIDVEN  --   --   --   --   --  1.8  --   --    < > = values in this interval not displayed.    Estimated Creatinine Clearance: 31.2 mL/min (A) (by C-G formula based on SCr of 1.1 mg/dL (H)).    Allergies  Allergen Reactions   Tetanus Toxoids Swelling    Tetanus and diphtheria toxids   Penicillin G Rash   Abran Duke, PharmD, BCPS Clinical Pharmacist Phone: [phone]

## 2022-02-06 NOTE — ED Notes (Signed)
PLACED A PUR WICK 

## 2022-02-07 ENCOUNTER — Encounter (HOSPITAL_COMMUNITY)
Admission: EM | Disposition: A | Discharge status: Hospice | Payer: Self-pay | Source: Home / Self Care | Attending: Internal Medicine

## 2022-02-07 DIAGNOSIS — I5043 Acute on chronic combined systolic (congestive) and diastolic (congestive) heart failure: Secondary | ICD-10-CM | POA: Diagnosis not present

## 2022-02-07 DIAGNOSIS — I25110 Atherosclerotic heart disease of native coronary artery with unstable angina pectoris: Secondary | ICD-10-CM | POA: Diagnosis not present

## 2022-02-07 DIAGNOSIS — I25810 Atherosclerosis of coronary artery bypass graft(s) without angina pectoris: Secondary | ICD-10-CM

## 2022-02-07 DIAGNOSIS — J9621 Acute and chronic respiratory failure with hypoxia: Secondary | ICD-10-CM | POA: Diagnosis not present

## 2022-02-07 DIAGNOSIS — E119 Type 2 diabetes mellitus without complications: Secondary | ICD-10-CM | POA: Diagnosis not present

## 2022-02-07 HISTORY — PX: INTRAVASCULAR ULTRASOUND/IVUS: CATH118244

## 2022-02-07 HISTORY — PX: LEFT HEART CATH AND CORS/GRAFTS ANGIOGRAPHY: CATH118250

## 2022-02-07 LAB — URINE CULTURE: Culture: <10000 — AB

## 2022-02-07 LAB — POCT ACTIVATED CLOTTING TIME
Activated Clotting Time: 203 seconds
Activated Clotting Time: 239 seconds
Activated Clotting Time: 323 seconds
Activated Clotting Time: 251 seconds
Activated Clotting Time: 365 seconds
Activated Clotting Time: 245 seconds
Activated Clotting Time: 245 seconds
Activated Clotting Time: 233 seconds
Activated Clotting Time: 215 seconds
Activated Clotting Time: 197 seconds
Activated Clotting Time: 197 seconds

## 2022-02-07 LAB — CBC
HCT: 25.5 % — ABNORMAL LOW (ref 36.0–46.0)
Hemoglobin: 8.2 g/dL — ABNORMAL LOW (ref 12.0–15.0)
MCH: 29.7 pg (ref 26.0–34.0)
MCHC: 32.2 g/dL (ref 30.0–36.0)
MCV: 92.4 fL (ref 80.0–100.0)
Platelets: 231 10*3/uL (ref 150–400)
RBC: 2.76 MIL/uL — ABNORMAL LOW (ref 3.87–5.11)
RDW: 14.9 % (ref 11.5–15.5)
WBC: 12.8 10*3/uL — ABNORMAL HIGH (ref 4.0–10.5)
nRBC: 0 % (ref 0.0–0.2)

## 2022-02-07 LAB — BASIC METABOLIC PANEL
Anion gap: 9 (ref 5–15)
BUN: 35 mg/dL — ABNORMAL HIGH (ref 8–23)
CO2: 34 mmol/L — ABNORMAL HIGH (ref 22–32)
Calcium: 8.9 mg/dL (ref 8.9–10.3)
Chloride: 89 mmol/L — ABNORMAL LOW (ref 98–111)
Creatinine, Ser: 1.02 mg/dL — ABNORMAL HIGH (ref 0.44–1.00)
GFR, Estimated: 56 mL/min — ABNORMAL LOW (ref >60)
Glucose, Bld: 150 mg/dL — ABNORMAL HIGH (ref 70–99)
Potassium: 4 mmol/L (ref 3.5–5.1)
Sodium: 132 mmol/L — ABNORMAL LOW (ref 135–145)

## 2022-02-07 LAB — GLUCOSE, CAPILLARY
Glucose-Capillary: 169 mg/dL — ABNORMAL HIGH (ref 70–99)
Glucose-Capillary: 158 mg/dL — ABNORMAL HIGH (ref 70–99)
Glucose-Capillary: 119 mg/dL — ABNORMAL HIGH (ref 70–99)

## 2022-02-07 LAB — HEPARIN LEVEL (UNFRACTIONATED): Heparin Unfractionated: 0.62 IU/mL (ref 0.30–0.70)

## 2022-02-07 SURGERY — LEFT HEART CATH AND CORS/GRAFTS ANGIOGRAPHY
Anesthesia: LOCAL

## 2022-02-07 MED ORDER — LABETALOL HCL 5 MG/ML IV SOLN: 10.0000 mg | INTRAVENOUS | Status: AC | PRN

## 2022-02-07 MED ORDER — SODIUM CHLORIDE 0.9% FLUSH
3.0000 mL | Freq: Two times a day (BID) | INTRAVENOUS | Status: DC
  Administered 2022-02-07 – 2022-02-15 (×13): 3 mL via INTRAVENOUS

## 2022-02-07 MED ORDER — FENTANYL CITRATE (PF) 100 MCG/2ML IJ SOLN
INTRAMUSCULAR | Status: AC
  Filled 2022-02-07: qty 2

## 2022-02-07 MED ORDER — ASPIRIN 81 MG PO CHEW
81.0000 mg | CHEWABLE_TABLET | Freq: Every day | ORAL | Status: DC
  Administered 2022-02-08 – 2022-02-16 (×9): 81 mg via ORAL
  Filled 2022-02-07 (×9): qty 1

## 2022-02-07 MED ORDER — HYDRALAZINE HCL 20 MG/ML IJ SOLN: 10.0000 mg | INTRAMUSCULAR | Status: AC | PRN

## 2022-02-07 MED ORDER — HEPARIN SODIUM (PORCINE) 1000 UNIT/ML IJ SOLN
INTRAMUSCULAR | Status: AC
  Filled 2022-02-07: qty 10

## 2022-02-07 MED ORDER — ALPRAZOLAM 0.25 MG PO TABS
0.2500 mg | ORAL_TABLET | Freq: Once | ORAL | Status: AC
  Administered 2022-02-07: 0.25 mg via ORAL
  Filled 2022-02-07: qty 1

## 2022-02-07 MED ORDER — FENTANYL CITRATE (PF) 100 MCG/2ML IJ SOLN
INTRAMUSCULAR | Status: DC | PRN
  Administered 2022-02-07 (×2): 25 ug via INTRAVENOUS

## 2022-02-07 MED ORDER — SODIUM CHLORIDE 0.9 % IV SOLN: 250.0000 mL | INTRAVENOUS | Status: DC | PRN

## 2022-02-07 MED ORDER — MIDAZOLAM HCL 2 MG/2ML IJ SOLN
INTRAMUSCULAR | Status: DC | PRN
  Administered 2022-02-07 (×2): 1 mg via INTRAVENOUS

## 2022-02-07 MED ORDER — MIDAZOLAM HCL 2 MG/2ML IJ SOLN
INTRAMUSCULAR | Status: AC
  Filled 2022-02-07: qty 2

## 2022-02-07 MED ORDER — HEPARIN (PORCINE) IN NACL 1000-0.9 UT/500ML-% IV SOLN
INTRAVENOUS | Status: DC | PRN
  Administered 2022-02-07 (×3): 500 mL

## 2022-02-07 MED ORDER — CLOPIDOGREL BISULFATE 300 MG PO TABS
ORAL_TABLET | ORAL | Status: DC | PRN
  Administered 2022-02-07: 300 mg via ORAL

## 2022-02-07 MED ORDER — SODIUM CHLORIDE 0.9 % IV SOLN: INTRAVENOUS | Status: AC

## 2022-02-07 MED ORDER — ACETAMINOPHEN 325 MG PO TABS
650.0000 mg | ORAL_TABLET | ORAL | Status: DC | PRN
  Administered 2022-02-07 – 2022-02-15 (×2): 650 mg via ORAL
  Filled 2022-02-07 (×2): qty 2

## 2022-02-07 MED ORDER — LIDOCAINE HCL (PF) 1 % IJ SOLN
INTRAMUSCULAR | Status: AC
  Filled 2022-02-07: qty 30

## 2022-02-07 MED ORDER — IOHEXOL 350 MG/ML SOLN
INTRAVENOUS | Status: DC | PRN
  Administered 2022-02-07: 135 mL

## 2022-02-07 MED ORDER — HEPARIN (PORCINE) IN NACL 1000-0.9 UT/500ML-% IV SOLN
INTRAVENOUS | Status: AC
  Filled 2022-02-07: qty 500

## 2022-02-07 MED ORDER — CLOPIDOGREL BISULFATE 300 MG PO TABS
ORAL_TABLET | ORAL | Status: AC
  Filled 2022-02-07: qty 1

## 2022-02-07 MED ORDER — HEPARIN SODIUM (PORCINE) 1000 UNIT/ML IJ SOLN
INTRAMUSCULAR | Status: DC | PRN
  Administered 2022-02-07 (×3): 3000 [IU] via INTRAVENOUS
  Administered 2022-02-07: 2000 [IU] via INTRAVENOUS

## 2022-02-07 MED ORDER — CLOPIDOGREL BISULFATE 75 MG PO TABS
75.0000 mg | ORAL_TABLET | Freq: Every day | ORAL | Status: DC
  Administered 2022-02-08 – 2022-02-16 (×9): 75 mg via ORAL
  Filled 2022-02-07 (×9): qty 1

## 2022-02-07 MED ORDER — HEPARIN (PORCINE) IN NACL 1000-0.9 UT/500ML-% IV SOLN
INTRAVENOUS | Status: AC
  Filled 2022-02-07: qty 1000

## 2022-02-07 MED ORDER — SODIUM CHLORIDE 0.9% FLUSH
3.0000 mL | INTRAVENOUS | Status: DC | PRN
Start: 2022-02-07 — End: 2022-02-16

## 2022-02-07 MED ORDER — ONDANSETRON HCL 4 MG/2ML IJ SOLN: 4.0000 mg | Freq: Four times a day (QID) | INTRAMUSCULAR | Status: DC | PRN

## 2022-02-07 MED ORDER — LIDOCAINE HCL (PF) 1 % IJ SOLN
INTRAMUSCULAR | Status: DC | PRN
  Administered 2022-02-07: 15 mL

## 2022-02-07 SURGICAL SUPPLY — 35 items
BALLN EMERGE MR 3.0X8 (BALLOONS) ×2
BALLN SAPPHIRE 3.5X15 (BALLOONS) ×2
BALLN SCOREFLEX 3.50X10 (BALLOONS) ×2
BALLOON EMERGE MR 3.0X8 (BALLOONS) IMPLANT
BALLOON SAPPHIRE 3.5X15 (BALLOONS) IMPLANT
BALLOON SCOREFLEX 3.50X10 (BALLOONS) IMPLANT
CATH INFINITI 5 FR IM (CATHETERS) ×1 IMPLANT
CATH INFINITI 6F ANG MULTIPACK (CATHETERS) ×1 IMPLANT
CATH LAUNCHER 6FR AL.75 (CATHETERS) ×1 IMPLANT
CATH LAUNCHER 6FR AL1 (CATHETERS) IMPLANT
CATH LAUNCHER 6FR JR4 (CATHETERS) ×1 IMPLANT
CATH OPTICROSS HD (CATHETERS) ×1 IMPLANT
CATH SHOCKWAVE 4.0X12 (CATHETERS) IMPLANT
CATH TELESCOPE 6F GEC (CATHETERS) ×1 IMPLANT
CATHETER LAUNCHER 6FR AL1 (CATHETERS) ×2
CATHETER SHOCKWAVE 4.0X12 (CATHETERS) ×2
ELECT DEFIB PAD ADLT CADENCE (PAD) ×1 IMPLANT
GUIDEWIRE VAS SION BLUE 190 (WIRE) ×1 IMPLANT
KIT ENCORE 26 ADVANTAGE (KITS) ×1 IMPLANT
KIT HEART LEFT (KITS) ×2 IMPLANT
PACK CARDIAC CATHETERIZATION (CUSTOM PROCEDURE TRAY) ×2 IMPLANT
PROTECTION STATION PRESSURIZED (MISCELLANEOUS) ×2
SHEATH BRITE TIP 6FR 35CM (SHEATH) ×1 IMPLANT
SHEATH PINNACLE 5F 10CM (SHEATH) ×1 IMPLANT
SHEATH PINNACLE 6F 10CM (SHEATH) ×1 IMPLANT
SHEATH PROBE COVER 6X72 (BAG) ×2 IMPLANT
SLED PULL BACK IVUS (MISCELLANEOUS) ×1 IMPLANT
STATION PROTECTION PRESSURIZED (MISCELLANEOUS) IMPLANT
TRANSDUCER W/STOPCOCK (MISCELLANEOUS) ×2 IMPLANT
TUBING CIL FLEX 10 FLL-RA (TUBING) ×2 IMPLANT
VALVE GUARDIAN II ~~LOC~~ HEMO (MISCELLANEOUS) ×1 IMPLANT
WIRE EMERALD 3MM-J .035X150CM (WIRE) ×1 IMPLANT
WIRE EMERALD 3MM-J .035X260CM (WIRE) ×1 IMPLANT
WIRE MICRO SET SILHO 5FR 7 (SHEATH) ×1 IMPLANT
WIRE MICROINTRODUCER 60CM (WIRE) ×1 IMPLANT

## 2022-02-07 NOTE — Progress Notes (Signed)
PROGRESS NOTE    Lindsey Pollard  [id number] DOB: 06/02/1941 DOA: 02/06/2022 PCP: [name], MD  81/F with history of CAD, CABG, ischemic cardiomyopathy, CKD 3 AAA, type 2 diabetes mellitus, recent hospitalizations with NSTEMI, V-fib arrest, ventilator dependent respiratory failure followed by frequent admissions with CHF Back in the ED with worsening dyspnea X 2 days, respiratory distress placed on BiPAP, BNP was 2726, troponin up to 833, chest x-ray noted moderate to large bilateral pleural effusions, interstitial edema  Subjective: -Feels a little better but is tired  Assessment and Plan:  Acute on chronic respiratory failure with hypoxia  Acute on chronic systolic CHF -Required BiPAP on admission yesterday, now off -Concern for progressive CAD causing recurrent pulmonary edema -Last echo 7/23 with EF 45% -Continue IV Lasix, metoprolol, Jardiance -BMP in a.m.  NSTEMI -Multiple recent admissions with same, troponin peaked at 833, on heparin and nitro gtt. -Cards following, plan for left heart cath today, concerned about RCA disease contributing to flash pulmonary edema   SIRS -Likely secondary to NSTEMI and CHF, clinically do not suspect pneumonia, DC antibiotics and monitor   CADrecent arrest  -Prior CABG, see discussion above   Controlled diabetes mellitus type 2 without long-term use of insulin, with hyperglycemia -Last hemoglobin A1c 6.8 on 12/06/2021.   -Continue Jardiance  Hyponatremia  -Secondary to CHF, monitor   Anemia of chronic kidney disease Hemoglobin trending down further, will check anemia panel tomorrow   CKD stage IIIa Patient creatinine 1.09 which appears around patient's baseline. -Continue to monitor with diuresis   Hyperlipidemia -Continue atorvastatin   DVT prophylaxis: IV heparin Code Status: Was DNR last admission, will wish to be full code now, discussed with patient again to reconsider this Family Communication: Discussed with  patient in detail, no family at bedside Disposition Plan: SNF likely 2 to 3 days  Consultants:    Procedures:   Antimicrobials:    Objective: Vitals:   02/07/22 0403 02/07/22 0410 02/07/22 0555 02/07/22 1124  BP: (!) 113/52  (!) 110/49 (!) 131/53  Pulse: 76  73 74  Resp: 17  20 18   Temp:   97.8 F (36.6 C) 97.6 F (36.4 C)  TempSrc:   Oral Oral  SpO2:  100% 100% 99%  Weight:      Height:        Intake/Output Summary (Last 24 hours) at 02/07/2022 1351 Last data filed at 02/07/2022 0444 Gross per 24 hour  Intake 626.93 ml  Output 200 ml  Net 426.93 ml   Filed Weights   02/06/22 1954 02/07/22 0039  Weight: 55.5 kg 56.8 kg    Examination:  General exam: Elderly frail chronically ill female sitting up in bed, AAOx3, no distress CVS: S1-S2, regular rhythm Lungs: Decreased breath sounds to bases Abdomen: Soft, nontender, bowel sounds present Extremities: No edema Psychiatry:  Mood & affect appropriate.     Data Reviewed:   CBC: Recent Labs  Lab 02/06/22 0404 02/06/22 0457 02/06/22 0458 02/06/22 0948 02/07/22 0658  WBC 16.3*  --   --   --  12.8*  NEUTROABS 13.7*  --   --   --   --   HGB 9.6* 10.9* 9.5* 11.2* 8.2*  HCT 29.9* 32.0* 28.0* 33.0* 25.5*  MCV 92.9  --   --   --  92.4  PLT 336  --   --   --  [id number]   Basic Metabolic Panel: Recent Labs  Lab 02/01/22 0438 02/02/22 0411 02/06/22 0404 02/06/22 0457 02/06/22  7001 02/06/22 0948 02/07/22 0658  NA 131* 133* 134* 131* 129* 131* 132*  K 4.6 3.9 3.7 3.7 3.4* 4.1 4.0  CL 93* 94* 89* 87*  --   --  89*  CO2 32 31 32  --   --   --  34*  GLUCOSE 90 100* 272* 276*  --   --  150*  BUN 60* 53* 26* 29*  --   --  35*  CREATININE 1.19* 1.04* 1.09* 1.10*  --   --  1.02*  CALCIUM 8.3* 8.0* 9.2  --   --   --  8.9   GFR: Estimated Creatinine Clearance: 33.8 mL/min (A) (by C-G formula based on SCr of 1.02 mg/dL (H)). Liver Function Tests: Recent Labs  Lab 02/06/22 0404  AST 35  ALT 23  ALKPHOS 136*   BILITOT 1.1  PROT 6.1*  ALBUMIN 3.5   No results for input(s): "LIPASE", "AMYLASE" in the last 168 hours. No results for input(s): "AMMONIA" in the last 168 hours. Coagulation Profile: No results for input(s): "INR", "PROTIME" in the last 168 hours. Cardiac Enzymes: No results for input(s): "CKTOTAL", "CKMB", "CKMBINDEX", "TROPONINI" in the last 168 hours. BNP (last 3 results) No results for input(s): "PROBNP" in the last 8760 hours. HbA1C: No results for input(s): "HGBA1C" in the last 72 hours. CBG: Recent Labs  Lab 02/06/22 0905 02/06/22 1247 02/06/22 1527 02/06/22 2117 02/07/22 0630  GLUCAP 264* 227* 172* 159* 169*   Lipid Profile: No results for input(s): "CHOL", "HDL", "LDLCALC", "TRIG", "CHOLHDL", "LDLDIRECT" in the last 72 hours. Thyroid Function Tests: No results for input(s): "TSH", "T4TOTAL", "FREET4", "T3FREE", "THYROIDAB" in the last 72 hours. Anemia Panel: No results for input(s): "VITAMINB12", "FOLATE", "FERRITIN", "TIBC", "IRON", "RETICCTPCT" in the last 72 hours. Urine analysis:    Component Value Date/Time   COLORURINE YELLOW 02/06/2022 1100   APPEARANCEUR CLEAR 02/06/2022 1100   LABSPEC 1.009 02/06/2022 1100   PHURINE 5.0 02/06/2022 1100   GLUCOSEU >=500 (A) 02/06/2022 1100   HGBUR NEGATIVE 02/06/2022 1100   BILIRUBINUR NEGATIVE 02/06/2022 1100   KETONESUR NEGATIVE 02/06/2022 1100   PROTEINUR NEGATIVE 02/06/2022 1100   NITRITE NEGATIVE 02/06/2022 1100   LEUKOCYTESUR TRACE (A) 02/06/2022 1100   Sepsis Labs: @LABRCNTIP (procalcitonin:4,lacticidven:4)  ) Recent Results (from the past 240 hour(s))  Blood culture (routine x 2)     Status: None (Preliminary result)   Collection Time: 02/06/22  4:04 AM   Specimen: BLOOD  Result Value Ref Range Status   Specimen Description BLOOD SITE NOT SPECIFIED  Final   Special Requests   Final    BOTTLES DRAWN AEROBIC AND ANAEROBIC Blood Culture results may not be optimal due to an excessive volume of blood  received in culture bottles   Culture   Final    NO GROWTH 1 DAY Performed at [facility], [address] [place], [place]    Report Status PENDING  Incomplete  Blood culture (routine x 2)     Status: None (Preliminary result)   Collection Time: 02/06/22  4:18 AM   Specimen: BLOOD  Result Value Ref Range Status   Specimen Description BLOOD SITE NOT SPECIFIED  Final   Special Requests   Final    BOTTLES DRAWN AEROBIC AND ANAEROBIC Blood Culture results may not be optimal due to an excessive volume of blood received in culture bottles   Culture   Final    NO GROWTH 1 DAY Performed at [facility], [address], [place], [place] [address]    Report Status PENDING  Incomplete  SARS Coronavirus 2 by RT PCR (hospital order, performed in [facility] hospital lab) *cepheid single result test*     Status: None   Collection Time: 02/06/22  5:16 AM   Specimen: Nasal Swab  Result Value Ref Range Status   SARS Coronavirus 2 by RT PCR NEGATIVE NEGATIVE Final    Comment: (NOTE) SARS-CoV-2 target nucleic acids are NOT DETECTED.  The SARS-CoV-2 RNA is generally detectable in upper and lower respiratory specimens during the acute phase of infection. The lowest concentration of SARS-CoV-2 viral copies this assay can detect is 250 copies / mL. A negative result does not preclude SARS-CoV-2 infection and should not be used as the sole basis for treatment or other patient management decisions.  A negative result may occur with improper specimen collection / handling, submission of specimen other than nasopharyngeal swab, presence of viral mutation(s) within the areas targeted by this assay, and inadequate number of viral copies (<250 copies / mL). A negative result must be combined with clinical observations, patient history, and epidemiological information.  Fact Sheet for Patients:   RoadLapTop.co.za  Fact Sheet for Healthcare  Providers: http://kim-miller.com/  This test is not yet approved or  cleared by the [place] FDA and has been authorized for detection and/or diagnosis of SARS-CoV-2 by FDA under an Emergency Use Authorization (EUA).  This EUA will remain in effect (meaning this test can be used) for the duration of the COVID-19 declaration under Section 564(b)(1) of the Act, 21 U.S.C. section 360bbb-3(b)(1), unless the authorization is terminated or revoked sooner.  Performed at [facility], [address] [place], [place] [address]   Urine Culture     Status: Abnormal   Collection Time: 02/06/22  8:29 AM   Specimen: Urine, Clean Catch  Result Value Ref Range Status   Specimen Description URINE, CLEAN CATCH  Final   Special Requests NONE  Final   Culture (A)  Final    <10,000 COLONIES/mL INSIGNIFICANT GROWTH Performed at [facility], [address] [place], [place] [address]    Report Status 02/07/2022 FINAL  Final     Radiology Studies: DG Chest Portable 1 View  Result Date: 02/06/2022 CLINICAL DATA:  81 year old female with shortness of breath. EXAM: PORTABLE CHEST 1 VIEW COMPARISON:  Portable chest 01/29/2022 and earlier. FINDINGS: Portable AP semi upright view at 0417 hours. Continued moderate to large bilateral veiling lower lung opacity. Obscured diaphragm as before. No air bronchograms or pneumothorax identified. Upper lung pulmonary vascularity appears stable, mild congestion. Stable cardiac size and mediastinal contours. Previous sternotomy. Paucity of bowel gas in the upper abdomen. No acute osseous abnormality identified. IMPRESSION: 1. Continued moderate to large bilateral pleural effusions with bibasilar collapse or consolidation. 2. Stable pulmonary vascular congestion, probable mild interstitial edema. Electronically Signed   By: Odessa Fleming M.D.   On: 02/06/2022 05:29     Scheduled Meds:  aspirin  81 mg Oral Daily   atorvastatin  80 mg Oral QPM    clopidogrel  75 mg Oral Daily   empagliflozin  10 mg Oral Daily   furosemide  40 mg Intravenous BID   metoprolol succinate  25 mg Oral Daily   sodium chloride flush  3 mL Intravenous Q12H   sodium chloride flush  3 mL Intravenous Q12H   Continuous Infusions:  sodium chloride     sodium chloride 10 mL/hr at 02/07/22 0643   ceFEPime (  MAXIPIME) IV 2 g (02/07/22 1000)   heparin 700 Units/hr (02/07/22 0415)   nitroGLYCERIN 5 mcg/min (02/07/22 0444)   vancomycin 500 mg (02/07/22 1120)     LOS: 1 day    Time spent: 23min  Domenic Polite, MD [facility]   02/07/2022, 1:51 PM

## 2022-02-07 NOTE — Progress Notes (Signed)
Placed patient on bipap for the night with IPAP set at 12cm and EPAP set at 6cm and oxygen set at 3lpm

## 2022-02-07 NOTE — TOC Progression Note (Signed)
Transition of Care [facility]) - Progression Note    Patient Details  Name: Lindsey Pollard MRN: [id number] Date of Birth: 20-Dec-1940  Transition of Care [facility]) CM/SW Contact  Leone Haven, RN Phone Number: 02/07/2022, 11:28 AM  Clinical Narrative:    From [facility], admitted with chest pain,  Coronary Artery Syndrome, just was dc'd some days ago to [facility].  TOC will continue to follow for dc needs.         Expected Discharge Plan and Services                                                 Social Determinants of Health (SDOH) Interventions    Readmission Risk Interventions    01/18/2022    9:04 PM 01/05/2022    5:09 PM  Readmission Risk Prevention Plan  Transportation Screening Complete Complete  PCP or Specialist Appt within 3-5 Days  Complete  Social Work Consult for Recovery Care Planning/Counseling  Not Complete  SW consult not completed comments  NA  Palliative Care Screening  Not Applicable  Medication Review [employer]) Referral to Pharmacy Referral to Pharmacy  PCP or Specialist appointment within 3-5 days of discharge Complete   HRI or Home Care Consult Complete   SW Recovery Care/Counseling Consult Not Complete   SW Consult Not Complete Comments NA   Palliative Care Screening Not Applicable   Skilled Nursing Facility Complete

## 2022-02-07 NOTE — Progress Notes (Signed)
Received patient's belongings from [place]. Reading glasses, 2 charger for hearing aids, 2 blue tip hearing aids, and cell phone were inside the bag. Patient said that one hearing aid with red tip is missing either from the ER or  [facility]. Will follow up.

## 2022-02-07 NOTE — Plan of Care (Signed)
  Problem: Cardiac: Goal: Ability to achieve and maintain adequate cardiopulmonary perfusion will improve Outcome: Progressing   Problem: Skin Integrity: Goal: Risk for impaired skin integrity will decrease Outcome: Progressing   Problem: Safety: Goal: Ability to remain free from injury will improve Outcome: Progressing   Problem: Pain Managment: Goal: General experience of comfort will improve Outcome: Progressing   Problem: Cardiovascular: Goal: Ability to achieve and maintain adequate cardiovascular perfusion will improve Outcome: Progressing Goal: Vascular access site(s) Level 0-1 will be maintained Outcome: Progressing

## 2022-02-07 NOTE — Progress Notes (Signed)
TRH night cross cover note:   I was notified by RN of patient's blood pressure 92/35.  Recheck showed improvement to 113/52 without interval intervention.  Patient noted to be on nitroglycerin drip, currently running at 3 mL/h.  Per my chart review, including review of documentation from cardiology consultation yesterday, there appears to be concern for ACS, prompting initiation of heparin drip.  Additionally, the patient had been complaining of some chest pain earlier, prompting cardiology to initiate nitroglycerin drip.   Per this documentation, plan from cardiology standpoint, includes hopeful left-sided heart cath on 02/07/2022.   RN confirms that the patient does not have any residual chest discomfort at this time.  Given concern for ACS and recent chest pain that improved with nitroglycerin drip, I am hesitant to completely stop nitroglycerin drip at this time.  Consequently, will continue nitroglycerin drip, but at reduced rate of 1.3 mL/hr, with close monitoring of ensuing blood pressure and close monitoring for any recurrent chest discomfort.     Newton Pigg, DO Hospitalist

## 2022-02-07 NOTE — Progress Notes (Signed)
PT Cancellation Note  Patient Details Name: SEVYN MARKHAM MRN: [id number] DOB: [birth year]   Cancelled Treatment:    Reason Eval/Treat Not Completed: Patient at procedure or test/unavailable. Pt preparing to leave unit for cardiac cath. Pt declines PT eval at this time, requesting PT return after cath or tomorrow. PT will follow up as time allows.   Arlyss Gandy 02/07/2022, 1:10 PM

## 2022-02-07 NOTE — Progress Notes (Incomplete)
Progress Note  Patient Name: Lindsey Pollard Date of Encounter: 02/07/2022  [facility] Cardiologist: Dr. Juliann Pares (Cardiology)  Subjective   Continues to have dyspnea. No chest pain.   Inpatient Medications    Scheduled Meds: . aspirin  81 mg Oral Daily  . atorvastatin  80 mg Oral QPM  . clopidogrel  75 mg Oral Daily  . empagliflozin  10 mg Oral Daily  . furosemide  40 mg Intravenous BID  . metoprolol succinate  25 mg Oral Daily  . sodium chloride flush  3 mL Intravenous Q12H  . sodium chloride flush  3 mL Intravenous Q12H   Continuous Infusions: . sodium chloride    . sodium chloride 10 mL/hr at 02/07/22 0643  . ceFEPime (MAXIPIME) IV 2 g (02/07/22 1000)  . heparin 700 Units/hr (02/07/22 0415)  . nitroGLYCERIN 5 mcg/min (02/07/22 0444)  . vancomycin     PRN Meds: sodium chloride, acetaminophen **OR** acetaminophen, albuterol, ALPRAZolam, sodium chloride flush   Vital Signs    Vitals:   02/07/22 0400 02/07/22 0403 02/07/22 0410 02/07/22 0555  BP: (S) (!) 92/35 (!) 113/52  (!) 110/49  Pulse: 75 76  73  Resp: 20 17  20   Temp: 98.7 F (37.1 C)   97.8 F (36.6 C)  TempSrc: Oral   Oral  SpO2: 100%  100% 100%  Weight:      Height:        Intake/Output Summary (Last 24 hours) at 02/07/2022 1001 Last data filed at 02/07/2022 0444 Gross per 24 hour  Intake 626.93 ml  Output 200 ml  Net 426.93 ml      02/07/2022   12:39 AM 02/06/2022    7:54 PM 02/03/2022    5:04 AM  Last 3 Weights  Weight (lbs) 125 lb 3.5 oz 122 lb 5.7 oz 124 lb 9 oz  Weight (kg) 56.8 kg 55.5 kg 56.5 kg      Telemetry    Sinus rhythm  - Personally Reviewed  ECG    *** - Personally Reviewed  Physical Exam  *** GEN: No acute distress.   Neck: No JVD Cardiac: RRR, no murmurs, rubs, or gallops.  Respiratory: Clear to auscultation bilaterally. GI: Soft, nontender, non-distended  MS: No edema; No deformity. Neuro:  Nonfocal  Psych: Normal affect   Labs    High Sensitivity  Troponin:   Recent Labs  Lab 01/29/22 2136 02/06/22 0404 02/06/22 0629 02/06/22 0933 02/06/22 1052  TROPONINIHS 293* 46* 145* 621* 833*     Chemistry Recent Labs  Lab 02/02/22 0411 02/06/22 0404 02/06/22 0457 02/06/22 0458 02/06/22 0948 02/07/22 0658  NA 133* 134* 131* 129* 131* 132*  K 3.9 3.7 3.7 3.4* 4.1 4.0  CL 94* 89* 87*  --   --  89*  CO2 31 32  --   --   --  34*  GLUCOSE 100* 272* 276*  --   --  150*  BUN 53* 26* 29*  --   --  35*  CREATININE 1.04* 1.09* 1.10*  --   --  1.02*  CALCIUM 8.0* 9.2  --   --   --  8.9  PROT  --  6.1*  --   --   --   --   ALBUMIN  --  3.5  --   --   --   --   AST  --  35  --   --   --   --   ALT  --  23  --   --   --   --   ALKPHOS  --  136*  --   --   --   --   BILITOT  --  1.1  --   --   --   --   GFRNONAA 54* 51*  --   --   --  56*  ANIONGAP 8 13  --   --   --  9    Lipids No results for input(s): "CHOL", "TRIG", "HDL", "LABVLDL", "LDLCALC", "CHOLHDL" in the last 168 hours.  Hematology Recent Labs  Lab 02/06/22 0404 02/06/22 0457 02/06/22 0458 02/06/22 0948 02/07/22 0658  WBC 16.3*  --   --   --  12.8*  RBC 3.22*  --   --   --  2.76*  HGB 9.6*   < > 9.5* 11.2* 8.2*  HCT 29.9*   < > 28.0* 33.0* 25.5*  MCV 92.9  --   --   --  92.4  MCH 29.8  --   --   --  29.7  MCHC 32.1  --   --   --  32.2  RDW 14.6  --   --   --  14.9  PLT 336  --   --   --  231   < > = values in this interval not displayed.   Thyroid No results for input(s): "TSH", "FREET4" in the last 168 hours.  BNP Recent Labs  Lab 02/06/22 0404  BNP 2,726.8*    DDimer No results for input(s): "DDIMER" in the last 168 hours.   Radiology    DG Chest Portable 1 View  Result Date: 02/06/2022 CLINICAL DATA:  81 year old female with shortness of breath. EXAM: PORTABLE CHEST 1 VIEW COMPARISON:  Portable chest 01/29/2022 and earlier. FINDINGS: Portable AP semi upright view at 0417 hours. Continued moderate to large bilateral veiling lower lung opacity. Obscured  diaphragm as before. No air bronchograms or pneumothorax identified. Upper lung pulmonary vascularity appears stable, mild congestion. Stable cardiac size and mediastinal contours. Previous sternotomy. Paucity of bowel gas in the upper abdomen. No acute osseous abnormality identified. IMPRESSION: 1. Continued moderate to large bilateral pleural effusions with bibasilar collapse or consolidation. 2. Stable pulmonary vascular congestion, probable mild interstitial edema. Electronically Signed   By: Odessa Fleming M.D.   On: 02/06/2022 05:29    Cardiac Studies   ***  Patient Profile     81 y.o. female ***  Assessment & Plan    *** {Are we signing off today?:[id number]}  For questions or updates, please contact [facility] Please consult [email or web address] for contact info under        SignedManson Passey, PA  02/07/2022, 10:01 AM

## 2022-02-07 NOTE — Progress Notes (Signed)
ANTICOAGULATION CONSULT NOTE - Follow Up Consult  Pharmacy Consult for Heparin Indication: chest pain/ACS  Allergies  Allergen Reactions   Tetanus Toxoids Swelling    Tetanus and diphtheria toxids   Penicillin G Rash    Patient Measurements: Height: 4\' 11"  (149.9 cm) Weight: 56.8 kg (125 lb 3.5 oz) IBW/kg (Calculated) : 43.2 Heparin Dosing Weight: 56.8 kg  Vital Signs: Temp: 97.8 F (36.6 C) (08/16 0555) Temp Source: Oral (08/16 0555) BP: 110/49 (08/16 0555) Pulse Rate: 73 (08/16 0555)  Labs: Recent Labs    02/06/22 0404 02/06/22 0457 02/06/22 0458 02/06/22 0629 02/06/22 0933 02/06/22 0948 02/06/22 1052 02/07/22 0658  HGB 9.6* 10.9* 9.5*  --   --  11.2*  --  8.2*  HCT 29.9* 32.0* 28.0*  --   --  33.0*  --  25.5*  PLT 336  --   --   --   --   --   --  231  HEPARINUNFRC  --   --   --   --   --   --   --  0.62  CREATININE 1.09* 1.10*  --   --   --   --   --  1.02*  TROPONINIHS 46*  --   --  145* 621*  --  833*  --     Estimated Creatinine Clearance: 33.8 mL/min (A) (by C-G formula based on SCr of 1.02 mg/dL (H)).   Medications:  Scheduled:   aspirin  81 mg Oral Daily   atorvastatin  80 mg Oral QPM   clopidogrel  75 mg Oral Daily   empagliflozin  10 mg Oral Daily   furosemide  40 mg Intravenous BID   metoprolol succinate  25 mg Oral Daily   sodium chloride flush  3 mL Intravenous Q12H   sodium chloride flush  3 mL Intravenous Q12H   Infusions:   sodium chloride     sodium chloride 10 mL/hr at 02/07/22 0643   ceFEPime (MAXIPIME) IV Stopped (02/06/22 1851)   heparin 700 Units/hr (02/07/22 0415)   nitroGLYCERIN 5 mcg/min (02/07/22 0444)   vancomycin      Assessment: 81 yo F continues on heparin for chest pain.  Heparin level is therapeutic on 700 units/hr.  No bleeding noted.  Noted plans for cardiac cath this afternoon.  Goal of Therapy:  Heparin level 0.3-0.7 units/ml Monitor platelets by anticoagulation protocol: Yes   Plan:  Continue heparin at  700 units/hr Follow-up after cath  [facility], Pharm.D., BCPS Clinical Pharmacist Clinical phone for 02/07/2022 from 7:30-3:00 is [phone].  **Pharmacist phone directory can be found on [email or web address] listed under [facility].  02/07/2022 9:05 AM

## 2022-02-07 NOTE — TOC Initial Note (Signed)
Transition of Care [facility]) - Initial/Assessment Note    Patient Details  Name: Lindsey Pollard MRN: [id number] Date of Birth: 03/12/1941  Transition of Care [facility]) CM/SW Contact:    Leone Haven, RN Phone Number: 02/07/2022, 11:33 AM  Clinical Narrative:                 From [facility], admitted with chest pain,  Coronary Artery Syndrome, just was dc'd some days ago to [facility]  TOC will continue to follow for dc needs.  CSW aware.   Expected Discharge Plan: Skilled Nursing Facility Barriers to Discharge: Continued Medical Work up   Patient Goals and CMS Choice        Expected Discharge Plan and Services Expected Discharge Plan: Skilled Nursing Facility   Discharge Planning Services: CM Consult   Living arrangements for the past 2 months: Single Family Home                   DME Agency: NA       HH Arranged: NA          Prior Living Arrangements/Services Living arrangements for the past 2 months: Single Family Home   Patient language and need for interpreter reviewed:: Yes        Need for Family Participation in Patient Care: Yes (Comment) Care giver support system in place?: Yes (comment) Current home services:  (rollator) Criminal Activity/Legal Involvement Pertinent to Current Situation/Hospitalization: No - Comment as needed  Activities of Daily Living Home Assistive Devices/Equipment: Hearing aid, Walker (specify type) ADL Screening (condition at time of admission) Patient's cognitive ability adequate to safely complete daily activities?: Yes Is the patient deaf or have difficulty hearing?: Yes Does the patient have difficulty seeing, even when wearing glasses/contacts?: Yes Does the patient have difficulty concentrating, remembering, or making decisions?: Yes Patient able to express need for assistance with ADLs?: Yes Does the patient have difficulty dressing or bathing?: Yes Independently performs ADLs?: Yes (appropriate for  developmental age) Does the patient have difficulty walking or climbing stairs?: Yes Weakness of Legs: Both Weakness of Arms/Hands: Both  Permission Sought/Granted                  Emotional Assessment   Attitude/Demeanor/Rapport: Engaged Affect (typically observed): Accepting Orientation: : Oriented to Self, Oriented to Place, Oriented to  Time, Oriented to Situation Alcohol / Substance Use: Not Applicable Psych Involvement: No (comment)  Admission diagnosis:  Acute on chronic systolic congestive heart failure (HCC) [I50.23] Acute on chronic combined systolic (congestive) and diastolic (congestive) heart failure (HCC) [I50.43] Acute on chronic respiratory failure with hypoxia (HCC) [J96.21] Patient Active Problem List   Diagnosis Date Noted   Acute on chronic combined systolic (congestive) and diastolic (congestive) heart failure (HCC) 02/06/2022   SIRS (systemic inflammatory response syndrome) (HCC) 02/06/2022   Elevated troponin 02/06/2022   Hyponatremia 02/06/2022   Anemia due to chronic kidney disease 02/06/2022   Pressure injury of skin 02/01/2022   Acute on chronic systolic (congestive) heart failure (HCC) 01/29/2022   Fever and chills 01/17/2022   Malnutrition of moderate degree 01/11/2022   Overweight (BMI 25.0-29.9) 01/06/2022   Unstable angina (HCC) 12/07/2021   NSTEMI (non-ST elevated myocardial infarction) (HCC) 12/06/2021   Chronic kidney disease, stage 3a (HCC) 12/06/2021   Lung nodule 12/06/2021   Controlled type 2 diabetes mellitus without complication, without long-term current use of insulin (HCC) 12/06/2021   HTN (hypertension) 12/06/2021   HLD (hyperlipidemia) 12/06/2021   AKI (  acute kidney injury) (HCC) in the setting of stage IIIa chronic    Anemia    Acute on chronic respiratory failure with hypoxia (HCC)    Chronic diastolic CHF (congestive heart failure) (HCC)    Fungal infection of the groin    Fungal infection of skin    Essential  hypertension 06/01/2021   Carotid stenosis 10/26/2020   Atherosclerosis of native arteries of extremity with intermittent claudication (HCC) 09/16/2020   Carotid bruit present 09/16/2020   Healthcare maintenance 01/06/2019   Aortic calcification (HCC) 06/19/2017   Ischemic cardiomyopathy 06/19/2017   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 [place] [address] Phone: [phone] Fax: [phone]  [facility] - [place], [place] - [address] [place] [address] [place] [address] [place] [address] Phone: [phone] Fax: [phone]     Social Determinants of Health (SDOH) Interventions    Readmission Risk Interventions    02/07/2022   11:31 AM 01/18/2022    9:04 PM 01/05/2022    5:09 PM  Readmission Risk Prevention Plan  Transportation Screening Complete Complete Complete  PCP or Specialist Appt within 3-5 Days   Complete  Social Work Consult for Recovery Care Planning/Counseling   Not Complete  SW consult not completed comments   NA  Palliative Care Screening   Not Applicable  Medication Review Oceanographer) Complete Referral to Pharmacy Referral to Pharmacy  PCP or Specialist appointment within 3-5 days of discharge  Complete   HRI or Home Care Consult Complete Complete   SW Recovery Care/Counseling Consult Complete Not Complete   SW Consult Not Complete Comments  NA   Palliative Care Screening Not Applicable Not Applicable   Skilled Nursing Facility Complete Complete

## 2022-02-07 NOTE — H&P (View-Only) (Signed)
Progress Note  Patient Name: Lindsey Pollard Date of Encounter: 02/07/2022  [facility] Cardiologist: Dr. Juliann Pares (Cardiology)  Subjective   Continues to have dyspnea. No chest pain.   Inpatient Medications    Scheduled Meds:  aspirin  81 mg Oral Daily   atorvastatin  80 mg Oral QPM   clopidogrel  75 mg Oral Daily   empagliflozin  10 mg Oral Daily   furosemide  40 mg Intravenous BID   metoprolol succinate  25 mg Oral Daily   sodium chloride flush  3 mL Intravenous Q12H   sodium chloride flush  3 mL Intravenous Q12H   Continuous Infusions:  sodium chloride     sodium chloride 10 mL/hr at 02/07/22 0643   ceFEPime (MAXIPIME) IV 2 g (02/07/22 1000)   heparin 700 Units/hr (02/07/22 0415)   nitroGLYCERIN 5 mcg/min (02/07/22 0444)   vancomycin     PRN Meds: sodium chloride, acetaminophen **OR** acetaminophen, albuterol, ALPRAZolam, sodium chloride flush   Vital Signs    Vitals:   02/07/22 0400 02/07/22 0403 02/07/22 0410 02/07/22 0555  BP: (S) (!) 92/35 (!) 113/52  (!) 110/49  Pulse: 75 76  73  Resp: 20 17  20   Temp: 98.7 F (37.1 C)   97.8 F (36.6 C)  TempSrc: Oral   Oral  SpO2: 100%  100% 100%  Weight:      Height:        Intake/Output Summary (Last 24 hours) at 02/07/2022 1015 Last data filed at 02/07/2022 0444 Gross per 24 hour  Intake 626.93 ml  Output 200 ml  Net 426.93 ml      02/07/2022   12:39 AM 02/06/2022    7:54 PM 02/03/2022    5:04 AM  Last 3 Weights  Weight (lbs) 125 lb 3.5 oz 122 lb 5.7 oz 124 lb 9 oz  Weight (kg) 56.8 kg 55.5 kg 56.5 kg      Telemetry    SR - Personally Reviewed  ECG    N/A  Physical Exam   GEN: No acute distress.   Neck: No JVD Cardiac: RRR, no murmurs, rubs, or gallops.  Respiratory: Clear to auscultation bilaterally. GI: Soft, nontender, non-distended  MS: No edema; No deformity. Neuro:  Nonfocal  Psych: Normal affect   Labs    High Sensitivity Troponin:   Recent Labs  Lab 01/29/22 2136  02/06/22 0404 02/06/22 0629 02/06/22 0933 02/06/22 1052  TROPONINIHS 293* 46* 145* 621* 833*     Chemistry Recent Labs  Lab 02/02/22 0411 02/06/22 0404 02/06/22 0457 02/06/22 0458 02/06/22 0948 02/07/22 0658  NA 133* 134* 131* 129* 131* 132*  K 3.9 3.7 3.7 3.4* 4.1 4.0  CL 94* 89* 87*  --   --  89*  CO2 31 32  --   --   --  34*  GLUCOSE 100* 272* 276*  --   --  150*  BUN 53* 26* 29*  --   --  35*  CREATININE 1.04* 1.09* 1.10*  --   --  1.02*  CALCIUM 8.0* 9.2  --   --   --  8.9  PROT  --  6.1*  --   --   --   --   ALBUMIN  --  3.5  --   --   --   --   AST  --  35  --   --   --   --   ALT  --  23  --   --   --   --  ALKPHOS  --  136*  --   --   --   --   BILITOT  --  1.1  --   --   --   --   GFRNONAA 54* 51*  --   --   --  56*  ANIONGAP 8 13  --   --   --  9    Lipids No results for input(s): "CHOL", "TRIG", "HDL", "LABVLDL", "LDLCALC", "CHOLHDL" in the last 168 hours.  Hematology Recent Labs  Lab 02/06/22 0404 02/06/22 0457 02/06/22 0458 02/06/22 0948 02/07/22 0658  WBC 16.3*  --   --   --  12.8*  RBC 3.22*  --   --   --  2.76*  HGB 9.6*   < > 9.5* 11.2* 8.2*  HCT 29.9*   < > 28.0* 33.0* 25.5*  MCV 92.9  --   --   --  92.4  MCH 29.8  --   --   --  29.7  MCHC 32.1  --   --   --  32.2  RDW 14.6  --   --   --  14.9  PLT 336  --   --   --  231   < > = values in this interval not displayed.   Thyroid No results for input(s): "TSH", "FREET4" in the last 168 hours.  BNP Recent Labs  Lab 02/06/22 0404  BNP 2,726.8*     Radiology    DG Chest Portable 1 View  Result Date: 02/06/2022 CLINICAL DATA:  81 year old female with shortness of breath. EXAM: PORTABLE CHEST 1 VIEW COMPARISON:  Portable chest 01/29/2022 and earlier. FINDINGS: Portable AP semi upright view at 0417 hours. Continued moderate to large bilateral veiling lower lung opacity. Obscured diaphragm as before. No air bronchograms or pneumothorax identified. Upper lung pulmonary vascularity appears  stable, mild congestion. Stable cardiac size and mediastinal contours. Previous sternotomy. Paucity of bowel gas in the upper abdomen. No acute osseous abnormality identified. IMPRESSION: 1. Continued moderate to large bilateral pleural effusions with bibasilar collapse or consolidation. 2. Stable pulmonary vascular congestion, probable mild interstitial edema. Electronically Signed   By: Odessa Fleming M.D.   On: 02/06/2022 05:29    Cardiac Studies   Pending cath  Patient Profile     81 y.o. female  with a hx of CAD s/p CABG in 2018, ICM , CKD-3, HLD, DM-2, AF arrest, GERD who is being seen 02/06/2022 for the evaluation of acute CHF wit ICM at the request of Dr. Katrinka Blazing.  Assessment & Plan    NSTEMI - Recent multiple admission for NSTEMI and CHF exacerbation along with VF arrest. Treated medically (see consult note for details). - now presented from Rehab facility for acute SOB requiring BiPAP, now off - Hs-troponin 621>>833. On heparin and nitro drip.  - Dr. Royann Shivers and Dr. Excell Seltzer has reviewed prior cath and felt "Progressive disease in RCA could explain her recent escalating clinical events and may be amenable to PCI. Unlikely to benefit from PCI to left main" - Plan for cath today.  - Discussed findings and cath with patient and POA "[name]" for about 20 minutes and both agreed to proceed.    The patient understands that risks include but are not limited to stroke (1 in 1000), death (1 in 1000), kidney failure [usually temporary] (1 in 500), bleeding (1 in 200), allergic reaction [possibly serious] (1 in 200), and agrees to proceed.     2. ICM - Last echo 01/12/22  with LVEF of 45-50% - Continue IV lasix, Jardiance and BB - Will update GDMT based on cath   3. CAD 4. VF arrest - Continue ASA, Plavix, statin and BB  5.SIRS - On abx per primary team   6. CKD IIIA - Scr stable     For questions or updates, please contact [place] Please consult [email or web address] for contact info under         Signed, Leanor Kail, PA  02/07/2022, 10:15 AM     I have seen and examined the patient along with Leanor Kail, PA .  I have reviewed the chart, notes and new data.  I agree with PA/NP's note.  Key new complaints: very anxious. No angina, breathing better Key examination changes: clear lungs. RRR, distinct aortic ejection murmur at RUSB and holosystolic murmur at apex Key new findings / data: creatinine improved 1.02  PLAN: For cardiac catheterization today, possible PCI. This procedure has been fully reviewed with the patient and her medical POA (nephew Delbert Phenix),  written informed consent has been obtained.   Sanda Klein, MD, [place] [phone] 02/07/2022, 10:59 AM

## 2022-02-07 NOTE — Progress Notes (Addendum)
Progress Note  Patient Name: Lindsey Pollard Date of Encounter: 02/07/2022  [facility] Cardiologist: Dr. Juliann Pares (Cardiology)  Subjective   Continues to have dyspnea. No chest pain.   Inpatient Medications    Scheduled Meds:  aspirin  81 mg Oral Daily   atorvastatin  80 mg Oral QPM   clopidogrel  75 mg Oral Daily   empagliflozin  10 mg Oral Daily   furosemide  40 mg Intravenous BID   metoprolol succinate  25 mg Oral Daily   sodium chloride flush  3 mL Intravenous Q12H   sodium chloride flush  3 mL Intravenous Q12H   Continuous Infusions:  sodium chloride     sodium chloride 10 mL/hr at 02/07/22 0643   ceFEPime (MAXIPIME) IV 2 g (02/07/22 1000)   heparin 700 Units/hr (02/07/22 0415)   nitroGLYCERIN 5 mcg/min (02/07/22 0444)   vancomycin     PRN Meds: sodium chloride, acetaminophen **OR** acetaminophen, albuterol, ALPRAZolam, sodium chloride flush   Vital Signs    Vitals:   02/07/22 0400 02/07/22 0403 02/07/22 0410 02/07/22 0555  BP: (S) (!) 92/35 (!) 113/52  (!) 110/49  Pulse: 75 76  73  Resp: 20 17  20   Temp: 98.7 F (37.1 C)   97.8 F (36.6 C)  TempSrc: Oral   Oral  SpO2: 100%  100% 100%  Weight:      Height:        Intake/Output Summary (Last 24 hours) at 02/07/2022 1015 Last data filed at 02/07/2022 0444 Gross per 24 hour  Intake 626.93 ml  Output 200 ml  Net 426.93 ml      02/07/2022   12:39 AM 02/06/2022    7:54 PM 02/03/2022    5:04 AM  Last 3 Weights  Weight (lbs) 125 lb 3.5 oz 122 lb 5.7 oz 124 lb 9 oz  Weight (kg) 56.8 kg 55.5 kg 56.5 kg      Telemetry    SR - Personally Reviewed  ECG    N/A  Physical Exam   GEN: No acute distress.   Neck: No JVD Cardiac: RRR, no murmurs, rubs, or gallops.  Respiratory: Clear to auscultation bilaterally. GI: Soft, nontender, non-distended  MS: No edema; No deformity. Neuro:  Nonfocal  Psych: Normal affect   Labs    High Sensitivity Troponin:   Recent Labs  Lab 01/29/22 2136  02/06/22 0404 02/06/22 0629 02/06/22 0933 02/06/22 1052  TROPONINIHS 293* 46* 145* 621* 833*     Chemistry Recent Labs  Lab 02/02/22 0411 02/06/22 0404 02/06/22 0457 02/06/22 0458 02/06/22 0948 02/07/22 0658  NA 133* 134* 131* 129* 131* 132*  K 3.9 3.7 3.7 3.4* 4.1 4.0  CL 94* 89* 87*  --   --  89*  CO2 31 32  --   --   --  34*  GLUCOSE 100* 272* 276*  --   --  150*  BUN 53* 26* 29*  --   --  35*  CREATININE 1.04* 1.09* 1.10*  --   --  1.02*  CALCIUM 8.0* 9.2  --   --   --  8.9  PROT  --  6.1*  --   --   --   --   ALBUMIN  --  3.5  --   --   --   --   AST  --  35  --   --   --   --   ALT  --  23  --   --   --   --  ALKPHOS  --  136*  --   --   --   --   BILITOT  --  1.1  --   --   --   --   GFRNONAA 54* 51*  --   --   --  56*  ANIONGAP 8 13  --   --   --  9    Lipids No results for input(s): "CHOL", "TRIG", "HDL", "LABVLDL", "LDLCALC", "CHOLHDL" in the last 168 hours.  Hematology Recent Labs  Lab 02/06/22 0404 02/06/22 0457 02/06/22 0458 02/06/22 0948 02/07/22 0658  WBC 16.3*  --   --   --  12.8*  RBC 3.22*  --   --   --  2.76*  HGB 9.6*   < > 9.5* 11.2* 8.2*  HCT 29.9*   < > 28.0* 33.0* 25.5*  MCV 92.9  --   --   --  92.4  MCH 29.8  --   --   --  29.7  MCHC 32.1  --   --   --  32.2  RDW 14.6  --   --   --  14.9  PLT 336  --   --   --  231   < > = values in this interval not displayed.   Thyroid No results for input(s): "TSH", "FREET4" in the last 168 hours.  BNP Recent Labs  Lab 02/06/22 0404  BNP 2,726.8*     Radiology    DG Chest Portable 1 View  Result Date: 02/06/2022 CLINICAL DATA:  81 year old female with shortness of breath. EXAM: PORTABLE CHEST 1 VIEW COMPARISON:  Portable chest 01/29/2022 and earlier. FINDINGS: Portable AP semi upright view at 0417 hours. Continued moderate to large bilateral veiling lower lung opacity. Obscured diaphragm as before. No air bronchograms or pneumothorax identified. Upper lung pulmonary vascularity appears  stable, mild congestion. Stable cardiac size and mediastinal contours. Previous sternotomy. Paucity of bowel gas in the upper abdomen. No acute osseous abnormality identified. IMPRESSION: 1. Continued moderate to large bilateral pleural effusions with bibasilar collapse or consolidation. 2. Stable pulmonary vascular congestion, probable mild interstitial edema. Electronically Signed   By: Odessa Fleming M.D.   On: 02/06/2022 05:29    Cardiac Studies   Pending cath  Patient Profile     81 y.o. female  with a hx of CAD s/p CABG in 2018, ICM , CKD-3, HLD, DM-2, AF arrest, GERD who is being seen 02/06/2022 for the evaluation of acute CHF wit ICM at the request of Dr. Katrinka Blazing.  Assessment & Plan    NSTEMI - Recent multiple admission for NSTEMI and CHF exacerbation along with VF arrest. Treated medically (see consult note for details). - now presented from Rehab facility for acute SOB requiring BiPAP, now off - Hs-troponin 621>>833. On heparin and nitro drip.  - Dr. Royann Shivers and Dr. Excell Seltzer has reviewed prior cath and felt "Progressive disease in RCA could explain her recent escalating clinical events and may be amenable to PCI. Unlikely to benefit from PCI to left main" - Plan for cath today.  - Discussed findings and cath with patient and POA "[name]" for about 20 minutes and both agreed to proceed.    The patient understands that risks include but are not limited to stroke (1 in 1000), death (1 in 1000), kidney failure [usually temporary] (1 in 500), bleeding (1 in 200), allergic reaction [possibly serious] (1 in 200), and agrees to proceed.     2. ICM - Last echo 01/12/22  with LVEF of 45-50% - Continue IV lasix, Jardiance and BB - Will update GDMT based on cath   3. CAD 4. VF arrest - Continue ASA, Plavix, statin and BB  5.SIRS - On abx per primary team   6. CKD IIIA - Scr stable     For questions or updates, please contact [place] Please consult [email or web address] for contact info under         Signed, Leanor Kail, PA  02/07/2022, 10:15 AM     I have seen and examined the patient along with Leanor Kail, PA .  I have reviewed the chart, notes and new data.  I agree with PA/NP's note.  Key new complaints: very anxious. No angina, breathing better Key examination changes: clear lungs. RRR, distinct aortic ejection murmur at RUSB and holosystolic murmur at apex Key new findings / data: creatinine improved 1.02  PLAN: For cardiac catheterization today, possible PCI. This procedure has been fully reviewed with the patient and her medical POA (nephew Lindsey Pollard),  written informed consent has been obtained.   Sanda Klein, MD, [place] [phone] 02/07/2022, 10:59 AM

## 2022-02-07 NOTE — Progress Notes (Signed)
SITE AREA: right groin/femoral  SITE PRIOR TO REMOVAL:  LEVEL 0  PRESSURE APPLIED FOR: approximately 30 minutes  MANUAL: yes  PATIENT STATUS DURING PULL: stable  POST PULL SITE:  LEVEL 0  POST PULL INSTRUCTIONS GIVEN: yes  POST PULL PULSES PRESENT: bilateral pedal pulses at +1  DRESSING APPLIED: gauze with tegaderm  BEDREST BEGINS @ 2210   COMMENTS: sheath removed by Millicent, RCIS and Beverly Milch, RN

## 2022-02-07 NOTE — Interval H&P Note (Signed)
History and Physical Interval Note:  02/07/2022 1:41 PM  [name]  has presented today for surgery, with the diagnosis of nstemi.  The various methods of treatment have been discussed with the patient and family. After consideration of risks, benefits and other options for treatment, the patient has consented to  Procedure(s): LEFT HEART CATH AND CORONARY ANGIOGRAPHY (N/A) as a surgical intervention.  The patient's history has been reviewed, patient examined, no change in status, stable for surgery.  I have reviewed the patient's chart and labs.  Questions were answered to the patient's satisfaction.    Cath Lab Visit (complete for each Cath Lab visit)  Clinical Evaluation Leading to the Procedure:   ACS: Yes.    Non-ACS:    Anginal Classification: CCS IV  Anti-ischemic medical therapy: Maximal Therapy (2 or more classes of medications)  Non-Invasive Test Results: No non-invasive testing performed  Prior CABG: No previous CABG        Orbie Pyo

## 2022-02-08 ENCOUNTER — Encounter (HOSPITAL_COMMUNITY): Payer: Self-pay | Admitting: Internal Medicine

## 2022-02-08 ENCOUNTER — Other Ambulatory Visit: Payer: Self-pay

## 2022-02-08 DIAGNOSIS — I5043 Acute on chronic combined systolic (congestive) and diastolic (congestive) heart failure: Secondary | ICD-10-CM | POA: Diagnosis not present

## 2022-02-08 LAB — BASIC METABOLIC PANEL
Anion gap: 10 (ref 5–15)
BUN: 34 mg/dL — ABNORMAL HIGH (ref 8–23)
CO2: 32 mmol/L (ref 22–32)
Calcium: 8.6 mg/dL — ABNORMAL LOW (ref 8.9–10.3)
Chloride: 91 mmol/L — ABNORMAL LOW (ref 98–111)
Creatinine, Ser: 1.09 mg/dL — ABNORMAL HIGH (ref 0.44–1.00)
GFR, Estimated: 51 mL/min — ABNORMAL LOW (ref >60)
Glucose, Bld: 120 mg/dL — ABNORMAL HIGH (ref 70–99)
Potassium: 3.7 mmol/L (ref 3.5–5.1)
Sodium: 133 mmol/L — ABNORMAL LOW (ref 135–145)

## 2022-02-08 LAB — VITAMIN B12: Vitamin B-12: 380 pg/mL (ref 180–914)

## 2022-02-08 LAB — CBC
HCT: 24.7 % — ABNORMAL LOW (ref 36.0–46.0)
Hemoglobin: 7.8 g/dL — ABNORMAL LOW (ref 12.0–15.0)
MCH: 29.5 pg (ref 26.0–34.0)
MCHC: 31.6 g/dL (ref 30.0–36.0)
MCV: 93.6 fL (ref 80.0–100.0)
Platelets: 224 10*3/uL (ref 150–400)
RBC: 2.64 MIL/uL — ABNORMAL LOW (ref 3.87–5.11)
RDW: 15.4 % (ref 11.5–15.5)
WBC: 10.2 10*3/uL (ref 4.0–10.5)
nRBC: 0 % (ref 0.0–0.2)

## 2022-02-08 LAB — IRON AND TIBC
Iron: 23 ug/dL — ABNORMAL LOW (ref 28–170)
Saturation Ratios: 6 % — ABNORMAL LOW (ref 10.4–31.8)
TIBC: 403 ug/dL (ref 250–450)
UIBC: 380 ug/dL

## 2022-02-08 LAB — TROPONIN I (HIGH SENSITIVITY)
Troponin I (High Sensitivity): 462 ng/L (ref <18)
Troponin I (High Sensitivity): 421 ng/L (ref <18)

## 2022-02-08 LAB — GLUCOSE, CAPILLARY: Glucose-Capillary: 196 mg/dL — ABNORMAL HIGH (ref 70–99)

## 2022-02-08 LAB — RETICULOCYTES
Immature Retic Fract: 21 % — ABNORMAL HIGH (ref 2.3–15.9)
RBC.: 2.62 MIL/uL — ABNORMAL LOW (ref 3.87–5.11)
Retic Count, Absolute: 63.4 10*3/uL (ref 19.0–186.0)
Retic Ct Pct: 2.4 % (ref 0.4–3.1)

## 2022-02-08 LAB — FOLATE: Folate: 16.4 ng/mL (ref >5.9)

## 2022-02-08 LAB — HEMOGLOBIN: Hemoglobin: 7.9 g/dL — ABNORMAL LOW (ref 12.0–15.0)

## 2022-02-08 LAB — FERRITIN: Ferritin: 40 ng/mL (ref 11–307)

## 2022-02-08 MED ORDER — ENOXAPARIN SODIUM 40 MG/0.4ML IJ SOSY
40.0000 mg | PREFILLED_SYRINGE | INTRAMUSCULAR | Status: DC
  Administered 2022-02-08 – 2022-02-09 (×2): 40 mg via SUBCUTANEOUS
  Filled 2022-02-08 (×2): qty 0.4

## 2022-02-08 MED ORDER — SODIUM CHLORIDE 0.9 % IV SOLN
250.0000 mg | Freq: Every day | INTRAVENOUS | Status: AC
  Administered 2022-02-08 – 2022-02-09 (×2): 250 mg via INTRAVENOUS
  Filled 2022-02-08 (×2): qty 20

## 2022-02-08 MED ORDER — NITROGLYCERIN 0.4 MG SL SUBL
0.4000 mg | SUBLINGUAL_TABLET | SUBLINGUAL | Status: DC | PRN
Start: 2022-02-08 — End: 2022-02-16

## 2022-02-08 MED ORDER — NITROGLYCERIN 0.4 MG SL SUBL
SUBLINGUAL_TABLET | SUBLINGUAL | Status: AC
  Administered 2022-02-08: 0.4 mg via SUBLINGUAL
  Filled 2022-02-08: qty 1

## 2022-02-08 NOTE — Evaluation (Signed)
Physical Therapy Evaluation Patient Details Name: Lindsey Pollard MRN: [id number] DOB: 23-Feb-1941 Today's Date: 02/08/2022  History of Present Illness  81 y.o. female presents to [facility] hospital on 02/06/2022 with progressive SOB. Pt recently admitted 8/7-8/12 with acute on chronic respiratory failure with hypoxia secondary to acute on chronic systolic heart failure, discharged to SNF. Pt again admitted for CHF with hypoxia and hypercapnia. L heart cath 8/16. PMH significant of recent Vfib arrest; chronic systolic CHF; CAD s/p CABG and PCI; stage 3 CKD; and DM.  Clinical Impression  Pt presents to PT with deficits in functional mobility, gait, balance, endurance. Pt currently benefits from physical assistance to mobilize in bed, as well as a hand hold or UE support to maintain stability when transferring. Pt fatigues quickly at this time and is unable to progress to ambulation. Pt will benefit from aggressive mobilization in an effort to improve endurance and reduce falls risk. PT recommends return to SNF for further PT services as the pt has no caregiver assistance currently available at home.       Recommendations for follow up therapy are one component of a multi-disciplinary discharge planning process, led by the attending physician.  Recommendations may be updated based on patient status, additional functional criteria and insurance authorization.  Follow Up Recommendations Skilled nursing-short term rehab (<3 hours/day) Can patient physically be transported by private vehicle: Yes    Assistance Recommended at Discharge Frequent or constant Supervision/Assistance  Patient can return home with the following  A lot of help with walking and/or transfers;A lot of help with bathing/dressing/bathroom;Assistance with cooking/housework;Assist for transportation;Help with stairs or ramp for entrance    Equipment Recommendations Rolling walker (2 wheels);BSC/3in1  Recommendations for Other Services        Functional Status Assessment Patient has had a recent decline in their functional status and demonstrates the ability to make significant improvements in function in a reasonable and predictable amount of time.     Precautions / Restrictions Precautions Precautions: Fall Precaution Comments: monitor vitals closely Restrictions Weight Bearing Restrictions: No      Mobility  Bed Mobility Overal bed mobility: Needs Assistance Bed Mobility: Supine to Sit, Sit to Supine     Supine to sit: Min assist, HOB elevated Sit to supine: Min assist, HOB elevated   General bed mobility comments: use of bed rails and PT hand hold to elevate trunk into upright position    Transfers Overall transfer level: Needs assistance Equipment used: 1 person hand held assist Transfers: Sit to/from Stand, Bed to chair/wheelchair/BSC Sit to Stand: Min assist   Step pivot transfers: Min guard       General transfer comment: pt transfers to bedside commode and back to bed    Ambulation/Gait Ambulation/Gait assistance:  (deferred due to fatigue)                Stairs            Wheelchair Mobility    Modified Rankin (Stroke Patients Only)       Balance Overall balance assessment: Needs assistance Sitting-balance support: No upper extremity supported, Feet supported Sitting balance-Leahy Scale: Fair     Standing balance support: Single extremity supported Standing balance-Leahy Scale: Poor Standing balance comment: reliant on hand hold                             Pertinent Vitals/Pain Pain Assessment Pain Assessment: 0-10 Pain Score: 2  Pain Location: chest  Pain Descriptors / Indicators: Discomfort Pain Intervention(s): Monitored during session    Home Living Family/patient expects to be discharged to:: Skilled nursing facility Living Arrangements: Alone (friend that had been living with her is now at ALF) Available Help at Discharge:  (none  identified) Type of Home: House Home Access: Level entry       Home Layout: One level Home Equipment: Cane - single point;Grab bars - toilet;Shower seat      Prior Function Prior Level of Function : Independent/Modified Independent             Mobility Comments: pt independent at baseline, reports she had been working on transfer training at [facility] but had not progressed to gait training       Hand Dominance   Dominant Hand: Right    Extremity/Trunk Assessment   Upper Extremity Assessment Upper Extremity Assessment: Generalized weakness    Lower Extremity Assessment Lower Extremity Assessment: Generalized weakness    Cervical / Trunk Assessment Cervical / Trunk Assessment: Kyphotic  Communication   Communication: HOH  Cognition Arousal/Alertness: Awake/alert Behavior During Therapy: Flat affect Overall Cognitive Status: Within Functional Limits for tasks assessed                                          General Comments General comments (skin integrity, edema, etc.): VSS on 2L [place]    Exercises     Assessment/Plan    PT Assessment Patient needs continued PT services  PT Problem List Decreased strength;Decreased activity tolerance;Decreased balance;Decreased mobility;Decreased knowledge of use of DME;Cardiopulmonary status limiting activity       PT Treatment Interventions DME instruction;Functional mobility training;Gait training;Therapeutic activities;Therapeutic exercise;Balance training;Neuromuscular re-education;Patient/family education    PT Goals (Current goals can be found in the Care Plan section)  Acute Rehab PT Goals Patient Stated Goal: to improve endurance and go to [place] to live with her daughter PT Goal Formulation: With patient Time For Goal Achievement: 02/22/22 Potential to Achieve Goals: Good    Frequency Min 2X/week     Co-evaluation               AM-PAC PT "6 Clicks" Mobility  Outcome Measure Help  needed turning from your back to your side while in a flat bed without using bedrails?: A Little Help needed moving from lying on your back to sitting on the side of a flat bed without using bedrails?: A Little Help needed moving to and from a bed to a chair (including a wheelchair)?: A Little Help needed standing up from a chair using your arms (e.g., wheelchair or bedside chair)?: A Little Help needed to walk in hospital room?: Total Help needed climbing 3-5 steps with a railing? : Total 6 Click Score: 14    End of Session Equipment Utilized During Treatment: Oxygen Activity Tolerance: Patient limited by fatigue Patient left: in bed;with call bell/phone within reach;with bed alarm set Nurse Communication: Mobility status PT Visit Diagnosis: Other abnormalities of gait and mobility (R26.89);Muscle weakness (generalized) (M62.81)    Time: 1610-9604 PT Time Calculation (min) (ACUTE ONLY): 25 min   Charges:   PT Evaluation $PT Eval Low Complexity: 1 Low          Arlyss Gandy, PT, DPT Acute Rehabilitation Office [phone]   Arlyss Gandy 02/08/2022, 10:59 AM

## 2022-02-08 NOTE — TOC Initial Note (Signed)
Transition of Care [facility]) - Initial/Assessment Note    Patient Details  Name: Lindsey Pollard MRN: [id number] Date of Birth: 06/14/1941  [facility]) CM/SW Contact:    Delilah Shan, LCSWA Phone Number: 02/08/2022, 2:35 PM  Clinical Narrative:                  CSW received consult for possible SNF placement at time of discharge. CSW spoke with patient at bedside regarding PT recommendation of SNF placement at time of discharge.Prior to hospitalization patient reports she comes from [facility] short term. She lives at home with friend.Patient expressed understanding of PT recommendation and is agreeable to SNF placement at time of discharge. Patient reports she would like to return to [facility] for rehab.CSW discussed insurance authorization process with patient.Patient reports she has received the COVID vaccines as well as 1 booster.  No further questions reported at this time. CSW to continue to follow and assist with discharge planning needs.   Expected Discharge Plan: Skilled Nursing Facility Barriers to Discharge: Continued Medical Work up   Patient Goals and CMS Choice Patient states their goals for this hospitalization and ongoing recovery are:: SNF CMS Medicare.gov Compare Post Acute Care list provided to:: Patient Choice offered to / list presented to : Patient  Expected Discharge Plan and Services Expected Discharge Plan: Skilled Nursing Facility In-house Referral: Clinical Social Work Discharge Planning Services: CM Consult   Living arrangements for the past 2 months:  (recently at [facility] short term lives at home with friend)                   DME Agency: NA       HH Arranged: NA          Prior Living Arrangements/Services Living arrangements for the past 2 months:  (recently at [facility] short term lives at home with friend) Lives with:: Friends (recently at [facility] place short term usually lives at home with friend) Patient language and  need for interpreter reviewed:: Yes Do you feel safe going back to the place where you live?: No   SNF  Need for Family Participation in Patient Care: Yes (Comment) Care giver support system in place?: Yes (comment) Current home services:  (rollator) Criminal Activity/Legal Involvement Pertinent to Current Situation/Hospitalization: No - Comment as needed  Activities of Daily Living Home Assistive Devices/Equipment: Hearing aid, Walker (specify type) ADL Screening (condition at time of admission) Patient's cognitive ability adequate to safely complete daily activities?: Yes Is the patient deaf or have difficulty hearing?: Yes Does the patient have difficulty seeing, even when wearing glasses/contacts?: Yes Does the patient have difficulty concentrating, remembering, or making decisions?: Yes Patient able to express need for assistance with ADLs?: Yes Does the patient have difficulty dressing or bathing?: Yes Independently performs ADLs?: Yes (appropriate for developmental age) Does the patient have difficulty walking or climbing stairs?: Yes Weakness of Legs: Both Weakness of Arms/Hands: Both  Permission Sought/Granted Permission sought to share information with : Case Manager, Family Supports, Oceanographer granted to share information with : Yes, Verbal Permission Granted  Share Information with NAME: Riley Lam  Permission granted to share info w AGENCY: SNF  Permission granted to share info w Relationship: nephew  Permission granted to share info w Contact Information: Riley Lam [phone]  Emotional Assessment Appearance:: Appears stated age Attitude/Demeanor/Rapport: Gracious Affect (typically observed): Calm Orientation: : Oriented to Self, Oriented to Place, Oriented to  Time, Oriented to Situation Alcohol /  Substance Use: Not Applicable Psych Involvement: No (comment)  Admission diagnosis:  Acute on chronic systolic congestive heart failure  (HCC) [I50.23] Acute on chronic combined systolic (congestive) and diastolic (congestive) heart failure (HCC) [I50.43] Acute on chronic respiratory failure with hypoxia (HCC) [J96.21] Patient Active Problem List   Diagnosis Date Noted   Acute on chronic combined systolic (congestive) and diastolic (congestive) heart failure (HCC) 02/06/2022   SIRS (systemic inflammatory response syndrome) (HCC) 02/06/2022   Elevated troponin 02/06/2022   Hyponatremia 02/06/2022   Anemia due to chronic kidney disease 02/06/2022   Pressure injury of skin 02/01/2022   Acute on chronic systolic (congestive) heart failure (HCC) 01/29/2022   Fever and chills 01/17/2022   Malnutrition of moderate degree 01/11/2022   Overweight (BMI 25.0-29.9) 01/06/2022   Unstable angina (HCC) 12/07/2021   NSTEMI (non-ST elevated myocardial infarction) (HCC) 12/06/2021   Chronic kidney disease, stage 3a (HCC) 12/06/2021   Lung nodule 12/06/2021   Controlled type 2 diabetes mellitus without complication, without long-term current use of insulin (HCC) 12/06/2021   HTN (hypertension) 12/06/2021   HLD (hyperlipidemia) 12/06/2021   AKI (acute kidney injury) (HCC) in the setting of stage IIIa chronic    Anemia    Acute on chronic respiratory failure with hypoxia (HCC)    Chronic diastolic CHF (congestive heart failure) (HCC)    Fungal infection of the groin    Fungal infection of skin    Essential hypertension 06/01/2021   Carotid stenosis 10/26/2020   Atherosclerosis of native arteries of extremity with intermittent claudication (HCC) 09/16/2020   Carotid bruit present 09/16/2020   Healthcare maintenance 01/06/2019   Aortic calcification (HCC) 06/19/2017   Ischemic cardiomyopathy 06/19/2017   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/17/2017   PVD (peripheral vascular disease) (HCC) 06/14/2017   Pseudophakia of right eye 11/10/2014   Bilateral hearing loss 10/05/2014   CAD (coronary artery disease) 10/05/2014   GERD (gastroesophageal reflux  disease) 10/05/2014   RLS (restless legs syndrome) 09/09/2014   Type 2 diabetes mellitus with hyperlipidemia (HCC) 08/27/2014   PCP:  Lauro Regulus, MD Pharmacy:   [facility] [address] [place] [address] Phone: [phone] Fax: [phone]  [facility] - [place], [place] - [address] [place] [address] [place] [address] [place] [address] Phone: [phone] Fax: [phone]     Social Determinants of Health (SDOH) Interventions Food Insecurity Interventions: Intervention Not Indicated Financial Strain Interventions: Intervention Not Indicated Housing Interventions: Intervention Not Indicated Transportation Interventions: Intervention Not Indicated  Readmission Risk Interventions    02/07/2022   11:31 AM 01/18/2022    9:04 PM 01/05/2022    5:09 PM  Readmission Risk Prevention Plan  Transportation Screening Complete Complete Complete  PCP or Specialist Appt within 3-5 Days   Complete  Social Work Consult for Recovery Care Planning/Counseling   Not Complete  SW consult not completed comments   NA  Palliative Care Screening   Not Applicable  Medication Review Oceanographer) Complete Referral to Pharmacy Referral to Pharmacy  PCP or Specialist appointment within 3-5 days of discharge  Complete   HRI or Home Care Consult Complete Complete   SW Recovery Care/Counseling Consult Complete Not Complete   SW Consult Not Complete Comments  NA   Palliative Care Screening Not Applicable Not Applicable   Skilled Nursing Facility Complete Complete

## 2022-02-08 NOTE — Progress Notes (Signed)
Patient c/o chest pain, rating 5/10 and describing as pressure to center of chest, non-radiating, no SOB.  EKG done.  Dr. Jomarie Longs here, orders written.  Cardiology PA [facility] notified.  Patient given SL NTGx 2 with resolution of pain.

## 2022-02-08 NOTE — Progress Notes (Signed)
CARDIAC REHAB PHASE I      Pt having chest pain this morning. RN and Md aware and actively treating. Pt chest pain is better now per pt.  Introduced myself and our cardiac rehab phase 1 program. Pt was admitted from rehab facility pt states she was unable to ambulate there, just bed to [place] transfers. Physical therapy will be working with pt as her condition allows. Will continue to follow.   [id number]  Woodroe Chen, RN BSN 02/08/2022 8:44 AM

## 2022-02-08 NOTE — NC FL2 (Signed)
[place] MEDICAID FL2 LEVEL OF CARE SCREENING TOOL     IDENTIFICATION  Patient Name: Lindsey Pollard Birthdate: 22-Nov-1940 Sex: female Admission Date (Current Location): 02/06/2022  [facility] and [place] Number:  Producer, television/film/video and Address:  [facility], [address], [place], [place] [address]      Provider Number: [id number]  Attending Physician Name and Address:  Zannie Cove, MD  Relative Name and Phone Number:  Riley Lam [facility]) [phone]    Current Level of Care: Hospital Recommended Level of Care: Skilled Nursing Facility Prior Approval Number:    Date Approved/Denied:   PASRR Number: [id number]  Discharge Plan: SNF    Current Diagnoses: Patient Active Problem List   Diagnosis Date Noted   Acute on chronic combined systolic (congestive) and diastolic (congestive) heart failure (HCC) 02/06/2022   SIRS (systemic inflammatory response syndrome) (HCC) 02/06/2022   Elevated troponin 02/06/2022   Hyponatremia 02/06/2022   Anemia due to chronic kidney disease 02/06/2022   Pressure injury of skin 02/01/2022   Acute on chronic systolic (congestive) heart failure (HCC) 01/29/2022   Fever and chills 01/17/2022   Malnutrition of moderate degree 01/11/2022   Overweight (BMI 25.0-29.9) 01/06/2022   Unstable angina (HCC) 12/07/2021   NSTEMI (non-ST elevated myocardial infarction) (HCC) 12/06/2021   Chronic kidney disease, stage 3a (HCC) 12/06/2021   Lung nodule 12/06/2021   Controlled type 2 diabetes mellitus without complication, without long-term current use of insulin (HCC) 12/06/2021   HTN (hypertension) 12/06/2021   HLD (hyperlipidemia) 12/06/2021   AKI (acute kidney injury) (HCC) in the setting of stage IIIa chronic    Anemia    Acute on chronic respiratory failure with hypoxia (HCC)    Chronic diastolic CHF (congestive heart failure) (HCC)    Fungal infection of the groin    Fungal infection of skin    Essential hypertension  06/01/2021   Carotid stenosis 10/26/2020   Atherosclerosis of native arteries of extremity with intermittent claudication (HCC) 09/16/2020   Carotid bruit present 09/16/2020   Healthcare maintenance 01/06/2019   Aortic calcification (HCC) 06/19/2017   Ischemic cardiomyopathy 06/19/2017   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/17/2017   PVD (peripheral vascular disease) (HCC) 06/14/2017   Pseudophakia of right eye 11/10/2014   Bilateral hearing loss 10/05/2014   CAD (coronary artery disease) 10/05/2014   GERD (gastroesophageal reflux disease) 10/05/2014   RLS (restless legs syndrome) 09/09/2014   Type 2 diabetes mellitus with hyperlipidemia (HCC) 08/27/2014    Orientation RESPIRATION BLADDER Height & Weight     Self, Time, Situation, Place  O2 (Nasal Cannula 2 liters) Incontinent, External catheter (External Urinary Catheter) Weight: 125 lb 3.5 oz (56.8 kg) Height:  4\' 11"  (149.9 cm)  BEHAVIORAL SYMPTOMS/MOOD NEUROLOGICAL BOWEL NUTRITION STATUS      Continent Diet (Please see discharge summary)  AMBULATORY STATUS COMMUNICATION OF NEEDS Skin   Extensive Assist Verbally Other (Comment) (Appropriate for ethnicity,dry,ecchymosis,arm,bilateral,erythema,bilateral,buttocks,non-tenting,wound incision LDAs,PI buttocks medial stage 1,foam lift dressing,clean dry,intact,PRN,epithelialized,pink,red,erythema non-blanchable,PI sacrum,mid,L, stage 2)                       Personal Care Assistance Level of Assistance  Bathing, Feeding, Dressing Bathing Assistance: Maximum assistance Feeding assistance: Limited assistance (Needs set up) Dressing Assistance: Maximum assistance     Functional Limitations Info  Sight, Hearing, Speech Sight Info: Impaired Hearing Info: Impaired Speech Info: Adequate    SPECIAL CARE FACTORS FREQUENCY  PT (By licensed PT), OT (By licensed OT)  PT Frequency: 5x min weekly OT Frequency: 5x min weekly            Contractures Contractures Info: Not present    Additional  Factors Info  Code Status, Allergies Code Status Info: FULL Allergies Info: Tetanus Toxoids,Penicillin G           Current Medications (02/08/2022):  This is the current hospital active medication list Current Facility-Administered Medications  Medication Dose Route Frequency Provider Last Rate Last Admin   0.9 %  sodium chloride infusion  250 mL Intravenous PRN Orbie Pyo, MD       acetaminophen (TYLENOL) tablet 650 mg  650 mg Oral Q6H PRN Clydie Braun, MD       Or   acetaminophen (TYLENOL) suppository 650 mg  650 mg Rectal Q6H PRN Madelyn Flavors A, MD       acetaminophen (TYLENOL) tablet 650 mg  650 mg Oral Q4H PRN Orbie Pyo, MD   650 mg at 02/07/22 2243   albuterol (PROVENTIL) (2.5 MG/3ML) 0.083% nebulizer solution 2.5 mg  2.5 mg Nebulization Q2H PRN Clydie Braun, MD       ALPRAZolam Prudy Feeler) tablet 0.125 mg  0.125 mg Oral TID PRN Madelyn Flavors A, MD   0.125 mg at 02/08/22 1112   aspirin chewable tablet 81 mg  81 mg Oral Daily Orbie Pyo, MD   81 mg at 02/08/22 6834   atorvastatin (LIPITOR) tablet 80 mg  80 mg Oral QPM Smith, Rondell A, MD   80 mg at 02/06/22 2229   clopidogrel (PLAVIX) tablet 75 mg  75 mg Oral Q breakfast Orbie Pyo, MD   75 mg at 02/08/22 1962   empagliflozin (JARDIANCE) tablet 10 mg  10 mg Oral Daily Madelyn Flavors A, MD   10 mg at 02/08/22 0822   enoxaparin (LOVENOX) injection 40 mg  40 mg Subcutaneous Q24H Zannie Cove, MD       ferric gluconate (FERRLECIT) 250 mg in sodium chloride 0.9 % 250 mL IVPB  250 mg Intravenous Daily Zannie Cove, MD 135 mL/hr at 02/08/22 1138 250 mg at 02/08/22 1138   furosemide (LASIX) injection 40 mg  40 mg Intravenous BID Madelyn Flavors A, MD   40 mg at 02/08/22 0823   metoprolol succinate (TOPROL-XL) 24 hr tablet 25 mg  25 mg Oral Daily Smith, Rondell A, MD   25 mg at 02/08/22 2297   nitroGLYCERIN (NITROSTAT) SL tablet 0.4 mg  0.4 mg Sublingual Q5 min PRN Zannie Cove, MD   0.4 mg at 02/08/22  0806   ondansetron (ZOFRAN) injection 4 mg  4 mg Intravenous Q6H PRN Orbie Pyo, MD       sodium chloride flush (NS) 0.9 % injection 3 mL  3 mL Intravenous Q12H Smith, Rondell A, MD   3 mL at 02/07/22 0844   sodium chloride flush (NS) 0.9 % injection 3 mL  3 mL Intravenous Q12H Nada Boozer R, NP   3 mL at 02/08/22 0826   sodium chloride flush (NS) 0.9 % injection 3 mL  3 mL Intravenous Q12H Orbie Pyo, MD   3 mL at 02/08/22 0824   sodium chloride flush (NS) 0.9 % injection 3 mL  3 mL Intravenous PRN Orbie Pyo, MD         Discharge Medications: Please see discharge summary for a list of discharge medications.  Relevant Imaging Results:  Relevant Lab Results:   Additional Information [id number], Both Covid Vaccines 1 booster  Milas Gain, LCSWA

## 2022-02-08 NOTE — Progress Notes (Addendum)
Progress Note  Patient Name: Lindsey Pollard Date of Encounter: 02/08/2022  [facility] Cardiologist: [place]  Subjective   Pt had an episode of chest pain this morning treated with nitro x 2, CP now resolved. EKG appears stable  Inpatient Medications    Scheduled Meds:  aspirin  81 mg Oral Daily   atorvastatin  80 mg Oral QPM   clopidogrel  75 mg Oral Q breakfast   empagliflozin  10 mg Oral Daily   furosemide  40 mg Intravenous BID   metoprolol succinate  25 mg Oral Daily   sodium chloride flush  3 mL Intravenous Q12H   sodium chloride flush  3 mL Intravenous Q12H   sodium chloride flush  3 mL Intravenous Q12H   Continuous Infusions:  sodium chloride     PRN Meds: sodium chloride, acetaminophen **OR** acetaminophen, acetaminophen, albuterol, ALPRAZolam, ondansetron (ZOFRAN) IV, sodium chloride flush   Vital Signs    Vitals:   02/07/22 2234 02/07/22 2305 02/08/22 0049 02/08/22 0353  BP: (!) 144/50  (!) 127/91 (!) 125/42  Pulse: 71 77 62 72  Resp: 19 18 18 19   Temp: 98.1 F (36.7 C)   98.2 F (36.8 C)  TempSrc: Oral   Oral  SpO2: 91% 95% 99% 99%  Weight:      Height:        Intake/Output Summary (Last 24 hours) at 02/08/2022 0726 Last data filed at 02/07/2022 2300 Gross per 24 hour  Intake 881.67 ml  Output 740 ml  Net 141.67 ml      02/07/2022   12:39 AM 02/06/2022    7:54 PM 02/03/2022    5:04 AM  Last 3 Weights  Weight (lbs) 125 lb 3.5 oz 122 lb 5.7 oz 124 lb 9 oz  Weight (kg) 56.8 kg 55.5 kg 56.5 kg      Telemetry    Sinus rhythm HR 90s, PVCs - Personally Reviewed  ECG    Sinus rhythm HR 82, iRBBB (old), septal Q waves  - Personally Reviewed  Physical Exam   GEN: No acute distress.   Neck: No JVD Cardiac: RRR, no murmurs, rubs, or gallops.  Respiratory: Clear to auscultation bilaterally. GI: Soft, nontender, non-distended  MS: No edema; No deformity. Neuro:  Nonfocal  Psych: Normal affect  Right groin without hematoma  Labs     High Sensitivity Troponin:   Recent Labs  Lab 01/29/22 2136 02/06/22 0404 02/06/22 0629 02/06/22 0933 02/06/22 1052  TROPONINIHS 293* 46* 145* 621* 833*     Chemistry Recent Labs  Lab 02/06/22 0404 02/06/22 0457 02/06/22 0458 02/06/22 0948 02/07/22 0658 02/08/22 0321  NA 134* 131*   < > 131* 132* 133*  K 3.7 3.7   < > 4.1 4.0 3.7  CL 89* 87*  --   --  89* 91*  CO2 32  --   --   --  34* 32  GLUCOSE 272* 276*  --   --  150* 120*  BUN 26* 29*  --   --  35* 34*  CREATININE 1.09* 1.10*  --   --  1.02* 1.09*  CALCIUM 9.2  --   --   --  8.9 8.6*  PROT 6.1*  --   --   --   --   --   ALBUMIN 3.5  --   --   --   --   --   AST 35  --   --   --   --   --  ALT 23  --   --   --   --   --   ALKPHOS 136*  --   --   --   --   --   BILITOT 1.1  --   --   --   --   --   GFRNONAA 51*  --   --   --  56* 51*  ANIONGAP 13  --   --   --  9 10   < > = values in this interval not displayed.    Lipids No results for input(s): "CHOL", "TRIG", "HDL", "LABVLDL", "LDLCALC", "CHOLHDL" in the last 168 hours.  Hematology Recent Labs  Lab 02/06/22 0404 02/06/22 0457 02/06/22 0948 02/07/22 0658 02/08/22 0321  WBC 16.3*  --   --  12.8* 10.2  RBC 3.22*  --   --  2.76* 2.64*  2.62*  HGB 9.6*   < > 11.2* 8.2* 7.8*  HCT 29.9*   < > 33.0* 25.5* 24.7*  MCV 92.9  --   --  92.4 93.6  MCH 29.8  --   --  29.7 29.5  MCHC 32.1  --   --  32.2 31.6  RDW 14.6  --   --  14.9 15.4  PLT 336  --   --  231 224   < > = values in this interval not displayed.   Thyroid No results for input(s): "TSH", "FREET4" in the last 168 hours.  BNP Recent Labs  Lab 02/06/22 0404  BNP 2,726.8*    DDimer No results for input(s): "DDIMER" in the last 168 hours.   Radiology    CARDIAC CATHETERIZATION  Result Date: 02/07/2022   Annabell Sabal to [facility] LAD lesion is 95% stenosed.   Prox LAD to Mid LAD lesion is 100% stenosed.   Mid LAD lesion is 100% stenosed.   Mid RCA lesion is 50% stenosed.   Ost RCA lesion is 80% stenosed.    Origin lesion is 100% stenosed.   Prox Cx lesion is 80% stenosed.   Post intervention, there is a 0% residual stenosis. 1.  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 2.  Underexpanded ostial right coronary artery stent due to surrounding calcium with a pre-Shockwave lithotripsy area of 3.72 mm and a post shockwave area of 6.56 mm; no additional stents were implanted. 3.  LVEDP of 20 mmHg. Commendation: Dual antiplatelet therapy for at least 1 year.    Cardiac Studies   Left heart cath 02/07/22: Conclusion    Ost LM to Ost LAD lesion is 95% stenosed.   Prox LAD to Mid LAD lesion is 100% stenosed.   Mid LAD lesion is 100% stenosed.   Mid RCA lesion is 50% stenosed.   Ost RCA lesion is 80% stenosed.   Origin lesion is 100% stenosed.   Prox Cx lesion is 80% stenosed.   Post intervention, there is a 0% residual stenosis.   1.  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 2.  Underexpanded ostial right coronary artery stent due to surrounding calcium with a pre-Shockwave lithotripsy area of 3.72 mm and a post shockwave area of 6.56 mm; no additional stents were implanted. 3.  LVEDP of 20 mmHg.   Commendation: Dual antiplatelet therapy for at least 1 year.     Diagnostic Dominance: Right  Intervention     Patient Profile     81 y.o. female with a  hx of CAD s/p CABG in 2018, ICM , CKD-3, HLD, DM-2, AF arrest, GERD who is being seen 02/06/2022 for the evaluation of acute CHF with ICM at the request of Dr. Tamala Julian.  Assessment & Plan    CAD Hx of CABG RIMA-RCA, LIMA-LAD, hx of Vfib arrest Heart cath yesterday with significant disease in the left system, patent LIMA-LAD; ISR in the ostial RCA treated with shockwave lithotripsy, no additional stents - pt placed on ASA + plavix x 12 months - continue BB, jardiance, lipitor - consider adding imdur if pressure allows   Anemia Hb drifted down to 7.8 from  11.2 2 days ago Pt has a history of anemia - groin without hematoma - pt denies bleeding, dizziness, near-syncope - consider rechecking H/H at lunch   Ischemic cardiomyopathy Echo 01/12/22 with LVEF 45-50% (was 55-60% 12/07/21) - pt was able to sleep flat last night with CPAP in place - does not appear volume up   Reduced mobility Cardiac rehab consulted, however pt did not ambulate at facility prior to presentation. PT has been consulted.   SIRS CKD stage IIIa - per primary team   She will follow up with Dr. Clayborn Bigness.      For questions or updates, please contact [place] Please consult [email or web address] for contact info under        Signed, Ledora Bottcher, PA  02/08/2022, 7:26 AM    I have seen and examined the patient along with Ledora Bottcher, PA .  I have reviewed the chart, notes and new data.  I agree with PA/NP's note.  Key new complaints: Currently resting comfortably, no chest pain. Key examination changes: Lying fully supine without respiratory difficulty.  Looks pale.  No hematoma at right groin access site. Key new findings / data: Hemoglobin has fallen, although not as dramatically as labs would suggest (the hemoglobin of 11.2, 9.5, 10.9 readings were obtained on an i-STAT sample and is likely spurious.  Over the last couple of weeks her hemoglobin has been mostly in the 8-9 range, although she did have a admission hemoglobin of 9.6 on 02/06/2022).    PLAN: Recheck hemoglobin in a couple of hours, if is falling further she would benefit from a blood transfusion, since she is having angina pectoris. Plan aspirin plus clopidogrel for a minimum of 30 days (she had angioplasty without placement of a new stent), preferably for 12 months, unless she has serious bleeding complications.  Sanda Klein, MD, [place] [phone] 02/08/2022, 10:31 AM

## 2022-02-08 NOTE — Progress Notes (Signed)
PROGRESS NOTE    Lindsey Pollard  [id number] DOB: 31-Oct-1940 DOA: 02/06/2022 PCP: [name], MD  81/F with history of CAD, CABG, ischemic cardiomyopathy, CKD 3 AAA, type 2 diabetes mellitus, recent hospitalizations with NSTEMI, V-fib arrest, ventilator dependent respiratory failure followed by frequent admissions with CHF Back in the ED with worsening dyspnea X 2 days, respiratory distress placed on BiPAP, BNP was 2726, troponin up to 833, chest x-ray noted moderate to large bilateral pleural effusions, interstitial edema. -Cath 8/16 with significant disease in the left system, patent LIMA-LAD; ISR in the ostial RCA treated with shockwave lithotripsy, no additional stents  Subjective: -Chest pain/pressure this morning  Assessment and Plan:  Acute on chronic respiratory failure with hypoxia  Acute on chronic systolic CHF -Required BiPAP on admission yesterday, now off -Concern for progressive CAD causing recurrent pulmonary edema -Last echo 7/23 with EF 45% -Remains on IV Lasix metoprolol and Jardiance, urine output is poor however does not appear significantly volume overloaded -BMP in a.m.  NSTEMI CAD/CABG and history of stent V-fib arrest -Multiple recent admissions with same, troponin peaked at 833, on heparin and nitro gtt. -Cards following, left heart cath yesterday with significant left-sided disease, patent LIMA/LAD, ISR in the ostial RCA treated with shockwave lithotripsy, recommended aspirin Plavix for 12 months  -Continue metoprolol and statin -Chest pain this morning, may need low-dose Imdur   SIRS -Likely secondary to NSTEMI and CHF, clinically do not suspect pneumonia, DC antibiotics and monitor   Controlled diabetes mellitus type 2 without long-term use of insulin, with hyperglycemia -Last hemoglobin A1c 6.8 on 12/06/2021.   -Continue Jardiance   Anemia of chronic kidney disease -Hemoglobin down to 7.8 this morning,, recent baseline has been in the 8.5-9  range -Off IV heparin, denies overt bleeding, anemia panel with severe iron deficiency, will add IV iron, repeat hemoglobin today   CKD stage IIIa Patient creatinine 1.09 which appears around patient's baseline. -Continue to monitor with diuresis   Hyperlipidemia -Continue atorvastatin   DVT prophylaxis: Add Lovenox Code Status: Was DNR last admission, will wish to be full code now, discussed with patient again to reconsider this Family Communication: Discussed with patient in detail, no family at bedside Disposition Plan: SNF likely 2 to 3 days  Consultants:    Procedures:   Antimicrobials:    Objective: Vitals:   02/08/22 0049 02/08/22 0353 02/08/22 0756 02/08/22 0811  BP: (!) 127/91 (!) 125/42 (!) 146/54 (!) 135/54  Pulse: 62 72 71 82  Resp: 18 19 18 18   Temp:  98.2 F (36.8 C) 97.8 F (36.6 C)   TempSrc:  Oral Oral   SpO2: 99% 99% 100% 99%  Weight:      Height:        Intake/Output Summary (Last 24 hours) at 02/08/2022 1214 Last data filed at 02/08/2022 0800 Gross per 24 hour  Intake 1001.67 ml  Output 740 ml  Net 261.67 ml   Filed Weights   02/06/22 1954 02/07/22 0039  Weight: 55.5 kg 56.8 kg    Examination:  General exam: Elderly frail chronically ill female sitting up in bed, AAOx3, no distress CVS: S1-S2, regular rhythm Lungs: Decreased breath sounds to bases Abdomen: Soft, nontender, bowel sounds present Extremities: No edema Psychiatry:  Mood & affect appropriate.     Data Reviewed:   CBC: Recent Labs  Lab 02/06/22 0404 02/06/22 0457 02/06/22 0458 02/06/22 0948 02/07/22 0658 02/08/22 0321 02/08/22 1037  WBC 16.3*  --   --   --  12.8* 10.2  --   NEUTROABS 13.7*  --   --   --   --   --   --   HGB 9.6* 10.9* 9.5* 11.2* 8.2* 7.8* 7.9*  HCT 29.9* 32.0* 28.0* 33.0* 25.5* 24.7*  --   MCV 92.9  --   --   --  92.4 93.6  --   PLT 336  --   --   --  231 224  --    Basic Metabolic Panel: Recent Labs  Lab 02/02/22 0411 02/06/22 0404  02/06/22 0457 02/06/22 0458 02/06/22 0948 02/07/22 0658 02/08/22 0321  NA 133* 134* 131* 129* 131* 132* 133*  K 3.9 3.7 3.7 3.4* 4.1 4.0 3.7  CL 94* 89* 87*  --   --  89* 91*  CO2 31 32  --   --   --  34* 32  GLUCOSE 100* 272* 276*  --   --  150* 120*  BUN 53* 26* 29*  --   --  35* 34*  CREATININE 1.04* 1.09* 1.10*  --   --  1.02* 1.09*  CALCIUM 8.0* 9.2  --   --   --  8.9 8.6*   GFR: Estimated Creatinine Clearance: 31.6 mL/min (A) (by C-G formula based on SCr of 1.09 mg/dL (H)). Liver Function Tests: Recent Labs  Lab 02/06/22 0404  AST 35  ALT 23  ALKPHOS 136*  BILITOT 1.1  PROT 6.1*  ALBUMIN 3.5   No results for input(s): "LIPASE", "AMYLASE" in the last 168 hours. No results for input(s): "AMMONIA" in the last 168 hours. Coagulation Profile: No results for input(s): "INR", "PROTIME" in the last 168 hours. Cardiac Enzymes: No results for input(s): "CKTOTAL", "CKMB", "CKMBINDEX", "TROPONINI" in the last 168 hours. BNP (last 3 results) No results for input(s): "PROBNP" in the last 8760 hours. HbA1C: No results for input(s): "HGBA1C" in the last 72 hours. CBG: Recent Labs  Lab 02/06/22 2117 02/07/22 0630 02/07/22 1812 02/07/22 2240 02/08/22 0637  GLUCAP 159* 169* 158* 119* 196*   Lipid Profile: No results for input(s): "CHOL", "HDL", "LDLCALC", "TRIG", "CHOLHDL", "LDLDIRECT" in the last 72 hours. Thyroid Function Tests: No results for input(s): "TSH", "T4TOTAL", "FREET4", "T3FREE", "THYROIDAB" in the last 72 hours. Anemia Panel: Recent Labs    02/08/22 0321  VITAMINB12 380  FOLATE 16.4  FERRITIN 40  TIBC 403  IRON 23*  RETICCTPCT 2.4   Urine analysis:    Component Value Date/Time   COLORURINE YELLOW 02/06/2022 1100   APPEARANCEUR CLEAR 02/06/2022 1100   LABSPEC 1.009 02/06/2022 1100   PHURINE 5.0 02/06/2022 1100   GLUCOSEU >=500 (A) 02/06/2022 1100   HGBUR NEGATIVE 02/06/2022 1100   BILIRUBINUR NEGATIVE 02/06/2022 1100   KETONESUR NEGATIVE  02/06/2022 1100   PROTEINUR NEGATIVE 02/06/2022 1100   NITRITE NEGATIVE 02/06/2022 1100   LEUKOCYTESUR TRACE (A) 02/06/2022 1100   Sepsis Labs: @LABRCNTIP (procalcitonin:4,lacticidven:4)  ) Recent Results (from the past 240 hour(s))  Blood culture (routine x 2)     Status: None (Preliminary result)   Collection Time: 02/06/22  4:04 AM   Specimen: BLOOD  Result Value Ref Range Status   Specimen Description BLOOD SITE NOT SPECIFIED  Final   Special Requests   Final    BOTTLES DRAWN AEROBIC AND ANAEROBIC Blood Culture results may not be optimal due to an excessive volume of blood received in culture bottles   Culture   Final    NO GROWTH 2 DAYS Performed at [facility], [address] [place], [place] [address]    Report Status PENDING  Incomplete  Blood culture (routine x 2)     Status: None (Preliminary result)   Collection Time: 02/06/22  4:18 AM   Specimen: BLOOD  Result Value Ref Range Status   Specimen Description BLOOD SITE NOT SPECIFIED  Final   Special Requests   Final    BOTTLES DRAWN AEROBIC AND ANAEROBIC Blood Culture results may not be optimal due to an excessive volume of blood received in culture bottles   Culture   Final    NO GROWTH 2 DAYS Performed at [facility], [address] [place], [place] [address]    Report Status PENDING  Incomplete  SARS Coronavirus 2 by RT PCR (hospital order, performed in [facility] hospital lab) *cepheid single result test*     Status: None   Collection Time: 02/06/22  5:16 AM   Specimen: Nasal Swab  Result Value Ref Range Status   SARS Coronavirus 2 by RT PCR NEGATIVE NEGATIVE Final    Comment: (NOTE) SARS-CoV-2 target nucleic acids are NOT DETECTED.  The SARS-CoV-2 RNA is generally detectable in upper and lower respiratory specimens during the acute phase of infection. The lowest concentration of SARS-CoV-2 viral copies this assay can detect is 250 copies / mL. A negative result does not preclude SARS-CoV-2  infection and should not be used as the sole basis for treatment or other patient management decisions.  A negative result may occur with improper specimen collection / handling, submission of specimen other than nasopharyngeal swab, presence of viral mutation(s) within the areas targeted by this assay, and inadequate number of viral copies (<250 copies / mL). A negative result must be combined with clinical observations, patient history, and epidemiological information.  Fact Sheet for Patients:   RoadLapTop.co.za  Fact Sheet for Healthcare Providers: http://kim-miller.com/  This test is not yet approved or  cleared by the [place] FDA and has been authorized for detection and/or diagnosis of SARS-CoV-2 by FDA under an Emergency Use Authorization (EUA).  This EUA will remain in effect (meaning this test can be used) for the duration of the COVID-19 declaration under Section 564(b)(1) of the Act, 21 U.S.C. section 360bbb-3(b)(1), unless the authorization is terminated or revoked sooner.  Performed at [facility], [address], [place], [place] [address]   Urine Culture     Status: Abnormal   Collection Time: 02/06/22  8:29 AM   Specimen: Urine, Clean Catch  Result Value Ref Range Status   Specimen Description URINE, CLEAN CATCH  Final   Special Requests NONE  Final   Culture (A)  Final    <10,000 COLONIES/mL INSIGNIFICANT GROWTH Performed at [facility], [address], [place], [place] [address]    Report Status 02/07/2022 FINAL  Final     Radiology Studies: CARDIAC CATHETERIZATION  Result Date: 02/07/2022   Suezanne Jacquet LM to [facility] LAD lesion is 95% stenosed.   Prox LAD to Mid LAD lesion is 100% stenosed.   Mid LAD lesion is 100% stenosed.   Mid RCA lesion is 50% stenosed.   Ost RCA lesion is 80% stenosed.   Origin lesion is 100% stenosed.   Prox Cx lesion is 80% stenosed.   Post intervention, there is a 0% residual  stenosis. 1.  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 2.  Underexpanded ostial right coronary artery stent due to surrounding calcium with a pre-Shockwave lithotripsy  area of 3.72 mm and a post shockwave area of 6.56 mm; no additional stents were implanted. 3.  LVEDP of 20 mmHg. Commendation: Dual antiplatelet therapy for at least 1 year.     Scheduled Meds:  aspirin  81 mg Oral Daily   atorvastatin  80 mg Oral QPM   clopidogrel  75 mg Oral Q breakfast   empagliflozin  10 mg Oral Daily   furosemide  40 mg Intravenous BID   metoprolol succinate  25 mg Oral Daily   sodium chloride flush  3 mL Intravenous Q12H   sodium chloride flush  3 mL Intravenous Q12H   sodium chloride flush  3 mL Intravenous Q12H   Continuous Infusions:  sodium chloride     ferric gluconate (FERRLECIT) IVPB 250 mg (02/08/22 1138)     LOS: 2 days    Time spent: 27min  Domenic Polite, MD [facility]   02/08/2022, 12:14 PM

## 2022-02-08 NOTE — Progress Notes (Addendum)
Heart Failure Nurse Navigator Progress Note  PCP: Lauro Regulus, MD PCP-Cardiologist: [facility] Admission Diagnosis: Acute on Chronic respiratory failure with hypoxia, Acute on chronic systolic congestive heart failure.  Admitted from: [facility] via EMS  Presentation:   Lindsey Pollard presented with shortness of breath, hot to touch, wears 2 L [place] at baseline. recently discharged from the hospital 3 days ago. BP 147/43, HR 64, Respiratory distress on CPAP.BNP 2,726.8, Troponin 46, Cath 8/16 with significant disease .   Patient was educated on the sign and symptoms of heart disease, daily weights, diet/ fluid restrictions, taking all her medications as prescribed and attending her medical appointments. Patient stated that her friend takes her to her appointments because she doesn't speak to her 3 kids. A HF TOC appointment was set up per Dr. Jomarie Longs request for 02/20/2022 @ 2 pm. ** Will cancel HF TOC appointment, Patient will be going home now with Hospice. **  ECHO/ LVEF: 45-50%   Clinical Course:  Past Medical History:  Diagnosis Date   Chest pain 12/06/2021   Diabetes mellitus without complication (HCC)    Hyperlipidemia    Hypertension    Myocardial infarction [facility])      Social History   Socioeconomic History   Marital status: Divorced    Spouse name: Not on file   Number of children: 3   Years of education: Not on file   Highest education level: High school graduate  Occupational History   Occupation: Retired  Tobacco Use   Smoking status: Former    Types: Cigarettes    Quit date: 09/1981    Years since quitting: 40.4   Smokeless tobacco: Never  Vaping Use   Vaping Use: Not on file  Substance and Sexual Activity   Alcohol use: Not Currently   Drug use: Not Currently   Sexual activity: Not on file  Other Topics Concern   Not on file  Social History Narrative   Not on file   Social Determinants of Health   Financial Resource Strain: Low Risk   (02/08/2022)   Overall Financial Resource Strain (CARDIA)    Difficulty of Paying Living Expenses: Not very hard  Food Insecurity: No Food Insecurity (02/08/2022)   Hunger Vital Sign    Worried About Running Out of Food in the Last Year: Never true    Ran Out of Food in the Last Year: Never true  Transportation Needs: No Transportation Needs (02/08/2022)   PRAPARE - Administrator, Civil Service (Medical): No    Lack of Transportation (Non-Medical): No  Physical Activity: Not on file  Stress: Not on file  Social Connections: Not on file   Education Assessment and Provision:  Detailed education and instructions provided on heart failure disease management including the following:  Signs and symptoms of Heart Failure When to call the physician Importance of daily weights Low sodium diet Fluid restriction Medication management Anticipated future follow-up appointments  Patient education given on each of the above topics.  Patient acknowledges understanding via teach back method and acceptance of all instructions.  Education Materials:  "Living Better With Heart Failure" Booklet, HF zone tool, & Daily Weight Tracker Tool.  Patient has scale at home: yes Patient has pill box at home: NA    High Risk Criteria for Readmission and/or Poor Patient Outcomes: Heart failure hospital admissions (last 6 months): 1  No Show rate: 13 % Difficult social situation: No Demonstrates medication adherence: Yes Primary Language: English Literacy level: Reading,  writing, and comprehension  Barriers of Care:   Diet/ fluid restrictions Daily weights  Considerations/Referrals:   Referral made to Heart Failure Pharmacist Stewardship: yes Referral made to Heart Failure CSW/NCM TOC: No Referral made to Heart & Vascular TOC clinic: Yes, 02/20/2022 @ 2 pm per Dr. Jomarie Longs  Items for Follow-up on DC/TOC: Diet/ fluid restrictions Daily weights   Rhae Hammock, BSN, RN Heart Failure Social worker Chat Only

## 2022-02-09 ENCOUNTER — Inpatient Hospital Stay (HOSPITAL_COMMUNITY): Payer: Medicare Other

## 2022-02-09 DIAGNOSIS — I5043 Acute on chronic combined systolic (congestive) and diastolic (congestive) heart failure: Secondary | ICD-10-CM | POA: Diagnosis not present

## 2022-02-09 LAB — COMPREHENSIVE METABOLIC PANEL
ALT: 18 U/L (ref 0–44)
AST: 18 U/L (ref 15–41)
Albumin: 2.9 g/dL — ABNORMAL LOW (ref 3.5–5.0)
Alkaline Phosphatase: 84 U/L (ref 38–126)
Anion gap: 11 (ref 5–15)
BUN: 34 mg/dL — ABNORMAL HIGH (ref 8–23)
CO2: 32 mmol/L (ref 22–32)
Calcium: 8.8 mg/dL — ABNORMAL LOW (ref 8.9–10.3)
Chloride: 92 mmol/L — ABNORMAL LOW (ref 98–111)
Creatinine, Ser: 1.02 mg/dL — ABNORMAL HIGH (ref 0.44–1.00)
GFR, Estimated: 56 mL/min — ABNORMAL LOW (ref >60)
Glucose, Bld: 146 mg/dL — ABNORMAL HIGH (ref 70–99)
Potassium: 3.3 mmol/L — ABNORMAL LOW (ref 3.5–5.1)
Sodium: 135 mmol/L (ref 135–145)
Total Bilirubin: 1 mg/dL (ref 0.3–1.2)
Total Protein: 5.3 g/dL — ABNORMAL LOW (ref 6.5–8.1)

## 2022-02-09 LAB — CBC
HCT: 24 % — ABNORMAL LOW (ref 36.0–46.0)
Hemoglobin: 7.8 g/dL — ABNORMAL LOW (ref 12.0–15.0)
MCH: 30 pg (ref 26.0–34.0)
MCHC: 32.5 g/dL (ref 30.0–36.0)
MCV: 92.3 fL (ref 80.0–100.0)
Platelets: 218 10*3/uL (ref 150–400)
RBC: 2.6 MIL/uL — ABNORMAL LOW (ref 3.87–5.11)
RDW: 15.7 % — ABNORMAL HIGH (ref 11.5–15.5)
WBC: 10.5 10*3/uL (ref 4.0–10.5)
nRBC: 0 % (ref 0.0–0.2)

## 2022-02-09 LAB — MAGNESIUM: Magnesium: 2.4 mg/dL (ref 1.7–2.4)

## 2022-02-09 LAB — LIPOPROTEIN A (LPA): Lipoprotein (a): 115.8 nmol/L — ABNORMAL HIGH (ref <75.0)

## 2022-02-09 MED ORDER — FUROSEMIDE 10 MG/ML IJ SOLN
80.0000 mg | Freq: Two times a day (BID) | INTRAMUSCULAR | Status: DC
Start: 2022-02-09 — End: 2022-02-14
  Administered 2022-02-09 – 2022-02-14 (×11): 80 mg via INTRAVENOUS
  Filled 2022-02-09 (×11): qty 8

## 2022-02-09 MED ORDER — POTASSIUM CHLORIDE CRYS ER 20 MEQ PO TBCR
40.0000 meq | EXTENDED_RELEASE_TABLET | Freq: Two times a day (BID) | ORAL | Status: AC
Start: 2022-02-09 — End: 2022-02-09
  Administered 2022-02-09 (×2): 40 meq via ORAL
  Filled 2022-02-09 (×2): qty 2

## 2022-02-09 NOTE — Progress Notes (Addendum)
Progress Note  Patient Name: Lindsey Pollard Date of Encounter: 02/09/2022  [facility] Cardiologist: Dr. Clayborn Bigness  Subjective   Overnight, telemetry appears to have atrial bigeminy and converted to atrial flutter / coarse fib. This coincided with increased shortness of breath and WOB requiring BiPAP therapy.   This morning, pt remains in Afib with controlled ventricular rate in the 60-70s.  Upon entering the room, pt is on HF [place] with 100% O2 sat, but increased work of breathing. She has been given 80 mg IV lasix, crackles and rhonchi bilaterally. CXR with edema.   Inpatient Medications    Scheduled Meds:  aspirin  81 mg Oral Daily   atorvastatin  80 mg Oral QPM   clopidogrel  75 mg Oral Q breakfast   empagliflozin  10 mg Oral Daily   enoxaparin (LOVENOX) injection  40 mg Subcutaneous Q24H   furosemide  80 mg Intravenous BID   metoprolol succinate  25 mg Oral Daily   potassium chloride  40 mEq Oral BID   sodium chloride flush  3 mL Intravenous Q12H   sodium chloride flush  3 mL Intravenous Q12H   sodium chloride flush  3 mL Intravenous Q12H   Continuous Infusions:  sodium chloride     ferric gluconate (FERRLECIT) IVPB Stopped (02/08/22 1338)   PRN Meds: sodium chloride, acetaminophen **OR** acetaminophen, acetaminophen, albuterol, ALPRAZolam, nitroGLYCERIN, ondansetron (ZOFRAN) IV, sodium chloride flush   Vital Signs    Vitals:   02/09/22 0304 02/09/22 0413 02/09/22 0607 02/09/22 0700  BP: (!) 138/53 (!) 138/53 (!) 113/53   Pulse: 68 71 76   Resp: (!) 30 (!) 22 20   Temp: (!) 97.5 F (36.4 C) (!) 97.5 F (36.4 C) (!) 97.4 F (36.3 C)   TempSrc:  Oral Axillary   SpO2:  99% 100%   Weight:    58.6 kg  Height:        Intake/Output Summary (Last 24 hours) at 02/09/2022 0801 Last data filed at 02/09/2022 0244 Gross per 24 hour  Intake 883.41 ml  Output 1000 ml  Net -116.59 ml      02/09/2022    7:00 AM 02/07/2022   12:39 AM 02/06/2022    7:54 PM  Last 3  Weights  Weight (lbs) 129 lb 3 oz 125 lb 3.5 oz 122 lb 5.7 oz  Weight (kg) 58.6 kg 56.8 kg 55.5 kg      Telemetry    Sinus --> atrial bigeminy --> atrial flutter --> atrial fibrillation, rate controlled in the 60-70s - Personally Reviewed  ECG    Atrial fibrillation with VR 74 with PVC - Personally Reviewed  Physical Exam   GEN: mild distress  Neck: No JVD Cardiac: irregular rhythm, regular rate.  Respiratory: crackles throughout GI: Soft, nontender, non-distended  MS: No edema; No deformity. Neuro:  Nonfocal  Psych: Normal affect   Labs    High Sensitivity Troponin:   Recent Labs  Lab 02/06/22 0629 02/06/22 0933 02/06/22 1052 02/08/22 0836 02/08/22 1037  TROPONINIHS 145* 621* 833* 462* 421*     Chemistry Recent Labs  Lab 02/06/22 0404 02/06/22 0457 02/07/22 0658 02/08/22 0321 02/09/22 0407  NA 134*   < > 132* 133* 135  K 3.7   < > 4.0 3.7 3.3*  CL 89*   < > 89* 91* 92*  CO2 32  --  34* 32 32  GLUCOSE 272*   < > 150* 120* 146*  BUN 26*   < > 35* 34*  34*  CREATININE 1.09*   < > 1.02* 1.09* 1.02*  CALCIUM 9.2  --  8.9 8.6* 8.8*  MG  --   --   --   --  2.4  PROT 6.1*  --   --   --  5.3*  ALBUMIN 3.5  --   --   --  2.9*  AST 35  --   --   --  18  ALT 23  --   --   --  18  ALKPHOS 136*  --   --   --  84  BILITOT 1.1  --   --   --  1.0  GFRNONAA 51*  --  56* 51* 56*  ANIONGAP 13  --  9 10 11    < > = values in this interval not displayed.    Lipids No results for input(s): "CHOL", "TRIG", "HDL", "LABVLDL", "LDLCALC", "CHOLHDL" in the last 168 hours.  Hematology Recent Labs  Lab 02/07/22 0658 02/08/22 0321 02/08/22 1037 02/09/22 0407  WBC 12.8* 10.2  --  10.5  RBC 2.76* 2.64*  2.62*  --  2.60*  HGB 8.2* 7.8* 7.9* 7.8*  HCT 25.5* 24.7*  --  24.0*  MCV 92.4 93.6  --  92.3  MCH 29.7 29.5  --  30.0  MCHC 32.2 31.6  --  32.5  RDW 14.9 15.4  --  15.7*  PLT 231 224  --  218   Thyroid No results for input(s): "TSH", "FREET4" in the last 168 hours.   BNP Recent Labs  Lab 02/06/22 0404  BNP 2,726.8*    DDimer No results for input(s): "DDIMER" in the last 168 hours.   Radiology    CARDIAC CATHETERIZATION  Result Date: 02/07/2022   02/09/2022 to [facility] LAD lesion is 95% stenosed.   Prox LAD to Mid LAD lesion is 100% stenosed.   Mid LAD lesion is 100% stenosed.   Mid RCA lesion is 50% stenosed.   Ost RCA lesion is 80% stenosed.   Origin lesion is 100% stenosed.   Prox Cx lesion is 80% stenosed.   Post intervention, there is a 0% residual stenosis. 1.  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 2.  Underexpanded ostial right coronary artery stent due to surrounding calcium with a pre-Shockwave lithotripsy area of 3.72 mm and a post shockwave area of 6.56 mm; no additional stents were implanted. 3.  LVEDP of 20 mmHg. Commendation: Dual antiplatelet therapy for at least 1 year.    Cardiac Studies   Left heart cath 02/07/22: Conclusion    Ost LM to Ost LAD lesion is 95% stenosed.   Prox LAD to Mid LAD lesion is 100% stenosed.   Mid LAD lesion is 100% stenosed.   Mid RCA lesion is 50% stenosed.   Ost RCA lesion is 80% stenosed.   Origin lesion is 100% stenosed.   Prox Cx lesion is 80% stenosed.   Post intervention, there is a 0% residual stenosis.   1.  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 2.  Underexpanded ostial right coronary artery stent due to surrounding calcium with a pre-Shockwave lithotripsy area of 3.72 mm and a post shockwave area of 6.56 mm; no additional stents were implanted. 3.  LVEDP of 20 mmHg.   Commendation: Dual antiplatelet therapy for at least 1 year.         Diagnostic Dominance: Right  Intervention  Patient Profile     81 y.o. female  with a hx of CAD s/p CABG in 2018, ICM , CKD-3, HLD, DM-2, VF arrest, GERD who is being seen 02/06/2022 for the evaluation of acute CHF with ICM at the request of Dr.  Katrinka Blazing.  Assessment & Plan    CAD  Hx of CABG wit RIMA-RCA, LIMA-LAD, hx of Vfib arrest Heart catheterization 02/07/22 with significant disease in the left system, patent LIMA-LAD, ISR in ostial RCA treated with shockwave lithotripsy, no additional stent, RIMA-RCA occluded - pt was placed on ASA and plavx x 12 months - plan to continue BB, jardiance, and lipitor   Atrial fibrillation/fluter - new onset Pt converted to rate controlled Afib overnight at the same time she had some respiratory distress requiring brief BiPAP Telemetry with atrial bigeminy at approximately 0258 that deteriorated to atrial flutter / fibrillation, pt remained rate controlled - will need to discuss anticoagulation given increased stroke risk, decision complicated by her anemia - K 3.3, need to replace - Mg 2.4 - check TSH   Anemia - baseline Hb 8-9 range - Hb stable at 7.8, no signs of active bleeding - groin without hematoma   Acute dyspnea, increased WOB Ischemic cardiomyopathy Echo 01/12/22 with LVEF 45-50% (was 55-60% 12/07/21) - has continued diuresing on 80 mg lasix IV BID -agree with continuing this after examination today, may need additional 40 mg IV lasix - home dose was 40 mg lasix daily - GDMT includes 25 mg toprol, jardiance - BP labile overnight - may be able to add spironolactone 12.5 mg daily - only 200 cc urine output this morning, was 1000 cc charted yesterday, bladder scan showing 125 cc, sCr stable at 1.02 with CrCl 34.3 ml/min   Poor mobility prior to admission SIRS CKD IIIa Renal function stable Per primary      For questions or updates, please contact [facility] Please consult [email or web address] for contact info under        Signed, Marcelino Duster, PA  02/09/2022, 8:01 AM     I have seen and examined the patient along with Marcelino Duster, PA .  I have reviewed the chart, notes and new data.  I agree with PA/NP's note.  Key new complaints: Breathing has improved  following loop diuretic Key examination changes: Rhythm remains irregular, has very few rales in the bases, mostly clear.  Remains mildly tachypneic but not in acute distress. Key new findings / data: Telemetry shows atrial fibrillation, but with controlled ventricular rates.  Potassium 3.3  PLAN: Continue loop diuretics and beta-blocker.  Consider repeat echocardiogram if she fails to improve.  Hemoglobin is stable, but if she continues to have problems with congestive heart failure and/or angina, she may benefit from one unit of PRBC transfusion.  Thurmon Fair, MD, [facility] [phone] 02/09/2022, 12:28 PM

## 2022-02-09 NOTE — Progress Notes (Signed)
RT called to patient's room due to shortness of breath and increased work of breathing patient was placed on BIPAP. Patient stated that the shortness of breath is better, her work of breathing has improved. RT will continue to monitor

## 2022-02-09 NOTE — Progress Notes (Signed)
Patient complaining of shortness of breath. Notified MD. Ordered patient to go back on BIPAP. Sabra Heck, RN

## 2022-02-09 NOTE — Progress Notes (Signed)
   02/09/22 0221  Assess: MEWS Score  BP 139/65  Pulse Rate 77  ECG Heart Rate 77  Resp (!) 26  Level of Consciousness Alert  SpO2 90 %  O2 Device CPAP  Assess: MEWS Score  MEWS Temp 0  MEWS Systolic 0  MEWS Pulse 0  MEWS RR 2  MEWS LOC 0  MEWS Score 2  MEWS Score Color Yellow  Assess: if the MEWS score is Yellow or Red  Were vital signs taken at a resting state? Yes  Focused Assessment Change from prior assessment (see assessment flowsheet)  Does the patient meet 2 or more of the SIRS criteria? No  Does the patient have a confirmed or suspected source of infection? No  MEWS guidelines implemented *See Row Information* Yes  Treat  MEWS Interventions Escalated (See documentation below)  Pain Scale 0-10  Pain Score 0  Take Vital Signs  Increase Vital Sign Frequency  Yellow: Q 2hr X 2 then Q 4hr X 2, if remains yellow, continue Q 4hrs  Escalate  MEWS: Escalate Yellow: discuss with charge nurse/RN and consider discussing with provider and RRT  Notify: Charge Nurse/RN  Name of Charge Nurse/RN Notified Gavin Pound  Date Charge Nurse/RN Notified 02/09/22  Time Charge Nurse/RN Notified [id number]  Notify: Provider  Provider Name/Title Carollee Herter, DO  Date Provider Notified 02/09/22  Time Provider Notified 0240  Method of Notification Page  Notification Reason Change in status  Provider response No new orders  Date of Provider Response 02/09/22  Time of Provider Response 0242  Document  Patient Outcome Not stable and remains on department  Assess: SIRS CRITERIA  SIRS Temperature  0  SIRS Pulse 0  SIRS Respirations  1  SIRS WBC 1  SIRS Score Sum  2

## 2022-02-09 NOTE — Progress Notes (Signed)
Pt complains short of breath. Lung sounds fine crackles. Albuterol neb give. Dr. Imogene Burn (on call) was notified and no new orders given. Will continue to monitor pt.  02/09/22 0221  Vitals  BP 139/65  BP Location Left Arm  BP Method Automatic  Patient Position (if appropriate) Lying  Pulse Rate 77  ECG Heart Rate 77  Resp (!) 26  Level of Consciousness  Level of Consciousness Alert  MEWS COLOR  MEWS Score Color Yellow  Oxygen Therapy  SpO2 90 %  O2 Device CPAP  Pain Assessment  Pain Scale 0-10  Pain Score 0  MEWS Score  MEWS Temp 0  MEWS Systolic 0  MEWS Pulse 0  MEWS RR 2  MEWS LOC 0  MEWS Score 2

## 2022-02-09 NOTE — Progress Notes (Signed)
Mobility Specialist Progress Note    02/09/22 1530  Mobility  Activity Refused mobility   C/o being too tired after sitting up to eat. Also c/o feeling like she can't catch her breath and noticeably breathing heavily.  RN notified. Will f/u as appropriate.   [place] Mobility Specialist

## 2022-02-09 NOTE — Progress Notes (Signed)
Pt was placed back on BIPAP 14/6 R10 30%. Pt has some mild SOB on 4L [place] prior to BIPAP placement. Pt seems more comfortable on BIPAP and is tolerating well at this time. RT will monitor.

## 2022-02-09 NOTE — Progress Notes (Signed)
  X-cover Note: Bedside RN reports pt converted to slow afib with HR 60-80s.   Carollee Herter, DO [facility]

## 2022-02-09 NOTE — Care Management Important Message (Signed)
Important Message  Patient Details  Name: Lindsey Pollard MRN: [id number] Date of Birth: 19-Aug-1940   Medicare Important Message Given:  Yes     Dorena Bodo 02/09/2022, 2:42 PM

## 2022-02-09 NOTE — Progress Notes (Signed)
PROGRESS NOTE    Lindsey Pollard  PXT:[id number] DOB: [birth year] DOA: 02/06/2022 PCP: Lauro Regulus, MD  81/F with history of CAD, CABG, ischemic cardiomyopathy, CKD 3 AAA, type 2 diabetes mellitus, recent hospitalizations with NSTEMI, V-fib arrest, ventilator dependent respiratory failure followed by frequent admissions with CHF Back in the ED with worsening dyspnea X 2 days, respiratory distress placed on BiPAP, BNP was 2726, troponin up to 833, chest x-ray noted moderate to large bilateral pleural effusions, interstitial edema. -Cath 8/16 with significant disease in the left system, patent LIMA-LAD; ISR in the ostial RCA treated with shockwave lithotripsy, no additional stents -Recurrent flash pulmonary edema 8/18  Subjective: -Chest pain/pressure this morning  Assessment and Plan:  Acute on chronic respiratory failure with hypoxia  Acute on chronic systolic CHF -Required BiPAP on admission yesterday, now off -Concern for progressive CAD causing recurrent pulmonary edema had RCA occlusion treated with shockwave lithotripsy 8/16 -Last echo 7/23 with EF 45% -Repeat IV Lasix, will increase dose, continue metoprolol and Jardiance -BMP in a.m.  NSTEMI CAD/CABG and history of stent V-fib arrest -Multiple recent admissions with same, troponin peaked at 833, on heparin and nitro gtt. -Cards following, left heart cath 8/16 with significant left-sided disease, patent LIMA/LAD, ISR in the ostial RCA treated with shockwave lithotripsy, recommended aspirin Plavix for 12 months  -Continue metoprolol and statin -Chest pain this morning, may need low-dose Imdur   SIRS -Likely secondary to NSTEMI and CHF, clinically do not suspect pneumonia, DC antibiotics and monitor   Controlled diabetes mellitus type 2 without long-term use of insulin, with hyperglycemia -Last hemoglobin A1c 6.8 on 12/06/2021.   -Continue Jardiance   Anemia of chronic kidney disease -Hemoglobin down to 7.8 this  morning,, recent baseline has been in the 8.5-9 range -Off IV heparin, denies overt bleeding, anemia panel with severe iron deficiency -Continue IV iron, hemoglobin is 7.8, will transfuse if Hb trends down further  CKD stage IIIa Patient creatinine 1.09 which appears around patient's baseline. -Continue to monitor with diuresis   Hyperlipidemia -Continue atorvastatin   DVT prophylaxis: Add Lovenox Code Status: Was DNR last admission, will wish to be full code now, discussed with patient again to reconsider this Family Communication: Discussed with patient in detail, no family at bedside Disposition Plan: SNF likely 2 to 3 days  Consultants:  Cardiology   Procedures: Cath 8/16 with significant disease in the left system, patent LIMA-LAD; ISR in the ostial RCA treated with shockwave lithotripsy, no additional stents  Antimicrobials:    Objective: Vitals:   02/09/22 0607 02/09/22 0700 02/09/22 0809 02/09/22 1145  BP: (!) 113/53  (!) 165/58 (!) 158/59  Pulse: 76  69 78  Resp: 20  18 (!) 24  Temp: (!) 97.4 F (36.3 C)   97.8 F (36.6 C)  TempSrc: Axillary   Oral  SpO2: 100%  99% 100%  Weight:  58.6 kg    Height:        Intake/Output Summary (Last 24 hours) at 02/09/2022 1343 Last data filed at 02/09/2022 1257 Gross per 24 hour  Intake 763.41 ml  Output 1850 ml  Net -1086.59 ml   Filed Weights   02/06/22 1954 02/07/22 0039 02/09/22 0700  Weight: 55.5 kg 56.8 kg 58.6 kg    Examination:  General exam: Elderly frail chronically ill female sitting up in bed, AAOx3, uncomfortable appearing HEENT: Positive JVD CVS: S1-S2, irregularly irregular rhythm Lungs: Bilateral Rales noted Abdomen: Soft, nontender, bowel sounds present Extremities: No edema Psychiatry:  Mood &  affect appropriate.     Data Reviewed:   CBC: Recent Labs  Lab 02/06/22 0404 02/06/22 0457 02/06/22 0458 02/06/22 0948 02/07/22 0658 02/08/22 0321 02/08/22 1037 02/09/22 0407  WBC 16.3*  --    --   --  12.8* 10.2  --  10.5  NEUTROABS 13.7*  --   --   --   --   --   --   --   HGB 9.6*   < > 9.5* 11.2* 8.2* 7.8* 7.9* 7.8*  HCT 29.9*   < > 28.0* 33.0* 25.5* 24.7*  --  24.0*  MCV 92.9  --   --   --  92.4 93.6  --  92.3  PLT 336  --   --   --  231 224  --  218   < > = values in this interval not displayed.   Basic Metabolic Panel: Recent Labs  Lab 02/06/22 0404 02/06/22 0457 02/06/22 0458 02/06/22 0948 02/07/22 0658 02/08/22 0321 02/09/22 0407  NA 134* 131* 129* 131* 132* 133* 135  K 3.7 3.7 3.4* 4.1 4.0 3.7 3.3*  CL 89* 87*  --   --  89* 91* 92*  CO2 32  --   --   --  34* 32 32  GLUCOSE 272* 276*  --   --  150* 120* 146*  BUN 26* 29*  --   --  35* 34* 34*  CREATININE 1.09* 1.10*  --   --  1.02* 1.09* 1.02*  CALCIUM 9.2  --   --   --  8.9 8.6* 8.8*  MG  --   --   --   --   --   --  2.4   GFR: Estimated Creatinine Clearance: 34.3 mL/min (A) (by C-G formula based on SCr of 1.02 mg/dL (H)). Liver Function Tests: Recent Labs  Lab 02/06/22 0404 02/09/22 0407  AST 35 18  ALT 23 18  ALKPHOS 136* 84  BILITOT 1.1 1.0  PROT 6.1* 5.3*  ALBUMIN 3.5 2.9*   No results for input(s): "LIPASE", "AMYLASE" in the last 168 hours. No results for input(s): "AMMONIA" in the last 168 hours. Coagulation Profile: No results for input(s): "INR", "PROTIME" in the last 168 hours. Cardiac Enzymes: No results for input(s): "CKTOTAL", "CKMB", "CKMBINDEX", "TROPONINI" in the last 168 hours. BNP (last 3 results) No results for input(s): "PROBNP" in the last 8760 hours. HbA1C: No results for input(s): "HGBA1C" in the last 72 hours. CBG: Recent Labs  Lab 02/06/22 2117 02/07/22 0630 02/07/22 1812 02/07/22 2240 02/08/22 0637  GLUCAP 159* 169* 158* 119* 196*   Lipid Profile: No results for input(s): "CHOL", "HDL", "LDLCALC", "TRIG", "CHOLHDL", "LDLDIRECT" in the last 72 hours. Thyroid Function Tests: No results for input(s): "TSH", "T4TOTAL", "FREET4", "T3FREE", "THYROIDAB" in the  last 72 hours. Anemia Panel: Recent Labs    02/08/22 0321  VITAMINB12 380  FOLATE 16.4  FERRITIN 40  TIBC 403  IRON 23*  RETICCTPCT 2.4   Urine analysis:    Component Value Date/Time   COLORURINE YELLOW 02/06/2022 1100   APPEARANCEUR CLEAR 02/06/2022 1100   LABSPEC 1.009 02/06/2022 1100   PHURINE 5.0 02/06/2022 1100   GLUCOSEU >=500 (A) 02/06/2022 1100   HGBUR NEGATIVE 02/06/2022 1100   BILIRUBINUR NEGATIVE 02/06/2022 1100   KETONESUR NEGATIVE 02/06/2022 1100   PROTEINUR NEGATIVE 02/06/2022 1100   NITRITE NEGATIVE 02/06/2022 1100   LEUKOCYTESUR TRACE (A) 02/06/2022 1100   Sepsis Labs: @LABRCNTIP (procalcitonin:4,lacticidven:4)  ) Recent Results (from the past 240  hour(s))  Blood culture (routine x 2)     Status: None (Preliminary result)   Collection Time: 02/06/22  4:04 AM   Specimen: BLOOD  Result Value Ref Range Status   Specimen Description BLOOD SITE NOT SPECIFIED  Final   Special Requests   Final    BOTTLES DRAWN AEROBIC AND ANAEROBIC Blood Culture results may not be optimal due to an excessive volume of blood received in culture bottles   Culture   Final    NO GROWTH 3 DAYS Performed at [facility], [address], [place], [place] [address]    Report Status PENDING  Incomplete  Blood culture (routine x 2)     Status: None (Preliminary result)   Collection Time: 02/06/22  4:18 AM   Specimen: BLOOD  Result Value Ref Range Status   Specimen Description BLOOD SITE NOT SPECIFIED  Final   Special Requests   Final    BOTTLES DRAWN AEROBIC AND ANAEROBIC Blood Culture results may not be optimal due to an excessive volume of blood received in culture bottles   Culture   Final    NO GROWTH 3 DAYS Performed at [facility], [address] [place], [place] [address]    Report Status PENDING  Incomplete  SARS Coronavirus 2 by RT PCR (hospital order, performed in [facility] hospital lab) *cepheid single result test*     Status: None   Collection Time:  02/06/22  5:16 AM   Specimen: Nasal Swab  Result Value Ref Range Status   SARS Coronavirus 2 by RT PCR NEGATIVE NEGATIVE Final    Comment: (NOTE) SARS-CoV-2 target nucleic acids are NOT DETECTED.  The SARS-CoV-2 RNA is generally detectable in upper and lower respiratory specimens during the acute phase of infection. The lowest concentration of SARS-CoV-2 viral copies this assay can detect is 250 copies / mL. A negative result does not preclude SARS-CoV-2 infection and should not be used as the sole basis for treatment or other patient management decisions.  A negative result may occur with improper specimen collection / handling, submission of specimen other than nasopharyngeal swab, presence of viral mutation(s) within the areas targeted by this assay, and inadequate number of viral copies (<250 copies / mL). A negative result must be combined with clinical observations, patient history, and epidemiological information.  Fact Sheet for Patients:   RoadLapTop.co.za  Fact Sheet for Healthcare Providers: http://kim-miller.com/  This test is not yet approved or  cleared by the [place] FDA and has been authorized for detection and/or diagnosis of SARS-CoV-2 by FDA under an Emergency Use Authorization (EUA).  This EUA will remain in effect (meaning this test can be used) for the duration of the COVID-19 declaration under Section 564(b)(1) of the Act, 21 U.S.C. section 360bbb-3(b)(1), unless the authorization is terminated or revoked sooner.  Performed at [facility], [address], [place], [place] [address]   Urine Culture     Status: Abnormal   Collection Time: 02/06/22  8:29 AM   Specimen: Urine, Clean Catch  Result Value Ref Range Status   Specimen Description URINE, CLEAN CATCH  Final   Special Requests NONE  Final   Culture (A)  Final    <10,000 COLONIES/mL INSIGNIFICANT GROWTH Performed at [facility],  [address], [place], [place] [address]    Report Status 02/07/2022 FINAL  Final     Radiology Studies: DG CHEST PORT 1 VIEW  Result Date: 02/09/2022 CLINICAL DATA:  Hypoxia. EXAM: PORTABLE CHEST 1 VIEW COMPARISON:  02/06/2022 FINDINGS: Examination demonstrates adequate lung volumes with persistent hazy perihilar and bibasilar opacification likely interstitial edema which is unchanged to slightly worse. Stable moderate opacification of the left base likely moderate effusion with associated atelectasis. Slight worsening hazy opacification right base likely effusion with atelectasis. Infection in the mid to lower lungs is possible. Mild stable cardiomegaly. Remainder of the exam is unchanged. IMPRESSION: 1. Stable to slightly worse bilateral perihilar and bibasilar opacification likely interstitial edema with bilateral effusions/atelectasis. Infection in the mid to lower lungs is possible. 2. Stable cardiomegaly. Electronically Signed   By: Marin Olp M.D.   On: 02/09/2022 08:29   CARDIAC CATHETERIZATION  Result Date: 02/07/2022   Colon Flattery LM to [facility] LAD lesion is 95% stenosed.   Prox LAD to Mid LAD lesion is 100% stenosed.   Mid LAD lesion is 100% stenosed.   Mid RCA lesion is 50% stenosed.   Ost RCA lesion is 80% stenosed.   Origin lesion is 100% stenosed.   Prox Cx lesion is 80% stenosed.   Post intervention, there is a 0% residual stenosis. 1.  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 2.  Underexpanded ostial right coronary artery stent due to surrounding calcium with a pre-Shockwave lithotripsy area of 3.72 mm and a post shockwave area of 6.56 mm; no additional stents were implanted. 3.  LVEDP of 20 mmHg. Commendation: Dual antiplatelet therapy for at least 1 year.     Scheduled Meds:  aspirin  81 mg Oral Daily   atorvastatin  80 mg Oral QPM   clopidogrel  75 mg Oral Q breakfast   empagliflozin  10 mg Oral Daily   enoxaparin (LOVENOX) injection  40 mg  Subcutaneous Q24H   furosemide  80 mg Intravenous BID   metoprolol succinate  25 mg Oral Daily   potassium chloride  40 mEq Oral BID   sodium chloride flush  3 mL Intravenous Q12H   sodium chloride flush  3 mL Intravenous Q12H   sodium chloride flush  3 mL Intravenous Q12H   Continuous Infusions:  sodium chloride       LOS: 3 days    Time spent: 13min  Domenic Polite, MD [facility]   02/09/2022, 1:43 PM

## 2022-02-10 DIAGNOSIS — I5043 Acute on chronic combined systolic (congestive) and diastolic (congestive) heart failure: Secondary | ICD-10-CM | POA: Diagnosis not present

## 2022-02-10 LAB — BASIC METABOLIC PANEL
Anion gap: 11 (ref 5–15)
BUN: 28 mg/dL — ABNORMAL HIGH (ref 8–23)
CO2: 34 mmol/L — ABNORMAL HIGH (ref 22–32)
Calcium: 9.2 mg/dL (ref 8.9–10.3)
Chloride: 91 mmol/L — ABNORMAL LOW (ref 98–111)
Creatinine, Ser: 0.96 mg/dL (ref 0.44–1.00)
GFR, Estimated: 60 mL/min — ABNORMAL LOW (ref >60)
Glucose, Bld: 110 mg/dL — ABNORMAL HIGH (ref 70–99)
Potassium: 4.4 mmol/L (ref 3.5–5.1)
Sodium: 136 mmol/L (ref 135–145)

## 2022-02-10 LAB — CBC
HCT: 27.3 % — ABNORMAL LOW (ref 36.0–46.0)
Hemoglobin: 8.7 g/dL — ABNORMAL LOW (ref 12.0–15.0)
MCH: 29.9 pg (ref 26.0–34.0)
MCHC: 31.9 g/dL (ref 30.0–36.0)
MCV: 93.8 fL (ref 80.0–100.0)
Platelets: 260 10*3/uL (ref 150–400)
RBC: 2.91 MIL/uL — ABNORMAL LOW (ref 3.87–5.11)
RDW: 15.6 % — ABNORMAL HIGH (ref 11.5–15.5)
WBC: 11.7 10*3/uL — ABNORMAL HIGH (ref 4.0–10.5)
nRBC: 0 % (ref 0.0–0.2)

## 2022-02-10 LAB — HEPARIN LEVEL (UNFRACTIONATED): Heparin Unfractionated: 0.47 IU/mL (ref 0.30–0.70)

## 2022-02-10 LAB — PROCALCITONIN: Procalcitonin: <0.1 ng/mL

## 2022-02-10 MED ORDER — METOLAZONE 5 MG PO TABS
5.0000 mg | ORAL_TABLET | Freq: Once | ORAL | Status: AC
Start: 2022-02-10 — End: 2022-02-10
  Administered 2022-02-10: 5 mg via ORAL
  Filled 2022-02-10: qty 1

## 2022-02-10 MED ORDER — HEPARIN BOLUS VIA INFUSION
2700.0000 [IU] | Freq: Once | INTRAVENOUS | Status: AC
  Administered 2022-02-10: 2700 [IU] via INTRAVENOUS
  Filled 2022-02-10: qty 2700

## 2022-02-10 MED ORDER — POTASSIUM CHLORIDE CRYS ER 20 MEQ PO TBCR
40.0000 meq | EXTENDED_RELEASE_TABLET | Freq: Every day | ORAL | Status: AC
  Administered 2022-02-10 – 2022-02-11 (×2): 40 meq via ORAL
  Filled 2022-02-10 (×2): qty 2

## 2022-02-10 MED ORDER — NITROGLYCERIN IN D5W 200-5 MCG/ML-% IV SOLN
10.0000 ug/min | INTRAVENOUS | Status: DC
  Administered 2022-02-10: 10 ug/min via INTRAVENOUS
  Filled 2022-02-10: qty 250

## 2022-02-10 MED ORDER — HEPARIN (PORCINE) 25000 UT/250ML-% IV SOLN
750.0000 [IU]/h | INTRAVENOUS | Status: DC
  Administered 2022-02-10: 750 [IU]/h via INTRAVENOUS
  Filled 2022-02-10: qty 250

## 2022-02-10 NOTE — Progress Notes (Signed)
ANTICOAGULATION CONSULT NOTE  Pharmacy Consult for Heparin Indication: atrial fibrillation  Allergies  Allergen Reactions   Tetanus Toxoids Swelling    Tetanus and diphtheria toxids   Penicillin G Rash    Patient Measurements: Height: 4\' 11"  (149.9 cm) Weight: 58.6 kg (129 lb 3 oz) IBW/kg (Calculated) : 43.2 Heparin Dosing Weight: 54.4 kg  Vital Signs: Temp: 97.6 F (36.4 C) (08/19 1947) Temp Source: Oral (08/19 1947) BP: 158/52 (08/19 2320) Pulse Rate: 75 (08/19 2320)  Labs: Recent Labs    02/08/22 0321 02/08/22 0836 02/08/22 1037 02/09/22 0407 02/10/22 0253 02/10/22 2241  HGB 7.8*  --  7.9* 7.8* 8.7*  --   HCT 24.7*  --   --  24.0* 27.3*  --   PLT 224  --   --  218 260  --   HEPARINUNFRC  --   --   --   --   --  0.47  CREATININE 1.09*  --   --  1.02* 0.96  --   TROPONINIHS  --  462* 421*  --   --   --      Estimated Creatinine Clearance: 36.4 mL/min (by C-G formula based on SCr of 0.96 mg/dL).   Assessment: 81 yo F in AF requiring heparin infusion.  Heparin level therapeutic (0.47) on infusion at 750 units/hr. No bleeding noted.  Goal of Therapy:  Heparin level 0.3-0.7 units/ml Monitor platelets by anticoagulation protocol: Yes   Plan:  Continue heparin at 750 units/hr F/u daily heparin level and CBC  96, PharmD, BCPS Please see amion for complete clinical pharmacist phone list 02/10/2022 11:48 PM

## 2022-02-10 NOTE — Progress Notes (Signed)
Patient placed back on BIPAP at this time due to SOB/WOB.

## 2022-02-10 NOTE — Progress Notes (Signed)
Progress Note  Patient Name: NELSY MADONNA Date of Encounter: 02/10/2022  [facility] Cardiologist:  [facility]  Subjective   Not doing well. Back on BiPAP for respiratory distress. Good UO and renal function still OK. Was in sinus rhythm overnight, back in AFib (rate controlled) since around 12 noon.  Inpatient Medications    Scheduled Meds:  aspirin  81 mg Oral Daily   atorvastatin  80 mg Oral QPM   clopidogrel  75 mg Oral Q breakfast   empagliflozin  10 mg Oral Daily   enoxaparin (LOVENOX) injection  40 mg Subcutaneous Q24H   furosemide  80 mg Intravenous BID   metoprolol succinate  25 mg Oral Daily   potassium chloride  40 mEq Oral Daily   sodium chloride flush  3 mL Intravenous Q12H   sodium chloride flush  3 mL Intravenous Q12H   sodium chloride flush  3 mL Intravenous Q12H   Continuous Infusions:  sodium chloride     PRN Meds: sodium chloride, acetaminophen **OR** acetaminophen, acetaminophen, albuterol, ALPRAZolam, nitroGLYCERIN, ondansetron (ZOFRAN) IV, sodium chloride flush   Vital Signs    Vitals:   02/10/22 0815 02/10/22 1146 02/10/22 1204 02/10/22 1226  BP: (!) 129/43  (!) 166/61 (!) 166/61  Pulse: 69 81 86 94  Resp: 14   (!) 34  Temp:      TempSrc:      SpO2: 96% 99%  91%  Weight:      Height:        Intake/Output Summary (Last 24 hours) at 02/10/2022 1314 Last data filed at 02/10/2022 1031 Gross per 24 hour  Intake 360 ml  Output 1850 ml  Net -1490 ml      02/09/2022    7:00 AM 02/07/2022   12:39 AM 02/06/2022    7:54 PM  Last 3 Weights  Weight (lbs) 129 lb 3 oz 125 lb 3.5 oz 122 lb 5.7 oz  Weight (kg) 58.6 kg 56.8 kg 55.5 kg      Telemetry    AFib, rate controlled since 12 noon - Personally Reviewed  ECG    No new - Personally Reviewed  Physical Exam  Sleeping, bipap on GEN: No acute distress.   Neck: No JVD Cardiac: irregular, no murmurs, rubs, or gallops.  Respiratory: bilateral rales. GI: Soft, nontender, non-distended   MS: No edema; No deformity. Neuro:  Nonfocal  Psych: Normal affect   Labs    High Sensitivity Troponin:   Recent Labs  Lab 02/06/22 0629 02/06/22 0933 02/06/22 1052 02/08/22 0836 02/08/22 1037  TROPONINIHS 145* 621* 833* 462* 421*     Chemistry Recent Labs  Lab 02/06/22 0404 02/06/22 0457 02/08/22 0321 02/09/22 0407 02/10/22 0253  NA 134*   < > 133* 135 136  K 3.7   < > 3.7 3.3* 4.4  CL 89*   < > 91* 92* 91*  CO2 32   < > 32 32 34*  GLUCOSE 272*   < > 120* 146* 110*  BUN 26*   < > 34* 34* 28*  CREATININE 1.09*   < > 1.09* 1.02* 0.96  CALCIUM 9.2   < > 8.6* 8.8* 9.2  MG  --   --   --  2.4  --   PROT 6.1*  --   --  5.3*  --   ALBUMIN 3.5  --   --  2.9*  --   AST 35  --   --  18  --   ALT  23  --   --  18  --   ALKPHOS 136*  --   --  84  --   BILITOT 1.1  --   --  1.0  --   GFRNONAA 51*   < > 51* 56* 60*  ANIONGAP 13   < > 10 11 11    < > = values in this interval not displayed.    Lipids No results for input(s): "CHOL", "TRIG", "HDL", "LABVLDL", "LDLCALC", "CHOLHDL" in the last 168 hours.  Hematology Recent Labs  Lab 02/08/22 0321 02/08/22 1037 02/09/22 0407 02/10/22 0253  WBC 10.2  --  10.5 11.7*  RBC 2.64*  2.62*  --  2.60* 2.91*  HGB 7.8* 7.9* 7.8* 8.7*  HCT 24.7*  --  24.0* 27.3*  MCV 93.6  --  92.3 93.8  MCH 29.5  --  30.0 29.9  MCHC 31.6  --  32.5 31.9  RDW 15.4  --  15.7* 15.6*  PLT 224  --  218 260   Thyroid No results for input(s): "TSH", "FREET4" in the last 168 hours.  BNP Recent Labs  Lab 02/06/22 0404  BNP 2,726.8*    DDimer No results for input(s): "DDIMER" in the last 168 hours.   Radiology    DG CHEST PORT 1 VIEW  Result Date: 02/09/2022 CLINICAL DATA:  Hypoxia. EXAM: PORTABLE CHEST 1 VIEW COMPARISON:  02/06/2022 FINDINGS: Examination demonstrates adequate lung volumes with persistent hazy perihilar and bibasilar opacification likely interstitial edema which is unchanged to slightly worse. Stable moderate opacification of the  left base likely moderate effusion with associated atelectasis. Slight worsening hazy opacification right base likely effusion with atelectasis. Infection in the mid to lower lungs is possible. Mild stable cardiomegaly. Remainder of the exam is unchanged. IMPRESSION: 1. Stable to slightly worse bilateral perihilar and bibasilar opacification likely interstitial edema with bilateral effusions/atelectasis. Infection in the mid to lower lungs is possible. 2. Stable cardiomegaly. Electronically Signed   By: 02/08/2022 M.D.   On: 02/09/2022 08:29    Cardiac Studies   Left heart cath 02/07/22: Conclusion    Ost LM to Ost LAD lesion is 95% stenosed.   Prox LAD to Mid LAD lesion is 100% stenosed.   Mid LAD lesion is 100% stenosed.   Mid RCA lesion is 50% stenosed.   Ost RCA lesion is 80% stenosed.   Origin lesion is 100% stenosed.   Prox Cx lesion is 80% stenosed.   Post intervention, there is a 0% residual stenosis.   1.  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 2.  Underexpanded ostial right coronary artery stent due to surrounding calcium with a pre-Shockwave lithotripsy area of 3.72 mm and a post shockwave area of 6.56 mm; no additional stents were implanted. 3.  LVEDP of 20 mmHg.   Commendation: Dual antiplatelet therapy for at least 1 year.         Diagnostic Dominance: Right  Intervention         Patient Profile     81 y.o. female with a hx of CAD s/p CABG in 2018, ICM , CKD-3, HLD, DM-2, VF arrest, GERD who is being seen 02/06/2022 for the evaluation of acute CHF with ICM at the request of Dr. 02/08/2022.  Assessment & Plan    CHF: will repeat a limited echo. Was hoping for improved MR after RCA revasc. Agree w metolazone. BP is high, will add IV NTG. On Jardiance.  May have  to consider palliative care approach if she fails to respond to medical therapy. CAD: s/p ostial RCA revasc. Parox AFib: just recurred, her respiratory status was  already declining before arrhythmia onset. Start IV heparin. HGb appears to be stable     For questions or updates, please contact [place] Please consult [email or web address] for contact info under        Signed, Sanda Klein, MD  02/10/2022, 1:14 PM

## 2022-02-10 NOTE — Progress Notes (Signed)
Patient trialed off BIPAP at this time. Patient tolerating 4L [place] well and in no distress at this time.

## 2022-02-10 NOTE — Progress Notes (Signed)
PROGRESS NOTE    Lindsey Pollard  [id number] DOB: [birth year] DOA: 02/06/2022 PCP: [name], MD  81/F with history of CAD, CABG, ischemic cardiomyopathy, CKD 3 AAA, type 2 diabetes mellitus, recent hospitalizations with NSTEMI, V-fib arrest, ventilator dependent respiratory failure followed by frequent admissions with CHF Back in the ED with worsening dyspnea X 2 days, respiratory distress placed on BiPAP, BNP was 2726, troponin up to 833, chest x-ray noted moderate to large bilateral pleural effusions, interstitial edema. -Cath 8/16 with significant disease in the left system, patent LIMA-LAD; ISR in the ostial RCA treated with shockwave lithotripsy, no additional stents -Recurrent flash pulmonary edema 8/18  Subjective: -Chest pain/pressure this morning  Assessment and Plan:  Acute on chronic respiratory failure with hypoxia  Acute on chronic systolic CHF -Back on BiPAP since yesterday -Concern for progressive CAD causing recurrent pulmonary edema had RCA occlusion treated with shockwave lithotripsy 8/16 -Last echo 7/23 with EF 45% -Repeat x-ray with persistent bilateral effusions, interstitial edema -Now on Lasix 80 Mg twice daily, continue his only 1 L negative thus far, add metolazone X1 -Continue metoprolol and Jardiance  NSTEMI CAD/CABG and history of stent V-fib arrest -Multiple recent admissions with same, troponin peaked at 833, on heparin and nitro gtt. -Cards following, left heart cath 8/16 with significant left-sided disease, patent LIMA/LAD, ISR in the ostial RCA treated with shockwave lithotripsy, recommended aspirin Plavix for 12 months  -Continue metoprolol and statin   Controlled diabetes mellitus type 2 without long-term use of insulin, with hyperglycemia -Last hemoglobin A1c 6.8 on 12/06/2021.   -Continue Jardiance   Anemia of chronic kidney disease -Hemoglobin down to 7.8 this morning,, recent baseline has been in the 8.5-9 range -Off IV heparin,  denies overt bleeding, anemia panel with severe iron deficiency -Continue IV iron, hemoglobin is 7.8, will transfuse if Hb trends down further  CKD stage IIIa Patient creatinine 1.09 which appears around patient's baseline. -Continue to monitor with diuresis   Hyperlipidemia -Continue atorvastatin   DVT prophylaxis: Add Lovenox Code Status: Was DNR last admission, will wish to be full code now, discussed with patient on multiple occasions Family Communication: No family at bedside, called and updated daughter yesterday evening Disposition Plan: SNF when stable  Consultants:  Cardiology   Procedures: Cath 8/16 with significant disease in the left system, patent LIMA-LAD; ISR in the ostial RCA treated with shockwave lithotripsy, no additional stents  Antimicrobials:    Objective: Vitals:   02/10/22 0436 02/10/22 0545 02/10/22 0635 02/10/22 0815  BP: (!) 166/48 (!) 178/70 (!) 129/43 (!) 129/43  Pulse: 70 84 (!) 59 69  Resp: 16 (!) 30 18 14   Temp: (!) 96.1 F (35.6 C)     TempSrc: Oral     SpO2: 97% 97% 98% 96%  Weight:      Height:        Intake/Output Summary (Last 24 hours) at 02/10/2022 1124 Last data filed at 02/10/2022 1031 Gross per 24 hour  Intake 360 ml  Output 2500 ml  Net -2140 ml   Filed Weights   02/06/22 1954 02/07/22 0039 02/09/22 0700  Weight: 55.5 kg 56.8 kg 58.6 kg    Examination:  General exam: Elderly frail chronically ill female sitting up in bed, AAOx3, uncomfortable appearing HEENT: Positive JVD CVS: S1-S2, irregularly irregular rhythm Lungs: Bilateral Rales noted Abdomen: Soft, nontender, bowel sounds present Extremities: No edema Psychiatry:  Mood & affect appropriate.     Data Reviewed:   CBC: Recent Labs  Lab  02/06/22 0404 02/06/22 0457 02/06/22 0948 02/07/22 0658 02/08/22 0321 02/08/22 1037 02/09/22 0407 02/10/22 0253  WBC 16.3*  --   --  12.8* 10.2  --  10.5 11.7*  NEUTROABS 13.7*  --   --   --   --   --   --   --    HGB 9.6*   < > 11.2* 8.2* 7.8* 7.9* 7.8* 8.7*  HCT 29.9*   < > 33.0* 25.5* 24.7*  --  24.0* 27.3*  MCV 92.9  --   --  92.4 93.6  --  92.3 93.8  PLT 336  --   --  231 224  --  218 260   < > = values in this interval not displayed.   Basic Metabolic Panel: Recent Labs  Lab 02/06/22 0404 02/06/22 0457 02/06/22 0458 02/06/22 0948 02/07/22 0658 02/08/22 0321 02/09/22 0407 02/10/22 0253  NA 134* 131*   < > 131* 132* 133* 135 136  K 3.7 3.7   < > 4.1 4.0 3.7 3.3* 4.4  CL 89* 87*  --   --  89* 91* 92* 91*  CO2 32  --   --   --  34* 32 32 34*  GLUCOSE 272* 276*  --   --  150* 120* 146* 110*  BUN 26* 29*  --   --  35* 34* 34* 28*  CREATININE 1.09* 1.10*  --   --  1.02* 1.09* 1.02* 0.96  CALCIUM 9.2  --   --   --  8.9 8.6* 8.8* 9.2  MG  --   --   --   --   --   --  2.4  --    < > = values in this interval not displayed.   GFR: Estimated Creatinine Clearance: 36.4 mL/min (by C-G formula based on SCr of 0.96 mg/dL). Liver Function Tests: Recent Labs  Lab 02/06/22 0404 02/09/22 0407  AST 35 18  ALT 23 18  ALKPHOS 136* 84  BILITOT 1.1 1.0  PROT 6.1* 5.3*  ALBUMIN 3.5 2.9*   No results for input(s): "LIPASE", "AMYLASE" in the last 168 hours. No results for input(s): "AMMONIA" in the last 168 hours. Coagulation Profile: No results for input(s): "INR", "PROTIME" in the last 168 hours. Cardiac Enzymes: No results for input(s): "CKTOTAL", "CKMB", "CKMBINDEX", "TROPONINI" in the last 168 hours. BNP (last 3 results) No results for input(s): "PROBNP" in the last 8760 hours. HbA1C: No results for input(s): "HGBA1C" in the last 72 hours. CBG: Recent Labs  Lab 02/06/22 2117 02/07/22 0630 02/07/22 1812 02/07/22 2240 02/08/22 0637  GLUCAP 159* 169* 158* 119* 196*   Lipid Profile: No results for input(s): "CHOL", "HDL", "LDLCALC", "TRIG", "CHOLHDL", "LDLDIRECT" in the last 72 hours. Thyroid Function Tests: No results for input(s): "TSH", "T4TOTAL", "FREET4", "T3FREE",  "THYROIDAB" in the last 72 hours. Anemia Panel: Recent Labs    02/08/22 0321  VITAMINB12 380  FOLATE 16.4  FERRITIN 40  TIBC 403  IRON 23*  RETICCTPCT 2.4   Urine analysis:    Component Value Date/Time   COLORURINE YELLOW 02/06/2022 1100   APPEARANCEUR CLEAR 02/06/2022 1100   LABSPEC 1.009 02/06/2022 1100   PHURINE 5.0 02/06/2022 1100   GLUCOSEU >=500 (A) 02/06/2022 1100   HGBUR NEGATIVE 02/06/2022 1100   BILIRUBINUR NEGATIVE 02/06/2022 1100   KETONESUR NEGATIVE 02/06/2022 1100   PROTEINUR NEGATIVE 02/06/2022 1100   NITRITE NEGATIVE 02/06/2022 1100   LEUKOCYTESUR TRACE (A) 02/06/2022 1100   Sepsis Labs: @LABRCNTIP (procalcitonin:4,lacticidven:4)  )  Recent Results (from the past 240 hour(s))  Blood culture (routine x 2)     Status: None (Preliminary result)   Collection Time: 02/06/22  4:04 AM   Specimen: BLOOD  Result Value Ref Range Status   Specimen Description BLOOD SITE NOT SPECIFIED  Final   Special Requests   Final    BOTTLES DRAWN AEROBIC AND ANAEROBIC Blood Culture results may not be optimal due to an excessive volume of blood received in culture bottles   Culture   Final    NO GROWTH 4 DAYS Performed at [facility], [address], [place], [place] [address]    Report Status PENDING  Incomplete  Blood culture (routine x 2)     Status: None (Preliminary result)   Collection Time: 02/06/22  4:18 AM   Specimen: BLOOD  Result Value Ref Range Status   Specimen Description BLOOD SITE NOT SPECIFIED  Final   Special Requests   Final    BOTTLES DRAWN AEROBIC AND ANAEROBIC Blood Culture results may not be optimal due to an excessive volume of blood received in culture bottles   Culture   Final    NO GROWTH 4 DAYS Performed at [facility], [address], [place], [place] [address]    Report Status PENDING  Incomplete  SARS Coronavirus 2 by RT PCR (hospital order, performed in [facility] hospital lab) *cepheid single result test*     Status: None    Collection Time: 02/06/22  5:16 AM   Specimen: Nasal Swab  Result Value Ref Range Status   SARS Coronavirus 2 by RT PCR NEGATIVE NEGATIVE Final    Comment: (NOTE) SARS-CoV-2 target nucleic acids are NOT DETECTED.  The SARS-CoV-2 RNA is generally detectable in upper and lower respiratory specimens during the acute phase of infection. The lowest concentration of SARS-CoV-2 viral copies this assay can detect is 250 copies / mL. A negative result does not preclude SARS-CoV-2 infection and should not be used as the sole basis for treatment or other patient management decisions.  A negative result may occur with improper specimen collection / handling, submission of specimen other than nasopharyngeal swab, presence of viral mutation(s) within the areas targeted by this assay, and inadequate number of viral copies (<250 copies / mL). A negative result must be combined with clinical observations, patient history, and epidemiological information.  Fact Sheet for Patients:   RoadLapTop.co.za  Fact Sheet for Healthcare Providers: http://kim-miller.com/  This test is not yet approved or  cleared by the [place] FDA and has been authorized for detection and/or diagnosis of SARS-CoV-2 by FDA under an Emergency Use Authorization (EUA).  This EUA will remain in effect (meaning this test can be used) for the duration of the COVID-19 declaration under Section 564(b)(1) of the Act, 21 U.S.C. section 360bbb-3(b)(1), unless the authorization is terminated or revoked sooner.  Performed at [facility], [address] [place], [place] [address]   Urine Culture     Status: Abnormal   Collection Time: 02/06/22  8:29 AM   Specimen: Urine, Clean Catch  Result Value Ref Range Status   Specimen Description URINE, CLEAN CATCH  Final   Special Requests NONE  Final   Culture (A)  Final    <10,000 COLONIES/mL INSIGNIFICANT GROWTH Performed at [facility], [address] [place], [place] [address]    Report Status 02/07/2022 FINAL  Final     Radiology Studies: DG CHEST PORT 1 VIEW  Result Date: 02/09/2022 CLINICAL DATA:  Hypoxia. EXAM: PORTABLE CHEST 1 VIEW COMPARISON:  02/06/2022 FINDINGS: Examination demonstrates adequate lung volumes with persistent hazy perihilar and bibasilar opacification likely interstitial edema which is unchanged to slightly worse. Stable moderate opacification of the left base likely moderate effusion with associated atelectasis. Slight worsening hazy opacification right base likely effusion with atelectasis. Infection in the mid to lower lungs is possible. Mild stable cardiomegaly. Remainder of the exam is unchanged. IMPRESSION: 1. Stable to slightly worse bilateral perihilar and bibasilar opacification likely interstitial edema with bilateral effusions/atelectasis. Infection in the mid to lower lungs is possible. 2. Stable cardiomegaly. Electronically Signed   By: Elberta Fortis M.D.   On: 02/09/2022 08:29     Scheduled Meds:  aspirin  81 mg Oral Daily   atorvastatin  80 mg Oral QPM   clopidogrel  75 mg Oral Q breakfast   empagliflozin  10 mg Oral Daily   enoxaparin (LOVENOX) injection  40 mg Subcutaneous Q24H   furosemide  80 mg Intravenous BID   metolazone  5 mg Oral Once   metoprolol succinate  25 mg Oral Daily   potassium chloride  40 mEq Oral Daily   sodium chloride flush  3 mL Intravenous Q12H   sodium chloride flush  3 mL Intravenous Q12H   sodium chloride flush  3 mL Intravenous Q12H   Continuous Infusions:  sodium chloride       LOS: 4 days    Time spent:  Zannie Cove, MD [facility]   02/10/2022, 11:24 AM

## 2022-02-10 NOTE — Progress Notes (Signed)
Pt complains of shortness of breath. Pt placed back on Bipap and 80 mg IV Lasix given per MD's order. Will continue to monitor pt.

## 2022-02-10 NOTE — Progress Notes (Signed)
ANTICOAGULATION CONSULT NOTE  Pharmacy Consult for Heparin Indication: atrial fibrillation  Allergies  Allergen Reactions   Tetanus Toxoids Swelling    Tetanus and diphtheria toxids   Penicillin G Rash    Patient Measurements: Height: 4\' 11"  (149.9 cm) Weight: 58.6 kg (129 lb 3 oz) IBW/kg (Calculated) : 43.2 Heparin Dosing Weight: 54.4 kg  Vital Signs: Temp: 96.1 F (35.6 C) (08/19 0436) Temp Source: Oral (08/19 0436) BP: 166/61 (08/19 1226) Pulse Rate: 94 (08/19 1226)  Labs: Recent Labs    02/08/22 0321 02/08/22 0836 02/08/22 1037 02/09/22 0407 02/10/22 0253  HGB 7.8*  --  7.9* 7.8* 8.7*  HCT 24.7*  --   --  24.0* 27.3*  PLT 224  --   --  218 260  CREATININE 1.09*  --   --  1.02* 0.96  TROPONINIHS  --  462* 421*  --   --      Estimated Creatinine Clearance: 36.4 mL/min (by C-G formula based on SCr of 0.96 mg/dL).   Medications:  Scheduled:   aspirin  81 mg Oral Daily   atorvastatin  80 mg Oral QPM   clopidogrel  75 mg Oral Q breakfast   empagliflozin  10 mg Oral Daily   furosemide  80 mg Intravenous BID   heparin  2,700 Units Intravenous Once   metoprolol succinate  25 mg Oral Daily   potassium chloride  40 mEq Oral Daily   sodium chloride flush  3 mL Intravenous Q12H   sodium chloride flush  3 mL Intravenous Q12H   sodium chloride flush  3 mL Intravenous Q12H   Infusions:   sodium chloride     heparin     nitroGLYCERIN      Assessment: 81 yo F in AF requiring heparin infusion  Goal of Therapy:  Heparin level 0.3-0.7 units/ml Monitor platelets by anticoagulation protocol: Yes   Plan:  Bolus 2700 units X1 Start heparin at 750 units/hr Follow-up HL at 2100 Daily CBC / HL Monitor for signs of bleeding and issues with the infusion  Naia Ruff BS, PharmD, BCPS Clinical Pharmacist 02/10/2022 1:43 PM  Contact: [phone] after 3 PM  "Be curious, not judgmental..." -[phone]

## 2022-02-11 DIAGNOSIS — I5043 Acute on chronic combined systolic (congestive) and diastolic (congestive) heart failure: Secondary | ICD-10-CM | POA: Diagnosis not present

## 2022-02-11 LAB — CULTURE, BLOOD (ROUTINE X 2)
Culture: NO GROWTH
Culture: NO GROWTH

## 2022-02-11 LAB — CBC
HCT: 24.4 % — ABNORMAL LOW (ref 36.0–46.0)
Hemoglobin: 8 g/dL — ABNORMAL LOW (ref 12.0–15.0)
MCH: 30.2 pg (ref 26.0–34.0)
MCHC: 32.8 g/dL (ref 30.0–36.0)
MCV: 92.1 fL (ref 80.0–100.0)
Platelets: 227 10*3/uL (ref 150–400)
RBC: 2.65 MIL/uL — ABNORMAL LOW (ref 3.87–5.11)
RDW: 15.7 % — ABNORMAL HIGH (ref 11.5–15.5)
WBC: 13.3 10*3/uL — ABNORMAL HIGH (ref 4.0–10.5)
nRBC: 0 % (ref 0.0–0.2)

## 2022-02-11 LAB — BASIC METABOLIC PANEL
Anion gap: 11 (ref 5–15)
BUN: 30 mg/dL — ABNORMAL HIGH (ref 8–23)
CO2: 31 mmol/L (ref 22–32)
Calcium: 8.8 mg/dL — ABNORMAL LOW (ref 8.9–10.3)
Chloride: 89 mmol/L — ABNORMAL LOW (ref 98–111)
Creatinine, Ser: 0.88 mg/dL (ref 0.44–1.00)
GFR, Estimated: >60 mL/min (ref >60)
Glucose, Bld: 124 mg/dL — ABNORMAL HIGH (ref 70–99)
Potassium: 3.9 mmol/L (ref 3.5–5.1)
Sodium: 131 mmol/L — ABNORMAL LOW (ref 135–145)

## 2022-02-11 LAB — HEPARIN LEVEL (UNFRACTIONATED): Heparin Unfractionated: 0.36 IU/mL (ref 0.30–0.70)

## 2022-02-11 MED ORDER — HEPARIN SODIUM (PORCINE) 5000 UNIT/ML IJ SOLN
5000.0000 [IU] | Freq: Three times a day (TID) | INTRAMUSCULAR | Status: DC
  Administered 2022-02-11 – 2022-02-15 (×12): 5000 [IU] via SUBCUTANEOUS
  Filled 2022-02-11 (×12): qty 1

## 2022-02-11 NOTE — TOC Progression Note (Signed)
Transition of Care [facility]) - Progression Note    Patient Details  Name: SHAQUELA WEICHERT MRN: [id number] Date of Birth: 1941-06-04  [facility] CM/SW Contact  Patrice Paradise, LCSW Phone Number: 02/11/2022, 2:39 PM  Clinical Narrative:     CSW noted pt on EDD board for tomorrow and was going to start authorization however noted that PT has not seen pt seen since 02/08/2022. Pt will need PT notes within the last 48 hours to begin authorization. MD alerted.  TOC team will continue to assist with discharge planning needs.   Expected Discharge Plan: Skilled Nursing Facility Barriers to Discharge: Continued Medical Work up  Expected Discharge Plan and Services Expected Discharge Plan: Skilled Nursing Facility In-house Referral: Clinical Social Work Discharge Planning Services: CM Consult   Living arrangements for the past 2 months:  (recently at [facility] short term lives at home with friend)                   DME Agency: NA       HH Arranged: NA           Social Determinants of Health (SDOH) Interventions Food Insecurity Interventions: Intervention Not Indicated Financial Strain Interventions: Intervention Not Indicated Housing Interventions: Intervention Not Indicated Transportation Interventions: Intervention Not Indicated  Readmission Risk Interventions    02/07/2022   11:31 AM 01/18/2022    9:04 PM 01/05/2022    5:09 PM  Readmission Risk Prevention Plan  Transportation Screening Complete Complete Complete  PCP or Specialist Appt within 3-5 Days   Complete  Social Work Consult for Recovery Care Planning/Counseling   Not Complete  SW consult not completed comments   NA  Palliative Care Screening   Not Applicable  Medication Review [employer]) Complete Referral to Pharmacy Referral to Pharmacy  PCP or Specialist appointment within 3-5 days of discharge  Complete   HRI or Home Care Consult Complete Complete   SW Recovery Care/Counseling Consult  Complete Not Complete   SW Consult Not Complete Comments  NA   Palliative Care Screening Not Applicable Not Applicable   Skilled Nursing Facility Complete Complete

## 2022-02-11 NOTE — Progress Notes (Addendum)
PROGRESS NOTE    Lindsey Pollard  MWU:[id number] DOB: [birth year] DOA: 02/06/2022 PCP: Lauro Regulus, MD  81/F with history of CAD, CABG, ischemic cardiomyopathy, CKD 3 A, type 2 diabetes mellitus, recent hospitalization with NSTEMI, V-fib arrest, ventilator dependent respiratory failure followed by frequent admissions with CHF Back in the ED with worsening dyspnea X 2 days, respiratory distress placed on BiPAP, BNP was 2726, troponin up to 833, chest x-ray noted moderate to large bilateral pleural effusions, interstitial edema. -Cath 8/16 with significant disease in the left system, patent LIMA-LAD; ISR in the ostial RCA treated with shockwave lithotripsy, no additional stents -Recurrent flash pulmonary edema 8/18  Subjective: -Breathing improving, stayed on BiPAP most of the day yesterday  Assessment and Plan:  Acute on chronic respiratory failure with hypoxia  Acute on chronic systolic CHF -Back on BiPAP since yesterday -Concern for progressive CAD causing recurrent pulmonary edema had RCA restenosis treated with shockwave lithotripsy 8/16 -Last echo 7/23 with EF 45% -Repeat x-ray with persistent bilateral effusions, interstitial edema -Continue IV Lasix 80 Mg twice daily, she is 3.6 L negative, starting Entresto -Continue metoprolol and Jardiance -I think she needs goals of care and CODE STATUS discussion again, will request palliative input  NSTEMI CAD/CABG and history of stent V-fib arrest -Multiple recent admissions with same, troponin peaked at 833, on heparin and nitro gtt. -Cards following, left heart cath 8/16 with significant left-sided disease, patent LIMA/LAD, ISR in the ostial RCA treated with shockwave lithotripsy, recommended aspirin Plavix for 12 months  -Continue metoprolol and statin   Controlled diabetes mellitus type 2 without long-term use of insulin, with hyperglycemia -Last hemoglobin A1c 6.8 on 12/06/2021.   -Continue Jardiance   Anemia of chronic  kidney disease -Hemoglobin down to 7.8 this morning,, recent baseline has been in the 8.5-9 range -Off IV heparin, denies overt bleeding, anemia panel with severe iron deficiency -Continue IV iron, hemoglobin is 8  Mild leukocytosis -She is afebrile, could be reactive, no overt signs or symptoms of infection -Check CBC in a.m., check Pro-Cal  CKD stage IIIa -Stable, monitor with diuresis   Hyperlipidemia -Continue atorvastatin   DVT prophylaxis: Lovenox Code Status: Was DNR last admission, will wish to be full code now, discussed with patient on multiple occasions Family Communication: No family at bedside, called and updated daughter yesterday Disposition Plan: SNF when stable  Consultants:  Cardiology   Procedures: Cath 8/16 with significant disease in the left system, patent LIMA-LAD; ISR in the ostial RCA treated with shockwave lithotripsy, no additional stents  Antimicrobials:    Objective: Vitals:   02/11/22 0430 02/11/22 0530 02/11/22 0842 02/11/22 1000  BP: (!) 106/93 (!) 122/42 (!) 125/43 (!) 141/65  Pulse: 71 68 65 78  Resp: 19  18   Temp: 97.6 F (36.4 C)     TempSrc: Axillary     SpO2: 98% 100% 100%   Weight:      Height:        Intake/Output Summary (Last 24 hours) at 02/11/2022 1130 Last data filed at 02/11/2022 0056 Gross per 24 hour  Intake 242.43 ml  Output 2100 ml  Net -1857.57 ml   Filed Weights   02/06/22 1954 02/07/22 0039 02/09/22 0700  Weight: 55.5 kg 56.8 kg 58.6 kg    Examination:  General exam: Elderly frail chronically ill female sitting up in bed, AAOx3, more comfortable today HEENT: Positive JVD CVS: S1-S2, irregularly irregular rhythm Lungs: Few bilateral rales Abdomen: Soft, nontender, bowel sounds present Extremities: No  edema Psychiatry:  Mood & affect appropriate.     Data Reviewed:   CBC: Recent Labs  Lab 02/06/22 0404 02/06/22 0457 02/07/22 [id number] 02/08/22 0321 02/08/22 1037 02/09/22 0407 02/10/22 0253  02/11/22 0427  WBC 16.3*  --  12.8* 10.2  --  10.5 11.7* 13.3*  NEUTROABS 13.7*  --   --   --   --   --   --   --   HGB 9.6*   < > 8.2* 7.8* 7.9* 7.8* 8.7* 8.0*  HCT 29.9*   < > 25.5* 24.7*  --  24.0* 27.3* 24.4*  MCV 92.9  --  92.4 93.6  --  92.3 93.8 92.1  PLT 336  --  231 224  --  218 260 227   < > = values in this interval not displayed.   Basic Metabolic Panel: Recent Labs  Lab 02/07/22 0658 02/08/22 0321 02/09/22 0407 02/10/22 0253 02/11/22 0427  NA 132* 133* 135 136 131*  K 4.0 3.7 3.3* 4.4 3.9  CL 89* 91* 92* 91* 89*  CO2 34* 32 32 34* 31  GLUCOSE 150* 120* 146* 110* 124*  BUN 35* 34* 34* 28* 30*  CREATININE 1.02* 1.09* 1.02* 0.96 0.88  CALCIUM 8.9 8.6* 8.8* 9.2 8.8*  MG  --   --  2.4  --   --    GFR: Estimated Creatinine Clearance: 39.8 mL/min (by C-G formula based on SCr of 0.88 mg/dL). Liver Function Tests: Recent Labs  Lab 02/06/22 0404 02/09/22 0407  AST 35 18  ALT 23 18  ALKPHOS 136* 84  BILITOT 1.1 1.0  PROT 6.1* 5.3*  ALBUMIN 3.5 2.9*   No results for input(s): "LIPASE", "AMYLASE" in the last 168 hours. No results for input(s): "AMMONIA" in the last 168 hours. Coagulation Profile: No results for input(s): "INR", "PROTIME" in the last 168 hours. Cardiac Enzymes: No results for input(s): "CKTOTAL", "CKMB", "CKMBINDEX", "TROPONINI" in the last 168 hours. BNP (last 3 results) No results for input(s): "PROBNP" in the last 8760 hours. HbA1C: No results for input(s): "HGBA1C" in the last 72 hours. CBG: Recent Labs  Lab 02/06/22 2117 02/07/22 0630 02/07/22 1812 02/07/22 2240 02/08/22 0637  GLUCAP 159* 169* 158* 119* 196*   Lipid Profile: No results for input(s): "CHOL", "HDL", "LDLCALC", "TRIG", "CHOLHDL", "LDLDIRECT" in the last 72 hours. Thyroid Function Tests: No results for input(s): "TSH", "T4TOTAL", "FREET4", "T3FREE", "THYROIDAB" in the last 72 hours. Anemia Panel: No results for input(s): "VITAMINB12", "FOLATE", "FERRITIN", "TIBC",  "IRON", "RETICCTPCT" in the last 72 hours.  Urine analysis:    Component Value Date/Time   COLORURINE YELLOW 02/06/2022 1100   APPEARANCEUR CLEAR 02/06/2022 1100   LABSPEC 1.009 02/06/2022 1100   PHURINE 5.0 02/06/2022 1100   GLUCOSEU >=500 (A) 02/06/2022 1100   HGBUR NEGATIVE 02/06/2022 1100   BILIRUBINUR NEGATIVE 02/06/2022 1100   KETONESUR NEGATIVE 02/06/2022 1100   PROTEINUR NEGATIVE 02/06/2022 1100   NITRITE NEGATIVE 02/06/2022 1100   LEUKOCYTESUR TRACE (A) 02/06/2022 1100   Sepsis Labs: @LABRCNTIP (procalcitonin:4,lacticidven:4)  ) Recent Results (from the past 240 hour(s))  Blood culture (routine x 2)     Status: None   Collection Time: 02/06/22  4:04 AM   Specimen: BLOOD  Result Value Ref Range Status   Specimen Description BLOOD SITE NOT SPECIFIED  Final   Special Requests   Final    BOTTLES DRAWN AEROBIC AND ANAEROBIC Blood Culture results may not be optimal due to an excessive volume of blood received in culture bottles  Culture   Final    NO GROWTH 5 DAYS Performed at [facility], [address] [place], [place] [address]    Report Status 02/11/2022 FINAL  Final  Blood culture (routine x 2)     Status: None   Collection Time: 02/06/22  4:18 AM   Specimen: BLOOD  Result Value Ref Range Status   Specimen Description BLOOD SITE NOT SPECIFIED  Final   Special Requests   Final    BOTTLES DRAWN AEROBIC AND ANAEROBIC Blood Culture results may not be optimal due to an excessive volume of blood received in culture bottles   Culture   Final    NO GROWTH 5 DAYS Performed at [facility], [address], [place], [place] [address]    Report Status 02/11/2022 FINAL  Final  SARS Coronavirus 2 by RT PCR (hospital order, performed in [facility] hospital lab) *cepheid single result test*     Status: None   Collection Time: 02/06/22  5:16 AM   Specimen: Nasal Swab  Result Value Ref Range Status   SARS Coronavirus 2 by RT PCR NEGATIVE NEGATIVE Final     Comment: (NOTE) SARS-CoV-2 target nucleic acids are NOT DETECTED.  The SARS-CoV-2 RNA is generally detectable in upper and lower respiratory specimens during the acute phase of infection. The lowest concentration of SARS-CoV-2 viral copies this assay can detect is 250 copies / mL. A negative result does not preclude SARS-CoV-2 infection and should not be used as the sole basis for treatment or other patient management decisions.  A negative result may occur with improper specimen collection / handling, submission of specimen other than nasopharyngeal swab, presence of viral mutation(s) within the areas targeted by this assay, and inadequate number of viral copies (<250 copies / mL). A negative result must be combined with clinical observations, patient history, and epidemiological information.  Fact Sheet for Patients:   RoadLapTop.co.za  Fact Sheet for Healthcare Providers: http://kim-miller.com/  This test is not yet approved or  cleared by the [place] FDA and has been authorized for detection and/or diagnosis of SARS-CoV-2 by FDA under an Emergency Use Authorization (EUA).  This EUA will remain in effect (meaning this test can be used) for the duration of the COVID-19 declaration under Section 564(b)(1) of the Act, 21 U.S.C. section 360bbb-3(b)(1), unless the authorization is terminated or revoked sooner.  Performed at [facility], [address], [place], [place] [address]   Urine Culture     Status: Abnormal   Collection Time: 02/06/22  8:29 AM   Specimen: Urine, Clean Catch  Result Value Ref Range Status   Specimen Description URINE, CLEAN CATCH  Final   Special Requests NONE  Final   Culture (A)  Final    <10,000 COLONIES/mL INSIGNIFICANT GROWTH Performed at [facility], [address], [place], [place] [address]    Report Status 02/07/2022 FINAL  Final     Radiology Studies: No results  found.   Scheduled Meds:  aspirin  81 mg Oral Daily   atorvastatin  80 mg Oral QPM   clopidogrel  75 mg Oral Q breakfast   empagliflozin  10 mg Oral Daily   furosemide  80 mg Intravenous BID   heparin injection (subcutaneous)  5,000 Units Subcutaneous Q8H   metoprolol succinate  25 mg Oral Daily   sodium chloride flush  3 mL Intravenous Q12H   sodium chloride flush  3 mL Intravenous Q12H   sodium chloride  flush  3 mL Intravenous Q12H   Continuous Infusions:  sodium chloride     nitroGLYCERIN 10 mcg/min (02/11/22 0709)     LOS: 5 days    Time spent: 31min  Domenic Polite, MD [facility]   02/11/2022, 11:30 AM

## 2022-02-11 NOTE — Progress Notes (Addendum)
ANTICOAGULATION CONSULT NOTE  Pharmacy Consult for Heparin Indication: atrial fibrillation  Allergies  Allergen Reactions   Tetanus Toxoids Swelling    Tetanus and diphtheria toxids   Penicillin G Rash    Patient Measurements: Height: 4\' 11"  (149.9 cm) Weight: 58.6 kg (129 lb 3 oz) IBW/kg (Calculated) : 43.2 Heparin Dosing Weight: 54.4 kg  Vital Signs: Temp: 97.6 F (36.4 C) (08/20 0430) Temp Source: Axillary (08/20 0430) BP: 141/65 (08/20 1000) Pulse Rate: 78 (08/20 1000)  Labs: Recent Labs    02/08/22 1037 02/09/22 0407 02/10/22 0253 02/10/22 2241 02/11/22 0427  HGB 7.9* 7.8* 8.7*  --  8.0*  HCT  --  24.0* 27.3*  --  24.4*  PLT  --  218 260  --  227  HEPARINUNFRC  --   --   --  0.47 0.36  CREATININE  --  1.02* 0.96  --  0.88  TROPONINIHS 421*  --   --   --   --      Estimated Creatinine Clearance: 39.8 mL/min (by C-G formula based on SCr of 0.88 mg/dL).   Assessment: 81 yo F in AF requiring heparin infusion.  Heparin level therapeutic (0.36) on infusion at 750 units/hr. No issues with infusion or signs of bleeding per RN. CBC stable.   Goal of Therapy:  Heparin level 0.3-0.7 units/ml Monitor platelets by anticoagulation protocol: Yes   Plan:  Continue heparin at 750 units/hr F/u daily heparin level and CBC Monitor for signs of bleeding and issues with the infusion  ADDENDUM @1100  AM: Stop heparin per MD.   96, PharmD Pharmacy Resident  02/11/2022 10:18 AM

## 2022-02-11 NOTE — Progress Notes (Signed)
Pt resting comfortably off bipap at this time. Spo2 98% on 3L nasal cannula. No distress noted. RT will continue to monitor and be available as needed.

## 2022-02-11 NOTE — Progress Notes (Signed)
Progress Note  Patient Name: Lindsey Pollard Date of Encounter: 02/11/2022  [facility] Cardiologist: [facility]  Subjective   Doing better.  Off BiPAP.  Back in normal sinus rhythm. Diuresis almost 3 L, net -2.6 L overnight, -3.6 L since admission.  Inpatient Medications    Scheduled Meds:  aspirin  81 mg Oral Daily   atorvastatin  80 mg Oral QPM   clopidogrel  75 mg Oral Q breakfast   empagliflozin  10 mg Oral Daily   furosemide  80 mg Intravenous BID   metoprolol succinate  25 mg Oral Daily   sodium chloride flush  3 mL Intravenous Q12H   sodium chloride flush  3 mL Intravenous Q12H   sodium chloride flush  3 mL Intravenous Q12H   Continuous Infusions:  sodium chloride     heparin 750 Units/hr (02/10/22 1544)   nitroGLYCERIN 10 mcg/min (02/11/22 0709)   PRN Meds: sodium chloride, acetaminophen **OR** acetaminophen, acetaminophen, albuterol, ALPRAZolam, nitroGLYCERIN, ondansetron (ZOFRAN) IV, sodium chloride flush   Vital Signs    Vitals:   02/11/22 0430 02/11/22 0530 02/11/22 0842 02/11/22 1000  BP: (!) 106/93 (!) 122/42 (!) 125/43 (!) 141/65  Pulse: 71 68 65 78  Resp: 19  18   Temp: 97.6 F (36.4 C)     TempSrc: Axillary     SpO2: 98% 100% 100%   Weight:      Height:        Intake/Output Summary (Last 24 hours) at 02/11/2022 1048 Last data filed at 02/11/2022 0056 Gross per 24 hour  Intake 242.43 ml  Output 2100 ml  Net -1857.57 ml      02/09/2022    7:00 AM 02/07/2022   12:39 AM 02/06/2022    7:54 PM  Last 3 Weights  Weight (lbs) 129 lb 3 oz 125 lb 3.5 oz 122 lb 5.7 oz  Weight (kg) 58.6 kg 56.8 kg 55.5 kg      Telemetry    Normal sinus rhythm- Personally Reviewed  ECG    No new tracing- Personally Reviewed  Physical Exam  Appears much more comfortable GEN: No acute distress.   Neck: No JVD Cardiac: RRR, apical holosystolic murmur, no diastolic murmurs, rubs, or gallops.  Respiratory: Clear to auscultation bilaterally. GI: Soft,  nontender, non-distended  MS: No edema; No deformity. Neuro:  Nonfocal  Psych: Normal affect   Labs    High Sensitivity Troponin:   Recent Labs  Lab 02/06/22 0629 02/06/22 0933 02/06/22 1052 02/08/22 0836 02/08/22 1037  TROPONINIHS 145* 621* 833* 462* 421*     Chemistry Recent Labs  Lab 02/06/22 0404 02/06/22 0457 02/09/22 0407 02/10/22 0253 02/11/22 0427  NA 134*   < > 135 136 131*  K 3.7   < > 3.3* 4.4 3.9  CL 89*   < > 92* 91* 89*  CO2 32   < > 32 34* 31  GLUCOSE 272*   < > 146* 110* 124*  BUN 26*   < > 34* 28* 30*  CREATININE 1.09*   < > 1.02* 0.96 0.88  CALCIUM 9.2   < > 8.8* 9.2 8.8*  MG  --   --  2.4  --   --   PROT 6.1*  --  5.3*  --   --   ALBUMIN 3.5  --  2.9*  --   --   AST 35  --  18  --   --   ALT 23  --  18  --   --  ALKPHOS 136*  --  84  --   --   BILITOT 1.1  --  1.0  --   --   GFRNONAA 51*   < > 56* 60* >60  ANIONGAP 13   < > 11 11 11    < > = values in this interval not displayed.    Lipids No results for input(s): "CHOL", "TRIG", "HDL", "LABVLDL", "LDLCALC", "CHOLHDL" in the last 168 hours.  Hematology Recent Labs  Lab 02/09/22 0407 02/10/22 0253 02/11/22 0427  WBC 10.5 11.7* 13.3*  RBC 2.60* 2.91* 2.65*  HGB 7.8* 8.7* 8.0*  HCT 24.0* 27.3* 24.4*  MCV 92.3 93.8 92.1  MCH 30.0 29.9 30.2  MCHC 32.5 31.9 32.8  RDW 15.7* 15.6* 15.7*  PLT 218 260 227   Thyroid No results for input(s): "TSH", "FREET4" in the last 168 hours.  BNP Recent Labs  Lab 02/06/22 0404  BNP 2,726.8*    DDimer No results for input(s): "DDIMER" in the last 168 hours.   Radiology    No results found.  Cardiac Studies   Left heart cath 02/07/22: Conclusion    Ost LM to Ost LAD lesion is 95% stenosed.   Prox LAD to Mid LAD lesion is 100% stenosed.   Mid LAD lesion is 100% stenosed.   Mid RCA lesion is 50% stenosed.   Ost RCA lesion is 80% stenosed.   Origin lesion is 100% stenosed.   Prox Cx lesion is 80% stenosed.   Post intervention, there is a 0%  residual stenosis.   1.  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 2.  Underexpanded ostial right coronary artery stent due to surrounding calcium with a pre-Shockwave lithotripsy area of 3.72 mm and a post shockwave area of 6.56 mm; no additional stents were implanted. 3.  LVEDP of 20 mmHg.   Commendation: Dual antiplatelet therapy for at least 1 year.         Diagnostic Dominance: Right  Intervention         Patient Profile     81 y.o. female with a hx of CAD s/p CABG in 2018, ICM , CKD-3, HLD, DM-2, VF arrest, GERD admitted with NSTEMI, recurrent episodes of acute decompensation of chronic by systolic and diastolic heart failure, paroxysmal atrial fibrillation, mitral insufficiency underwent revascularization for proximal right coronary artery restenosis 02/07/2022 (angioplasty/shockwave lithotripsy, no new stents)  Assessment & Plan    CHF: Seems to be recovering from yet another episode of acute pulmonary edema.  We will keep on IV nitroglycerin for another 24 hours and stop it tomorrow.  On SGLT2 inhibitor.  On beta-blocker.  We will try to start [facility].  Stop nitroglycerin if blood pressure becomes too low.  Otherwise, we will keep on IV nitroglycerin for another 24 hours and stop it tomorrow. CAD: In the last 2 months she has had 5 hospitalizations with non-STEMI and heart failure exacerbation, including 1 complicated by VF arrest.  Now s/p repeat revascularization of right coronary artery stent restenosis, without new stent placement.  Hopefully this will stop the cycle of decompensation.  Theoretically could stop her antiplatelet agents after 30 days, although with her complex coronary anatomy would prefer to keep on clopidogrel long-term if there are no bleeding complications. MR: At least moderate mitral insufficiency.  May have ischemic mechanism.  Hopefully will improve following her last revascularization and this will lead to  better heart failure compensation. Paroxysmal atrial fibrillation: We will stop heparin.  Reluctant to use triple anticoagulant in this elderly frail patient with moderate to severe anemia.  Consider adding Eliquis after we stop her aspirin in 30 days.     For questions or updates, please contact [facility] Please consult [email or web address] for contact info under        Signed, Thurmon Fair, MD  02/11/2022, 10:48 AM

## 2022-02-12 ENCOUNTER — Ambulatory Visit: Payer: Medicare Other | Admitting: Family

## 2022-02-12 DIAGNOSIS — I5043 Acute on chronic combined systolic (congestive) and diastolic (congestive) heart failure: Secondary | ICD-10-CM | POA: Diagnosis not present

## 2022-02-12 DIAGNOSIS — N1831 Chronic kidney disease, stage 3a: Secondary | ICD-10-CM | POA: Diagnosis not present

## 2022-02-12 DIAGNOSIS — Z7189 Other specified counseling: Secondary | ICD-10-CM

## 2022-02-12 DIAGNOSIS — I25110 Atherosclerotic heart disease of native coronary artery with unstable angina pectoris: Secondary | ICD-10-CM | POA: Diagnosis not present

## 2022-02-12 DIAGNOSIS — Z515 Encounter for palliative care: Secondary | ICD-10-CM

## 2022-02-12 DIAGNOSIS — J9621 Acute and chronic respiratory failure with hypoxia: Secondary | ICD-10-CM | POA: Diagnosis not present

## 2022-02-12 LAB — CBC
HCT: 27.2 % — ABNORMAL LOW (ref 36.0–46.0)
Hemoglobin: 8.6 g/dL — ABNORMAL LOW (ref 12.0–15.0)
MCH: 29.4 pg (ref 26.0–34.0)
MCHC: 31.6 g/dL (ref 30.0–36.0)
MCV: 92.8 fL (ref 80.0–100.0)
Platelets: 249 10*3/uL (ref 150–400)
RBC: 2.93 MIL/uL — ABNORMAL LOW (ref 3.87–5.11)
RDW: 15.9 % — ABNORMAL HIGH (ref 11.5–15.5)
WBC: 9.5 10*3/uL (ref 4.0–10.5)
nRBC: 0 % (ref 0.0–0.2)

## 2022-02-12 LAB — BASIC METABOLIC PANEL
Anion gap: 12 (ref 5–15)
BUN: 27 mg/dL — ABNORMAL HIGH (ref 8–23)
CO2: 37 mmol/L — ABNORMAL HIGH (ref 22–32)
Calcium: 9.3 mg/dL (ref 8.9–10.3)
Chloride: 84 mmol/L — ABNORMAL LOW (ref 98–111)
Creatinine, Ser: 1.1 mg/dL — ABNORMAL HIGH (ref 0.44–1.00)
GFR, Estimated: 51 mL/min — ABNORMAL LOW (ref >60)
Glucose, Bld: 172 mg/dL — ABNORMAL HIGH (ref 70–99)
Potassium: 3.6 mmol/L (ref 3.5–5.1)
Sodium: 133 mmol/L — ABNORMAL LOW (ref 135–145)

## 2022-02-12 LAB — PROCALCITONIN: Procalcitonin: <0.1 ng/mL

## 2022-02-12 MED ORDER — POTASSIUM CHLORIDE CRYS ER 20 MEQ PO TBCR
40.0000 meq | EXTENDED_RELEASE_TABLET | Freq: Two times a day (BID) | ORAL | Status: AC
Start: 2022-02-12 — End: 2022-02-12
  Administered 2022-02-12 (×2): 40 meq via ORAL
  Filled 2022-02-12 (×2): qty 2

## 2022-02-12 MED ORDER — SPIRONOLACTONE 12.5 MG HALF TABLET
12.5000 mg | ORAL_TABLET | Freq: Every day | ORAL | Status: DC
  Administered 2022-02-12: 12.5 mg via ORAL
  Filled 2022-02-12: qty 1

## 2022-02-12 MED ORDER — MELATONIN 5 MG PO TABS
10.0000 mg | ORAL_TABLET | Freq: Every evening | ORAL | Status: DC | PRN
  Administered 2022-02-12 (×2): 10 mg via ORAL
  Filled 2022-02-12 (×2): qty 2

## 2022-02-12 MED ORDER — CYCLOBENZAPRINE HCL 10 MG PO TABS
5.0000 mg | ORAL_TABLET | Freq: Three times a day (TID) | ORAL | Status: DC | PRN
  Administered 2022-02-12 – 2022-02-14 (×4): 5 mg via ORAL
  Filled 2022-02-12 (×4): qty 1

## 2022-02-12 MED ORDER — METOLAZONE 5 MG PO TABS
5.0000 mg | ORAL_TABLET | Freq: Once | ORAL | Status: AC
  Administered 2022-02-12: 5 mg via ORAL
  Filled 2022-02-12: qty 1

## 2022-02-12 NOTE — Progress Notes (Addendum)
Progress Note  Patient Name: Lindsey Pollard Date of Encounter: 02/12/2022  [facility] Cardiologist: None   Subjective   Patient had to take her oxygen off this AM after she got a bloody nose, now back on [place] but feels like she is having a hard time catching her breath. Has some mild chest heaviness, has been improving with diuresis   Inpatient Medications    Scheduled Meds:  aspirin  81 mg Oral Daily   atorvastatin  80 mg Oral QPM   clopidogrel  75 mg Oral Q breakfast   empagliflozin  10 mg Oral Daily   furosemide  80 mg Intravenous BID   heparin injection (subcutaneous)  5,000 Units Subcutaneous Q8H   metoprolol succinate  25 mg Oral Daily   sodium chloride flush  3 mL Intravenous Q12H   sodium chloride flush  3 mL Intravenous Q12H   sodium chloride flush  3 mL Intravenous Q12H   Continuous Infusions:  sodium chloride     nitroGLYCERIN 10 mcg/min (02/11/22 1724)   PRN Meds: sodium chloride, acetaminophen **OR** acetaminophen, acetaminophen, albuterol, ALPRAZolam, melatonin, nitroGLYCERIN, ondansetron (ZOFRAN) IV, sodium chloride flush   Vital Signs    Vitals:   02/11/22 2003 02/11/22 2349 02/12/22 0023 02/12/22 0519  BP: (!) 135/47 (!) 124/97 (!) 137/96 (!) 138/53  Pulse: 69 70 69 76  Resp: 18 (!) 28 20   Temp: 98.2 F (36.8 C)  97.6 F (36.4 C) 97.8 F (36.6 C)  TempSrc: Oral  Oral Axillary  SpO2: 100% 98% 93% 100%  Weight:      Height:        Intake/Output Summary (Last 24 hours) at 02/12/2022 0717 Last data filed at 02/12/2022 0600 Gross per 24 hour  Intake 244.51 ml  Output 1300 ml  Net -1055.49 ml      02/09/2022    7:00 AM 02/07/2022   12:39 AM 02/06/2022    7:54 PM  Last 3 Weights  Weight (lbs) 129 lb 3 oz 125 lb 3.5 oz 122 lb 5.7 oz  Weight (kg) 58.6 kg 56.8 kg 55.5 kg      Telemetry    Sinus rhythm, PVCs, ventricular bigeminy. HR in the 70s - Personally Reviewed  ECG    No new tracings since 02/09/2022 - Personally Reviewed  Physical  Exam   GEN: No acute distress. Sitting upright in the bed. Increased WOB on   Neck: No JVD Cardiac: RRR, grade 2/6 systolic murmur throughout pericardium. Radial pulses 2+ bilaterally  Respiratory: Crackles in bilateral lung bases  GI: Soft, nontender, non-distended  MS: Trace edema; No deformity. Neuro:  Nonfocal  Psych: Normal affect   Labs    High Sensitivity Troponin:   Recent Labs  Lab 02/06/22 0629 02/06/22 0933 02/06/22 1052 02/08/22 0836 02/08/22 1037  TROPONINIHS 145* 621* 833* 462* 421*     Chemistry Recent Labs  Lab 02/06/22 0404 02/06/22 0457 02/09/22 0407 02/10/22 0253 02/11/22 0427  NA 134*   < > 135 136 131*  K 3.7   < > 3.3* 4.4 3.9  CL 89*   < > 92* 91* 89*  CO2 32   < > 32 34* 31  GLUCOSE 272*   < > 146* 110* 124*  BUN 26*   < > 34* 28* 30*  CREATININE 1.09*   < > 1.02* 0.96 0.88  CALCIUM 9.2   < > 8.8* 9.2 8.8*  MG  --   --  2.4  --   --  PROT 6.1*  --  5.3*  --   --   ALBUMIN 3.5  --  2.9*  --   --   AST 35  --  18  --   --   ALT 23  --  18  --   --   ALKPHOS 136*  --  84  --   --   BILITOT 1.1  --  1.0  --   --   GFRNONAA 51*   < > 56* 60* >60  ANIONGAP 13   < > 11 11 11    < > = values in this interval not displayed.    Lipids No results for input(s): "CHOL", "TRIG", "HDL", "LABVLDL", "LDLCALC", "CHOLHDL" in the last 168 hours.  Hematology Recent Labs  Lab 02/09/22 0407 02/10/22 0253 02/11/22 0427  WBC 10.5 11.7* 13.3*  RBC 2.60* 2.91* 2.65*  HGB 7.8* 8.7* 8.0*  HCT 24.0* 27.3* 24.4*  MCV 92.3 93.8 92.1  MCH 30.0 29.9 30.2  MCHC 32.5 31.9 32.8  RDW 15.7* 15.6* 15.7*  PLT 218 260 227   Thyroid No results for input(s): "TSH", "FREET4" in the last 168 hours.  BNP Recent Labs  Lab 02/06/22 0404  BNP 2,726.8*    DDimer No results for input(s): "DDIMER" in the last 168 hours.   Radiology    No results found.  Cardiac Studies   Echocardiogram 01/12/2022   1. Left ventricular ejection fraction, by estimation, is 45 to  50%. The  left ventricle has mildly decreased function. The left ventricle  demonstrates global hypokinesis. The left ventricular internal cavity size  was mildly dilated. Left ventricular  diastolic function could not be evaluated.   2. Right ventricular systolic function is normal.   3. Moderate pleural effusion in the left lateral region.   4. The mitral valve is normal in structure.   5. The aortic valve is normal in structure. Aortic valve regurgitation is  not visualized.   Conclusion(s)/Recommendation(s): Poor windows for evaluation of left  ventricular function by transthoracic echocardiography. Would recommend an alternative means of evaluation.   Left Heart Catheterization 02/07/2022      Ost LM to Ost LAD lesion is 95% stenosed.   Prox LAD to Mid LAD lesion is 100% stenosed.   Mid LAD lesion is 100% stenosed.   Mid RCA lesion is 50% stenosed.   Ost RCA lesion is 80% stenosed.   Origin lesion is 100% stenosed.   Prox Cx lesion is 80% stenosed.   Post intervention, there is a 0% residual stenosis.   1.  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 2.  Underexpanded ostial right coronary artery stent due to surrounding calcium with a pre-Shockwave lithotripsy area of 3.72 mm and a post shockwave area of 6.56 mm; no additional stents were implanted. 3.  LVEDP of 20 mmHg.   Commendation: Dual antiplatelet therapy for at least 1 year. Diagnostic Dominance: Right  Intervention    Patient Profile     81 y.o. female with a hx of CAD s/p CABG in 2018, ICM , CKD-3, HLD, DM-2, VF arrest, GERD admitted with NSTEMI, recurrent episodes of acute decompensation of chronic by systolic and diastolic heart failure, paroxysmal atrial fibrillation, mitral insufficiency underwent revascularization for proximal right coronary artery restenosis 02/07/2022 (angioplasty/shockwave lithotripsy, no new stents)  Assessment & Plan    Acute on Chronic  Systolic Heart Failure  - Patient presented on 8/15 with SOB, orthopnea. BNP elevated to 2700.  CXR With moderate-lage bilateral pleural effusions with bibasilar collapse or consolidation. Note, this is her 5th admission this summer with acute CHF/NSTEMI, one admission was complicated by VF arrest  - She underwent LHC on 8/16 that showed patent LIMA to LAD, high-grade severe native vessel disease of the left system, RIMA-RCA was occluded, ostial RCA lesion was 80% stenosed. Noted that ostial RCA stent was underexpanded due to surrounding calcium. Underwent shockwave lithotripsy  - Patient was on IV diureses. CXR 8/18 showed stable to slightly worse bilateral perihilar and bibasilar opacification, patient's respiratory status worsened and she required bi-pap. Concern that patient developed flash pulmonary edema in the setting of high BP - She was started on IV nitro-- wean today  - Now has been diuresing on IV lasix 80 mg BID- output at least 1.3 L urine yesterday and is currently net -4.7 L since admission  - BNP pending this AM. Will need to follow renal function  - Continue jardiance 10 mg daily, metoprolol 25 mg daily - If renal function stable, start entresto today  - Similarly, if renal function stable, resume home spiro 12.5 mg daily  - Agree with palliative care consultation given patient's frequent admissions in the past few months  - Patient continues to have crackles in lungs, SOB. Question if patient would benefit from thoracentesis given bilateral effusions.   HTN - Patient has been on IV nitro- wean today  - Continue metoprolol 25 mg daily  - Start entresto and resume home spiro today, pending renal function   PVCs  - Per telemetry, patient is in NSR with frequent PVCs  - On metoprolol  - Maintain K >4 (BMP pending today ) - Mag 2.4 on 8/18  CAD  - LHC this admission as above  - Continue DAPT with ASA, clopidgrel for at least 1 year (theoretically could stop after 30 days, but  patient has complex coronary anatomy and would at least be on clopidogrel long term) - Continue metoprolol, lipitor       For questions or updates, please contact [facility] Please consult [email or web address] for contact info under        Signed, Jonita Albee, PA-C  02/12/2022, 7:17 AM    Patient seen and examined with KJ PA-C.  Agree as above, with the following exceptions and changes as noted below.  Sleeping when I arrived, easily awakens.  Notes a right leg cramp which is secondary to diuresis. Gen: NAD, CV: RRR, Lungs: clear, Abd: soft, Extrem: Warm, well perfused, no edema, Neuro/Psych: alert and oriented x 3, normal mood and affect. All available labs, radiology testing, previous records reviewed.   I have independently reviewed the echocardiogram obtained 01/12/2022.  There is degenerative mitral and aortic valve disease.  Mitral valve has at least moderate if not greater mitral annular calcification with at least moderate mitral valve regurgitation.  Aortic valve appears calcified with aortic valve regurgitation best seen in the parasternal long axis view.  Though incompletely interrogated, does appear mild-moderate to moderate.  Note the patient has a wide pulse pressure across serial echoes and a marked wide pulse pressure on exam today.  I suspect her aortic valve regurgitation is underestimated on recent echoes.  A diastolic murmur is not clearly appreciated on exam.  Agree with prior consult team that her decompensations are likely contributed to by valvular heart disease which may worsen in severity due to an underlying ischemic mechanism.  She has had multiple admissions recently for heart failure and palliative care has  been consulted.  She is deemed well enough to travel, she hopes to stay with her daughter in [place].  We will defer those decisions to palliative care.  From a cardiovascular perspective would continue diuresis today, renal function did slightly worsen compared  to yesterday but is overall stable.  Follow closely with diuresis. Elouise Munroe, MD 02/12/22 1:40 PM

## 2022-02-12 NOTE — Progress Notes (Signed)
PT Cancellation Note  Patient Details Name: Lindsey Pollard MRN: [id number] DOB: 1940-12-16   Cancelled Treatment:    Reason Eval/Treat Not Completed: Fatigue limiting ability to participate. Pt reports she just got to [facility] and back and is now too fatigued. Will follow up in AM.   Angelina Ok [facility] 02/12/2022, 6:15 PM Skip Mayer PT Acute Rehabilitation Services Office [phone]

## 2022-02-12 NOTE — Progress Notes (Addendum)
PROGRESS NOTE    Lindsey Pollard  ZES:[id number] DOB: January 16, 1941 DOA: 02/06/2022 PCP: Lauro Regulus, MD  81/F with history of CAD, CABG, ischemic cardiomyopathy, CKD 3 A, type 2 diabetes mellitus, recent hospitalization with NSTEMI, V-fib arrest, ventilator dependent respiratory failure followed by frequent admissions with CHF Back in the ED with worsening dyspnea X 2 days, respiratory distress placed on BiPAP, BNP was 2726, troponin up to 833, chest x-ray noted moderate to large bilateral pleural effusions, interstitial edema. -Cath 8/16 with significant disease in the left system, patent LIMA-LAD; ISR in the ostial RCA treated with shockwave lithotripsy, no additional stents -Recurrent flash pulmonary edema 8/18  Subjective: -A little better, breathing is starting to improve, used BiPAP last night for a few hours  Assessment and Plan:  Acute on chronic respiratory failure with hypoxia  Acute on chronic systolic CHF -Required BiPAP intermittently, now nightly -Concern for progressive CAD causing recurrent pulmonary edema had RCA restenosis treated with shockwave lithotripsy 8/16 -Last echo 7/23 with EF 45% -Repeat x-ray with persistent bilateral effusions, interstitial edema -Continue IV Lasix 80 Mg twice daily, she is 4.6 L negative, repeat metolazone, add Aldactone -Continue metoprolol and Jardiance -I think she needs goals of care and CODE STATUS discussion again, frequent admissions for CHF   NSTEMI CAD/CABG and history of stent V-fib arrest -Multiple recent admissions with same, troponin peaked at 833, on heparin and nitro gtt. -Cards following, left heart cath 8/16 with significant left-sided disease, patent LIMA/LAD, ISR in the ostial RCA treated with shockwave lithotripsy, recommended aspirin Plavix for 12 months  -Continue metoprolol and statin   Controlled diabetes mellitus type 2 without long-term use of insulin, with hyperglycemia -Last hemoglobin A1c 6.8 on  12/06/2021.   -Continue Jardiance   Anemia of chronic kidney disease -Hemoglobin down to 7.8 this morning,, recent baseline has been in the 8.5-9 range -Off IV heparin, denies overt bleeding, anemia panel with severe iron deficiency -Continue IV iron, hemoglobin is 8  Mild leukocytosis -She is afebrile, could be reactive, no overt signs or symptoms of infection -WBC improving  CKD stage IIIa -Stable, monitor with diuresis   Hyperlipidemia -Continue atorvastatin   DVT prophylaxis: Lovenox Code Status: Was DNR last admission, wishes to be full code now, discussed with patient on multiple occasions Family Communication: No family at bedside, will call and update daughter Disposition Plan: SNF when stable  Consultants:  Cardiology   Procedures: Cath 8/16 with significant disease in the left system, patent LIMA-LAD; ISR in the ostial RCA treated with shockwave lithotripsy, no additional stents  Antimicrobials:    Objective: Vitals:   02/11/22 2349 02/12/22 0023 02/12/22 0519 02/12/22 0949  BP: (!) 124/97 (!) 137/96 (!) 138/53 (!) 133/40  Pulse: 70 69 76 82  Resp: (!) 28 20    Temp:  97.6 F (36.4 C) 97.8 F (36.6 C)   TempSrc:  Oral Axillary   SpO2: 98% 93% 100%   Weight:      Height:        Intake/Output Summary (Last 24 hours) at 02/12/2022 1156 Last data filed at 02/12/2022 1100 Gross per 24 hour  Intake 244.51 ml  Output 2050 ml  Net -1805.49 ml   Filed Weights   02/06/22 1954 02/07/22 0039 02/09/22 0700  Weight: 55.5 kg 56.8 kg 58.6 kg    Examination:  General exam: Elderly chronically ill female sitting up in bed, HEENT: Positive JVD CVS: S1-S2, irregularly irregular rhythm Lungs: Few basilar rales Abdomen: Soft, nontender, bowel sounds  present Extremities: No edema  Psychiatry:  Mood & affect appropriate.   Data Reviewed:   CBC: Recent Labs  Lab 02/06/22 0404 02/06/22 0457 02/08/22 0321 02/08/22 1037 02/09/22 0407 02/10/22 0253  02/11/22 0427 02/12/22 0928  WBC 16.3*   < > 10.2  --  10.5 11.7* 13.3* 9.5  NEUTROABS 13.7*  --   --   --   --   --   --   --   HGB 9.6*   < > 7.8* 7.9* 7.8* 8.7* 8.0* 8.6*  HCT 29.9*   < > 24.7*  --  24.0* 27.3* 24.4* 27.2*  MCV 92.9   < > 93.6  --  92.3 93.8 92.1 92.8  PLT 336   < > 224  --  218 260 227 249   < > = values in this interval not displayed.   Basic Metabolic Panel: Recent Labs  Lab 02/08/22 0321 02/09/22 0407 02/10/22 0253 02/11/22 0427 02/12/22 0928  NA 133* 135 136 131* 133*  K 3.7 3.3* 4.4 3.9 3.6  CL 91* 92* 91* 89* 84*  CO2 32 32 34* 31 37*  GLUCOSE 120* 146* 110* 124* 172*  BUN 34* 34* 28* 30* 27*  CREATININE 1.09* 1.02* 0.96 0.88 1.10*  CALCIUM 8.6* 8.8* 9.2 8.8* 9.3  MG  --  2.4  --   --   --    GFR: Estimated Creatinine Clearance: 31.8 mL/min (A) (by C-G formula based on SCr of 1.1 mg/dL (H)). Liver Function Tests: Recent Labs  Lab 02/06/22 0404 02/09/22 0407  AST 35 18  ALT 23 18  ALKPHOS 136* 84  BILITOT 1.1 1.0  PROT 6.1* 5.3*  ALBUMIN 3.5 2.9*   No results for input(s): "LIPASE", "AMYLASE" in the last 168 hours. No results for input(s): "AMMONIA" in the last 168 hours. Coagulation Profile: No results for input(s): "INR", "PROTIME" in the last 168 hours. Cardiac Enzymes: No results for input(s): "CKTOTAL", "CKMB", "CKMBINDEX", "TROPONINI" in the last 168 hours. BNP (last 3 results) No results for input(s): "PROBNP" in the last 8760 hours. HbA1C: No results for input(s): "HGBA1C" in the last 72 hours. CBG: Recent Labs  Lab 02/06/22 2117 02/07/22 0630 02/07/22 1812 02/07/22 2240 02/08/22 0637  GLUCAP 159* 169* 158* 119* 196*   Lipid Profile: No results for input(s): "CHOL", "HDL", "LDLCALC", "TRIG", "CHOLHDL", "LDLDIRECT" in the last 72 hours. Thyroid Function Tests: No results for input(s): "TSH", "T4TOTAL", "FREET4", "T3FREE", "THYROIDAB" in the last 72 hours. Anemia Panel: No results for input(s): "VITAMINB12", "FOLATE",  "FERRITIN", "TIBC", "IRON", "RETICCTPCT" in the last 72 hours.  Urine analysis:    Component Value Date/Time   COLORURINE YELLOW 02/06/2022 1100   APPEARANCEUR CLEAR 02/06/2022 1100   LABSPEC 1.009 02/06/2022 1100   PHURINE 5.0 02/06/2022 1100   GLUCOSEU >=500 (A) 02/06/2022 1100   HGBUR NEGATIVE 02/06/2022 1100   BILIRUBINUR NEGATIVE 02/06/2022 1100   KETONESUR NEGATIVE 02/06/2022 1100   PROTEINUR NEGATIVE 02/06/2022 1100   NITRITE NEGATIVE 02/06/2022 1100   LEUKOCYTESUR TRACE (A) 02/06/2022 1100   Sepsis Labs: @LABRCNTIP (procalcitonin:4,lacticidven:4)  ) Recent Results (from the past 240 hour(s))  Blood culture (routine x 2)     Status: None   Collection Time: 02/06/22  4:04 AM   Specimen: BLOOD  Result Value Ref Range Status   Specimen Description BLOOD SITE NOT SPECIFIED  Final   Special Requests   Final    BOTTLES DRAWN AEROBIC AND ANAEROBIC Blood Culture results may not be optimal due to an excessive  volume of blood received in culture bottles   Culture   Final    NO GROWTH 5 DAYS Performed at [facility], [place] [address] [place], [place] [address]    Report Status 02/11/2022 FINAL  Final  Blood culture (routine x 2)     Status: None   Collection Time: 02/06/22  4:18 AM   Specimen: BLOOD  Result Value Ref Range Status   Specimen Description BLOOD SITE NOT SPECIFIED  Final   Special Requests   Final    BOTTLES DRAWN AEROBIC AND ANAEROBIC Blood Culture results may not be optimal due to an excessive volume of blood received in culture bottles   Culture   Final    NO GROWTH 5 DAYS Performed at [facility], [place] [address] [place], [place] [address]    Report Status 02/11/2022 FINAL  Final  SARS Coronavirus 2 by RT PCR (hospital order, performed in [facility] hospital lab) *cepheid single result test*     Status: None   Collection Time: 02/06/22  5:16 AM   Specimen: Nasal Swab  Result Value Ref Range Status   SARS Coronavirus 2 by RT PCR NEGATIVE  NEGATIVE Final    Comment: (NOTE) SARS-CoV-2 target nucleic acids are NOT DETECTED.  The SARS-CoV-2 RNA is generally detectable in upper and lower respiratory specimens during the acute phase of infection. The lowest concentration of SARS-CoV-2 viral copies this assay can detect is 250 copies / mL. A negative result does not preclude SARS-CoV-2 infection and should not be used as the sole basis for treatment or other patient management decisions.  A negative result may occur with improper specimen collection / handling, submission of specimen other than nasopharyngeal swab, presence of viral mutation(s) within the areas targeted by this assay, and inadequate number of viral copies (<250 copies / mL). A negative result must be combined with clinical observations, patient history, and epidemiological information.  Fact Sheet for Patients:   https://www.patel.info/  Fact Sheet for Healthcare Providers: https://hall.com/  This test is not yet approved or  cleared by the [place] FDA and has been authorized for detection and/or diagnosis of SARS-CoV-2 by FDA under an Emergency Use Authorization (EUA).  This EUA will remain in effect (meaning this test can be used) for the duration of the COVID-19 declaration under Section 564(b)(1) of the Act, 21 U.S.C. section 360bbb-3(b)(1), unless the authorization is terminated or revoked sooner.  Performed at [facility], [place] [address], [place], [place] [address]   Urine Culture     Status: Abnormal   Collection Time: 02/06/22  8:29 AM   Specimen: Urine, Clean Catch  Result Value Ref Range Status   Specimen Description URINE, CLEAN CATCH  Final   Special Requests NONE  Final   Culture (A)  Final    <10,000 COLONIES/mL INSIGNIFICANT GROWTH Performed at [facility], [place] [address], [place], [place] [address]    Report Status 02/07/2022 FINAL  Final     Radiology Studies: No  results found.   Scheduled Meds:  aspirin  81 mg Oral Daily   atorvastatin  80 mg Oral QPM   clopidogrel  75 mg Oral Q breakfast   empagliflozin  10 mg Oral Daily   furosemide  80 mg Intravenous BID   heparin injection (subcutaneous)  5,000 Units Subcutaneous Q8H   metoprolol succinate  25 mg Oral Daily   sodium chloride flush  3 mL Intravenous Q12H   sodium chloride flush  3 mL Intravenous Q12H   sodium chloride flush  3 mL Intravenous Q12H   Continuous Infusions:  sodium chloride     nitroGLYCERIN 10 mcg/min (02/11/22 1724)     LOS: 6 days    Time spent:  Zannie Cove, MD [facility]   02/12/2022, 11:56 AM

## 2022-02-12 NOTE — Progress Notes (Signed)
Pt. Refused bipap. 
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
## 2022-02-12 NOTE — Progress Notes (Signed)
CARDIAC REHAB PHASE I     Stopped by to see pt. Daughter at bedside. Post stent education including site care, antiplatelet therapy importance,risk factors, restrictions,and CRP2. Pt is being evaluated by palliative care for possible hospice care. Will refer to CRP2 at [facility] and let them know her current status. Pt is unable to ambulate or get up out of bed.will sigh off for now.   [id number]  Woodroe Chen, RN BSN 02/12/2022 11:04 AM

## 2022-02-12 NOTE — Plan of Care (Signed)
  Problem: Activity: Goal: Ability to tolerate increased activity will improve Outcome: Progressing   Problem: Respiratory: Goal: Ability to maintain a clear airway and adequate ventilation will improve Outcome: Progressing   Problem: Role Relationship: Goal: Method of communication will improve Outcome: Progressing   Problem: Education: Goal: Ability to describe self-care measures that may prevent or decrease complications (Diabetes Survival Skills Education) will improve Outcome: Progressing Goal: Individualized Educational Video(s) Outcome: Progressing   Problem: Coping: Goal: Ability to adjust to condition or change in health will improve Outcome: Progressing   Problem: Fluid Volume: Goal: Ability to maintain a balanced intake and output will improve Outcome: Progressing   Problem: Health Behavior/Discharge Planning: Goal: Ability to identify and utilize available resources and services will improve Outcome: Progressing Goal: Ability to manage health-related needs will improve Outcome: Progressing   Problem: Metabolic: Goal: Ability to maintain appropriate glucose levels will improve Outcome: Progressing   Problem: Nutritional: Goal: Maintenance of adequate nutrition will improve Outcome: Progressing Goal: Progress toward achieving an optimal weight will improve Outcome: Progressing   Problem: Skin Integrity: Goal: Risk for impaired skin integrity will decrease Outcome: Progressing   Problem: Tissue Perfusion: Goal: Adequacy of tissue perfusion will improve Outcome: Progressing   Problem: Education: Goal: Understanding of CV disease, CV risk reduction, and recovery process will improve Outcome: Progressing Goal: Individualized Educational Video(s) Outcome: Progressing   Problem: Activity: Goal: Ability to return to baseline activity level will improve Outcome: Progressing   Problem: Cardiovascular: Goal: Ability to achieve and maintain adequate  cardiovascular perfusion will improve Outcome: Progressing Goal: Vascular access site(s) Level 0-1 will be maintained Outcome: Progressing   Problem: Health Behavior/Discharge Planning: Goal: Ability to safely manage health-related needs after discharge will improve Outcome: Progressing   Problem: Education: Goal: Knowledge of General Education information will improve Description: Including pain rating scale, medication(s)/side effects and non-pharmacologic comfort measures Outcome: Progressing   Problem: Health Behavior/Discharge Planning: Goal: Ability to manage health-related needs will improve Outcome: Progressing   Problem: Clinical Measurements: Goal: Ability to maintain clinical measurements within normal limits will improve Outcome: Progressing Goal: Will remain free from infection Outcome: Progressing Goal: Diagnostic test results will improve Outcome: Progressing Goal: Respiratory complications will improve Outcome: Progressing Goal: Cardiovascular complication will be avoided Outcome: Progressing   Problem: Activity: Goal: Risk for activity intolerance will decrease Outcome: Progressing   Problem: Nutrition: Goal: Adequate nutrition will be maintained Outcome: Progressing   Problem: Coping: Goal: Level of anxiety will decrease Outcome: Progressing   Problem: Elimination: Goal: Will not experience complications related to bowel motility Outcome: Progressing Goal: Will not experience complications related to urinary retention Outcome: Progressing   Problem: Pain Managment: Goal: General experience of comfort will improve Outcome: Progressing   Problem: Safety: Goal: Ability to remain free from injury will improve Outcome: Progressing   Problem: Skin Integrity: Goal: Risk for impaired skin integrity will decrease Outcome: Progressing   Problem: Education: Goal: Ability to demonstrate management of disease process will improve Outcome:  Progressing Goal: Ability to verbalize understanding of medication therapies will improve Outcome: Progressing Goal: Individualized Educational Video(s) Outcome: Progressing   Problem: Activity: Goal: Capacity to carry out activities will improve Outcome: Progressing   Problem: Cardiac: Goal: Ability to achieve and maintain adequate cardiopulmonary perfusion will improve Outcome: Progressing

## 2022-02-13 DIAGNOSIS — Z515 Encounter for palliative care: Secondary | ICD-10-CM | POA: Diagnosis not present

## 2022-02-13 DIAGNOSIS — I25110 Atherosclerotic heart disease of native coronary artery with unstable angina pectoris: Secondary | ICD-10-CM | POA: Diagnosis not present

## 2022-02-13 DIAGNOSIS — I5043 Acute on chronic combined systolic (congestive) and diastolic (congestive) heart failure: Secondary | ICD-10-CM | POA: Diagnosis not present

## 2022-02-13 DIAGNOSIS — N1831 Chronic kidney disease, stage 3a: Secondary | ICD-10-CM | POA: Diagnosis not present

## 2022-02-13 LAB — BASIC METABOLIC PANEL
Anion gap: 10 (ref 5–15)
BUN: 32 mg/dL — ABNORMAL HIGH (ref 8–23)
CO2: 37 mmol/L — ABNORMAL HIGH (ref 22–32)
Calcium: 9 mg/dL (ref 8.9–10.3)
Chloride: 85 mmol/L — ABNORMAL LOW (ref 98–111)
Creatinine, Ser: 1.08 mg/dL — ABNORMAL HIGH (ref 0.44–1.00)
GFR, Estimated: 52 mL/min — ABNORMAL LOW (ref >60)
Glucose, Bld: 163 mg/dL — ABNORMAL HIGH (ref 70–99)
Potassium: 3.8 mmol/L (ref 3.5–5.1)
Sodium: 132 mmol/L — ABNORMAL LOW (ref 135–145)

## 2022-02-13 MED ORDER — SPIRONOLACTONE 25 MG PO TABS
25.0000 mg | ORAL_TABLET | Freq: Every day | ORAL | Status: DC
  Administered 2022-02-13 – 2022-02-16 (×4): 25 mg via ORAL
  Filled 2022-02-13 (×4): qty 1

## 2022-02-13 NOTE — Progress Notes (Signed)
Civil engineer, contracting [facility]) Hospital Liaison Note  Referral received for patient/family interest in hospice at home. [facility] liaison reached out to patient's daughter [name] to confirm interest. Interest confirmed.   Hospice eligibility pending.   Plan is to discharge home in 1-2 days.   DME in the home: [facility] and rollator  DME needs: hospital bed, over the bed table, oxygen(2 liters)   Please send patient home with comfort medications/prescriptions at discharge.   Please call with any questions or concerns. Thank you  Dionicio Stall, [facility] Liaison [phone]

## 2022-02-13 NOTE — Progress Notes (Signed)
Physical Therapy Treatment Patient Details Name: Lindsey Pollard MRN: [id number] DOB: 07-May-1941 Today's Date: 02/13/2022   History of Present Illness 81 y.o. female presents to [facility] hospital on 02/06/2022 with progressive SOB. Pt recently admitted 8/7-8/12 with acute on chronic respiratory failure with hypoxia secondary to acute on chronic systolic heart failure, discharged to SNF. Pt again admitted for CHF with hypoxia and hypercapnia. L heart cath 8/16. PMH significant of recent Vfib arrest; chronic systolic CHF; CAD s/p CABG and PCI; stage 3 CKD; and DM.    PT Comments    Pt continues to make slow progress and fatigues quickly. Continue to recommend return to SNF at DC.    Recommendations for follow up therapy are one component of a multi-disciplinary discharge planning process, led by the attending physician.  Recommendations may be updated based on patient status, additional functional criteria and insurance authorization.  Follow Up Recommendations  Skilled nursing-short term rehab (<3 hours/day)     Assistance Recommended at Discharge Frequent or constant Supervision/Assistance  Patient can return home with the following A lot of help with walking and/or transfers;A lot of help with bathing/dressing/bathroom;Assistance with cooking/housework;Assist for transportation;Help with stairs or ramp for entrance   Equipment Recommendations  BSC/3in1    Recommendations for Other Services       Precautions / Restrictions Precautions Precautions: Fall Precaution Comments: monitor vitals closely     Mobility  Bed Mobility Overal bed mobility: Needs Assistance Bed Mobility: Supine to Sit     Supine to sit: Min assist, HOB elevated     General bed mobility comments: Assist to elevate trunk into sitting    Transfers Overall transfer level: Needs assistance Equipment used: Rolling walker (2 wheels), 1 person hand held assist Transfers: Sit to/from Stand, Bed to  chair/wheelchair/BSC Sit to Stand: Min assist   Step pivot transfers: Min assist       General transfer comment: Assist to bring hips up and for balance. rt foot tends to slide out and knee stays hyperextended. Stood initially with walker but pt preferred HHA for bed to bsc to chair.    Ambulation/Gait                   Stairs             Wheelchair Mobility    Modified Rankin (Stroke Patients Only)       Balance Overall balance assessment: Needs assistance Sitting-balance support: No upper extremity supported, Feet supported Sitting balance-Leahy Scale: Fair     Standing balance support: Bilateral upper extremity supported, Single extremity supported Standing balance-Leahy Scale: Poor Standing balance comment: UE support and min assist                            Cognition Arousal/Alertness: Awake/alert Behavior During Therapy: Flat affect Overall Cognitive Status: Within Functional Limits for tasks assessed                                          Exercises      General Comments General comments (skin integrity, edema, etc.): VSS on RA with SpO2 90-92%. Replaced O2 at 2L since SpO2 decr to 88% with pt sleeping      Pertinent Vitals/Pain Pain Assessment Pain Assessment: No/denies pain    Home Living  Prior Function            PT Goals (current goals can now be found in the care plan section) Acute Rehab PT Goals Patient Stated Goal: go to [place] Progress towards PT goals: Progressing toward goals    Frequency    Min 2X/week      PT Plan Current plan remains appropriate    Co-evaluation              AM-PAC PT "6 Clicks" Mobility   Outcome Measure  Help needed turning from your back to your side while in a flat bed without using bedrails?: A Little Help needed moving from lying on your back to sitting on the side of a flat bed without using bedrails?: A  Little Help needed moving to and from a bed to a chair (including a wheelchair)?: A Little Help needed standing up from a chair using your arms (e.g., wheelchair or bedside chair)?: A Little Help needed to walk in hospital room?: Total Help needed climbing 3-5 steps with a railing? : Total 6 Click Score: 14    End of Session   Activity Tolerance: Patient limited by fatigue Patient left: in chair;with call bell/phone within reach;with chair alarm set Nurse Communication: Mobility status PT Visit Diagnosis: Other abnormalities of gait and mobility (R26.89);Muscle weakness (generalized) (M62.81)     Time: 1610-9604 PT Time Calculation (min) (ACUTE ONLY): 30 min  Charges:  $Therapeutic Activity: 23-37 mins                     [facility] PT Acute Rehabilitation Services Office [phone]    Angelina Ok [facility] 02/13/2022, 10:40 AM

## 2022-02-13 NOTE — Progress Notes (Signed)
Palliative Medicine Progress Note   Patient Name: Lindsey Pollard       Date: 02/13/2022 DOB: Feb 25, 1941  Age: 81 y.o. MRN#: [id number] Attending Physician: Zannie Cove, MD Primary Care Physician: Lauro Regulus, MD Admit Date: 02/06/2022  Reason for Consultation/Follow-up: {Reason for Consult:23484}  HPI/Patient Profile: 81 y.o. female  with past medical history of CAD s/p CABG, ischemic cardiomyopathy, chronic systolic CHF, CKD stage IIIa, and type 2 diabetes mellitus.  She was hospitalized at [facility] 7/20 to 8/1 with NSTEMI, V-fib arrest, and respiratory failure requiring intubation.  She was then hospitalized at [facility] 8/7 to 8/12 with acute on chronic CHF and discharged to SNF.  She presented back to [facility] ED on 02/06/2022 with worsening dyspnea x2 days.  She developed respiratory distress and was placed on BiPAP.  BNP was 2726, troponin up to 833, and chest x-ray noted moderate to large bilateral pleural effusions and interstitial edema.  Cardiac cath 8/16 showed significant disease in the left system; underexpanded ostial RCA stent treated with shockwave lithotripsy, no additional stents. Palliative Medicine was asked to discuss goals of care.    Subjective: ***  Objective:  Physical Exam          Vital Signs: BP (!) 151/57   Pulse 78   Temp 98.9 F (37.2 C) (Oral)   Resp 16   Ht 4\' 11"  (1.499 m)   Wt 58.6 kg   SpO2 99%   BMI 26.09 kg/m  SpO2: SpO2: 99 % O2 Device: O2 Device: Nasal Cannula O2 Flow Rate: O2 Flow Rate (L/min): 2 L/min  Intake/output summary:  Intake/Output Summary (Last 24 hours) at 02/13/2022 1343 Last data filed at 02/13/2022 0346 Gross per 24 hour  Intake 27.87 ml  Output 1250 ml  Net -1222.13 ml    LBM: Last BM Date : 02/12/22     Palliative  Assessment/Data: ***     Palliative Medicine Assessment & Plan   Assessment: Principal Problem:   Acute on chronic combined systolic (congestive) and diastolic (congestive) heart failure (HCC) Active Problems:   CAD (coronary artery disease)   Acute on chronic respiratory failure with hypoxia (HCC)   Chronic kidney disease, stage 3a (HCC)   Controlled type 2 diabetes mellitus without complication, without long-term current use of insulin (HCC)   SIRS (systemic inflammatory response syndrome) (HCC)  Elevated troponin   Hyponatremia   Anemia due to chronic kidney disease    Recommendations/Plan: ***  Goals of Care and Additional Recommendations: Limitations on Scope of Treatment: {Recommended Scope and Preferences:21019}  Code Status:   Prognosis:  {Palliative Care Prognosis:23504}  Discharge Planning: {Palliative dispostion:23505}  Care plan was discussed with ***  Thank you for allowing the Palliative Medicine Team to assist in the care of this patient.   ***   Merry Proud, NP   Please contact Palliative Medicine Team phone at [phone] for questions and concerns.  For individual providers, please see AMION.

## 2022-02-13 NOTE — Plan of Care (Signed)
  Problem: Activity: Goal: Ability to tolerate increased activity will improve Outcome: Progressing   Problem: Respiratory: Goal: Ability to maintain a clear airway and adequate ventilation will improve Outcome: Progressing   Problem: Role Relationship: Goal: Method of communication will improve Outcome: Progressing   Problem: Education: Goal: Ability to describe self-care measures that may prevent or decrease complications (Diabetes Survival Skills Education) will improve Outcome: Progressing Goal: Individualized Educational Video(s) Outcome: Progressing   Problem: Coping: Goal: Ability to adjust to condition or change in health will improve Outcome: Progressing   Problem: Fluid Volume: Goal: Ability to maintain a balanced intake and output will improve Outcome: Progressing   Problem: Health Behavior/Discharge Planning: Goal: Ability to identify and utilize available resources and services will improve Outcome: Progressing Goal: Ability to manage health-related needs will improve Outcome: Progressing   Problem: Metabolic: Goal: Ability to maintain appropriate glucose levels will improve Outcome: Progressing   Problem: Nutritional: Goal: Maintenance of adequate nutrition will improve Outcome: Progressing Goal: Progress toward achieving an optimal weight will improve Outcome: Progressing   Problem: Skin Integrity: Goal: Risk for impaired skin integrity will decrease Outcome: Progressing   Problem: Tissue Perfusion: Goal: Adequacy of tissue perfusion will improve Outcome: Progressing   Problem: Education: Goal: Understanding of CV disease, CV risk reduction, and recovery process will improve Outcome: Progressing Goal: Individualized Educational Video(s) Outcome: Progressing   Problem: Activity: Goal: Ability to return to baseline activity level will improve Outcome: Progressing   Problem: Cardiovascular: Goal: Ability to achieve and maintain adequate  cardiovascular perfusion will improve Outcome: Progressing Goal: Vascular access site(s) Level 0-1 will be maintained Outcome: Progressing   Problem: Health Behavior/Discharge Planning: Goal: Ability to safely manage health-related needs after discharge will improve Outcome: Progressing   Problem: Education: Goal: Knowledge of General Education information will improve Description: Including pain rating scale, medication(s)/side effects and non-pharmacologic comfort measures Outcome: Progressing   Problem: Health Behavior/Discharge Planning: Goal: Ability to manage health-related needs will improve Outcome: Progressing   Problem: Clinical Measurements: Goal: Ability to maintain clinical measurements within normal limits will improve Outcome: Progressing Goal: Will remain free from infection Outcome: Progressing Goal: Diagnostic test results will improve Outcome: Progressing Goal: Respiratory complications will improve Outcome: Progressing Goal: Cardiovascular complication will be avoided Outcome: Progressing   Problem: Activity: Goal: Risk for activity intolerance will decrease Outcome: Progressing   Problem: Nutrition: Goal: Adequate nutrition will be maintained Outcome: Progressing   Problem: Coping: Goal: Level of anxiety will decrease Outcome: Progressing   Problem: Elimination: Goal: Will not experience complications related to bowel motility Outcome: Progressing Goal: Will not experience complications related to urinary retention Outcome: Progressing   Problem: Pain Managment: Goal: General experience of comfort will improve Outcome: Progressing   Problem: Safety: Goal: Ability to remain free from injury will improve Outcome: Progressing   Problem: Skin Integrity: Goal: Risk for impaired skin integrity will decrease Outcome: Progressing   Problem: Education: Goal: Ability to demonstrate management of disease process will improve Outcome:  Progressing Goal: Ability to verbalize understanding of medication therapies will improve Outcome: Progressing Goal: Individualized Educational Video(s) Outcome: Progressing   Problem: Activity: Goal: Capacity to carry out activities will improve Outcome: Progressing   Problem: Cardiac: Goal: Ability to achieve and maintain adequate cardiopulmonary perfusion will improve Outcome: Progressing   

## 2022-02-13 NOTE — Progress Notes (Addendum)
This chaplain responded to PMT consult for spiritual care; major life transition. The Pt. is awake sitting in bedside recliner. The chaplain understands the Pt. is uncomfortable and prefers to move to the bed. The chaplain updated the RN.  The Pt. friend-Donna and Pt. daughter-[name] are visiting. The chaplain understands the family is leaving and the Pt. is open to a visit later in the day. [name] will return this afternoon.  **[id number] This chaplain sat with the Pt. and her daughter-[name]. For over an hour, [name]'s story telling reflected on the past and added importance to mother and daughter plans for the future.  The chaplain understands their is an inherent inner strength in the Pt. role as mother in [place], [place], and later [place]. The chaplain understands [name] hopes the Pt. will return to [place] with her so she can continue the love.  The Pt. and [name] accepted the chaplain's invitation for F/U spiritual care.  Chaplain Stephanie Acre [phone]

## 2022-02-13 NOTE — Progress Notes (Addendum)
Progress Note  Patient Name: Lindsey Pollard Date of Encounter: 02/13/2022  [facility] Cardiologist: None   Subjective   Patient reports that her breathing is significantly improved. Denies chest pain, palpitations.   Inpatient Medications    Scheduled Meds:  aspirin  81 mg Oral Daily   atorvastatin  80 mg Oral QPM   clopidogrel  75 mg Oral Q breakfast   empagliflozin  10 mg Oral Daily   furosemide  80 mg Intravenous BID   heparin injection (subcutaneous)  5,000 Units Subcutaneous Q8H   metoprolol succinate  25 mg Oral Daily   sodium chloride flush  3 mL Intravenous Q12H   sodium chloride flush  3 mL Intravenous Q12H   sodium chloride flush  3 mL Intravenous Q12H   spironolactone  25 mg Oral Daily   Continuous Infusions:  sodium chloride     PRN Meds: sodium chloride, acetaminophen **OR** acetaminophen, acetaminophen, albuterol, ALPRAZolam, cyclobenzaprine, melatonin, nitroGLYCERIN, ondansetron (ZOFRAN) IV, sodium chloride flush   Vital Signs    Vitals:   02/12/22 1930 02/13/22 0034 02/13/22 0349 02/13/22 0847  BP: (!) 138/55 (!) 143/88 130/79 (!) 151/57  Pulse: 72 77 76 78  Resp: 20 19 20 16   Temp: 98.4 F (36.9 C) 98.4 F (36.9 C) 97.7 F (36.5 C) 98.9 F (37.2 C)  TempSrc: Oral Oral Oral Oral  SpO2:  97% 95% 99%  Weight:      Height:        Intake/Output Summary (Last 24 hours) at 02/13/2022 1120 Last data filed at 02/13/2022 0346 Gross per 24 hour  Intake 27.87 ml  Output 1250 ml  Net -1222.13 ml      02/09/2022    7:00 AM 02/07/2022   12:39 AM 02/06/2022    7:54 PM  Last 3 Weights  Weight (lbs) 129 lb 3 oz 125 lb 3.5 oz 122 lb 5.7 oz  Weight (kg) 58.6 kg 56.8 kg 55.5 kg      Telemetry    Sinus rhythm with frequent PVCs - Personally Reviewed  ECG    No new tracings - Personally Reviewed  Physical Exam   GEN: No acute distress. Laying flat in the bed  Neck: No JVD Cardiac: RRR, grade 2/6 systolic murmur throughout pericardium   Respiratory: Crackles in bilateral lung bases  GI: Soft, nontender, non-distended  MS: No edema; No deformity. Neuro:  Nonfocal  Psych: Normal affect   Labs    High Sensitivity Troponin:   Recent Labs  Lab 02/06/22 0629 02/06/22 0933 02/06/22 1052 02/08/22 0836 02/08/22 1037  TROPONINIHS 145* 621* 833* 462* 421*     Chemistry Recent Labs  Lab 02/09/22 0407 02/10/22 0253 02/11/22 0427 02/12/22 0928 02/13/22 0341  NA 135   < > 131* 133* 132*  K 3.3*   < > 3.9 3.6 3.8  CL 92*   < > 89* 84* 85*  CO2 32   < > 31 37* 37*  GLUCOSE 146*   < > 124* 172* 163*  BUN 34*   < > 30* 27* 32*  CREATININE 1.02*   < > 0.88 1.10* 1.08*  CALCIUM 8.8*   < > 8.8* 9.3 9.0  MG 2.4  --   --   --   --   PROT 5.3*  --   --   --   --   ALBUMIN 2.9*  --   --   --   --   AST 18  --   --   --   --  ALT 18  --   --   --   --   ALKPHOS 84  --   --   --   --   BILITOT 1.0  --   --   --   --   GFRNONAA 56*   < > >60 51* 52*  ANIONGAP 11   < > 11 12 10    < > = values in this interval not displayed.    Lipids No results for input(s): "CHOL", "TRIG", "HDL", "LABVLDL", "LDLCALC", "CHOLHDL" in the last 168 hours.  Hematology Recent Labs  Lab 02/10/22 0253 02/11/22 0427 02/12/22 0928  WBC 11.7* 13.3* 9.5  RBC 2.91* 2.65* 2.93*  HGB 8.7* 8.0* 8.6*  HCT 27.3* 24.4* 27.2*  MCV 93.8 92.1 92.8  MCH 29.9 30.2 29.4  MCHC 31.9 32.8 31.6  RDW 15.6* 15.7* 15.9*  PLT 260 227 249   Thyroid No results for input(s): "TSH", "FREET4" in the last 168 hours.  BNPNo results for input(s): "BNP", "PROBNP" in the last 168 hours.  DDimer No results for input(s): "DDIMER" in the last 168 hours.   Radiology    No results found.  Cardiac Studies   Echocardiogram 01/12/2022   1. Left ventricular ejection fraction, by estimation, is 45 to 50%. The  left ventricle has mildly decreased function. The left ventricle  demonstrates global hypokinesis. The left ventricular internal cavity size  was mildly dilated.  Left ventricular  diastolic function could not be evaluated.   2. Right ventricular systolic function is normal.   3. Moderate pleural effusion in the left lateral region.   4. The mitral valve is normal in structure.   5. The aortic valve is normal in structure. Aortic valve regurgitation is  not visualized.   Conclusion(s)/Recommendation(s): Poor windows for evaluation of left  ventricular function by transthoracic echocardiography. Would recommend an alternative means of evaluation.    Left Heart Catheterization 02/07/2022      Ost LM to Ost LAD lesion is 95% stenosed.   Prox LAD to Mid LAD lesion is 100% stenosed.   Mid LAD lesion is 100% stenosed.   Mid RCA lesion is 50% stenosed.   Ost RCA lesion is 80% stenosed.   Origin lesion is 100% stenosed.   Prox Cx lesion is 80% stenosed.   Post intervention, there is a 0% residual stenosis.   1.  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 2.  Underexpanded ostial right coronary artery stent due to surrounding calcium with a pre-Shockwave lithotripsy area of 3.72 mm and a post shockwave area of 6.56 mm; no additional stents were implanted. 3.  LVEDP of 20 mmHg.   Commendation: Dual antiplatelet therapy for at least 1 year. Diagnostic Dominance: Right  Intervention     Patient Profile     81 y.o. female  with a hx of CAD s/p CABG in 2018, ICM , CKD-3, HLD, DM-2, VF arrest, GERD admitted with NSTEMI, recurrent episodes of acute decompensation of chronic by systolic and diastolic heart failure, paroxysmal atrial fibrillation, mitral insufficiency underwent revascularization for proximal right coronary artery restenosis 02/07/2022 (angioplasty/shockwave lithotripsy, no new stents)  Assessment & Plan    Acute on Chronic Systolic Heart Failure  - Patient presented on 8/15 with SOB, orthopnea. BNP elevated to 2700. CXR With moderate-lage bilateral pleural effusions with bibasilar collapse or  consolidation. Note, this is her 5th admission this summer with acute CHF/NSTEMI, one admission was complicated by VF arrest  -  She underwent LHC on 8/16 that showed patent LIMA to LAD, high-grade severe native vessel disease of the left system, RIMA-RCA was occluded, ostial RCA lesion was 80% stenosed. Noted that ostial RCA stent was underexpanded due to surrounding calcium. Underwent shockwave lithotripsy  - Patient was started on IV diuretics. CXR 8/18 showed stable to slightly worse bilateral perihilar and bibasilar opacification, patient's respiratory status worsened and she required bi-pap. Concern that patient developed flash pulmonary edema in the setting of high BP - Now has been diuresing on IV lasix 80 mg BID- output 2.0 L urine yesterday. Creatinine 1.08 (was 1.10 yesterday) - Continue IV diuresis today, but I suspect she will be able to transition to PO soon (possibly tomorrow)  - Continue jardiance 10 mg daily, metoprolol 25 mg daily, spironolactone 25 mg daily  - Agree with palliative care consultation given patient's frequent admissions in the past few months.    HTN - Continue metoprolol 25 mg daily, spironolactone 25 mg daily     PVCs  - Per telemetry, patient is in NSR with frequent PVCs  - On metoprolol  - Maintain K>4, mag>2    CAD  - LHC this admission as above  - Continue DAPT with ASA, clopidgrel for at least 1 year (theoretically could stop after 30 days, but patient has complex coronary anatomy and would at least be on clopidogrel long term) - Continue metoprolol, lipitor         For questions or updates, please contact [facility] Please consult [email or web address] for contact info under        Signed, Jonita Albee, PA-C  02/13/2022, 11:20 AM    Patient seen and examined with KJ PA-C.  Agree as above, with the following exceptions and changes as noted below. Appears brighter today. Gen: NAD, CV: RRR, 2/6 SM, Lungs: clear, Abd: soft, Extrem: Warm, well  perfused, no edema, Neuro/Psych: alert and oriented x 3, normal mood and affect. All available labs, radiology testing, previous records reviewed. We will attempt to optimize volume status fully with continued IV lasix, reassess tomorrow and with patient activity for continued dosing and timing of transition to oral.  Parke Poisson, MD 02/13/22 12:09 PM

## 2022-02-13 NOTE — TOC Progression Note (Signed)
Transition of Care [facility]) - Progression Note    Patient Details  Name: Lindsey Pollard MRN: [id number] Date of Birth: 26-Jan-1941  Transition of Care [facility]) CM/SW Contact  Graves-Bigelow, Lamar Laundry, RN Phone Number: 02/13/2022, 2:50 PM  Clinical Narrative: Case Manager received a consult to arrange for home hospice services. Case Manager spoke with daughter [name] and she is agreeable to home hospice with [facility]. Referral submitted to [facility] with DME needs: hospital bed, overbed table, oxygen, and bedside commode. Daughter wants patient to transition home via ambulance transport. Case Manager will continue to follow for additional transition of care needs.   Expected Discharge Plan: Home w Hospice Care Barriers to Discharge: No Barriers Identified  Expected Discharge Plan and Services Expected Discharge Plan: Home w Hospice Care In-house Referral: Clinical Social Work Discharge Planning Services: CM Consult Post Acute Care Choice: Hospice Living arrangements for the past 2 months: Skilled Nursing Facility (plan to return home)                 DME Arranged: Hospital bed, Overbed table, Oxygen, Bedside commode [facility]) DME Agency: [facility]) Date DME Agency Contacted: 02/13/22 Time DME Agency Contacted: 1200 Representative spoke with at DME Agency: [facility] HH Arranged: RN [facility] Agency: [facility] [place] [facility] Date [facility] Agency Contacted: 02/13/22 Time HH Agency Contacted: 1200 Representative spoke with at [facility] Agency: [place]  Social Determinants of Health (SDOH) Interventions Food Insecurity Interventions: Intervention Not Indicated Financial Strain Interventions: Intervention Not Indicated Housing Interventions: Intervention Not Indicated Transportation Interventions: Intervention Not Indicated  Readmission Risk Interventions    02/07/2022    11:31 AM 01/18/2022    9:04 PM 01/05/2022    5:09 PM  Readmission Risk Prevention Plan  Transportation Screening Complete Complete Complete  PCP or Specialist Appt within 3-5 Days   Complete  Social Work Consult for Recovery Care Planning/Counseling   Not Complete  SW consult not completed comments   NA  Palliative Care Screening   Not Applicable  Medication Review [employer]) Complete Referral to Pharmacy Referral to Pharmacy  PCP or Specialist appointment within 3-5 days of discharge  Complete   HRI or Home Care Consult Complete Complete   SW Recovery Care/Counseling Consult Complete Not Complete   SW Consult Not Complete Comments  NA   Palliative Care Screening Not Applicable Not Applicable   Skilled Nursing Facility Complete Complete

## 2022-02-13 NOTE — Progress Notes (Signed)
PROGRESS NOTE    Lindsey Pollard  GYJ:[id number] DOB: 02/06/1941 DOA: 02/06/2022 PCP: Lauro Regulus, MD  81/F with history of CAD, CABG, ischemic cardiomyopathy, CKD 3 A, type 2 diabetes mellitus, hospitalized with NSTEMI, V-fib arrest, ventilator dependent respiratory failure in June at [facility] followed by frequent admissions with CHF Back in the ED with worsening dyspnea X 2 days, respiratory distress placed on BiPAP, BNP was 2726, troponin up to 833, chest x-ray noted moderate to large bilateral pleural effusions, interstitial edema. -Cath 8/16 with significant disease in the left system, patent LIMA-LAD; ISR in the ostial RCA treated with shockwave lithotripsy, no additional stents -Recurrent flash pulmonary edema 8/18 -Palliative care consulted as well  Subjective: -Feels a little better overall, breathing improving, urinated more after metolazone yesterday  Assessment and Plan:  Acute on chronic respiratory failure with hypoxia  Acute on chronic systolic CHF -Required BiPAP intermittently, now nightly -Concern for progressive CAD causing recurrent pulmonary edema had RCA restenosis treated with shockwave lithotripsy 8/16 -Last echo 7/23 with EF 45% -Repeat x-ray with persistent bilateral effusions, interstitial edema -Continue IV Lasix 80 Mg twice daily, she is 6.6 L negative after metolazone yesterday, continue metoprolol, Aldactone and Jardiance -Palliative consulted and following, now limited code, hospice being considered at discharge once medically optimized   NSTEMI CAD/CABG and history of stent V-fib arrest -Multiple recent admissions with same, troponin peaked at 833, on heparin and nitro gtt. -Cards following, left heart cath 8/16 with significant left-sided disease, patent LIMA/LAD, ISR in the ostial RCA treated with shockwave lithotripsy, recommended aspirin Plavix for 12 months  -Continue metoprolol and statin   Controlled diabetes mellitus type 2 without long-term  use of insulin, with hyperglycemia -Last hemoglobin A1c 6.8 on 12/06/2021.   -Continue Jardiance   Anemia of chronic kidney disease -Hemoglobin down to 7.8 few days ago, recent baseline has been in the 8.5-9 range -Off IV heparin, denies overt bleeding, anemia panel with severe iron deficiency -Continue IV iron, now stable  Mild leukocytosis -She is afebrile, could be reactive, no overt signs or symptoms of infection -WBC improving  CKD stage IIIa -Stable, monitor with diuresis   Hyperlipidemia -Continue atorvastatin   DVT prophylaxis: Lovenox Code Status: Limited code, no intubation Family Communication: No family at bedside, called and updated daughter [name] 8/21 Disposition Plan: Home with hospice being considered  Consultants:  Cardiology   Procedures: Cath 8/16 with significant disease in the left system, patent LIMA-LAD; ISR in the ostial RCA treated with shockwave lithotripsy, no additional stents  Antimicrobials:    Objective: Vitals:   02/12/22 1930 02/13/22 0034 02/13/22 0349 02/13/22 0847  BP: (!) 138/55 (!) 143/88 130/79 (!) 151/57  Pulse: 72 77 76 78  Resp: 20 19 20 16   Temp: 98.4 F (36.9 C) 98.4 F (36.9 C) 97.7 F (36.5 C) 98.9 F (37.2 C)  TempSrc: Oral Oral Oral Oral  SpO2:  97% 95% 99%  Weight:      Height:        Intake/Output Summary (Last 24 hours) at 02/13/2022 1355 Last data filed at 02/13/2022 0346 Gross per 24 hour  Intake 27.87 ml  Output 1250 ml  Net -1222.13 ml   Filed Weights   02/06/22 1954 02/07/22 0039 02/09/22 0700  Weight: 55.5 kg 56.8 kg 58.6 kg    Examination:  Elderly chronically ill female sitting up in bed, AAOx3, no distress HEENT: Positive JVD CVS: S1 and S2, irregularly irregular rhythm Lungs: Improved air movement, rare basilar rales Abdomen:  Soft, nontender, bowel sounds present Extremities: No edema Psychiatry:  Mood & affect appropriate.   Data Reviewed:   CBC: Recent Labs  Lab 02/08/22 0321  02/08/22 1037 02/09/22 0407 02/10/22 0253 02/11/22 0427 02/12/22 0928  WBC 10.2  --  10.5 11.7* 13.3* 9.5  HGB 7.8* 7.9* 7.8* 8.7* 8.0* 8.6*  HCT 24.7*  --  24.0* 27.3* 24.4* 27.2*  MCV 93.6  --  92.3 93.8 92.1 92.8  PLT 224  --  218 260 227 0000000   Basic Metabolic Panel: Recent Labs  Lab 02/09/22 0407 02/10/22 0253 02/11/22 0427 02/12/22 0928 02/13/22 0341  NA 135 136 131* 133* 132*  K 3.3* 4.4 3.9 3.6 3.8  CL 92* 91* 89* 84* 85*  CO2 32 34* 31 37* 37*  GLUCOSE 146* 110* 124* 172* 163*  BUN 34* 28* 30* 27* 32*  CREATININE 1.02* 0.96 0.88 1.10* 1.08*  CALCIUM 8.8* 9.2 8.8* 9.3 9.0  MG 2.4  --   --   --   --    GFR: Estimated Creatinine Clearance: 32.4 mL/min (A) (by C-G formula based on SCr of 1.08 mg/dL (H)). Liver Function Tests: Recent Labs  Lab 02/09/22 0407  AST 18  ALT 18  ALKPHOS 84  BILITOT 1.0  PROT 5.3*  ALBUMIN 2.9*   No results for input(s): "LIPASE", "AMYLASE" in the last 168 hours. No results for input(s): "AMMONIA" in the last 168 hours. Coagulation Profile: No results for input(s): "INR", "PROTIME" in the last 168 hours. Cardiac Enzymes: No results for input(s): "CKTOTAL", "CKMB", "CKMBINDEX", "TROPONINI" in the last 168 hours. BNP (last 3 results) No results for input(s): "PROBNP" in the last 8760 hours. HbA1C: No results for input(s): "HGBA1C" in the last 72 hours. CBG: Recent Labs  Lab 02/06/22 2117 02/07/22 0630 02/07/22 1812 02/07/22 2240 02/08/22 0637  GLUCAP 159* 169* 158* 119* 196*   Lipid Profile: No results for input(s): "CHOL", "HDL", "LDLCALC", "TRIG", "CHOLHDL", "LDLDIRECT" in the last 72 hours. Thyroid Function Tests: No results for input(s): "TSH", "T4TOTAL", "FREET4", "T3FREE", "THYROIDAB" in the last 72 hours. Anemia Panel: No results for input(s): "VITAMINB12", "FOLATE", "FERRITIN", "TIBC", "IRON", "RETICCTPCT" in the last 72 hours.  Urine analysis:    Component Value Date/Time   COLORURINE YELLOW 02/06/2022 1100    APPEARANCEUR CLEAR 02/06/2022 1100   LABSPEC 1.009 02/06/2022 1100   PHURINE 5.0 02/06/2022 1100   GLUCOSEU >=500 (A) 02/06/2022 1100   HGBUR NEGATIVE 02/06/2022 1100   BILIRUBINUR NEGATIVE 02/06/2022 1100   KETONESUR NEGATIVE 02/06/2022 1100   PROTEINUR NEGATIVE 02/06/2022 1100   NITRITE NEGATIVE 02/06/2022 1100   LEUKOCYTESUR TRACE (A) 02/06/2022 1100   Sepsis Labs: @LABRCNTIP (procalcitonin:4,lacticidven:4)  ) Recent Results (from the past 240 hour(s))  Blood culture (routine x 2)     Status: None   Collection Time: 02/06/22  4:04 AM   Specimen: BLOOD  Result Value Ref Range Status   Specimen Description BLOOD SITE NOT SPECIFIED  Final   Special Requests   Final    BOTTLES DRAWN AEROBIC AND ANAEROBIC Blood Culture results may not be optimal due to an excessive volume of blood received in culture bottles   Culture   Final    NO GROWTH 5 DAYS Performed at [facility], [place] [address] [place], [place] [address]    Report Status 02/11/2022 FINAL  Final  Blood culture (routine x 2)     Status: None   Collection Time: 02/06/22  4:18 AM   Specimen: BLOOD  Result Value  Ref Range Status   Specimen Description BLOOD SITE NOT SPECIFIED  Final   Special Requests   Final    BOTTLES DRAWN AEROBIC AND ANAEROBIC Blood Culture results may not be optimal due to an excessive volume of blood received in culture bottles   Culture   Final    NO GROWTH 5 DAYS Performed at [facility], [address], [place], [place] [address]    Report Status 02/11/2022 FINAL  Final  SARS Coronavirus 2 by RT PCR (hospital order, performed in [facility] hospital lab) *cepheid single result test*     Status: None   Collection Time: 02/06/22  5:16 AM   Specimen: Nasal Swab  Result Value Ref Range Status   SARS Coronavirus 2 by RT PCR NEGATIVE NEGATIVE Final    Comment: (NOTE) SARS-CoV-2 target nucleic acids are NOT DETECTED.  The SARS-CoV-2 RNA is generally detectable in upper and  lower respiratory specimens during the acute phase of infection. The lowest concentration of SARS-CoV-2 viral copies this assay can detect is 250 copies / mL. A negative result does not preclude SARS-CoV-2 infection and should not be used as the sole basis for treatment or other patient management decisions.  A negative result may occur with improper specimen collection / handling, submission of specimen other than nasopharyngeal swab, presence of viral mutation(s) within the areas targeted by this assay, and inadequate number of viral copies (<250 copies / mL). A negative result must be combined with clinical observations, patient history, and epidemiological information.  Fact Sheet for Patients:   RoadLapTop.co.za  Fact Sheet for Healthcare Providers: http://kim-miller.com/  This test is not yet approved or  cleared by the [place] FDA and has been authorized for detection and/or diagnosis of SARS-CoV-2 by FDA under an Emergency Use Authorization (EUA).  This EUA will remain in effect (meaning this test can be used) for the duration of the COVID-19 declaration under Section 564(b)(1) of the Act, 21 U.S.C. section 360bbb-3(b)(1), unless the authorization is terminated or revoked sooner.  Performed at [facility], [address] [place], [place] [address]   Urine Culture     Status: Abnormal   Collection Time: 02/06/22  8:29 AM   Specimen: Urine, Clean Catch  Result Value Ref Range Status   Specimen Description URINE, CLEAN CATCH  Final   Special Requests NONE  Final   Culture (A)  Final    <10,000 COLONIES/mL INSIGNIFICANT GROWTH Performed at [facility], [address], [place], [place] [address]    Report Status 02/07/2022 FINAL  Final     Radiology Studies: No results found.   Scheduled Meds:  aspirin  81 mg Oral Daily   atorvastatin  80 mg Oral QPM   clopidogrel  75 mg Oral Q breakfast    empagliflozin  10 mg Oral Daily   furosemide  80 mg Intravenous BID   heparin injection (subcutaneous)  5,000 Units Subcutaneous Q8H   metoprolol succinate  25 mg Oral Daily   sodium chloride flush  3 mL Intravenous Q12H   sodium chloride flush  3 mL Intravenous Q12H   sodium chloride flush  3 mL Intravenous Q12H   spironolactone  25 mg Oral Daily   Continuous Infusions:  sodium chloride       LOS: 7 days    Time spent:  Zannie Cove, MD [facility]   02/13/2022, 1:55 PM

## 2022-02-14 DIAGNOSIS — I25110 Atherosclerotic heart disease of native coronary artery with unstable angina pectoris: Secondary | ICD-10-CM | POA: Diagnosis not present

## 2022-02-14 DIAGNOSIS — J9621 Acute and chronic respiratory failure with hypoxia: Secondary | ICD-10-CM | POA: Diagnosis not present

## 2022-02-14 DIAGNOSIS — N1831 Chronic kidney disease, stage 3a: Secondary | ICD-10-CM | POA: Diagnosis not present

## 2022-02-14 DIAGNOSIS — I5043 Acute on chronic combined systolic (congestive) and diastolic (congestive) heart failure: Secondary | ICD-10-CM | POA: Diagnosis not present

## 2022-02-14 LAB — BASIC METABOLIC PANEL
Anion gap: 14 (ref 5–15)
BUN: 38 mg/dL — ABNORMAL HIGH (ref 8–23)
CO2: 36 mmol/L — ABNORMAL HIGH (ref 22–32)
Calcium: 9.5 mg/dL (ref 8.9–10.3)
Chloride: 80 mmol/L — ABNORMAL LOW (ref 98–111)
Creatinine, Ser: 1.04 mg/dL — ABNORMAL HIGH (ref 0.44–1.00)
GFR, Estimated: 54 mL/min — ABNORMAL LOW (ref >60)
Glucose, Bld: 114 mg/dL — ABNORMAL HIGH (ref 70–99)
Potassium: 3.5 mmol/L (ref 3.5–5.1)
Sodium: 130 mmol/L — ABNORMAL LOW (ref 135–145)

## 2022-02-14 MED ORDER — FUROSEMIDE 40 MG PO TABS
80.0000 mg | ORAL_TABLET | Freq: Every day | ORAL | Status: DC
  Filled 2022-02-14: qty 2

## 2022-02-14 MED ORDER — FUROSEMIDE 40 MG PO TABS
80.0000 mg | ORAL_TABLET | Freq: Every day | ORAL | Status: DC
  Administered 2022-02-14 – 2022-02-16 (×3): 80 mg via ORAL
  Filled 2022-02-14 (×3): qty 2

## 2022-02-14 NOTE — Assessment & Plan Note (Addendum)
Her Hgb has been stable, her hgb at the time of her discharge is 8.6

## 2022-02-14 NOTE — Progress Notes (Signed)
Patient is on [place] with sat of 97%, in no distress and does not want to go on Bipap.

## 2022-02-14 NOTE — Hospital Course (Addendum)
Mrs. Dauphin was admitted to the hospital with the working diagnosis of heart failure.   81 yo female with the past medical history of hypertension, dyslipidemia, heart failure, coronary artery disease, CKD and T2DM who presented with respiratory distress. Recent hospitalization for heart failure 08/07 to 02/03/22, discharged to SNF. Reported recurrent dyspnea and orthopnea, EMS was called and patient was found hypoxic 86% on 4 L per [place], she was placed on Cpap and transported to the ED.  On her initial physical examination her blood pressure was 146/50, HR 60 and RR 21 with 02 saturation 100%, on bipap. Lungs with rales but no wheezing, heart with S1 and S2 present and rhythmic, abdomen not distended and no lower extremity edema.   Na 134, K 3,7 Cl 89, bicarbonate 32, glucose 272 bun 26 cr 1,0 BNP 2,765  High sensitive troponin 46, 145 , 621, 833  Wbc 16, hgb 9,6 plt 336   Chest radiograph with bilateral interstitial infiltrates with cephalization of the vasculature and bilateral pleural effusions, more right than left.   EKG 98 bpm, normal axis, normal intervals, sinus rhythm with V4 to V6 ST depression with no T wave changes.   Patient placed on IV furosemide IV heparin for NSTEMI.   08/16 cardiac catheterization with significant coronary artery disease. Patent LIMA to LAD. ISR in the ostial RCA, treated with shockwave lithotripsy, no additional stents.   08/18 recurrent flash pulmonary edema, that required further diuresis.  Palliative care has been consulted and plan for home hospice.   08/24 patient transitioned to oral diuretic therapy with good toleration.  08/25 patient has remained stable, plan to dc home today with hospice services.

## 2022-02-14 NOTE — Progress Notes (Signed)
This chaplain is present for F/U spiritual care. The Pt. is sleeping and the Pt. daughter-[name] steps into the hall to talk to the chaplain.   The chaplain listened reflectively as [name] continued to make connections with Pt. and family in her story telling. The chaplain understands [name] is comfortable with the Pt. decision to go home with hospice care and is supported by Pt. nephew-Douglas. The chaplain affirmed [name]'s active communication with [facility] liaison-Shanita.   The chaplain understands [name] is open to sharing her faith in God. [name] shares she is accepting of the Pt. decision to keep her faith private.  The chaplain affirmed God's love and willingness to meet people where they are. [name] appreciated the chaplain's spiritual care and continued presence.  Chaplain Stephanie Acre [phone]

## 2022-02-14 NOTE — Assessment & Plan Note (Addendum)
Echocardiogram with mild reduction in EF 45 to 50% with global hypokinesis, mild dilated internal cavity. RV systolic function preserved.   Patient was placed on aggressive diuretic therapy with IV furosemide, negative fluid balance was achieved, -9,993 ml, with significant improvement in her symptoms.   Acute hypoxemic respiratory failure, improved oxygenation. She was liberated from non invasive mechanical ventilation. At the time of her discharge hjer 02 saturation was 99% on 2 l/min per [place]   Medical therapy with empaglifloozin, furosemide, metoprolol, and spironolactone.   Patient with progressive decline in her health with multiple re-hospitalizations,  Plan for discharge home with hospice services.

## 2022-02-14 NOTE — Assessment & Plan Note (Addendum)
Hyponatremia.  Patient's volume status has improved, renal function at the time of her discharge has a serum cr of 1,13 with K at 4,1 and serum bicarbonate at 32. Na 127   Plan to continue diuresis with furosemide, SGLT2 and spironolactone at home. Follow up rena function as outpatient.

## 2022-02-14 NOTE — Care Management Important Message (Signed)
Important Message  Patient Details  Name: Lindsey Pollard MRN: [id number] Date of Birth: 1941/04/09   Medicare Important Message Given:  Yes     Renie Ora 02/14/2022, 7:52 AM

## 2022-02-14 NOTE — Progress Notes (Signed)
Progress Note   Patient: Lindsey Pollard JSE:[id number] DOB: [birth year] DOA: 02/06/2022     8 DOS: the patient was seen and examined on 02/14/2022   Brief hospital course: Mrs. Goodell was admitted to the hospital with the working diagnosis of heart failure.   81 yo female with the past medical history of hypertension, dyslipidemia, heart failure, coronary artery disease, CKD and T2DM who presented with respiratory distress. Recent hospitalization for heart failure 08/07 to 02/03/22, discharged to SNF. Reported recurrent dyspnea and orthopnea, EMS was called and patient was found hypoxie 86% on 4 L per [place], she was placed on Cpap and transported to the ED.  On her initial physical examination her blood pressure was 146/50, HR 60 and RR 21 with 02 saturation 100%, on bipap. Lungs with rales but no wheezing, heart with S1 and S2 present and rhythmic, abdomen not distended and no lower extremity edema.   Na 134, K 3,7 Cl 89, bicarbonate 32, glucose 272 bun 26 cr 1,0 BNP 2,765  High sensitive troponin 46, 145 , 621, 833  Wbc 16, hgb 9,6 plt 336   Chest radiograph with bilateral interstitial infiltrates with cephalization of the vasculature and bilateral pleural effusions, more right than left.   EKG 98 bpm, normal axis, normal intervals, sinus rhythm with V4 to V6 ST depression with no T wave changes.   Patient placed on IV furosemide IV heparin for NSTEMI.   08/16 cardiac catheterization with significant coronary artery disease. Patent LIMA to LAD. ISR in the ostial RCA, treated with shockwave lithotripsy, no additional stents.   08/18 recurrent flash pulmonary edema.  Palliative care has been consulted and plan for home hospice.     Assessment and Plan: * Acute on chronic combined systolic (congestive) and diastolic (congestive) heart failure (HCC) Patient with improvement in volume status.  Plan to continue diuresis with oral torsemide.  Continue blood pressure monitoring.   Patient with  recurrent pulmonary edema.  On metoprolol, spironolactone and empagliflozin   Acute on chronic respiratory failure with hypoxia (HCC) Improved dyspnea, continue supplemental 02 per [place] and as needed bipap.   CAD (coronary artery disease) NSTEMI. Patient had medical therapy with IV heparin.  Currently with no chest pain. Plan to continue with dual antiplatelet therapy and statin Continue with metoprolol.    Controlled type 2 diabetes mellitus without complication, without long-term current use of insulin (HCC) Continue insulin sliding scale for glucose cover and monitoring.   Hyperlipidemia, continue with statin therapy.   Chronic kidney disease, stage 3a (HCC) Renal function has been stable, plan to continue diuresis to keep negative fluid balance.   Anemia due to chronic kidney disease Continue close follow up on cell count.  NO indication for PRBC transfusion.         Subjective: Patient with improvement in dyspnea, no chest pain.   Physical Exam: Vitals:   02/14/22 0612 02/14/22 0827 02/14/22 1147 02/14/22 1536  BP: (!) 143/56 (!) 140/56 (!) 134/48 (!) 134/49  Pulse: 73 72 71 75  Resp: 18   18  Temp: 98 F (36.7 C)  97.8 F (36.6 C) 97.7 F (36.5 C)  TempSrc: Oral Oral Oral   SpO2: 98% 99% 98% 95%  Weight:      Height:       Neurology awake and alert ENT with mild pallor Cardiovascular with S1 and S2 present with no gallops Respiratory with no rales Abdomen not distended No lower extremity edema  Data Reviewed:    Family  Communication: no family at the bedside   Disposition: Status is: Inpatient Remains inpatient appropriate because: heart failure   Planned Discharge Destination: Home    Author: Coralie Keens, MD 02/14/2022 3:37 PM  For on call review [email or web address].

## 2022-02-14 NOTE — Assessment & Plan Note (Signed)
NSTEMI. Patient had medical therapy with IV heparin.  Currently with no chest pain. Plan to continue with dual antiplatelet therapy and statin Continue with metoprolol.

## 2022-02-14 NOTE — Progress Notes (Addendum)
Progress Note  Patient Name: Lindsey Pollard Date of Encounter: 02/14/2022  [facility] Cardiologist: None   Subjective   Patient denies chest pain, sob. Daughter at bedside-- planning to go home with hospice   Inpatient Medications    Scheduled Meds:  aspirin  81 mg Oral Daily   atorvastatin  80 mg Oral QPM   clopidogrel  75 mg Oral Q breakfast   empagliflozin  10 mg Oral Daily   furosemide  80 mg Intravenous BID   heparin injection (subcutaneous)  5,000 Units Subcutaneous Q8H   metoprolol succinate  25 mg Oral Daily   sodium chloride flush  3 mL Intravenous Q12H   sodium chloride flush  3 mL Intravenous Q12H   sodium chloride flush  3 mL Intravenous Q12H   spironolactone  25 mg Oral Daily   Continuous Infusions:  sodium chloride     PRN Meds: sodium chloride, acetaminophen **OR** acetaminophen, acetaminophen, albuterol, ALPRAZolam, cyclobenzaprine, melatonin, nitroGLYCERIN, ondansetron (ZOFRAN) IV, sodium chloride flush   Vital Signs    Vitals:   02/13/22 2000 02/14/22 0034 02/14/22 0612 02/14/22 0827  BP:  (!) 141/53 (!) 143/56 (!) 140/56  Pulse:  88 73 72  Resp:  16 18   Temp:  97.7 F (36.5 C) 98 F (36.7 C)   TempSrc:  Oral Oral Oral  SpO2: 98% 98% 98% 99%  Weight:      Height:        Intake/Output Summary (Last 24 hours) at 02/14/2022 1017 Last data filed at 02/13/2022 1700 Gross per 24 hour  Intake --  Output 950 ml  Net -950 ml      02/09/2022    7:00 AM 02/07/2022   12:39 AM 02/06/2022    7:54 PM  Last 3 Weights  Weight (lbs) 129 lb 3 oz 125 lb 3.5 oz 122 lb 5.7 oz  Weight (kg) 58.6 kg 56.8 kg 55.5 kg      Telemetry    Sinus rhythm with frequent PVCs  - Personally Reviewed  ECG    No new tracings - Personally Reviewed  Physical Exam   GEN: No acute distress. Patient is sleeping when I entered the room, aroused easily to voice.  Neck: No JVD Cardiac: RRR, no murmurs, rubs, or gallops.  Respiratory: Crackles in bilateral lung bases   GI: Soft, nontender, non-distended  MS: No edema; No deformity. Neuro:  Nonfocal  Psych: Normal affect   Labs    High Sensitivity Troponin:   Recent Labs  Lab 02/06/22 0629 02/06/22 0933 02/06/22 1052 02/08/22 0836 02/08/22 1037  TROPONINIHS 145* 621* 833* 462* 421*     Chemistry Recent Labs  Lab 02/09/22 0407 02/10/22 0253 02/12/22 0928 02/13/22 0341 02/14/22 0357  NA 135   < > 133* 132* 130*  K 3.3*   < > 3.6 3.8 3.5  CL 92*   < > 84* 85* 80*  CO2 32   < > 37* 37* 36*  GLUCOSE 146*   < > 172* 163* 114*  BUN 34*   < > 27* 32* 38*  CREATININE 1.02*   < > 1.10* 1.08* 1.04*  CALCIUM 8.8*   < > 9.3 9.0 9.5  MG 2.4  --   --   --   --   PROT 5.3*  --   --   --   --   ALBUMIN 2.9*  --   --   --   --   AST 18  --   --   --   --  ALT 18  --   --   --   --   ALKPHOS 84  --   --   --   --   BILITOT 1.0  --   --   --   --   GFRNONAA 56*   < > 51* 52* 54*  ANIONGAP 11   < > 12 10 14    < > = values in this interval not displayed.    Lipids No results for input(s): "CHOL", "TRIG", "HDL", "LABVLDL", "LDLCALC", "CHOLHDL" in the last 168 hours.  Hematology Recent Labs  Lab 02/10/22 0253 02/11/22 0427 02/12/22 0928  WBC 11.7* 13.3* 9.5  RBC 2.91* 2.65* 2.93*  HGB 8.7* 8.0* 8.6*  HCT 27.3* 24.4* 27.2*  MCV 93.8 92.1 92.8  MCH 29.9 30.2 29.4  MCHC 31.9 32.8 31.6  RDW 15.6* 15.7* 15.9*  PLT 260 227 249   Thyroid No results for input(s): "TSH", "FREET4" in the last 168 hours.  BNPNo results for input(s): "BNP", "PROBNP" in the last 168 hours.  DDimer No results for input(s): "DDIMER" in the last 168 hours.   Radiology    No results found.  Cardiac Studies   Echocardiogram 01/12/2022   1. Left ventricular ejection fraction, by estimation, is 45 to 50%. The  left ventricle has mildly decreased function. The left ventricle  demonstrates global hypokinesis. The left ventricular internal cavity size  was mildly dilated. Left ventricular  diastolic function could not  be evaluated.   2. Right ventricular systolic function is normal.   3. Moderate pleural effusion in the left lateral region.   4. The mitral valve is normal in structure.   5. The aortic valve is normal in structure. Aortic valve regurgitation is  not visualized.   Conclusion(s)/Recommendation(s): Poor windows for evaluation of left  ventricular function by transthoracic echocardiography. Would recommend an alternative means of evaluation.    Left Heart Catheterization 02/07/2022      Ost LM to Ost LAD lesion is 95% stenosed.   Prox LAD to Mid LAD lesion is 100% stenosed.   Mid LAD lesion is 100% stenosed.   Mid RCA lesion is 50% stenosed.   Ost RCA lesion is 80% stenosed.   Origin lesion is 100% stenosed.   Prox Cx lesion is 80% stenosed.   Post intervention, there is a 0% residual stenosis.   1.  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 2.  Underexpanded ostial right coronary artery stent due to surrounding calcium with a pre-Shockwave lithotripsy area of 3.72 mm and a post shockwave area of 6.56 mm; no additional stents were implanted. 3.  LVEDP of 20 mmHg.   Commendation: Dual antiplatelet therapy for at least 1 year. Diagnostic Dominance: Right  Intervention     Patient Profile     81 y.o. female with a hx of CAD s/p CABG in 2018, ICM , CKD-3, HLD, DM-2, VF arrest, GERD admitted with NSTEMI, recurrent episodes of acute decompensation of chronic by systolic and diastolic heart failure, paroxysmal atrial fibrillation, mitral insufficiency underwent revascularization for proximal right coronary artery restenosis 02/07/2022 (angioplasty/shockwave lithotripsy, no new stents)  Assessment & Plan    Acute on Chronic Systolic Heart Failure  - Patient presented on 8/15 with SOB, orthopnea. BNP elevated to 2700. CXR With moderate-lage bilateral pleural effusions with bibasilar collapse or consolidation. Note, this is her 5th admission this  summer with acute CHF/NSTEMI, one admission was complicated by VF arrest  - She  underwent LHC on 8/16 with shockwave lithotripsy to RCA  - Patient was started on IV diuretics. On 8/18, patient's respiratory status worsened and she required bi-pap. Concern that patient developed flash pulmonary edema in the setting of high BP - Now has been diuresing on IV lasix 80 mg BID- output at least 0.95 L urine yesterday (overnight I/Os not recorded). Renal function stable - Patient continues to have crackles in lungs, but it otherwise euvolemic on exam. Transition to PO lasix 80 mg daily today   - Continue jardiance 10 mg daily, metoprolol 25 mg daily, spironolactone 25 mg daily  - Palliative care consulted-- patient has been referred to hospice, patient will be discharged with comfort medications    HTN - Continue metoprolol 25 mg daily, spironolactone 25 mg daily     PVCs  - Per telemetry, patient is in NSR with frequent PVCs  - On metoprolol  - Maintain K>4, mag>2    CAD  - LHC this admission as above  - Continue DAPT with ASA, clopidgrel for at least 1 year (theoretically could stop after 30 days, but patient has complex coronary anatomy and would at least be on clopidogrel long term) - Continue metoprolol, lipitor      For questions or updates, please contact [facility] Please consult [email or web address] for contact info under        Signed, Jonita Albee, PA-C  02/14/2022, 10:17 AM    Patient seen and examined with KJ PA-C.  Agree as above, with the following exceptions and changes as noted below.  Patient is sleeping for my exam, discussed her care with her daughter at the bedside. Gen: NAD, CV: RRR, Lungs: No increased work of breathing, extremities: No edema, Neuro/Psych: Sleeping all available labs, radiology testing, previous records reviewed.  Patient plans to go home with hospice as soon as necessary steps are in place.  Eventually she would like to go home to [place] with her  daughter.  Reasonable to transition to oral Lasix today as noted above she has diuresed well but I suspect another episode of flash pulmonary edema may be in her future.  Fortunately she appears euvolemic today. Cardiology will sign off at this time, please contact our service with questions or concerns.  Parke Poisson, MD 02/14/22 11:46 AM

## 2022-02-14 NOTE — Progress Notes (Signed)
Heart Failure Navigator Progress Note  Assessed for Heart & Vascular TOC clinic readiness.  Patient will now go home with Hospice, will cancel her HF TOC appointment for 02/20/22. Rhae Hammock, BSN, RN Heart Failure Teacher, adult education Only

## 2022-02-14 NOTE — Assessment & Plan Note (Addendum)
Acute pulmonary edema has resolved.

## 2022-02-14 NOTE — Assessment & Plan Note (Addendum)
Her glucose remained stable, she was treated with insulin sliding scale during her hospitalization.  Hyperlipidemia, continue with statin therapy.

## 2022-02-15 DIAGNOSIS — I5043 Acute on chronic combined systolic (congestive) and diastolic (congestive) heart failure: Secondary | ICD-10-CM | POA: Diagnosis not present

## 2022-02-15 DIAGNOSIS — I25110 Atherosclerotic heart disease of native coronary artery with unstable angina pectoris: Secondary | ICD-10-CM | POA: Diagnosis not present

## 2022-02-15 DIAGNOSIS — N1831 Chronic kidney disease, stage 3a: Secondary | ICD-10-CM | POA: Diagnosis not present

## 2022-02-15 DIAGNOSIS — J9621 Acute and chronic respiratory failure with hypoxia: Secondary | ICD-10-CM | POA: Diagnosis not present

## 2022-02-15 MED ORDER — ENOXAPARIN SODIUM 40 MG/0.4ML IJ SOSY
40.0000 mg | PREFILLED_SYRINGE | INTRAMUSCULAR | Status: DC
  Administered 2022-02-15: 40 mg via SUBCUTANEOUS
  Filled 2022-02-15 (×2): qty 0.4

## 2022-02-15 MED ORDER — POTASSIUM CHLORIDE CRYS ER 20 MEQ PO TBCR
40.0000 meq | EXTENDED_RELEASE_TABLET | Freq: Once | ORAL | Status: AC
  Administered 2022-02-15: 40 meq via ORAL
  Filled 2022-02-15: qty 2

## 2022-02-15 NOTE — Care Management Important Message (Signed)
Important Message  Patient Details  Name: Lindsey Pollard MRN: [id number] Date of Birth: June 13, 1941   Medicare Important Message Given:  Yes     Renie Ora 02/15/2022, 10:49 AM

## 2022-02-15 NOTE — Progress Notes (Signed)
[name]      DOB: 09/25/1940      KQA:[id number]      Palliative Medicine Team    Subjective: Bedside symptom check completed. No family or visitors bedside at time of visit.   Physical exam: Patient sitting up in bed eating breakfast at time of visit. Breathing even and non-labored with nasal cannula applied, no excessive secretions noted. Patient without physical or non-verbal signs of pain or discomfort at this time.    Assessment and plan: Patient endorses she is feeling pretty good, just a slight lingering headache she has received tylenol for. She is waiting for DME to be delivered and set up in the home prior to discharge home with hospice support. She has no other needs or concerns at this time. Bedside RN without needs or concerns this morning. Will continue to follow for any changes or advances.    Thank you for allowing the Palliative Medicine Team to assist in the care of this patient.     Shelda Jakes, MSN, RN Palliative Medicine Team Team Phone: [phone]  This phone is monitored 7a-7p, please reach out to attending physician outside of these hours for urgent needs.

## 2022-02-15 NOTE — Progress Notes (Signed)
PT Cancellation Note  Patient Details Name: Lindsey Pollard MRN: [id number] DOB: 1941-01-20   Cancelled Treatment:    Reason Eval/Treat Not Completed: Fatigue limiting ability to participate. Pt reported she didn't get sleep last night and needs to rest. Pt is now planning on return home with hospice and daughter.   Angelina Ok [facility] 02/15/2022, 11:29 AM Skip Mayer PT Acute Rehabilitation Services Office [phone]

## 2022-02-15 NOTE — Progress Notes (Addendum)
Progress Note   Patient: Lindsey Pollard:[id number] DOB: 10-Sep-1940 DOA: 02/06/2022     9 DOS: the patient was seen and examined on 02/15/2022   Brief hospital course: Lindsey Pollard was admitted to the hospital with the working diagnosis of heart failure.   81 yo female with the past medical history of hypertension, dyslipidemia, heart failure, coronary artery disease, CKD and T2DM who presented with respiratory distress. Recent hospitalization for heart failure 08/07 to 02/03/22, discharged to SNF. Reported recurrent dyspnea and orthopnea, EMS was called and patient was found hypoxie 86% on 4 L per [place], she was placed on Cpap and transported to the ED.  On her initial physical examination her blood pressure was 146/50, HR 60 and RR 21 with 02 saturation 100%, on bipap. Lungs with rales but no wheezing, heart with S1 and S2 present and rhythmic, abdomen not distended and no lower extremity edema.   Na 134, K 3,7 Cl 89, bicarbonate 32, glucose 272 bun 26 cr 1,0 BNP 2,765  High sensitive troponin 46, 145 , 621, 833  Wbc 16, hgb 9,6 plt 336   Chest radiograph with bilateral interstitial infiltrates with cephalization of the vasculature and bilateral pleural effusions, more right than left.   EKG 98 bpm, normal axis, normal intervals, sinus rhythm with V4 to V6 ST depression with no T wave changes.   Patient placed on IV furosemide IV heparin for NSTEMI.   08/16 cardiac catheterization with significant coronary artery disease. Patent LIMA to LAD. ISR in the ostial RCA, treated with shockwave lithotripsy, no additional stents.   08/18 recurrent flash pulmonary edema.  Palliative care has been consulted and plan for home hospice.   08/24 patient transitioned to oral diuretic therapy with good toleration.   Assessment and Plan: * Acute on chronic combined systolic (congestive) and diastolic (congestive) heart failure (HCC) Patient with improvement in volume status.   Urine output over last 24  hrs is 1,720 ml, since admission she has a negative fluid balance of 8,963 ml.  Systolic blood pressure 130 to 140 mmHg.  Acute hypoxemic respiratory failure, improved oxygenation. She is not using Bipap. Her 02 saturation is 98% on 2 l/min per [place]   Plan to continue medical therapy with empaglifloozin, furosemide, metoprolol, and spironolactone.  Patient with progressive decline in her health with multiple re-hospitalizations,  Plan for discharge home with hospice services.  If patient continue to be stable today will plan for discharge tomorrow.    Acute on chronic respiratory failure with hypoxia (HCC) Acute pulmonary edema has resolved, plan to continue oxymetry monitoring and supplemental 02 per [place]. She is not requiring Bipap.   CAD (coronary artery disease) NSTEMI. Patient had medical therapy with IV heparin.  Currently with no chest pain. Plan to continue with dual antiplatelet therapy and statin Continue with metoprolol.    Controlled type 2 diabetes mellitus without complication, without long-term current use of insulin (HCC) Continue insulin sliding scale for glucose cover and monitoring.  Her fasting glucose is 114 mg/dl.   Hyperlipidemia, continue with statin therapy.   Chronic kidney disease, stage 3a (HCC) Hyponatremia.  Serum cr is 1,0 with K at 3,5 and serum bicarbonate at 36, Na is 130.  Plan to add 40 meq Kcl today and follow up renal function and electrolytes.  Continue diuresis with spironolactone and furosemide for diuresis.   Anemia due to chronic kidney disease Continue close follow up on cell count.  NO indication for PRBC transfusion.  Cell count stable  at 8,6    Pressure wounds: continue local wound care   Active Pressure Injury/Wound(s)     Pressure Ulcer  Duration          Pressure Injury 01/29/22 Sacrum Mid;Left Stage 2 -  Partial thickness loss of dermis presenting as a shallow open injury with a red, pink wound bed without slough. 16  days   Pressure Injury 02/06/22 Buttocks Medial Stage 1 -  Intact skin with non-blanchable redness of a localized area usually over a bony prominence. 8 days           (Optional):26781}     Subjective: Patient with improvement in her dyspnea, no chest pain  Physical Exam: Vitals:   02/15/22 0455 02/15/22 0751 02/15/22 0958 02/15/22 1100  BP: (!) 148/59 (!) 144/59 (!) 132/42 (!) 144/54  Pulse: 73 72 75 73  Resp: 16 17  17   Temp: 97.9 F (36.6 C) 98.7 F (37.1 C)  98.4 F (36.9 C)  TempSrc: Oral Oral  Oral  SpO2: 98%   98%  Weight:      Height:       Neurology awake and alert ENT with mild pallor Cardiovascular with S1 and S2 present and rhythmic with no gallops, rubs or murmurs No JVD No lower extremity edema Respiratory with no rales or wheezing Abdomen not distended No lower extremity edema  Data Reviewed:    Family Communication: I called her daughter, not able to reach her by phone, I left a message.   Disposition: Status is: Inpatient Remains inpatient appropriate because: heart failure   Planned Discharge Destination: Home    Author: , MD 02/15/2022 11:42 AM  For on call review [email or web address].

## 2022-02-16 ENCOUNTER — Other Ambulatory Visit (HOSPITAL_COMMUNITY): Payer: Self-pay

## 2022-02-16 DIAGNOSIS — I5043 Acute on chronic combined systolic (congestive) and diastolic (congestive) heart failure: Secondary | ICD-10-CM | POA: Diagnosis not present

## 2022-02-16 DIAGNOSIS — J9621 Acute and chronic respiratory failure with hypoxia: Secondary | ICD-10-CM | POA: Diagnosis not present

## 2022-02-16 DIAGNOSIS — E119 Type 2 diabetes mellitus without complications: Secondary | ICD-10-CM | POA: Diagnosis not present

## 2022-02-16 DIAGNOSIS — I25110 Atherosclerotic heart disease of native coronary artery with unstable angina pectoris: Secondary | ICD-10-CM | POA: Diagnosis not present

## 2022-02-16 LAB — BASIC METABOLIC PANEL
Anion gap: 13 (ref 5–15)
BUN: 53 mg/dL — ABNORMAL HIGH (ref 8–23)
CO2: 32 mmol/L (ref 22–32)
Calcium: 9.1 mg/dL (ref 8.9–10.3)
Chloride: 82 mmol/L — ABNORMAL LOW (ref 98–111)
Creatinine, Ser: 1.13 mg/dL — ABNORMAL HIGH (ref 0.44–1.00)
GFR, Estimated: 49 mL/min — ABNORMAL LOW (ref >60)
Glucose, Bld: 182 mg/dL — ABNORMAL HIGH (ref 70–99)
Potassium: 4.1 mmol/L (ref 3.5–5.1)
Sodium: 127 mmol/L — ABNORMAL LOW (ref 135–145)

## 2022-02-16 MED ORDER — SPIRONOLACTONE 25 MG PO TABS
25.0000 mg | ORAL_TABLET | Freq: Every day | ORAL | 0 refills | Status: AC
  Filled 2022-02-16: qty 30, 30d supply, fill #0

## 2022-02-16 MED ORDER — NITROGLYCERIN 0.4 MG SL SUBL
0.4000 mg | SUBLINGUAL_TABLET | SUBLINGUAL | 0 refills | Status: AC | PRN
  Filled 2022-02-16: qty 25, 14d supply, fill #0

## 2022-02-16 MED ORDER — FUROSEMIDE 80 MG PO TABS
80.0000 mg | ORAL_TABLET | Freq: Every day | ORAL | 0 refills | Status: AC
  Filled 2022-02-16: qty 30, 30d supply, fill #0

## 2022-02-16 NOTE — Discharge Summary (Signed)
Physician Discharge Summary   Patient: Lindsey Pollard MRN: [id number] DOB: 1941/03/15  Admit date:     02/06/2022  Discharge date: 02/16/22  Discharge Physician: Jimmy Picket [name]   PCP: [name], MD   Recommendations at discharge:    Patient will continue diuresis at home with furosemide, empagliflozin and spironolactone.  Follow up renal function and electrolytes as outpatient in 7 days.  Follow up with home hospice.   I spoke over the phone with the patient's daughter about patient's  condition, plan of care, prognosis and all questions were addressed.   Discharge Diagnoses: Principal Problem:   Acute on chronic combined systolic (congestive) and diastolic (congestive) heart failure (HCC) Active Problems:   Acute on chronic respiratory failure with hypoxia (HCC)   CAD (coronary artery disease)   Controlled type 2 diabetes mellitus without complication, without long-term current use of insulin (HCC)   Chronic kidney disease, stage 3a ([place])   Anemia due to chronic kidney disease  Resolved Problems:   * No resolved hospital problems. [facility] Course: Lindsey Pollard was admitted to the hospital with the working diagnosis of heart failure.   81 yo female with the past medical history of hypertension, dyslipidemia, heart failure, coronary artery disease, CKD and T2DM who presented with respiratory distress. Recent hospitalization for heart failure 08/07 to 02/03/22, discharged to SNF. Reported recurrent dyspnea and orthopnea, EMS was called and patient was found hypoxic 86% on 4 L per [place], she was placed on Cpap and transported to the ED.  On her initial physical examination her blood pressure was 146/50, HR 60 and RR 21 with 02 saturation 100%, on bipap. Lungs with rales but no wheezing, heart with S1 and S2 present and rhythmic, abdomen not distended and no lower extremity edema.   Na 134, K 3,7 Cl 89, bicarbonate 32, glucose 272 bun 26 cr 1,0 BNP 2,765  High  sensitive troponin 46, 145 , 621, 833  Wbc 16, hgb 9,6 plt 336   Chest radiograph with bilateral interstitial infiltrates with cephalization of the vasculature and bilateral pleural effusions, more right than left.   EKG 98 bpm, normal axis, normal intervals, sinus rhythm with V4 to V6 ST depression with no T wave changes.   Patient placed on IV furosemide IV heparin for NSTEMI.   08/16 cardiac catheterization with significant coronary artery disease. Patent LIMA to LAD. ISR in the ostial RCA, treated with shockwave lithotripsy, no additional stents.   08/18 recurrent flash pulmonary edema, that required further diuresis.  Palliative care has been consulted and plan for home hospice.   08/24 patient transitioned to oral diuretic therapy with good toleration.  08/25 patient has remained stable, plan to dc home today with hospice services.   Assessment and Plan: * Acute on chronic combined systolic (congestive) and diastolic (congestive) heart failure (HCC) Echocardiogram with mild reduction in EF 45 to 50% with global hypokinesis, mild dilated internal cavity. RV systolic function preserved.   Patient was placed on aggressive diuretic therapy with IV furosemide, negative fluid balance was achieved, -9,993 ml, with significant improvement in her symptoms.   Acute hypoxemic respiratory failure, improved oxygenation. She was liberated from non invasive mechanical ventilation. At the time of her discharge hjer 02 saturation was 99% on 2 l/min per [place]   Medical therapy with empaglifloozin, furosemide, metoprolol, and spironolactone.   Patient with progressive decline in her health with multiple re-hospitalizations,  Plan for discharge home with hospice services.    Acute on  chronic respiratory failure with hypoxia (HCC) Acute pulmonary edema has resolved.   CAD (coronary artery disease) NSTEMI. Patient had medical therapy with IV heparin.  Currently with no chest pain. Plan to  continue with dual antiplatelet therapy and statin Continue with metoprolol.    Controlled type 2 diabetes mellitus without complication, without long-term current use of insulin (HCC) Her glucose remained stable, she was treated with insulin sliding scale during her hospitalization.  Hyperlipidemia, continue with statin therapy.   Chronic kidney disease, stage 3a (HCC) Hyponatremia.  Patient's volume status has improved, renal function at the time of her discharge has a serum cr of 1,13 with K at 4,1 and serum bicarbonate at 32. Na 127   Plan to continue diuresis with furosemide, SGLT2 and spironolactone at home. Follow up rena function as outpatient.   Anemia due to chronic kidney disease Her Hgb has been stable, her hgb at the time of her discharge is 8.6    Pressure ulcer stage 2 mid left scrum and stage 1 medial buttocks, present on admission, continue with local wound care.       Consultants: cardiology  Procedures performed: cardiac catheterization    Disposition: Home Diet recommendation:  Cardiac and Carb modified diet DISCHARGE MEDICATION: Allergies as of 02/16/2022       Reactions   Tetanus Toxoids Swelling   Tetanus and diphtheria toxids   Penicillin G Rash        Medication List     TAKE these medications    acetaminophen 325 MG tablet Commonly known as: TYLENOL Take 2 tablets (650 mg total) by mouth every 4 (four) hours as needed for mild pain (temp > 101.5).   albuterol 108 (90 Base) MCG/ACT inhaler Commonly known as: VENTOLIN HFA Inhale 1-2 puffs into the lungs every 4 (four) hours as needed for shortness of breath.   ALPRAZolam 0.25 MG tablet Commonly known as: XANAX Take 0.5 tablets (0.125 mg total) by mouth 3 (three) times daily as needed for anxiety.   aspirin EC 81 MG tablet Take 81 mg by mouth every evening.   atorvastatin 80 MG tablet Commonly known as: LIPITOR Take 80 mg by mouth every evening.   Cholecalciferol 25 MCG (1000 UT)  capsule Take 1,000 Units by mouth 2 (two) times daily.   clopidogrel 75 MG tablet Commonly known as: PLAVIX Take 1 tablet (75 mg total) by mouth daily. What changed: when to take this   docusate sodium 100 MG capsule Commonly known as: COLACE Take 1 capsule (100 mg total) by mouth 2 (two) times daily as needed for mild constipation.   empagliflozin 10 MG Tabs tablet Commonly known as: JARDIANCE Take 1 tablet (10 mg total) by mouth daily.   feeding supplement Liqd Take 237 mLs by mouth 3 (three) times daily between meals.   furosemide 80 MG tablet Commonly known as: LASIX Take 1 tablet (80 mg total) by mouth daily. Start taking on: February 17, 2022 What changed:  medication strength how much to take   ipratropium-albuterol 0.5-2.5 (3) MG/3ML Soln Commonly known as: DUONEB Take 3 mLs by nebulization every 6 (six) hours as needed (shortness of breath).   loperamide 2 MG capsule Commonly known as: IMODIUM Take 1 capsule (2 mg total) by mouth as needed for diarrhea or loose stools. What changed: when to take this   metoprolol succinate 25 MG 24 hr tablet Commonly known as: TOPROL-XL Take 1 tablet (25 mg total) by mouth daily.   multivitamin with minerals Tabs tablet  Take 1 tablet by mouth every evening.   nitroGLYCERIN 0.4 MG SL tablet Commonly known as: NITROSTAT Place 1 tablet (0.4 mg total) under the tongue every 5 (five) minutes as needed for chest pain.   ondansetron 4 MG disintegrating tablet Commonly known as: ZOFRAN-ODT Take 4 mg by mouth every 8 (eight) hours as needed for nausea or vomiting.   pantoprazole 40 MG tablet Commonly known as: PROTONIX Take 1 tablet (40 mg total) by mouth daily.   polyethylene glycol 17 g packet Commonly known as: MIRALAX / GLYCOLAX Take 17 g by mouth daily as needed for moderate constipation.   spironolactone 25 MG tablet Commonly known as: ALDACTONE Take 1 tablet (25 mg total) by mouth daily. Start taking on: February 17, 2022 What changed:  how much to take when to take this               Discharge Care Instructions  (From admission, onward)           Start     Ordered   02/16/22 0000  Discharge wound care:       Comments: Avoid persistent pressure on sacrum and buttocks.   02/16/22 0936            Discharge Exam: Filed Weights   02/06/22 1954 02/07/22 0039 02/09/22 0700  Weight: 55.5 kg 56.8 kg 58.6 kg   BP (!) 151/54 (BP Location: Left Arm)   Pulse 77   Temp 98 F (36.7 C) (Oral)   Resp 18   Ht 4\' 11"  (1.499 m)   Wt 58.6 kg   SpO2 99%   BMI 26.09 kg/m   Patient is feeling better, no dyspnea or chest pain, no lower extremity edema  Neurology awake and alert ENT with mil pallor Cardiovascular with S1 and S2 present and rhythmic with no gallops Respiratory with no rales or wheezing Abdomen with no distention  No lower extremity edema.   Condition at discharge: stable  The results of significant diagnostics from this hospitalization (including imaging, microbiology, ancillary and laboratory) are listed below for reference.   Imaging Studies: DG CHEST PORT 1 VIEW  Result Date: 02/09/2022 CLINICAL DATA:  Hypoxia. EXAM: PORTABLE CHEST 1 VIEW COMPARISON:  02/06/2022 FINDINGS: Examination demonstrates adequate lung volumes with persistent hazy perihilar and bibasilar opacification likely interstitial edema which is unchanged to slightly worse. Stable moderate opacification of the left base likely moderate effusion with associated atelectasis. Slight worsening hazy opacification right base likely effusion with atelectasis. Infection in the mid to lower lungs is possible. Mild stable cardiomegaly. Remainder of the exam is unchanged. IMPRESSION: 1. Stable to slightly worse bilateral perihilar and bibasilar opacification likely interstitial edema with bilateral effusions/atelectasis. Infection in the mid to lower lungs is possible. 2. Stable cardiomegaly. Electronically Signed    By: Marin Olp M.D.   On: 02/09/2022 08:29   CARDIAC CATHETERIZATION  Result Date: 02/07/2022   Colon Flattery LM to [facility] LAD lesion is 95% stenosed.   Prox LAD to Mid LAD lesion is 100% stenosed.   Mid LAD lesion is 100% stenosed.   Mid RCA lesion is 50% stenosed.   Ost RCA lesion is 80% stenosed.   Origin lesion is 100% stenosed.   Prox Cx lesion is 80% stenosed.   Post intervention, there is a 0% residual stenosis. 1.  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 2.  Underexpanded ostial right coronary artery stent due to surrounding calcium with a pre-Shockwave  lithotripsy area of 3.72 mm and a post shockwave area of 6.56 mm; no additional stents were implanted. 3.  LVEDP of 20 mmHg. Commendation: Dual antiplatelet therapy for at least 1 year.   DG Chest Portable 1 View  Result Date: 02/06/2022 CLINICAL DATA:  81 year old female with shortness of breath. EXAM: PORTABLE CHEST 1 VIEW COMPARISON:  Portable chest 01/29/2022 and earlier. FINDINGS: Portable AP semi upright view at 0417 hours. Continued moderate to large bilateral veiling lower lung opacity. Obscured diaphragm as before. No air bronchograms or pneumothorax identified. Upper lung pulmonary vascularity appears stable, mild congestion. Stable cardiac size and mediastinal contours. Previous sternotomy. Paucity of bowel gas in the upper abdomen. No acute osseous abnormality identified. IMPRESSION: 1. Continued moderate to large bilateral pleural effusions with bibasilar collapse or consolidation. 2. Stable pulmonary vascular congestion, probable mild interstitial edema. Electronically Signed   By: Odessa Fleming M.D.   On: 02/06/2022 05:29   DG Chest Portable 1 View  Result Date: 01/29/2022 CLINICAL DATA:  dyspnea EXAM: PORTABLE CHEST 1 VIEW COMPARISON:  January 22, 2022 FINDINGS: Again seen are sternotomy wires and CABG changes. Cardiomediastinal silhouette is prominent, stable. Bilateral pleural effusion greater on the  left. There has been interval significant improvement of the pulmonary edema. There is mild-to-moderate pulmonary vascular congestion seen. The visualized skeletal structures are unremarkable. IMPRESSION: 1. Mild-to-moderate bilateral pleural effusion greater on the left without significant interval change. 2. There has been significant resolution of the pulmonary edema. Presently, there is mild-to-moderate pulmonary vascular congestion seen. Electronically Signed   By: Marjo Bicker M.D.   On: 01/29/2022 10:43   DG Chest Port 1 View  Result Date: 01/22/2022 CLINICAL DATA:  Shortness of breath EXAM: PORTABLE CHEST 1 VIEW COMPARISON:  Chest x-ray dated January 17, 2022 FINDINGS: Cardiac and mediastinal contours are unchanged, status post CABG. Increased left-greater-than-right heterogeneous pulmonary opacities. Moderate left and small right pleural effusions. Evidence of pneumothorax. IMPRESSION: 1. Increased left-greater-than-right heterogeneous pulmonary opacities, likely due to worsened pulmonary edema. 2. Moderate left and small right pleural effusions, similar in size when compared with prior exam. Electronically Signed   By: Allegra Lai M.D.   On: 01/22/2022 12:14   DG Chest Port 1 View  Result Date: 01/17/2022 CLINICAL DATA:  Fever, shortness of breath EXAM: PORTABLE CHEST 1 VIEW COMPARISON:  Previous studies including the examination of 01/15/2022 FINDINGS: Transverse diameter of Edwyna Shell is increased. Central pulmonary vessels are prominent. Increased interstitial markings are seen in the parahilar regions. Small to moderate bilateral pleural effusions are seen, more so on the left side with interval increase. Evaluation of left lower lung field for infiltrates is limited by the effusion. There is no pneumothorax. There is interval removal of endotracheal tube, central venous catheter and enteric tube. IMPRESSION: Central pulmonary vessels are prominent with increased interstitial markings in the  parahilar regions suggesting CHF. Small to moderate bilateral pleural effusions, more so on the left side with interval increase. Electronically Signed   By: Ernie Avena M.D.   On: 01/17/2022 17:38    Microbiology: Results for orders placed or performed during the hospital encounter of 02/06/22  Blood culture (routine x 2)     Status: None   Collection Time: 02/06/22  4:04 AM   Specimen: BLOOD  Result Value Ref Range Status   Specimen Description BLOOD SITE NOT SPECIFIED  Final   Special Requests   Final    BOTTLES DRAWN AEROBIC AND ANAEROBIC Blood Culture results may not be optimal due to an  excessive volume of blood received in culture bottles   Culture   Final    NO GROWTH 5 DAYS Performed at [facility], [place] [address] [place], [place] [address]    Report Status 02/11/2022 FINAL  Final  Blood culture (routine x 2)     Status: None   Collection Time: 02/06/22  4:18 AM   Specimen: BLOOD  Result Value Ref Range Status   Specimen Description BLOOD SITE NOT SPECIFIED  Final   Special Requests   Final    BOTTLES DRAWN AEROBIC AND ANAEROBIC Blood Culture results may not be optimal due to an excessive volume of blood received in culture bottles   Culture   Final    NO GROWTH 5 DAYS Performed at [facility], [place] [address] [place], [place] [address]    Report Status 02/11/2022 FINAL  Final  SARS Coronavirus 2 by RT PCR (hospital order, performed in [facility] hospital lab) *cepheid single result test*     Status: None   Collection Time: 02/06/22  5:16 AM   Specimen: Nasal Swab  Result Value Ref Range Status   SARS Coronavirus 2 by RT PCR NEGATIVE NEGATIVE Final    Comment: (NOTE) SARS-CoV-2 target nucleic acids are NOT DETECTED.  The SARS-CoV-2 RNA is generally detectable in upper and lower respiratory specimens during the acute phase of infection. The lowest concentration of SARS-CoV-2 viral copies this assay can detect is 250 copies / mL. A negative result  does not preclude SARS-CoV-2 infection and should not be used as the sole basis for treatment or other patient management decisions.  A negative result may occur with improper specimen collection / handling, submission of specimen other than nasopharyngeal swab, presence of viral mutation(s) within the areas targeted by this assay, and inadequate number of viral copies (<250 copies / mL). A negative result must be combined with clinical observations, patient history, and epidemiological information.  Fact Sheet for Patients:   https://www.patel.info/  Fact Sheet for Healthcare Providers: https://hall.com/  This test is not yet approved or  cleared by the [place] FDA and has been authorized for detection and/or diagnosis of SARS-CoV-2 by FDA under an Emergency Use Authorization (EUA).  This EUA will remain in effect (meaning this test can be used) for the duration of the COVID-19 declaration under Section 564(b)(1) of the Act, 21 U.S.C. section 360bbb-3(b)(1), unless the authorization is terminated or revoked sooner.  Performed at [facility], [place] [address] [place], [place] [address]   Urine Culture     Status: Abnormal   Collection Time: 02/06/22  8:29 AM   Specimen: Urine, Clean Catch  Result Value Ref Range Status   Specimen Description URINE, CLEAN CATCH  Final   Special Requests NONE  Final   Culture (A)  Final    <10,000 COLONIES/mL INSIGNIFICANT GROWTH Performed at [facility], [place] [address] [place], [place] [address]    Report Status 02/07/2022 FINAL  Final    Labs: CBC: Recent Labs  Lab 02/10/22 0253 02/11/22 0427 02/12/22 0928  WBC 11.7* 13.3* 9.5  HGB 8.7* 8.0* 8.6*  HCT 27.3* 24.4* 27.2*  MCV 93.8 92.1 92.8  PLT 260 227 0000000   Basic Metabolic Panel: Recent Labs  Lab 02/11/22 0427 02/12/22 0928 02/13/22 0341 02/14/22 0357 02/16/22 0431  NA 131* 133* 132* 130* 127*  K 3.9 3.6 3.8 3.5  4.1  CL 89* 84* 85* 80* 82*  CO2 31 37* 37* 36*  32  GLUCOSE 124* 172* 163* 114* 182*  BUN 30* 27* 32* 38* 53*  CREATININE 0.88 1.10* 1.08* 1.04* 1.13*  CALCIUM 8.8* 9.3 9.0 9.5 9.1   Liver Function Tests: No results for input(s): "AST", "ALT", "ALKPHOS", "BILITOT", "PROT", "ALBUMIN" in the last 168 hours. CBG: No results for input(s): "GLUCAP" in the last 168 hours.  Discharge time spent: greater than 30 minutes.  Signed: Coralie Keens, MD [facility] 02/16/2022

## 2022-02-16 NOTE — Discharge Instructions (Signed)
   Patient will continue diuresis at home with furosemide, empagliflozin and spironolactone.  Follow up renal function and electrolytes as outpatient in 7 days.  Follow up with home hospice.

## 2022-02-16 NOTE — TOC Transition Note (Signed)
Transition of Care [facility]) - CM/SW Discharge Note   Patient Details  Name: Lindsey Pollard MRN: [id number] Date of Birth: 1940/11/17  Transition of Care [facility]) CM/SW Contact:  Leone Haven, RN Phone Number: 02/16/2022, 11:04 AM   Clinical Narrative:    Patient is for dc home with hospice today with [facility], NCM notified [name] , patient's daughter that ptar has been scheduled and also notified Shanita with [facility].     Final next level of care: Home w Home Health Services Barriers to Discharge: No Barriers Identified   Patient Goals and CMS Choice Patient states their goals for this hospitalization and ongoing recovery are:: daughter wants patient to return home with Hospice Services. CMS Medicare.gov Compare Post Acute Care list provided to:: Patient Choice offered to / list presented to : Adult Children  Discharge Placement                       Discharge Plan and Services In-house Referral: Clinical Social Work Discharge Planning Services: CM Consult Post Acute Care Choice: Hospice          DME Arranged: Hospital bed, Overbed table, Oxygen, Bedside commode [facility]) DME Agency: [facility] [facility]) Date DME Agency Contacted: 02/13/22 Time DME Agency Contacted: 1200 Representative spoke with at DME Agency: Danford Bad HH Arranged: RN [facility] Agency: [facility] [place] [facility] Date [facility] Agency Contacted: 02/13/22 Time HH Agency Contacted: 1200 Representative spoke with at [facility] Agency: Danford Bad  Social Determinants of Health (SDOH) Interventions Food Insecurity Interventions: Intervention Not Indicated Financial Strain Interventions: Intervention Not Indicated Housing Interventions: Intervention Not Indicated Transportation Interventions: Intervention Not Indicated   Readmission Risk Interventions    02/07/2022   11:31 AM 01/18/2022    9:04 PM 01/05/2022    5:09  PM  Readmission Risk Prevention Plan  Transportation Screening Complete Complete Complete  PCP or Specialist Appt within 3-5 Days   Complete  Social Work Consult for Recovery Care Planning/Counseling   Not Complete  SW consult not completed comments   NA  Palliative Care Screening   Not Applicable  Medication Review [employer]) Complete Referral to Pharmacy Referral to Pharmacy  PCP or Specialist appointment within 3-5 days of discharge  Complete   HRI or Home Care Consult Complete Complete   SW Recovery Care/Counseling Consult Complete Not Complete   SW Consult Not Complete Comments  NA   Palliative Care Screening Not Applicable Not Applicable   Skilled Nursing Facility Complete Complete

## 2022-02-20 ENCOUNTER — Encounter (HOSPITAL_COMMUNITY): Payer: Medicare Other

## 2022-04-23 ENCOUNTER — Encounter (INDEPENDENT_AMBULATORY_CARE_PROVIDER_SITE_OTHER): Payer: Self-pay

## 2022-09-24 DEATH — deceased

## 2022-10-18 ENCOUNTER — Ambulatory Visit (INDEPENDENT_AMBULATORY_CARE_PROVIDER_SITE_OTHER): Payer: Medicare Other | Admitting: Vascular Surgery

## 2022-10-18 ENCOUNTER — Encounter (INDEPENDENT_AMBULATORY_CARE_PROVIDER_SITE_OTHER): Payer: Self-pay

## 2022-10-18 ENCOUNTER — Encounter (INDEPENDENT_AMBULATORY_CARE_PROVIDER_SITE_OTHER): Payer: Medicare Other
